# Patient Record
Sex: Female | Born: 1945
Health system: Southern US, Community
[De-identification: ages and names within clinical notes are randomized; demographics above are authoritative.]

## PROBLEM LIST (undated history)

## (undated) DIAGNOSIS — M1990 Unspecified osteoarthritis, unspecified site: Secondary | ICD-10-CM

## (undated) DIAGNOSIS — R112 Nausea with vomiting, unspecified: Secondary | ICD-10-CM

## (undated) DIAGNOSIS — C189 Malignant neoplasm of colon, unspecified: Secondary | ICD-10-CM

## (undated) DIAGNOSIS — Z9889 Other specified postprocedural states: Secondary | ICD-10-CM

## (undated) DIAGNOSIS — M5432 Sciatica, left side: Secondary | ICD-10-CM

## (undated) DIAGNOSIS — M5126 Other intervertebral disc displacement, lumbar region: Secondary | ICD-10-CM

## (undated) HISTORY — DX: Malignant neoplasm of colon, unspecified: C18.9

## (undated) HISTORY — PX: OTHER SURGICAL HISTORY: SHX169

## (undated) HISTORY — DX: Other intervertebral disc displacement, lumbar region: M51.26

## (undated) HISTORY — DX: Sciatica, left side: M54.32

---

## 1999-06-24 ENCOUNTER — Other Ambulatory Visit: Admission: RE | Admit: 1999-06-24 | Discharge: 1999-06-24 | Payer: Self-pay | Admitting: Family Medicine

## 1999-10-31 ENCOUNTER — Encounter: Payer: Self-pay | Admitting: Family Medicine

## 1999-10-31 ENCOUNTER — Encounter: Admission: RE | Admit: 1999-10-31 | Discharge: 1999-10-31 | Payer: Self-pay | Admitting: Family Medicine

## 2002-12-15 ENCOUNTER — Encounter: Payer: Self-pay | Admitting: Family Medicine

## 2002-12-15 ENCOUNTER — Ambulatory Visit (HOSPITAL_COMMUNITY): Admission: RE | Admit: 2002-12-15 | Discharge: 2002-12-15 | Payer: Self-pay | Admitting: Family Medicine

## 2004-01-04 ENCOUNTER — Ambulatory Visit (HOSPITAL_COMMUNITY): Admission: RE | Admit: 2004-01-04 | Discharge: 2004-01-04 | Payer: Self-pay | Admitting: Family Medicine

## 2007-02-13 ENCOUNTER — Ambulatory Visit: Payer: Self-pay | Admitting: Orthopedic Surgery

## 2007-02-19 ENCOUNTER — Encounter (HOSPITAL_COMMUNITY): Admission: RE | Admit: 2007-02-19 | Discharge: 2007-03-21 | Payer: Self-pay | Admitting: Orthopedic Surgery

## 2007-03-06 ENCOUNTER — Ambulatory Visit: Payer: Self-pay | Admitting: Orthopedic Surgery

## 2007-03-22 ENCOUNTER — Encounter (HOSPITAL_COMMUNITY): Admission: RE | Admit: 2007-03-22 | Discharge: 2007-04-09 | Payer: Self-pay | Admitting: Orthopedic Surgery

## 2011-12-21 DIAGNOSIS — H2510 Age-related nuclear cataract, unspecified eye: Secondary | ICD-10-CM | POA: Diagnosis not present

## 2012-08-27 DIAGNOSIS — R110 Nausea: Secondary | ICD-10-CM | POA: Diagnosis not present

## 2012-08-27 DIAGNOSIS — Z79899 Other long term (current) drug therapy: Secondary | ICD-10-CM | POA: Diagnosis not present

## 2012-08-27 DIAGNOSIS — S199XXA Unspecified injury of neck, initial encounter: Secondary | ICD-10-CM | POA: Diagnosis not present

## 2012-08-27 DIAGNOSIS — R51 Headache: Secondary | ICD-10-CM | POA: Diagnosis not present

## 2012-08-27 DIAGNOSIS — S0990XA Unspecified injury of head, initial encounter: Secondary | ICD-10-CM | POA: Diagnosis not present

## 2013-07-22 DIAGNOSIS — H10509 Unspecified blepharoconjunctivitis, unspecified eye: Secondary | ICD-10-CM | POA: Diagnosis not present

## 2013-07-22 DIAGNOSIS — H2510 Age-related nuclear cataract, unspecified eye: Secondary | ICD-10-CM | POA: Diagnosis not present

## 2013-11-11 ENCOUNTER — Other Ambulatory Visit (HOSPITAL_COMMUNITY): Payer: Self-pay | Admitting: Internal Medicine

## 2013-11-11 DIAGNOSIS — R109 Unspecified abdominal pain: Secondary | ICD-10-CM | POA: Diagnosis not present

## 2013-11-11 DIAGNOSIS — R1032 Left lower quadrant pain: Secondary | ICD-10-CM | POA: Diagnosis not present

## 2013-11-11 DIAGNOSIS — Z79899 Other long term (current) drug therapy: Secondary | ICD-10-CM | POA: Diagnosis not present

## 2013-11-11 DIAGNOSIS — R3129 Other microscopic hematuria: Secondary | ICD-10-CM | POA: Diagnosis not present

## 2013-11-11 DIAGNOSIS — Z0000 Encounter for general adult medical examination without abnormal findings: Secondary | ICD-10-CM

## 2013-11-11 DIAGNOSIS — Z6827 Body mass index (BMI) 27.0-27.9, adult: Secondary | ICD-10-CM | POA: Diagnosis not present

## 2013-11-13 ENCOUNTER — Other Ambulatory Visit (HOSPITAL_COMMUNITY): Payer: Self-pay | Admitting: Internal Medicine

## 2013-11-13 DIAGNOSIS — M810 Age-related osteoporosis without current pathological fracture: Secondary | ICD-10-CM

## 2013-11-18 ENCOUNTER — Ambulatory Visit (HOSPITAL_COMMUNITY)
Admission: RE | Admit: 2013-11-18 | Discharge: 2013-11-18 | Disposition: A | Discharge status: Home / Self Care | Payer: Medicare Other | Source: Ambulatory Visit | Attending: Internal Medicine | Admitting: Internal Medicine

## 2013-11-18 DIAGNOSIS — M810 Age-related osteoporosis without current pathological fracture: Secondary | ICD-10-CM

## 2013-11-18 DIAGNOSIS — Z780 Asymptomatic menopausal state: Secondary | ICD-10-CM | POA: Diagnosis not present

## 2013-11-18 DIAGNOSIS — Z13820 Encounter for screening for osteoporosis: Secondary | ICD-10-CM | POA: Diagnosis not present

## 2013-11-19 DIAGNOSIS — H6120 Impacted cerumen, unspecified ear: Secondary | ICD-10-CM | POA: Diagnosis not present

## 2013-11-26 ENCOUNTER — Other Ambulatory Visit (HOSPITAL_COMMUNITY): Payer: Self-pay | Admitting: Internal Medicine

## 2013-11-26 DIAGNOSIS — R1032 Left lower quadrant pain: Secondary | ICD-10-CM

## 2013-11-26 DIAGNOSIS — R109 Unspecified abdominal pain: Secondary | ICD-10-CM

## 2013-11-26 DIAGNOSIS — R194 Change in bowel habit: Secondary | ICD-10-CM

## 2013-11-28 ENCOUNTER — Encounter (INDEPENDENT_AMBULATORY_CARE_PROVIDER_SITE_OTHER): Payer: Self-pay | Admitting: *Deleted

## 2013-11-28 ENCOUNTER — Other Ambulatory Visit (HOSPITAL_COMMUNITY): Payer: Medicare Other

## 2013-12-02 ENCOUNTER — Ambulatory Visit (HOSPITAL_COMMUNITY)
Admission: RE | Admit: 2013-12-02 | Discharge: 2013-12-02 | Disposition: A | Discharge status: Home / Self Care | Payer: Medicare Other | Source: Ambulatory Visit | Attending: Internal Medicine | Admitting: Internal Medicine

## 2013-12-02 ENCOUNTER — Other Ambulatory Visit (HOSPITAL_COMMUNITY): Payer: Self-pay | Admitting: Internal Medicine

## 2013-12-02 ENCOUNTER — Encounter (HOSPITAL_COMMUNITY): Payer: Self-pay

## 2013-12-02 DIAGNOSIS — N289 Disorder of kidney and ureter, unspecified: Secondary | ICD-10-CM

## 2013-12-02 DIAGNOSIS — R1032 Left lower quadrant pain: Secondary | ICD-10-CM

## 2013-12-02 DIAGNOSIS — Q676 Pectus excavatum: Secondary | ICD-10-CM | POA: Diagnosis not present

## 2013-12-02 DIAGNOSIS — R194 Change in bowel habit: Secondary | ICD-10-CM

## 2013-12-02 DIAGNOSIS — K8689 Other specified diseases of pancreas: Secondary | ICD-10-CM

## 2013-12-02 DIAGNOSIS — R109 Unspecified abdominal pain: Secondary | ICD-10-CM

## 2013-12-02 DIAGNOSIS — R911 Solitary pulmonary nodule: Secondary | ICD-10-CM | POA: Insufficient documentation

## 2013-12-02 DIAGNOSIS — N2889 Other specified disorders of kidney and ureter: Secondary | ICD-10-CM | POA: Diagnosis not present

## 2013-12-02 MED ORDER — IOHEXOL 300 MG/ML  SOLN
100.0000 mL | Freq: Once | INTRAMUSCULAR | Status: AC | PRN
  Administered 2013-12-02: 100 mL via INTRAVENOUS

## 2013-12-05 ENCOUNTER — Encounter (HOSPITAL_COMMUNITY): Payer: Self-pay

## 2013-12-05 ENCOUNTER — Ambulatory Visit (HOSPITAL_COMMUNITY)
Admission: RE | Admit: 2013-12-05 | Discharge: 2013-12-05 | Disposition: A | Discharge status: Home / Self Care | Payer: Medicare Other | Source: Ambulatory Visit | Attending: Internal Medicine | Admitting: Internal Medicine

## 2013-12-05 DIAGNOSIS — K862 Cyst of pancreas: Secondary | ICD-10-CM | POA: Diagnosis not present

## 2013-12-05 DIAGNOSIS — N289 Disorder of kidney and ureter, unspecified: Secondary | ICD-10-CM | POA: Diagnosis not present

## 2013-12-05 DIAGNOSIS — K8689 Other specified diseases of pancreas: Secondary | ICD-10-CM | POA: Diagnosis not present

## 2013-12-05 DIAGNOSIS — K863 Pseudocyst of pancreas: Secondary | ICD-10-CM | POA: Diagnosis not present

## 2013-12-05 DIAGNOSIS — K869 Disease of pancreas, unspecified: Secondary | ICD-10-CM | POA: Diagnosis not present

## 2013-12-05 MED ORDER — GADOBENATE DIMEGLUMINE 529 MG/ML IV SOLN
15.0000 mL | Freq: Once | INTRAVENOUS | Status: AC | PRN
  Administered 2013-12-05: 15 mL via INTRAVENOUS

## 2013-12-08 ENCOUNTER — Encounter (HOSPITAL_COMMUNITY): Payer: Self-pay | Admitting: Pharmacy Technician

## 2013-12-08 ENCOUNTER — Ambulatory Visit (INDEPENDENT_AMBULATORY_CARE_PROVIDER_SITE_OTHER): Payer: Medicare Other | Admitting: Internal Medicine

## 2013-12-08 ENCOUNTER — Telehealth (INDEPENDENT_AMBULATORY_CARE_PROVIDER_SITE_OTHER): Payer: Self-pay | Admitting: *Deleted

## 2013-12-08 ENCOUNTER — Other Ambulatory Visit (INDEPENDENT_AMBULATORY_CARE_PROVIDER_SITE_OTHER): Payer: Self-pay | Admitting: *Deleted

## 2013-12-08 ENCOUNTER — Encounter (INDEPENDENT_AMBULATORY_CARE_PROVIDER_SITE_OTHER): Payer: Self-pay | Admitting: Internal Medicine

## 2013-12-08 VITALS — BP 136/62 | HR 72 | Temp 98.0°F | Ht 64.0 in | Wt 160.4 lb

## 2013-12-08 DIAGNOSIS — K869 Disease of pancreas, unspecified: Secondary | ICD-10-CM | POA: Diagnosis not present

## 2013-12-08 DIAGNOSIS — K8689 Other specified diseases of pancreas: Secondary | ICD-10-CM | POA: Insufficient documentation

## 2013-12-08 DIAGNOSIS — K6389 Other specified diseases of intestine: Secondary | ICD-10-CM | POA: Diagnosis not present

## 2013-12-08 MED ORDER — PEG 3350-KCL-NA BICARB-NACL 420 G PO SOLR: 4000.0000 mL | Freq: Once | ORAL | Status: DC

## 2013-12-08 NOTE — Patient Instructions (Signed)
Colonoscopy and EUS with FNA.    The risk of the colonoscopy were reviewed with patient and she verbalizes and understanding.

## 2013-12-08 NOTE — Progress Notes (Addendum)
Subjective:     Patient ID: Amanda Norris, female   DOB: 1945/11/10, 68 y.o.   MRN: [id number]  HPI Referred to our office by Dr. Gerarda Fraction [facility]) for change in stool and abnormal CT of the abdomen. She tells me she had a change in bowel habits. She has not been regular.  She sometimes had a hard time having a BM and sometimes she will have diarrhea. She thinks she has lost about 8 pounds over the past 6 weeks. She has not seen any rectal bleeding.  Appetite is okay. She says she doesn't eat as much as she use to because of the bloating. She has never undergone a colonoscopy. No family hx of colon cancer.  11/26/2013 CT abdomen/pelvis with CM: IMPRESSION:  Area concerning for neoplasm in the proximal transverse colon. In  this area, there is focal wall thickening with what appears to be  some mucosal disruption. This area measures 6.2 x 4.3 cm in length.  Direct visualization is strongly advised. Note that there is some  subtle mesenteric thickening in this area; spread of tumor to the  immediate adjacent mesenteric region is suspected.  Questionable mass versus inflammation in the head of the pancreas.  MR of the pancreas with and without intravenous contrast advised to  further evaluate this region.  Subcentimeter mass in mid right kidney. This area should also be  evaluated at the time of MR of the pancreas with without intravenous  contrast material administration.  Small nodular lesions in the lung bases. Followup of these nodular  opacity should be based on Fleischner Society guidelines. If the  patient is at high risk for bronchogenic carcinoma, follow-up chest  CT at 1 year is recommended. If the patient is at low risk, no  follow-up is needed. This recommendation follows the consensus  statement: Guidelines for Management of Small Pulmonary Nodules  Detected on CT Scans: A Statement from the [place] as  published in Radiology 2005; 202:542-706.    12/02/2013  MR abdomen w/wo CM: IMPRESSION:  18 x 9 mm cystic lesion in the posterior pancreatic head appears to  communicate with the main pancreatic duct. Side branch IPMN would be  a distinct consideration. Immediately anterior to this nonenhancing  cystic lesion is a 10 x 23 mm subtle area of altered signal and hypo  enhancement within the pancreatic head. . If the patient is to have  PET-CT for the colonic lesion, attention to this area on PET imaging  would be recommended. If the patient is not to have PET imaging, the  EUS could be considered. Alternatively, followup MRI in 3 months  could be used to assess stability.  11/11/2013 H and H 12.5 and 38.8, MCV 80.3, platelets 266. ALP 81, AST 20, ALT 12, Biulirubin ,total 0.4. Review of Systems History reviewed. No pertinent past medical history.  Past Surgical History  Procedure Laterality Date  . Herniated disc      1996    Allergies  Allergen Reactions  . Sulfa Antibiotics     No current outpatient prescriptions on file prior to visit.   No current facility-administered medications on file prior to visit.   Married, Three children inin good health.     Objective:   Physical Exam  Filed Vitals:   12/08/13 0922  BP: 136/62  Pulse: 72  Temp: 98 F (36.7 C)  Height: 5\' 4"  (1.626 m)  Weight: 160 lb 6.4 oz (72.757 kg)   Alert and oriented. Skin warm  and dry. Oral mucosa is moist.   . Sclera anicteric, conjunctivae is pink. Thyroid not enlarged. No cervical lymphadenopathy. Lungs clear. Heart regular rate and rhythm.  Abdomen is soft. Bowel sounds are positive. No hepatomegaly. No abdominal masses felt Slight  Tenderness mid-abdomen.  No edema to lower extremities.       Assessment:    Colonic mass. Colonic neoplasm needs to be ruled. Cystic lesion in pancreatic head. Pancreatic cancer needs to be ruled out. Dr. Laural Golden in with patient.     Plan:     Colonoscopy this week. EUS with FNA with Dr. Ardis Hughs.

## 2013-12-08 NOTE — Telephone Encounter (Signed)
Patient needs trilyte 

## 2013-12-09 ENCOUNTER — Telehealth: Payer: Self-pay

## 2013-12-09 DIAGNOSIS — R932 Abnormal findings on diagnostic imaging of liver and biliary tract: Secondary | ICD-10-CM

## 2013-12-09 NOTE — Telephone Encounter (Signed)
12/18/13 

## 2013-12-09 NOTE — Telephone Encounter (Signed)
Message copied by Barron Alvine on Tue Dec 09, 2013  3:03 PM ------      Message from: Milus Banister      Created: Tue Dec 09, 2013  2:48 PM      Regarding: RE: EUS w/ FNA       Monasia Lair,            She needs upper EUS, radial +/- linear, next Thursday ++MAC for abnormal pancreas.            Can you also have patient get labs (here or at [facility]); CEA and CA 19-9. This week.            Thanks                        ----- Message -----         From: Barron Alvine, CMA         Sent: 12/09/2013   8:23 AM           To: Milus Banister, MD      Subject: FW: EUS w/ FNA                                                       ----- Message -----         From: Larina Bras, CMA         Sent: 12/09/2013           To: Barron Alvine, CMA      Subject: FW: EUS w/ FNA                                                       ----- Message -----         From: Worthy Keeler         Sent: 12/08/2013   9:51 AM           To: Barron Alvine, CMA      Subject: EUS w/ FNA                                               Dr Laural Golden wants patient to have EUS w/ FNA next week, pancreatic mass. Her call# is best number to reach her at: [phone]            ThanksLelon Frohlich       ------

## 2013-12-10 ENCOUNTER — Other Ambulatory Visit: Payer: Self-pay

## 2013-12-10 ENCOUNTER — Encounter (HOSPITAL_COMMUNITY): Payer: Self-pay | Admitting: Pharmacy Technician

## 2013-12-10 ENCOUNTER — Encounter (HOSPITAL_COMMUNITY): Payer: Self-pay | Admitting: *Deleted

## 2013-12-10 DIAGNOSIS — R932 Abnormal findings on diagnostic imaging of liver and biliary tract: Secondary | ICD-10-CM

## 2013-12-10 NOTE — Telephone Encounter (Signed)
Spoke with pt and she is aware.

## 2013-12-10 NOTE — Telephone Encounter (Signed)
EUS for 12/18/13 [facility] 1215 pm , labs are in EPIC as well Left message on machine to call back

## 2013-12-11 ENCOUNTER — Other Ambulatory Visit: Payer: Medicare Other

## 2013-12-11 DIAGNOSIS — R932 Abnormal findings on diagnostic imaging of liver and biliary tract: Secondary | ICD-10-CM | POA: Diagnosis not present

## 2013-12-12 ENCOUNTER — Encounter (HOSPITAL_COMMUNITY): Payer: Self-pay

## 2013-12-12 ENCOUNTER — Ambulatory Visit (HOSPITAL_COMMUNITY)
Admission: RE | Admit: 2013-12-12 | Discharge: 2013-12-12 | Disposition: A | Discharge status: Home / Self Care | Payer: Medicare Other | Source: Ambulatory Visit | Attending: Internal Medicine | Admitting: Internal Medicine

## 2013-12-12 ENCOUNTER — Encounter (HOSPITAL_COMMUNITY)
Admission: RE | Disposition: A | Discharge status: Home / Self Care | Payer: Self-pay | Source: Ambulatory Visit | Attending: Internal Medicine

## 2013-12-12 DIAGNOSIS — D126 Benign neoplasm of colon, unspecified: Secondary | ICD-10-CM | POA: Insufficient documentation

## 2013-12-12 DIAGNOSIS — R933 Abnormal findings on diagnostic imaging of other parts of digestive tract: Secondary | ICD-10-CM | POA: Diagnosis not present

## 2013-12-12 DIAGNOSIS — Z87891 Personal history of nicotine dependence: Secondary | ICD-10-CM | POA: Insufficient documentation

## 2013-12-12 DIAGNOSIS — Z79899 Other long term (current) drug therapy: Secondary | ICD-10-CM | POA: Diagnosis not present

## 2013-12-12 DIAGNOSIS — Z882 Allergy status to sulfonamides status: Secondary | ICD-10-CM | POA: Insufficient documentation

## 2013-12-12 DIAGNOSIS — K5730 Diverticulosis of large intestine without perforation or abscess without bleeding: Secondary | ICD-10-CM | POA: Diagnosis not present

## 2013-12-12 DIAGNOSIS — M129 Arthropathy, unspecified: Secondary | ICD-10-CM | POA: Diagnosis not present

## 2013-12-12 DIAGNOSIS — C184 Malignant neoplasm of transverse colon: Secondary | ICD-10-CM | POA: Insufficient documentation

## 2013-12-12 DIAGNOSIS — K6389 Other specified diseases of intestine: Secondary | ICD-10-CM

## 2013-12-12 DIAGNOSIS — R198 Other specified symptoms and signs involving the digestive system and abdomen: Secondary | ICD-10-CM

## 2013-12-12 HISTORY — PX: COLONOSCOPY: SHX5424

## 2013-12-12 LAB — CEA: CEA: 3.8 ng/mL (ref 0.0–5.0)

## 2013-12-12 LAB — CANCER ANTIGEN 19-9: CA 19-9: 20.6 U/mL (ref <35.0)

## 2013-12-12 SURGERY — COLONOSCOPY
Anesthesia: Moderate Sedation

## 2013-12-12 MED ORDER — MIDAZOLAM HCL 5 MG/5ML IJ SOLN
INTRAMUSCULAR | Status: AC
  Filled 2013-12-12: qty 10

## 2013-12-12 MED ORDER — STERILE WATER FOR IRRIGATION IR SOLN
Status: DC | PRN
  Administered 2013-12-12: 08:00:00

## 2013-12-12 MED ORDER — MEPERIDINE HCL 50 MG/ML IJ SOLN
INTRAMUSCULAR | Status: DC | PRN
  Administered 2013-12-12 (×2): 25 mg via INTRAVENOUS

## 2013-12-12 MED ORDER — SODIUM CHLORIDE 0.9 % IV SOLN
INTRAVENOUS | Status: DC
  Administered 2013-12-12: 07:00:00 via INTRAVENOUS

## 2013-12-12 MED ORDER — MEPERIDINE HCL 50 MG/ML IJ SOLN
INTRAMUSCULAR | Status: AC
  Filled 2013-12-12: qty 1

## 2013-12-12 MED ORDER — MIDAZOLAM HCL 5 MG/5ML IJ SOLN
INTRAMUSCULAR | Status: DC | PRN
  Administered 2013-12-12 (×4): 2 mg via INTRAVENOUS

## 2013-12-12 NOTE — H&P (Signed)
Amanda Norris is an 68 y.o. female.   Chief Complaint: Amanda Norris is here for colonoscopy. HPI: Amanda Norris is 68 year old Caucasian female who was recently evaluated for change in her bowel habits as well as bloating with abdominal pelvic CT. She has soft tissue density in proximal transverse colon suspicious for neoplasm. Study also revealed two small cystic lesion pancreas. These lesions were also evaluated with MRCP and now she is scheduled to undergo EUS next week. Amanda Norris states her symptoms began about a month ago. Postprandially she will feels bloated. She also has either constipation and or diarrhea. No history of rectal bleeding exception noted blood tinged liquid when she took prep yesterday. She has lost about 6 pounds since her symptoms began. Her H&H and transaminases are normal. Family history is negative for CRC.  Past Medical History  Diagnosis Date  . Arthritis   . PONV (postoperative nausea and vomiting)     Past Surgical History  Procedure Laterality Date  . Herniated disc      1996    History reviewed. No pertinent family history. Social History:  reports that she quit smoking about 54 years ago. Her smoking use included Cigarettes. She has a 1.25 pack-year smoking history. She does not have any smokeless tobacco history on file. She reports that she does not drink alcohol or use illicit drugs.  Allergies:  Allergies  Allergen Reactions  . Sulfa Antibiotics Other (See Comments)    "Sores in my mouth"    Medications Prior to Admission  Medication Sig Dispense Refill  . BLACK COHOSH EXTRACT PO Take 2 tablets by mouth daily.       Marland Kitchen CRANBERRY PO Take 2 tablets by mouth daily.      . Garlic 3419 MG CAPS Take 1,000 mg by mouth daily.       Marland Kitchen ibuprofen (ADVIL,MOTRIN) 200 MG tablet Take 400 mg by mouth daily as needed for moderate pain.      . Lactobacillus (ACIDOPHILUS PO) Take 1 tablet by mouth daily.       Marland Kitchen loratadine (CLARITIN) 10 MG tablet Take 10 mg by mouth daily.       . Multiple Vitamin (MULTIVITAMIN) tablet Take 1 tablet by mouth daily.      . Omega-3 Fatty Acids (FISH OIL) 1200 MG CAPS Take 2,400 mg by mouth daily.       . polyethylene glycol-electrolytes (NULYTELY/GOLYTELY) 420 G solution Take 4,000 mLs by mouth once.  4000 mL  0    Results for orders placed in visit on 12/11/13 (from the past 48 hour(s))  CANCER ANTIGEN 19-9     Status: None   Collection Time    12/11/13  8:18 AM      Result Value Ref Range   CA 19-9 20.6  <35.0 U/mL  CEA     Status: None   Collection Time    12/11/13  8:18 AM      Result Value Ref Range   CEA 3.8  0.0 - 5.0 ng/mL   No results found.  ROS  Blood pressure 148/72, pulse 84, temperature 98.5 F (36.9 C), temperature source Oral, resp. rate 18, height 5\' 4"  (1.626 m), weight 160 lb (72.576 kg), SpO2 97.00%. Physical Exam  Constitutional: She appears well-developed and well-nourished.  HENT:  Mouth/Throat: Oropharynx is clear and moist.  Eyes: Conjunctivae are normal. No scleral icterus.  Neck: Normal range of motion. No thyromegaly present.  Cardiovascular: Normal rate, regular rhythm and normal heart sounds.   Respiratory: Effort  normal and breath sounds normal.  GI: Soft. She exhibits no distension and no mass. There is no tenderness.  Musculoskeletal: She exhibits no edema.  Lymphadenopathy:    She has no cervical adenopathy.  Neurological: She is alert.  Skin: Skin is warm and dry.     Assessment/Plan Change in bowel habit habits. Abnormal CT with soft tissue density involving proximal transverse colon  Billal Rollo U Opie Fanton 12/12/2013, 7:32 AM

## 2013-12-12 NOTE — Op Note (Signed)
COLONOSCOPY PROCEDURE REPORT  PATIENT:  Amanda Norris  MR#:  [id number] Birthdate:  [birth year], 68 y.o., female Endoscopist:  Dr. Rogene Houston, MD Referred By:  Dr. Sherrilee Gilles. Gerarda Fraction, MD  Procedure Date: 12/12/2013  Procedure:   Colonoscopy  Indications:  Patient is 68 year old Caucasian female who was noted change in her bowel habits and postprandial abdominal bloating. Abdominopelvic CT reveals mass in region of proximal transverse colon. CT also revealed two cystic lesion in the pancreas to be further evaluated with EUS next week.  Informed Consent:  The procedure and risks were reviewed with the patient and informed consent was obtained.  Medications:  Demerol 50 mg IV Versed 8 mg IV  Description of procedure:  After a digital rectal exam was performed, that colonoscope was advanced from the anus through the rectum and colon to the region of proximal transverse colon were circumferential mass was identified with luminal narrowing and scope could not be advanced further. As the scope was withdrawn mucosal surfaces were carefully surveyed utilizing scope tip to flexion to facilitate fold flattening as needed. The scope was pulled down into the rectum where a thorough exam including retroflexion was performed.  Findings:   Prep excellent. Large circumferential mass noted in proximal transverse colon with luminal compromise. Scope could not be advanced proximal to that. Multiple biopsies were taken using jumbo biopsy forceps. 10 mm polyp noted about 9-10 cm distal to this lesion. It was biopsied for histology. Few scattered diverticula noted at transverse, descending and sigmoid colon. Normal rectal mucosa and anal rectal junction.   Therapeutic/Diagnostic Maneuvers Performed:  See above  Complications:  None  Cecal Withdrawal Time: NA  Impression:  Large circumferential ulcerated mass at proximal transverse colon with luminal compromise not allowing passage of scope above it.  Endoscopic appearance consistent with adenocarcinoma. Multiple biopsies taken. 10 mm polyp distal to transverse colon. Biopsy taken. Few diverticula noted in the segments that were examined.  Recommendations:  Standard instructions given. I will contact patient with biopsy results and further recommendations.  Rogene Houston  12/12/2013 8:12 AM  CC: Dr. Glo Herring., MD & Dr. Rayne Du ref. provider found

## 2013-12-12 NOTE — Discharge Instructions (Signed)
Resume usual medications and diet. Colace 200 mg by mouth daily at bedtime. No driving for 24 hours. Physician will call with biopsy results.  Colonoscopy, Care After Refer to this sheet in the next few weeks. These instructions provide you with information on caring for yourself after your procedure. Your health care provider may also give you more specific instructions. Your treatment has been planned according to current medical practices, but problems sometimes occur. Call your health care provider if you have any problems or questions after your procedure. WHAT TO EXPECT AFTER THE PROCEDURE  After your procedure, it is typical to have the following:  A small amount of blood in your stool.  Moderate amounts of gas and mild abdominal cramping or bloating. HOME CARE INSTRUCTIONS  Do not drive, operate machinery, or sign important documents for 24 hours.  You may shower and resume your regular physical activities, but move at a slower pace for the first 24 hours.  Take frequent rest periods for the first 24 hours.  Walk around or put a warm pack on your abdomen to help reduce abdominal cramping and bloating.  Drink enough fluids to keep your urine clear or pale yellow.  You may resume your normal diet as instructed by your health care provider. Avoid heavy or fried foods that are hard to digest.  Avoid drinking alcohol for 24 hours or as instructed by your health care provider.  Only take over-the-counter or prescription medicines as directed by your health care provider.  If a tissue sample (biopsy) was taken during your procedure:  Do not take aspirin or blood thinners for 7 days, or as instructed by your health care provider.  Do not drink alcohol for 7 days, or as instructed by your health care provider.  Eat soft foods for the first 24 hours. SEEK MEDICAL CARE IF: You have persistent spotting of blood in your stool 2 3 days after the procedure. SEEK IMMEDIATE MEDICAL  CARE IF:  You have more than a small spotting of blood in your stool.  You pass large blood clots in your stool.  Your abdomen is swollen (distended).  You have nausea or vomiting.  You have a fever.  You have increasing abdominal pain that is not relieved with medicine.

## 2013-12-16 ENCOUNTER — Encounter (HOSPITAL_COMMUNITY): Payer: Self-pay | Admitting: Internal Medicine

## 2013-12-18 ENCOUNTER — Encounter (HOSPITAL_COMMUNITY): Payer: Self-pay | Admitting: *Deleted

## 2013-12-18 ENCOUNTER — Encounter (HOSPITAL_COMMUNITY)
Admission: RE | Disposition: A | Discharge status: Home / Self Care | Payer: Self-pay | Source: Ambulatory Visit | Attending: Gastroenterology

## 2013-12-18 ENCOUNTER — Encounter (HOSPITAL_COMMUNITY): Payer: Medicare Other | Admitting: Anesthesiology

## 2013-12-18 ENCOUNTER — Ambulatory Visit (HOSPITAL_COMMUNITY): Payer: Medicare Other | Admitting: Anesthesiology

## 2013-12-18 ENCOUNTER — Ambulatory Visit (HOSPITAL_COMMUNITY)
Admission: RE | Admit: 2013-12-18 | Discharge: 2013-12-18 | Disposition: A | Discharge status: Home / Self Care | Payer: Medicare Other | Source: Ambulatory Visit | Attending: Gastroenterology | Admitting: Gastroenterology

## 2013-12-18 DIAGNOSIS — M129 Arthropathy, unspecified: Secondary | ICD-10-CM | POA: Diagnosis not present

## 2013-12-18 DIAGNOSIS — K862 Cyst of pancreas: Secondary | ICD-10-CM | POA: Insufficient documentation

## 2013-12-18 DIAGNOSIS — Z79899 Other long term (current) drug therapy: Secondary | ICD-10-CM | POA: Diagnosis not present

## 2013-12-18 DIAGNOSIS — Z87891 Personal history of nicotine dependence: Secondary | ICD-10-CM | POA: Diagnosis not present

## 2013-12-18 DIAGNOSIS — K863 Pseudocyst of pancreas: Secondary | ICD-10-CM | POA: Diagnosis not present

## 2013-12-18 DIAGNOSIS — R932 Abnormal findings on diagnostic imaging of liver and biliary tract: Secondary | ICD-10-CM | POA: Diagnosis not present

## 2013-12-18 DIAGNOSIS — C184 Malignant neoplasm of transverse colon: Secondary | ICD-10-CM | POA: Diagnosis not present

## 2013-12-18 HISTORY — PX: EUS: SHX5427

## 2013-12-18 HISTORY — DX: Other specified postprocedural states: Z98.890

## 2013-12-18 HISTORY — DX: Nausea with vomiting, unspecified: R11.2

## 2013-12-18 HISTORY — DX: Unspecified osteoarthritis, unspecified site: M19.90

## 2013-12-18 SURGERY — UPPER ENDOSCOPIC ULTRASOUND (EUS) LINEAR
Anesthesia: Monitor Anesthesia Care

## 2013-12-18 MED ORDER — SODIUM CHLORIDE 0.9 % IV SOLN: INTRAVENOUS | Status: DC

## 2013-12-18 MED ORDER — PROPOFOL INFUSION 10 MG/ML OPTIME
INTRAVENOUS | Status: DC | PRN
  Administered 2013-12-18: 120 ug/kg/min via INTRAVENOUS

## 2013-12-18 MED ORDER — PROPOFOL 10 MG/ML IV BOLUS
INTRAVENOUS | Status: AC
  Filled 2013-12-18: qty 20

## 2013-12-18 MED ORDER — MIDAZOLAM HCL 2 MG/2ML IJ SOLN
INTRAMUSCULAR | Status: AC
  Filled 2013-12-18: qty 2

## 2013-12-18 MED ORDER — LIDOCAINE HCL 1 % IJ SOLN
INTRAMUSCULAR | Status: DC | PRN
  Administered 2013-12-18: 50 mg via INTRADERMAL

## 2013-12-18 MED ORDER — MIDAZOLAM HCL 5 MG/5ML IJ SOLN
INTRAMUSCULAR | Status: DC | PRN
  Administered 2013-12-18 (×2): 1 mg via INTRAVENOUS

## 2013-12-18 MED ORDER — BUTAMBEN-TETRACAINE-BENZOCAINE 2-2-14 % EX AERO
INHALATION_SPRAY | CUTANEOUS | Status: DC | PRN
  Administered 2013-12-18: 1 via TOPICAL

## 2013-12-18 MED ORDER — KETAMINE HCL 10 MG/ML IJ SOLN
INTRAMUSCULAR | Status: DC | PRN
  Administered 2013-12-18 (×2): 10 mg via INTRAVENOUS

## 2013-12-18 MED ORDER — LACTATED RINGERS IV SOLN
INTRAVENOUS | Status: DC
Start: 2013-12-18 — End: 2013-12-18
  Administered 2013-12-18: 1000 mL via INTRAVENOUS

## 2013-12-18 NOTE — Transfer of Care (Signed)
Immediate Anesthesia Transfer of Care Note  Patient: Amanda Norris  Procedure(s) Performed: Procedure(s): UPPER ENDOSCOPIC ULTRASOUND (EUS) LINEAR (N/A)  Patient Location: PACU and Endoscopy Unit  Anesthesia Type:MAC  Level of Consciousness: awake, alert , oriented and patient cooperative  Airway & Oxygen Therapy: Patient Spontanous Breathing and Patient connected to nasal cannula oxygen  Post-op Assessment: Report given to PACU RN, Post -op Vital signs reviewed and stable and Patient moving all extremities  Post vital signs: Reviewed and stable  Complications: No apparent anesthesia complications

## 2013-12-18 NOTE — Op Note (Signed)
[facility] [place], [address]   ENDOSCOPIC ULTRASOUND PROCEDURE REPORT  PATIENT: Amanda Norris, Amanda Norris  MR#: [id number] BIRTHDATE: Jun 29, 1946  GENDER: Female ENDOSCOPIST: Milus Banister, MD REFERRED BY:  Hildred Laser, M.D. PROCEDURE DATE:  12/18/2013 PROCEDURE:   Upper EUS ASA CLASS:      Class II INDICATIONS:   1.  recent CT scan showed large colon mass and also abnormal pancreas.  CEA and CA 19-9 were both normal.  Colonsocopy last week found transverse colon adenocarcinoma (Rehman). MEDICATIONS: MAC sedation, administered by CRNA  DESCRIPTION OF PROCEDURE:   After the risks benefits and alternatives of the procedure were  explained, informed consent was obtained. The patient was then placed in the left, lateral, decubitus postion and IV sedation was administered. Throughout the procedure, the patients blood pressure, pulse and oxygen saturations were monitored continuously.  Under direct visualization, the PENTAX EUS SCOPE  endoscope was introduced through the mouth  and advanced to the second portion of the duodenum .  Water was used as necessary to provide an acoustic interface.  Upon completion of the imaging, water was removed and the patient was sent to the recovery room in satisfactory condition.    Endoscopic findings: 1. Normal UGI tract  EUS findings: 1. Cystic lesion in head of pancreas, measuring 1.48cm maximally. There were thin internal septea but no associated solid masses or nodules.  The cyst did not clearly communicate with the main pancreatic duct. 2. The vague area in anterior pancreatic head described on recent CT was not visible on this exam; the pancreas, other than the cyst described above, was normal. 3. Main pancreatic duct was normal; non-dilated 4. No peripancreatic adenopathy 5. Limited views of liver, spleen, portal and splenic vessels were all normal.  Impression: 1.48cm cyst in pancreatic head without any  concerning morphologic features.  The pancreas was otherwise normal.  Per new AGA cyst surveillance guidelines, I recommend repeat imaging with MRI in 1 year.   _______________________________ eSigned:  Milus Banister, MD 12/18/2013 2:55 PM

## 2013-12-18 NOTE — Anesthesia Preprocedure Evaluation (Signed)
Anesthesia Evaluation  Patient identified by MRN, date of birth, ID band Patient awake    Reviewed: Allergy & Precautions, H&P , NPO status , Patient's Chart, lab work & pertinent test results  History of Anesthesia Complications (+) PONV  Airway Mallampati: II TM Distance: >3 FB Neck ROM: Full    Dental  (+) Teeth Intact, Dental Advisory Given   Pulmonary neg pulmonary ROS, former smoker,  breath sounds clear to auscultation  Pulmonary exam normal       Cardiovascular negative cardio ROS  Rhythm:Regular Rate:Normal     Neuro/Psych negative neurological ROS  negative psych ROS   GI/Hepatic Neg liver ROS, Colon mass   Endo/Other  negative endocrine ROS  Renal/GU negative Renal ROS  negative genitourinary   Musculoskeletal negative musculoskeletal ROS (+)   Abdominal   Peds  Hematology negative hematology ROS (+)   Anesthesia Other Findings   Reproductive/Obstetrics                           Anesthesia Physical Anesthesia Plan  ASA: II  Anesthesia Plan: MAC   Post-op Pain Management:    Induction: Intravenous  Airway Management Planned:   Additional Equipment:   Intra-op Plan:   Post-operative Plan:   Informed Consent: I have reviewed the patients History and Physical, chart, labs and discussed the procedure including the risks, benefits and alternatives for the proposed anesthesia with the patient or authorized representative who has indicated his/her understanding and acceptance.   Dental advisory given  Plan Discussed with: CRNA  Anesthesia Plan Comments:         Anesthesia Quick Evaluation

## 2013-12-18 NOTE — Interval H&P Note (Signed)
History and Physical Interval Note:  12/18/2013 1:44 PM  Amanda Norris  has presented today for surgery, with the diagnosis of Abnormal CT scan, pancreas or bile duct [793.3]  The various methods of treatment have been discussed with the patient and family. After consideration of risks, benefits and other options for treatment, the patient has consented to  Procedure(s): UPPER ENDOSCOPIC ULTRASOUND (EUS) LINEAR (N/A) as a surgical intervention .  The patient's history has been reviewed, patient examined, no change in status, stable for surgery.  I have reviewed the patient's chart and labs.  Questions were answered to the patient's satisfaction.     Milus Banister

## 2013-12-18 NOTE — H&P (View-Only) (Signed)
Amanda Norris is an 68 y.o. female.   Chief Complaint: Patient is here for colonoscopy. HPI: Patient is 68 year old Caucasian female who was recently evaluated for change in her bowel habits as well as bloating with abdominal pelvic CT. She has soft tissue density in proximal transverse colon suspicious for neoplasm. Study also revealed two small cystic lesion pancreas. These lesions were also evaluated with MRCP and now she is scheduled to undergo EUS next week. Patient states her symptoms began about a month ago. Postprandially she will feels bloated. She also has either constipation and or diarrhea. No history of rectal bleeding exception noted blood tinged liquid when she took prep yesterday. She has lost about 6 pounds since her symptoms began. Her H&H and transaminases are normal. Family history is negative for CRC.  Past Medical History  Diagnosis Date  . Arthritis   . PONV (postoperative nausea and vomiting)     Past Surgical History  Procedure Laterality Date  . Herniated disc      1996    History reviewed. No pertinent family history. Social History:  reports that she quit smoking about 54 years ago. Her smoking use included Cigarettes. She has a 1.25 pack-year smoking history. She does not have any smokeless tobacco history on file. She reports that she does not drink alcohol or use illicit drugs.  Allergies:  Allergies  Allergen Reactions  . Sulfa Antibiotics Other (See Comments)    "Sores in my mouth"    Medications Prior to Admission  Medication Sig Dispense Refill  . BLACK COHOSH EXTRACT PO Take 2 tablets by mouth daily.       Marland Kitchen CRANBERRY PO Take 2 tablets by mouth daily.      . Garlic 6712 MG CAPS Take 1,000 mg by mouth daily.       Marland Kitchen ibuprofen (ADVIL,MOTRIN) 200 MG tablet Take 400 mg by mouth daily as needed for moderate pain.      . Lactobacillus (ACIDOPHILUS PO) Take 1 tablet by mouth daily.       Marland Kitchen loratadine (CLARITIN) 10 MG tablet Take 10 mg by mouth daily.       . Multiple Vitamin (MULTIVITAMIN) tablet Take 1 tablet by mouth daily.      . Omega-3 Fatty Acids (FISH OIL) 1200 MG CAPS Take 2,400 mg by mouth daily.       . polyethylene glycol-electrolytes (NULYTELY/GOLYTELY) 420 G solution Take 4,000 mLs by mouth once.  4000 mL  0    Results for orders placed in visit on 12/11/13 (from the past 48 hour(s))  CANCER ANTIGEN 19-9     Status: None   Collection Time    12/11/13  8:18 AM      Result Value Ref Range   CA 19-9 20.6  <35.0 Norris/mL  CEA     Status: None   Collection Time    12/11/13  8:18 AM      Result Value Ref Range   CEA 3.8  0.0 - 5.0 ng/mL   No results found.  ROS  Blood pressure 148/72, pulse 84, temperature 98.5 F (36.9 C), temperature source Oral, resp. rate 18, height 5\' 4"  (1.626 m), weight 160 lb (72.576 kg), SpO2 97.00%. Physical Exam  Constitutional: She appears well-developed and well-nourished.  HENT:  Mouth/Throat: Oropharynx is clear and moist.  Eyes: Conjunctivae are normal. No scleral icterus.  Neck: Normal range of motion. No thyromegaly present.  Cardiovascular: Normal rate, regular rhythm and normal heart sounds.   Respiratory: Effort  normal and breath sounds normal.  GI: Soft. She exhibits no distension and no mass. There is no tenderness.  Musculoskeletal: She exhibits no edema.  Lymphadenopathy:    She has no cervical adenopathy.  Neurological: She is alert.  Skin: Skin is warm and dry.     Assessment/Plan Change in bowel habit habits. Abnormal CT with soft tissue density involving proximal transverse colon  Amanda Norris Amanda Norris 12/12/2013, 7:32 AM

## 2013-12-18 NOTE — Discharge Instructions (Signed)

## 2013-12-19 ENCOUNTER — Encounter (HOSPITAL_COMMUNITY): Payer: Self-pay | Admitting: Gastroenterology

## 2013-12-22 NOTE — Anesthesia Postprocedure Evaluation (Signed)
Anesthesia Post Note  Patient: Amanda Norris  Procedure(s) Performed: Procedure(s) (LRB): UPPER ENDOSCOPIC ULTRASOUND (EUS) LINEAR (N/A)  Anesthesia type: MAC  Patient location: PACU  Post pain: Pain level controlled  Post assessment: Post-op Vital signs reviewed  Last Vitals:  Filed Vitals:   12/18/13 1500  BP: 128/71  Pulse: 84  Temp:   Resp: 18    Post vital signs: Reviewed  Level of consciousness: sedated  Complications: No apparent anesthesia complications

## 2013-12-23 ENCOUNTER — Encounter (HOSPITAL_COMMUNITY): Payer: Self-pay | Admitting: Pharmacy Technician

## 2013-12-23 DIAGNOSIS — C189 Malignant neoplasm of colon, unspecified: Secondary | ICD-10-CM | POA: Diagnosis not present

## 2013-12-23 MED ORDER — PROMETHAZINE HCL 25 MG/ML IJ SOLN: 6.2500 mg | INTRAMUSCULAR | Status: DC | PRN

## 2013-12-23 MED ORDER — LACTATED RINGERS IV SOLN: INTRAVENOUS | Status: DC

## 2013-12-23 NOTE — Patient Instructions (Signed)
Amanda Norris  12/23/2013   Your procedure is scheduled on:  12/29/2013  Report to [facility] at  850  AM.  Call this number if you have problems the morning of surgery: [phone]   Remember:   Do not eat food or drink liquids after midnight.   Take these medicines the morning of surgery with A SIP OF WATER:  claritin   Do not wear jewelry, make-up or nail polish.  Do not wear lotions, powders, or perfumes.  Do not shave 48 hours prior to surgery. Men may shave face and neck.  Do not bring valuables to the hospital.  [facility] is not responsible  for any belongings or valuables.               Contacts, dentures or bridgework may not be worn into surgery.  Leave suitcase in the car. After surgery it may be brought to your room.  For patients admitted to the hospital, discharge time is determined by your treatment team.               Patients discharged the day of surgery will not be allowed to drive home.  Name and phone number of your driver: family  Special Instructions: Shower using CHG 2 nights before surgery and the night before surgery.  If you shower the day of surgery use CHG.  Use special wash - you have one bottle of CHG for all showers.  You should use approximately 1/3 of the bottle for each shower.   Please read over the following fact sheets that you were given: Pain Booklet, Coughing and Deep Breathing, Blood Transfusion Information, Surgical Site Infection Prevention, Anesthesia Post-op Instructions and Care and Recovery After Surgery Open Colectomy An open colectomy is surgery to take out part or all of the large intestine (colon). This procedure is used to treat several conditions, including:  Inflammation and infection of the colon (diverticulitis).  Tumors or masses in the colon.  Inflammatory bowel disease, such as Crohn disease or ulcerative colitis. Colectomy is an option when symptoms cannot be controlled with medicines.  Bleeding from the colon that  cannot be controlled by another method.  Blockage or obstruction of the colon. LET [name] Amanda Norris KNOW ABOUT:  Any allergies you have.  All medicines you are taking, including vitamins, herbs, eye drops, creams, and over-the-counter medicines.  Previous problems you or members of your family have had with the use of anesthetics.  Any blood disorders you have.  Previous surgeries you have had.  Medical conditions you have. RISKS AND COMPLICATIONS  Generally, this is a safe procedure. However, as with any procedure, complications can occur. Possible complications include:  An infection developing in the area where the surgery was done.  Problems with the incisions, including:  Bleeding from an incision.  The wound reopening.  Tissues from inside the abdomen bulging through the incision (hernia).  Bleeding inside the abdomen.  Reopening of the colon where it was stitched or stapled together. This is a serious complication. Another procedure may be needed to fix the problem.  Damage to other organs in the abdomen.  A blood clot forming in a vein and traveling to the lungs.  Future blockage of the small intestine from scar tissue. Another surgery may be needed to repair this. BEFORE THE PROCEDURE  Various tests may be done before the procedure. These may include:  Blood tests.  A test to check the heart's rhythm (electrocardiography).  A CT scan to get pictures of your abdomen.  Ask your health care [facility] about changing or stopping any regular medicines. You will need to stop taking aspirin and nonsteroidal anti-inflammatory drugs (NSAIDs) at least 5 days before the surgery. You will also need to stop taking any blood thinners and vitamin E.  You may be prescribed an oral bowel prep. This involves drinking a large amount of medicated liquid, starting the day before your surgery. The liquid will cause you to have multiple loose stools until your stool is  almost clear or light green. This cleans out your colon in preparation for the surgery.  Do not eat or drink anything else once you have started the bowel prep, unless your health care [facility] tells you it is safe to do so.  You may also be given antibiotic pills to clean out your colon of bacteria. Be sure to follow the directions carefully and take the medicine at the correct time.  Make plans to have someone drive you home after your hospital stay. Also arrange for someone to help you with activities during your recovery. PROCEDURE This surgery can take 2 4 hours.  Small monitors will be put on your body. They are used to check your heart, blood pressure, and oxygen level.  An IV access tube will be put into one of your veins. Medicine will be able to flow directly into your body through this IV tube.  You might be given a medicine to help you relax (sedative).  You will be given a medicine to make you sleep through the procedure (general anesthetic). A breathing tube may be placed into your lungs during the procedure.  A thin, flexible tube (catheter) will be placed into your bladder to collect urine.  A tube may be put in through your nose. It is called a nasogastric tube. It is used to remove stomach fluids after surgery until the intestines start working again.  Once you are asleep, the surgeon will make an incision in the abdomen.  Clamps or staples are put on both ends of the diseased part of the colon.  The part of the intestine between the clamps or staples is removed.  If possible, the ends of the healthy colon that remain will be stitched or stapled together to allow your body to expel waste (stool).  Sometimes, the remaining colon cannot be stitched back together. If this is the case, a colostomy is needed. For a colostomy:  An opening (stoma) to the outside of your body is made through the abdomen.  The end of the colon is brought to the opening. It is stitched to  the skin.  A bag is attached to the opening. Stool will drain into this bag. The bag is removable.  The colostomy can be temporary or permanent.  The incision from the colectomy will be closed with stitches or staples. AFTER THE PROCEDURE  You will stay in a recovery area until the anesthetic has worn off. Your blood pressure and pulse will be checked often. Then you will be taken to a hospital room.  You will continue to get fluids through the IV tube for a while. The IV tube will be taken out when the colon starts working again.  You will start on clear liquids and gradually go back to a normal diet.  You will be encouraged to cough and to take deep breaths to open your lungs and prevent pneumonia.  Some pain is normal after a colectomy. You  will be given pain medicine as needed.  You will be urged to get up and start walking within a day.  If you had a colostomy, your health care Meya Clutter will explain how it works and what you will need to do.  You will likely need to stay in the hospital for 3 7 days. Document Released: 04/23/2009 Document Revised: 04/16/2013 Document Reviewed: 02/05/2013 [facility] Patient Information 2014 [employer] Amanda. PATIENT INSTRUCTIONS POST-ANESTHESIA  IMMEDIATELY FOLLOWING SURGERY:  Do not drive or operate machinery for the first twenty four hours after surgery.  Do not make any important decisions for twenty four hours after surgery or while taking narcotic pain medications or sedatives.  If you develop intractable nausea and vomiting or a severe headache please notify your doctor immediately.  FOLLOW-UP:  Please make an appointment with your surgeon as instructed. You do not need to follow up with anesthesia unless specifically instructed to do so.  WOUND CARE INSTRUCTIONS (if applicable):  Keep a dry clean dressing on the anesthesia/puncture wound site if there is drainage.  Once the wound has quit draining you may leave it open to air.  Generally you  should leave the bandage intact for twenty four hours unless there is drainage.  If the epidural site drains for more than 36-48 hours please call the anesthesia department.  QUESTIONS?:  Please feel free to call your physician or the hospital operator if you have any questions, and they will be happy to assist you.

## 2013-12-24 NOTE — H&P (Signed)
  NTS SOAP Note  Vital Signs:  Vitals as of: 7/67/2094: Systolic 709: Diastolic 74: Heart Rate 74: Temp 96.3F: Height 23ft 4in: Weight 157Lbs 0 Ounces: BMI 26.95  BMI : 26.95 kg/m2  Subjective: This 24 Years 50 Months old Female presents for of a colon cancer.  Found on routine TCS.  Biopsy positive for colon cancer at proximal transverse colon.  No family h/o colon cancer.  Review of Symptoms:  Constitutional:unremarkable   Head:unremarkable    Eyes:unremarkable   Nose/Mouth/Throat:unremarkable Cardiovascular:  unremarkable   Respiratory:unremarkable   Gastrointestinal:  unremarkable   Genitourinary:unremarkable     Musculoskeletal:unremarkable   Skin:unremarkable Hematolgic/Lymphatic:unremarkable       hay fever   Past Medical History:    Reviewed  Past Medical History  Surgical History: neck surgery, hand surgery Medical Problems: none Allergies: sulfur Medications: none   Social History:Reviewed  Social History  Preferred Language: English Race:  White Ethnicity: Not Hispanic / Latino Age: 66 Years 7 Months Marital Status:  M   Smoking Status: Never smoker reviewed on 12/23/2013 Functional Status reviewed on 12/23/2013 ------------------------------------------------ Bathing: Normal Cooking: Normal Dressing: Normal Driving: Normal Eating: Normal Managing Meds: Normal Oral Care: Normal Shopping: Normal Toileting: Normal Transferring: Normal Walking: Normal Cognitive Status reviewed on 12/23/2013 ------------------------------------------------ Attention: Normal Decision Making: Normal Language: Normal Memory: Normal Motor: Normal Perception: Normal Problem Solving: Normal Visual and Spatial: Normal   Family History:  Reviewed  Family Health History Mother, Deceased; Diabetes mellitus, unspecified type;  Father, Deceased; Chronic obstructive lung disease (COPD);     Objective  Information: General:  Well appearing, well nourished in no distress. Heart:  RRR, no murmur or gallop.  Normal S1, S2.  No S3, S4.  Lungs:    CTA bilaterally, no wheezes, rhonchi, rales.  Breathing unlabored. Abdomen:Soft, NT/ND, normal bowel sounds, no HSM, no masses.  No peritoneal signs.  Assessment:Colon cancer  Diagnoses: 153.9 Malignant tumor of colon (Malignant neoplasm of colon, unspecified)  Procedures: 62836 - OFFICE OUTPATIENT NEW 30 MINUTES    Plan:  Scheduled for partial colectomy on 12/29/13.   Patient Education:Alternative treatments to surgery were discussed with patient (and family).  Risks and benefits  of procedure including bleeding, infection, leak, and possibility of a blood transfusion were fully explained to the patientwere fully explained to the patient (and family) who gave informed consent. Patient/family questions were addressed.  Follow-up:Pending Surgery

## 2013-12-25 ENCOUNTER — Encounter (HOSPITAL_COMMUNITY)
Admission: RE | Admit: 2013-12-25 | Discharge: 2013-12-25 | Disposition: A | Discharge status: Home / Self Care | Payer: Medicare Other | Source: Ambulatory Visit | Attending: General Surgery | Admitting: General Surgery

## 2013-12-25 ENCOUNTER — Encounter (HOSPITAL_COMMUNITY): Payer: Self-pay

## 2013-12-25 ENCOUNTER — Ambulatory Visit (HOSPITAL_COMMUNITY)
Admission: RE | Admit: 2013-12-25 | Discharge: 2013-12-25 | Disposition: A | Discharge status: Home / Self Care | Payer: Medicare Other | Source: Ambulatory Visit | Attending: General Surgery | Admitting: General Surgery

## 2013-12-25 ENCOUNTER — Other Ambulatory Visit: Payer: Self-pay

## 2013-12-25 ENCOUNTER — Encounter (INDEPENDENT_AMBULATORY_CARE_PROVIDER_SITE_OTHER): Payer: Self-pay | Admitting: *Deleted

## 2013-12-25 DIAGNOSIS — R0989 Other specified symptoms and signs involving the circulatory and respiratory systems: Secondary | ICD-10-CM | POA: Diagnosis not present

## 2013-12-25 DIAGNOSIS — R911 Solitary pulmonary nodule: Secondary | ICD-10-CM | POA: Diagnosis not present

## 2013-12-25 DIAGNOSIS — C189 Malignant neoplasm of colon, unspecified: Secondary | ICD-10-CM | POA: Diagnosis not present

## 2013-12-25 LAB — COMPREHENSIVE METABOLIC PANEL
ALT: 11 U/L (ref 0–35)
AST: 18 U/L (ref 0–37)
Albumin: 3.5 g/dL (ref 3.5–5.2)
Alkaline Phosphatase: 87 U/L (ref 39–117)
BUN: 12 mg/dL (ref 6–23)
CALCIUM: 9.4 mg/dL (ref 8.4–10.5)
CHLORIDE: 103 meq/L (ref 96–112)
CO2: 29 meq/L (ref 19–32)
CREATININE: 0.75 mg/dL (ref 0.50–1.10)
GFR calc non Af Amer: 86 mL/min — ABNORMAL LOW (ref >90)
GLUCOSE: 130 mg/dL — AB (ref 70–99)
Potassium: 4.6 mEq/L (ref 3.7–5.3)
Sodium: 143 mEq/L (ref 137–147)
Total Protein: 7.1 g/dL (ref 6.0–8.3)

## 2013-12-25 LAB — CBC WITH DIFFERENTIAL/PLATELET
Basophils Absolute: 0 10*3/uL (ref 0.0–0.1)
Basophils Relative: 0 % (ref 0–1)
EOS PCT: 2 % (ref 0–5)
Eosinophils Absolute: 0.2 10*3/uL (ref 0.0–0.7)
HEMATOCRIT: 37.6 % (ref 36.0–46.0)
Hemoglobin: 12 g/dL (ref 12.0–15.0)
LYMPHS ABS: 1.5 10*3/uL (ref 0.7–4.0)
LYMPHS PCT: 18 % (ref 12–46)
MCH: 26 pg (ref 26.0–34.0)
MCHC: 31.9 g/dL (ref 30.0–36.0)
MCV: 81.6 fL (ref 78.0–100.0)
MONO ABS: 1.2 10*3/uL — AB (ref 0.1–1.0)
Monocytes Relative: 15 % — ABNORMAL HIGH (ref 3–12)
NEUTROS ABS: 5.1 10*3/uL (ref 1.7–7.7)
Neutrophils Relative %: 65 % (ref 43–77)
Platelets: 286 10*3/uL (ref 150–400)
RBC: 4.61 MIL/uL (ref 3.87–5.11)
RDW: 13.8 % (ref 11.5–15.5)
WBC: 8 10*3/uL (ref 4.0–10.5)

## 2013-12-25 LAB — PREPARE RBC (CROSSMATCH)

## 2013-12-25 LAB — ABO/RH: ABO/RH(D): AB NEG

## 2013-12-25 NOTE — Pre-Procedure Instructions (Signed)
Patient given information to sign up for my chart at home. 

## 2013-12-29 ENCOUNTER — Encounter (HOSPITAL_COMMUNITY)
Admission: RE | Disposition: A | Discharge status: Home / Self Care | Payer: Self-pay | Source: Ambulatory Visit | Attending: General Surgery

## 2013-12-29 ENCOUNTER — Inpatient Hospital Stay (HOSPITAL_COMMUNITY): Payer: Medicare Other | Admitting: Anesthesiology

## 2013-12-29 ENCOUNTER — Encounter (HOSPITAL_COMMUNITY): Payer: Self-pay | Admitting: *Deleted

## 2013-12-29 ENCOUNTER — Encounter (HOSPITAL_COMMUNITY): Payer: Medicare Other | Admitting: Anesthesiology

## 2013-12-29 ENCOUNTER — Inpatient Hospital Stay (HOSPITAL_COMMUNITY)
Admission: RE | Admit: 2013-12-29 | Discharge: 2014-01-01 | DRG: 331 | Disposition: A | Discharge status: Home / Self Care | Payer: Medicare Other | Source: Ambulatory Visit | Attending: General Surgery | Admitting: General Surgery

## 2013-12-29 DIAGNOSIS — C19 Malignant neoplasm of rectosigmoid junction: Secondary | ICD-10-CM | POA: Diagnosis not present

## 2013-12-29 DIAGNOSIS — C184 Malignant neoplasm of transverse colon: Principal | ICD-10-CM | POA: Diagnosis present

## 2013-12-29 DIAGNOSIS — C260 Malignant neoplasm of intestinal tract, part unspecified: Secondary | ICD-10-CM | POA: Diagnosis not present

## 2013-12-29 DIAGNOSIS — Z833 Family history of diabetes mellitus: Secondary | ICD-10-CM | POA: Diagnosis not present

## 2013-12-29 DIAGNOSIS — C189 Malignant neoplasm of colon, unspecified: Secondary | ICD-10-CM | POA: Diagnosis not present

## 2013-12-29 HISTORY — PX: PARTIAL COLECTOMY: SHX5273

## 2013-12-29 SURGERY — COLECTOMY, PARTIAL
Anesthesia: General | Site: Abdomen

## 2013-12-29 MED ORDER — GLYCOPYRROLATE 0.2 MG/ML IJ SOLN
INTRAMUSCULAR | Status: AC
  Filled 2013-12-29: qty 1

## 2013-12-29 MED ORDER — NEOSTIGMINE METHYLSULFATE 10 MG/10ML IV SOLN
INTRAVENOUS | Status: AC
  Filled 2013-12-29: qty 1

## 2013-12-29 MED ORDER — SCOPOLAMINE 1 MG/3DAYS TD PT72
MEDICATED_PATCH | TRANSDERMAL | Status: AC
  Filled 2013-12-29: qty 1

## 2013-12-29 MED ORDER — FENTANYL CITRATE 0.05 MG/ML IJ SOLN
INTRAMUSCULAR | Status: AC
  Filled 2013-12-29: qty 2

## 2013-12-29 MED ORDER — ONDANSETRON HCL 4 MG/2ML IJ SOLN
4.0000 mg | Freq: Once | INTRAMUSCULAR | Status: AC | PRN
  Administered 2013-12-29: 4 mg via INTRAVENOUS

## 2013-12-29 MED ORDER — CEFOTETAN DISODIUM-DEXTROSE 2-2.08 GM-% IV SOLR
2.0000 g | Freq: Once | INTRAVENOUS | Status: AC
Start: 2013-12-29 — End: 2013-12-29
  Administered 2013-12-29: 2 g via INTRAVENOUS

## 2013-12-29 MED ORDER — ONDANSETRON HCL 4 MG PO TABS: 4.0000 mg | ORAL_TABLET | Freq: Four times a day (QID) | ORAL | Status: DC | PRN

## 2013-12-29 MED ORDER — ROCURONIUM BROMIDE 50 MG/5ML IV SOLN
INTRAVENOUS | Status: AC
  Filled 2013-12-29: qty 1

## 2013-12-29 MED ORDER — SCOPOLAMINE 1 MG/3DAYS TD PT72
1.0000 | MEDICATED_PATCH | Freq: Once | TRANSDERMAL | Status: DC
  Administered 2013-12-29: 1.5 mg via TRANSDERMAL

## 2013-12-29 MED ORDER — LIDOCAINE HCL 1 % IJ SOLN
INTRAMUSCULAR | Status: DC | PRN
  Administered 2013-12-29: 40 mg via INTRADERMAL

## 2013-12-29 MED ORDER — CHLORHEXIDINE GLUCONATE 4 % EX LIQD: 1.0000 "application " | Freq: Once | CUTANEOUS | Status: DC

## 2013-12-29 MED ORDER — HYDROCODONE-ACETAMINOPHEN 5-325 MG PO TABS
1.0000 | ORAL_TABLET | ORAL | Status: DC | PRN
  Administered 2013-12-30 – 2014-01-01 (×8): 1 via ORAL
  Filled 2013-12-29 (×8): qty 1

## 2013-12-29 MED ORDER — POVIDONE-IODINE 10 % EX OINT
TOPICAL_OINTMENT | CUTANEOUS | Status: AC
  Filled 2013-12-29: qty 1

## 2013-12-29 MED ORDER — GLYCOPYRROLATE 0.2 MG/ML IJ SOLN
INTRAMUSCULAR | Status: AC
  Filled 2013-12-29: qty 2

## 2013-12-29 MED ORDER — FENTANYL CITRATE 0.05 MG/ML IJ SOLN
INTRAMUSCULAR | Status: AC
  Filled 2013-12-29: qty 5

## 2013-12-29 MED ORDER — ONDANSETRON HCL 4 MG/2ML IJ SOLN
INTRAMUSCULAR | Status: AC
  Filled 2013-12-29: qty 2

## 2013-12-29 MED ORDER — ONDANSETRON HCL 4 MG/2ML IJ SOLN
4.0000 mg | Freq: Once | INTRAMUSCULAR | Status: AC
  Administered 2013-12-29: 4 mg via INTRAVENOUS

## 2013-12-29 MED ORDER — ENOXAPARIN SODIUM 40 MG/0.4ML ~~LOC~~ SOLN
40.0000 mg | SUBCUTANEOUS | Status: DC
  Administered 2013-12-30 – 2014-01-01 (×3): 40 mg via SUBCUTANEOUS
  Filled 2013-12-29 (×3): qty 0.4

## 2013-12-29 MED ORDER — LACTATED RINGERS IV SOLN
INTRAVENOUS | Status: DC
  Administered 2013-12-30 – 2013-12-31 (×4): via INTRAVENOUS

## 2013-12-29 MED ORDER — DEXAMETHASONE SODIUM PHOSPHATE 4 MG/ML IJ SOLN
4.0000 mg | Freq: Once | INTRAMUSCULAR | Status: AC
  Administered 2013-12-29: 4 mg via INTRAVENOUS

## 2013-12-29 MED ORDER — ENOXAPARIN SODIUM 40 MG/0.4ML ~~LOC~~ SOLN
SUBCUTANEOUS | Status: AC
  Filled 2013-12-29: qty 0.4

## 2013-12-29 MED ORDER — ALVIMOPAN 12 MG PO CAPS
12.0000 mg | ORAL_CAPSULE | Freq: Two times a day (BID) | ORAL | Status: DC
  Administered 2013-12-30 – 2013-12-31 (×4): 12 mg via ORAL
  Filled 2013-12-29 (×4): qty 1

## 2013-12-29 MED ORDER — DEXAMETHASONE SODIUM PHOSPHATE 4 MG/ML IJ SOLN
INTRAMUSCULAR | Status: AC
  Filled 2013-12-29: qty 1

## 2013-12-29 MED ORDER — MIDAZOLAM HCL 2 MG/2ML IJ SOLN
1.0000 mg | INTRAMUSCULAR | Status: DC | PRN
  Administered 2013-12-29: 2 mg via INTRAVENOUS

## 2013-12-29 MED ORDER — LORAZEPAM 2 MG/ML IJ SOLN: 1.0000 mg | INTRAMUSCULAR | Status: DC | PRN

## 2013-12-29 MED ORDER — PROPOFOL 10 MG/ML IV EMUL
INTRAVENOUS | Status: AC
  Filled 2013-12-29: qty 20

## 2013-12-29 MED ORDER — ETOMIDATE 2 MG/ML IV SOLN
INTRAVENOUS | Status: AC
  Filled 2013-12-29: qty 10

## 2013-12-29 MED ORDER — ENOXAPARIN SODIUM 40 MG/0.4ML ~~LOC~~ SOLN
40.0000 mg | Freq: Once | SUBCUTANEOUS | Status: AC
  Administered 2013-12-29: 40 mg via SUBCUTANEOUS

## 2013-12-29 MED ORDER — PROPOFOL 10 MG/ML IV BOLUS
INTRAVENOUS | Status: DC | PRN
  Administered 2013-12-29: 100 mg via INTRAVENOUS
  Administered 2013-12-29: 25 mg via INTRAVENOUS

## 2013-12-29 MED ORDER — KETOROLAC TROMETHAMINE 30 MG/ML IJ SOLN
30.0000 mg | Freq: Once | INTRAMUSCULAR | Status: AC
  Administered 2013-12-29: 30 mg via INTRAVENOUS
  Filled 2013-12-29: qty 1

## 2013-12-29 MED ORDER — NEOSTIGMINE METHYLSULFATE 10 MG/10ML IV SOLN
INTRAVENOUS | Status: DC | PRN
  Administered 2013-12-29: 3 mg via INTRAVENOUS

## 2013-12-29 MED ORDER — GLYCOPYRROLATE 0.2 MG/ML IJ SOLN
INTRAMUSCULAR | Status: DC | PRN
  Administered 2013-12-29: .5 mg via INTRAVENOUS

## 2013-12-29 MED ORDER — SODIUM CHLORIDE 0.9 % IJ SOLN
INTRAMUSCULAR | Status: AC
  Filled 2013-12-29: qty 10

## 2013-12-29 MED ORDER — ALVIMOPAN 12 MG PO CAPS
ORAL_CAPSULE | ORAL | Status: AC
  Filled 2013-12-29: qty 1

## 2013-12-29 MED ORDER — HYDROMORPHONE HCL PF 1 MG/ML IJ SOLN
1.0000 mg | INTRAMUSCULAR | Status: DC | PRN
  Administered 2013-12-29 – 2013-12-30 (×3): 1 mg via INTRAVENOUS
  Filled 2013-12-29 (×3): qty 1

## 2013-12-29 MED ORDER — LIDOCAINE HCL (PF) 1 % IJ SOLN
INTRAMUSCULAR | Status: AC
  Filled 2013-12-29: qty 5

## 2013-12-29 MED ORDER — ONDANSETRON HCL 4 MG/2ML IJ SOLN
4.0000 mg | Freq: Four times a day (QID) | INTRAMUSCULAR | Status: DC | PRN
  Administered 2013-12-31 (×2): 4 mg via INTRAVENOUS
  Filled 2013-12-29 (×2): qty 2

## 2013-12-29 MED ORDER — SODIUM CHLORIDE 0.9 % IV BOLUS (SEPSIS)
500.0000 mL | Freq: Once | INTRAVENOUS | Status: AC
  Administered 2013-12-30: 500 mL via INTRAVENOUS

## 2013-12-29 MED ORDER — ARTIFICIAL TEARS OP OINT
TOPICAL_OINTMENT | OPHTHALMIC | Status: AC
  Filled 2013-12-29: qty 3.5

## 2013-12-29 MED ORDER — FENTANYL CITRATE 0.05 MG/ML IJ SOLN
INTRAMUSCULAR | Status: DC | PRN
  Administered 2013-12-29: 100 ug via INTRAVENOUS
  Administered 2013-12-29 (×5): 50 ug via INTRAVENOUS

## 2013-12-29 MED ORDER — CEFOTETAN DISODIUM-DEXTROSE 2-2.08 GM-% IV SOLR
INTRAVENOUS | Status: AC
  Filled 2013-12-29: qty 50

## 2013-12-29 MED ORDER — ROCURONIUM BROMIDE 100 MG/10ML IV SOLN
INTRAVENOUS | Status: DC | PRN
  Administered 2013-12-29: 40 mg via INTRAVENOUS

## 2013-12-29 MED ORDER — DEXTROSE 5 % IV SOLN
1.0000 g | Freq: Once | INTRAVENOUS | Status: DC
  Filled 2013-12-29: qty 1

## 2013-12-29 MED ORDER — SODIUM CHLORIDE 0.9 % IR SOLN
Status: DC | PRN
  Administered 2013-12-29: 3000 mL

## 2013-12-29 MED ORDER — MIDAZOLAM HCL 2 MG/2ML IJ SOLN
INTRAMUSCULAR | Status: AC
  Filled 2013-12-29: qty 2

## 2013-12-29 MED ORDER — ACETAMINOPHEN 325 MG PO TABS: 650.0000 mg | ORAL_TABLET | Freq: Four times a day (QID) | ORAL | Status: DC | PRN

## 2013-12-29 MED ORDER — POVIDONE-IODINE 10 % EX OINT
TOPICAL_OINTMENT | CUTANEOUS | Status: DC | PRN
  Administered 2013-12-29: 1 via TOPICAL

## 2013-12-29 MED ORDER — LACTATED RINGERS IV SOLN
INTRAVENOUS | Status: DC
  Administered 2013-12-29: 11:00:00 via INTRAVENOUS
  Administered 2013-12-29: 1000 mL via INTRAVENOUS
  Administered 2013-12-29: 11:00:00 via INTRAVENOUS

## 2013-12-29 MED ORDER — FENTANYL CITRATE 0.05 MG/ML IJ SOLN
25.0000 ug | INTRAMUSCULAR | Status: DC | PRN
  Administered 2013-12-29 (×2): 50 ug via INTRAVENOUS

## 2013-12-29 MED ORDER — ALVIMOPAN 12 MG PO CAPS
12.0000 mg | ORAL_CAPSULE | Freq: Once | ORAL | Status: AC
  Administered 2013-12-29: 12 mg via ORAL

## 2013-12-29 MED ORDER — GLYCOPYRROLATE 0.2 MG/ML IJ SOLN
0.2000 mg | Freq: Once | INTRAMUSCULAR | Status: AC
  Administered 2013-12-29: 0.2 mg via INTRAVENOUS

## 2013-12-29 SURGICAL SUPPLY — 60 items
APPLIER CLIP 11 MED OPEN (CLIP)
APPLIER CLIP 13 LRG OPEN (CLIP)
BAG HAMPER (MISCELLANEOUS) ×3 IMPLANT
BARRIER SKIN 2 3/4 (OSTOMY) IMPLANT
BARRIER SKIN 2 3/4 INCH (OSTOMY)
CHLORAPREP W/TINT 26ML (MISCELLANEOUS) ×3 IMPLANT
CLAMP POUCH DRAINAGE QUIET (OSTOMY) IMPLANT
CLIP APPLIE 11 MED OPEN (CLIP) IMPLANT
CLIP APPLIE 13 LRG OPEN (CLIP) IMPLANT
CLOTH BEACON ORANGE TIMEOUT ST (SAFETY) ×3 IMPLANT
COVER LIGHT HANDLE STERIS (MISCELLANEOUS) ×6 IMPLANT
COVER MAYO STAND XLG (DRAPE) ×3 IMPLANT
DRAPE UTILITY W/TAPE 26X15 (DRAPES) ×6 IMPLANT
DRAPE WARM FLUID 44X44 (DRAPE) ×3 IMPLANT
DRSG OPSITE POSTOP 4X8 (GAUZE/BANDAGES/DRESSINGS) ×3 IMPLANT
ELECT BLADE 6 FLAT ULTRCLN (ELECTRODE) ×3 IMPLANT
ELECT REM PT RETURN 9FT ADLT (ELECTROSURGICAL) ×3
ELECTRODE REM PT RTRN 9FT ADLT (ELECTROSURGICAL) ×1 IMPLANT
FORMALIN 10 PREFIL 480ML (MISCELLANEOUS) IMPLANT
GAUZE SPONGE 4X4 12PLY STRL (GAUZE/BANDAGES/DRESSINGS) IMPLANT
GLOVE SURG SS PI 7.5 STRL IVOR (GLOVE) ×9 IMPLANT
GOWN STRL REUS W/TWL LRG LVL3 (GOWN DISPOSABLE) ×24 IMPLANT
INST SET MAJOR GENERAL (KITS) ×3 IMPLANT
KIT BLADEGUARD II DBL (SET/KITS/TRAYS/PACK) ×3 IMPLANT
KIT ROOM TURNOVER APOR (KITS) ×3 IMPLANT
LIGASURE IMPACT 36 18CM CVD LR (INSTRUMENTS) ×3 IMPLANT
MANIFOLD NEPTUNE II (INSTRUMENTS) ×3 IMPLANT
NS IRRIG 1000ML POUR BTL (IV SOLUTION) ×9 IMPLANT
PACK ABDOMINAL MAJOR (CUSTOM PROCEDURE TRAY) ×3 IMPLANT
PAD ARMBOARD 7.5X6 YLW CONV (MISCELLANEOUS) ×3 IMPLANT
PENCIL HANDSWITCHING (ELECTRODE) ×3 IMPLANT
POUCH OSTOMY 2 3/4  H 3804 (WOUND CARE)
POUCH OSTOMY 2 PC DRNBL 2.25 (WOUND CARE) IMPLANT
POUCH OSTOMY 2 PC DRNBL 2.75 (WOUND CARE) IMPLANT
POUCH OSTOMY DRNBL 2 1/4 (WOUND CARE)
RELOAD LINEAR CUT PROX 55 BLUE (ENDOMECHANICALS) IMPLANT
RELOAD PROXIMATE 75MM BLUE (ENDOMECHANICALS) ×6 IMPLANT
RETRACTOR WND ALEXIS 25 LRG (MISCELLANEOUS) ×1 IMPLANT
RTRCTR WOUND ALEXIS 25CM LRG (MISCELLANEOUS) ×3
SET BASIN LINEN APH (SET/KITS/TRAYS/PACK) ×3 IMPLANT
SPONGE LAP 18X18 X RAY DECT (DISPOSABLE) IMPLANT
STAPLER GUN LINEAR PROX 60 (STAPLE) ×3 IMPLANT
STAPLER PROXIMATE 55 BLUE (STAPLE) IMPLANT
STAPLER PROXIMATE 75MM BLUE (STAPLE) ×3 IMPLANT
STAPLER VISISTAT (STAPLE) ×3 IMPLANT
SUCTION POOLE TIP (SUCTIONS) ×6 IMPLANT
SUT CHROMIC 0 SH (SUTURE) ×3 IMPLANT
SUT CHROMIC 2 0 SH (SUTURE) ×3 IMPLANT
SUT CHROMIC 3 0 SH 27 (SUTURE) ×3 IMPLANT
SUT NOVA NAB GS-26 0 60 (SUTURE) IMPLANT
SUT PDS AB 0 CTX 60 (SUTURE) ×6 IMPLANT
SUT SILK 2 0 (SUTURE)
SUT SILK 2 0 REEL (SUTURE) IMPLANT
SUT SILK 2-0 18XBRD TIE 12 (SUTURE) IMPLANT
SUT SILK 3 0 SH CR/8 (SUTURE) ×6 IMPLANT
TOWEL BLUE STERILE X RAY DET (MISCELLANEOUS) ×3 IMPLANT
TOWEL OR 17X26 4PK STRL BLUE (TOWEL DISPOSABLE) ×3 IMPLANT
TRAY FOLEY CATH 16FR SILVER (SET/KITS/TRAYS/PACK) ×3 IMPLANT
YANKAUER SUCT BULB TIP 10FT TU (MISCELLANEOUS) ×3 IMPLANT
YANKAUER SUCT BULB TIP NO VENT (SUCTIONS) ×9 IMPLANT

## 2013-12-29 NOTE — Op Note (Signed)
Patient:  Amanda Norris  DOB:  May 24, 1946  MRN:  [id number]   Preop Diagnosis:  Colon carcinoma  Postop Diagnosis:  Same  Procedure:  Right hemicolectomy  Surgeon:  Aviva Signs, M.D.  Anes:  General endotracheal  Indications:  Patient is a 68 year old white female who was found on colonoscopy to have a proximal transverse colon neoplasm which was biopsied positive for adenocarcinoma. The patient now comes the operating room for a right hemicolectomy. The risks and benefits of the procedure including bleeding, infection, cardiopulmonary difficulties, and the possibility of a blood transfusion were fully explained to the patient, who gave informed consent.  Procedure note:  Patient is placed the supine position. After induction of general endotracheal anesthesia, the abdomen was prepped and draped using usual sterile technique with DuraPrep. Surgical site confirmation was performed.  A midline incision was made along the upper portion of the abdomen down to the umbilicus. The peritoneal cavity was entered into without difficulty. The liver was inspected and noted within normal limits. No mass lesions were noted. The small bowel is within normal limits. No abnormal lesions were noted. In the midportion of the transverse colon, the neoplastic process was found. It was not attached to any surrounding organs. The right colon was mobilized along its peritoneal reflection. A GIA 75 stapler was placed across the terminal ileum and fired. This was likewise done along the mid transverse colon, distal to the neoplastic process. Care was taken to avoid the middle colic artery. The descending colon and transverse colon mesentery was then divided using the LigaSure. The specimen was then removed from the operative field and sent to pathology further examination. A side to side ileal colic anastomosis was then performed using a GIA 75 stapler. The enterotomy was closed using a TA 60 stapler. The staple line was  oversewn using 3-0 silk sutures. Surrounding omentum was then placed over the anastomosis and secured in place using 3-0 silk sutures. The mesentery was closed using a 2-0 chromic gut running suture. The bowel was then returned into the abdominal cavity in an orderly fashion. All operating personnel changed the gown and gloves. A new setup was then used. The abdominal cavity was copiously irrigated with normal saline. The fluid was evacuated from the abdominal cavity prior to closure. The fascia was reapproximated using a looped 0 PDS running suture. Subcutaneous layer was irrigated normal saline and skin was closed using staples. Betadine ointment and dry sterile dressings were applied.  All tape and needle counts were correct at the end of the procedure. Patient was extubated in the operating room and transferred to PACU in stable condition.  Complications:  None  EBL:  75 cc  Specimen:  Right colon

## 2013-12-29 NOTE — Anesthesia Postprocedure Evaluation (Signed)
  Anesthesia Post-op Note  Patient: Amanda Norris  Procedure(s) Performed: Procedure(s): RIGHT HEMICOLECTOMY (N/A)  Patient Location: PACU  Anesthesia Type:General  Level of Consciousness: awake, alert  and oriented  Airway and Oxygen Therapy: Patient Spontanous Breathing and Patient connected to face mask oxygen  Post-op Pain: none  Post-op Assessment: Post-op Vital signs reviewed, Patient's Cardiovascular Status Stable, Respiratory Function Stable, Patent Airway and No signs of Nausea or vomiting  Post-op Vital Signs: Reviewed and stable  Last Vitals:  Filed Vitals:   12/29/13 1150  BP: 149/65  Pulse: 84  Temp: 37 C  Resp: 12    Complications: No apparent anesthesia complications

## 2013-12-29 NOTE — Progress Notes (Addendum)
Educated patient on CTDB, splinting, and early ambulation. Patient and spouse agreeable to begin ambulation tomorrow in hall. Accurately demonstrated breathing exercises. Dangled at bedside. Tolerating clear liquid diet.

## 2013-12-29 NOTE — Anesthesia Preprocedure Evaluation (Signed)
Anesthesia Evaluation  Patient identified by MRN, date of birth, ID band Patient awake    Reviewed: Allergy & Precautions, H&P , NPO status , Patient's Chart, lab work & pertinent test results  History of Anesthesia Complications (+) PONV and history of anesthetic complications  Airway Mallampati: I TM Distance: >3 FB Neck ROM: Full    Dental  (+) Teeth Intact   Pulmonary former smoker,  breath sounds clear to auscultation        Cardiovascular negative cardio ROS  Rhythm:Regular Rate:Normal     Neuro/Psych    GI/Hepatic negative GI ROS,   Endo/Other    Renal/GU      Musculoskeletal   Abdominal   Peds  Hematology   Anesthesia Other Findings   Reproductive/Obstetrics                           Anesthesia Physical Anesthesia Plan  ASA: II  Anesthesia Plan: General   Post-op Pain Management:    Induction: Intravenous  Airway Management Planned: Oral ETT  Additional Equipment:   Intra-op Plan:   Post-operative Plan: Extubation in OR  Informed Consent: I have reviewed the patients History and Physical, chart, labs and discussed the procedure including the risks, benefits and alternatives for the proposed anesthesia with the patient or authorized representative who has indicated his/her understanding and acceptance.     Plan Discussed with:   Anesthesia Plan Comments:         Anesthesia Quick Evaluation

## 2013-12-29 NOTE — Anesthesia Procedure Notes (Signed)
Procedure Name: Intubation Date/Time: 12/29/2013 10:04 AM Performed by: Tressie Stalker E Pre-anesthesia Checklist: Patient identified, Patient being monitored, Timeout performed, Emergency Drugs available and Suction available Patient Re-evaluated:Patient Re-evaluated prior to inductionOxygen Delivery Method: Circle System Utilized Preoxygenation: Pre-oxygenation with 100% oxygen Intubation Type: IV induction Ventilation: Mask ventilation without difficulty Laryngoscope Size: Mac and 3 Grade View: Grade I Tube type: Oral Tube size: 7.0 mm Number of attempts: 1 Airway Equipment and Method: stylet Placement Confirmation: ETT inserted through vocal cords under direct vision,  positive ETCO2 and breath sounds checked- equal and bilateral Secured at: 22 cm Tube secured with: Tape Dental Injury: Teeth and Oropharynx as per pre-operative assessment

## 2013-12-29 NOTE — Interval H&P Note (Signed)
History and Physical Interval Note:  12/29/2013 8:58 AM  Amanda Norris  has presented today for surgery, with the diagnosis of colon cancer  The various methods of treatment have been discussed with the patient and family. After consideration of risks, benefits and other options for treatment, the patient has consented to  Procedure(s): PARTIAL COLECTOMY (N/A) as a surgical intervention .  The patient's history has been reviewed, patient examined, no change in status, stable for surgery.  I have reviewed the patient's chart and labs.  Questions were answered to the patient's satisfaction.     Aviva Signs A

## 2013-12-29 NOTE — Transfer of Care (Signed)
Immediate Anesthesia Transfer of Care Note  Patient: Amanda Norris  Procedure(s) Performed: Procedure(s): RIGHT HEMICOLECTOMY (N/A)  Patient Location: PACU  Anesthesia Type:General  Level of Consciousness: awake, alert  and oriented  Airway & Oxygen Therapy: Patient Spontanous Breathing and Patient connected to nasal cannula oxygen  Post-op Assessment: Report given to PACU RN  Post vital signs: Reviewed and stable  Complications: No apparent anesthesia complications

## 2013-12-30 ENCOUNTER — Encounter (HOSPITAL_COMMUNITY): Payer: Self-pay | Admitting: General Surgery

## 2013-12-30 LAB — BASIC METABOLIC PANEL
BUN: 8 mg/dL (ref 6–23)
CHLORIDE: 105 meq/L (ref 96–112)
CO2: 27 mEq/L (ref 19–32)
Calcium: 8.7 mg/dL (ref 8.4–10.5)
Creatinine, Ser: 0.7 mg/dL (ref 0.50–1.10)
GFR calc non Af Amer: 88 mL/min — ABNORMAL LOW (ref >90)
Glucose, Bld: 92 mg/dL (ref 70–99)
POTASSIUM: 4.6 meq/L (ref 3.7–5.3)
Sodium: 142 mEq/L (ref 137–147)

## 2013-12-30 LAB — CBC
HCT: 32.4 % — ABNORMAL LOW (ref 36.0–46.0)
Hemoglobin: 10.3 g/dL — ABNORMAL LOW (ref 12.0–15.0)
MCH: 25.8 pg — ABNORMAL LOW (ref 26.0–34.0)
MCHC: 31.8 g/dL (ref 30.0–36.0)
MCV: 81.2 fL (ref 78.0–100.0)
Platelets: 259 10*3/uL (ref 150–400)
RBC: 3.99 MIL/uL (ref 3.87–5.11)
RDW: 13.7 % (ref 11.5–15.5)
WBC: 9 10*3/uL (ref 4.0–10.5)

## 2013-12-30 LAB — PHOSPHORUS: Phosphorus: 2.7 mg/dL (ref 2.3–4.6)

## 2013-12-30 LAB — MAGNESIUM: Magnesium: 2 mg/dL (ref 1.5–2.5)

## 2013-12-30 MED ORDER — PANTOPRAZOLE SODIUM 40 MG PO TBEC
40.0000 mg | DELAYED_RELEASE_TABLET | Freq: Every day | ORAL | Status: DC
  Administered 2013-12-30 – 2013-12-31 (×2): 40 mg via ORAL
  Filled 2013-12-30 (×2): qty 1

## 2013-12-30 MED ORDER — FENTANYL CITRATE 0.05 MG/ML IJ SOLN: 25.0000 ug | INTRAMUSCULAR | Status: DC | PRN

## 2013-12-30 NOTE — Anesthesia Postprocedure Evaluation (Signed)
  Anesthesia Post-op Note  Patient: Amanda Norris  Procedure(s) Performed: Procedure(s): RIGHT HEMICOLECTOMY (N/A)  Patient Location: Nursing Unit  Anesthesia Type:General  Level of Consciousness: awake and patient cooperative  Airway and Oxygen Therapy: Patient Spontanous Breathing  Post-op Pain: mild  Post-op Assessment: Post-op Vital signs reviewed, Patient's Cardiovascular Status Stable, Respiratory Function Stable, Adequate PO intake and Pain level controlled Reports nausea and vomtting x 2 yesterday. No problems today.  Post-op Vital Signs: Reviewed and stable   Complications: No apparent anesthesia complications

## 2013-12-30 NOTE — Progress Notes (Signed)
UR chart review completed.  

## 2013-12-30 NOTE — Care Management Note (Addendum)
    Page 1 of 1   01/01/2014     9:00:18 AM CARE MANAGEMENT NOTE 01/01/2014  Patient:  Amanda Norris, Amanda Norris   Account Number:  [id number]  Date Initiated:  12/30/2013  Documentation initiated by:  Theophilus Kinds  Subjective/Objective Assessment:   Pt admitted from home s/p hemicolectomy. Pt lives with her husband and will return home at discharge. Pt is independent with ADL's. Pts son will be staying with pt at discharge for a while to assist pt once husband returns to work.     Action/Plan:   No CM needs noted.   Anticipated DC Date:  01/02/2014   Anticipated DC Plan:  [place]  CM consult      Choice offered to / List presented to:             Status of service:  Completed, signed off Medicare Important Message given?  YES (If response is "NO", the following Medicare IM given date fields will be blank) Date Medicare IM given:  01/01/2014 Date Additional Medicare IM given:    Discharge Disposition:  HOME/SELF CARE  Per UR Regulation:    If discussed at Long Length of Stay Meetings, dates discussed:    Comments:  01/01/14 0900 Christinia Gully, RN BSN CM Pt discharged home today. No CM needs noted.  12/30/13 Harvey, RN BSN CM

## 2013-12-30 NOTE — Progress Notes (Signed)
Pt ambulated in hallway with standby assist from NT. Pt ambulated approximately 150 feet. Tolerated well. No complaints.

## 2013-12-30 NOTE — Progress Notes (Signed)
1 Day Post-Op  Subjective: Mild incisional pain. Tolerating clear liquid diet well.  Objective: Vital signs in last 24 hours: Temp:  [97.8 F (36.6 C)-98.7 F (37.1 C)] 98.7 F (37.1 C) (06/23 0531) Pulse Rate:  [60-93] 60 (06/23 0531) Resp:  [11-21] 18 (06/23 0531) BP: (80-177)/(43-87) 110/45 mmHg (06/23 0531) SpO2:  [93 %-100 %] 96 % (06/23 0531) Weight:  [76.658 kg (169 lb)] 76.658 kg (169 lb) (06/22 2021) Last BM Date: 12/29/13  Intake/Output from previous day: 06/22 0701 - 06/23 0700 In: 2480 [P.O.:180; I.V.:2300] Out: 1320 [Urine:1245; Blood:75] Intake/Output this shift:    General appearance: alert, cooperative and no distress Resp: clear to auscultation bilaterally Cardio: regular rate and rhythm, S1, S2 normal, no murmur, click, rub or gallop GI: Soft. Dressing dry and intact.  Lab Results:   Recent Labs  12/30/13 0533  WBC 9.0  HGB 10.3*  HCT 32.4*  PLT 259   BMET  Recent Labs  12/30/13 0533  NA 142  K 4.6  CL 105  CO2 27  GLUCOSE 92  BUN 8  CREATININE 0.70  CALCIUM 8.7   PT/INR No results found for this basename: LABPROT, INR,  in the last 72 hours  Studies/Results: No results found.  Anti-infectives: Anti-infectives   Start     Dose/Rate Route Frequency Ordered Stop   12/29/13 0930  cefoTEtan in Dextrose 5% (CEFOTAN) IVPB 2 g     2 g Intravenous  Once 12/29/13 0915 12/29/13 1003   12/29/13 0915  cefoTEtan (CEFOTAN) 1 g in dextrose 5 % 50 mL IVPB  Status:  Discontinued     1 g 100 mL/hr over 30 Minutes Intravenous  Once 12/29/13 0908 12/29/13 0915      Assessment/Plan: s/p Procedure(s): RIGHT HEMICOLECTOMY Impression: Stable on postoperative day one. Tolerating clear liquid diet well. Hemoglobin stable. We'll use fentanyl instead of dilaudid for pain. We'll get patient up in chair.  LOS: 1 day    JENKINS,MARK A 12/30/2013

## 2013-12-30 NOTE — Addendum Note (Signed)
Addendum created 12/30/13 1319 by Vista Deck, CRNA   Modules edited: Notes Section   Notes Section:  File: [id number]

## 2013-12-30 NOTE — Progress Notes (Signed)
Pt ambulated approximately 10 feet in room to chair with standby assistance. Pt tolerated well. No complaints of increased pain or shortness of breath.  Pt left in chair with call bell in reach and husband at bedside.

## 2013-12-31 ENCOUNTER — Inpatient Hospital Stay (HOSPITAL_COMMUNITY): Payer: Medicare Other

## 2013-12-31 ENCOUNTER — Encounter (HOSPITAL_COMMUNITY): Payer: Self-pay | Admitting: Radiology

## 2013-12-31 DIAGNOSIS — C260 Malignant neoplasm of intestinal tract, part unspecified: Secondary | ICD-10-CM | POA: Diagnosis not present

## 2013-12-31 LAB — BASIC METABOLIC PANEL
BUN: 5 mg/dL — AB (ref 6–23)
CALCIUM: 8.4 mg/dL (ref 8.4–10.5)
CHLORIDE: 106 meq/L (ref 96–112)
CO2: 28 meq/L (ref 19–32)
CREATININE: 0.67 mg/dL (ref 0.50–1.10)
GFR calc Af Amer: >90 mL/min (ref >90)
GFR calc non Af Amer: 89 mL/min — ABNORMAL LOW (ref >90)
Glucose, Bld: 108 mg/dL — ABNORMAL HIGH (ref 70–99)
Potassium: 4.4 mEq/L (ref 3.7–5.3)
Sodium: 143 mEq/L (ref 137–147)

## 2013-12-31 LAB — CBC
HEMATOCRIT: 33.8 % — AB (ref 36.0–46.0)
Hemoglobin: 10.8 g/dL — ABNORMAL LOW (ref 12.0–15.0)
MCH: 25.9 pg — AB (ref 26.0–34.0)
MCHC: 32 g/dL (ref 30.0–36.0)
MCV: 81.1 fL (ref 78.0–100.0)
Platelets: 255 10*3/uL (ref 150–400)
RBC: 4.17 MIL/uL (ref 3.87–5.11)
RDW: 13.9 % (ref 11.5–15.5)
WBC: 8.2 10*3/uL (ref 4.0–10.5)

## 2013-12-31 LAB — PHOSPHORUS: Phosphorus: 1.8 mg/dL — ABNORMAL LOW (ref 2.3–4.6)

## 2013-12-31 LAB — MAGNESIUM: Magnesium: 2 mg/dL (ref 1.5–2.5)

## 2013-12-31 MED ORDER — POTASSIUM PHOSPHATES 15 MMOLE/5ML IV SOLN
20.0000 mmol | Freq: Once | INTRAVENOUS | Status: AC
  Administered 2013-12-31: 20 mmol via INTRAVENOUS
  Filled 2013-12-31: qty 6.67

## 2013-12-31 NOTE — Progress Notes (Signed)
MEDICATION RELATED CONSULT NOTE - INITIAL   Pharmacy Consult for Phosphorus Replacement Indication: Hypophosphatemia  Allergies  Allergen Reactions  . Sulfa Antibiotics Other (See Comments)    "Sores in my mouth"    Patient Measurements: Height: 5\' 4"  (162.6 cm) Weight: 169 lb (76.658 kg) (blankets & pillows on bed, pt weighed herself at157lbs nude ) IBW/kg (Calculated) : 54.7  Vital Signs: Temp: 99.1 F (37.3 C) (06/24 0536) Temp src: Oral (06/24 0536) BP: 147/64 mmHg (06/24 0536) Pulse Rate: 65 (06/24 0536) Intake/Output from previous day: 06/23 0701 - 06/24 0700 In: 3163.3 [P.O.:840; I.V.:2323.3] Out: 3400 [Urine:3400] Intake/Output from this shift: Total I/O In: -  Out: 750 [Urine:750]  Labs:  Recent Labs  12/30/13 0533 12/31/13 0542  WBC 9.0 8.2  HGB 10.3* 10.8*  HCT 32.4* 33.8*  PLT 259 255  CREATININE 0.70 0.67  MG 2.0 2.0  PHOS 2.7 1.8*   Estimated Creatinine Clearance: 68.4 ml/min (by C-G formula based on Cr of 0.67).   Microbiology: No results found for this or any previous visit (from the past 720 hour(s)).  Medical History: Past Medical History  Diagnosis Date  . Arthritis   . PONV (postoperative nausea and vomiting)     Medications:  Scheduled:  . alvimopan  12 mg Oral BID  . enoxaparin (LOVENOX) injection  40 mg Subcutaneous Q24H  . pantoprazole  40 mg Oral Q1200  . potassium phosphate IVPB (mmol)  20 mmol Intravenous Once    Assessment: 68 yo F s/p right hemicolectomy on 6/22.   Phosphorus is low post-op.  Potassium & Sodium are within desired goal range today.   Goal of Therapy:  Phosphorus 2.3-4.6  Plan:  KPhos 20 mMol IV x1 today F/U Phos, Bmet in am  Biagio Borg 12/31/2013,12:58 PM

## 2013-12-31 NOTE — Progress Notes (Signed)
2 Days Post-Op  Subjective: Is passing gas. Tolerating full liquid diet well.  Objective: Vital signs in last 24 hours: Temp:  [98 F (36.7 C)-99.1 F (37.3 C)] 99.1 F (37.3 C) (06/24 0536) Pulse Rate:  [51-76] 65 (06/24 0536) Resp:  [16-20] 20 (06/24 0536) BP: (95-147)/(36-70) 147/64 mmHg (06/24 0536) SpO2:  [94 %-99 %] 94 % (06/24 0536) Last BM Date: 12/29/13  Intake/Output from previous day: 06/23 0701 - 06/24 0700 In: 3163.3 [P.O.:840; I.V.:2323.3] Out: 3400 [Urine:3400] Intake/Output this shift: Total I/O In: -  Out: 350 [Urine:350]  General appearance: alert, cooperative and no distress Resp: clear to auscultation bilaterally Cardio: regular rate and rhythm, S1, S2 normal, no murmur, click, rub or gallop GI: Soft. Bowel sounds appreciated. Incision healing well.  Lab Results:   Recent Labs  12/30/13 0533 12/31/13 0542  WBC 9.0 8.2  HGB 10.3* 10.8*  HCT 32.4* 33.8*  PLT 259 255   BMET  Recent Labs  12/30/13 0533 12/31/13 0542  NA 142 143  K 4.6 4.4  CL 105 106  CO2 27 28  GLUCOSE 92 108*  BUN 8 5*  CREATININE 0.70 0.67  CALCIUM 8.7 8.4   PT/INR No results found for this basename: LABPROT, INR,  in the last 72 hours  Studies/Results: No results found.  Anti-infectives: Anti-infectives   Start     Dose/Rate Route Frequency Ordered Stop   12/29/13 0930  cefoTEtan in Dextrose 5% (CEFOTAN) IVPB 2 g     2 g Intravenous  Once 12/29/13 0915 12/29/13 1003   12/29/13 0915  cefoTEtan (CEFOTAN) 1 g in dextrose 5 % 50 mL IVPB  Status:  Discontinued     1 g 100 mL/hr over 30 Minutes Intravenous  Once 12/29/13 0908 12/29/13 0915      Assessment/Plan: s/p Procedure(s): RIGHT HEMICOLECTOMY Impression: Postoperative day 2, status post right hemicolectomy. Hypophosphatemia noted and will be addressed. Patient is trying to ambulate. Will advance to soft diet. Awaiting final pathology. Preoperative chest x-ray revealed a small lung nodule. A CT scan of the  chest will be performed today.  LOS: 2 days    JENKINS,MARK A 12/31/2013

## 2014-01-01 ENCOUNTER — Ambulatory Visit (INDEPENDENT_AMBULATORY_CARE_PROVIDER_SITE_OTHER): Payer: Medicare Other | Admitting: Internal Medicine

## 2014-01-01 LAB — BASIC METABOLIC PANEL
BUN: 5 mg/dL — AB (ref 6–23)
CHLORIDE: 104 meq/L (ref 96–112)
CO2: 29 meq/L (ref 19–32)
Calcium: 8.5 mg/dL (ref 8.4–10.5)
Creatinine, Ser: 0.75 mg/dL (ref 0.50–1.10)
GFR calc Af Amer: >90 mL/min (ref >90)
GFR calc non Af Amer: 86 mL/min — ABNORMAL LOW (ref >90)
Glucose, Bld: 121 mg/dL — ABNORMAL HIGH (ref 70–99)
Potassium: 4.4 mEq/L (ref 3.7–5.3)
SODIUM: 142 meq/L (ref 137–147)

## 2014-01-01 LAB — PHOSPHORUS: Phosphorus: 3.3 mg/dL (ref 2.3–4.6)

## 2014-01-01 MED ORDER — ONDANSETRON HCL 4 MG PO TABS: 4.0000 mg | ORAL_TABLET | Freq: Three times a day (TID) | ORAL | Status: DC | PRN

## 2014-01-01 MED ORDER — HYDROCODONE-ACETAMINOPHEN 5-325 MG PO TABS
1.0000 | ORAL_TABLET | ORAL | Status: DC | PRN
Start: 2014-01-01 — End: 2014-02-04

## 2014-01-01 NOTE — Progress Notes (Signed)
Pt and her husband verbalize understanding of d/c instructions, medications and follow up appts. Prescriptions were given to patient at time of d/c. IV d/c without complications. Pt has no questions at this time. Pt belonging policy was reviewed with patient and encouraged her to check the room thoroughly for belongings prior to d/c. Pt was d/c via wheelchair accompanied by NT and her husband who will be driving her home. Amanda Norris

## 2014-01-01 NOTE — Progress Notes (Signed)
[facility]erve for Phosphorus Replacement Indication: Hypophosphatemia  Allergies  Allergen Reactions  . Sulfa Antibiotics Other (See Comments)    "Sores in my mouth"    Patient Measurements: Height: 5\' 4"  (162.6 cm) Weight: 169 lb (76.658 kg) (blankets & pillows on bed, pt weighed herself at157lbs nude ) IBW/kg (Calculated) : 54.7  Vital Signs: Temp: 97.8 F (36.6 C) (06/25 0627) Temp src: Oral (06/25 0627) BP: 129/84 mmHg (06/25 0627) Pulse Rate: 70 (06/25 0627) Intake/Output from previous day: 06/24 0701 - 06/25 0700 In: 2059.2 [P.O.:340; I.V.:1212.5; IV Piggyback:506.7] Out: 2150 [Urine:1800; Emesis/NG output:50; Stool:300] Intake/Output from this shift:    Labs:  Recent Labs  12/30/13 0533 12/31/13 0542 01/01/14 0529  WBC 9.0 8.2  --   HGB 10.3* 10.8*  --   HCT 32.4* 33.8*  --   PLT 259 255  --   CREATININE 0.70 0.67 0.75  MG 2.0 2.0  --   PHOS 2.7 1.8* 3.3   Estimated Creatinine Clearance: 68.4 ml/min (by C-G formula based on Cr of 0.75).   Microbiology: No results found for this or any previous visit (from the past 720 hour(s)).  Medical History: Past Medical History  Diagnosis Date  . Arthritis   . PONV (postoperative nausea and vomiting)     Medications:  Scheduled:  . enoxaparin (LOVENOX) injection  40 mg Subcutaneous Q24H  . pantoprazole  40 mg Oral Q1200    Assessment: 68 yo F s/p right hemicolectomy on 6/22.   Phosphorus has been repleted.  Bmet wnl.   Pt had BM 6/24. Noted plans for discharge today.   Goal of Therapy:  Phosphorus 2.3-4.6  Plan:  No further phos replacement needed at this time. D/C Ente[facility] per policy  Biagio Borg 01/01/2014,8:43 AM

## 2014-01-01 NOTE — Discharge Summary (Signed)
Physician Discharge Summary  Patient ID: Amanda Norris MRN: [id number] DOB/AGE: Dec 26, 1945 68 y.o.  Admit date: 12/29/2013 Discharge date: 01/01/2014  Admission Diagnoses: Colon carcinoma  Discharge Diagnoses: T3, N2, M0 adenocarcinoma of colon, hypophosphatemia (resolved),  Active Problems:   Colon cancer   Discharged Condition: good  Hospital Course: Patient is a 68 year old white female who was found by colonoscopy to have an adenocarcinoma the proximal transverse colon. She underwent a right hemicolectomy on 12/29/2013. She tolerated the procedure well. Her postoperative course has been unremarkable. A CT scan of the chest was performed into the small nodules seen on her chest x-ray. Multiple calcified nodules were seen on chest CT, but they were too small to really differentiate. A followup CT scan of chest has been recommended in 6 months. Final pathology revealed a T3, N2, M0 adenocarcinoma the transverse colon. The patient has been made aware of the results. She is tolerating regular diet well and has had a bowel movement. She is being discharged home in good improving condition.  Treatments: surgery: Right hemicolectomy on 12/29/2013  Discharge Exam: Blood pressure 129/84, pulse 70, temperature 97.8 F (36.6 C), temperature source Oral, resp. rate 20, height 5\' 4"  (1.626 m), weight 76.658 kg (169 lb), SpO2 99.00%. General appearance: alert, cooperative and no distress Resp: clear to auscultation bilaterally Cardio: regular rate and rhythm, S1, S2 normal, no murmur, click, rub or gallop GI: Soft, flat. Active bowel sounds appreciated. Incision healing well.  Disposition: 01-Home or Self Care     Medication List         ACIDOPHILUS PO  Take 1 tablet by mouth daily.     BLACK COHOSH EXTRACT PO  Take 2 tablets by mouth daily.     CRANBERRY PO  Take 2 tablets by mouth daily.     ESTER C PO  Take 1,000 mg by mouth daily.     Fish Oil 1200 MG Caps  Take 2,400 mg by  mouth daily.     Garlic 7824 MG Caps  Take 1,000 mg by mouth daily.     HYDROcodone-acetaminophen 5-325 MG per tablet  Commonly known as:  NORCO/VICODIN  Take 1 tablet by mouth every 4 (four) hours as needed for moderate pain.     ibuprofen 200 MG tablet  Commonly known as:  ADVIL,MOTRIN  Take 400 mg by mouth daily as needed for moderate pain.     loratadine 10 MG tablet  Commonly known as:  CLARITIN  Take 10 mg by mouth daily.     multivitamin tablet  Take 1 tablet by mouth daily.     ondansetron 4 MG tablet  Commonly known as:  ZOFRAN  Take 1 tablet (4 mg total) by mouth every 8 (eight) hours as needed for nausea or vomiting.           Follow-up Information   Follow up with Jamesetta So, MD. Schedule an appointment as soon as possible for a visit on 01/08/2014.   Specialty:  General Surgery   Contact information:   [address] [place] [address] [phone]       Signed: Aviva Signs A 01/01/2014, 8:37 AM

## 2014-01-01 NOTE — Discharge Instructions (Signed)
Open Colectomy, Care After °Refer to this sheet in the next few weeks. These instructions provide you with information on caring for yourself after your procedure. Your health care provider may also give you more specific instructions. Your treatment has been planned according to current medical practices, but problems sometimes occur. Call your health care provider if you have any problems or questions after your procedure. °WHAT TO EXPECT AFTER THE PROCEDURE °After your procedure, it is typical to have the following: °· Pain in your abdomen, especially along your incision. You will be given medicines to control the pain. °· Tiredness. This is a normal part of the recovery process. Your energy level will return to normal over the next several weeks. °· Constipation. You may be given a stool softener to prevent this. °HOME CARE INSTRUCTIONS °· Only take over-the-counter or prescription medicines as directed by your health care provider. °· Ask your health care provider whether you may take a shower when you go home. °· You may resume a normal diet and activities as directed. Eat plenty of fruits and vegetables to help prevent constipation. °· Drink enough fluids to keep your urine clear or pale yellow. This also helps prevent constipation. °· Take rest breaks during the day as needed. °· Avoid lifting anything heavier than 25 pounds (11.3 kg) or driving for 4 weeks or until your health care provider says it is okay. °· Follow up with your health care provider as directed. Ask your health care provider when you need to return to have your stitches or staples removed. °SEEK MEDICAL CARE IF: °· You have redness, swelling, or increasing pain in the incision area. °· You see pus coming from the incision area. °· You have a fever. °SEEK IMMEDIATE MEDICAL CARE IF:  °· You have chest pain or shortness of breath. °· You have pain or swelling in your legs. °· You have persistent nausea and vomiting. °· Your wound breaks open  after stitches or staples have been removed. °· You have increasing abdominal pain that is not relieved with medicine. °Document Released: 01/17/2011 Document Revised: 04/16/2013 Document Reviewed: 02/05/2013 °[employer]® Patient Information ©2015 [employer]. This information is not intended to replace advice given to you by your health care provider. Make sure you discuss any questions you have with your health care provider. ° °

## 2014-01-02 LAB — TYPE AND SCREEN
ABO/RH(D): AB NEG
Antibody Screen: NEGATIVE
UNIT DIVISION: 0
Unit division: 0

## 2014-01-27 ENCOUNTER — Encounter (HOSPITAL_COMMUNITY): Payer: Self-pay

## 2014-01-27 ENCOUNTER — Encounter (HOSPITAL_COMMUNITY): Payer: Medicare Other | Attending: Hematology

## 2014-01-27 VITALS — BP 119/70 | HR 66 | Temp 98.7°F | Resp 16 | Ht 63.25 in | Wt 153.2 lb

## 2014-01-27 DIAGNOSIS — D649 Anemia, unspecified: Secondary | ICD-10-CM | POA: Insufficient documentation

## 2014-01-27 DIAGNOSIS — Z9049 Acquired absence of other specified parts of digestive tract: Secondary | ICD-10-CM | POA: Insufficient documentation

## 2014-01-27 DIAGNOSIS — C189 Malignant neoplasm of colon, unspecified: Secondary | ICD-10-CM

## 2014-01-27 DIAGNOSIS — C184 Malignant neoplasm of transverse colon: Secondary | ICD-10-CM | POA: Insufficient documentation

## 2014-01-27 DIAGNOSIS — R918 Other nonspecific abnormal finding of lung field: Secondary | ICD-10-CM | POA: Insufficient documentation

## 2014-01-27 LAB — CBC WITH DIFFERENTIAL/PLATELET
Basophils Absolute: 0 10*3/uL (ref 0.0–0.1)
Basophils Relative: 0 % (ref 0–1)
EOS ABS: 0.1 10*3/uL (ref 0.0–0.7)
Eosinophils Relative: 2 % (ref 0–5)
HEMATOCRIT: 34.6 % — AB (ref 36.0–46.0)
HEMOGLOBIN: 11 g/dL — AB (ref 12.0–15.0)
Lymphocytes Relative: 22 % (ref 12–46)
Lymphs Abs: 1.4 10*3/uL (ref 0.7–4.0)
MCH: 25.7 pg — AB (ref 26.0–34.0)
MCHC: 31.8 g/dL (ref 30.0–36.0)
MCV: 80.8 fL (ref 78.0–100.0)
MONO ABS: 0.6 10*3/uL (ref 0.1–1.0)
MONOS PCT: 9 % (ref 3–12)
NEUTROS PCT: 67 % (ref 43–77)
Neutro Abs: 4.2 10*3/uL (ref 1.7–7.7)
Platelets: 219 10*3/uL (ref 150–400)
RBC: 4.28 MIL/uL (ref 3.87–5.11)
RDW: 15.4 % (ref 11.5–15.5)
WBC: 6.3 10*3/uL (ref 4.0–10.5)

## 2014-01-27 LAB — COMPREHENSIVE METABOLIC PANEL
ALT: 12 U/L (ref 0–35)
AST: 17 U/L (ref 0–37)
Albumin: 3.6 g/dL (ref 3.5–5.2)
Alkaline Phosphatase: 77 U/L (ref 39–117)
Anion gap: 13 (ref 5–15)
BUN: 12 mg/dL (ref 6–23)
CHLORIDE: 105 meq/L (ref 96–112)
CO2: 23 meq/L (ref 19–32)
CREATININE: 0.65 mg/dL (ref 0.50–1.10)
Calcium: 9.4 mg/dL (ref 8.4–10.5)
GFR calc Af Amer: >90 mL/min (ref >90)
GFR, EST NON AFRICAN AMERICAN: 90 mL/min — AB (ref >90)
GLUCOSE: 114 mg/dL — AB (ref 70–99)
Potassium: 4 mEq/L (ref 3.7–5.3)
Sodium: 141 mEq/L (ref 137–147)
Total Protein: 7 g/dL (ref 6.0–8.3)

## 2014-01-27 LAB — MAGNESIUM: Magnesium: 2.2 mg/dL (ref 1.5–2.5)

## 2014-01-27 NOTE — Patient Instructions (Addendum)
[place] Discharge Instructions  RECOMMENDATIONS MADE BY THE CONSULTANT AND ANY TEST RESULTS WILL BE SENT TO YOUR REFERRING PHYSICIAN.  EXAM FINDINGS BY THE PHYSICIAN TODAY AND SIGNS OR SYMPTOMS TO REPORT TO CLINIC OR PRIMARY PHYSICIAN: Exam and findings as discussed by Dr. Tera Mater.  Will check some labs today and will get you scheduled for bone scan, port a catheter placement, chemotherapy teaching with Lupita Raider our nurse navigator and will see you back once all are completed to get your treatment plan developed and you started on chemotherapy.  Plans are to treat you with Oxaliplatin, 5 fluorouracil and leucovorin.    INSTRUCTIONS/FOLLOW-UP: Follow-up to be arranged.  Thank you for choosing [facility] to provide your oncology and hematology care.  To afford each patient quality time with our providers, please arrive at least 15 minutes before your scheduled appointment time.  With your help, our goal is to use those 15 minutes to complete the necessary work-up to ensure our physicians have the information they need to help with your evaluation and healthcare recommendations.    Effective January 1st, 2014, we ask that you re-schedule your appointment with our physicians should you arrive 10 or more minutes late for your appointment.  We strive to give you quality time with our providers, and arriving late affects you and other patients whose appointments are after yours.    Again, thank you for choosing [facility].  Our hope is that these requests will decrease the amount of time that you wait before being seen by our physicians.       _____________________________________________________________  Should you have questions after your visit to [facility], please contact our office at [phone] between the hours of 8:30 a.m. and 4:30 p.m.  Voicemails left after 4:30 p.m. will not be returned until the following business day.   For prescription refill requests, have your pharmacy contact our office with your prescription refill request.    _______________________________________________________________  We hope that we have given you very good care.  You may receive a patient satisfaction survey in the mail, please complete it and return it as soon as possible.  We value your feedback!  _______________________________________________________________  Have you asked about our STAR program?  STAR stands for Survivorship Training and Rehabilitation, and this is a nationally recognized cancer care program that focuses on survivorship and rehabilitation.  Cancer and cancer treatments may cause problems, such as, pain, making you feel tired and keeping you from doing the things that you need or want to do. Cancer rehabilitation can help. Our goal is to reduce these troubling effects and help you have the best quality of life possible.  You may receive a survey from a nurse that asks questions about your current state of health.  Based on the survey results, all eligible patients will be referred to the [facility] program for an evaluation so we can better serve you!  A frequently asked questions sheet is available upon request. Fluorouracil, 5-FU injection What is this medicine? FLUOROURACIL, 5-FU (flure oh YOOR a sil) is a chemotherapy drug. It slows the growth of cancer cells. This medicine is used to treat many types of cancer like breast cancer, colon or rectal cancer, pancreatic cancer, and stomach cancer. This medicine may be used for other purposes; ask your health care provider or pharmacist if you have questions. COMMON BRAND NAME(S): Adrucil What should I tell my health care provider before I take  this medicine? They need to know if you have any of these conditions: -blood disorders -dihydropyrimidine dehydrogenase (DPD) deficiency -infection (especially a virus infection such as chickenpox, cold sores, or  herpes) -kidney disease -liver disease -malnourished, poor nutrition -recent or ongoing radiation therapy -an unusual or allergic reaction to fluorouracil, other chemotherapy, other medicines, foods, dyes, or preservatives -pregnant or trying to get pregnant -breast-feeding How should I use this medicine? This drug is given as an infusion or injection into a vein. It is administered in a hospital or clinic by a specially trained health care professional. Talk to your pediatrician regarding the use of this medicine in children. Special care may be needed. Overdosage: If you think you have taken too much of this medicine contact a poison control center or emergency room at once. NOTE: This medicine is only for you. Do not share this medicine with others. What if I miss a dose? It is important not to miss your dose. Call your doctor or health care professional if you are unable to keep an appointment. What may interact with this medicine? -allopurinol -cimetidine -dapsone -digoxin -hydroxyurea -leucovorin -levamisole -medicines for seizures like ethotoin, fosphenytoin, phenytoin -medicines to increase blood counts like filgrastim, pegfilgrastim, sargramostim -medicines that treat or prevent blood clots like warfarin, enoxaparin, and dalteparin -methotrexate -metronidazole -pyrimethamine -some other chemotherapy drugs like busulfan, cisplatin, estramustine, vinblastine -trimethoprim -trimetrexate -vaccines Talk to your doctor or health care professional before taking any of these medicines: -acetaminophen -aspirin -ibuprofen -ketoprofen -naproxen This list may not describe all possible interactions. Give your health care provider a list of all the medicines, herbs, non-prescription drugs, or dietary supplements you use. Also tell them if you smoke, drink alcohol, or use illegal drugs. Some items may interact with your medicine. What should I watch for while using this  medicine? Visit your doctor for checks on your progress. This drug may make you feel generally unwell. This is not uncommon, as chemotherapy can affect healthy cells as well as cancer cells. Report any side effects. Continue your course of treatment even though you feel ill unless your doctor tells you to stop. In some cases, you may be given additional medicines to help with side effects. Follow all directions for their use. Call your doctor or health care professional for advice if you get a fever, chills or sore throat, or other symptoms of a cold or flu. Do not treat yourself. This drug decreases your body's ability to fight infections. Try to avoid being around people who are sick. This medicine may increase your risk to bruise or bleed. Call your doctor or health care professional if you notice any unusual bleeding. Be careful brushing and flossing your teeth or using a toothpick because you may get an infection or bleed more easily. If you have any dental work done, tell your dentist you are receiving this medicine. Avoid taking products that contain aspirin, acetaminophen, ibuprofen, naproxen, or ketoprofen unless instructed by your doctor. These medicines may hide a fever. Do not become pregnant while taking this medicine. Women should inform their doctor if they wish to become pregnant or think they might be pregnant. There is a potential for serious side effects to an unborn child. Talk to your health care professional or pharmacist for more information. Do not breast-feed an infant while taking this medicine. Men should inform their doctor if they wish to father a child. This medicine may lower sperm counts. Do not treat diarrhea with over the counter products. Contact your doctor  if you have diarrhea that lasts more than 2 days or if it is severe and watery. This medicine can make you more sensitive to the sun. Keep out of the sun. If you cannot avoid being in the sun, wear protective clothing  and use sunscreen. Do not use sun lamps or tanning beds/booths. What side effects may I notice from receiving this medicine? Side effects that you should report to your doctor or health care professional as soon as possible: -allergic reactions like skin rash, itching or hives, swelling of the face, lips, or tongue -low blood counts - this medicine may decrease the number of white blood cells, red blood cells and platelets. You may be at increased risk for infections and bleeding. -signs of infection - fever or chills, cough, sore throat, pain or difficulty passing urine -signs of decreased platelets or bleeding - bruising, pinpoint red spots on the skin, black, tarry stools, blood in the urine -signs of decreased red blood cells - unusually weak or tired, fainting spells, lightheadedness -breathing problems -changes in vision -chest pain -mouth sores -nausea and vomiting -pain, swelling, redness at site where injected -pain, tingling, numbness in the hands or feet -redness, swelling, or sores on hands or feet -stomach pain -unusual bleeding Side effects that usually do not require medical attention (report to your doctor or health care professional if they continue or are bothersome): -changes in finger or toe nails -diarrhea -dry or itchy skin -hair loss -headache -loss of appetite -sensitivity of eyes to the light -stomach upset -unusually teary eyes This list may not describe all possible side effects. Call your doctor for medical advice about side effects. You may report side effects to FDA at 1-800-FDA-1088. Where should I keep my medicine? This drug is given in a hospital or clinic and will not be stored at home. NOTE: This sheet is a summary. It may not cover all possible information. If you have questions about this medicine, talk to your doctor, pharmacist, or health care provider.  2015, Elsevier/[employer] Standard. (2007-10-30 13:53:16) Fluorouracil, 5-FU injection What is  this medicine? FLUOROURACIL, 5-FU (flure oh YOOR a sil) is a chemotherapy drug. It slows the growth of cancer cells. This medicine is used to treat many types of cancer like breast cancer, colon or rectal cancer, pancreatic cancer, and stomach cancer. This medicine may be used for other purposes; ask your health care provider or pharmacist if you have questions. COMMON BRAND NAME(S): Adrucil What should I tell my health care provider before I take this medicine? They need to know if you have any of these conditions: -blood disorders -dihydropyrimidine dehydrogenase (DPD) deficiency -infection (especially a virus infection such as chickenpox, cold sores, or herpes) -kidney disease -liver disease -malnourished, poor nutrition -recent or ongoing radiation therapy -an unusual or allergic reaction to fluorouracil, other chemotherapy, other medicines, foods, dyes, or preservatives -pregnant or trying to get pregnant -breast-feeding How should I use this medicine? This drug is given as an infusion or injection into a vein. It is administered in a hospital or clinic by a specially trained health care professional. Talk to your pediatrician regarding the use of this medicine in children. Special care may be needed. Overdosage: If you think you have taken too much of this medicine contact a poison control center or emergency room at once. NOTE: This medicine is only for you. Do not share this medicine with others. What if I miss a dose? It is important not to miss your dose. Call your doctor  or health care professional if you are unable to keep an appointment. What may interact with this medicine? -allopurinol -cimetidine -dapsone -digoxin -hydroxyurea -leucovorin -levamisole -medicines for seizures like ethotoin, fosphenytoin, phenytoin -medicines to increase blood counts like filgrastim, pegfilgrastim, sargramostim -medicines that treat or prevent blood clots like warfarin, enoxaparin, and  dalteparin -methotrexate -metronidazole -pyrimethamine -some other chemotherapy drugs like busulfan, cisplatin, estramustine, vinblastine -trimethoprim -trimetrexate -vaccines Talk to your doctor or health care professional before taking any of these medicines: -acetaminophen -aspirin -ibuprofen -ketoprofen -naproxen This list may not describe all possible interactions. Give your health care provider a list of all the medicines, herbs, non-prescription drugs, or dietary supplements you use. Also tell them if you smoke, drink alcohol, or use illegal drugs. Some items may interact with your medicine. What should I watch for while using this medicine? Visit your doctor for checks on your progress. This drug may make you feel generally unwell. This is not uncommon, as chemotherapy can affect healthy cells as well as cancer cells. Report any side effects. Continue your course of treatment even though you feel ill unless your doctor tells you to stop. In some cases, you may be given additional medicines to help with side effects. Follow all directions for their use. Call your doctor or health care professional for advice if you get a fever, chills or sore throat, or other symptoms of a cold or flu. Do not treat yourself. This drug decreases your body's ability to fight infections. Try to avoid being around people who are sick. This medicine may increase your risk to bruise or bleed. Call your doctor or health care professional if you notice any unusual bleeding. Be careful brushing and flossing your teeth or using a toothpick because you may get an infection or bleed more easily. If you have any dental work done, tell your dentist you are receiving this medicine. Avoid taking products that contain aspirin, acetaminophen, ibuprofen, naproxen, or ketoprofen unless instructed by your doctor. These medicines may hide a fever. Do not become pregnant while taking this medicine. Women should inform their  doctor if they wish to become pregnant or think they might be pregnant. There is a potential for serious side effects to an unborn child. Talk to your health care professional or pharmacist for more information. Do not breast-feed an infant while taking this medicine. Men should inform their doctor if they wish to father a child. This medicine may lower sperm counts. Do not treat diarrhea with over the counter products. Contact your doctor if you have diarrhea that lasts more than 2 days or if it is severe and watery. This medicine can make you more sensitive to the sun. Keep out of the sun. If you cannot avoid being in the sun, wear protective clothing and use sunscreen. Do not use sun lamps or tanning beds/booths. What side effects may I notice from receiving this medicine? Side effects that you should report to your doctor or health care professional as soon as possible: -allergic reactions like skin rash, itching or hives, swelling of the face, lips, or tongue -low blood counts - this medicine may decrease the number of white blood cells, red blood cells and platelets. You may be at increased risk for infections and bleeding. -signs of infection - fever or chills, cough, sore throat, pain or difficulty passing urine -signs of decreased platelets or bleeding - bruising, pinpoint red spots on the skin, black, tarry stools, blood in the urine -signs of decreased red blood cells -  unusually weak or tired, fainting spells, lightheadedness -breathing problems -changes in vision -chest pain -mouth sores -nausea and vomiting -pain, swelling, redness at site where injected -pain, tingling, numbness in the hands or feet -redness, swelling, or sores on hands or feet -stomach pain -unusual bleeding Side effects that usually do not require medical attention (report to your doctor or health care professional if they continue or are bothersome): -changes in finger or toe nails -diarrhea -dry or itchy  skin -hair loss -headache -loss of appetite -sensitivity of eyes to the light -stomach upset -unusually teary eyes This list may not describe all possible side effects. Call your doctor for medical advice about side effects. You may report side effects to FDA at 1-800-FDA-1088. Where should I keep my medicine? This drug is given in a hospital or clinic and will not be stored at home. NOTE: This sheet is a summary. It may not cover all possible information. If you have questions about this medicine, talk to your doctor, pharmacist, or health care provider.  2015, Elsevier/Gold Standard. (2007-10-30 13:53:16) Oxaliplatin Injection What is this medicine? OXALIPLATIN (ox AL i PLA tin) is a chemotherapy drug. It targets fast dividing cells, like cancer cells, and causes these cells to die. This medicine is used to treat cancers of the colon and rectum, and many other cancers. This medicine may be used for other purposes; ask your health care provider or pharmacist if you have questions. COMMON BRAND NAME(S): Eloxatin What should I tell my health care provider before I take this medicine? They need to know if you have any of these conditions: -kidney disease -an unusual or allergic reaction to oxaliplatin, other chemotherapy, other medicines, foods, dyes, or preservatives -pregnant or trying to get pregnant -breast-feeding How should I use this medicine? This drug is given as an infusion into a vein. It is administered in a hospital or clinic by a specially trained health care professional. Talk to your pediatrician regarding the use of this medicine in children. Special care may be needed. Overdosage: If you think you have taken too much of this medicine contact a poison control center or emergency room at once. NOTE: This medicine is only for you. Do not share this medicine with others. What if I miss a dose? It is important not to miss a dose. Call your doctor or health care professional  if you are unable to keep an appointment. What may interact with this medicine? -medicines to increase blood counts like filgrastim, pegfilgrastim, sargramostim -probenecid -some antibiotics like amikacin, gentamicin, neomycin, polymyxin B, streptomycin, tobramycin -zalcitabine Talk to your doctor or health care professional before taking any of these medicines: -acetaminophen -aspirin -ibuprofen -ketoprofen -naproxen This list may not describe all possible interactions. Give your health care provider a list of all the medicines, herbs, non-prescription drugs, or dietary supplements you use. Also tell them if you smoke, drink alcohol, or use illegal drugs. Some items may interact with your medicine. What should I watch for while using this medicine? Your condition will be monitored carefully while you are receiving this medicine. You will need important blood work done while you are taking this medicine. This medicine can make you more sensitive to cold. Do not drink cold drinks or use ice. Cover exposed skin before coming in contact with cold temperatures or cold objects. When out in cold weather wear warm clothing and cover your mouth and nose to warm the air that goes into your lungs. Tell your doctor if you get sensitive to  the cold. This drug may make you feel generally unwell. This is not uncommon, as chemotherapy can affect healthy cells as well as cancer cells. Report any side effects. Continue your course of treatment even though you feel ill unless your doctor tells you to stop. In some cases, you may be given additional medicines to help with side effects. Follow all directions for their use. Call your doctor or health care professional for advice if you get a fever, chills or sore throat, or other symptoms of a cold or flu. Do not treat yourself. This drug decreases your body's ability to fight infections. Try to avoid being around people who are sick. This medicine may increase your  risk to bruise or bleed. Call your doctor or health care professional if you notice any unusual bleeding. Be careful brushing and flossing your teeth or using a toothpick because you may get an infection or bleed more easily. If you have any dental work done, tell your dentist you are receiving this medicine. Avoid taking products that contain aspirin, acetaminophen, ibuprofen, naproxen, or ketoprofen unless instructed by your doctor. These medicines may hide a fever. Do not become pregnant while taking this medicine. Women should inform their doctor if they wish to become pregnant or think they might be pregnant. There is a potential for serious side effects to an unborn child. Talk to your health care professional or pharmacist for more information. Do not breast-feed an infant while taking this medicine. Call your doctor or health care professional if you get diarrhea. Do not treat yourself. What side effects may I notice from receiving this medicine? Side effects that you should report to your doctor or health care professional as soon as possible: -allergic reactions like skin rash, itching or hives, swelling of the face, lips, or tongue -low blood counts - This drug may decrease the number of white blood cells, red blood cells and platelets. You may be at increased risk for infections and bleeding. -signs of infection - fever or chills, cough, sore throat, pain or difficulty passing urine -signs of decreased platelets or bleeding - bruising, pinpoint red spots on the skin, black, tarry stools, nosebleeds -signs of decreased red blood cells - unusually weak or tired, fainting spells, lightheadedness -breathing problems -chest pain, pressure -cough -diarrhea -jaw tightness -mouth sores -nausea and vomiting -pain, swelling, redness or irritation at the injection site -pain, tingling, numbness in the hands or feet -problems with balance, talking, walking -redness, blistering, peeling or  loosening of the skin, including inside the mouth -trouble passing urine or change in the amount of urine Side effects that usually do not require medical attention (report to your doctor or health care professional if they continue or are bothersome): -changes in vision -constipation -hair loss -loss of appetite -metallic taste in the mouth or changes in taste -stomach pain This list may not describe all possible side effects. Call your doctor for medical advice about side effects. You may report side effects to FDA at 1-800-FDA-1088. Where should I keep my medicine? This drug is given in a hospital or clinic and will not be stored at home. NOTE: This sheet is a summary. It may not cover all possible information. If you have questions about this medicine, talk to your doctor, pharmacist, or health care provider.  2015, Elsevier/Gold Standard. (2008-01-21 17:22:47) Leucovorin injection What is this medicine? LEUCOVORIN (loo koe VOR in) is used to prevent or treat the harmful effects of some medicines. This medicine is used to  treat anemia caused by a low amount of folic acid in the body. It is also used with 5-fluorouracil (5-FU) to treat colon cancer. This medicine may be used for other purposes; ask your health care provider or pharmacist if you have questions. What should I tell my health care provider before I take this medicine? They need to know if you have any of these conditions: -anemia from low levels of vitamin B-12 in the blood -an unusual or allergic reaction to leucovorin, folic acid, other medicines, foods, dyes, or preservatives -pregnant or trying to get pregnant -breast-feeding How should I use this medicine? This medicine is for injection into a muscle or into a vein. It is given by a health care professional in a hospital or clinic setting. Talk to your pediatrician regarding the use of this medicine in children. Special care may be needed. Overdosage: If you think you  have taken too much of this medicine contact a poison control center or emergency room at once. NOTE: This medicine is only for you. Do not share this medicine with others. What if I miss a dose? This does not apply. What may interact with this medicine? -capecitabine -fluorouracil -phenobarbital -phenytoin -primidone -trimethoprim-sulfamethoxazole This list may not describe all possible interactions. Give your health care provider a list of all the medicines, herbs, non-prescription drugs, or dietary supplements you use. Also tell them if you smoke, drink alcohol, or use illegal drugs. Some items may interact with your medicine. What should I watch for while using this medicine? Your condition will be monitored carefully while you are receiving this medicine. This medicine may increase the side effects of 5-fluorouracil, 5-FU. Tell your doctor or health care professional if you have diarrhea or mouth sores that do not get better or that get worse. What side effects may I notice from receiving this medicine? Side effects that you should report to your doctor or health care professional as soon as possible: -allergic reactions like skin rash, itching or hives, swelling of the face, lips, or tongue -breathing problems -fever, infection -mouth sores -unusual bleeding or bruising -unusually weak or tired Side effects that usually do not require medical attention (report to your doctor or health care professional if they continue or are bothersome): -constipation or diarrhea -loss of appetite -nausea, vomiting This list may not describe all possible side effects. Call your doctor for medical advice about side effects. You may report side effects to FDA at 1-800-FDA-1088. Where should I keep my medicine? This drug is given in a hospital or clinic and will not be stored at home. NOTE: This sheet is a summary. It may not cover all possible information. If you have questions about this medicine,  talk to your doctor, pharmacist, or health care provider.  2015, [employer]/Gold Standard. (2007-12-31 16:50:29) Leucovorin injection What is this medicine? LEUCOVORIN (loo koe VOR in) is used to prevent or treat the harmful effects of some medicines. This medicine is used to treat anemia caused by a low amount of folic acid in the body. It is also used with 5-fluorouracil (5-FU) to treat colon cancer. This medicine may be used for other purposes; ask your health care provider or pharmacist if you have questions. What should I tell my health care provider before I take this medicine? They need to know if you have any of these conditions: -anemia from low levels of vitamin B-12 in the blood -an unusual or allergic reaction to leucovorin, folic acid, other medicines, foods, dyes, or preservatives -pregnant  or trying to get pregnant -breast-feeding How should I use this medicine? This medicine is for injection into a muscle or into a vein. It is given by a health care professional in a hospital or clinic setting. Talk to your pediatrician regarding the use of this medicine in children. Special care may be needed. Overdosage: If you think you have taken too much of this medicine contact a poison control center or emergency room at once. NOTE: This medicine is only for you. Do not share this medicine with others. What if I miss a dose? This does not apply. What may interact with this medicine? -capecitabine -fluorouracil -phenobarbital -phenytoin -primidone -trimethoprim-sulfamethoxazole This list may not describe all possible interactions. Give your health care provider a list of all the medicines, herbs, non-prescription drugs, or dietary supplements you use. Also tell them if you smoke, drink alcohol, or use illegal drugs. Some items may interact with your medicine. What should I watch for while using this medicine? Your condition will be monitored carefully while you are receiving this  medicine. This medicine may increase the side effects of 5-fluorouracil, 5-FU. Tell your doctor or health care professional if you have diarrhea or mouth sores that do not get better or that get worse. What side effects may I notice from receiving this medicine? Side effects that you should report to your doctor or health care professional as soon as possible: -allergic reactions like skin rash, itching or hives, swelling of the face, lips, or tongue -breathing problems -fever, infection -mouth sores -unusual bleeding or bruising -unusually weak or tired Side effects that usually do not require medical attention (report to your doctor or health care professional if they continue or are bothersome): -constipation or diarrhea -loss of appetite -nausea, vomiting This list may not describe all possible side effects. Call your doctor for medical advice about side effects. You may report side effects to FDA at 1-800-FDA-1088. Where should I keep my medicine? This drug is given in a hospital or clinic and will not be stored at home. NOTE: This sheet is a summary. It may not cover all possible information. If you have questions about this medicine, talk to your doctor, pharmacist, or health care provider.  2015, Elsevier/Gold Standard. (2007-12-31 16:50:29)

## 2014-01-27 NOTE — Progress Notes (Signed)
[facility] Physical Impairment and Functional Assessment Screening Tool  1. Are you having any pain, including headaches, joint pain, or muscle pain (upper body = OT; lower body = PT)?   Amanda Norris  ]  No   [    ]  Yes, but I hand this before my cancer diagnosis.   [    ]  Yes, this started after my diagnosis and is still a problem.   [    ]  Yes, this is new since my last visit. 2. Do your hands and/or feet feel numb or tingle (PT)?   Amanda Norris  ]  No   [    ]  Yes, but I hand this before my cancer diagnosis.   [    ]  Yes, this started after my diagnosis and is still a problem.   [    ]  Yes, this is new since my last visit. 3. Does any part of your body feel swollen or larger than usual (upper body = OT; lower body = PT)?   Amanda Norris  ]  No   [    ]  Yes, but I hand this before my cancer diagnosis.   [    ]  Yes, this started after my diagnosis and is still a problem.   [    ]  Yes, this is new since my last visit. 4. Are you so tired that you cannot do the things you want or need to do (PT or OT)?   [    ]  No   [    ]  Yes, but I hand this before my cancer diagnosis.   Amanda Norris    ]  Yes, this started after my diagnosis and is still a problem.   [    ]  Yes, this is new since my last visit. 5. Are you feeling weak or are you having trouble moving any part of your body (PT/OT)?   Amanda Norris    ]  No   [    ]  Yes, but I hand this before my cancer diagnosis.   [    ]  Yes, this started after my diagnosis and is still a problem.   [    ]  Yes, this is new since my last visit. 6. Are you having trouble concentrating, thinking, or remembering things (OT/ST)?   Amanda Norris  ]  No   [    ]  Yes, but I hand this before my cancer diagnosis.   [    ]  Yes, this started after my diagnosis and is still a problem.   [    ]  Yes, this is new since my last visit. 7. Are you having trouble moving around or feel like you might trip or fall (PT)?   Amanda Norris  ]  No   [    ]  Yes, but I hand this before my cancer diagnosis.   [    ]  Yes, this  started after my diagnosis and is still a problem.   [    ]  Yes, this is new since my last visit. 8. Are you having trouble swallowing (ST)?   Amanda Norris    ]  No   [    ]  Yes, but I hand this before my cancer diagnosis.   [    ]  Yes, this started after my diagnosis and is still a problem.   [    ]  Yes, this is new since my last visit. 9. Are you having trouble speaking (ST)?   Amanda Norris  ]  No   [    ]  Yes, but I hand this before my cancer diagnosis.   [    ]  Yes, this started after my diagnosis and is still a problem.   [    ]  Yes, this is new since my last visit. 10. Are you having trouble with going or getting to the bathroom (OT)?   Amanda Norris  ]  No   [    ]  Yes, but I hand this before my cancer diagnosis.   [    ]  Yes, this started after my diagnosis and is still a problem.   [    ]  Yes, this is new since my last visit. 11. Are you having trouble with your sexual function (OT)?   Amanda Norris  ]  No   [    ]  Yes, but I hand this before my cancer diagnosis.   [    ]  Yes, this started after my diagnosis and is still a problem.   [    ]  Yes, this is new since my last visit. 12. Are you having trouble lifting things, even just your arms (OT/PT)?   Amanda Norris  ]  No   [    ]  Yes, but I hand this before my cancer diagnosis.   [    ]  Yes, this started after my diagnosis and is still a problem.   [    ]  Yes, this is new since my last visit. 74. Are you having trouble taking care of yourself as in dressing or bathing (OT)?   Amanda Norris  ]  No   [    ]  Yes, but I hand this before my cancer diagnosis.   [    ]  Yes, this started after my diagnosis and is still a problem.   [    ]  Yes, this is new since my last visit. 14. Are you having trouble with daily tasks like chores or shopping (OT)?   Amanda Norris  ]  No   [    ]  Yes, but I hand this before my cancer diagnosis.   [    ]  Yes, this started after my diagnosis and is still a problem.   [    ]  Yes, this is new since my last visit. 15. Are you having trouble driving  (OT)?   Amanda Norris  ]  No   [    ]  Yes, but I hand this before my cancer diagnosis.   [    ]  Yes, this started after my diagnosis and is still a problem.   [    ]  Yes, this is new since my last visit. 57. Are you having trouble returning to work or completing your tasks at work (OT)?   Amanda Norris  ]  No   [    ]  Yes, but I hand this before my cancer diagnosis.   [    ]  Yes, this started after my diagnosis and is still a problem.   [    ]  Yes, this is new since my last visit.  Other concerns:  Dx. With adenocarcinoma of colon on 12/12/13 with colonoscopy.  On 12/29/13 had right hemicolectomy and continues to have increased fatigue and decreased  stamina.  Will be starting chemotherapy in the next couple of weeks.  Legend: OT = Occupational Therapy PT = Physical Therapy ST = Speech Therapy

## 2014-01-27 NOTE — Progress Notes (Signed)
Amanda Norris's reason for visit today is for labs as scheduled per MD orders.  Venipuncture performed with a 23 gauge butterfly needle to left hand.  Amanda Norris tolerated procedure well and without incident; questions were answered and patient was discharged.

## 2014-01-27 NOTE — Progress Notes (Signed)
[facility]   NEW PATIENT EVALUATION    Name: Amanda Norris Date: 01/27/2014 MRN: [id number] DOB: 26-Jan-1946  PCP: Glo Herring., MD   REFERRING PHYSICIANS: Redmond School, MD, Aviva Signs, MD  REASON FOR REFERRAL: Adenocarcinoma of the proximal transverse colon, Stage IIIB     HEMATOLOGY/ONCOLOGY HX: Amanda Norris is a pleasant and otherwise healthy 68 yo female who was found by colonoscopy to have a nonobstructing mass in the transverse colon. A right hemicolectomy was performed by Dr. Arnoldo Morale on 12/29/13. She had been symptomatic with bloating and change in bowel habits. Preoperatively had a CT scan with abnormal changes in the proximal transverse colon. Final pathology revealed a T3, N2, M0 moderately differentiated adenocarcinoma. She denied bleeding and was not anemic on admission. Amanda Norris' ost operative course has been uneventful. There were no findings of intraabdominal metastasis.   Preoperatively, she was also evaluated for an abnormality in the head of the pancreas including an MRI scan and endoscopic ultrasound by Dr. Ardis Hughs.The lesion was cystic and morphologically felt to be benign. Tumor markers obtained showed a CEA of 3.8 and CA 19-9 of 20. There is no family history of colon cancer. A maternal aunt was diagnosed with pancreatic cancer.  PAST MEDICAL HISTORY:  has a past medical history of Arthritis; PONV (postoperative nausea and vomiting); and Colon cancer.     PAST SURGICAL HISTORY: Past Surgical History  Procedure Laterality Date  . Herniated disc      [id number]  . Colonoscopy N/A 12/12/2013    Procedure: COLONOSCOPY;  Surgeon: Rogene Houston, MD;  Location: [facility];  Service: Endoscopy;  Laterality: N/A;  730  . Eus N/A 12/18/2013    Procedure: UPPER ENDOSCOPIC ULTRASOUND (EUS) LINEAR;  Surgeon: Milus Banister, MD;  Location: [facility];  Service: Endoscopy;  Laterality: N/A;  . Partial colectomy N/A 12/29/2013   Procedure: RIGHT HEMICOLECTOMY;  Surgeon: Jamesetta So, MD;  Location: [facility];  Service: General;  Laterality: N/A;     CURRENT MEDICATIONS: has a current medication list which includes the following prescription(s): bioflavonoid products, black cohosh, cranberry, garlic, ibuprofen, lactobacillus, multivitamin, fish oil, hydrocodone-acetaminophen, loratadine, and ondansetron.   ALLERGIES: Sulfa antibiotics   SOCIAL HISTORY:  reports that she quit smoking about 54 years ago. Her smoking use included Cigarettes. She has a 1.25 pack-year smoking history. She has never used smokeless tobacco. She reports that she does not drink alcohol or use illicit drugs.   FAMILY HISTORY: Noted above.   REVIEW OF SYSTEMS:   SINCE YOUR LAST VISIT Been diagnosed or treated for a new medical /surgical  problem or condition: Yes Any Recent Xrays or studies performed: Yes Any new prescription or OTC medications: No ECOG Perf Status: Restricted in physically strenuous activity but ambulatory and able to carry out work of a light or sedentary nature, e.g., light house work, office work Problems sleeping: Yes Medications taken to help sleep: No How is your appetie: 100% normal Any Supplements: No Any trouble chewing or swallowing: No Any Nausea or Vomiting: No Any Bowel problems: Yes (1st bm normal then gets looser as day progresses) # Bowel Movements per week: 10+ Any Urinary Issues: No Any Cardiac Problems: No Any Respiratory Issues: No Any Neurological Issues: No Do you live alone: No Feelings hopelessness: Yes (due to recent diagnosis) You or your family have any concerns or Health changes: Yes (What needs to be done now?)  Has not had a mammogram since 2004.  Other than that discussed above is noncontributory.    PHYSICAL EXAM:  height is 5' 3.25" (1.607 m) and weight is 153 lb 3.2 oz (69.491 kg). Her oral temperature is 98.7 F (37.1 C). Her blood pressure is 119/70 and her pulse is 66. Her  respiration is 16.    GENERAL:alert, no distress and comfortable SKIN: skin color, texture, turgor are normal, no rashes or significant lesions EYES: normal, Conjunctiva are pink and non-injected, sclera clear OROPHARYNX:no exudate, no erythema and lips, buccal mucosa, and tongue normal  NECK: supple, thyroid normal size, non-tender, without nodularity CHEST: No breast masses or skin changes  LYMPH:  no palpable lymphadenopathy in the cervical, axillary or inguinal LUNGS: clear to auscultation and percussion with normal breathing effort HEART: regular rate & rhythm and no murmurs ABDOMEN:abdomen soft, non-tender and normal bowel sounds, liver:and spleen not palpable. Midline incision is healed. MUSCULOSKELETAL: no cyanosis of digits, no clubbing or edema  NEURO: alert & oriented x 3 with fluent speech, no focal motor/sensory deficits,  SPINE: No localized tenderness  RIB CAGE: No localized tenderness except for:    LABORATORY DATA:  Office Visit on 01/27/2014  Component Date Value Ref Range Status  . WBC 01/27/2014 6.3  4.0 - 10.5 K/uL Final  . RBC 01/27/2014 4.28  3.87 - 5.11 MIL/uL Final  . Hemoglobin 01/27/2014 11.0* 12.0 - 15.0 g/dL Final  . HCT 01/27/2014 34.6* 36.0 - 46.0 % Final  . MCV 01/27/2014 80.8  78.0 - 100.0 fL Final  . MCH 01/27/2014 25.7* 26.0 - 34.0 pg Final  . MCHC 01/27/2014 31.8  30.0 - 36.0 g/dL Final  . RDW 01/27/2014 15.4  11.5 - 15.5 % Final  . Platelets 01/27/2014 219  150 - 400 K/uL Final  . Neutrophils Relative % 01/27/2014 67  43 - 77 % Final  . Neutro Abs 01/27/2014 4.2  1.7 - 7.7 K/uL Final  . Lymphocytes Relative 01/27/2014 22  12 - 46 % Final  . Lymphs Abs 01/27/2014 1.4  0.7 - 4.0 K/uL Final  . Monocytes Relative 01/27/2014 9  3 - 12 % Final  . Monocytes Absolute 01/27/2014 0.6  0.1 - 1.0 K/uL Final  . Eosinophils Relative 01/27/2014 2  0 - 5 % Final  . Eosinophils Absolute 01/27/2014 0.1  0.0 - 0.7 K/uL Final  . Basophils Relative 01/27/2014 0   0 - 1 % Final  . Basophils Absolute 01/27/2014 0.0  0.0 - 0.1 K/uL Final  Admission on 12/29/2013, Discharged on 01/01/2014  Component Date Value Ref Range Status  . Sodium 12/30/2013 142  137 - 147 mEq/L Final  . Potassium 12/30/2013 4.6  3.7 - 5.3 mEq/L Final  . Chloride 12/30/2013 105  96 - 112 mEq/L Final  . CO2 12/30/2013 27  19 - 32 mEq/L Final  . Glucose, Bld 12/30/2013 92  70 - 99 mg/dL Final  . BUN 12/30/2013 8  6 - 23 mg/dL Final  . Creatinine, Ser 12/30/2013 0.70  0.50 - 1.10 mg/dL Final  . Calcium 12/30/2013 8.7  8.4 - 10.5 mg/dL Final  . GFR calc non Af Amer 12/30/2013 88* >90 mL/min Final  . GFR calc Af Amer 12/30/2013 >90  >90 mL/min Final   Comment: (NOTE)                          The eGFR has been calculated using the CKD EPI equation.  This calculation has not been validated in all clinical situations.                          eGFR's persistently <90 mL/min signify possible Chronic Kidney                          Disease.  . Magnesium 12/30/2013 2.0  1.5 - 2.5 mg/dL Final  . Phosphorus 12/30/2013 2.7  2.3 - 4.6 mg/dL Final  . WBC 12/30/2013 9.0  4.0 - 10.5 K/uL Final  . RBC 12/30/2013 3.99  3.87 - 5.11 MIL/uL Final  . Hemoglobin 12/30/2013 10.3* 12.0 - 15.0 g/dL Final  . HCT 12/30/2013 32.4* 36.0 - 46.0 % Final  . MCV 12/30/2013 81.2  78.0 - 100.0 fL Final  . MCH 12/30/2013 25.8* 26.0 - 34.0 pg Final  . MCHC 12/30/2013 31.8  30.0 - 36.0 g/dL Final  . RDW 12/30/2013 13.7  11.5 - 15.5 % Final  . Platelets 12/30/2013 259  150 - 400 K/uL Final  . WBC 12/31/2013 8.2  4.0 - 10.5 K/uL Final  . RBC 12/31/2013 4.17  3.87 - 5.11 MIL/uL Final  . Hemoglobin 12/31/2013 10.8* 12.0 - 15.0 g/dL Final  . HCT 12/31/2013 33.8* 36.0 - 46.0 % Final  . MCV 12/31/2013 81.1  78.0 - 100.0 fL Final  . MCH 12/31/2013 25.9* 26.0 - 34.0 pg Final  . MCHC 12/31/2013 32.0  30.0 - 36.0 g/dL Final  . RDW 12/31/2013 13.9  11.5 - 15.5 % Final  . Platelets 12/31/2013 255   150 - 400 K/uL Final  . Sodium 12/31/2013 143  137 - 147 mEq/L Final  . Potassium 12/31/2013 4.4  3.7 - 5.3 mEq/L Final  . Chloride 12/31/2013 106  96 - 112 mEq/L Final  . CO2 12/31/2013 28  19 - 32 mEq/L Final  . Glucose, Bld 12/31/2013 108* 70 - 99 mg/dL Final  . BUN 12/31/2013 5* 6 - 23 mg/dL Final  . Creatinine, Ser 12/31/2013 0.67  0.50 - 1.10 mg/dL Final  . Calcium 12/31/2013 8.4  8.4 - 10.5 mg/dL Final  . GFR calc non Af Amer 12/31/2013 89* >90 mL/min Final  . GFR calc Af Amer 12/31/2013 >90  >90 mL/min Final   Comment: (NOTE)                          The eGFR has been calculated using the CKD EPI equation.                          This calculation has not been validated in all clinical situations.                          eGFR's persistently <90 mL/min signify possible Chronic Kidney                          Disease.  . Phosphorus 12/31/2013 1.8* 2.3 - 4.6 mg/dL Final  . Magnesium 12/31/2013 2.0  1.5 - 2.5 mg/dL Final  . Phosphorus 01/01/2014 3.3  2.3 - 4.6 mg/dL Final  . Sodium 01/01/2014 142  137 - 147 mEq/L Final  . Potassium 01/01/2014 4.4  3.7 - 5.3 mEq/L Final  . Chloride 01/01/2014 104  96 - 112 mEq/L Final  . CO2 01/01/2014 29  19 - 32 mEq/L Final  . Glucose, Bld 01/01/2014 121* 70 - 99 mg/dL Final  . BUN 01/01/2014 5* 6 - 23 mg/dL Final  . Creatinine, Ser 01/01/2014 0.75  0.50 - 1.10 mg/dL Final  . Calcium 01/01/2014 8.5  8.4 - 10.5 mg/dL Final  . GFR calc non Af Amer 01/01/2014 86* >90 mL/min Final  . GFR calc Af Amer 01/01/2014 >90  >90 mL/min Final   Comment: (NOTE)                          The eGFR has been calculated using the CKD EPI equation.                          This calculation has not been validated in all clinical situations.                          eGFR's persistently <90 mL/min signify possible Chronic Kidney                          Disease.   FINAL DIAGNOSIS PATHOLOGY:  FINAL DIAGNOSIS Diagnosis Colon, segmental resection for tumor,  right - INVASIVE COLORECTAL ADENOCARCINOMA, 7 CM, EXTENDING INTO PERICOLONIC CONNECTIVE TISSUE. - METASTATIC CARCINOMA IN 6 OF 33 LYMPH NODES (6/33). - MARGINS NOT INVOLVED. Microscopic Comment COLON AND RECTUM (INCLUDING TRANS-ANAL RESECTION): Specimen: Right colon. Procedure: Right colectomy. Tumor site: Transverse. Specimen integrity: Intact. Macroscopic intactness of mesorectum: Not applicable. Macroscopic tumor perforation: No Invasive tumor: Maximum size: 7 cm Histologic type(s): Colorectal adenocarcinoma. Histologic grade and differentiation: G2: moderately differentiated/low grade Type of polyp in which invasive carcinoma arose: No residual polyp. Microscopic extension of invasive tumor: Into pericolonic connective tissue. Lymph-Vascular invasion: Present. Peri-neural invasion: Present. Tumor deposit(s) (discontinuous extramural extension): No. Resection margins: Proximal margin: Free of tumor. Distal margin: Free of tumor. Circumferential (radial) (posterior ascending, posterior descending; lateral and posterior mid-rectum; and entire lower 1/3 rectum): N/A Mesenteric margin (sigmoid and transverse): Free of tumor. Distance closest margin (if all above margins negative): 6.5 cm from mesenteric margin. Treatment effect (neo-adjuvant therapy): No Additional polyp(s): No Non-neoplastic findings: Submucosal lipoma and benign appendix with fibrous obliteration of the lumen. 1 of 3 FINAL for Wenger, GIULLIANA MCROBERTS ([id number]) Microscopic Comment(continued) Lymph nodes: number examined 33; number positive: 6 Pathologic Staging: pT3, pN2a, MX Ancillary studies: Can be performed if requested. (JDP:ecj 12/31/2013) Claudette Laws MD Pathologist, Electronic Signature (Case signed 12/31/2013)     @[email or web address]  CLINICAL DATA: Abdominal pain with intermittent constipation and  diarrhea  EXAM:  CT ABDOMEN AND PELVIS WITH CONTRAST  TECHNIQUE:  Multidetector CT imaging of the abdomen  and pelvis was performed  using the standard protocol following bolus administration of  intravenous contrast. Oral contrast was also administered.  CONTRAST: 174m OMNIPAQUE IOHEXOL 300 MG/ML SOLN  COMPARISON: None.  FINDINGS:  There is pectus excavatum. There is scarring in the anterior left  base. On axial slice 4, series 3, there is a 4 mm nodular opacity in  the anterior segment right lower lobe. On axial slice 7 series 3,  there is a 3 mm nodular opacity in the posterior segment of the left  lower lobe. The lung bases are otherwise clear.  No focal liver lesions are identified. There is no biliary duct  dilatation. Gallbladder wall is not appreciably thickened. Spleen  appears normal.  There is decreased  attenuation in the head of the pancreas without  pancreatic duct dilatation or alteration in the contour that of the  pancreas. This area of decreased attenuation measures 2.7 x 2.6 cm.  Adrenals appear normal.  There is a 7 x 8 mm mass in the mid right kidney which has  attenuation values higher than expected with simple cyst. No other  renal mass is appreciable. There are extrarenal pelves bilaterally.  There is no calculus or hydronephrosis in either kidney. There is no  ureteral calculus or ureterectasis on either side.  In the pelvis, the urinary bladder is midline in location with  normal wall thickness. There is no pelvic mass or fluid collection.  The wall of the sigmoid colon is borderline thickened, plate due to  muscular hypertrophy from diverticulosis.  Appendix appears normal.  There is mucosal thickening in the proximal transverse colon with an  area that is concerning for potential neoplasm measuring 6.2 x 4.3  cm in length.  There is no bowel obstruction. No free air or portal venous air.  There is no ascites, adenopathy, or abscess in the abdomen or  pelvis. There is no abdominal aortic aneurysm. There are no blastic  or lytic bone lesions. There is moderate  disc protrusion and L4-5  diffusely.  IMPRESSION:  Area concerning for neoplasm in the proximal transverse colon. In  this area, there is focal wall thickening with what appears to be  some mucosal disruption. This area measures 6.2 x 4.3 cm in length.  Direct visualization is strongly advised. Note that there is some  subtle mesenteric thickening in this area; spread of tumor to the  immediate adjacent mesenteric region is suspected.  Questionable mass versus inflammation in the head of the pancreas.  MR of the pancreas with and without intravenous contrast advised to  further evaluate this region.  Subcentimeter mass in mid right kidney. This area should also be  evaluated at the time of MR of the pancreas with without intravenous  contrast material administration.  Small nodular lesions in the lung bases. Followup of these nodular  opacity should be based on Fleischner Society guidelines. If the  patient is at high risk for bronchogenic carcinoma, follow-up chest  CT at 1 year is recommended. If the patient is at low risk, no  follow-up is needed. This recommendation follows the consensus  statement: Guidelines for Management of Small Pulmonary Nodules  Detected on CT Scans: A Statement from the [place] as  published in Radiology 2005; 237:395-400.  Electronically Signed  By: Lowella Grip M.D.  On: 12/02/2013 09:16  EXAM:  MRI ABDOMEN WITHOUT AND WITH CONTRAST  TECHNIQUE:  Multiplanar multisequence MR imaging of the abdomen was performed  both before and after the administration of intravenous contrast.  CONTRAST: 47m MULTIHANCE GADOBENATE DIMEGLUMINE 529 MG/ML IV SOLN  COMPARISON: CT from 12/02/2013  FINDINGS:  Lung Bases: The unremarkable.  Liver: No evidence for an enhancing lesion within the liver.  Spleen: Normal  Stomach: Normal.  Pancreas: 1.8 x 0.9 cm cystic lesion is identified in the posterior  pancreatic head (see image 32 series 23). Appears to  communicate  with the pancreatic duct. Immediately anterior to this is a 1.0 x  2.3 cm focus of T1 hypointensity on pre contrast imaging and is  hypovascular relative to the pancreatic parenchyma on postcontrast  imaging (see 56 of series 21). There is no dilatation of the main  pancreatic duct.  Gallbladder/Biliary Tree: Layering sludge is seen in the lumen  of  the gallbladder without evidence for gallstones. No intra or  extrahepatic biliary duct dilatation.  Kidneys/Adrenals: No adrenal nodule or mass. No evidence for an  enhancing mass in either kidney. The tiny lesions seen in the  interpolar right kidney on the recent CT scan measure 7 mm on the  MRI. This shows no enhancement after contrast administration and has  imaging characteristics compatible with a renal cyst.  Bowel Loops: Duodenum is normal. The abnormal segment of proximal  transverse colon seen on the CT scan has not been included on this  study.  Nodes: No lymphadenopathy evident on this study. Please see recent  CT report for description of the omental nodularity.  Vasculature: No abdominal aortic aneurysm.  Bones/Musculoskeletal: No abnormal enhancement within the visualized  bony structures.  Body Wall: Unremarkable  Other: No evidence for intraperitoneal free fluid.  IMPRESSION:  18 x 9 mm cystic lesion in the posterior pancreatic head appears to  communicate with the main pancreatic duct. Side branch IPMN would be  a distinct consideration. Immediately anterior to this nonenhancing  cystic lesion is a 10 x 23 mm subtle area of altered signal and hypo  enhancement within the pancreatic head. . If the patient is to have  PET-CT for the colonic lesion, attention to this area on PET imaging  would be recommended. If the patient is not to have PET imaging, the  EUS could be considered. Alternatively, followup MRI in 3 months  could be used to assess stability.  Electronically Signed  By: Misty Stanley M.D.  On:  12/05/2013 15:19     Ct Chest Wo Contrast  12/31/2013   CLINICAL DATA:  lung nodule on xray, colon cancer, right hemicolectomy 12/29/2013  EXAM: CT CHEST WITHOUT CONTRAST  TECHNIQUE: Multidetector CT imaging of the chest was performed following the standard protocol without IV contrast.  COMPARISON:  Radiographs 12/25/2013 and earlier studies  FINDINGS: 4 mm benign calcified subpleural granuloma, lateral left upper lobe image 40/4. Posterior pleural-based scarring at the right lung apex stable since 08/27/2012.  Multiple small pulmonary nodules:  6 mm superior segment right lower lobe  image 29/3.  5 mm sub solid nodule, anterior left upper lobe image 28/4.  4 mm subpleural nodule, lateral right upper lobe image 23/3.  3 mm nodule, anterior right upper lobe image 16/3.  3 mm sub solid, right apex image 45/5  3 mm pleural based nodule, lateral basal segment right lower lobe image 44/3  No focal airspace consolidation. Trace bilateral pleural effusions. Calcified subcarinal lymph nodes. No definite hilar or mediastinal adenopathy, sensitivity decreased without IV contrast.  There a small amount of perihepatic ascites. There is a small amount of free intraperitoneal gas consistent with recent surgical history. Degenerative changes visualized lower cervical spine, and mild spondylitic changes in the mid thoracic spine.  IMPRESSION: 1. Multiple small bilateral pulmonary nodules as above, may represent benign granulomatous disease (suggested by the calcified left upper lobe nodule and calcified sub-carinal lymph nodes) versus metastatic disease given history of primary carcinoma. These likely are too small for biopsy or accurate PET-CT assessment. Consider six-month follow-up CT to assess for stability. 2. Small bilateral pleural effusions. 3. Perihepatic ascites and some free intraperitoneal gas, consistent with recent surgical history.   Electronically Signed   By: Arne Cleveland M.D.   On: 12/31/2013 11:31       IMPRESSION:   1. Stage IIIB Moderately differentiated adenocarcinoma of the colon with locally advanced disease and clear margins.  2. Right Hemicolectomy 94/39/12 uncomplicated. 3. Post operative mild anemia 4. Multiple small lung nodules unclear etiology. Patient is a nonsmoker 5. History of cervical disc disease       RECOMMENDATIONS:  1. I discussed the diagnosis and prognosis of Amanda Norris's colon cancer. Applying the [facility] colorectal cancer nomogram, Amanda Norris's 5 year probability of being disease free is 27%. I discussed the use of 6 cycles of outpatient adjuvant chemotherapy with FOLFOX. The potential benefit is approximately a 50% reduction in her risk for a recurrence. Amanda Norris is agreeable to meeting our nurse navigator to review the details of treatment and drug side effects which I did discuss in some detail. Amanda Norris will be referred to Dr. Arnoldo Morale for a Port-A-Cath. She will have blood tests today and a bone scan is to be scheduled for completeness. As noted above, a repeat Chest CT scan is recommended for future followup of the small lung nodules. She is advised to take oral iron.  Thank you for referring this nice lady to the cancer center for further care.  Darrall Dears, MD 01/27/2014 5:35 PM

## 2014-01-28 ENCOUNTER — Telehealth (HOSPITAL_COMMUNITY): Payer: Self-pay

## 2014-01-28 ENCOUNTER — Other Ambulatory Visit (HOSPITAL_COMMUNITY): Payer: Self-pay

## 2014-01-28 LAB — CEA: CEA: 2.3 ng/mL (ref 0.0–5.0)

## 2014-01-28 MED ORDER — LIDOCAINE-PRILOCAINE 2.5-2.5 % EX CREA: TOPICAL_CREAM | CUTANEOUS | Status: DC

## 2014-01-28 MED ORDER — PROCHLORPERAZINE MALEATE 10 MG PO TABS: 10.0000 mg | ORAL_TABLET | Freq: Four times a day (QID) | ORAL | Status: DC | PRN

## 2014-01-28 NOTE — Telephone Encounter (Signed)
Patient notified regarding appointment with Dr. Arnoldo Morale tomorrow, bone scan on Monday and to come for chemotherapy teaching with Lupita Raider on Monday after injection for scan.  To start taking Slow Iron 1 tablet daily as recommended by Dr. Bubba Hales.  Verbalized understanding of instructions.

## 2014-01-28 NOTE — Patient Instructions (Addendum)
Amanda Norris   CHEMOTHERAPY INSTRUCTIONS  Oxaliplatin - anaphylactic reaction, neurotoxicity (i.e., headache, fatigue, difficulty sleeping, pain). Peripheral neuropathy (numbness/tingling/burning in hands/fingers/feet/toes) - will be aggravated by cold/cool temperatures. We need to know when you develop peripheral neuropathy so that we can monitor it and treat if necessary. Nausea/vomiting, diarrhea, bone marrow suppression (lowers white blood cells (fight infection), lowers red blood cells (make up your blood), lowers platelets (help blood to clot). Pulmonary fibrosis. Once you have received Oxaliplatin do NOT eat or drinking anything cold/cool for 5-10 days! Do NOT breathe in cold/cool air and do NOT touch anything cold for 5-10 days. The time frame varies from patient to patient on the length of time you must abstain from the above mentioned. Best advice is to wait at least 5 days before attempting to reintroduce cold/cool back into life. Slowly reintroduce cool/cold things! Wear gloves when getting items out of the refrigerator (of course, these would be things you are going to heat to eat)! (This medication takes 2 hours to infuse)  Leucovorin - this is a medication that is not chemo but given with chemo. This med "rescues" the healthy cells before we administer the drug 5FU. This makes the 5FU work better.   5FU: bone marrow suppression (low white blood cells - wbcs fight infection, low red blood cells - rbcs make up your blood, low platelets - this is what makes your blood clot, nausea/vomiting, diarrhea, mouth sores, hair loss, dry skin, ocular toxicities (increased tear production, sensitivity to light). You must wear sunscreen/sunglasses. Cover your skin when out in sunlight. You will get burned very easily.  (This medication will be on a pump and infuse over 46 hours)  POTENTIAL SIDE EFFECTS OF TREATMENT: Increased Susceptibility to Infection, Vomiting,  Constipation, Hair Thinning, Changes in Character of Skin and Nails (brittleness, dryness,etc.), Bone Marrow Suppression, Blood in Urine, Complete Hair Loss, Nausea, Diarrhea, Sun Sensitivity and Mouth Sores   EDUCATIONAL MATERIALS GIVEN AND REVIEWED: Chemotherapy and You booklet Specific Instructions Sheets: Oxaliplatin, Leucovorin, 5FU, Dexamethasone, Zofran, Compazine, EMLA cream   SELF CARE ACTIVITIES WHILE ON CHEMOTHERAPY: Increase your fluid intake 48 hours prior to treatment and drink at least 2 quarts per day after treatment., No alcohol intake., No aspirin or other medications unless approved by your oncologist., Eat foods that are light and easy to digest., Eat foods at cold or room temperature., No fried, fatty, or spicy foods immediately before or after treatment., Have teeth cleaned professionally before starting treatment. Keep dentures and partial plates clean., Use soft toothbrush and do not use mouthwashes that contain alcohol. Biotene is a good mouthwash that is available at most pharmacies or may be ordered by calling [phone]., Use warm salt water gargles (1 teaspoon salt per 1 quart warm water) before and after meals and at bedtime. Or you may rinse with 2 tablespoons of three -percent hydrogen peroxide mixed in eight ounces of water., Always use sunscreen with SPF (Sun Protection Factor) of 30 or higher., Use your nausea medication as directed to prevent nausea., Use your stool softener or laxative as directed to prevent constipation. and Use your anti-diarrheal medication as directed to stop diarrhea.  Please wash your hands for at least 30 seconds using warm soapy water. Handwashing is the #1 way to prevent the spread of germs. Stay away from sick people or people who are getting over a cold. If you develop respiratory systems such as green/yellow mucus production or productive cough or persistent cough  let us know and we will see if you need an antibiotic. It is a good idea  to keep a pair of gloves on when going into grocery stores/[employer] to decrease your risk of coming into contact with germs on the carts, etc. Carry alcohol hand gel with you at all times and use it frequently if out in public. All foods need to be cooked thoroughly. No raw foods. No medium or undercooked meats, eggs. If your food is cooked medium well, it does not need to be hot pink or saturated with bloody liquid at all. Vegetables and fruits need to be washed/rinsed under the faucet with a dish detergent before being consumed. You can eat raw fruits and vegetables unless we tell you otherwise but it would be best if you cooked them or bought frozen. Do not eat off of salad bars or hot bars unless you really trust the cleanliness of the restaurant. If you need dental work, please let us know before you go for your appointment so that we can coordinate the best possible time for you in regards to your chemo regimen. You need to also let your dentist know that you are actively taking chemo. We may need to do labs prior to your dental appointment. We also want your bowels moving at least every other day. If this is not happening, we need to know so that we can get you on a bowel regimen to help you go.    MEDICATIONS: You have been given prescriptions for the following medications:  EMLA cream. Apply a quarter sized amount to port site 1 hour prior to chemo. Do not rub in. Cover with plastic wrap.   Compazine (prochlorperazine) 10mg  tablet. May take 1 tablet 4 times a day if needed for nausea/vomiting.   You may continue to take your Zofran for nausea also.   Over-the-Counter Meds:  Colace - this is a stool softener. Take 100mg  capsule 2-6 times a day as needed. If you have to take more than 6 capsules of Colace a day call the [facility].  Senna - this is a mild laxative used to treat mild constipation. May take 2 tabs by mouth daily or up to twice a day as needed for mild constipation.  Milk of  Magnesia - this is a laxative used to treat moderate to severe constipation. May take 2-4 tablespoons every 8 hours as needed. May increase to 8 tablespoons x 1 dose and if no bowel movement call the [facility].  Imodium - this is for diarrhea. Take 2 tabs after 1st loose stool and then 1 tab after each loose stool until you go a total of 12 hours without a loose stool. Call [facility] if loose stools continue.   SYMPTOMS TO REPORT AS SOON AS POSSIBLE AFTER TREATMENT:  FEVER GREATER THAN 100.5 F  CHILLS WITH OR WITHOUT FEVER  NAUSEA AND VOMITING THAT IS NOT CONTROLLED WITH YOUR NAUSEA MEDICATION  UNUSUAL SHORTNESS OF BREATH  UNUSUAL BRUISING OR BLEEDING  TENDERNESS IN MOUTH AND THROAT WITH OR WITHOUT PRESENCE OF ULCERS  URINARY PROBLEMS  BOWEL PROBLEMS  UNUSUAL RASH    Wear comfortable clothing and clothing appropriate for easy access to any Portacath or PICC line. Let us know if there is anything that we can do to make your therapy better!      I have been informed and understand all of the instructions given to me and have received a copy. I have been instructed to call the  clinic [phone] or my family physician as soon as possible for continued medical care, if indicated. I do not have any more questions at this time but understand that I may call the [facility] or the Patient Navigator at [phone] during office hours should I have questions or need assistance in obtaining follow-up care.       Oxaliplatin Injection What is this medicine? OXALIPLATIN (ox AL i PLA tin) is a chemotherapy drug. It targets fast dividing cells, like cancer cells, and causes these cells to die. This medicine is used to treat cancers of the colon and rectum, and many other cancers. This medicine may be used for other purposes; ask your health care provider or pharmacist if you have questions. COMMON BRAND NAME(S): Eloxatin What should I tell my health care provider before  I take this medicine? They need to know if you have any of these conditions: -kidney disease -an unusual or allergic reaction to oxaliplatin, other chemotherapy, other medicines, foods, dyes, or preservatives -pregnant or trying to get pregnant -breast-feeding How should I use this medicine? This drug is given as an infusion into a vein. It is administered in a hospital or clinic by a specially trained health care professional. Talk to your pediatrician regarding the use of this medicine in children. Special care may be needed. Overdosage: If you think you have taken too much of this medicine contact a poison control center or emergency room at once. NOTE: This medicine is only for you. Do not share this medicine with others. What if I miss a dose? It is important not to miss a dose. Call your doctor or health care professional if you are unable to keep an appointment. What may interact with this medicine? -medicines to increase blood counts like filgrastim, pegfilgrastim, sargramostim -probenecid -some antibiotics like amikacin, gentamicin, neomycin, polymyxin B, streptomycin, tobramycin -zalcitabine Talk to your doctor or health care professional before taking any of these medicines: -acetaminophen -aspirin -ibuprofen -ketoprofen -naproxen This list may not describe all possible interactions. Give your health care provider a list of all the medicines, herbs, non-prescription drugs, or dietary supplements you use. Also tell them if you smoke, drink alcohol, or use illegal drugs. Some items may interact with your medicine. What should I watch for while using this medicine? Your condition will be monitored carefully while you are receiving this medicine. You will need important blood work done while you are taking this medicine. This medicine can make you more sensitive to cold. Do not drink cold drinks or use ice. Cover exposed skin before coming in contact with cold temperatures or cold  objects. When out in cold weather wear warm clothing and cover your mouth and nose to warm the air that goes into your lungs. Tell your doctor if you get sensitive to the cold. This drug may make you feel generally unwell. This is not uncommon, as chemotherapy can affect healthy cells as well as cancer cells. Report any side effects. Continue your course of treatment even though you feel ill unless your doctor tells you to stop. In some cases, you may be given additional medicines to help with side effects. Follow all directions for their use. Call your doctor or health care professional for advice if you get a fever, chills or sore throat, or other symptoms of a cold or flu. Do not treat yourself. This drug decreases your body's ability to fight infections. Try to avoid being around people who are sick. This medicine may increase  your risk to bruise or bleed. Call your doctor or health care professional if you notice any unusual bleeding. Be careful brushing and flossing your teeth or using a toothpick because you may get an infection or bleed more easily. If you have any dental work done, tell your dentist you are receiving this medicine. Avoid taking products that contain aspirin, acetaminophen, ibuprofen, naproxen, or ketoprofen unless instructed by your doctor. These medicines may hide a fever. Do not become pregnant while taking this medicine. Women should inform their doctor if they wish to become pregnant or think they might be pregnant. There is a potential for serious side effects to an unborn child. Talk to your health care professional or pharmacist for more information. Do not breast-feed an infant while taking this medicine. Call your doctor or health care professional if you get diarrhea. Do not treat yourself. What side effects may I notice from receiving this medicine? Side effects that you should report to your doctor or health care professional as soon as possible: -allergic reactions  like skin rash, itching or hives, swelling of the face, lips, or tongue -low blood counts - This drug may decrease the number of white blood cells, red blood cells and platelets. You may be at increased risk for infections and bleeding. -signs of infection - fever or chills, cough, sore throat, pain or difficulty passing urine -signs of decreased platelets or bleeding - bruising, pinpoint red spots on the skin, black, tarry stools, nosebleeds -signs of decreased red blood cells - unusually weak or tired, fainting spells, lightheadedness -breathing problems -chest pain, pressure -cough -diarrhea -jaw tightness -mouth sores -nausea and vomiting -pain, swelling, redness or irritation at the injection site -pain, tingling, numbness in the hands or feet -problems with balance, talking, walking -redness, blistering, peeling or loosening of the skin, including inside the mouth -trouble passing urine or change in the amount of urine Side effects that usually do not require medical attention (report to your doctor or health care professional if they continue or are bothersome): -changes in vision -constipation -hair loss -loss of appetite -metallic taste in the mouth or changes in taste -stomach pain This list may not describe all possible side effects. Call your doctor for medical advice about side effects. You may report side effects to FDA at 1-800-FDA-1088. Where should I keep my medicine? This drug is given in a hospital or clinic and will not be stored at home. NOTE: This sheet is a summary. It may not cover all possible information. If you have questions about this medicine, talk to your doctor, pharmacist, or health care provider.  2015, Elsevier/[employer] Standard. (2008-01-21 17:22:47) Leucovorin injection What is this medicine? LEUCOVORIN (loo koe VOR in) is used to prevent or treat the harmful effects of some medicines. This medicine is used to treat anemia caused by a low amount of  folic acid in the body. It is also used with 5-fluorouracil (5-FU) to treat colon cancer. This medicine may be used for other purposes; ask your health care provider or pharmacist if you have questions. What should I tell my health care provider before I take this medicine? They need to know if you have any of these conditions: -anemia from low levels of vitamin B-12 in the blood -an unusual or allergic reaction to leucovorin, folic acid, other medicines, foods, dyes, or preservatives -pregnant or trying to get pregnant -breast-feeding How should I use this medicine? This medicine is for injection into a muscle or into a vein. It  is given by a health care professional in a hospital or clinic setting. Talk to your pediatrician regarding the use of this medicine in children. Special care may be needed. Overdosage: If you think you have taken too much of this medicine contact a poison control center or emergency room at once. NOTE: This medicine is only for you. Do not share this medicine with others. What if I miss a dose? This does not apply. What may interact with this medicine? -capecitabine -fluorouracil -phenobarbital -phenytoin -primidone -trimethoprim-sulfamethoxazole This list may not describe all possible interactions. Give your health care provider a list of all the medicines, herbs, non-prescription drugs, or dietary supplements you use. Also tell them if you smoke, drink alcohol, or use illegal drugs. Some items may interact with your medicine. What should I watch for while using this medicine? Your condition will be monitored carefully while you are receiving this medicine. This medicine may increase the side effects of 5-fluorouracil, 5-FU. Tell your doctor or health care professional if you have diarrhea or mouth sores that do not get better or that get worse. What side effects may I notice from receiving this medicine? Side effects that you should report to your doctor or  health care professional as soon as possible: -allergic reactions like skin rash, itching or hives, swelling of the face, lips, or tongue -breathing problems -fever, infection -mouth sores -unusual bleeding or bruising -unusually weak or tired Side effects that usually do not require medical attention (report to your doctor or health care professional if they continue or are bothersome): -constipation or diarrhea -loss of appetite -nausea, vomiting This list may not describe all possible side effects. Call your doctor for medical advice about side effects. You may report side effects to FDA at 1-800-FDA-1088. Where should I keep my medicine? This drug is given in a hospital or clinic and will not be stored at home. NOTE: This sheet is a summary. It may not cover all possible information. If you have questions about this medicine, talk to your doctor, pharmacist, or health care provider.  2015, Elsevier/Gold Standard. (2007-12-31 16:50:29) Leucovorin injection What is this medicine? LEUCOVORIN (loo koe VOR in) is used to prevent or treat the harmful effects of some medicines. This medicine is used to treat anemia caused by a low amount of folic acid in the body. It is also used with 5-fluorouracil (5-FU) to treat colon cancer. This medicine may be used for other purposes; ask your health care provider or pharmacist if you have questions. What should I tell my health care provider before I take this medicine? They need to know if you have any of these conditions: -anemia from low levels of vitamin B-12 in the blood -an unusual or allergic reaction to leucovorin, folic acid, other medicines, foods, dyes, or preservatives -pregnant or trying to get pregnant -breast-feeding How should I use this medicine? This medicine is for injection into a muscle or into a vein. It is given by a health care professional in a hospital or clinic setting. Talk to your pediatrician regarding the use of this  medicine in children. Special care may be needed. Overdosage: If you think you have taken too much of this medicine contact a poison control center or emergency room at once. NOTE: This medicine is only for you. Do not share this medicine with others. What if I miss a dose? This does not apply. What may interact with this medicine? -capecitabine -fluorouracil -phenobarbital -phenytoin -primidone -trimethoprim-sulfamethoxazole This list may not describe  all possible interactions. Give your health care provider a list of all the medicines, herbs, non-prescription drugs, or dietary supplements you use. Also tell them if you smoke, drink alcohol, or use illegal drugs. Some items may interact with your medicine. What should I watch for while using this medicine? Your condition will be monitored carefully while you are receiving this medicine. This medicine may increase the side effects of 5-fluorouracil, 5-FU. Tell your doctor or health care professional if you have diarrhea or mouth sores that do not get better or that get worse. What side effects may I notice from receiving this medicine? Side effects that you should report to your doctor or health care professional as soon as possible: -allergic reactions like skin rash, itching or hives, swelling of the face, lips, or tongue -breathing problems -fever, infection -mouth sores -unusual bleeding or bruising -unusually weak or tired Side effects that usually do not require medical attention (report to your doctor or health care professional if they continue or are bothersome): -constipation or diarrhea -loss of appetite -nausea, vomiting This list may not describe all possible side effects. Call your doctor for medical advice about side effects. You may report side effects to FDA at 1-800-FDA-1088. Where should I keep my medicine? This drug is given in a hospital or clinic and will not be stored at home. NOTE: This sheet is a summary. It  may not cover all possible information. If you have questions about this medicine, talk to your doctor, pharmacist, or health care provider.  2015, Elsevier/Gold Standard. (2007-12-31 16:50:29) Leucovorin injection What is this medicine? LEUCOVORIN (loo koe VOR in) is used to prevent or treat the harmful effects of some medicines. This medicine is used to treat anemia caused by a low amount of folic acid in the body. It is also used with 5-fluorouracil (5-FU) to treat colon cancer. This medicine may be used for other purposes; ask your health care provider or pharmacist if you have questions. What should I tell my health care provider before I take this medicine? They need to know if you have any of these conditions: -anemia from low levels of vitamin B-12 in the blood -an unusual or allergic reaction to leucovorin, folic acid, other medicines, foods, dyes, or preservatives -pregnant or trying to get pregnant -breast-feeding How should I use this medicine? This medicine is for injection into a muscle or into a vein. It is given by a health care professional in a hospital or clinic setting. Talk to your pediatrician regarding the use of this medicine in children. Special care may be needed. Overdosage: If you think you have taken too much of this medicine contact a poison control center or emergency room at once. NOTE: This medicine is only for you. Do not share this medicine with others. What if I miss a dose? This does not apply. What may interact with this medicine? -capecitabine -fluorouracil -phenobarbital -phenytoin -primidone -trimethoprim-sulfamethoxazole This list may not describe all possible interactions. Give your health care provider a list of all the medicines, herbs, non-prescription drugs, or dietary supplements you use. Also tell them if you smoke, drink alcohol, or use illegal drugs. Some items may interact with your medicine. What should I watch for while using this  medicine? Your condition will be monitored carefully while you are receiving this medicine. This medicine may increase the side effects of 5-fluorouracil, 5-FU. Tell your doctor or health care professional if you have diarrhea or mouth sores that do not get better or that  get worse. What side effects may I notice from receiving this medicine? Side effects that you should report to your doctor or health care professional as soon as possible: -allergic reactions like skin rash, itching or hives, swelling of the face, lips, or tongue -breathing problems -fever, infection -mouth sores -unusual bleeding or bruising -unusually weak or tired Side effects that usually do not require medical attention (report to your doctor or health care professional if they continue or are bothersome): -constipation or diarrhea -loss of appetite -nausea, vomiting This list may not describe all possible side effects. Call your doctor for medical advice about side effects. You may report side effects to FDA at 1-800-FDA-1088. Where should I keep my medicine? This drug is given in a hospital or clinic and will not be stored at home. NOTE: This sheet is a summary. It may not cover all possible information. If you have questions about this medicine, talk to your doctor, pharmacist, or health care provider.  2015, Elsevier/Gold Standard. (2007-12-31 16:50:29) Fluorouracil, 5-FU injection What is this medicine? FLUOROURACIL, 5-FU (flure oh YOOR a sil) is a chemotherapy drug. It slows the growth of cancer cells. This medicine is used to treat many types of cancer like breast cancer, colon or rectal cancer, pancreatic cancer, and stomach cancer. This medicine may be used for other purposes; ask your health care provider or pharmacist if you have questions. COMMON BRAND NAME(S): Adrucil What should I tell my health care provider before I take this medicine? They need to know if you have any of these conditions: -blood  disorders -dihydropyrimidine dehydrogenase (DPD) deficiency -infection (especially a virus infection such as chickenpox, cold sores, or herpes) -kidney disease -liver disease -malnourished, poor nutrition -recent or ongoing radiation therapy -an unusual or allergic reaction to fluorouracil, other chemotherapy, other medicines, foods, dyes, or preservatives -pregnant or trying to get pregnant -breast-feeding How should I use this medicine? This drug is given as an infusion or injection into a vein. It is administered in a hospital or clinic by a specially trained health care professional. Talk to your pediatrician regarding the use of this medicine in children. Special care may be needed. Overdosage: If you think you have taken too much of this medicine contact a poison control center or emergency room at once. NOTE: This medicine is only for you. Do not share this medicine with others. What if I miss a dose? It is important not to miss your dose. Call your doctor or health care professional if you are unable to keep an appointment. What may interact with this medicine? -allopurinol -cimetidine -dapsone -digoxin -hydroxyurea -leucovorin -levamisole -medicines for seizures like ethotoin, fosphenytoin, phenytoin -medicines to increase blood counts like filgrastim, pegfilgrastim, sargramostim -medicines that treat or prevent blood clots like warfarin, enoxaparin, and dalteparin -methotrexate -metronidazole -pyrimethamine -some other chemotherapy drugs like busulfan, cisplatin, estramustine, vinblastine -trimethoprim -trimetrexate -vaccines Talk to your doctor or health care professional before taking any of these medicines: -acetaminophen -aspirin -ibuprofen -ketoprofen -naproxen This list may not describe all possible interactions. Give your health care provider a list of all the medicines, herbs, non-prescription drugs, or dietary supplements you use. Also tell them if you  smoke, drink alcohol, or use illegal drugs. Some items may interact with your medicine. What should I watch for while using this medicine? Visit your doctor for checks on your progress. This drug may make you feel generally unwell. This is not uncommon, as chemotherapy can affect healthy cells as well as cancer cells. Report any side  effects. Continue your course of treatment even though you feel ill unless your doctor tells you to stop. In some cases, you may be given additional medicines to help with side effects. Follow all directions for their use. Call your doctor or health care professional for advice if you get a fever, chills or sore throat, or other symptoms of a cold or flu. Do not treat yourself. This drug decreases your body's ability to fight infections. Try to avoid being around people who are sick. This medicine may increase your risk to bruise or bleed. Call your doctor or health care professional if you notice any unusual bleeding. Be careful brushing and flossing your teeth or using a toothpick because you may get an infection or bleed more easily. If you have any dental work done, tell your dentist you are receiving this medicine. Avoid taking products that contain aspirin, acetaminophen, ibuprofen, naproxen, or ketoprofen unless instructed by your doctor. These medicines may hide a fever. Do not become pregnant while taking this medicine. Women should inform their doctor if they wish to become pregnant or think they might be pregnant. There is a potential for serious side effects to an unborn child. Talk to your health care professional or pharmacist for more information. Do not breast-feed an infant while taking this medicine. Men should inform their doctor if they wish to father a child. This medicine may lower sperm counts. Do not treat diarrhea with over the counter products. Contact your doctor if you have diarrhea that lasts more than 2 days or if it is severe and watery. This  medicine can make you more sensitive to the sun. Keep out of the sun. If you cannot avoid being in the sun, wear protective clothing and use sunscreen. Do not use sun lamps or tanning beds/booths. What side effects may I notice from receiving this medicine? Side effects that you should report to your doctor or health care professional as soon as possible: -allergic reactions like skin rash, itching or hives, swelling of the face, lips, or tongue -low blood counts - this medicine may decrease the number of white blood cells, red blood cells and platelets. You may be at increased risk for infections and bleeding. -signs of infection - fever or chills, cough, sore throat, pain or difficulty passing urine -signs of decreased platelets or bleeding - bruising, pinpoint red spots on the skin, black, tarry stools, blood in the urine -signs of decreased red blood cells - unusually weak or tired, fainting spells, lightheadedness -breathing problems -changes in vision -chest pain -mouth sores -nausea and vomiting -pain, swelling, redness at site where injected -pain, tingling, numbness in the hands or feet -redness, swelling, or sores on hands or feet -stomach pain -unusual bleeding Side effects that usually do not require medical attention (report to your doctor or health care professional if they continue or are bothersome): -changes in finger or toe nails -diarrhea -dry or itchy skin -hair loss -headache -loss of appetite -sensitivity of eyes to the light -stomach upset -unusually teary eyes This list may not describe all possible side effects. Call your doctor for medical advice about side effects. You may report side effects to FDA at 1-800-FDA-1088. Where should I keep my medicine? This drug is given in a hospital or clinic and will not be stored at home. NOTE: This sheet is a summary. It may not cover all possible information. If you have questions about this medicine, talk to your doctor,  pharmacist, or health care provider.  2015, [employer]. (2007-10-30 13:53:16) Prochlorperazine tablets What is this medicine? PROCHLORPERAZINE (proe klor PER a zeen) helps to control severe nausea and vomiting. This medicine is also used to treat schizophrenia. It can also help patients who experience anxiety that is not due to psychological illness. This medicine may be used for other purposes; ask your health care provider or pharmacist if you have questions. COMMON BRAND NAME(S): Compazine What should I tell my health care provider before I take this medicine? They need to know if you have any of these conditions: -blood disorders or disease -dementia -liver disease or jaundice -Parkinson's disease -uncontrollable movement disorder -an unusual or allergic reaction to prochlorperazine, other medicines, foods, dyes, or preservatives -pregnant or trying to get pregnant -breast-feeding How should I use this medicine? Take this medicine by mouth with a glass of water. Follow the directions on the prescription label. Take your doses at regular intervals. Do not take your medicine more often than directed. Do not stop taking this medicine suddenly. This can cause nausea, vomiting, and dizziness. Ask your doctor or health care professional for advice. Talk to your pediatrician regarding the use of this medicine in children. Special care may be needed. While this drug may be prescribed for children as young as 2 years for selected conditions, precautions do apply. Overdosage: If you think you have taken too much of this medicine contact a poison control center or emergency room at once. NOTE: This medicine is only for you. Do not share this medicine with others. What if I miss a dose? If you miss a dose, take it as soon as you can. If it is almost time for your next dose, take only that dose. Do not take double or extra doses. What may interact with this medicine? Do not take this  medicine with any of the following medications: -amoxapine -antidepressants like citalopram, escitalopram, fluoxetine, paroxetine, and sertraline -deferoxamine -dofetilide -maprotiline -tricyclic antidepressants like amitriptyline, clomipramine, imipramine, nortiptyline and others This medicine may also interact with the following medications: -lithium -medicines for pain -phenytoin -propranolol -warfarin This list may not describe all possible interactions. Give your health care provider a list of all the medicines, herbs, non-prescription drugs, or dietary supplements you use. Also tell them if you smoke, drink alcohol, or use illegal drugs. Some items may interact with your medicine. What should I watch for while using this medicine? Visit your doctor or health care professional for regular checks on your progress. You may get drowsy or dizzy. Do not drive, use machinery, or do anything that needs mental alertness until you know how this medicine affects you. Do not stand or sit up quickly, especially if you are an older patient. This reduces the risk of dizzy or fainting spells. Alcohol may interfere with the effect of this medicine. Avoid alcoholic drinks. This medicine can reduce the response of your body to heat or cold. Dress warm in cold weather and stay hydrated in hot weather. If possible, avoid extreme temperatures like saunas, hot tubs, very hot or cold showers, or activities that can cause dehydration such as vigorous exercise. This medicine can make you more sensitive to the sun. Keep out of the sun. If you cannot avoid being in the sun, wear protective clothing and use sunscreen. Do not use sun lamps or tanning beds/booths. Your mouth may get dry. Chewing sugarless gum or sucking hard candy, and drinking plenty of water may help. Contact your doctor if the problem does not go away or is severe.  What side effects may I notice from receiving this medicine? Side effects that you  should report to your doctor or health care professional as soon as possible: -blurred vision -breast enlargement in men or women -breast milk in women who are not breast-feeding -chest pain, fast or irregular heartbeat -confusion, restlessness -dark yellow or brown urine -difficulty breathing or swallowing -dizziness or fainting spells -drooling, shaking, movement difficulty (shuffling walk) or rigidity -fever, chills, sore throat -involuntary or uncontrollable movements of the eyes, mouth, head, arms, and legs -seizures -stomach area pain -unusually weak or tired -unusual bleeding or bruising -yellowing of skin or eyes Side effects that usually do not require medical attention (report to your doctor or health care professional if they continue or are bothersome): -difficulty passing urine -difficulty sleeping -headache -sexual dysfunction -skin rash, or itching This list may not describe all possible side effects. Call your doctor for medical advice about side effects. You may report side effects to FDA at 1-800-FDA-1088. Where should I keep my medicine? Keep out of the reach of children. Store at room temperature between 15 and 30 degrees C (59 and 86 degrees F). Protect from light. Throw away any unused medicine after the expiration date. NOTE: This sheet is a summary. It may not cover all possible information. If you have questions about this medicine, talk to your doctor, pharmacist, or health care provider.  2015, [employer]. (2011-11-14 16:59:39) Lidocaine; Prilocaine cream What is this medicine? LIDOCAINE; PRILOCAINE (LYE doe kane; PRIL oh kane) is a topical anesthetic that causes loss of feeling in the skin and surrounding tissues. It is used to numb the skin before procedures or injections. This medicine may be used for other purposes; ask your health care provider or pharmacist if you have questions. COMMON BRAND NAME(S): EMLA What should I tell my health  care provider before I take this medicine? They need to know if you have any of these conditions: -glucose-6-phosphate deficiencies -heart disease -kidney or liver disease -methemoglobinemia -an unusual or allergic reaction to lidocaine, prilocaine, other medicines, foods, dyes, or preservatives -pregnant or trying to get pregnant -breast-feeding How should I use this medicine? This medicine is for external use only on the skin. Do not take by mouth. Follow the directions on the prescription label. Wash hands before and after use. Do not use more or leave in contact with the skin longer than directed. Do not apply to eyes or open wounds. It can cause irritation and blurred or temporary loss of vision. If this medicine comes in contact with your eyes, immediately rinse the eye with water. Do not touch or rub the eye. Contact your health care provider right away. Talk to your pediatrician regarding the use of this medicine in children. While this medicine may be prescribed for children for selected conditions, precautions do apply. Overdosage: If you think you have taken too much of this medicine contact a poison control center or emergency room at once. NOTE: This medicine is only for you. Do not share this medicine with others. What if I miss a dose? This medicine is usually only applied once prior to each procedure. It must be in contact with the skin for a period of time for it to work. If you applied this medicine later than directed, tell your health care professional before starting the procedure. What may interact with this medicine? -acetaminophen -chloroquine -dapsone -medicines to control heart rhythm -nitrates like nitroglycerin and nitroprusside -other ointments, creams, or sprays that may contain anesthetic medicine -  phenobarbital -phenytoin -quinine -sulfonamides like sulfacetamide, sulfamethoxazole, sulfasalazine and others This list may not describe all possible  interactions. Give your health care provider a list of all the medicines, herbs, non-prescription drugs, or dietary supplements you use. Also tell them if you smoke, drink alcohol, or use illegal drugs. Some items may interact with your medicine. What should I watch for while using this medicine? Be careful to avoid injury to the treated area while it is numb and you are not aware of pain. Avoid scratching, rubbing, or exposing the treated area to hot or cold temperatures until complete sensation has returned. The numb feeling will wear off a few hours after applying the cream. What side effects may I notice from receiving this medicine? Side effects that you should report to your doctor or health care professional as soon as possible: -blurred vision -chest pain -difficulty breathing -dizziness -drowsiness -fast or irregular heartbeat -skin rash or itching -swelling of your throat, lips, or face -trembling Side effects that usually do not require medical attention (report to your doctor or health care professional if they continue or are bothersome): -changes in ability to feel hot or cold -redness and swelling at the application site This list may not describe all possible side effects. Call your doctor for medical advice about side effects. You may report side effects to FDA at 1-800-FDA-1088. Where should I keep my medicine? Keep out of reach of children. Store at room temperature between 15 and 30 degrees C (59 and 86 degrees F). Keep container tightly closed. Throw away any unused medicine after the expiration date. NOTE: This sheet is a summary. It may not cover all possible information. If you have questions about this medicine, talk to your doctor, pharmacist, or health care provider.  2015, [employer]. (2007-12-30 17:14:35) Dexamethasone injection What is this medicine? DEXAMETHASONE (dex a METH a sone) is a corticosteroid. It is used to treat inflammation of the skin,  joints, lungs, and other organs. Common conditions treated include asthma, allergies, and arthritis. It is also used for other conditions, like blood disorders and diseases of the adrenal glands. This medicine may be used for other purposes; ask your health care provider or pharmacist if you have questions. COMMON BRAND NAME(S): Decadron, Solurex What should I tell my health care provider before I take this medicine? They need to know if you have any of these conditions: -blood clotting problems -Cushing's syndrome -diabetes -glaucoma -heart problems or disease -high blood pressure -infection like herpes, measles, tuberculosis, or chickenpox -kidney disease -liver disease -mental problems -myasthenia gravis -osteoporosis -previous heart attack -seizures -stomach, ulcer or intestine disease including colitis and diverticulitis -thyroid problem -an unusual or allergic reaction to dexamethasone, corticosteroids, other medicines, lactose, foods, dyes, or preservatives -pregnant or trying to get pregnant -breast-feeding How should I use this medicine? This medicine is for injection into a muscle, joint, lesion, soft tissue, or vein. It is given by a health care professional in a hospital or clinic setting. Talk to your pediatrician regarding the use of this medicine in children. Special care may be needed. Overdosage: If you think you have taken too much of this medicine contact a poison control center or emergency room at once. NOTE: This medicine is only for you. Do not share this medicine with others. What if I miss a dose? This may not apply. If you are having a series of injections over a prolonged period, try not to miss an appointment. Call your doctor or health care professional to  reschedule if you are unable to keep an appointment. What may interact with this medicine? Do not take this medicine with any of the following medications: -mifepristone, RU-486 -vaccines This  medicine may also interact with the following medications: -amphotericin B -antibiotics like clarithromycin, erythromycin, and troleandomycin -aspirin and aspirin-like drugs -barbiturates like phenobarbital -carbamazepine -cholestyramine -cholinesterase inhibitors like donepezil, galantamine, rivastigmine, and tacrine -cyclosporine -digoxin -diuretics -ephedrine -female hormones, like estrogens or progestins and birth control pills -indinavir -isoniazid -ketoconazole -medicines for diabetes -medicines that improve muscle tone or strength for conditions like myasthenia gravis -NSAIDs, medicines for pain and inflammation, like ibuprofen or naproxen -phenytoin -rifampin -thalidomide -warfarin This list may not describe all possible interactions. Give your health care provider a list of all the medicines, herbs, non-prescription drugs, or dietary supplements you use. Also tell them if you smoke, drink alcohol, or use illegal drugs. Some items may interact with your medicine. What should I watch for while using this medicine? Your condition will be monitored carefully while you are receiving this medicine. If you are taking this medicine for a long time, carry an identification card with your name and address, the type and dose of your medicine, and your doctor's name and address. This medicine may increase your risk of getting an infection. Stay away from people who are sick. Tell your doctor or health care professional if you are around anyone with measles or chickenpox. Talk to your health care provider before you get any vaccines that you take this medicine. If you are going to have surgery, tell your doctor or health care professional that you have taken this medicine within the last twelve months. Ask your doctor or health care professional about your diet. You may need to lower the amount of salt you eat. The medicine can increase your blood sugar. If you are a diabetic check with  your doctor if you need help adjusting the dose of your diabetic medicine. What side effects may I notice from receiving this medicine? Side effects that you should report to your doctor or health care professional as soon as possible: -allergic reactions like skin rash, itching or hives, swelling of the face, lips, or tongue -black or tarry stools -change in the amount of urine -changes in vision -confusion, excitement, restlessness, a false sense of well-being -fever, sore throat, sneezing, cough, or other signs of infection, wounds that will not heal -hallucinations -increased thirst -mental depression, mood swings, mistaken feelings of self importance or of being mistreated -pain in hips, back, ribs, arms, shoulders, or legs -pain, redness, or irritation at the injection site -redness, blistering, peeling or loosening of the skin, including inside the mouth -rounding out of face -swelling of feet or lower legs -unusual bleeding or bruising -unusual tired or weak -wounds that do not heal Side effects that usually do not require medical attention (report to your doctor or health care professional if they continue or are bothersome): -diarrhea or constipation -change in taste -headache -nausea, vomiting -skin problems, acne, thin and shiny skin -touble sleeping -unusual growth of hair on the face or body -weight gain This list may not describe all possible side effects. Call your doctor for medical advice about side effects. You may report side effects to FDA at 1-800-FDA-1088. Where should I keep my medicine? This drug is given in a hospital or clinic and will not be stored at home. NOTE: This sheet is a summary. It may not cover all possible information. If you have questions about this medicine, talk  to your doctor, pharmacist, or health care provider.  2015, [employer]. (2007-10-17 14:04:12) Ondansetron injection What is this medicine? ONDANSETRON (on DAN se  tron) is used to treat nausea and vomiting caused by chemotherapy. It is also used to prevent or treat nausea and vomiting after surgery. This medicine may be used for other purposes; ask your health care provider or pharmacist if you have questions. COMMON BRAND NAME(S): Zofran What should I tell my health care provider before I take this medicine? They need to know if you have any of these conditions: -heart disease -history of irregular heartbeat -liver disease -low levels of magnesium or potassium in the blood -an unusual or allergic reaction to ondansetron, granisetron, other medicines, foods, dyes, or preservatives -pregnant or trying to get pregnant -breast-feeding How should I use this medicine? This medicine is for infusion into a vein. It is given by a health care professional in a hospital or clinic setting. Talk to your pediatrician regarding the use of this medicine in children. Special care may be needed. Overdosage: If you think you have taken too much of this medicine contact a poison control center or emergency room at once. NOTE: This medicine is only for you. Do not share this medicine with others. What if I miss a dose? This does not apply. What may interact with this medicine? Do not take this medicine with any of the following medications: -apomorphine -certain medicines for fungal infections like fluconazole, itraconazole, ketoconazole, posaconazole, voriconazole -cisapride -dofetilide -dronedarone -pimozide -thioridazine -ziprasidone This medicine may also interact with the following medications: -carbamazepine -certain medicines for depression, anxiety, or psychotic disturbances -fentanyl -linezolid -MAOIs like Carbex, Eldepryl, Marplan, Nardil, and Parnate -methylene blue (injected into a vein) -other medicines that prolong the QT interval (cause an abnormal heart rhythm) -phenytoin -rifampicin -tramadol This list may not describe all possible  interactions. Give your health care provider a list of all the medicines, herbs, non-prescription drugs, or dietary supplements you use. Also tell them if you smoke, drink alcohol, or use illegal drugs. Some items may interact with your medicine. What should I watch for while using this medicine? Your condition will be monitored carefully while you are receiving this medicine. What side effects may I notice from receiving this medicine? Side effects that you should report to your doctor or health care professional as soon as possible: -allergic reactions like skin rash, itching or hives, swelling of the face, lips, or tongue -breathing problems -confusion -dizziness -fast or irregular heartbeat -feeling faint or lightheaded, falls -fever and chills -loss of balance or coordination -seizures -sweating -swelling of the hands and feet -tightness in the chest -tremors -unusually weak or tired Side effects that usually do not require medical attention (report to your doctor or health care professional if they continue or are bothersome): -constipation or diarrhea -headache This list may not describe all possible side effects. Call your doctor for medical advice about side effects. You may report side effects to FDA at 1-800-FDA-1088. Where should I keep my medicine? This drug is given in a hospital or clinic and will not be stored at home. NOTE: This sheet is a summary. It may not cover all possible information. If you have questions about this medicine, talk to your doctor, pharmacist, or health care provider.  2015, [employer]. (2013-04-02 16:18:28)

## 2014-01-29 ENCOUNTER — Other Ambulatory Visit (HOSPITAL_COMMUNITY): Payer: Self-pay | Admitting: Oncology

## 2014-01-29 ENCOUNTER — Encounter (HOSPITAL_COMMUNITY): Payer: Self-pay | Admitting: Pharmacy Technician

## 2014-01-30 ENCOUNTER — Encounter (HOSPITAL_COMMUNITY): Payer: Medicare Other

## 2014-01-30 ENCOUNTER — Encounter (HOSPITAL_COMMUNITY)
Admission: RE | Admit: 2014-01-30 | Discharge: 2014-01-30 | Disposition: A | Discharge status: Home / Self Care | Payer: Medicare Other | Source: Ambulatory Visit | Attending: General Surgery | Admitting: General Surgery

## 2014-01-30 ENCOUNTER — Encounter (HOSPITAL_COMMUNITY): Payer: Self-pay

## 2014-01-30 DIAGNOSIS — Z01818 Encounter for other preprocedural examination: Secondary | ICD-10-CM | POA: Diagnosis not present

## 2014-01-30 NOTE — H&P (Signed)
  NTS SOAP Note  Vital Signs:  Vitals as of: 1/74/9449: Systolic 675: Diastolic 74: Heart Rate 74: Temp 96.29F: Height 73ft 4in: Weight 157Lbs 0 Ounces: BMI 26.95  BMI : 26.95 kg/m2  Subjective: This 68 Years old Female presents for of a colon cancer.  Found on routine TCS.  Biopsy positive for colon cancer at proximal transverse colon.  No family h/o colon cancer.  Review of Symptoms:  Constitutional:unremarkable   Head:unremarkable    Eyes:unremarkable   Nose/Mouth/Throat:unremarkable Cardiovascular:  unremarkable   Respiratory:unremarkable   Gastrointestinal:  unremarkable   Genitourinary:unremarkable     Musculoskeletal:unremarkable   Skin:unremarkable Hematolgic/Lymphatic:unremarkable       hay fever   Past Medical History:    Reviewed  Past Medical History  Surgical History: neck surgery, hand surgery Medical Problems: none Allergies: sulfur Medications: none   Social History:Reviewed  Social History  Preferred Language: English Race:  White Ethnicity: Not Hispanic / Latino Age: 82 Years 7 Months Marital Status:  M   Smoking Status: Never smoker reviewed on 12/23/2013 Functional Status reviewed on 12/23/2013 ------------------------------------------------ Bathing: Normal Cooking: Normal Dressing: Normal Driving: Normal Eating: Normal Managing Meds: Normal Oral Care: Normal Shopping: Normal Toileting: Normal Transferring: Normal Walking: Normal Cognitive Status reviewed on 12/23/2013 ------------------------------------------------ Attention: Normal Decision Making: Normal Language: Normal Memory: Normal Motor: Normal Perception: Normal Problem Solving: Normal Visual and Spatial: Normal   Family History:  Reviewed  Family Health History Mother, Deceased; Diabetes mellitus, unspecified type;  Father, Deceased; Chronic obstructive lung disease (COPD);     Objective Information: General:   Well appearing, well nourished in no distress. Heart:  RRR, no murmur or gallop.  Normal S1, S2.  No S3, S4.  Lungs:    CTA bilaterally, no wheezes, rhonchi, rales.  Breathing unlabored. Abdomen:Soft, NT/ND, normal bowel sounds, no HSM, no masses.  No peritoneal signs.  Assessment:Colon cancer  Diagnoses: 153.9 Malignant tumor of colon (Malignant neoplasm of colon, unspecified    Plan:  Scheduled for a Port-A-Cath insertion on 02/04/2014   Patient Education:Alternative treatments to surgery were discussed with patient (and family).  Risks and benefits  of procedure including bleeding, infection, and pneumothorax were fully explained to the patientwere fully explained to the patient (and family) who gave informed consent. Patient/family questions were addressed.  Follow-up:Pending Surgery

## 2014-02-02 ENCOUNTER — Encounter (HOSPITAL_COMMUNITY)
Admission: RE | Admit: 2014-02-02 | Discharge: 2014-02-02 | Disposition: A | Discharge status: Home / Self Care | Payer: Medicare Other | Source: Ambulatory Visit | Attending: Oncology | Admitting: Oncology

## 2014-02-02 ENCOUNTER — Encounter (HOSPITAL_BASED_OUTPATIENT_CLINIC_OR_DEPARTMENT_OTHER): Payer: Medicare Other

## 2014-02-02 ENCOUNTER — Encounter (HOSPITAL_COMMUNITY): Payer: Self-pay

## 2014-02-02 DIAGNOSIS — C189 Malignant neoplasm of colon, unspecified: Secondary | ICD-10-CM

## 2014-02-02 DIAGNOSIS — M76899 Other specified enthesopathies of unspecified lower limb, excluding foot: Secondary | ICD-10-CM | POA: Diagnosis not present

## 2014-02-02 DIAGNOSIS — M549 Dorsalgia, unspecified: Secondary | ICD-10-CM | POA: Insufficient documentation

## 2014-02-02 DIAGNOSIS — M25539 Pain in unspecified wrist: Secondary | ICD-10-CM | POA: Insufficient documentation

## 2014-02-02 MED ORDER — TECHNETIUM TC 99M MEDRONATE IV KIT
25.0000 | PACK | Freq: Once | INTRAVENOUS | Status: AC | PRN
  Administered 2014-02-02: 25 via INTRAVENOUS

## 2014-02-02 NOTE — Progress Notes (Signed)
Chemo teaching done for Oxaliplatin, 5FU, Leucovorin. Med calendar given. Distress screening done.

## 2014-02-04 ENCOUNTER — Encounter (HOSPITAL_COMMUNITY): Payer: Self-pay | Admitting: *Deleted

## 2014-02-04 ENCOUNTER — Ambulatory Visit (HOSPITAL_COMMUNITY): Payer: Medicare Other | Admitting: Anesthesiology

## 2014-02-04 ENCOUNTER — Encounter (HOSPITAL_COMMUNITY): Payer: Medicare Other | Admitting: Anesthesiology

## 2014-02-04 ENCOUNTER — Ambulatory Visit (HOSPITAL_COMMUNITY)
Admission: RE | Admit: 2014-02-04 | Discharge: 2014-02-04 | Disposition: A | Discharge status: Home / Self Care | Payer: Medicare Other | Source: Ambulatory Visit | Attending: General Surgery | Admitting: General Surgery

## 2014-02-04 ENCOUNTER — Ambulatory Visit (HOSPITAL_COMMUNITY): Payer: Medicare Other

## 2014-02-04 ENCOUNTER — Encounter (HOSPITAL_COMMUNITY)
Admission: RE | Disposition: A | Discharge status: Home / Self Care | Payer: Self-pay | Source: Ambulatory Visit | Attending: General Surgery

## 2014-02-04 DIAGNOSIS — Z87891 Personal history of nicotine dependence: Secondary | ICD-10-CM | POA: Insufficient documentation

## 2014-02-04 DIAGNOSIS — R918 Other nonspecific abnormal finding of lung field: Secondary | ICD-10-CM | POA: Diagnosis not present

## 2014-02-04 DIAGNOSIS — C189 Malignant neoplasm of colon, unspecified: Secondary | ICD-10-CM | POA: Insufficient documentation

## 2014-02-04 DIAGNOSIS — Z882 Allergy status to sulfonamides status: Secondary | ICD-10-CM | POA: Diagnosis not present

## 2014-02-04 DIAGNOSIS — Z452 Encounter for adjustment and management of vascular access device: Secondary | ICD-10-CM | POA: Diagnosis not present

## 2014-02-04 HISTORY — PX: PORTACATH PLACEMENT: SHX2246

## 2014-02-04 SURGERY — INSERTION, TUNNELED CENTRAL VENOUS DEVICE, WITH PORT
Anesthesia: Monitor Anesthesia Care | Site: Chest | Laterality: Right

## 2014-02-04 MED ORDER — ONDANSETRON HCL 4 MG/2ML IJ SOLN
INTRAMUSCULAR | Status: AC
  Filled 2014-02-04: qty 2

## 2014-02-04 MED ORDER — FENTANYL CITRATE 0.05 MG/ML IJ SOLN
25.0000 ug | INTRAMUSCULAR | Status: AC
  Administered 2014-02-04 (×2): 25 ug via INTRAVENOUS

## 2014-02-04 MED ORDER — PROPOFOL 10 MG/ML IV BOLUS
INTRAVENOUS | Status: DC | PRN
  Administered 2014-02-04: 6.9 mg via INTRAVENOUS

## 2014-02-04 MED ORDER — LIDOCAINE HCL (CARDIAC) 10 MG/ML IV SOLN
INTRAVENOUS | Status: DC | PRN
  Administered 2014-02-04: 25 mg via INTRAVENOUS

## 2014-02-04 MED ORDER — MIDAZOLAM HCL 2 MG/2ML IJ SOLN
1.0000 mg | INTRAMUSCULAR | Status: DC | PRN
  Administered 2014-02-04: 2 mg via INTRAVENOUS

## 2014-02-04 MED ORDER — PROPOFOL 10 MG/ML IV EMUL
INTRAVENOUS | Status: AC
  Filled 2014-02-04: qty 20

## 2014-02-04 MED ORDER — KETOROLAC TROMETHAMINE 30 MG/ML IJ SOLN
30.0000 mg | Freq: Once | INTRAMUSCULAR | Status: AC
Start: 2014-02-04 — End: 2014-02-04
  Administered 2014-02-04: 30 mg via INTRAVENOUS

## 2014-02-04 MED ORDER — LIDOCAINE HCL (PF) 1 % IJ SOLN
INTRAMUSCULAR | Status: DC | PRN
  Administered 2014-02-04: 9 mL

## 2014-02-04 MED ORDER — ONDANSETRON HCL 4 MG/2ML IJ SOLN
4.0000 mg | Freq: Once | INTRAMUSCULAR | Status: AC
  Administered 2014-02-04: 4 mg via INTRAVENOUS

## 2014-02-04 MED ORDER — PROMETHAZINE HCL 25 MG/ML IJ SOLN
INTRAMUSCULAR | Status: AC
  Filled 2014-02-04: qty 1

## 2014-02-04 MED ORDER — FENTANYL CITRATE 0.05 MG/ML IJ SOLN
INTRAMUSCULAR | Status: AC
  Filled 2014-02-04: qty 2

## 2014-02-04 MED ORDER — FENTANYL CITRATE 0.05 MG/ML IJ SOLN
25.0000 ug | INTRAMUSCULAR | Status: DC | PRN
  Administered 2014-02-04: 50 ug via INTRAVENOUS
  Filled 2014-02-04: qty 2

## 2014-02-04 MED ORDER — CHLORHEXIDINE GLUCONATE 4 % EX LIQD: 1.0000 "application " | Freq: Once | CUTANEOUS | Status: DC

## 2014-02-04 MED ORDER — HEPARIN SOD (PORK) LOCK FLUSH 100 UNIT/ML IV SOLN
INTRAVENOUS | Status: AC
  Filled 2014-02-04: qty 5

## 2014-02-04 MED ORDER — LACTATED RINGERS IV SOLN
INTRAVENOUS | Status: DC
  Administered 2014-02-04: 11:00:00 via INTRAVENOUS

## 2014-02-04 MED ORDER — CEFAZOLIN SODIUM-DEXTROSE 2-3 GM-% IV SOLR
2.0000 g | INTRAVENOUS | Status: AC
  Administered 2014-02-04: 2 g via INTRAVENOUS

## 2014-02-04 MED ORDER — PROPOFOL INFUSION 10 MG/ML OPTIME
INTRAVENOUS | Status: DC | PRN
  Administered 2014-02-04: 125 ug/kg/min via INTRAVENOUS

## 2014-02-04 MED ORDER — DEXAMETHASONE SODIUM PHOSPHATE 4 MG/ML IJ SOLN
4.0000 mg | Freq: Once | INTRAMUSCULAR | Status: AC
  Administered 2014-02-04: 4 mg via INTRAVENOUS

## 2014-02-04 MED ORDER — PROMETHAZINE HCL 25 MG/ML IJ SOLN
6.2500 mg | INTRAMUSCULAR | Status: DC | PRN
  Administered 2014-02-04: 6.25 mg via INTRAVENOUS

## 2014-02-04 MED ORDER — HYDROCODONE-ACETAMINOPHEN 5-325 MG PO TABS: 1.0000 | ORAL_TABLET | ORAL | Status: AC | PRN

## 2014-02-04 MED ORDER — FENTANYL CITRATE 0.05 MG/ML IJ SOLN
INTRAMUSCULAR | Status: DC | PRN
  Administered 2014-02-04 (×4): 25 ug via INTRAVENOUS

## 2014-02-04 MED ORDER — ONDANSETRON HCL 4 MG/2ML IJ SOLN
4.0000 mg | Freq: Once | INTRAMUSCULAR | Status: AC | PRN
  Administered 2014-02-04: 4 mg via INTRAVENOUS

## 2014-02-04 MED ORDER — LIDOCAINE HCL (PF) 1 % IJ SOLN
INTRAMUSCULAR | Status: AC
  Filled 2014-02-04: qty 30

## 2014-02-04 MED ORDER — DEXAMETHASONE SODIUM PHOSPHATE 4 MG/ML IJ SOLN
INTRAMUSCULAR | Status: AC
  Filled 2014-02-04: qty 1

## 2014-02-04 MED ORDER — MIDAZOLAM HCL 2 MG/2ML IJ SOLN
INTRAMUSCULAR | Status: AC
  Filled 2014-02-04: qty 2

## 2014-02-04 MED ORDER — SODIUM CHLORIDE 0.9 % IJ SOLN
INTRAMUSCULAR | Status: AC
  Filled 2014-02-04: qty 10

## 2014-02-04 MED ORDER — CEFAZOLIN SODIUM-DEXTROSE 2-3 GM-% IV SOLR
INTRAVENOUS | Status: AC
  Filled 2014-02-04: qty 50

## 2014-02-04 MED ORDER — LIDOCAINE HCL (PF) 1 % IJ SOLN
INTRAMUSCULAR | Status: AC
  Filled 2014-02-04: qty 5

## 2014-02-04 MED ORDER — SODIUM CHLORIDE 0.9 % IV SOLN
INTRAVENOUS | Status: DC | PRN
  Administered 2014-02-04: 500 mL

## 2014-02-04 MED ORDER — PROMETHAZINE HCL 25 MG/ML IJ SOLN: 12.5000 mg | Freq: Once | INTRAMUSCULAR | Status: DC | PRN

## 2014-02-04 MED ORDER — HEPARIN SOD (PORK) LOCK FLUSH 100 UNIT/ML IV SOLN
INTRAVENOUS | Status: DC | PRN
  Administered 2014-02-04: 500 [IU] via INTRAVENOUS

## 2014-02-04 MED ORDER — KETOROLAC TROMETHAMINE 30 MG/ML IJ SOLN
INTRAMUSCULAR | Status: AC
  Filled 2014-02-04: qty 1

## 2014-02-04 SURGICAL SUPPLY — 30 items
BAG DECANTER FOR FLEXI CONT (MISCELLANEOUS) ×3 IMPLANT
BAG HAMPER (MISCELLANEOUS) ×3 IMPLANT
CLOTH BEACON ORANGE TIMEOUT ST (SAFETY) ×3 IMPLANT
COVER LIGHT HANDLE STERIS (MISCELLANEOUS) ×6 IMPLANT
DECANTER SPIKE VIAL GLASS SM (MISCELLANEOUS) ×3 IMPLANT
DRAPE C-ARM FOLDED MOBILE STRL (DRAPES) ×3 IMPLANT
DURAPREP 6ML APPLICATOR 50/CS (WOUND CARE) ×3 IMPLANT
ELECT REM PT RETURN 9FT ADLT (ELECTROSURGICAL) ×3
ELECTRODE REM PT RTRN 9FT ADLT (ELECTROSURGICAL) ×1 IMPLANT
GLOVE BIOGEL PI IND STRL 7.0 (GLOVE) ×1 IMPLANT
GLOVE BIOGEL PI INDICATOR 7.0 (GLOVE) ×2
GLOVE SS BIOGEL STRL SZ 6.5 (GLOVE) ×1 IMPLANT
GLOVE SUPERSENSE BIOGEL SZ 6.5 (GLOVE) ×2
GLOVE SURG SS PI 7.5 STRL IVOR (GLOVE) ×6 IMPLANT
GOWN STRL REUS W/TWL LRG LVL3 (GOWN DISPOSABLE) ×6 IMPLANT
IV NS 500ML (IV SOLUTION) ×2
IV NS 500ML BAXH (IV SOLUTION) ×1 IMPLANT
KIT PORT POWER 8FR ISP MRI (CATHETERS) ×3 IMPLANT
KIT ROOM TURNOVER APOR (KITS) ×3 IMPLANT
MANIFOLD NEPTUNE II (INSTRUMENTS) ×3 IMPLANT
NEEDLE HYPO 25X1 1.5 SAFETY (NEEDLE) ×3 IMPLANT
PACK MINOR (CUSTOM PROCEDURE TRAY) ×3 IMPLANT
PAD ARMBOARD 7.5X6 YLW CONV (MISCELLANEOUS) ×3 IMPLANT
SET BASIN LINEN APH (SET/KITS/TRAYS/PACK) ×3 IMPLANT
SUT VIC AB 3-0 SH 27 (SUTURE) ×2
SUT VIC AB 3-0 SH 27X BRD (SUTURE) ×1 IMPLANT
SUT VIC AB 4-0 PS2 27 (SUTURE) ×3 IMPLANT
SYR 20CC LL (SYRINGE) ×3 IMPLANT
SYRINGE 12CC LL (MISCELLANEOUS) ×3 IMPLANT
SYRINGE CONTROL L 12CC (SYRINGE) ×3 IMPLANT

## 2014-02-04 NOTE — Progress Notes (Signed)
CXR done

## 2014-02-04 NOTE — Op Note (Signed)
Patient:  Amanda Norris  DOB:  1945/08/18  MRN:  [id number]   Preop Diagnosis:  Colon carcinoma, need for central venous access  Postop Diagnosis:  Same  Procedure:  Port-A-Cath insertion  Surgeon:  Aviva Signs, M.D.  Anes:  MAC  Indications:  Patient is a 68 year old white female recently diagnosed with colon carcinoma who is about to undergo chemotherapy. The risks and benefits of the procedure including bleeding, infection, and pneumothorax were fully explained to the patient, who gave informed consent.  Procedure note:  The patient was placed in Trendelenburg position after the right upper chest was prepped and draped using usual sterile technique with DuraPrep. Surgical site confirmation was performed.  1% Xylocaine was used for local anesthesia. An incision was made below the right clavicle. The subcutaneous pocket was then formed. A needle is advanced into the right subclavian vein without difficulty. A guidewire was then advanced into the right atrium under digital fluoroscopy. An introducer and peel-away sheath were placed over the guidewire. The catheter was then inserted through the peel-away sheath the peel-away sheath was removed. The catheter was then attached to the port and the port placed in subcutaneous pocket. A power port was inserted. Good backflow was noted in the port. The port was flushed with heparin flush. The subcutaneous layer was reapproximated using 3-0 Vicryl interrupted suture. The skin was closed using a 4 Vicryl subcuticular suture. Liquid band was then applied.  All tape and needle counts were correct the end of the procedure. Patient was transferred to PACU in stable condition. A chest x-ray will be performed at that time.  Complications:  None  EBL:  Minimal  Specimen:  None

## 2014-02-04 NOTE — Anesthesia Preprocedure Evaluation (Signed)
Anesthesia Evaluation  Patient identified by MRN, date of birth, ID band Patient awake    Reviewed: Allergy & Precautions, H&P , NPO status , Patient's Chart, lab work & pertinent test results  History of Anesthesia Complications (+) PONV and history of anesthetic complications  Airway Mallampati: I TM Distance: >3 FB Neck ROM: Full    Dental  (+) Teeth Intact   Pulmonary former smoker,  breath sounds clear to auscultation        Cardiovascular negative cardio ROS  Rhythm:Regular Rate:Normal     Neuro/Psych    GI/Hepatic negative GI ROS,   Endo/Other    Renal/GU      Musculoskeletal Chronic LBP    Abdominal   Peds  Hematology   Anesthesia Other Findings   Reproductive/Obstetrics                           Anesthesia Physical Anesthesia Plan  ASA: II  Anesthesia Plan: MAC   Post-op Pain Management:    Induction: Intravenous  Airway Management Planned: Simple Face Mask  Additional Equipment:   Intra-op Plan:   Post-operative Plan:   Informed Consent: I have reviewed the patients History and Physical, chart, labs and discussed the procedure including the risks, benefits and alternatives for the proposed anesthesia with the patient or authorized representative who has indicated his/her understanding and acceptance.     Plan Discussed with:   Anesthesia Plan Comments:         Anesthesia Quick Evaluation

## 2014-02-04 NOTE — Interval H&P Note (Signed)
History and Physical Interval Note:  02/04/2014 10:55 AM  Amanda Norris  has presented today for surgery, with the diagnosis of colon cancer  The various methods of treatment have been discussed with the patient and family. After consideration of risks, benefits and other options for treatment, the patient has consented to  Procedure(s): INSERTION PORT-A-CATH (N/A) as a surgical intervention .  The patient's history has been reviewed, patient examined, no change in status, stable for surgery.  I have reviewed the patient's chart and labs.  Questions were answered to the patient's satisfaction.     Aviva Signs A

## 2014-02-04 NOTE — Transfer of Care (Signed)
Immediate Anesthesia Transfer of Care Note  Patient: Amanda Norris  Procedure(s) Performed: Procedure(s) (LRB): INSERTION PORT-A-CATH (Right)  Patient Location: PACU  Anesthesia Type: MAC  Level of Consciousness: awake  Airway & Oxygen Therapy: Patient Spontanous Breathing.   Post-op Assessment: Report given to PACU RN, Post -op Vital signs reviewed and stable and Patient moving all extremities  Post vital signs: Reviewed and stable  Complications: No apparent anesthesia complications

## 2014-02-04 NOTE — Anesthesia Postprocedure Evaluation (Signed)
  Anesthesia Post-op Note  Patient: Amanda Norris  Procedure(s) Performed: Procedure(s): INSERTION PORT-A-CATH (Right)  Patient Location: PACU  Anesthesia Type:MAC  Level of Consciousness: awake  Airway and Oxygen Therapy: Patient Spontanous Breathing  Post-op Pain: none  Post-op Assessment: Post-op Vital signs reviewed, Patient's Cardiovascular Status Stable, Respiratory Function Stable and No signs of Nausea or vomiting  Post-op Vital Signs: Reviewed and stable  Last Vitals:  Filed Vitals:   02/04/14 1148  BP: 130/68  Pulse: 94  Temp: 36.8 C  Resp: 17    Complications: No apparent anesthesia complications

## 2014-02-04 NOTE — Anesthesia Procedure Notes (Addendum)
Procedure Name: MAC Date/Time: 02/04/2014 11:17 AM Performed by: Vista Deck Pre-anesthesia Checklist: Patient identified, Emergency Drugs available, Suction available, Timeout performed and Patient being monitored Patient Re-evaluated:Patient Re-evaluated prior to inductionOxygen Delivery Method: Non-rebreather mask   Procedure Name: MAC Date/Time: 02/04/2014 11:05 AM Performed by: Vista Deck Pre-anesthesia Checklist: Patient identified, Emergency Drugs available, Suction available, Timeout performed and Patient being monitored Patient Re-evaluated:Patient Re-evaluated prior to inductionOxygen Delivery Method: Nasal Cannula

## 2014-02-04 NOTE — Discharge Instructions (Signed)
Implanted Port Insertion, Care After Refer to this sheet in the next few weeks. These instructions provide you with information on caring for yourself after your procedure. Your health care provider may also give you more specific instructions. Your treatment has been planned according to current medical practices, but problems sometimes occur. Call your health care provider if you have any problems or questions after your procedure. WHAT TO EXPECT AFTER THE PROCEDURE After your procedure, it is typical to have the following:   Discomfort at the port insertion site. Ice packs to the area will help.  Bruising on the skin over the port. This will subside in 3-4 days. HOME CARE INSTRUCTIONS  After your port is placed, you will get a manufacturer's information card. The card has information about your port. Keep this card with you at all times.   Know what kind of port you have. There are many types of ports available.   Wear a medical alert bracelet in case of an emergency. This can help alert health care workers that you have a port.   The port can stay in for as long as your health care provider believes it is necessary.   A home health care nurse may give medicines and take care of the port.   You or a family member can get special training and directions for giving medicine and taking care of the port at home.  SEEK MEDICAL CARE IF:   Your port does not flush or you are unable to get a blood return.   You have a fever or chills. SEEK IMMEDIATE MEDICAL CARE IF:  You have new fluid or pus coming from your incision.   You notice a bad smell coming from your incision site.   You have swelling, pain, or more redness at the incision or port site.   You have chest pain or shortness of breath. Document Released: 04/16/2013 Document Revised: 07/01/2013 Document Reviewed: 04/16/2013 ExitCare Patient Information 2015 [employer]. This information is not intended to replace  advice given to you by your health care provider. Make sure you discuss any questions you have with your health care provider.  

## 2014-02-06 ENCOUNTER — Encounter (HOSPITAL_COMMUNITY): Payer: Self-pay | Admitting: General Surgery

## 2014-02-08 NOTE — Progress Notes (Signed)
Amanda Norris., MD [place] [address]  Adenocarcinoma of colon - Plan: CBC with Differential, Comprehensive metabolic panel, CEA  CURRENT THERAPY: To start adjuvant FOLFOX chemotherapy  INTERVAL HISTORY: Amanda Norris 68 y.o. 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 Colon, biopsy, proximal transverse mass INVASIVE ADENOCARCINOMA.   12/29/2013 Definitive Surgery Colon, segmental resection for tumor, right - INVASIVE COLORECTAL ADENOCARCINOMA, 7 CM, EXTENDING INTO PERICOLONIC CONNECTIVE TISSUE. - METASTATIC CARCINOMA IN 6 OF 33 LYMPH NODES (6/33).    Adjuvant Chemotherapy FOLFOX x 6 cycles   I personally reviewed and went over laboratory results with the patient.  The results are noted within this dictation.  I reviewed the side effects of FOLFOX chemotherapy including cold intolerance and peripheral neuropathy.  The role of therapy is to reduce the risk of recurrence of disease.  We minimally discussed surveillance following 12 cycles of FOLFOX chemotherapy per NCCN guidelines.   All questions were answered.  She reports that Dr. Bubba Hales told her that he ausculted an extra heart beat.  I listened for a long time and did not hear anything abnormal on her cardiac exam.  I did not hear splitting of S2 even.  Oncologically, she denies any complaints and ROS questioning is negative.    Past Medical History  Diagnosis Date  . PONV (postoperative nausea and vomiting)   . Colon cancer   . Arthritis     wrist and thumbs    has Mass of pancreas and Adenocarcinoma of colon on her problem list.     is allergic to sulfa antibiotics.  [name] does not currently have medications on file.  Past Surgical History  Procedure Laterality Date  . Herniated disc      1996-c5-c7  . Colonoscopy N/A 12/12/2013   Procedure: COLONOSCOPY;  Surgeon: Rogene Houston, MD;  Location: [facility];  Service: Endoscopy;  Laterality: N/A;  730  . Eus N/A 12/18/2013    Procedure: UPPER ENDOSCOPIC ULTRASOUND (EUS) LINEAR;  Surgeon: Milus Banister, MD;  Location: [facility];  Service: Endoscopy;  Laterality: N/A;  . Partial colectomy N/A 12/29/2013    Procedure: RIGHT HEMICOLECTOMY;  Surgeon: Jamesetta So, MD;  Location: [facility] ORS;  Service: General;  Laterality: N/A;  . Portacath placement Right 02/04/2014    Procedure: INSERTION PORT-A-CATH;  Surgeon: Jamesetta So, MD;  Location: [facility];  Service: General;  Laterality: Right;    Denies any headaches, dizziness, double vision, fevers, chills, night sweats, nausea, vomiting, diarrhea, constipation, chest pain, heart palpitations, shortness of breath, blood in stool, black tarry stool, urinary pain, urinary burning, urinary frequency, hematuria.   PHYSICAL EXAMINATION  ECOG PERFORMANCE STATUS: 0 - Asymptomatic  There were no vitals filed for this visit.  GENERAL:alert, no distress, well nourished, well developed, comfortable, cooperative and smiling SKIN: skin color, texture, turgor are normal, no rashes or significant lesions HEAD: Normocephalic, No masses, lesions, tenderness or abnormalities EYES: normal, PERRLA, EOMI, Conjunctiva are pink and non-injected EARS: External ears normal OROPHARYNX:mucous membranes are moist  NECK: supple, trachea midline LYMPH:  not examined BREAST:not examined LUNGS: not examined HEART: regular rate & rhythm, no murmurs, no gallops, S1 normal and S2 normal ABDOMEN:not examined BACK: Back symmetric, no curvature. EXTREMITIES:less then 2 second capillary refill, no joint deformities, effusion, or inflammation, no skin discoloration, no cyanosis  NEURO:  alert & oriented x 3 with fluent speech, no focal motor/sensory deficits, gait normal   LABORATORY DATA: CBC    Component Value Date/Time   WBC 7.1 02/10/2014 1018    RBC 4.43 02/10/2014 1018   HGB 11.8* 02/10/2014 1018   HCT 36.7 02/10/2014 1018   PLT 217 02/10/2014 1018   MCV 82.8 02/10/2014 1018   MCH 26.6 02/10/2014 1018   MCHC 32.2 02/10/2014 1018   RDW 16.7* 02/10/2014 1018   LYMPHSABS 1.5 02/10/2014 1018   MONOABS 0.7 02/10/2014 1018   EOSABS 0.1 02/10/2014 1018   BASOSABS 0.0 02/10/2014 1018      Chemistry      Component Value Date/Time   NA 138 02/10/2014 1018   K 3.9 02/10/2014 1018   CL 103 02/10/2014 1018   CO2 23 02/10/2014 1018   BUN 12 02/10/2014 1018   CREATININE 0.66 02/10/2014 1018      Component Value Date/Time   CALCIUM 9.1 02/10/2014 1018   ALKPHOS 70 02/10/2014 1018   AST 18 02/10/2014 1018   ALT 11 02/10/2014 1018   BILITOT 0.2* 02/10/2014 1018     Lab Results  Component Value Date   CEA 2.3 01/27/2014      ASSESSMENT:  1. Stage IIIB adenocarcinoma of proximal transverse colon, S/P definitive surgery on 12/29/2013 by Dr. Arnoldo Morale.  Now undergoing adjuvant chemotherapy with FOLFOX  Patient Active Problem List   Diagnosis Date Noted  . Adenocarcinoma of colon 12/29/2013  . Mass of pancreas 12/08/2013    PLAN:  1. I personally reviewed and went over laboratory results with the patient.  The results are noted within this dictation. 2. Pre-chemo labs: CBC diff, CMET 3. Labs every 4 weeks: CEA 4. Return in 2 weeks for follow-up   THERAPY PLAN:  She will undergo adjuvant FOLFOX chemotherapy to decrease her risk of recurrence.  We will manage side effects as they arise.  All questions were answered. The patient knows to call the clinic with any problems, questions or concerns. We can certainly see the patient much sooner if necessary.  Patient and plan discussed with Dr. Farrel Gobble and he is in agreement with the aforementioned.   Amanda Norris 02/10/2014

## 2014-02-09 ENCOUNTER — Ambulatory Visit (HOSPITAL_COMMUNITY): Payer: Medicare Other

## 2014-02-10 ENCOUNTER — Encounter (HOSPITAL_COMMUNITY): Payer: Medicare Other | Attending: Oncology

## 2014-02-10 ENCOUNTER — Encounter (HOSPITAL_COMMUNITY): Payer: Self-pay | Admitting: Oncology

## 2014-02-10 ENCOUNTER — Encounter (HOSPITAL_BASED_OUTPATIENT_CLINIC_OR_DEPARTMENT_OTHER): Payer: Medicare Other | Admitting: Oncology

## 2014-02-10 VITALS — BP 140/65 | HR 88 | Temp 98.4°F | Resp 18 | Wt 156.8 lb

## 2014-02-10 DIAGNOSIS — C184 Malignant neoplasm of transverse colon: Secondary | ICD-10-CM | POA: Diagnosis not present

## 2014-02-10 DIAGNOSIS — Z5111 Encounter for antineoplastic chemotherapy: Secondary | ICD-10-CM | POA: Diagnosis not present

## 2014-02-10 DIAGNOSIS — D649 Anemia, unspecified: Secondary | ICD-10-CM | POA: Insufficient documentation

## 2014-02-10 DIAGNOSIS — K869 Disease of pancreas, unspecified: Secondary | ICD-10-CM | POA: Diagnosis not present

## 2014-02-10 DIAGNOSIS — Z9049 Acquired absence of other specified parts of digestive tract: Secondary | ICD-10-CM | POA: Insufficient documentation

## 2014-02-10 DIAGNOSIS — R918 Other nonspecific abnormal finding of lung field: Secondary | ICD-10-CM | POA: Insufficient documentation

## 2014-02-10 DIAGNOSIS — C189 Malignant neoplasm of colon, unspecified: Secondary | ICD-10-CM

## 2014-02-10 LAB — COMPREHENSIVE METABOLIC PANEL
ALT: 11 U/L (ref 0–35)
ANION GAP: 12 (ref 5–15)
AST: 18 U/L (ref 0–37)
Albumin: 3.7 g/dL (ref 3.5–5.2)
Alkaline Phosphatase: 70 U/L (ref 39–117)
BILIRUBIN TOTAL: 0.2 mg/dL — AB (ref 0.3–1.2)
BUN: 12 mg/dL (ref 6–23)
CHLORIDE: 103 meq/L (ref 96–112)
CO2: 23 meq/L (ref 19–32)
CREATININE: 0.66 mg/dL (ref 0.50–1.10)
Calcium: 9.1 mg/dL (ref 8.4–10.5)
GFR calc Af Amer: >90 mL/min (ref >90)
GFR, EST NON AFRICAN AMERICAN: 89 mL/min — AB (ref >90)
Glucose, Bld: 121 mg/dL — ABNORMAL HIGH (ref 70–99)
Potassium: 3.9 mEq/L (ref 3.7–5.3)
Sodium: 138 mEq/L (ref 137–147)
Total Protein: 7.2 g/dL (ref 6.0–8.3)

## 2014-02-10 LAB — CBC WITH DIFFERENTIAL/PLATELET
Basophils Absolute: 0 10*3/uL (ref 0.0–0.1)
Basophils Relative: 1 % (ref 0–1)
Eosinophils Absolute: 0.1 10*3/uL (ref 0.0–0.7)
Eosinophils Relative: 2 % (ref 0–5)
HEMATOCRIT: 36.7 % (ref 36.0–46.0)
HEMOGLOBIN: 11.8 g/dL — AB (ref 12.0–15.0)
LYMPHS PCT: 21 % (ref 12–46)
Lymphs Abs: 1.5 10*3/uL (ref 0.7–4.0)
MCH: 26.6 pg (ref 26.0–34.0)
MCHC: 32.2 g/dL (ref 30.0–36.0)
MCV: 82.8 fL (ref 78.0–100.0)
MONO ABS: 0.7 10*3/uL (ref 0.1–1.0)
MONOS PCT: 10 % (ref 3–12)
Neutro Abs: 4.8 10*3/uL (ref 1.7–7.7)
Neutrophils Relative %: 66 % (ref 43–77)
Platelets: 217 10*3/uL (ref 150–400)
RBC: 4.43 MIL/uL (ref 3.87–5.11)
RDW: 16.7 % — ABNORMAL HIGH (ref 11.5–15.5)
WBC: 7.1 10*3/uL (ref 4.0–10.5)

## 2014-02-10 LAB — CEA: CEA: 1.9 ng/mL (ref 0.0–5.0)

## 2014-02-10 MED ORDER — HEPARIN SOD (PORK) LOCK FLUSH 100 UNIT/ML IV SOLN
500.0000 [IU] | Freq: Once | INTRAVENOUS | Status: DC | PRN
  Filled 2014-02-10: qty 5

## 2014-02-10 MED ORDER — SODIUM CHLORIDE 0.9 % IV SOLN
Freq: Once | INTRAVENOUS | Status: AC
  Administered 2014-02-10: 8 mg via INTRAVENOUS
  Filled 2014-02-10: qty 4

## 2014-02-10 MED ORDER — DEXTROSE 5 % IV SOLN
Freq: Once | INTRAVENOUS | Status: AC
  Administered 2014-02-10: 10:00:00 via INTRAVENOUS

## 2014-02-10 MED ORDER — FLUOROURACIL CHEMO INJECTION 5 GM/100ML
2400.0000 mg/m2 | INTRAVENOUS | Status: DC
  Administered 2014-02-10: 4200 mg via INTRAVENOUS
  Filled 2014-02-10: qty 84

## 2014-02-10 MED ORDER — DEXAMETHASONE SODIUM PHOSPHATE 10 MG/ML IJ SOLN: 10.0000 mg | Freq: Once | INTRAMUSCULAR | Status: DC

## 2014-02-10 MED ORDER — FLUOROURACIL CHEMO INJECTION 2.5 GM/50ML
400.0000 mg/m2 | Freq: Once | INTRAVENOUS | Status: AC
  Administered 2014-02-10: 700 mg via INTRAVENOUS
  Filled 2014-02-10: qty 14

## 2014-02-10 MED ORDER — SODIUM CHLORIDE 0.9 % IV SOLN: 8.0000 mg | Freq: Once | INTRAVENOUS | Status: DC

## 2014-02-10 MED ORDER — SODIUM CHLORIDE 0.9 % IJ SOLN: 10.0000 mL | INTRAMUSCULAR | Status: DC | PRN

## 2014-02-10 MED ORDER — OXALIPLATIN CHEMO INJECTION 100 MG/20ML
85.0000 mg/m2 | Freq: Once | INTRAVENOUS | Status: AC
  Administered 2014-02-10: 150 mg via INTRAVENOUS
  Filled 2014-02-10: qty 30

## 2014-02-10 MED ORDER — LEUCOVORIN CALCIUM INJECTION 350 MG
400.0000 mg/m2 | Freq: Once | INTRAVENOUS | Status: AC
  Administered 2014-02-10: 704 mg via INTRAVENOUS
  Filled 2014-02-10: qty 35.2

## 2014-02-10 NOTE — Progress Notes (Signed)
[name] tolerated infusions well and without incident; verbalizes understanding for follow-up. Adriamycin pushed per protocol.   Home infusion pump teaching completed, information sheet given for home use.  No distress noted at time of discharge and patient was discharged home with husband.

## 2014-02-10 NOTE — Patient Instructions (Signed)
[facility] Discharge Instructions  RECOMMENDATIONS MADE BY THE CONSULTANT AND ANY TEST RESULTS WILL BE SENT TO YOUR REFERRING PHYSICIAN.  EXAM FINDINGS BY THE PHYSICIAN TODAY AND SIGNS OR SYMPTOMS TO REPORT TO CLINIC OR PRIMARY PHYSICIAN: Exam and findings as discussed by Meriel Flavors.  MEDICATIONS PRESCRIBED:  Continue as prescribed  INSTRUCTIONS/FOLLOW-UP: Chemotherapy Cycle 1 as scheduled today. Continue with all appointments as scheduled. Report any issues/concerns to clinic as needed.  Thank you for choosing [facility] to provide your oncology and hematology care.  To afford each patient quality time with our providers, please arrive at least 15 minutes before your scheduled appointment time.  With your help, our goal is to use those 15 minutes to complete the necessary work-up to ensure our physicians have the information they need to help with your evaluation and healthcare recommendations.    Effective January 1st, 2014, we ask that you re-schedule your appointment with our physicians should you arrive 10 or more minutes late for your appointment.  We strive to give you quality time with our providers, and arriving late affects you and other patients whose appointments are after yours.    Again, thank you for choosing [facility].  Our hope is that these requests will decrease the amount of time that you wait before being seen by our physicians.       _____________________________________________________________  Should you have questions after your visit to [facility], please contact our office at [phone] between the hours of 8:30 a.m. and 4:30 p.m.  Voicemails left after 4:30 p.m. will not be returned until the following business day.  For prescription refill requests, have your pharmacy contact our office with your prescription refill request.     _______________________________________________________________  We hope that we have given you very good care.  You may receive a patient satisfaction survey in the mail, please complete it and return it as soon as possible.  We value your feedback!  _______________________________________________________________  Have you asked about our STAR program?  STAR stands for Survivorship Training and Rehabilitation, and this is a nationally recognized cancer care program that focuses on survivorship and rehabilitation.  Cancer and cancer treatments may cause problems, such as, pain, making you feel tired and keeping you from doing the things that you need or want to do. Cancer rehabilitation can help. Our goal is to reduce these troubling effects and help you have the best quality of life possible.  You may receive a survey from a nurse that asks questions about your current state of health.  Based on the survey results, all eligible patients will be referred to the [facility] program for an evaluation so we can better serve you!  A frequently asked questions sheet is available upon request.

## 2014-02-11 ENCOUNTER — Telehealth (HOSPITAL_COMMUNITY): Payer: Self-pay

## 2014-02-11 NOTE — Telephone Encounter (Signed)
Rn spoke to the patient for 24 hour follow. Stated she was felling well and pump infusing without difficulty. Advised to call the clinic for any further questions

## 2014-02-12 ENCOUNTER — Encounter (HOSPITAL_BASED_OUTPATIENT_CLINIC_OR_DEPARTMENT_OTHER): Payer: Medicare Other

## 2014-02-12 VITALS — BP 144/68 | HR 61 | Temp 98.9°F | Resp 20

## 2014-02-12 DIAGNOSIS — C189 Malignant neoplasm of colon, unspecified: Secondary | ICD-10-CM | POA: Diagnosis not present

## 2014-02-12 MED ORDER — SODIUM CHLORIDE 0.9 % IJ SOLN
10.0000 mL | Freq: Once | INTRAMUSCULAR | Status: AC
  Administered 2014-02-12: 10 mL via INTRAVENOUS

## 2014-02-12 MED ORDER — HEPARIN SOD (PORK) LOCK FLUSH 100 UNIT/ML IV SOLN
500.0000 [IU] | Freq: Once | INTRAVENOUS | Status: AC
  Administered 2014-02-12: 500 [IU] via INTRAVENOUS

## 2014-02-12 MED ORDER — HEPARIN SOD (PORK) LOCK FLUSH 100 UNIT/ML IV SOLN
INTRAVENOUS | Status: AC
  Filled 2014-02-12: qty 5

## 2014-02-12 NOTE — Progress Notes (Signed)
Tolerated chemo well. Did complain of a shooting intermittent pain on rt side of head. She stated that it has started since chemo began. I talked to Dr. Barnet Glasgow and he said that this was likely coming from Oxaliplatin. I asked patient to call me tomorrow to let me know how the pain in her head was. She said ok.

## 2014-02-13 ENCOUNTER — Other Ambulatory Visit (HOSPITAL_COMMUNITY): Payer: Self-pay | Admitting: *Deleted

## 2014-02-13 ENCOUNTER — Telehealth (HOSPITAL_COMMUNITY): Payer: Self-pay | Admitting: *Deleted

## 2014-02-13 DIAGNOSIS — K8689 Other specified diseases of pancreas: Secondary | ICD-10-CM

## 2014-02-13 DIAGNOSIS — C189 Malignant neoplasm of colon, unspecified: Secondary | ICD-10-CM

## 2014-02-13 MED ORDER — ONDANSETRON HCL 4 MG PO TABS: 4.0000 mg | ORAL_TABLET | Freq: Three times a day (TID) | ORAL | Status: DC | PRN

## 2014-02-13 NOTE — Telephone Encounter (Signed)
Patient is doing ok today but does feel a little tired. No headaches today. Patient said she had some nausea & vomiting after 76fu infusion pump was removed yesterday. No nausea/vomiting today. I will refill her Zofran 4mg  per Gershon Mussel.

## 2014-02-17 ENCOUNTER — Encounter (HOSPITAL_COMMUNITY): Payer: Self-pay

## 2014-02-17 ENCOUNTER — Encounter (HOSPITAL_BASED_OUTPATIENT_CLINIC_OR_DEPARTMENT_OTHER): Payer: Medicare Other

## 2014-02-17 VITALS — BP 144/64 | HR 89 | Temp 98.2°F | Resp 20 | Wt 156.0 lb

## 2014-02-17 DIAGNOSIS — D500 Iron deficiency anemia secondary to blood loss (chronic): Secondary | ICD-10-CM

## 2014-02-17 DIAGNOSIS — K869 Disease of pancreas, unspecified: Secondary | ICD-10-CM | POA: Diagnosis not present

## 2014-02-17 DIAGNOSIS — R300 Dysuria: Secondary | ICD-10-CM

## 2014-02-17 DIAGNOSIS — K8689 Other specified diseases of pancreas: Secondary | ICD-10-CM

## 2014-02-17 DIAGNOSIS — C189 Malignant neoplasm of colon, unspecified: Secondary | ICD-10-CM | POA: Diagnosis not present

## 2014-02-17 DIAGNOSIS — C184 Malignant neoplasm of transverse colon: Secondary | ICD-10-CM | POA: Diagnosis not present

## 2014-02-17 LAB — CBC WITH DIFFERENTIAL/PLATELET
BASOS ABS: 0 10*3/uL (ref 0.0–0.1)
Basophils Relative: 0 % (ref 0–1)
Eosinophils Absolute: 0.2 10*3/uL (ref 0.0–0.7)
Eosinophils Relative: 3 % (ref 0–5)
HEMATOCRIT: 39.2 % (ref 36.0–46.0)
Hemoglobin: 12.7 g/dL (ref 12.0–15.0)
Lymphocytes Relative: 23 % (ref 12–46)
Lymphs Abs: 1.9 10*3/uL (ref 0.7–4.0)
MCH: 26.5 pg (ref 26.0–34.0)
MCHC: 32.4 g/dL (ref 30.0–36.0)
MCV: 81.7 fL (ref 78.0–100.0)
MONO ABS: 0.5 10*3/uL (ref 0.1–1.0)
Monocytes Relative: 6 % (ref 3–12)
NEUTROS ABS: 5.5 10*3/uL (ref 1.7–7.7)
NEUTROS PCT: 68 % (ref 43–77)
Platelets: 236 10*3/uL (ref 150–400)
RBC: 4.8 MIL/uL (ref 3.87–5.11)
RDW: 15.9 % — AB (ref 11.5–15.5)
WBC: 8.1 10*3/uL (ref 4.0–10.5)

## 2014-02-17 LAB — URINALYSIS, ROUTINE W REFLEX MICROSCOPIC
BILIRUBIN URINE: NEGATIVE
Glucose, UA: NEGATIVE mg/dL
KETONES UR: NEGATIVE mg/dL
NITRITE: NEGATIVE
PROTEIN: NEGATIVE mg/dL
SPECIFIC GRAVITY, URINE: 1.025 (ref 1.005–1.030)
Urobilinogen, UA: 0.2 mg/dL (ref 0.0–1.0)
pH: 6 (ref 5.0–8.0)

## 2014-02-17 LAB — URINE MICROSCOPIC-ADD ON

## 2014-02-17 MED ORDER — CIPROFLOXACIN HCL 250 MG PO TABS: 250.0000 mg | ORAL_TABLET | Freq: Two times a day (BID) | ORAL | Status: DC

## 2014-02-17 NOTE — Progress Notes (Signed)
[name] presented for labwork. Labs per MD order drawn via Peripheral Line 23 gauge needle inserted in rt ac  Good blood return present. Procedure without incident.  Needle removed intact. Patient tolerated procedure well.

## 2014-02-17 NOTE — Progress Notes (Signed)
[facility]  OFFICE PROGRESS NOTE  Glo Herring., MD [place] [address]  DIAGNOSIS: Dysuria - Plan: Urinalysis, Routine w reflex microscopic, Urine culture, Urine culture, Urinalysis, Routine w reflex microscopic, Ferritin, Ferritin  Adenocarcinoma of colon - Plan: CBC with Differential, Ferritin, Ferritin, Ferritin, CANCELED: Comprehensive metabolic panel  Mass of pancreas - Plan: Ferritin, Ferritin  Chronic blood loss anemia  Chief Complaint  Patient presents with  . Stage III-B colon cancer  . Dysuria  . Constipation    CURRENT THERAPY: FOLFOX cycle 1 one week ago.  INTERVAL HISTORY: Amanda Norris 68 y.o. female returns for followup after initiation of FOLFOX adjuvant chemotherapy for Stage III-be moderately differentiated adenocarcinoma of the proximal transverse colon, status post resection on 12/29/2013.  She experienced 1 episode of vomiting when the remainder of the drug in her 48 hour infusion bag was given as a bolus when she appeared to have IV access discontinued. This morning she awoke with urinary urgency and dysuria and took Cystex with relief. She denies any peripheral paresthesias and was very careful not to induce cold-induced neuropathy or dyspnea. Appetite has been good with no nausea, vomiting, lower extremity swelling or redness but with constipation alternating with diarrhea. since starting on iron supplements. External hemorrhoids have flared.   MEDICAL HISTORY: Past Medical History  Diagnosis Date  . PONV (postoperative nausea and vomiting)   . Colon cancer   . Arthritis     wrist and thumbs    INTERIM HISTORY: has Mass of pancreas and Adenocarcinoma of colon on her problem list.     Adenocarcinoma of colon    12/09/2013  Initial Diagnosis  1. Colon, biopsy, proximal transverse mass INVASIVE ADENOCARCINOMA.    12/29/2013  Definitive Surgery  Colon, segmental resection for tumor, right  - INVASIVE COLORECTAL ADENOCARCINOMA, 7 CM, EXTENDING INTO PERICOLONIC CONNECTIVE TISSUE. - METASTATIC CARCINOMA IN 6 OF 33 LYMPH NODES (6/33).     Adjuvant Chemotherapy  FOLFOX x 6 cycles      ALLERGIES:  is allergic to sulfa antibiotics.  MEDICATIONS: has a current medication list which includes the following prescription(s): bioflavonoid products, black cohosh, cranberry, dextrose 5 % SOLN 1,000 mL with fluorouracil 5 GM/100ML SOLN, ferrous sulfate, garlic, hydrocodone-acetaminophen, ibuprofen, lactobacillus, leucovorin calcium, lidocaine-prilocaine, multivitamin, fish oil, ondansetron, oxaliplatin, and prochlorperazine.  SURGICAL HISTORY:  Past Surgical History  Procedure Laterality Date  . Herniated disc      [id number]  . Colonoscopy N/A 12/12/2013    Procedure: COLONOSCOPY;  Surgeon: Rogene Houston, MD;  Location: [facility];  Service: Endoscopy;  Laterality: N/A;  730  . Eus N/A 12/18/2013    Procedure: UPPER ENDOSCOPIC ULTRASOUND (EUS) LINEAR;  Surgeon: Milus Banister, MD;  Location: [facility];  Service: Endoscopy;  Laterality: N/A;  . Partial colectomy N/A 12/29/2013    Procedure: RIGHT HEMICOLECTOMY;  Surgeon: Jamesetta So, MD;  Location: [facility] ORS;  Service: General;  Laterality: N/A;  . Portacath placement Right 02/04/2014    Procedure: INSERTION PORT-A-CATH;  Surgeon: Jamesetta So, MD;  Location: [facility]S;  Service: General;  Laterality: Right;    FAMILY HISTORY: family history is not on file.  SOCIAL HISTORY:  reports that she quit smoking about 54 years ago. Her smoking use included Cigarettes. She has a 1.25 pack-year smoking history. She has never used smokeless tobacco. She reports that she does not drink alcohol or use illicit drugs.  REVIEW OF SYSTEMS:  Other than that discussed above is noncontributory.  PHYSICAL EXAMINATION: ECOG PERFORMANCE STATUS: 1 - Symptomatic but completely ambulatory  Blood pressure 144/64, pulse 89, temperature 98.2 F (36.8 C),  temperature source Oral, resp. rate 20, weight 156 lb (70.761 kg).  GENERAL:alert, no distress and comfortable SKIN: skin color, texture, turgor are normal, no rashes or significant lesions EYES: PERLA; Conjunctiva are pink and non-injected, sclera clear SINUSES: No redness or tenderness over maxillary or ethmoid sinuses OROPHARYNX:no exudate, no erythema on lips, buccal mucosa, or tongue. NECK: supple, thyroid normal size, non-tender, without nodularity. No masses CHEST: Normal AP diameter with light port in place.  LYMPH:  no palpable lymphadenopathy in the cervical, axillary or inguinal LUNGS: clear to auscultation and percussion with normal breathing effort HEART: regular rate & rhythm and no murmurs. ABDOMEN:abdomen soft, non-tender and normal bowel sounds. Surgical well-healed with no hepatomegaly, ascites, or CVA tenderness. No suprapubic tenderness.  MUSCULOSKELETAL:no cyanosis of digits and no clubbing. Range of motion normal.  NEURO: alert & oriented x 3 with fluent speech, no focal motor/sensory deficits   LABORATORY DATA: Office Visit on 02/17/2014  Component Date Value Ref Range Status  . Color, Urine 02/17/2014 YELLOW  YELLOW Final  . APPearance 02/17/2014 CLEAR  CLEAR Final  . Specific Gravity, Urine 02/17/2014 1.025  1.005 - 1.030 Final  . pH 02/17/2014 6.0  5.0 - 8.0 Final  . Glucose, UA 02/17/2014 NEGATIVE  NEGATIVE mg/dL Final  . Hgb urine dipstick 02/17/2014 TRACE* NEGATIVE Final  . Bilirubin Urine 02/17/2014 NEGATIVE  NEGATIVE Final  . Ketones, ur 02/17/2014 NEGATIVE  NEGATIVE mg/dL Final  . Protein, ur 02/17/2014 NEGATIVE  NEGATIVE mg/dL Final  . Urobilinogen, UA 02/17/2014 0.2  0.0 - 1.0 mg/dL Final  . Nitrite 02/17/2014 NEGATIVE  NEGATIVE Final  . Leukocytes, UA 02/17/2014 SMALL* NEGATIVE Final  . Squamous Epithelial / LPF 02/17/2014 FEW* RARE Final  . WBC, UA 02/17/2014 11-20  <3 WBC/hpf Final  . RBC / HPF 02/17/2014 0-2  <3 RBC/hpf Final  . Bacteria, UA  02/17/2014 FEW* RARE Final  . Crystals 02/17/2014 CA OXALATE CRYSTALS* NEGATIVE Final  Infusion on 02/10/2014  Component Date Value Ref Range Status  . WBC 02/10/2014 7.1  4.0 - 10.5 K/uL Final  . RBC 02/10/2014 4.43  3.87 - 5.11 MIL/uL Final  . Hemoglobin 02/10/2014 11.8* 12.0 - 15.0 g/dL Final  . HCT 02/10/2014 36.7  36.0 - 46.0 % Final  . MCV 02/10/2014 82.8  78.0 - 100.0 fL Final  . MCH 02/10/2014 26.6  26.0 - 34.0 pg Final  . MCHC 02/10/2014 32.2  30.0 - 36.0 g/dL Final  . RDW 02/10/2014 16.7* 11.5 - 15.5 % Final  . Platelets 02/10/2014 217  150 - 400 K/uL Final  . Neutrophils Relative % 02/10/2014 66  43 - 77 % Final  . Neutro Abs 02/10/2014 4.8  1.7 - 7.7 K/uL Final  . Lymphocytes Relative 02/10/2014 21  12 - 46 % Final  . Lymphs Abs 02/10/2014 1.5  0.7 - 4.0 K/uL Final  . Monocytes Relative 02/10/2014 10  3 - 12 % Final  . Monocytes Absolute 02/10/2014 0.7  0.1 - 1.0 K/uL Final  . Eosinophils Relative 02/10/2014 2  0 - 5 % Final  . Eosinophils Absolute 02/10/2014 0.1  0.0 - 0.7 K/uL Final  . Basophils Relative 02/10/2014 1  0 - 1 % Final  . Basophils Absolute 02/10/2014 0.0  0.0 - 0.1 K/uL Final  . Sodium 02/10/2014 138  137 - 147  mEq/L Final  . Potassium 02/10/2014 3.9  3.7 - 5.3 mEq/L Final  . Chloride 02/10/2014 103  96 - 112 mEq/L Final  . CO2 02/10/2014 23  19 - 32 mEq/L Final  . Glucose, Bld 02/10/2014 121* 70 - 99 mg/dL Final  . BUN 02/10/2014 12  6 - 23 mg/dL Final  . Creatinine, Ser 02/10/2014 0.66  0.50 - 1.10 mg/dL Final  . Calcium 02/10/2014 9.1  8.4 - 10.5 mg/dL Final  . Total Protein 02/10/2014 7.2  6.0 - 8.3 g/dL Final  . Albumin 02/10/2014 3.7  3.5 - 5.2 g/dL Final  . AST 02/10/2014 18  0 - 37 U/L Final  . ALT 02/10/2014 11  0 - 35 U/L Final  . Alkaline Phosphatase 02/10/2014 70  39 - 117 U/L Final  . Total Bilirubin 02/10/2014 0.2* 0.3 - 1.2 mg/dL Final  . GFR calc non Af Amer 02/10/2014 89* >90 mL/min Final  . GFR calc Af Amer 02/10/2014 >90  >90 mL/min  Final   Comment: (NOTE)                          The eGFR has been calculated using the CKD EPI equation.                          This calculation has not been validated in all clinical situations.                          eGFR's persistently <90 mL/min signify possible Chronic Kidney                          Disease.  . Anion gap 02/10/2014 12  5 - 15 Final  . CEA 02/10/2014 1.9  0.0 - 5.0 ng/mL Final   Performed at [facility] Visit on 01/27/2014  Component Date Value Ref Range Status  . WBC 01/27/2014 6.3  4.0 - 10.5 K/uL Final  . RBC 01/27/2014 4.28  3.87 - 5.11 MIL/uL Final  . Hemoglobin 01/27/2014 11.0* 12.0 - 15.0 g/dL Final  . HCT 01/27/2014 34.6* 36.0 - 46.0 % Final  . MCV 01/27/2014 80.8  78.0 - 100.0 fL Final  . MCH 01/27/2014 25.7* 26.0 - 34.0 pg Final  . MCHC 01/27/2014 31.8  30.0 - 36.0 g/dL Final  . RDW 01/27/2014 15.4  11.5 - 15.5 % Final  . Platelets 01/27/2014 219  150 - 400 K/uL Final  . Neutrophils Relative % 01/27/2014 67  43 - 77 % Final  . Neutro Abs 01/27/2014 4.2  1.7 - 7.7 K/uL Final  . Lymphocytes Relative 01/27/2014 22  12 - 46 % Final  . Lymphs Abs 01/27/2014 1.4  0.7 - 4.0 K/uL Final  . Monocytes Relative 01/27/2014 9  3 - 12 % Final  . Monocytes Absolute 01/27/2014 0.6  0.1 - 1.0 K/uL Final  . Eosinophils Relative 01/27/2014 2  0 - 5 % Final  . Eosinophils Absolute 01/27/2014 0.1  0.0 - 0.7 K/uL Final  . Basophils Relative 01/27/2014 0  0 - 1 % Final  . Basophils Absolute 01/27/2014 0.0  0.0 - 0.1 K/uL Final  . Magnesium 01/27/2014 2.2  1.5 - 2.5 mg/dL Final  . CEA 01/27/2014 2.3  0.0 - 5.0 ng/mL Final   Performed at [facility]  . Sodium 01/27/2014 141  137 -  147 mEq/L Final  . Potassium 01/27/2014 4.0  3.7 - 5.3 mEq/L Final  . Chloride 01/27/2014 105  96 - 112 mEq/L Final  . CO2 01/27/2014 23  19 - 32 mEq/L Final  . Glucose, Bld 01/27/2014 114* 70 - 99 mg/dL Final  . BUN 01/27/2014 12  6 - 23 mg/dL Final  . Creatinine,  Ser 01/27/2014 0.65  0.50 - 1.10 mg/dL Final  . Calcium 01/27/2014 9.4  8.4 - 10.5 mg/dL Final  . Total Protein 01/27/2014 7.0  6.0 - 8.3 g/dL Final  . Albumin 01/27/2014 3.6  3.5 - 5.2 g/dL Final  . AST 01/27/2014 17  0 - 37 U/L Final  . ALT 01/27/2014 12  0 - 35 U/L Final  . Alkaline Phosphatase 01/27/2014 77  39 - 117 U/L Final  . Total Bilirubin 01/27/2014 <0.2* 0.3 - 1.2 mg/dL Final  . GFR calc non Af Amer 01/27/2014 90* >90 mL/min Final  . GFR calc Af Amer 01/27/2014 >90  >90 mL/min Final   Comment: (NOTE)                          The eGFR has been calculated using the CKD EPI equation.                          This calculation has not been validated in all clinical situations.                          eGFR's persistently <90 mL/min signify possible Chronic Kidney                          Disease.  Georgiann Hahn gap 01/27/2014 13  5 - 15 Final    PATHOLOGY:  for MAKAYLI, BRACKEN ([id number]) Patient: LADONNE, SHARPLES Collected: 12/29/2013 Client: [facility] Accession: [id number] Received: 12/29/2013 Aviva Signs DOB: 1945-10-10 Age: 3 Gender: F Reported: 12/31/2013 [address] Patient Ph: [phone] MRN #: [id number] Linna Hoff [place] [id number] Visit #: [id number] Chart #: Phone: [phone] Fax: CC: REPORT OF SURGICAL PATHOLOGY FINAL DIAGNOSIS Diagnosis Colon, segmental resection for tumor, right - INVASIVE COLORECTAL ADENOCARCINOMA, 7 CM, EXTENDING INTO PERICOLONIC CONNECTIVE TISSUE. - METASTATIC CARCINOMA IN 6 OF 33 LYMPH NODES (6/33). - MARGINS NOT INVOLVED. Microscopic Comment COLON AND RECTUM (INCLUDING TRANS-ANAL RESECTION): Specimen: Right colon. Procedure: Right colectomy. Tumor site: Transverse. Specimen integrity: Intact. Macroscopic intactness of mesorectum: Not applicable. Macroscopic tumor perforation: No Invasive tumor: Maximum size: 7 cm Histologic type(s): Colorectal adenocarcinoma. Histologic grade and differentiation: G2: moderately differentiated/low  grade Type of polyp in which invasive carcinoma arose: No residual polyp. Microscopic extension of invasive tumor: Into pericolonic connective tissue. Lymph-Vascular invasion: Present. Peri-neural invasion: Present. Tumor deposit(s) (discontinuous extramural extension): No. Resection margins: Proximal margin: Free of tumor. Distal margin: Free of tumor. Circumferential (radial) (posterior ascending, posterior descending; lateral and posterior mid-rectum; and entire lower 1/3 rectum): N/A Mesenteric margin (sigmoid and transverse): Free of tumor. Distance closest margin (if all above margins negative): 6.5 cm from mesenteric margin. Treatment effect (neo-adjuvant therapy): No Additional polyp(s): No Non-neoplastic findings: Submucosal lipoma and benign appendix with fibrous obliteration of the lumen. 1 of 3 FINAL for [place], PATRCIA SCHNEPP ([id number]) Microscopic Comment(continued) Lymph nodes: number examined 33; number positive: 6 Pathologic Staging: pT3, pN2a, MX Ancillary studies: Can be performed if requested. (JDP:ecj 12/31/2013) Claudette Laws MD Pathologist,  Electronic Signature (Case signed 12/31/2013) Specimen Gross and Clinical Information Specimen(s) Obtained: Colon, segmental resection for tumor, right Specimen Clinical Information colon cancer (kp) Gross Specimen: Right colon, received in formalin. Specimen integrity: Intact. Specimen length: The specimen consists of 7 cm of terminal ileum and 44 cm of colon. There is an additional separate 3.5 x 1.5 x 1 cm portion of mucosa and intestinal wall with staple lines present. Tumor location: The tumor is located in the distal portion of the colon and is circumferential. Tumor size: The tumor consists of a circumferential, ulcerated, tan-red sessile mass measuring 6.5 cm in length x 7 cm in circumference. The tumor measures up to 2 cm in thickness and the cut surface is slightly mucinous. Percent of bowel circumference involved:  100% Tumor distance to margins: Proximal: 33 cm Distal: 7 cm Mesenteric (sigmoid and transverse): 6.5 cm Macroscopic extent of tumor invasion: The tumor involves the full thickness of the wall and extends into the surrounding fat. There is omentum adherent along the serosal surface at the tumor and the tumor extends into the adherent omentum. Total presumed lymph nodes: There are 33 rubbery ovoid nodules tentatively identified as lymph nodes varying in size from 0.3 to 1.4 cm in greatest dimension. Extramural satellite tumor nodules: Not grossly identified. Mucosal polyp(s): There is a 1.2 cm submucosal lipoma in the mid portion of the colon. Additional findings: The uninvolved mucosa is glistening and tan. The appendix is present and measures 4.5 cm in length x 0.6 cm in diameter. Block summary: Seventeen blocks submitted: A = proximal margin. B = distal margin. C = tumor to include adherent omentum. D = proximal tumor to uninvolved mucosa transition. E = distal tumor to uninvolved mucosa transition. F, G = deep tumor extension. H = tissue for molecular testing. I = submucosal lipoma. J = appendix. K - P = lymph nodes. Q = sections from separate portion of mucosa. (GRP:ecj 12/30/2013) 2 of 3 FINAL for [place], DELANEE XIN ([id number]) Report signed out from the following location(s) Technical Component and Interpretation performed at [facility], [place], [place] [address]. CLIA #: [id number],    Urinalysis    Component Value Date/Time   COLORURINE YELLOW 02/17/2014 1423    RADIOGRAPHIC STUDIES: Nm Bone Scan Whole Body  02/02/2014   CLINICAL DATA:  Back pain, right wrist pain, left wrist pain  EXAM: NUCLEAR MEDICINE WHOLE BODY BONE SCAN  TECHNIQUE: Whole body anterior and posterior images were obtained approximately 3 hours after intravenous injection of radiopharmaceutical.  RADIOPHARMACEUTICALS:  Twenty-five mCi Technetium-99 MDP  COMPARISON:   12/31/2013 and 12/25/2013  FINDINGS: There is mild uptake in both wrists right greater tube on lead. There is no abnormal uptake in the spine except for a very subtle focus over T5 posteriorly, likely degenerative.  There is a small focus of mild uptake over the soft tissues of the left axilla. Mild uptake is seen over the thyroid.  There are bilateral flank kidneys.  There is mild uptake involving both anterior iliac crests, possibly representing enthesopathy.  IMPRESSION: 1. Very mild left wrist and mild to moderate right wrist uptake, nonspecific and likely degenerative. 2. Mild uptake over the thyroid, a nonspecific finding. 3. Small focus of left axillary soft tissue uptake. Significance is uncertain and this is a nonspecific finding. Correlation with recent CT scan does not reveal significant adenopathy or other lesion this area. 4. Mild uptake over the anterior iliac crests bilaterally, a nonspecific finding. No bony lesions seen  in these areas on 12/02/2013 CT scan other than mild enthesopathy, which may explain the findings. There is a 4 mm sclerotic lesion left superior iliac crest which does correspond to an area of tracer uptake, but nonetheless likely represents a small bone island.   Electronically Signed   By: Skipper Cliche M.D.   On: 02/02/2014 13:41   Dg Chest Port 1 View  02/04/2014   CLINICAL DATA:  Port-A-Cath insertion  EXAM: PORTABLE CHEST - 1 VIEW  COMPARISON:  CT chest 12/31/2013  FINDINGS: Right-sided Port-A-Cath with the tip projecting over the SVC. There is no focal parenchymal opacity, pleural effusion, or pneumothorax. There is stable cardiomegaly.  The osseous structures are unremarkable.  IMPRESSION: Right-sided Port-A-Cath with the tip projecting over the SVC. No pneumothorax.   Electronically Signed   By: Kathreen Devoid   On: 02/04/2014 13:59   Dg C-arm 1-60 Min-no Report  02/09/2014   : Fluoroscopy was utilized by the requesting physician. No radiographic interpretation.    Electronically Signed   By: Porfirio Mylar   On: 02/09/2014 14:55    ASSESSMENT:  #1. Stage III-be adenocarcinoma the proximal transverse colon, status post colectomy on 12/29/2013, good tolerance of initial adjuvant FOLFOX. #2. Blood loss anemia, rule out iron deficiency. #3. Dysuria and urgency, possible urinary tract infection. #4. Mass in the pancreas, asymptomatic.   PLAN:  #1. Urinalysis with urine culture, clean catch. Infection is found, antibiotics will be started. #2. Continue Cystex injury copious amounts of fluids. Empirical Cipro 250 mg daily for 5 days. #3. Serum ferritin today. If low, IV iron will be given. If normal, iron will be discontinued and the patient will be allowed to normalize her hemoglobin without iron supplements. #4. Followup on 02/24/2014 for cycle 2 of a planned 12.   All questions were answered. The patient knows to call the clinic with any problems, questions or concerns. We can certainly see the patient much sooner if necessary.   I spent 30 minutes counseling the patient face to face. The total time spent in the appointment was 40 minutes.    Doroteo Bradford, MD 02/17/2014 3:04 PM  DISCLAIMER:  This note was dictated with voice recognition software.  Similar sounding words can inadvertently be transcribed inaccurately and may not be corrected upon review.

## 2014-02-17 NOTE — Patient Instructions (Addendum)
  [facility] Discharge Instructions  RECOMMENDATIONS MADE BY THE CONSULTANT AND ANY TEST RESULTS WILL BE SENT TO YOUR REFERRING PHYSICIAN.  EXAM FINDINGS BY THE PHYSICIAN TODAY AND SIGNS OR SYMPTOMS TO REPORT TO CLINIC OR PRIMARY PHYSICIAN: Exam and findings as discussed by Dr. Barnet Glasgow.  Will check some labs and urinalysis today.  If there are any concerns we will contact you.  If your ferritin is normal we will stop the iron, if it is low we will get you scheduled for an iron infusion. Report fevers, chills, uncontrolled nausea, vomiting or other issues.  MEDICATIONS PRESCRIBED:  none  INSTRUCTIONS/FOLLOW-UP: Follow-up in 1 week.  Thank you for choosing [facility] to provide your oncology and hematology care.  To afford each patient quality time with our providers, please arrive at least 15 minutes before your scheduled appointment time.  With your help, our goal is to use those 15 minutes to complete the necessary work-up to ensure our physicians have the information they need to help with your evaluation and healthcare recommendations.    Effective January 1st, 2014, we ask that you re-schedule your appointment with our physicians should you arrive 10 or more minutes late for your appointment.  We strive to give you quality time with our providers, and arriving late affects you and other patients whose appointments are after yours.    Again, thank you for choosing [facility].  Our hope is that these requests will decrease the amount of time that you wait before being seen by our physicians.       _____________________________________________________________  Should you have questions after your visit to [facility], please contact our office at [phone] between the hours of 8:30 a.m. and 4:30 p.m.  Voicemails left after 4:30 p.m. will not be returned until the following business day.  For prescription refill requests, have  your pharmacy contact our office with your prescription refill request.    _______________________________________________________________  We hope that we have given you very good care.  You may receive a patient satisfaction survey in the mail, please complete it and return it as soon as possible.  We value your feedback!  _______________________________________________________________  Have you asked about our STAR program?  STAR stands for Survivorship Training and Rehabilitation, and this is a nationally recognized cancer care program that focuses on survivorship and rehabilitation.  Cancer and cancer treatments may cause problems, such as, pain, making you feel tired and keeping you from doing the things that you need or want to do. Cancer rehabilitation can help. Our goal is to reduce these troubling effects and help you have the best quality of life possible.  You may receive a survey from a nurse that asks questions about your current state of health.  Based on the survey results, all eligible patients will be referred to the [facility] program for an evaluation so we can better serve you!  A frequently asked questions sheet is available upon request.

## 2014-02-18 ENCOUNTER — Other Ambulatory Visit (HOSPITAL_COMMUNITY): Payer: Self-pay | Admitting: Hematology and Oncology

## 2014-02-18 DIAGNOSIS — E611 Iron deficiency: Secondary | ICD-10-CM

## 2014-02-18 LAB — URINE CULTURE: Colony Count: 40000

## 2014-02-18 LAB — FERRITIN: Ferritin: 63 ng/mL (ref 10–291)

## 2014-02-22 NOTE — Progress Notes (Addendum)
Glo Herring., MD [place] [address]  Adenocarcinoma of colon - Plan: Iron and TIBC, Ferritin, Soluble transferrin receptor  Urinary tract infection without hematuria, site unspecified - Plan: Urinalysis, Routine w reflex microscopic  Chemotherapy-induced diarrhea  Iron deficiency anemia secondary to blood loss (chronic) - Plan: ferumoxytol (FERAHEME) 510 mg in sodium chloride 0.9 % 100 mL IVPB, ferumoxytol (FERAHEME) 510 mg in sodium chloride 0.9 % 100 mL IVPB  CURRENT THERAPY: S/P cycle 1 of FOLFOX in the adjuvant setting.  INTERVAL HISTORY: Amanda Norris 68 y.o. 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     Adenocarcinoma of colon   12/09/2013 Initial Diagnosis 1. Colon, biopsy, proximal transverse mass INVASIVE ADENOCARCINOMA.   12/29/2013 Definitive Surgery Colon, segmental resection for tumor, right - INVASIVE COLORECTAL ADENOCARCINOMA, 7 CM, EXTENDING INTO PERICOLONIC CONNECTIVE TISSUE. - METASTATIC CARCINOMA IN 6 OF 33 LYMPH NODES (6/33).   02/10/2014 -  Adjuvant Chemotherapy FOLFOX x 12 cycles   I personally reviewed and went over laboratory results with the patient.  The results are noted within this dictation.  She reports that she recently completed Cipro for a UTI.  We will check a UA today to verify resolution of the UTI.  She notes diarrhea 8-10 days following chemotherapy completion that lasted 2 days.  She notes at least 6 loose stools per day during that time.    Oncologically, she otherwise denies any complaints and ROS questioning is negative.  Past Medical History  Diagnosis Date  . PONV (postoperative nausea and vomiting)   . Colon cancer   . Arthritis     wrist and thumbs    has Mass of pancreas; Adenocarcinoma of colon; and Iron deficiency on her problem list.     is allergic to sulfa  antibiotics.  Ms. Star does not currently have medications on file.  Past Surgical History  Procedure Laterality Date  . Herniated disc      [id number]  . Colonoscopy N/A 12/12/2013    Procedure: COLONOSCOPY;  Surgeon: Rogene Houston, MD;  Location: [facility];  Service: Endoscopy;  Laterality: N/A;  730  . Eus N/A 12/18/2013    Procedure: UPPER ENDOSCOPIC ULTRASOUND (EUS) LINEAR;  Surgeon: Milus Banister, MD;  Location: [facility];  Service: Endoscopy;  Laterality: N/A;  . Partial colectomy N/A 12/29/2013    Procedure: RIGHT HEMICOLECTOMY;  Surgeon: Jamesetta So, MD;  Location: [facility];  Service: General;  Laterality: N/A;  . Portacath placement Right 02/04/2014    Procedure: INSERTION PORT-A-CATH;  Surgeon: Jamesetta So, MD;  Location: [facility];  Service: General;  Laterality: Right;    Denies any headaches, dizziness, double vision, fevers, chills, night sweats, nausea, vomiting, diarrhea, constipation, chest pain, heart palpitations, shortness of breath, blood in stool, black tarry stool, urinary pain, urinary burning, urinary frequency, hematuria.   PHYSICAL EXAMINATION  ECOG PERFORMANCE STATUS: 0 - Asymptomatic  Filed Vitals:   02/24/14 0855  BP: 155/59  Pulse: 63  Temp: 98.2 F (36.8 C)  Resp: 18    GENERAL:alert, no distress, well nourished, well developed, comfortable, cooperative and smiling SKIN: skin color, texture, turgor are normal, no rashes or significant lesions HEAD: Normocephalic, No masses, lesions, tenderness or abnormalities EYES: normal, PERRLA, EOMI, Conjunctiva are pink and non-injected EARS: External ears normal OROPHARYNX:lips, buccal mucosa, and tongue normal and mucous membranes are moist  NECK:  supple, no adenopathy, thyroid normal size, non-tender, without nodularity, no stridor, non-tender, trachea midline LYMPH:  no palpable lymphadenopathy, no hepatosplenomegaly BREAST:not examined LUNGS: clear to auscultation  HEART: regular rate &  rhythm, no murmurs, no gallops, S1 normal and S2 normal ABDOMEN:abdomen soft, non-tender, obese, normal bowel sounds, no masses or organomegaly and no hepatosplenomegaly BACK: Back symmetric, no curvature. EXTREMITIES:less then 2 second capillary refill, no joint deformities, effusion, or inflammation, no edema, no skin discoloration, no clubbing, no cyanosis  NEURO: alert & oriented x 3 with fluent speech, no focal motor/sensory deficits, gait normal   LABORATORY DATA: CBC    Component Value Date/Time   WBC 4.8 02/24/2014 0859   RBC 4.43 02/24/2014 0859   HGB 11.9* 02/24/2014 0859   HCT 36.3 02/24/2014 0859   PLT 169 02/24/2014 0859   MCV 81.9 02/24/2014 0859   MCH 26.9 02/24/2014 0859   MCHC 32.8 02/24/2014 0859   RDW 16.8* 02/24/2014 0859   LYMPHSABS 1.1 02/24/2014 0859   MONOABS 0.6 02/24/2014 0859   EOSABS 0.1 02/24/2014 0859   BASOSABS 0.0 02/24/2014 0859      Chemistry      Component Value Date/Time   NA 142 02/24/2014 0859   K 3.8 02/24/2014 0859   CL 106 02/24/2014 0859   CO2 25 02/24/2014 0859   BUN 9 02/24/2014 0859   CREATININE 0.62 02/24/2014 0859      Component Value Date/Time   CALCIUM 8.9 02/24/2014 0859   ALKPHOS 66 02/24/2014 0859   AST 21 02/24/2014 0859   ALT 12 02/24/2014 0859   BILITOT 0.2* 02/24/2014 0859     Lab Results  Component Value Date   FERRITIN 63 02/17/2014      ASSESSMENT:  1. Stage III-be adenocarcinoma the proximal transverse colon, status post colectomy on 12/29/2013, good tolerance of initial adjuvant FOLFOX.  2. Iron deficiency anemia, secondary to blood loss.  3. Mass in the pancreas, asymptomatic. 4. 5FU-induced diarrhea  Patient Active Problem List   Diagnosis Date Noted  . Iron deficiency 02/18/2014  . Adenocarcinoma of colon 12/29/2013  . Mass of pancreas 12/08/2013     PLAN:  1. I personally reviewed and went over laboratory results with the patient.  The results are noted within this dictation. 2. Pre-chemo labs as planned. 3.  Iron studies on 9/15: iron/TIBC, ferritin, soluble transferrin. 4. UA today. 5. Reduce dose of 5FU CI by 20% 6. IV Feraheme 510 mg on 8/20 and 8/27 7. Return in 2 weeks for follow-up   THERAPY PLAN:  We will continue with adjuvant FOLFOX chemotherapy to reduce the patient's risk of recurrence/relapse.    All questions were answered. The patient knows to call the clinic with any problems, questions or concerns. We can certainly see the patient much sooner if necessary.  Patient and plan discussed with Dr. Farrel Gobble and he is in agreement with the aforementioned.   Frutoso Dimare 02/24/2014    ADDENDUM:  Will test for DPD deficiency with her next chemotherapy day.  Phyllis Whitefield 02/25/2014

## 2014-02-24 ENCOUNTER — Encounter (HOSPITAL_BASED_OUTPATIENT_CLINIC_OR_DEPARTMENT_OTHER): Payer: Medicare Other | Admitting: Oncology

## 2014-02-24 ENCOUNTER — Telehealth (HOSPITAL_COMMUNITY): Payer: Self-pay | Admitting: Emergency Medicine

## 2014-02-24 ENCOUNTER — Encounter (HOSPITAL_BASED_OUTPATIENT_CLINIC_OR_DEPARTMENT_OTHER): Payer: Medicare Other

## 2014-02-24 ENCOUNTER — Encounter (HOSPITAL_COMMUNITY): Payer: Self-pay | Admitting: Oncology

## 2014-02-24 ENCOUNTER — Encounter (HOSPITAL_COMMUNITY): Payer: Self-pay | Admitting: Hematology and Oncology

## 2014-02-24 VITALS — BP 155/59 | HR 63 | Temp 98.2°F | Resp 18 | Wt 158.0 lb

## 2014-02-24 DIAGNOSIS — K521 Toxic gastroenteritis and colitis: Secondary | ICD-10-CM

## 2014-02-24 DIAGNOSIS — Z5111 Encounter for antineoplastic chemotherapy: Secondary | ICD-10-CM | POA: Diagnosis not present

## 2014-02-24 DIAGNOSIS — C189 Malignant neoplasm of colon, unspecified: Secondary | ICD-10-CM | POA: Diagnosis not present

## 2014-02-24 DIAGNOSIS — N390 Urinary tract infection, site not specified: Secondary | ICD-10-CM | POA: Diagnosis not present

## 2014-02-24 DIAGNOSIS — R197 Diarrhea, unspecified: Secondary | ICD-10-CM | POA: Diagnosis not present

## 2014-02-24 DIAGNOSIS — D500 Iron deficiency anemia secondary to blood loss (chronic): Secondary | ICD-10-CM | POA: Diagnosis not present

## 2014-02-24 DIAGNOSIS — C184 Malignant neoplasm of transverse colon: Secondary | ICD-10-CM

## 2014-02-24 DIAGNOSIS — T451X5A Adverse effect of antineoplastic and immunosuppressive drugs, initial encounter: Secondary | ICD-10-CM

## 2014-02-24 LAB — CBC WITH DIFFERENTIAL/PLATELET
BASOS PCT: 0 % (ref 0–1)
Basophils Absolute: 0 10*3/uL (ref 0.0–0.1)
Eosinophils Absolute: 0.1 10*3/uL (ref 0.0–0.7)
Eosinophils Relative: 2 % (ref 0–5)
HEMATOCRIT: 36.3 % (ref 36.0–46.0)
HEMOGLOBIN: 11.9 g/dL — AB (ref 12.0–15.0)
LYMPHS ABS: 1.1 10*3/uL (ref 0.7–4.0)
LYMPHS PCT: 23 % (ref 12–46)
MCH: 26.9 pg (ref 26.0–34.0)
MCHC: 32.8 g/dL (ref 30.0–36.0)
MCV: 81.9 fL (ref 78.0–100.0)
MONO ABS: 0.6 10*3/uL (ref 0.1–1.0)
MONOS PCT: 13 % — AB (ref 3–12)
Neutro Abs: 2.9 10*3/uL (ref 1.7–7.7)
Neutrophils Relative %: 62 % (ref 43–77)
Platelets: 169 10*3/uL (ref 150–400)
RBC: 4.43 MIL/uL (ref 3.87–5.11)
RDW: 16.8 % — ABNORMAL HIGH (ref 11.5–15.5)
WBC: 4.8 10*3/uL (ref 4.0–10.5)

## 2014-02-24 LAB — URINALYSIS, ROUTINE W REFLEX MICROSCOPIC
BILIRUBIN URINE: NEGATIVE
GLUCOSE, UA: NEGATIVE mg/dL
Hgb urine dipstick: NEGATIVE
Ketones, ur: NEGATIVE mg/dL
Leukocytes, UA: NEGATIVE
Nitrite: NEGATIVE
PH: 7 (ref 5.0–8.0)
Protein, ur: NEGATIVE mg/dL
Specific Gravity, Urine: <1.005 — ABNORMAL LOW (ref 1.005–1.030)
Urobilinogen, UA: 0.2 mg/dL (ref 0.0–1.0)

## 2014-02-24 LAB — COMPREHENSIVE METABOLIC PANEL
ALK PHOS: 66 U/L (ref 39–117)
ALT: 12 U/L (ref 0–35)
ANION GAP: 11 (ref 5–15)
AST: 21 U/L (ref 0–37)
Albumin: 3.6 g/dL (ref 3.5–5.2)
BILIRUBIN TOTAL: 0.2 mg/dL — AB (ref 0.3–1.2)
BUN: 9 mg/dL (ref 6–23)
CHLORIDE: 106 meq/L (ref 96–112)
CO2: 25 meq/L (ref 19–32)
CREATININE: 0.62 mg/dL (ref 0.50–1.10)
Calcium: 8.9 mg/dL (ref 8.4–10.5)
GFR calc Af Amer: >90 mL/min (ref >90)
GFR calc non Af Amer: >90 mL/min (ref >90)
Glucose, Bld: 112 mg/dL — ABNORMAL HIGH (ref 70–99)
POTASSIUM: 3.8 meq/L (ref 3.7–5.3)
Sodium: 142 mEq/L (ref 137–147)
Total Protein: 7.1 g/dL (ref 6.0–8.3)

## 2014-02-24 MED ORDER — DEXAMETHASONE SODIUM PHOSPHATE 10 MG/ML IJ SOLN: 10.0000 mg | Freq: Once | INTRAMUSCULAR | Status: DC

## 2014-02-24 MED ORDER — DEXTROSE 5 % IV SOLN
Freq: Once | INTRAVENOUS | Status: AC
  Administered 2014-02-24: 09:00:00 via INTRAVENOUS

## 2014-02-24 MED ORDER — SODIUM CHLORIDE 0.9 % IV SOLN
1920.0000 mg/m2 | INTRAVENOUS | Status: DC
  Administered 2014-02-24: 3400 mg via INTRAVENOUS
  Filled 2014-02-24: qty 68

## 2014-02-24 MED ORDER — FLUOROURACIL CHEMO INJECTION 2.5 GM/50ML
400.0000 mg/m2 | Freq: Once | INTRAVENOUS | Status: AC
  Administered 2014-02-24: 700 mg via INTRAVENOUS
  Filled 2014-02-24: qty 14

## 2014-02-24 MED ORDER — SODIUM CHLORIDE 0.9 % IV SOLN: 8.0000 mg | Freq: Once | INTRAVENOUS | Status: DC

## 2014-02-24 MED ORDER — HEPARIN SOD (PORK) LOCK FLUSH 100 UNIT/ML IV SOLN: 500.0000 [IU] | Freq: Once | INTRAVENOUS | Status: DC | PRN

## 2014-02-24 MED ORDER — OXALIPLATIN CHEMO INJECTION 100 MG/20ML
85.0000 mg/m2 | Freq: Once | INTRAVENOUS | Status: AC
  Administered 2014-02-24: 150 mg via INTRAVENOUS
  Filled 2014-02-24: qty 30

## 2014-02-24 MED ORDER — DEXTROSE 5 % IV SOLN
400.0000 mg/m2 | Freq: Once | INTRAVENOUS | Status: AC
  Administered 2014-02-24: 704 mg via INTRAVENOUS
  Filled 2014-02-24: qty 35.2

## 2014-02-24 MED ORDER — SODIUM CHLORIDE 0.9 % IJ SOLN: 10.0000 mL | INTRAMUSCULAR | Status: DC | PRN

## 2014-02-24 MED ORDER — SODIUM CHLORIDE 0.9 % IV SOLN
Freq: Once | INTRAVENOUS | Status: AC
  Administered 2014-02-24: 8 mg via INTRAVENOUS
  Filled 2014-02-24: qty 4

## 2014-02-24 NOTE — Patient Instructions (Signed)
[facility] Discharge Instructions  RECOMMENDATIONS MADE BY THE CONSULTANT AND ANY TEST RESULTS WILL BE SENT TO YOUR REFERRING PHYSICIAN.  EXAM FINDINGS BY THE PHYSICIAN TODAY AND SIGNS OR SYMPTOMS TO REPORT TO CLINIC OR PRIMARY PHYSICIAN: You saw Amanda Norris today  Follow up in 2 weeks to see doctor as scheduled.  We will re-check your urine today   Thank you for choosing [facility] to provide your oncology and hematology care.  To afford each patient quality time with our providers, please arrive at least 15 minutes before your scheduled appointment time.  With your help, our goal is to use those 15 minutes to complete the necessary work-up to ensure our physicians have the information they need to help with your evaluation and healthcare recommendations.    Effective January 1st, 2014, we ask that you re-schedule your appointment with our physicians should you arrive 10 or more minutes late for your appointment.  We strive to give you quality time with our providers, and arriving late affects you and other patients whose appointments are after yours.    Again, thank you for choosing [facility].  Our hope is that these requests will decrease the amount of time that you wait before being seen by our physicians.       _____________________________________________________________  Should you have questions after your visit to [facility], please contact our office at [phone] between the hours of 8:30 a.m. and 5:00 p.m.  Voicemails left after 4:30 p.m. will not be returned until the following business day.  For prescription refill requests, have your pharmacy contact our office with your prescription refill request.

## 2014-02-24 NOTE — Telephone Encounter (Signed)
Telephone call  

## 2014-02-25 NOTE — Addendum Note (Signed)
Addended by: Baird Cancer on: 02/25/2014 12:01 PM   Modules accepted: Orders

## 2014-02-26 ENCOUNTER — Encounter (HOSPITAL_BASED_OUTPATIENT_CLINIC_OR_DEPARTMENT_OTHER): Payer: Medicare Other

## 2014-02-26 ENCOUNTER — Encounter (HOSPITAL_COMMUNITY): Payer: Medicare Other

## 2014-02-26 VITALS — BP 111/83 | HR 60 | Temp 97.9°F | Resp 16

## 2014-02-26 DIAGNOSIS — D500 Iron deficiency anemia secondary to blood loss (chronic): Secondary | ICD-10-CM | POA: Diagnosis not present

## 2014-02-26 DIAGNOSIS — C189 Malignant neoplasm of colon, unspecified: Secondary | ICD-10-CM

## 2014-02-26 DIAGNOSIS — E611 Iron deficiency: Secondary | ICD-10-CM

## 2014-02-26 MED ORDER — SODIUM CHLORIDE 0.9 % IJ SOLN: 10.0000 mL | INTRAMUSCULAR | Status: DC | PRN

## 2014-02-26 MED ORDER — SODIUM CHLORIDE 0.9 % IJ SOLN
10.0000 mL | INTRAMUSCULAR | Status: DC | PRN
  Administered 2014-02-26: 10 mL

## 2014-02-26 MED ORDER — SODIUM CHLORIDE 0.9 % IV SOLN
Freq: Once | INTRAVENOUS | Status: AC
  Administered 2014-02-26: 13:00:00 via INTRAVENOUS

## 2014-02-26 MED ORDER — HEPARIN SOD (PORK) LOCK FLUSH 100 UNIT/ML IV SOLN: 250.0000 [IU] | Freq: Once | INTRAVENOUS | Status: DC | PRN

## 2014-02-26 MED ORDER — ALTEPLASE 2 MG IJ SOLR: 2.0000 mg | Freq: Once | INTRAMUSCULAR | Status: DC | PRN

## 2014-02-26 MED ORDER — SODIUM CHLORIDE 0.9 % IV SOLN
510.0000 mg | Freq: Once | INTRAVENOUS | Status: AC
  Administered 2014-02-26: 510 mg via INTRAVENOUS
  Filled 2014-02-26: qty 17

## 2014-02-26 MED ORDER — HEPARIN SOD (PORK) LOCK FLUSH 100 UNIT/ML IV SOLN
500.0000 [IU] | Freq: Once | INTRAVENOUS | Status: AC | PRN
  Administered 2014-02-26: 500 [IU]

## 2014-02-26 MED ORDER — HEPARIN SOD (PORK) LOCK FLUSH 100 UNIT/ML IV SOLN
INTRAVENOUS | Status: AC
  Filled 2014-02-26: qty 5

## 2014-02-26 MED ORDER — SODIUM CHLORIDE 0.9 % IJ SOLN: 3.0000 mL | INTRAMUSCULAR | Status: DC | PRN

## 2014-02-26 MED ORDER — HEPARIN SOD (PORK) LOCK FLUSH 100 UNIT/ML IV SOLN: 500.0000 [IU] | Freq: Once | INTRAVENOUS | Status: DC | PRN

## 2014-02-26 MED ORDER — COLD PACK MISC ONCOLOGY: 1.0000 | Freq: Once | Status: DC | PRN

## 2014-02-26 NOTE — Progress Notes (Signed)
D/C continuous infusion pump. Patient denies complaints with chemotherapy. She reports she did have some tingling in her left arm and some mild cold sensitivity with fluids. Tolerated iron infusion well.

## 2014-03-05 ENCOUNTER — Encounter (HOSPITAL_BASED_OUTPATIENT_CLINIC_OR_DEPARTMENT_OTHER): Payer: Medicare Other

## 2014-03-05 VITALS — BP 123/59 | HR 57 | Temp 97.5°F | Resp 18

## 2014-03-05 DIAGNOSIS — E611 Iron deficiency: Secondary | ICD-10-CM

## 2014-03-05 DIAGNOSIS — D500 Iron deficiency anemia secondary to blood loss (chronic): Secondary | ICD-10-CM | POA: Diagnosis not present

## 2014-03-05 DIAGNOSIS — Z95828 Presence of other vascular implants and grafts: Secondary | ICD-10-CM

## 2014-03-05 MED ORDER — SODIUM CHLORIDE 0.9 % IV SOLN
510.0000 mg | Freq: Once | INTRAVENOUS | Status: AC
  Administered 2014-03-05: 510 mg via INTRAVENOUS
  Filled 2014-03-05: qty 17

## 2014-03-05 MED ORDER — SODIUM CHLORIDE 0.9 % IJ SOLN
10.0000 mL | INTRAMUSCULAR | Status: DC | PRN
  Administered 2014-03-05: 10 mL via INTRAVENOUS

## 2014-03-05 MED ORDER — SODIUM CHLORIDE 0.9 % IV SOLN
INTRAVENOUS | Status: DC
  Administered 2014-03-05: 12:00:00 via INTRAVENOUS

## 2014-03-05 MED ORDER — HEPARIN SOD (PORK) LOCK FLUSH 100 UNIT/ML IV SOLN
500.0000 [IU] | Freq: Once | INTRAVENOUS | Status: AC
  Administered 2014-03-05: 500 [IU] via INTRAVENOUS

## 2014-03-05 MED ORDER — HEPARIN SOD (PORK) LOCK FLUSH 100 UNIT/ML IV SOLN
INTRAVENOUS | Status: AC
  Filled 2014-03-05: qty 5

## 2014-03-10 ENCOUNTER — Encounter (HOSPITAL_COMMUNITY): Payer: Self-pay | Admitting: *Deleted

## 2014-03-10 ENCOUNTER — Encounter (HOSPITAL_COMMUNITY): Payer: Medicare Other | Attending: Hematology and Oncology

## 2014-03-10 ENCOUNTER — Encounter (HOSPITAL_BASED_OUTPATIENT_CLINIC_OR_DEPARTMENT_OTHER): Payer: Medicare Other

## 2014-03-10 ENCOUNTER — Encounter (HOSPITAL_COMMUNITY): Payer: Self-pay

## 2014-03-10 VITALS — BP 143/75 | HR 88 | Temp 98.3°F | Resp 16 | Wt 154.1 lb

## 2014-03-10 DIAGNOSIS — R918 Other nonspecific abnormal finding of lung field: Secondary | ICD-10-CM | POA: Insufficient documentation

## 2014-03-10 DIAGNOSIS — C189 Malignant neoplasm of colon, unspecified: Secondary | ICD-10-CM

## 2014-03-10 DIAGNOSIS — Z9049 Acquired absence of other specified parts of digestive tract: Secondary | ICD-10-CM | POA: Diagnosis not present

## 2014-03-10 DIAGNOSIS — D500 Iron deficiency anemia secondary to blood loss (chronic): Secondary | ICD-10-CM | POA: Diagnosis not present

## 2014-03-10 DIAGNOSIS — C184 Malignant neoplasm of transverse colon: Secondary | ICD-10-CM

## 2014-03-10 DIAGNOSIS — K869 Disease of pancreas, unspecified: Secondary | ICD-10-CM | POA: Diagnosis not present

## 2014-03-10 DIAGNOSIS — Z5111 Encounter for antineoplastic chemotherapy: Secondary | ICD-10-CM | POA: Diagnosis not present

## 2014-03-10 DIAGNOSIS — D649 Anemia, unspecified: Secondary | ICD-10-CM | POA: Diagnosis not present

## 2014-03-10 DIAGNOSIS — K521 Toxic gastroenteritis and colitis: Secondary | ICD-10-CM

## 2014-03-10 DIAGNOSIS — T451X5A Adverse effect of antineoplastic and immunosuppressive drugs, initial encounter: Secondary | ICD-10-CM

## 2014-03-10 LAB — CBC WITH DIFFERENTIAL/PLATELET
Basophils Absolute: 0 10*3/uL (ref 0.0–0.1)
Basophils Relative: 0 % (ref 0–1)
Eosinophils Absolute: 0.1 10*3/uL (ref 0.0–0.7)
Eosinophils Relative: 1 % (ref 0–5)
HCT: 37.4 % (ref 36.0–46.0)
Hemoglobin: 12.4 g/dL (ref 12.0–15.0)
Lymphocytes Relative: 21 % (ref 12–46)
Lymphs Abs: 1.1 10*3/uL (ref 0.7–4.0)
MCH: 27.6 pg (ref 26.0–34.0)
MCHC: 33.2 g/dL (ref 30.0–36.0)
MCV: 83.3 fL (ref 78.0–100.0)
Monocytes Absolute: 0.9 10*3/uL (ref 0.1–1.0)
Monocytes Relative: 16 % — ABNORMAL HIGH (ref 3–12)
Neutro Abs: 3.4 10*3/uL (ref 1.7–7.7)
Neutrophils Relative %: 62 % (ref 43–77)
Platelets: 114 10*3/uL — ABNORMAL LOW (ref 150–400)
RBC: 4.49 MIL/uL (ref 3.87–5.11)
RDW: 18.2 % — ABNORMAL HIGH (ref 11.5–15.5)
WBC: 5.5 10*3/uL (ref 4.0–10.5)

## 2014-03-10 LAB — COMPREHENSIVE METABOLIC PANEL
ALT: 36 U/L — ABNORMAL HIGH (ref 0–35)
AST: 37 U/L (ref 0–37)
Albumin: 3.7 g/dL (ref 3.5–5.2)
Alkaline Phosphatase: 76 U/L (ref 39–117)
Anion gap: 8 (ref 5–15)
BUN: 13 mg/dL (ref 6–23)
CO2: 27 mEq/L (ref 19–32)
Calcium: 9.1 mg/dL (ref 8.4–10.5)
Chloride: 105 mEq/L (ref 96–112)
Creatinine, Ser: 0.67 mg/dL (ref 0.50–1.10)
GFR calc Af Amer: >90 mL/min (ref >90)
GFR calc non Af Amer: 89 mL/min — ABNORMAL LOW (ref >90)
Glucose, Bld: 108 mg/dL — ABNORMAL HIGH (ref 70–99)
Potassium: 3.9 mEq/L (ref 3.7–5.3)
Sodium: 140 mEq/L (ref 137–147)
Total Bilirubin: 0.2 mg/dL — ABNORMAL LOW (ref 0.3–1.2)
Total Protein: 6.7 g/dL (ref 6.0–8.3)

## 2014-03-10 LAB — CEA: CEA: 4.4 ng/mL (ref 0.0–5.0)

## 2014-03-10 MED ORDER — SODIUM CHLORIDE 0.9 % IV SOLN: 8.0000 mg | Freq: Once | INTRAVENOUS | Status: DC

## 2014-03-10 MED ORDER — SODIUM CHLORIDE 0.9 % IJ SOLN
10.0000 mL | INTRAMUSCULAR | Status: DC | PRN
  Administered 2014-03-10: 10 mL

## 2014-03-10 MED ORDER — DEXAMETHASONE SODIUM PHOSPHATE 10 MG/ML IJ SOLN: 10.0000 mg | Freq: Once | INTRAMUSCULAR | Status: DC

## 2014-03-10 MED ORDER — SODIUM CHLORIDE 0.9 % IV SOLN
1920.0000 mg/m2 | INTRAVENOUS | Status: DC
  Administered 2014-03-10: 3400 mg via INTRAVENOUS
  Filled 2014-03-10: qty 68

## 2014-03-10 MED ORDER — DEXTROSE 5 % IV SOLN
Freq: Once | INTRAVENOUS | Status: AC
  Administered 2014-03-10: 10:00:00 via INTRAVENOUS

## 2014-03-10 MED ORDER — LEUCOVORIN CALCIUM INJECTION 350 MG
400.0000 mg/m2 | Freq: Once | INTRAVENOUS | Status: AC
  Administered 2014-03-10: 704 mg via INTRAVENOUS
  Filled 2014-03-10: qty 35.2

## 2014-03-10 MED ORDER — ONDANSETRON HCL 40 MG/20ML IJ SOLN
Freq: Once | INTRAMUSCULAR | Status: AC
  Administered 2014-03-10: 8 mg via INTRAVENOUS
  Filled 2014-03-10: qty 4

## 2014-03-10 MED ORDER — FLUOROURACIL CHEMO INJECTION 2.5 GM/50ML
400.0000 mg/m2 | Freq: Once | INTRAVENOUS | Status: AC
  Administered 2014-03-10: 700 mg via INTRAVENOUS
  Filled 2014-03-10: qty 14

## 2014-03-10 MED ORDER — OXALIPLATIN CHEMO INJECTION 100 MG/20ML
85.0000 mg/m2 | Freq: Once | INTRAVENOUS | Status: AC
  Administered 2014-03-10: 150 mg via INTRAVENOUS
  Filled 2014-03-10: qty 30

## 2014-03-10 NOTE — Patient Instructions (Signed)
[facility] Discharge Instructions for Patients Receiving Chemotherapy  Today you received the following chemotherapy agents Oxaliplatin, Leucovorin, 5FU. Return to clinic as scheduled. Lab work in 1 week. To help prevent nausea and vomiting after your treatment, we encourage you to take your nausea medication as instructed. If you develop nausea and vomiting that is not controlled by your nausea medication, call the clinic. If it is after clinic hours your family physician or the after hours number for the clinic or go to the Emergency Department.   BELOW ARE SYMPTOMS THAT SHOULD BE REPORTED IMMEDIATELY:  *FEVER GREATER THAN 101.0 F  *CHILLS WITH OR WITHOUT FEVER  NAUSEA AND VOMITING THAT IS NOT CONTROLLED WITH YOUR NAUSEA MEDICATION  *UNUSUAL SHORTNESS OF BREATH  *UNUSUAL BRUISING OR BLEEDING  TENDERNESS IN MOUTH AND THROAT WITH OR WITHOUT PRESENCE OF ULCERS  *URINARY PROBLEMS  *BOWEL PROBLEMS  UNUSUAL RASH Items with * indicate a potential emergency and should be followed up as soon as possible.  I have been informed and understand all the instructions given to me. I know to contact the clinic, my physician, or go to the Emergency Department if any problems should occur. I do not have any questions at this time, but understand that I may call the clinic during office hours or the Patient Navigator at [phone] should I have any questions or need assistance in obtaining follow up care.    __________________________________________  _____________  __________ Signature of Patient or Authorized Representative            Date                   Time    __________________________________________ Nurse's Signature

## 2014-03-10 NOTE — Progress Notes (Signed)
[facility]  OFFICE PROGRESS NOTE  Glo Herring., MD [place] [address]  DIAGNOSIS: Adenocarcinoma of colon  Iron deficiency anemia secondary to blood loss (chronic)  Chief Complaint  Patient presents with  . Transverse colon stage III colon cancer    CURRENT THERAPY: FOLFOX adjuvant chemotherapy, cycle 2 due today with 20% decrease in 5-fluorouracil infusion dose due to diarrhea.  IV Feraheme on 02/26/2014 and 03/05/2014.  INTERVAL HISTORY: Amanda Norris 68 y.o. 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  She feels after receiving intravenous iron. Appetite is good with minimal loose stools. She denies any peripheral paresthesias, sore throat, melena, hematochezia, hematuria, epistaxis, or mop this is. She denies any cough, wheezing, PND, orthopnea, palpitations, headache, or seizures.   MEDICAL HISTORY: Past Medical History  Diagnosis Date  . PONV (postoperative nausea and vomiting)   . Colon cancer   . Arthritis     wrist and thumbs    INTERIM HISTORY: has Mass of pancreas; Adenocarcinoma of colon; and Iron deficiency on her problem list.   Adenocarcinoma of colon  12/09/2013  Initial Diagnosis  1. Colon, biopsy, proximal transverse mass INVASIVE ADENOCARCINOMA.  12/29/2013  Definitive Surgery  Colon, segmental resection for tumor, right - INVASIVE COLORECTAL ADENOCARCINOMA, 7 CM, EXTENDING INTO PERICOLONIC CONNECTIVE TISSUE. - METASTATIC CARCINOMA IN 6 OF 33 LYMPH NODES (6/33).  02/10/2014 -  Adjuvant Chemotherapy  FOLFOX x 12 cycles   ALLERGIES:  is allergic to sulfa antibiotics.  MEDICATIONS: has a current medication list which includes the following prescription(s): bioflavonoid products, black cohosh, ciprofloxacin, cranberry, dextrose 5 % SOLN 1,000 mL with  fluorouracil 5 GM/100ML SOLN, ferrous sulfate, garlic, hydrocodone-acetaminophen, ibuprofen, lactobacillus, leucovorin calcium, lidocaine-prilocaine, multivitamin, fish oil, ondansetron, oxaliplatin, and prochlorperazine, and the following Facility-Administered Medications: fluorouracil (ADRUCIL) 3,400 mg in sodium chloride 0.9 % 150 mL chemo infusion, fluorouracil, leucovorin 704 mg in dextrose 5 % 250 mL infusion, oxaliplatin (ELOXATIN) 150 mg in dextrose 5 % 500 mL chemo infusion, and sodium chloride.  SURGICAL HISTORY:  Past Surgical History  Procedure Laterality Date  . Herniated disc      [id number]  . Colonoscopy N/A 12/12/2013    Procedure: COLONOSCOPY;  Surgeon: Rogene Houston, MD;  Location: [facility];  Service: Endoscopy;  Laterality: N/A;  730  . Eus N/A 12/18/2013    Procedure: UPPER ENDOSCOPIC ULTRASOUND (EUS) LINEAR;  Surgeon: Milus Banister, MD;  Location: [facility];  Service: Endoscopy;  Laterality: N/A;  . Partial colectomy N/A 12/29/2013    Procedure: RIGHT HEMICOLECTOMY;  Surgeon: Jamesetta So, MD;  Location: [facility];  Service: General;  Laterality: N/A;  . Portacath placement Right 02/04/2014    Procedure: INSERTION PORT-A-CATH;  Surgeon: Jamesetta So, MD;  Location: [facility];  Service: General;  Laterality: Right;    FAMILY HISTORY: family history is not on file.  SOCIAL HISTORY:  reports that she quit smoking about 54 years ago. Her smoking use included Cigarettes. She has a 1.25 pack-year smoking history. She has never used smokeless tobacco. She reports that she does not drink alcohol or use illicit drugs.  REVIEW OF SYSTEMS:  Other than that discussed above is noncontributory.  PHYSICAL EXAMINATION: ECOG PERFORMANCE STATUS: 1 - Symptomatic but completely ambulatory  There were no vitals taken for this visit.  GENERAL:alert, no distress and comfortable SKIN: skin  color, texture, turgor are normal, no rashes or significant lesions EYES: PERLA; Conjunctiva  are pink and non-injected, sclera clear SINUSES: No redness or tenderness over maxillary or ethmoid sinuses OROPHARYNX:no exudate, no erythema on lips, buccal mucosa, or tongue. NECK: supple, thyroid normal size, non-tender, without nodularity. No masses CHEST: LifePort in place with no breast masses. LYMPH:  no palpable lymphadenopathy in the cervical, axillary or inguinal LUNGS: clear to auscultation and percussion with normal breathing effort HEART: regular rate & rhythm and no murmurs. ABDOMEN:abdomen soft, non-tender and normal bowel sounds. No organomegaly, ascites, or CVA tenderness. MUSCULOSKELETAL:no cyanosis of digits and no clubbing. Range of motion normal.  NEURO: alert & oriented x 3 with fluent speech, no focal motor/sensory deficits   LABORATORY DATA: Infusion on 03/10/2014  Component Date Value Ref Range Status  . WBC 03/10/2014 5.5  4.0 - 10.5 K/uL Final  . RBC 03/10/2014 4.49  3.87 - 5.11 MIL/uL Final  . Hemoglobin 03/10/2014 12.4  12.0 - 15.0 g/dL Final  . HCT 03/10/2014 37.4  36.0 - 46.0 % Final  . MCV 03/10/2014 83.3  78.0 - 100.0 fL Final  . MCH 03/10/2014 27.6  26.0 - 34.0 pg Final  . MCHC 03/10/2014 33.2  30.0 - 36.0 g/dL Final  . RDW 03/10/2014 18.2* 11.5 - 15.5 % Final  . Platelets 03/10/2014 114* 150 - 400 K/uL Final   Comment: SPECIMEN CHECKED FOR CLOTS                          PLATELET COUNT CONFIRMED BY SMEAR  . Neutrophils Relative % 03/10/2014 62  43 - 77 % Final  . Neutro Abs 03/10/2014 3.4  1.7 - 7.7 K/uL Final  . Lymphocytes Relative 03/10/2014 21  12 - 46 % Final  . Lymphs Abs 03/10/2014 1.1  0.7 - 4.0 K/uL Final  . Monocytes Relative 03/10/2014 16* 3 - 12 % Final  . Monocytes Absolute 03/10/2014 0.9  0.1 - 1.0 K/uL Final  . Eosinophils Relative 03/10/2014 1  0 - 5 % Final  . Eosinophils Absolute 03/10/2014 0.1  0.0 - 0.7 K/uL Final  . Basophils Relative 03/10/2014 0  0 - 1 % Final  . Basophils Absolute 03/10/2014 0.0  0.0 - 0.1 K/uL Final  .  Sodium 03/10/2014 140  137 - 147 mEq/L Final  . Potassium 03/10/2014 3.9  3.7 - 5.3 mEq/L Final  . Chloride 03/10/2014 105  96 - 112 mEq/L Final  . CO2 03/10/2014 27  19 - 32 mEq/L Final  . Glucose, Bld 03/10/2014 108* 70 - 99 mg/dL Final  . BUN 03/10/2014 13  6 - 23 mg/dL Final  . Creatinine, Ser 03/10/2014 0.67  0.50 - 1.10 mg/dL Final  . Calcium 03/10/2014 9.1  8.4 - 10.5 mg/dL Final  . Total Protein 03/10/2014 6.7  6.0 - 8.3 g/dL Final  . Albumin 03/10/2014 3.7  3.5 - 5.2 g/dL Final  . AST 03/10/2014 37  0 - 37 U/L Final  . ALT 03/10/2014 36* 0 - 35 U/L Final  . Alkaline Phosphatase 03/10/2014 76  39 - 117 U/L Final  . Total Bilirubin 03/10/2014 0.2* 0.3 - 1.2 mg/dL Final  . GFR calc non Af Amer 03/10/2014 89* >90 mL/min Final  . GFR calc Af Amer 03/10/2014 >90  >90 mL/min Final   Comment: (NOTE)  The eGFR has been calculated using the CKD EPI equation.                          This calculation has not been validated in all clinical situations.                          eGFR's persistently <90 mL/min signify possible Chronic Kidney                          Disease.  . Anion gap 03/10/2014 8  5 - 15 Final  Infusion on 02/24/2014  Component Date Value Ref Range Status  . WBC 02/24/2014 4.8  4.0 - 10.5 K/uL Final  . RBC 02/24/2014 4.43  3.87 - 5.11 MIL/uL Final  . Hemoglobin 02/24/2014 11.9* 12.0 - 15.0 g/dL Final  . HCT 02/24/2014 36.3  36.0 - 46.0 % Final  . MCV 02/24/2014 81.9  78.0 - 100.0 fL Final  . MCH 02/24/2014 26.9  26.0 - 34.0 pg Final  . MCHC 02/24/2014 32.8  30.0 - 36.0 g/dL Final  . RDW 02/24/2014 16.8* 11.5 - 15.5 % Final  . Platelets 02/24/2014 169  150 - 400 K/uL Final  . Neutrophils Relative % 02/24/2014 62  43 - 77 % Final  . Neutro Abs 02/24/2014 2.9  1.7 - 7.7 K/uL Final  . Lymphocytes Relative 02/24/2014 23  12 - 46 % Final  . Lymphs Abs 02/24/2014 1.1  0.7 - 4.0 K/uL Final  . Monocytes Relative 02/24/2014 13* 3 - 12 % Final  .  Monocytes Absolute 02/24/2014 0.6  0.1 - 1.0 K/uL Final  . Eosinophils Relative 02/24/2014 2  0 - 5 % Final  . Eosinophils Absolute 02/24/2014 0.1  0.0 - 0.7 K/uL Final  . Basophils Relative 02/24/2014 0  0 - 1 % Final  . Basophils Absolute 02/24/2014 0.0  0.0 - 0.1 K/uL Final  . Sodium 02/24/2014 142  137 - 147 mEq/L Final  . Potassium 02/24/2014 3.8  3.7 - 5.3 mEq/L Final  . Chloride 02/24/2014 106  96 - 112 mEq/L Final  . CO2 02/24/2014 25  19 - 32 mEq/L Final  . Glucose, Bld 02/24/2014 112* 70 - 99 mg/dL Final  . BUN 02/24/2014 9  6 - 23 mg/dL Final  . Creatinine, Ser 02/24/2014 0.62  0.50 - 1.10 mg/dL Final  . Calcium 02/24/2014 8.9  8.4 - 10.5 mg/dL Final  . Total Protein 02/24/2014 7.1  6.0 - 8.3 g/dL Final  . Albumin 02/24/2014 3.6  3.5 - 5.2 g/dL Final  . AST 02/24/2014 21  0 - 37 U/L Final  . ALT 02/24/2014 12  0 - 35 U/L Final  . Alkaline Phosphatase 02/24/2014 66  39 - 117 U/L Final  . Total Bilirubin 02/24/2014 0.2* 0.3 - 1.2 mg/dL Final  . GFR calc non Af Amer 02/24/2014 >90  >90 mL/min Final  . GFR calc Af Amer 02/24/2014 >90  >90 mL/min Final   Comment: (NOTE)                          The eGFR has been calculated using the CKD EPI equation.                          This calculation has not been validated in all clinical situations.  eGFR's persistently <90 mL/min signify possible Chronic Kidney                          Disease.  . Anion gap 02/24/2014 11  5 - 15 Final  . Color, Urine 02/24/2014 YELLOW  YELLOW Final  . APPearance 02/24/2014 CLEAR  CLEAR Final  . Specific Gravity, Urine 02/24/2014 <1.005* 1.005 - 1.030 Final  . pH 02/24/2014 7.0  5.0 - 8.0 Final  . Glucose, UA 02/24/2014 NEGATIVE  NEGATIVE mg/dL Final  . Hgb urine dipstick 02/24/2014 NEGATIVE  NEGATIVE Final  . Bilirubin Urine 02/24/2014 NEGATIVE  NEGATIVE Final  . Ketones, ur 02/24/2014 NEGATIVE  NEGATIVE mg/dL Final  . Protein, ur 02/24/2014 NEGATIVE  NEGATIVE mg/dL Final  .  Urobilinogen, UA 02/24/2014 0.2  0.0 - 1.0 mg/dL Final  . Nitrite 02/24/2014 NEGATIVE  NEGATIVE Final  . Leukocytes, UA 02/24/2014 NEGATIVE  NEGATIVE Final   MICROSCOPIC NOT DONE ON URINES WITH NEGATIVE PROTEIN, BLOOD, LEUKOCYTES, NITRITE, OR GLUCOSE <1000 mg/dL.  Office Visit on 02/17/2014  Component Date Value Ref Range Status  . WBC 02/17/2014 8.1  4.0 - 10.5 K/uL Final  . RBC 02/17/2014 4.80  3.87 - 5.11 MIL/uL Final  . Hemoglobin 02/17/2014 12.7  12.0 - 15.0 g/dL Final  . HCT 02/17/2014 39.2  36.0 - 46.0 % Final  . MCV 02/17/2014 81.7  78.0 - 100.0 fL Final  . MCH 02/17/2014 26.5  26.0 - 34.0 pg Final  . MCHC 02/17/2014 32.4  30.0 - 36.0 g/dL Final  . RDW 02/17/2014 15.9* 11.5 - 15.5 % Final  . Platelets 02/17/2014 236  150 - 400 K/uL Final  . Neutrophils Relative % 02/17/2014 68  43 - 77 % Final  . Neutro Abs 02/17/2014 5.5  1.7 - 7.7 K/uL Final  . Lymphocytes Relative 02/17/2014 23  12 - 46 % Final  . Lymphs Abs 02/17/2014 1.9  0.7 - 4.0 K/uL Final  . Monocytes Relative 02/17/2014 6  3 - 12 % Final  . Monocytes Absolute 02/17/2014 0.5  0.1 - 1.0 K/uL Final  . Eosinophils Relative 02/17/2014 3  0 - 5 % Final  . Eosinophils Absolute 02/17/2014 0.2  0.0 - 0.7 K/uL Final  . Basophils Relative 02/17/2014 0  0 - 1 % Final  . Basophils Absolute 02/17/2014 0.0  0.0 - 0.1 K/uL Final  . Specimen Description 02/17/2014 URINE, CLEAN CATCH   Final  . Special Requests 02/17/2014 NONE   Final  . Culture  Setup Time 02/17/2014    Final                   Value:02/17/2014 23:16                         Performed at [facility]  . Colony Count 02/17/2014    Final                   Value:40,000 COLONIES/ML                         Performed at [facility]  . Culture 02/17/2014    Final                   Value:Multiple bacterial morphotypes present, none predominant. Suggest appropriate recollection if clinically indicated.  Performed at [facility]  . Report Status 02/17/2014 02/18/2014 FINAL   Final  . Color, Urine 02/17/2014 YELLOW  YELLOW Final  . APPearance 02/17/2014 CLEAR  CLEAR Final  . Specific Gravity, Urine 02/17/2014 1.025  1.005 - 1.030 Final  . pH 02/17/2014 6.0  5.0 - 8.0 Final  . Glucose, UA 02/17/2014 NEGATIVE  NEGATIVE mg/dL Final  . Hgb urine dipstick 02/17/2014 TRACE* NEGATIVE Final  . Bilirubin Urine 02/17/2014 NEGATIVE  NEGATIVE Final  . Ketones, ur 02/17/2014 NEGATIVE  NEGATIVE mg/dL Final  . Protein, ur 02/17/2014 NEGATIVE  NEGATIVE mg/dL Final  . Urobilinogen, UA 02/17/2014 0.2  0.0 - 1.0 mg/dL Final  . Nitrite 02/17/2014 NEGATIVE  NEGATIVE Final  . Leukocytes, UA 02/17/2014 SMALL* NEGATIVE Final  . Ferritin 02/17/2014 63  10 - 291 ng/mL Corrected   Performed at [facility]  . Squamous Epithelial / LPF 02/17/2014 FEW* RARE Final  . WBC, UA 02/17/2014 11-20  <3 WBC/hpf Final  . RBC / HPF 02/17/2014 0-2  <3 RBC/hpf Final  . Bacteria, UA 02/17/2014 FEW* RARE Final  . Crystals 02/17/2014 CA OXALATE CRYSTALS* NEGATIVE Final  Infusion on 02/10/2014  Component Date Value Ref Range Status  . WBC 02/10/2014 7.1  4.0 - 10.5 K/uL Final  . RBC 02/10/2014 4.43  3.87 - 5.11 MIL/uL Final  . Hemoglobin 02/10/2014 11.8* 12.0 - 15.0 g/dL Final  . HCT 02/10/2014 36.7  36.0 - 46.0 % Final  . MCV 02/10/2014 82.8  78.0 - 100.0 fL Final  . MCH 02/10/2014 26.6  26.0 - 34.0 pg Final  . MCHC 02/10/2014 32.2  30.0 - 36.0 g/dL Final  . RDW 02/10/2014 16.7* 11.5 - 15.5 % Final  . Platelets 02/10/2014 217  150 - 400 K/uL Final  . Neutrophils Relative % 02/10/2014 66  43 - 77 % Final  . Neutro Abs 02/10/2014 4.8  1.7 - 7.7 K/uL Final  . Lymphocytes Relative 02/10/2014 21  12 - 46 % Final  . Lymphs Abs 02/10/2014 1.5  0.7 - 4.0 K/uL Final  . Monocytes Relative 02/10/2014 10  3 - 12 % Final  . Monocytes Absolute 02/10/2014 0.7  0.1 - 1.0 K/uL Final  . Eosinophils Relative 02/10/2014 2  0 - 5 % Final  .  Eosinophils Absolute 02/10/2014 0.1  0.0 - 0.7 K/uL Final  . Basophils Relative 02/10/2014 1  0 - 1 % Final  . Basophils Absolute 02/10/2014 0.0  0.0 - 0.1 K/uL Final  . Sodium 02/10/2014 138  137 - 147 mEq/L Final  . Potassium 02/10/2014 3.9  3.7 - 5.3 mEq/L Final  . Chloride 02/10/2014 103  96 - 112 mEq/L Final  . CO2 02/10/2014 23  19 - 32 mEq/L Final  . Glucose, Bld 02/10/2014 121* 70 - 99 mg/dL Final  . BUN 02/10/2014 12  6 - 23 mg/dL Final  . Creatinine, Ser 02/10/2014 0.66  0.50 - 1.10 mg/dL Final  . Calcium 02/10/2014 9.1  8.4 - 10.5 mg/dL Final  . Total Protein 02/10/2014 7.2  6.0 - 8.3 g/dL Final  . Albumin 02/10/2014 3.7  3.5 - 5.2 g/dL Final  . AST 02/10/2014 18  0 - 37 U/L Final  . ALT 02/10/2014 11  0 - 35 U/L Final  . Alkaline Phosphatase 02/10/2014 70  39 - 117 U/L Final  . Total Bilirubin 02/10/2014 0.2* 0.3 - 1.2 mg/dL Final  . GFR calc non Af Amer 02/10/2014 89* >90 mL/min Final  . GFR calc  Af Amer 02/10/2014 >90  >90 mL/min Final   Comment: (NOTE)                          The eGFR has been calculated using the CKD EPI equation.                          This calculation has not been validated in all clinical situations.                          eGFR's persistently <90 mL/min signify possible Chronic Kidney                          Disease.  . Anion gap 02/10/2014 12  5 - 15 Final  . CEA 02/10/2014 1.9  0.0 - 5.0 ng/mL Final   Performed at [facility]: No new pathology.  Urinalysis    Component Value Date/Time   COLORURINE YELLOW 02/24/2014 1011   APPEARANCEUR CLEAR 02/24/2014 1011   LABSPEC <1.005* 02/24/2014 1011   PHURINE 7.0 02/24/2014 1011   GLUCOSEU NEGATIVE 02/24/2014 1011   HGBUR NEGATIVE 02/24/2014 [place] 02/24/2014 [place] 02/24/2014 1011   PROTEINUR NEGATIVE 02/24/2014 1011   UROBILINOGEN 0.2 02/24/2014 1011   NITRITE NEGATIVE 02/24/2014 [place] 02/24/2014 1011     RADIOGRAPHIC STUDIES: No results found.  ASSESSMENT:  #1. Stage III-B adenocarcinoma the proximal transverse colon, status post colectomy on 12/29/2013, fair tolerance of initial adjuvant FOLFOX with diarrhea, rule out partial DPD deficiency #2. Blood loss anemia with iron deficiency, status post Feraheme infusion on 02/26/2014 and 03/05/2014..  #3. Mass in the pancreas, asymptomatic    PLAN:  #1. FOLFOX cycle 2 today with 20% decrease in 5-FU infusion dose, same dose of oxaliplatin and 5-FU bolus. #2. Followup in one week with CBC.    All questions were answered. The patient knows to call the clinic with any problems, questions or concerns. We can certainly see the patient much sooner if necessary.   I spent 25 minutes counseling the patient face to face. The total time spent in the appointment was 30 minutes.    Doroteo Bradford, MD 03/10/2014 10:42 AM  DISCLAIMER:  This note was dictated with voice recognition software.  Similar sounding words can inadvertently be transcribed inaccurately and may not be corrected upon review.

## 2014-03-10 NOTE — Progress Notes (Signed)
Tolerated well

## 2014-03-10 NOTE — Progress Notes (Signed)
Chart review.

## 2014-03-12 ENCOUNTER — Telehealth (HOSPITAL_COMMUNITY): Payer: Self-pay

## 2014-03-12 ENCOUNTER — Encounter (HOSPITAL_BASED_OUTPATIENT_CLINIC_OR_DEPARTMENT_OTHER): Payer: Medicare Other

## 2014-03-12 ENCOUNTER — Encounter (HOSPITAL_COMMUNITY): Payer: Self-pay

## 2014-03-12 VITALS — BP 134/63 | HR 58 | Temp 98.1°F | Resp 16

## 2014-03-12 DIAGNOSIS — Z452 Encounter for adjustment and management of vascular access device: Secondary | ICD-10-CM | POA: Diagnosis not present

## 2014-03-12 DIAGNOSIS — C189 Malignant neoplasm of colon, unspecified: Secondary | ICD-10-CM

## 2014-03-12 DIAGNOSIS — C184 Malignant neoplasm of transverse colon: Secondary | ICD-10-CM

## 2014-03-12 MED ORDER — HEPARIN SOD (PORK) LOCK FLUSH 100 UNIT/ML IV SOLN
500.0000 [IU] | Freq: Once | INTRAVENOUS | Status: AC | PRN
  Administered 2014-03-12: 500 [IU]
  Filled 2014-03-12: qty 5

## 2014-03-12 MED ORDER — SODIUM CHLORIDE 0.9 % IJ SOLN
10.0000 mL | INTRAMUSCULAR | Status: DC | PRN
  Administered 2014-03-12: 10 mL

## 2014-03-12 NOTE — Progress Notes (Signed)
..  Amanda Norris returns today for port de access and flush after 46 hr continous infusion of 36fu.  Portacath located rt chest wall deaccessed of H 20 needle. Good blood return present. Portacath flushed with 54ml NS and 500U/72ml Heparin and needle removed intact. Procedure without incident. Patient tolerated procedure well. C/o tingling and pain in both hands. Encouraged to apply warmth to hands and take hydrocodone as needed for pain. Patient aware that the pain and neuropathy are related to the chemo drug oxaliplatin and that we will re-evaluate prior to next dose.

## 2014-03-17 ENCOUNTER — Telehealth (HOSPITAL_COMMUNITY): Payer: Self-pay | Admitting: Oncology

## 2014-03-17 ENCOUNTER — Encounter (HOSPITAL_BASED_OUTPATIENT_CLINIC_OR_DEPARTMENT_OTHER): Payer: Medicare Other

## 2014-03-17 DIAGNOSIS — R918 Other nonspecific abnormal finding of lung field: Secondary | ICD-10-CM | POA: Diagnosis not present

## 2014-03-17 DIAGNOSIS — C184 Malignant neoplasm of transverse colon: Secondary | ICD-10-CM | POA: Diagnosis not present

## 2014-03-17 DIAGNOSIS — Z9049 Acquired absence of other specified parts of digestive tract: Secondary | ICD-10-CM | POA: Diagnosis not present

## 2014-03-17 DIAGNOSIS — D649 Anemia, unspecified: Secondary | ICD-10-CM | POA: Diagnosis not present

## 2014-03-17 DIAGNOSIS — C189 Malignant neoplasm of colon, unspecified: Secondary | ICD-10-CM | POA: Diagnosis not present

## 2014-03-17 LAB — CBC WITH DIFFERENTIAL/PLATELET
BASOS ABS: 0 10*3/uL (ref 0.0–0.1)
BASOS PCT: 0 % (ref 0–1)
Eosinophils Absolute: 0.1 10*3/uL (ref 0.0–0.7)
Eosinophils Relative: 2 % (ref 0–5)
HCT: 37.8 % (ref 36.0–46.0)
Hemoglobin: 12.8 g/dL (ref 12.0–15.0)
LYMPHS ABS: 1.6 10*3/uL (ref 0.7–4.0)
Lymphocytes Relative: 33 % (ref 12–46)
MCH: 28.1 pg (ref 26.0–34.0)
MCHC: 33.9 g/dL (ref 30.0–36.0)
MCV: 83.1 fL (ref 78.0–100.0)
Monocytes Absolute: 0.6 10*3/uL (ref 0.1–1.0)
Monocytes Relative: 12 % (ref 3–12)
Neutro Abs: 2.5 10*3/uL (ref 1.7–7.7)
Neutrophils Relative %: 53 % (ref 43–77)
PLATELETS: 130 10*3/uL — AB (ref 150–400)
RBC: 4.55 MIL/uL (ref 3.87–5.11)
RDW: 18 % — ABNORMAL HIGH (ref 11.5–15.5)
WBC: 4.7 10*3/uL (ref 4.0–10.5)

## 2014-03-17 NOTE — Progress Notes (Signed)
LABS DRAWN FOR CBCD. 

## 2014-03-17 NOTE — Telephone Encounter (Signed)
Patient called reporting that "I want to stop chemotherapy and cancel my appointment."  I spent time talking about this with the patient.  She reports that since her surgery, she is having diarrhea.  This may be from her surgery, and worsened by chemotherapy possibly. I do not remember her complaining of this before.  She also notes that her primary care provider's office called her reporting that she needs to be seen by her PCP immediately due to her "liver tests."  She denies having lab work performed at [facility].  She only had lab work by [place] today.  She notes that [facility] called her reporting liver test changes on lab performed this AM.  We did not perform liver function test today.  Her last liver tests were WNL and well within the 3x ULN on ALT and AST.  Alk Phos is WNL.  I suspect they have the wrong patient.  CBC    Component Value Date/Time   WBC 4.7 03/17/2014 1105   RBC 4.55 03/17/2014 1105   HGB 12.8 03/17/2014 1105   HCT 37.8 03/17/2014 1105   PLT 130* 03/17/2014 1105   MCV 83.1 03/17/2014 1105   MCH 28.1 03/17/2014 1105   MCHC 33.9 03/17/2014 1105   RDW 18.0* 03/17/2014 1105   LYMPHSABS 1.6 03/17/2014 1105   MONOABS 0.6 03/17/2014 1105   EOSABS 0.1 03/17/2014 1105   BASOSABS 0.0 03/17/2014 1105   Results for Amanda, Norris (MRN [id number]) as of 03/17/2014 15:23  Ref. Range 02/24/2014 08:59 02/24/2014 10:11 03/10/2014 09:21  Alkaline Phosphatase Latest Range: 39-117 U/L 66  76  Albumin Latest Range: 3.5-5.2 g/dL 3.6  3.7  AST Latest Range: 0-37 U/L 21  37  ALT Latest Range: 0-35 U/L 12  36 (H)     With this being said, she reports, "I have been praying over this for sometime and I have decided to stop chemotherapy."  She is agreeable to see me next week to go over this decision.  I will defer chemotherapy until she is seen to make sure this is the decision she wishes.  KEFALAS,THOMAS 03/17/2014

## 2014-03-23 NOTE — Progress Notes (Addendum)
Glo Herring., MD [place] [id number]  Adenocarcinoma of colon - Plan: CBC with Differential, Comprehensive metabolic panel, CEA, CT Abdomen Pelvis W Contrast  CURRENT THERAPY: S/P 3 cycles of FOLFOX in the adjuvant setting  INTERVAL HISTORY: Amanda Norris 68 y.o. 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 Now of adjuvant FOLFOX chemotherapy to reduce the risk of recurrence    Adenocarcinoma of colon   12/09/2013 Initial Diagnosis 1. Colon, biopsy, proximal transverse mass INVASIVE ADENOCARCINOMA.   12/29/2013 Definitive Surgery Colon, segmental resection for tumor, right - INVASIVE COLORECTAL ADENOCARCINOMA, 7 CM, EXTENDING INTO PERICOLONIC CONNECTIVE TISSUE. - METASTATIC CARCINOMA IN 6 OF 33 LYMPH NODES (6/33).   02/10/2014 - 03/10/2014 Adjuvant Chemotherapy FOLFOX x 3 cycles   03/11/2014 Adverse Reaction Patient called reporting diarrhea despite dose reduction and she wants to stop therapy.  Patient seen on 03/24/14 to discuss other treatment options and she stands by her decision to stop all therapy.   I personally reviewed and went over laboratory results with the patient.  The results are noted within this dictation.  She reports that she "felt terrible on therapy."  She experienced significant cold intolerance in addition to diarrhea (despite a dose reduction in 5-FU). She called our clinic the other day, see telephone encounter, reporting that she wants to stop all therapy. She was agreeable to come see me today to discuss her options and educate her regarding the role of chemotherapy.  Patient educated regarding the role of adjuvant systemic chemotherapy in Stage III colon cancer patients.  The role is to reduce the risk of recurrence. The reduction in the risk of recurrence is approximately 20% in those who complete adjuvant chemotherapy.     We discussed other options with regards for treatment. These include the following: 1. 5-FU, leucovorin 2. Xeloda 1000 mg twice a day for 14 days on and 7 days off 3. Hold all adjuvant chemotherapy.  Despite our discussion, Amanda Norris wishes to continue with her initial response which is #3 (hold all adjuvant chemotherapy). She reports that she felt horrible and chemotherapy and does not want to do any further therapy. She is educated the 5-FU/leucovorin and Xeloda single agent is better tolerated than FOLFOX chemotherapy. Despite this, she holds faxed to her decision which we will absolutely respect.  Is reasonable this time to switch gears and move on to a surveillance regimen.  This will be outlined below.  She wishes to have her Port-A-Cath removed and Dr. Arnoldo Morale is a surgeon who placed the device. Therefore, we will get her an appointment to see Dr. Arnoldo Morale for Port-A-Cath removal at the patient's request.  She reports that she feels much better now she's 2 weeks off of therapy.  Oncologically, the patient denies any complaints and ROS questioning is negative.  Past Medical History  Diagnosis Date  . PONV (postoperative nausea and vomiting)   . Colon cancer   . Arthritis     wrist and thumbs    has Mass of pancreas; Adenocarcinoma of colon; and Iron deficiency on her problem list.     is allergic to sulfa antibiotics.  Amanda Norris had no medications administered during this visit.  Past Surgical History  Procedure Laterality Date  . Herniated disc      1996-c5-c7  . Colonoscopy N/A 12/12/2013    Procedure: COLONOSCOPY;  Surgeon: Rogene Houston, MD;  [facility];  Service: Endoscopy;  Laterality: N/A;  730  . Eus N/A 12/18/2013    Procedure: UPPER ENDOSCOPIC ULTRASOUND (EUS) LINEAR;  Surgeon: Milus Banister, MD;  Location: [facility];  Service: Endoscopy;  Laterality: N/A;  . Partial colectomy N/A 12/29/2013    Procedure: RIGHT HEMICOLECTOMY;  Surgeon: Jamesetta So, MD;  Location: [facility];  Service: General;  Laterality: N/A;  . Portacath placement Right 02/04/2014    Procedure: INSERTION PORT-A-CATH;  Surgeon: Jamesetta So, MD;  Location: [facility];  Service: General;  Laterality: Right;    Denies any headaches, dizziness, double vision, fevers, chills, night sweats, nausea, vomiting, diarrhea, constipation, chest pain, heart palpitations, shortness of breath, blood in stool, black tarry stool, urinary pain, urinary burning, urinary frequency, hematuria.   PHYSICAL EXAMINATION  ECOG PERFORMANCE STATUS: 0 - Asymptomatic  Filed Vitals:   03/24/14 1200  BP: 150/98  Pulse: 74  Temp: 99 F (37.2 C)  Resp: 16    GENERAL:alert, no distress, well nourished, well developed, comfortable, cooperative, obese and smiling SKIN: skin color, texture, turgor are normal, no rashes or significant lesions HEAD: Normocephalic, No masses, lesions, tenderness or abnormalities EYES: normal, PERRLA, EOMI, Conjunctiva are pink and non-injected EARS: External ears normal OROPHARYNX:mucous membranes are moist  NECK: supple, trachea midline LYMPH:  not examined BREAST:not examined LUNGS: not examined HEART: not examined ABDOMEN:not examined BACK: Back symmetric, no curvature. EXTREMITIES:less then 2 second capillary refill, no skin discoloration, no clubbing, no cyanosis  NEURO: alert & oriented x 3 with fluent speech, no focal motor/sensory deficits, gait normal   LABORATORY DATA: CBC    Component Value Date/Time   WBC 4.7 03/17/2014 1105   RBC 4.55 03/17/2014 1105   HGB 12.8 03/17/2014 1105   HCT 37.8 03/17/2014 1105   PLT 130* 03/17/2014 1105   MCV 83.1 03/17/2014 1105   MCH 28.1 03/17/2014 1105   MCHC 33.9 03/17/2014 1105   RDW 18.0* 03/17/2014 1105   LYMPHSABS 1.6 03/17/2014 1105   MONOABS 0.6 03/17/2014 1105   EOSABS 0.1 03/17/2014 1105   BASOSABS 0.0 03/17/2014 1105      Chemistry      Component Value Date/Time   NA 140 03/10/2014 0921   K 3.9 03/10/2014 0921    CL 105 03/10/2014 0921   CO2 27 03/10/2014 0921   BUN 13 03/10/2014 0921   CREATININE 0.67 03/10/2014 0921      Component Value Date/Time   CALCIUM 9.1 03/10/2014 0921   ALKPHOS 76 03/10/2014 0921   AST 37 03/10/2014 0921   ALT 36* 03/10/2014 0921   BILITOT 0.2* 03/10/2014 0921     Lab Results  Component Value Date   CEA 4.4 03/10/2014      ASSESSMENT:  1. Stage III-B adenocarcinoma the proximal transverse colon, status post colectomy on 12/29/2013, fair tolerance of adjuvant FOLFOX with diarrhea requiring a 20% dose reduction in 5FU.  Despite dose-reduction, patient has decided to forego chemotherapy and she has stopped after 3 cycles of FOLFOX 2. DPD Gene mutation negative. 3. Blood loss anemia with iron deficiency, status post Feraheme infusion on 02/26/2014 and 03/05/2014. 4. Mass in the pancreas, asymptomatic  Patient Active Problem List   Diagnosis Date Noted  . Iron deficiency 02/18/2014  . Adenocarcinoma of colon 12/29/2013  . Mass of pancreas 12/08/2013     PLAN:  1. I personally reviewed and went over laboratory results with the patient.  The results are noted within this dictation. 2. Patient education  regarding role of adjuvant chemotherapy 3. Patient education provided 4. Discussed other treatment options, she has declined. 5. Referral to Dr. Arnoldo Morale for port removal 6. Labs in 3 months: CBC diff, CMET, CEA 7. Colonoscopy within the next year 8. CT surveillance tests in June 2016. 9. Deletion of treatment plan 10. Deletion of pre-chemo labs 11. Return in 3 months for follow-up   THERAPY PLAN:  NCCN guidelines for Stage III Colon Cancer are as follows:  1. H+P every 3-6 months for 2 years and then every 6 months for a total of 5 years.  2. CEA every 3-6 months for 2 years and then every 6 months for a total of 5 years  3. Colonoscopy at 1 year and then as indicated (except if no preoperative colonoscopy due to obstructing lesion, colonoscopy in 3-6 months)   A. If advanced  adenoma, then repeat in 1 year   B. If no advanced adenoma, repeat in 3 years, then every 5 years.   4. CT CAP annually for up to 5 years for patients at high risk for recurrence.  5. PET scan is not routinely recommended.    All questions were answered. The patient knows to call the clinic with any problems, questions or concerns. We can certainly see the patient much sooner if necessary.  Patient and plan discussed with Dr. Farrel Gobble and he is in agreement with the aforementioned.   Amanda Norris 03/24/2014

## 2014-03-24 ENCOUNTER — Encounter (HOSPITAL_BASED_OUTPATIENT_CLINIC_OR_DEPARTMENT_OTHER): Payer: Medicare Other | Admitting: Oncology

## 2014-03-24 ENCOUNTER — Encounter (HOSPITAL_COMMUNITY): Payer: Self-pay | Admitting: Oncology

## 2014-03-24 ENCOUNTER — Encounter (HOSPITAL_COMMUNITY): Payer: Self-pay | Admitting: Lab

## 2014-03-24 ENCOUNTER — Inpatient Hospital Stay (HOSPITAL_COMMUNITY): Payer: Medicare Other

## 2014-03-24 VITALS — BP 150/98 | HR 74 | Temp 99.0°F | Resp 16 | Wt 156.5 lb

## 2014-03-24 DIAGNOSIS — C189 Malignant neoplasm of colon, unspecified: Secondary | ICD-10-CM | POA: Diagnosis not present

## 2014-03-24 NOTE — Patient Instructions (Signed)
[facility] Discharge Instructions  RECOMMENDATIONS MADE BY THE CONSULTANT AND ANY TEST RESULTS WILL BE SENT TO YOUR REFERRING PHYSICIAN.  EXAM FINDINGS BY THE PHYSICIAN TODAY AND SIGNS OR SYMPTOMS TO REPORT TO CLINIC OR PRIMARY PHYSICIAN: Exam and findings as discussed by Robynn Pane, PA-C.  We will honor your wishes regarding stopping chemotherapy.  We will proceed to surveillance only by checking your labs (CBC/diff, CMET, CEA) every 3 months for 2 years.  Report changes in bowel habits, blood in your stool or other concerns.    INSTRUCTIONS/FOLLOW-UP: Labs every 3 months and office visit in 3 months.  Thank you for choosing [facility] to provide your oncology and hematology care.  To afford each patient quality time with our providers, please arrive at least 15 minutes before your scheduled appointment time.  With your help, our goal is to use those 15 minutes to complete the necessary work-up to ensure our physicians have the information they need to help with your evaluation and healthcare recommendations.    Effective January 1st, 2014, we ask that you re-schedule your appointment with our physicians should you arrive 10 or more minutes late for your appointment.  We strive to give you quality time with our providers, and arriving late affects you and other patients whose appointments are after yours.    Again, thank you for choosing [facility].  Our hope is that these requests will decrease the amount of time that you wait before being seen by our physicians.       _____________________________________________________________  Should you have questions after your visit to [facility], please contact our office at [phone] between the hours of 8:30 a.m. and 4:30 p.m.  Voicemails left after 4:30 p.m. will not be returned until the following business day.  For prescription refill requests, have your pharmacy contact our  office with your prescription refill request.    _______________________________________________________________  We hope that we have given you very good care.  You may receive a patient satisfaction survey in the mail, please complete it and return it as soon as possible.  We value your feedback!  _______________________________________________________________  Have you asked about our STAR program?  STAR stands for Survivorship Training and Rehabilitation, and this is a nationally recognized cancer care program that focuses on survivorship and rehabilitation.  Cancer and cancer treatments may cause problems, such as, pain, making you feel tired and keeping you from doing the things that you need or want to do. Cancer rehabilitation can help. Our goal is to reduce these troubling effects and help you have the best quality of life possible.  You may receive a survey from a nurse that asks questions about your current state of health.  Based on the survey results, all eligible patients will be referred to the [facility] program for an evaluation so we can better serve you!  A frequently asked questions sheet is available upon request.

## 2014-03-24 NOTE — Progress Notes (Signed)
Referral made to Dr Arnoldo Morale for port removal.  Appointment is Sept 23 at 9.  Patient is aware of appointment by [place] on 9/15.

## 2014-03-25 NOTE — H&P (Signed)
Amanda Norris is an 68 y.o. female.   Chief Complaint: Colon carcinoma, desired to have Port-A-Cath removed HPI: Patient is a 68 year old female who was recent diagnosed with colon carcinoma. She started out with chemotherapy, but has since not tolerated and has stopped chemotherapy treatment. She would like port removed.  Past Medical History  Diagnosis Date  . PONV (postoperative nausea and vomiting)   . Colon cancer   . Arthritis     wrist and thumbs    Past Surgical History  Procedure Laterality Date  . Herniated disc      1996-c5-c7  . Colonoscopy N/A 12/12/2013    Procedure: COLONOSCOPY;  Surgeon: Rogene Houston, MD;  [facility];  Service: Endoscopy;  Laterality: N/A;  730  . Eus N/A 12/18/2013    Procedure: UPPER ENDOSCOPIC ULTRASOUND (EUS) LINEAR;  Surgeon: Milus Banister, MD;  Location: [facility];  Service: Endoscopy;  Laterality: N/A;  . Partial colectomy N/A 12/29/2013    Procedure: RIGHT HEMICOLECTOMY;  Surgeon: Jamesetta So, MD;  Location: [facility];  Service: General;  Laterality: N/A;  . Portacath placement Right 02/04/2014    Procedure: INSERTION PORT-A-CATH;  Surgeon: Jamesetta So, MD;  Location: [facility];  Service: General;  Laterality: Right;    No family history on file. Social History:  reports that she quit smoking about 54 years ago. Her smoking use included Cigarettes. She has a 1.25 pack-year smoking history. She has never used smokeless tobacco. She reports that she does not drink alcohol or use illicit drugs.  Allergies:  Allergies  Allergen Reactions  . Sulfa Antibiotics Other (See Comments)    "Sores in my mouth"    No prescriptions prior to admission    No results found for this or any previous visit (from the past 48 hour(s)). No results found.  Review of Systems  Constitutional: Negative.   Cardiovascular: Negative.   Gastrointestinal: Negative.   Skin: Negative.     There were no vitals taken for this visit. Physical Exam   Constitutional: She is oriented to person, place, and time. She appears well-developed and well-nourished.  HENT:  Head: Normocephalic and atraumatic.  Neck: Normal range of motion.  Cardiovascular: Normal rate, regular rhythm and normal heart sounds.   Respiratory: Effort normal and breath sounds normal.  GI: Soft. Bowel sounds are normal.  Neurological: She is alert and oriented to person, place, and time.  Skin: Skin is warm and dry.     Assessment/Plan Impression: Colon carcinoma, cessation of chemotherapy Plan: We'll remove Port-A-Cath and minor procedure room. Risks and benefits of the procedure were fully explained to the patient, who gave informed consent.  Makana Rostad A 03/25/2014, 10:11 AM

## 2014-03-26 ENCOUNTER — Encounter (HOSPITAL_COMMUNITY): Payer: Medicare Other

## 2014-03-31 ENCOUNTER — Encounter (HOSPITAL_COMMUNITY): Payer: Self-pay | Admitting: Pharmacy Technician

## 2014-04-01 ENCOUNTER — Encounter (HOSPITAL_COMMUNITY): Payer: Self-pay | Admitting: *Deleted

## 2014-04-01 ENCOUNTER — Ambulatory Visit (HOSPITAL_COMMUNITY)
Admission: RE | Admit: 2014-04-01 | Discharge: 2014-04-01 | Disposition: A | Discharge status: Home / Self Care | Payer: Medicare Other | Source: Ambulatory Visit | Attending: General Surgery | Admitting: General Surgery

## 2014-04-01 ENCOUNTER — Encounter (HOSPITAL_COMMUNITY)
Admission: RE | Disposition: A | Discharge status: Home / Self Care | Payer: Self-pay | Source: Ambulatory Visit | Attending: General Surgery

## 2014-04-01 DIAGNOSIS — Z9221 Personal history of antineoplastic chemotherapy: Secondary | ICD-10-CM | POA: Insufficient documentation

## 2014-04-01 DIAGNOSIS — C189 Malignant neoplasm of colon, unspecified: Secondary | ICD-10-CM | POA: Diagnosis not present

## 2014-04-01 DIAGNOSIS — Z452 Encounter for adjustment and management of vascular access device: Secondary | ICD-10-CM | POA: Insufficient documentation

## 2014-04-01 DIAGNOSIS — Z9049 Acquired absence of other specified parts of digestive tract: Secondary | ICD-10-CM | POA: Diagnosis not present

## 2014-04-01 DIAGNOSIS — Z87891 Personal history of nicotine dependence: Secondary | ICD-10-CM | POA: Insufficient documentation

## 2014-04-01 HISTORY — PX: PORT-A-CATH REMOVAL: SHX5289

## 2014-04-01 SURGERY — MINOR REMOVAL PORT-A-CATH
Anesthesia: LOCAL | Laterality: Right

## 2014-04-01 MED ORDER — LIDOCAINE HCL (PF) 1 % IJ SOLN
INTRAMUSCULAR | Status: AC
  Filled 2014-04-01: qty 5

## 2014-04-01 MED ORDER — LIDOCAINE HCL 1 % IJ SOLN
INTRAMUSCULAR | Status: DC | PRN
  Administered 2014-04-01: 2 mg
  Administered 2014-04-01: 4 mg

## 2014-04-01 NOTE — Discharge Instructions (Signed)

## 2014-04-01 NOTE — Interval H&P Note (Signed)
History and Physical Interval Note:  04/01/2014 9:14 AM  Amanda Norris  has presented today for surgery, with the diagnosis of colon cancer  The various methods of treatment have been discussed with the patient and family. After consideration of risks, benefits and other options for treatment, the patient has consented to  Procedure(s): MINOR REMOVAL PORT-A-CATH (Right) as a surgical intervention .  The patient's history has been reviewed, patient examined, no change in status, stable for surgery.  I have reviewed the patient's chart and labs.  Questions were answered to the patient's satisfaction.     Aviva Signs A

## 2014-04-01 NOTE — Op Note (Signed)
Patient:  Amanda Norris  DOB:  [birth year]  MRN:  [id number]   Preop Diagnosis:  Colon carcinoma, finished with chemotherapy  Postop Diagnosis:  Same  Procedure:  Port-A-Cath removal  Surgeon:  Aviva Signs, M.D.  Anes:  Local  Indications:  Patient is a 68 year old white female who underwent chemotherapy for colon carcinoma. She is now completed chemotherapy in once to have the Port-A-Cath removed. The risks and benefits of the procedure were fully explained to the patient, who gave informed consent.  Procedure note:  Patient is placed the supine position. The right upper chest was prepped and draped using usual sterile technique with DuraPrep. Surgical site confirmation was performed. 1% Xylocaine was used for local anesthesia.  An incision was made to the previous surgical incision site. This was taken down to the port. The port was removed in total without difficulty. It was disposed of. The subcutaneous layer was reapproximated using 3-0 Vicryl interrupted suture. The skin was closed using a 4-0 Vicryl subcuticular suture. Dermabond was then applied.  All tape and needle counts were correct at the end of the procedure. Patient was discharged in stable condition.  Complications:  None  EBL:  Minimal  Specimen:  None

## 2014-04-03 ENCOUNTER — Encounter (HOSPITAL_COMMUNITY): Payer: Self-pay | Admitting: General Surgery

## 2014-04-06 ENCOUNTER — Ambulatory Visit (HOSPITAL_COMMUNITY): Payer: Medicare Other

## 2014-04-06 ENCOUNTER — Inpatient Hospital Stay (HOSPITAL_COMMUNITY): Payer: Medicare Other

## 2014-04-07 ENCOUNTER — Inpatient Hospital Stay (HOSPITAL_COMMUNITY): Payer: Medicare Other

## 2014-04-08 ENCOUNTER — Encounter (HOSPITAL_COMMUNITY): Payer: Medicare Other

## 2014-04-09 LAB — MISCELLANEOUS TEST

## 2014-04-29 ENCOUNTER — Encounter: Payer: Self-pay | Admitting: Oncology

## 2014-06-21 NOTE — Progress Notes (Signed)
Amanda Herring., MD [place] [address]  Adenocarcinoma of colon  Iron deficiency  CURRENT THERAPY: Surveillance per NCCN guidelines  INTERVAL HISTORY: Amanda Norris 68 y.o. female returns for  regular  visit for followup of  Stage III moderately differentiated adenocarcinoma of the proximal transverse colon, status post resection on 12/29/2013. Patient received 3/12 cycles of adjuvant FOLFOX chemotherapy to reduce the risk of recurrence, but she then refused further therapy due to patient reported intolerance.      Adenocarcinoma of colon   12/09/2013 Initial Diagnosis 1. Colon, biopsy, proximal transverse mass INVASIVE ADENOCARCINOMA.   12/29/2013 Definitive Surgery Colon, segmental resection for tumor, right - INVASIVE COLORECTAL ADENOCARCINOMA, 7 CM, EXTENDING INTO PERICOLONIC CONNECTIVE TISSUE. - METASTATIC CARCINOMA IN 6 OF 33 LYMPH NODES (6/33).   02/10/2014 - 03/10/2014 Adjuvant Chemotherapy FOLFOX x 3 cycles   03/11/2014 Adverse Reaction Patient called reporting diarrhea despite dose reduction and she wants to stop therapy.  Patient seen on 03/24/14 to discuss other treatment options and she stands by her decision to stop all therapy.   04/01/2014 Surgery Port-a-cath removal by Dr. Arnoldo Morale.   I personally reviewed and went over laboratory results with the patient.  The results are noted within this dictation.  Chart reviewed.  She is S/P port removal by Dr. Arnoldo Morale on 04/01/2014.  We again discussed other treatment options.  These include the following: 1. 5-FU, leucovorin 2. Xeloda 1000 mg twice a day for 14 days on and 7 days off 3. Hold all adjuvant chemotherapy.  She continues to refuse further therapy.  Doing very well and asks a few appropriate questions including "which I be looking out for." I given her a list of signs and symptoms to be on the lookout for including unintentional weight loss, change in bowel habits, pencil thin stools, blood in stool,  black tarry stool, change in caliber of stool, change in frequency of stool, severe fatigue, nausea and vomiting lasting greater than 72 hours, etc. She is also educated on the active surveillance we'll be performing per NCCN guidelines.  This will include a colonoscopy within the first 12 months of completing chemotherapy, CT of abdomen and pelvis every 12 months, and CEAs every 3 months for the first 2 years. In her discharge instructions, given her a copy of the [facility] and guidelines pertaining to surveillance for her cancer. She will have this to go home with.  Per guidelines, we will get her set up to see Dr. Laural Golden in the future for coordination of colonoscopy. Additionally, we will set up a CT of abdomen and pelvis in June 2016 for surveillance. She will continue with CEAs every 3 months and office visits every 3 months. She is educated that her greatest risk of recurrence is within the first 2 years. As a result, we will perform close surveillance tests and a follow-up appointment.  Oncologically, the patient denies any complaints and ROS questioning is negative.  Past Medical History  Diagnosis Date  . PONV (postoperative nausea and vomiting)   . Colon cancer   . Arthritis     wrist and thumbs    has Mass of pancreas; Adenocarcinoma of colon; and Iron deficiency on her problem list.     is allergic to sulfa antibiotics.  Amanda Norris does not currently have medications on file.  Past Surgical History  Procedure Laterality Date  . Herniated disc      [id number]  . Colonoscopy N/A 12/12/2013    Procedure: COLONOSCOPY;  Surgeon: Rogene Houston, MD;  [facility];  Service: Endoscopy;  Laterality: N/A;  730  . Eus N/A 12/18/2013    Procedure: UPPER ENDOSCOPIC ULTRASOUND (EUS) LINEAR;  Surgeon: Milus Banister, MD;  Location: [facility];  Service: Endoscopy;  Laterality: N/A;  . Partial colectomy N/A 12/29/2013    Procedure: RIGHT HEMICOLECTOMY;  Surgeon: Jamesetta So, MD;   Location: [facility];  Service: General;  Laterality: N/A;  . Portacath placement Right 02/04/2014    Procedure: INSERTION PORT-A-CATH;  Surgeon: Jamesetta So, MD;  Location: [facility];  Service: General;  Laterality: Right;  . Port-a-cath removal Right 04/01/2014    Procedure: MINOR REMOVAL PORT-A-CATH;  Surgeon: Jamesetta So, MD;  Location: [facility];  Service: General;  Laterality: Right;    Denies any headaches, dizziness, double vision, fevers, chills, night sweats, nausea, vomiting, diarrhea, constipation, chest pain, heart palpitations, shortness of breath, blood in stool, black tarry stool, urinary pain, urinary burning, urinary frequency, hematuria.   PHYSICAL EXAMINATION  ECOG PERFORMANCE STATUS: 0 - Asymptomatic  Filed Vitals:   06/23/14 1000  BP: 117/78  Pulse: 68  Temp: 99 F (37.2 C)  Resp: 18    GENERAL:alert, no distress, well nourished, well developed, comfortable, cooperative and smiling SKIN: skin color, texture, turgor are normal, no rashes or significant lesions HEAD: Normocephalic, No masses, lesions, tenderness or abnormalities EYES: normal, PERRLA, EOMI, Conjunctiva are pink and non-injected EARS: External ears normal OROPHARYNX:no exudate, no erythema, lips, buccal mucosa, and tongue normal, dentition normal and mucous membranes are moist  NECK: supple, no adenopathy, thyroid normal size, non-tender, without nodularity, no stridor, non-tender, trachea midline LYMPH:  no palpable lymphadenopathy, no hepatosplenomegaly BREAST:not examined LUNGS: clear to auscultation and percussion HEART: regular rate & rhythm, no murmurs, no gallops, S1 normal and S2 normal ABDOMEN:abdomen soft, non-tender, obese, normal bowel sounds, no masses or organomegaly and no hepatosplenomegaly BACK: Back symmetric, no curvature., No CVA tenderness EXTREMITIES:less then 2 second capillary refill, no joint deformities, effusion, or inflammation, no edema, no skin discoloration, no clubbing,  no cyanosis  NEURO: alert & oriented x 3 with fluent speech, no focal motor/sensory deficits, gait normal    LABORATORY DATA: CBC    Component Value Date/Time   WBC 7.1 06/23/2014 1057   RBC 4.33 06/23/2014 1057   HGB 13.3 06/23/2014 1057   HCT 40.0 06/23/2014 1057   PLT 205 06/23/2014 1057   MCV 92.4 06/23/2014 1057   MCH 30.7 06/23/2014 1057   MCHC 33.3 06/23/2014 1057   RDW 12.9 06/23/2014 1057   LYMPHSABS 1.9 06/23/2014 1057   MONOABS 0.6 06/23/2014 1057   EOSABS 0.1 06/23/2014 1057   BASOSABS 0.0 06/23/2014 1057      Chemistry      Component Value Date/Time   NA 140 03/10/2014 0921   K 3.9 03/10/2014 0921   CL 105 03/10/2014 0921   CO2 27 03/10/2014 0921   BUN 13 03/10/2014 0921   CREATININE 0.67 03/10/2014 0921      Component Value Date/Time   CALCIUM 9.1 03/10/2014 0921   ALKPHOS 76 03/10/2014 0921   AST 37 03/10/2014 0921   ALT 36* 03/10/2014 0921   BILITOT 0.2* 03/10/2014 0921     Lab Results  Component Value Date   CEA 4.4 03/10/2014     ASSESSMENT:  1. Stage III-B adenocarcinoma the proximal transverse colon, status post colectomy on 12/29/2013, fair tolerance of adjuvant FOLFOX with diarrhea requiring a 20% dose reduction in 5FU. Despite  dose-reduction, patient has decided to forego chemotherapy and she has stopped after 3 cycles of FOLFOX 2. DPD Gene mutation negative. 3. Blood loss anemia with iron deficiency, status post Feraheme infusion on 02/26/2014 and 03/05/2014. 4. Mass in the pancreas, asymptomatic.  Will need further surveillance.    Patient Active Problem List   Diagnosis Date Noted  . Iron deficiency 02/18/2014  . Adenocarcinoma of colon 12/29/2013  . Mass of pancreas 12/08/2013     PLAN:  1. I personally reviewed and went over laboratory results with the patient.  The results are noted within this dictation. 2. Labs today: CBC diff, CMET, CEA 3. Labs in 3 months: CBC diff, CMET, CEA 4. Colonoscopy within the next 9 months.   Referral to Dr. Laural Golden (GI) 5. CT CAP with contrast in 6 months (June 2016).   6. Return in 3 months for follow-up   THERAPY PLAN:  NCCN guidelines for surveillance for T3/T4, N0-2, M0 colorectal cancer are:   A. H+P every 3-6 months x 2 years and then every 6 months for a total of 5 years   B. CEA every 3 months x 2 years and then every 6 months for a total of 5 years   C. CT abd/pelvis annually for up to 5 years   D. Colonoscopy in 1 year except if no preoperative colonoscopy due to obstructing lesion, colonoscopy in 3-6 months.    1. If advanced adenoma, repeat in 1 year   2. If no advanced adenoma, repeat in 3 years, then every 5 years  E. PET scan not routinely recommended.   All questions were answered. The patient knows to call the clinic with any problems, questions or concerns. We can certainly see the patient much sooner if necessary.  Patient and plan discussed with Dr. Farrel Gobble and he is in agreement with the aforementioned.   Amanda Norris 06/23/2014

## 2014-06-23 ENCOUNTER — Encounter (HOSPITAL_COMMUNITY): Payer: Self-pay | Admitting: Oncology

## 2014-06-23 ENCOUNTER — Encounter (HOSPITAL_COMMUNITY): Payer: Medicare Other | Attending: Oncology | Admitting: Oncology

## 2014-06-23 ENCOUNTER — Telehealth (HOSPITAL_COMMUNITY): Payer: Self-pay | Admitting: Emergency Medicine

## 2014-06-23 ENCOUNTER — Encounter (HOSPITAL_BASED_OUTPATIENT_CLINIC_OR_DEPARTMENT_OTHER): Payer: Medicare Other

## 2014-06-23 VITALS — BP 117/78 | HR 68 | Temp 99.0°F | Resp 18 | Wt 162.8 lb

## 2014-06-23 DIAGNOSIS — C189 Malignant neoplasm of colon, unspecified: Secondary | ICD-10-CM | POA: Insufficient documentation

## 2014-06-23 DIAGNOSIS — C184 Malignant neoplasm of transverse colon: Secondary | ICD-10-CM

## 2014-06-23 DIAGNOSIS — Z9221 Personal history of antineoplastic chemotherapy: Secondary | ICD-10-CM | POA: Insufficient documentation

## 2014-06-23 DIAGNOSIS — K868 Other specified diseases of pancreas: Secondary | ICD-10-CM | POA: Insufficient documentation

## 2014-06-23 DIAGNOSIS — Z9049 Acquired absence of other specified parts of digestive tract: Secondary | ICD-10-CM | POA: Diagnosis not present

## 2014-06-23 DIAGNOSIS — E611 Iron deficiency: Secondary | ICD-10-CM

## 2014-06-23 DIAGNOSIS — D500 Iron deficiency anemia secondary to blood loss (chronic): Secondary | ICD-10-CM | POA: Diagnosis not present

## 2014-06-23 LAB — CBC WITH DIFFERENTIAL/PLATELET
Basophils Absolute: 0 10*3/uL (ref 0.0–0.1)
Basophils Relative: 0 % (ref 0–1)
Eosinophils Absolute: 0.1 10*3/uL (ref 0.0–0.7)
Eosinophils Relative: 2 % (ref 0–5)
HCT: 40 % (ref 36.0–46.0)
HEMOGLOBIN: 13.3 g/dL (ref 12.0–15.0)
LYMPHS ABS: 1.9 10*3/uL (ref 0.7–4.0)
LYMPHS PCT: 26 % (ref 12–46)
MCH: 30.7 pg (ref 26.0–34.0)
MCHC: 33.3 g/dL (ref 30.0–36.0)
MCV: 92.4 fL (ref 78.0–100.0)
MONOS PCT: 9 % (ref 3–12)
Monocytes Absolute: 0.6 10*3/uL (ref 0.1–1.0)
NEUTROS ABS: 4.4 10*3/uL (ref 1.7–7.7)
NEUTROS PCT: 63 % (ref 43–77)
Platelets: 205 10*3/uL (ref 150–400)
RBC: 4.33 MIL/uL (ref 3.87–5.11)
RDW: 12.9 % (ref 11.5–15.5)
WBC: 7.1 10*3/uL (ref 4.0–10.5)

## 2014-06-23 LAB — COMPREHENSIVE METABOLIC PANEL
ALBUMIN: 3.9 g/dL (ref 3.5–5.2)
ALK PHOS: 76 U/L (ref 39–117)
ALT: 14 U/L (ref 0–35)
ANION GAP: 12 (ref 5–15)
AST: 19 U/L (ref 0–37)
BILIRUBIN TOTAL: 0.4 mg/dL (ref 0.3–1.2)
BUN: 14 mg/dL (ref 6–23)
CHLORIDE: 104 meq/L (ref 96–112)
CO2: 25 meq/L (ref 19–32)
Calcium: 9.6 mg/dL (ref 8.4–10.5)
Creatinine, Ser: 0.75 mg/dL (ref 0.50–1.10)
GFR calc Af Amer: >90 mL/min (ref >90)
GFR calc non Af Amer: 85 mL/min — ABNORMAL LOW (ref >90)
Glucose, Bld: 104 mg/dL — ABNORMAL HIGH (ref 70–99)
POTASSIUM: 4.4 meq/L (ref 3.7–5.3)
Sodium: 141 mEq/L (ref 137–147)
Total Protein: 7.4 g/dL (ref 6.0–8.3)

## 2014-06-23 NOTE — Progress Notes (Signed)
Labs for cea,cbcd,cmp 

## 2014-06-23 NOTE — Patient Instructions (Signed)
[facility] Discharge Instructions  RECOMMENDATIONS MADE BY THE CONSULTANT AND ANY TEST RESULTS WILL BE SENT TO YOUR REFERRING PHYSICIAN.  Labs in 3 months. Return in 3 months for follow-up Labs from today are pending and we will call you with those results.  We will refer you to Dr. Laural Golden for consideration of future colonoscopy CT abd/pelvis in June 2016.   NCCN guidelines for surveillance for T3/T4, N0-2, M0 colorectal cancer are:   A. Office visit every 3-6 months x 2 years and then every 6 months for a total of 5 years   B. CEA every 3 months x 2 years and then every 6 months for a total of 5 years   C. CT abd/pelvis annually for up to 5 years   D. Colonoscopy in 1 year except if no preoperative colonoscopy due to obstructing lesion, colonoscopy in 3-6 months.    1. If advanced adenoma, repeat in 1 year   2. If no advanced adenoma, repeat in 3 years, then every 5 years  E. PET scan not routinely recommended.   Thank you for choosing [facility] to provide your oncology and hematology care.  To afford each patient quality time with our providers, please arrive at least 15 minutes before your scheduled appointment time.  With your help, our goal is to use those 15 minutes to complete the necessary work-up to ensure our physicians have the information they need to help with your evaluation and healthcare recommendations.    Effective January 1st, 2014, we ask that you re-schedule your appointment with our physicians should you arrive 10 or more minutes late for your appointment.  We strive to give you quality time with our providers, and arriving late affects you and other patients whose appointments are after yours.    Again, thank you for choosing [facility].  Our hope is that these requests will decrease the amount of time that you wait before being seen by our physicians.        _____________________________________________________________  Should you have questions after your visit to [facility], please contact our office at [phone] between the hours of 8:30 a.m. and 5:00 p.m.  Voicemails left after 4:30 p.m. will not be returned until the following business day.  For prescription refill requests, have your pharmacy contact our office with your prescription refill request.

## 2014-06-23 NOTE — Telephone Encounter (Signed)
Notified pt that CBC and CMP were normal.

## 2014-06-23 NOTE — Telephone Encounter (Signed)
-----   Message from [facility], PA-C sent at 06/23/2014  1:54 PM EST ----- Good. CEA is pending.

## 2014-06-24 ENCOUNTER — Telehealth (INDEPENDENT_AMBULATORY_CARE_PROVIDER_SITE_OTHER): Payer: Self-pay | Admitting: *Deleted

## 2014-06-24 LAB — CEA: CEA: 2.7 ng/mL (ref 0.0–5.0)

## 2014-06-24 NOTE — Telephone Encounter (Signed)
Patient had TCS by you in 12/2013 which showed colon ca, we have rec'd a referral from [facility] requesting a TCS, when is patient due -- thanks

## 2014-06-25 ENCOUNTER — Encounter (INDEPENDENT_AMBULATORY_CARE_PROVIDER_SITE_OTHER): Payer: Self-pay

## 2014-06-27 NOTE — Telephone Encounter (Signed)
Records reviewed. Patient called. She can wait until June 2016 before next colonoscopy. The right side of the colon was not examined because of obstructing mass but it was taken out at the time of surgery.

## 2014-06-29 NOTE — Telephone Encounter (Signed)
TCS note for June 2016

## 2014-09-22 ENCOUNTER — Encounter (HOSPITAL_COMMUNITY): Payer: Medicare Other | Attending: Oncology | Admitting: Oncology

## 2014-09-22 ENCOUNTER — Encounter (HOSPITAL_BASED_OUTPATIENT_CLINIC_OR_DEPARTMENT_OTHER): Payer: Medicare Other

## 2014-09-22 ENCOUNTER — Encounter (HOSPITAL_COMMUNITY): Payer: Self-pay | Admitting: Oncology

## 2014-09-22 VITALS — BP 125/65 | HR 72 | Temp 98.6°F | Resp 18 | Wt 169.0 lb

## 2014-09-22 DIAGNOSIS — Z9049 Acquired absence of other specified parts of digestive tract: Secondary | ICD-10-CM | POA: Diagnosis not present

## 2014-09-22 DIAGNOSIS — D500 Iron deficiency anemia secondary to blood loss (chronic): Secondary | ICD-10-CM | POA: Diagnosis not present

## 2014-09-22 DIAGNOSIS — C184 Malignant neoplasm of transverse colon: Secondary | ICD-10-CM

## 2014-09-22 DIAGNOSIS — C189 Malignant neoplasm of colon, unspecified: Secondary | ICD-10-CM

## 2014-09-22 DIAGNOSIS — Z9221 Personal history of antineoplastic chemotherapy: Secondary | ICD-10-CM | POA: Insufficient documentation

## 2014-09-22 DIAGNOSIS — K8689 Other specified diseases of pancreas: Secondary | ICD-10-CM

## 2014-09-22 DIAGNOSIS — K868 Other specified diseases of pancreas: Secondary | ICD-10-CM | POA: Insufficient documentation

## 2014-09-22 LAB — CBC WITH DIFFERENTIAL/PLATELET
BASOS ABS: 0 10*3/uL (ref 0.0–0.1)
Basophils Relative: 1 % (ref 0–1)
EOS PCT: 2 % (ref 0–5)
Eosinophils Absolute: 0.1 10*3/uL (ref 0.0–0.7)
HCT: 41.4 % (ref 36.0–46.0)
Hemoglobin: 14.1 g/dL (ref 12.0–15.0)
LYMPHS PCT: 28 % (ref 12–46)
Lymphs Abs: 2 10*3/uL (ref 0.7–4.0)
MCH: 31.2 pg (ref 26.0–34.0)
MCHC: 34.1 g/dL (ref 30.0–36.0)
MCV: 91.6 fL (ref 78.0–100.0)
Monocytes Absolute: 0.6 10*3/uL (ref 0.1–1.0)
Monocytes Relative: 9 % (ref 3–12)
NEUTROS ABS: 4.5 10*3/uL (ref 1.7–7.7)
Neutrophils Relative %: 60 % (ref 43–77)
PLATELETS: 210 10*3/uL (ref 150–400)
RBC: 4.52 MIL/uL (ref 3.87–5.11)
RDW: 12.6 % (ref 11.5–15.5)
WBC: 7.3 10*3/uL (ref 4.0–10.5)

## 2014-09-22 LAB — COMPREHENSIVE METABOLIC PANEL
ALK PHOS: 65 U/L (ref 39–117)
ALT: 16 U/L (ref 0–35)
ANION GAP: 8 (ref 5–15)
AST: 22 U/L (ref 0–37)
Albumin: 4 g/dL (ref 3.5–5.2)
BILIRUBIN TOTAL: 0.2 mg/dL — AB (ref 0.3–1.2)
BUN: 16 mg/dL (ref 6–23)
CALCIUM: 9.5 mg/dL (ref 8.4–10.5)
CHLORIDE: 103 mmol/L (ref 96–112)
CO2: 29 mmol/L (ref 19–32)
CREATININE: 0.79 mg/dL (ref 0.50–1.10)
GFR calc non Af Amer: 84 mL/min — ABNORMAL LOW (ref >90)
GLUCOSE: 105 mg/dL — AB (ref 70–99)
Potassium: 4.3 mmol/L (ref 3.5–5.1)
Sodium: 140 mmol/L (ref 135–145)
Total Protein: 7.4 g/dL (ref 6.0–8.3)

## 2014-09-22 NOTE — Progress Notes (Signed)
LABS DRAWN

## 2014-09-22 NOTE — Patient Instructions (Addendum)
[place] at [facility] Discharge Instructions  RECOMMENDATIONS MADE BY THE CONSULTANT AND ANY TEST RESULTS WILL BE SENT TO YOUR REFERRING PHYSICIAN.  Return in 3 months for lab work and office visit with Dr. Whitney Muse. CT scan as scheduled in June.  Thank you for choosing [place] at [facility] to provide your oncology and hematology care.  To afford each patient quality time with our provider, please arrive at least 15 minutes before your scheduled appointment time.    You need to re-schedule your appointment should you arrive 10 or more minutes late.  We strive to give you quality time with our providers, and arriving late affects you and other patients whose appointments are after yours.  Also, if you no show three or more times for appointments you may be dismissed from the clinic at the providers discretion.     Again, thank you for choosing [facility].  Our hope is that these requests will decrease the amount of time that you wait before being seen by our physicians.       _____________________________________________________________  Should you have questions after your visit to [facility], please contact our office at [phone] between the hours of 8:30 a.m. and 4:30 p.m.  Voicemails left after 4:30 p.m. will not be returned until the following business day.  For prescription refill requests, have your pharmacy contact our office.

## 2014-09-22 NOTE — Progress Notes (Signed)
Amanda Norris., MD [place] [address]  Adenocarcinoma of colon  Mass of pancreas  CURRENT THERAPY: Surveillance per NCCN guidelines after completing only 3 cycles of FOLFOX chemotherapy due to intolerance.   INTERVAL HISTORY: Amanda Norris 69 y.o. 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 Patient received 3/12 cycles of adjuvant FOLFOX chemotherapy to reduce the risk of recurrence, but she then refused further therapy due to patient reported intolerance.    Adenocarcinoma of colon   12/09/2013 Initial Diagnosis 1. Colon, biopsy, proximal transverse mass INVASIVE ADENOCARCINOMA.   12/29/2013 Definitive Surgery Colon, segmental resection for tumor, right - INVASIVE COLORECTAL ADENOCARCINOMA, 7 CM, EXTENDING INTO PERICOLONIC CONNECTIVE TISSUE. - METASTATIC CARCINOMA IN 6 OF 33 LYMPH NODES (6/33).   02/10/2014 - 03/10/2014 Adjuvant Chemotherapy FOLFOX x 3 cycles   03/11/2014 Adverse Reaction Patient called reporting diarrhea despite dose reduction and she wants to stop therapy.  Patient seen on 03/24/14 to discuss other treatment options and she stands by her decision to stop all therapy.   04/01/2014 Surgery Port-a-cath removal by Dr. Arnoldo Norris.    I personally reviewed and went over laboratory results with the patient.  The results are noted within this dictation.  We again discussed other treatment options. These include the following: 1. 5-FU, leucovorin 2. Xeloda 1000 mg twice a day for 14 days on and 7 days off 3. Hold all adjuvant chemotherapy.  She continues to decline any further intervention.  I reviewed her future surveillance per NCCN guidelines. She is due for repeat CT imaging in June 2016 with labs.  She will also be due for repeat colonoscopy.  She notes, "can you believe that Dr. Laural Norris called me on a Sunday to let me know he will repeat  a colonoscopy after this upcoming CT scan?"      Oncologically, she denies any complaints and ROS questioning is negative.     Past Medical History  Diagnosis Date  . PONV (postoperative nausea and vomiting)   . Colon cancer   . Arthritis     wrist and thumbs    has Mass of pancreas; Adenocarcinoma of colon; and Iron deficiency on her problem list.     is allergic to sulfa antibiotics.  Amanda Norris had no medications administered during this visit.  Past Surgical History  Procedure Laterality Date  . Herniated disc      19 [id number]  . Colonoscopy N/A 12/12/2013    Procedure: COLONOSCOPY;  Surgeon: Rogene Houston, MD;  [facility];  Service: Endoscopy;  Laterality: N/A;  730  . Eus N/A 12/18/2013    Procedure: UPPER ENDOSCOPIC ULTRASOUND (EUS) LINEAR;  Surgeon: Milus Banister, MD;  [facility];  Service: Endoscopy;  Laterality: N/A;  . Partial colectomy N/A 12/29/2013    Procedure: RIGHT HEMICOLECTOMY;  Surgeon: Jamesetta So, MD;  Location: [facility];  Service: General;  Laterality: N/A;  . Portacath placement Right 02/04/2014    Procedure: INSERTION PORT-A-CATH;  Surgeon: Jamesetta So, MD;  Location: [facility];  Service: General;  Laterality: Right;  . Port-a-cath removal Right 04/01/2014    Procedure: MINOR REMOVAL PORT-A-CATH;  Surgeon: Jamesetta So, MD;  Location: [facility];  Service: General;  Laterality: Right;    Denies any headaches, dizziness, double vision, fevers, chills, night sweats, nausea, vomiting, diarrhea, constipation, chest pain, heart palpitations, shortness of breath, blood in stool,  black tarry stool, urinary pain, urinary burning, urinary frequency, hematuria.   PHYSICAL EXAMINATION  ECOG PERFORMANCE STATUS: 0 - Asymptomatic  Filed Vitals:   09/22/14 1023  BP: 125/65  Pulse: 72  Temp: 98.6 F (37 C)  Resp: 18    GENERAL:alert, no distress, well nourished, well developed, comfortable, cooperative and smiling, accompanied by her  husband SKIN: skin color, texture, turgor are normal, no rashes or significant lesions HEAD: Normocephalic, No masses, lesions, tenderness or abnormalities EYES: normal, PERRLA, EOMI, Conjunctiva are pink and non-injected EARS: External ears normal OROPHARYNX:lips, buccal mucosa, and tongue normal and mucous membranes are moist  NECK: supple, no adenopathy, thyroid normal size, non-tender, without nodularity, no stridor, non-tender, trachea midline LYMPH:  no palpable lymphadenopathy BREAST:not examined LUNGS: clear to auscultation and percussion HEART: regular rate & rhythm, no murmurs, no gallops, S1 normal and S2 normal ABDOMEN:abdomen soft, non-tender, normal bowel sounds, no masses or organomegaly and no hepatosplenomegaly BACK: Back symmetric, no curvature., No CVA tenderness EXTREMITIES:less then 2 second capillary refill, no joint deformities, effusion, or inflammation, no edema, no skin discoloration, no clubbing, no cyanosis  NEURO: alert & oriented x 3 with fluent speech, no focal motor/sensory deficits, gait normal   LABORATORY DATA: CBC    Component Value Date/Time   WBC 7.3 09/22/2014 1018   RBC 4.52 09/22/2014 1018   HGB 14.1 09/22/2014 1018   HCT 41.4 09/22/2014 1018   PLT 210 09/22/2014 1018   MCV 91.6 09/22/2014 1018   MCH 31.2 09/22/2014 1018   MCHC 34.1 09/22/2014 1018   RDW 12.6 09/22/2014 1018   LYMPHSABS 2.0 09/22/2014 1018   MONOABS 0.6 09/22/2014 1018   EOSABS 0.1 09/22/2014 1018   BASOSABS 0.0 09/22/2014 1018      Chemistry      Component Value Date/Time   NA 141 06/23/2014 1057   K 4.4 06/23/2014 1057   CL 104 06/23/2014 1057   CO2 25 06/23/2014 1057   BUN 14 06/23/2014 1057   CREATININE 0.75 06/23/2014 1057      Component Value Date/Time   CALCIUM 9.6 06/23/2014 1057   ALKPHOS 76 06/23/2014 1057   AST 19 06/23/2014 1057   ALT 14 06/23/2014 1057   BILITOT 0.4 06/23/2014 1057     Lab Results  Component Value Date   CEA 2.7 06/23/2014       ASSESSMENT AND PLAN:  Adenocarcinoma of colon Stage III-B adenocarcinoma the proximal transverse colon, status post colectomy on 12/29/2013, fair tolerance of adjuvant FOLFOX with diarrhea requiring a 20% dose reduction in 5FU. Despite dose-reduction, patient has decided to forego chemotherapy and she has stopped after 3 cycles of FOLFOX.  DPD Gene mutation was checked and was negative.  The patient was again presented further treatment options which she declined.  CT abd/pelvis scheduled for June 2016.  Continue with labs every three months.  Labs today: CBC diff, CMET, CEA.  She will be due for an upcoming colonoscopy following her June CT imaging and Dr. Laural Norris is already prepared for this procedure as he has contacted her personally recently.  Return in 3 months for follow-up after CT scan to review results.    Mass of pancreas CT scan of abd/pelvis in June 2016.   THERAPY PLAN:  NCCN guidelines for surveillance for T3/T4, N0-2, M0 colorectal cancer are:   A. H+P every 3-6 months x 2 years and then every 6 months for a total of 5 years   B. CEA every 3 months x 2 years  and then every 6 months for a total of 5 years   C. CT abd/pelvis annually for up to 5 years   D. Colonoscopy in 1 year except if no preoperative colonoscopy due to obstructing lesion, colonoscopy in 3-6 months.    1. If advanced adenoma, repeat in 1 year   2. If no advanced adenoma, repeat in 3 years, then every 5 years  E. PET scan not routinely recommended.   All questions were answered. The patient knows to call the clinic with any problems, questions or concerns. We can certainly see the patient much sooner if necessary.  Patient and plan discussed with Dr. Ancil Linsey and she is in agreement with the aforementioned.   This note is electronically signed by: Robynn Pane 09/22/2014 10:39 AM

## 2014-09-22 NOTE — Assessment & Plan Note (Signed)
CT scan of abd/pelvis in June 2016.

## 2014-09-22 NOTE — Assessment & Plan Note (Addendum)
Stage III-B adenocarcinoma the proximal transverse colon, status post colectomy on 12/29/2013, fair tolerance of adjuvant FOLFOX with diarrhea requiring a 20% dose reduction in 5FU. Despite dose-reduction, patient has decided to forego chemotherapy and she has stopped after 3 cycles of FOLFOX.  DPD Gene mutation was checked and was negative.  The patient was again presented further treatment options which she declined.  CT abd/pelvis scheduled for June 2016.  Continue with labs every three months.  Labs today: CBC diff, CMET, CEA.  She will be due for an upcoming colonoscopy following her June CT imaging and Dr. Laural Golden is already prepared for this procedure as he has contacted her personally recently.  Return in 3 months for follow-up after CT scan to review results.

## 2014-09-23 LAB — CEA: CEA: 3.8 ng/mL (ref 0.0–4.7)

## 2014-11-12 DIAGNOSIS — Z0001 Encounter for general adult medical examination with abnormal findings: Secondary | ICD-10-CM | POA: Diagnosis not present

## 2014-11-12 DIAGNOSIS — Z6829 Body mass index (BMI) 29.0-29.9, adult: Secondary | ICD-10-CM | POA: Diagnosis not present

## 2014-11-12 DIAGNOSIS — M5430 Sciatica, unspecified side: Secondary | ICD-10-CM | POA: Diagnosis not present

## 2014-11-12 DIAGNOSIS — E049 Nontoxic goiter, unspecified: Secondary | ICD-10-CM | POA: Diagnosis not present

## 2014-11-12 DIAGNOSIS — E663 Overweight: Secondary | ICD-10-CM | POA: Diagnosis not present

## 2014-11-23 ENCOUNTER — Encounter (INDEPENDENT_AMBULATORY_CARE_PROVIDER_SITE_OTHER): Payer: Self-pay | Admitting: *Deleted

## 2014-12-03 ENCOUNTER — Other Ambulatory Visit (INDEPENDENT_AMBULATORY_CARE_PROVIDER_SITE_OTHER): Payer: Self-pay | Admitting: *Deleted

## 2014-12-03 DIAGNOSIS — Z85038 Personal history of other malignant neoplasm of large intestine: Secondary | ICD-10-CM

## 2014-12-10 ENCOUNTER — Ambulatory Visit (HOSPITAL_COMMUNITY)
Admission: RE | Admit: 2014-12-10 | Discharge: 2014-12-10 | Disposition: A | Discharge status: Home / Self Care | Payer: Medicare Other | Source: Ambulatory Visit | Attending: Oncology | Admitting: Oncology

## 2014-12-10 ENCOUNTER — Telehealth (INDEPENDENT_AMBULATORY_CARE_PROVIDER_SITE_OTHER): Payer: Self-pay | Admitting: *Deleted

## 2014-12-10 DIAGNOSIS — C189 Malignant neoplasm of colon, unspecified: Secondary | ICD-10-CM | POA: Insufficient documentation

## 2014-12-10 DIAGNOSIS — Z9221 Personal history of antineoplastic chemotherapy: Secondary | ICD-10-CM | POA: Diagnosis not present

## 2014-12-10 DIAGNOSIS — Z9889 Other specified postprocedural states: Secondary | ICD-10-CM | POA: Insufficient documentation

## 2014-12-10 DIAGNOSIS — Z1211 Encounter for screening for malignant neoplasm of colon: Secondary | ICD-10-CM

## 2014-12-10 DIAGNOSIS — Z85038 Personal history of other malignant neoplasm of large intestine: Secondary | ICD-10-CM | POA: Diagnosis not present

## 2014-12-10 DIAGNOSIS — K5730 Diverticulosis of large intestine without perforation or abscess without bleeding: Secondary | ICD-10-CM | POA: Diagnosis not present

## 2014-12-10 LAB — POCT I-STAT CREATININE: CREATININE: 0.8 mg/dL (ref 0.44–1.00)

## 2014-12-10 MED ORDER — IOHEXOL 300 MG/ML  SOLN
100.0000 mL | Freq: Once | INTRAMUSCULAR | Status: AC | PRN
  Administered 2014-12-10: 100 mL via INTRAVENOUS

## 2014-12-10 NOTE — Telephone Encounter (Signed)
Patient needs trilyte 

## 2014-12-11 MED ORDER — PEG 3350-KCL-NA BICARB-NACL 420 G PO SOLR: 4000.0000 mL | Freq: Once | ORAL | Status: DC

## 2014-12-24 ENCOUNTER — Telehealth (HOSPITAL_COMMUNITY): Payer: Self-pay

## 2014-12-24 ENCOUNTER — Encounter (HOSPITAL_COMMUNITY): Payer: Self-pay | Admitting: Hematology & Oncology

## 2014-12-24 ENCOUNTER — Encounter (HOSPITAL_BASED_OUTPATIENT_CLINIC_OR_DEPARTMENT_OTHER): Payer: Medicare Other

## 2014-12-24 ENCOUNTER — Encounter (HOSPITAL_COMMUNITY): Payer: Medicare Other | Attending: Hematology & Oncology | Admitting: Hematology & Oncology

## 2014-12-24 VITALS — BP 133/71 | HR 58 | Temp 97.9°F | Resp 20 | Wt 169.9 lb

## 2014-12-24 DIAGNOSIS — C779 Secondary and unspecified malignant neoplasm of lymph node, unspecified: Secondary | ICD-10-CM | POA: Diagnosis not present

## 2014-12-24 DIAGNOSIS — C184 Malignant neoplasm of transverse colon: Secondary | ICD-10-CM | POA: Diagnosis not present

## 2014-12-24 DIAGNOSIS — C189 Malignant neoplasm of colon, unspecified: Secondary | ICD-10-CM | POA: Insufficient documentation

## 2014-12-24 LAB — CBC WITH DIFFERENTIAL/PLATELET
Basophils Absolute: 0 10*3/uL (ref 0.0–0.1)
Basophils Relative: 1 % (ref 0–1)
EOS ABS: 0.3 10*3/uL (ref 0.0–0.7)
EOS PCT: 4 % (ref 0–5)
HCT: 42.3 % (ref 36.0–46.0)
Hemoglobin: 14.1 g/dL (ref 12.0–15.0)
Lymphocytes Relative: 27 % (ref 12–46)
Lymphs Abs: 2.1 10*3/uL (ref 0.7–4.0)
MCH: 30.6 pg (ref 26.0–34.0)
MCHC: 33.3 g/dL (ref 30.0–36.0)
MCV: 91.8 fL (ref 78.0–100.0)
MONO ABS: 0.7 10*3/uL (ref 0.1–1.0)
Monocytes Relative: 9 % (ref 3–12)
Neutro Abs: 4.4 10*3/uL (ref 1.7–7.7)
Neutrophils Relative %: 59 % (ref 43–77)
Platelets: 203 10*3/uL (ref 150–400)
RBC: 4.61 MIL/uL (ref 3.87–5.11)
RDW: 12.7 % (ref 11.5–15.5)
WBC: 7.5 10*3/uL (ref 4.0–10.5)

## 2014-12-24 LAB — COMPREHENSIVE METABOLIC PANEL
ALBUMIN: 4.2 g/dL (ref 3.5–5.0)
ALK PHOS: 62 U/L (ref 38–126)
ALT: 14 U/L (ref 14–54)
AST: 21 U/L (ref 15–41)
Anion gap: 8 (ref 5–15)
BUN: 14 mg/dL (ref 6–20)
CO2: 29 mmol/L (ref 22–32)
Calcium: 9.7 mg/dL (ref 8.9–10.3)
Chloride: 102 mmol/L (ref 101–111)
Creatinine, Ser: 0.71 mg/dL (ref 0.44–1.00)
GFR calc non Af Amer: >60 mL/min (ref >60)
Glucose, Bld: 100 mg/dL — ABNORMAL HIGH (ref 65–99)
Potassium: 4.4 mmol/L (ref 3.5–5.1)
Sodium: 139 mmol/L (ref 135–145)
TOTAL PROTEIN: 7.7 g/dL (ref 6.5–8.1)
Total Bilirubin: 0.5 mg/dL (ref 0.3–1.2)

## 2014-12-24 NOTE — Telephone Encounter (Signed)
Patient is willing to have PET scan done.  Would like for it to be scheduled on any Tuesday but 01/05/15.

## 2014-12-24 NOTE — Patient Instructions (Signed)
[place] at [facility] Discharge Instructions  RECOMMENDATIONS MADE BY THE CONSULTANT AND ANY TEST RESULTS WILL BE SENT TO YOUR REFERRING PHYSICIAN.  Exam and discussion by Dr. Whitney Muse. Call and let us know of your decision.  Mickie Kay, RN  [phone])  Follow-up in 3 months with labs and office visit.  Thank you for choosing [place] at [facility] to provide your oncology and hematology care.  To afford each patient quality time with our provider, please arrive at least 15 minutes before your scheduled appointment time.    You need to re-schedule your appointment should you arrive 10 or more minutes late.  We strive to give you quality time with our providers, and arriving late affects you and other patients whose appointments are after yours.  Also, if you no show three or more times for appointments you may be dismissed from the clinic at the providers discretion.     Again, thank you for choosing [facility].  Our hope is that these requests will decrease the amount of time that you wait before being seen by our physicians.       _____________________________________________________________  Should you have questions after your visit to [facility], please contact our office at [phone] between the hours of 8:30 a.m. and 4:30 p.m.  Voicemails left after 4:30 p.m. will not be returned until the following business day.  For prescription refill requests, have your pharmacy contact our office.

## 2014-12-24 NOTE — Progress Notes (Signed)
Glo Herring., MD [place] [address]  Stage III CRC, Resection on 12/29/2013 Adjuvant FOLFOX  X 3 cycles only  CURRENT THERAPY: Surveillance per NCCN guidelines after completing only 3 cycles of FOLFOX chemotherapy due to intolerance.  INTERVAL HISTORY: Amanda Norris 69 y.o. 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 Patient received 3/12 cycles of adjuvant FOLFOX chemotherapy to reduce the risk of recurrence, but she then refused further therapy due to patient reported intolerance.  She is present today with her husband and says that she is feeling great. Her hobbies include reading and volunteering. She is active. She denies abdominal pain or recent change in bowel habits.  She has a Colonoscopy scheduled 01/20/15.  She has not had a screening mammogram in more than a decade. She states they are too uncomfortable to get. She is here today to review recent CT imaging.     Adenocarcinoma of colon   12/09/2013 Initial Diagnosis 1. Colon, biopsy, proximal transverse mass INVASIVE ADENOCARCINOMA.   12/29/2013 Definitive Surgery Colon, segmental resection for tumor, right - INVASIVE COLORECTAL ADENOCARCINOMA, 7 CM, EXTENDING INTO PERICOLONIC CONNECTIVE TISSUE. - METASTATIC CARCINOMA IN 6 OF 33 LYMPH NODES (6/33).   02/10/2014 - 03/10/2014 Adjuvant Chemotherapy FOLFOX x 3 cycles   03/11/2014 Adverse Reaction Patient called reporting diarrhea despite dose reduction and she wants to stop therapy.  Patient seen on 03/24/14 to discuss other treatment options and she stands by her decision to stop all therapy.   04/01/2014 Surgery Port-a-cath removal by Dr. Arnoldo Morale.      Past Medical History  Diagnosis Date  . PONV (postoperative nausea and vomiting)   . Colon cancer   . Arthritis     wrist and thumbs    has Mass of pancreas; Adenocarcinoma of colon; and  Iron deficiency on her problem list.     is allergic to sulfa antibiotics.  Ms. Behl does not currently have medications on file.  Past Surgical History  Procedure Laterality Date  . Herniated disc      1996-[id number]  . Colonoscopy N/A 12/12/2013    Procedure: COLONOSCOPY;  Surgeon: Rogene Houston, MD;  [facility];  Service: Endoscopy;  Laterality: N/A;  730  . Eus N/A 12/18/2013    Procedure: UPPER ENDOSCOPIC ULTRASOUND (EUS) LINEAR;  Surgeon: Milus Banister, MD;  Location: [facility];  Service: Endoscopy;  Laterality: N/A;  . Partial colectomy N/A 12/29/2013    Procedure: RIGHT HEMICOLECTOMY;  Surgeon: Jamesetta So, MD;  Location: [facility];  Service: General;  Laterality: N/A;  . Portacath placement Right 02/04/2014    Procedure: INSERTION PORT-A-CATH;  Surgeon: Jamesetta So, MD;  Location: [facility];  Service: General;  Laterality: Right;  . Port-a-cath removal Right 04/01/2014    Procedure: MINOR REMOVAL PORT-A-CATH;  Surgeon: Jamesetta So, MD;  Location: [facility];  Service: General;  Laterality: Right;    Denies any headaches, dizziness, double vision, fevers, chills, night sweats, nausea, vomiting, diarrhea, constipation, chest pain, heart palpitations, shortness of breath, blood in stool, black tarry stool, urinary pain, urinary burning, urinary frequency, hematuria. 14 point review of systems was performed and is negative except as detailed under history of present illness and above    PHYSICAL EXAMINATION  ECOG PERFORMANCE STATUS: 0 - Asymptomatic  Filed Vitals:   12/24/14 1100  BP: 133/71  Pulse: 58  Temp: 97.9 F (  36.6 C)  Resp: 20    GENERAL:alert, no distress, well nourished, well developed, comfortable, cooperative and smiling, accompanied by her husband SKIN: skin color, texture, turgor are normal, no rashes or significant lesions HEAD: Normocephalic, No masses, lesions, tenderness or abnormalities EYES: normal, PERRLA, EOMI, Conjunctiva are pink and  non-injected EARS: External ears normal OROPHARYNX:lips, buccal mucosa, and tongue normal and mucous membranes are moist  NECK: supple, no adenopathy, thyroid normal size, non-tender, without nodularity, no stridor, non-tender, trachea midline LYMPH:  no palpable lymphadenopathy BREAST:not examined LUNGS: clear to auscultation and percussion HEART: regular rate & rhythm, no murmurs, no gallops, S1 normal and S2 normal ABDOMEN:abdomen soft, non-tender, normal bowel sounds, no masses or organomegaly and no hepatosplenomegaly BACK: Back symmetric, no curvature., No CVA tenderness EXTREMITIES:less then 2 second capillary refill, no joint deformities, effusion, or inflammation, no edema, no skin discoloration, no clubbing, no cyanosis  NEURO: alert & oriented x 3 with fluent speech, no focal motor/sensory deficits, gait normal   LABORATORY DATA: CBC    Component Value Date/Time   WBC 7.5 12/24/2014 1107   RBC 4.61 12/24/2014 1107   HGB 14.1 12/24/2014 1107   HCT 42.3 12/24/2014 1107   PLT 203 12/24/2014 1107   MCV 91.8 12/24/2014 1107   MCH 30.6 12/24/2014 1107   MCHC 33.3 12/24/2014 1107   RDW 12.7 12/24/2014 1107   LYMPHSABS 2.1 12/24/2014 1107   MONOABS 0.7 12/24/2014 1107   EOSABS 0.3 12/24/2014 1107   BASOSABS 0.0 12/24/2014 1107      Chemistry      Component Value Date/Time   NA 139 12/24/2014 1107   K 4.4 12/24/2014 1107   CL 102 12/24/2014 1107   CO2 29 12/24/2014 1107   BUN 14 12/24/2014 1107   CREATININE 0.71 12/24/2014 1107      Component Value Date/Time   CALCIUM 9.7 12/24/2014 1107   ALKPHOS 62 12/24/2014 1107   AST 21 12/24/2014 1107   ALT 14 12/24/2014 1107   BILITOT 0.5 12/24/2014 1107     Lab Results  Component Value Date   CEA 3.8 09/22/2014   RADIOLOGY:  CLINICAL DATA: Restaging colon cancer. Initial diagnosis May 2015. Patient is status post surgical resection and chemotherapy.  EXAM: CT ABDOMEN AND PELVIS WITH  CONTRAST  TECHNIQUE: Multidetector CT imaging of the abdomen and pelvis was performed using the standard protocol following bolus administration of intravenous contrast.  CONTRAST: 123mL OMNIPAQUE IOHEXOL 300 MG/ML SOLN  COMPARISON: 12/02/2013  FINDINGS: Lower chest: No acute pulmonary findings. No pleural effusion. Stable small basilar pulmonary nodules. The heart is normal in size. No pericardial effusion. Moderate pectus deformity. The distal esophagus is grossly normal.  Hepatobiliary: No focal hepatic lesions to suggest metastatic disease. No biliary dilatation. The gallbladder is normal. No common bile duct dilatation.  Pancreas: No mass lesion, inflammatory changes or ductal dilatation. The small cystic lesions seen on the MRI are not well demonstrated on the CT scan. Stable area of low attenuation in the pancreatic head  Spleen: Normal size. No focal lesions.  Adrenals/Urinary Tract: Normal.  Stomach/Bowel: The stomach, duodenum, small bowel and colon are unremarkable. No inflammatory changes, mass lesions or obstructive findings. Stable scattered sigmoid diverticulosis. Surgical changes from a right colectomy are noted. There are 2 adjacent low-attenuation soft tissue nodules in the central small bowel mesenteric a on image number 37. They measure total of 29 mm transversely and are worrisome for recurrent mesenteric disease.  Vascular/Lymphatic: The aorta and branch vessels are patent. The  major venous structures are patent. No retroperitoneal mass or adenopathy.  Other: The uterus and ovaries are normal. Prominent parametrial vessels bilaterally could suggest pelvic congestion syndrome. The bladder is normal. No pelvic mass, adenopathy or free pelvic fluid collections. No inguinal mass or adenopathy.  Musculoskeletal: No significant bony findings.  IMPRESSION: 1. Status post right hemicolectomy with 2 new adjacent soft tissue nodules in  the small bowel mesenteric worrisome for recurrent disease. PET-CT or biopsy may be indicated. I do not see any definite metastatic hepatic lesions. 2. Stable small pulmonary nodules at the lung bases. 3. Prominent parametrial vessels in the pelvis could suggest pelvic congestion syndrome. 4. Stable area of low attenuation in the pancreatic head without obvious mass. The small pancreatic cysts noted on the prior MRI are not identified on the CT scan.   Electronically Signed  By: Marijo Sanes M.D.  On: 12/10/2014 13:38    ASSESSMENT AND PLAN:  History of STAGE III CRC Surgical Resection Adjuvant FOLFOX X 3 cycles only Abnormal follow-up CT of the abdomen   I reviewed the results of her CT scan with the patient and her husband. I gave her several options including proceeding with a PET/CT scan. I advised her that if the PET/CT is negative that is reassuring that the findings may not be malignancy but certainly not conclusive. I offered her a direct referral to interventional radiology for consideration of biopsy. I also offered her ongoing observation with short interval CT of the abdomen in 3 months. She would like to think on these issues and will call us back with her decision.  I will tentatively schedule her for 3 month follow-up. If she opts to proceed with imaging or biopsy now I will bring her back after to review results and make additional recommendations.  I emphasized the importance of screening mammography. She also wishes to think about this as well. Again reminded her that the goal is to find malignancy before we can feel it or have symptoms from it, finding malignancy early gives [place] the best chance for cure.  All questions were answered. The patient knows to call the clinic with any problems, questions or concerns. We can certainly see the patient much sooner if necessary.   This note is electronically signed.  This document serves as a record of services  personally performed by Ancil Linsey, MD. It was created on her behalf by Janace Hoard, a trained medical scribe. The creation of this record is based on the scribe's personal observations and the provider's statements to them. This document has been checked and approved by the attending provider.  I have reviewed the above documentation for accuracy and completeness, and I agree with the above.  Kelby Fam. Whitney Muse, MD

## 2014-12-25 LAB — CEA: CEA: 4.1 ng/mL (ref 0.0–4.7)

## 2014-12-25 NOTE — Progress Notes (Signed)
Lab draw

## 2014-12-31 ENCOUNTER — Telehealth (INDEPENDENT_AMBULATORY_CARE_PROVIDER_SITE_OTHER): Payer: Self-pay | Admitting: *Deleted

## 2014-12-31 NOTE — Telephone Encounter (Signed)
Referring MD/PCP: fusco   Procedure: tcs  Reason/Indication:  Hx colon ca  Has patient had this procedure before?  Yes, 12/2013 -- epic  If so, when, by whom and where?    Is there a family history of colon cancer?  no  Who?  What age when diagnosed?    Is patient diabetic?   no      Does patient have prosthetic heart valve?  no  Do you have a pacemaker?  no  Has patient ever had endocarditis? no  Has patient had joint replacement within last 12 months?  no  Does patient tend to be constipated or take laxatives? no  Is patient on Coumadin, Plavix and/or Aspirin? yes  Medications: see epic  Allergies: see epic  Medication Adjustment: asa 2 days  Procedure date & time: 01/20/15 at 1030

## 2014-12-31 NOTE — Telephone Encounter (Signed)
agree

## 2015-01-08 ENCOUNTER — Encounter (HOSPITAL_COMMUNITY): Payer: Self-pay | Admitting: Hematology & Oncology

## 2015-01-12 ENCOUNTER — Ambulatory Visit (HOSPITAL_COMMUNITY)
Admission: RE | Admit: 2015-01-12 | Discharge: 2015-01-12 | Disposition: A | Discharge status: Home / Self Care | Payer: Medicare Other | Source: Ambulatory Visit | Attending: Hematology & Oncology | Admitting: Hematology & Oncology

## 2015-01-12 DIAGNOSIS — K5730 Diverticulosis of large intestine without perforation or abscess without bleeding: Secondary | ICD-10-CM | POA: Diagnosis not present

## 2015-01-12 DIAGNOSIS — J8410 Pulmonary fibrosis, unspecified: Secondary | ICD-10-CM | POA: Insufficient documentation

## 2015-01-12 DIAGNOSIS — R590 Localized enlarged lymph nodes: Secondary | ICD-10-CM | POA: Diagnosis not present

## 2015-01-12 DIAGNOSIS — C189 Malignant neoplasm of colon, unspecified: Secondary | ICD-10-CM | POA: Insufficient documentation

## 2015-01-12 DIAGNOSIS — R911 Solitary pulmonary nodule: Secondary | ICD-10-CM | POA: Diagnosis not present

## 2015-01-12 LAB — GLUCOSE, CAPILLARY: Glucose-Capillary: 101 mg/dL — ABNORMAL HIGH (ref 65–99)

## 2015-01-12 MED ORDER — FLUDEOXYGLUCOSE F - 18 (FDG) INJECTION
7.9000 | Freq: Once | INTRAVENOUS | Status: AC | PRN
  Administered 2015-01-12: 7.9 via INTRAVENOUS

## 2015-01-13 ENCOUNTER — Encounter (HOSPITAL_COMMUNITY): Payer: Medicare Other | Attending: Hematology & Oncology | Admitting: Hematology & Oncology

## 2015-01-13 ENCOUNTER — Encounter (HOSPITAL_COMMUNITY): Payer: Self-pay | Admitting: Hematology & Oncology

## 2015-01-13 VITALS — BP 124/49 | HR 58 | Temp 98.5°F | Resp 20 | Wt 171.1 lb

## 2015-01-13 DIAGNOSIS — R935 Abnormal findings on diagnostic imaging of other abdominal regions, including retroperitoneum: Secondary | ICD-10-CM | POA: Diagnosis not present

## 2015-01-13 DIAGNOSIS — C189 Malignant neoplasm of colon, unspecified: Secondary | ICD-10-CM | POA: Insufficient documentation

## 2015-01-13 DIAGNOSIS — Z85038 Personal history of other malignant neoplasm of large intestine: Secondary | ICD-10-CM | POA: Diagnosis not present

## 2015-01-13 DIAGNOSIS — R9389 Abnormal findings on diagnostic imaging of other specified body structures: Secondary | ICD-10-CM

## 2015-01-13 NOTE — Progress Notes (Signed)
Glo Herring., MD [place] [address]  Stage III CRC, Resection on 12/29/2013 Adjuvant FOLFOX  X 3 cycles only  CURRENT THERAPY: Surveillance per NCCN guidelines after completing only 3 cycles of FOLFOX chemotherapy due to intolerance.  INTERVAL HISTORY: Amanda Norris 69 y.o. 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 Patient received 3/12 cycles of adjuvant FOLFOX chemotherapy to reduce the risk of recurrence, but she then refused further therapy due to patient reported intolerance.  She has not had a screening mammogram in more than a decade. She states they are too uncomfortable to get. She had a CT scan that was abnormal. She ultimately did agree to a PET. It has been a month since her CT scan but she has few days she can go for testing and office visits as her husband is a Administrator and gone frequently.  The patient is present today to discuss the results of her PET scan.    She has a colonoscopy scheduled. She expresses concern for possibly having to receive treatment again due to difficult experiences with her previous treatment and states that she would not like to go through it again. The patient's surgery was completed by Dr. Arnoldo Morale. She states that she feels fine and has no complaints at this time. She notes that "i am not afraid to die".      Adenocarcinoma of colon   12/09/2013 Initial Diagnosis 1. Colon, biopsy, proximal transverse mass INVASIVE ADENOCARCINOMA.   12/29/2013 Definitive Surgery Colon, segmental resection for tumor, right - INVASIVE COLORECTAL ADENOCARCINOMA, 7 CM, EXTENDING INTO PERICOLONIC CONNECTIVE TISSUE. - METASTATIC CARCINOMA IN 6 OF 33 LYMPH NODES (6/33).   02/10/2014 - 03/10/2014 Adjuvant Chemotherapy FOLFOX x 3 cycles   03/11/2014 Adverse Reaction Patient called reporting diarrhea despite dose reduction and she  wants to stop therapy.  Patient seen on 03/24/14 to discuss other treatment options and she stands by her decision to stop all therapy.   04/01/2014 Surgery Port-a-cath removal by Dr. Arnoldo Morale.      Past Medical History  Diagnosis Date  . PONV (postoperative nausea and vomiting)   . Colon cancer   . Arthritis     wrist and thumbs    has Mass of pancreas; Adenocarcinoma of colon; and Iron deficiency on her problem list.     is allergic to sulfa antibiotics.  Ms. Wigen does not currently have medications on file.  Past Surgical History  Procedure Laterality Date  . Herniated disc      1996-c5-c7  . Colonoscopy N/A 12/12/2013    Procedure: COLONOSCOPY;  Surgeon: Rogene Houston, MD;  [facility];  Service: Endoscopy;  Laterality: N/A;  730  . Eus N/A 12/18/2013    Procedure: UPPER ENDOSCOPIC ULTRASOUND (EUS) LINEAR;  Surgeon: Milus Banister, MD;  Location: [facility];  Service: Endoscopy;  Laterality: N/A;  . Partial colectomy N/A 12/29/2013    Procedure: RIGHT HEMICOLECTOMY;  Surgeon: Jamesetta So, MD;  Location: [facility];  Service: General;  Laterality: N/A;  . Portacath placement Right 02/04/2014    Procedure: INSERTION PORT-A-CATH;  Surgeon: Jamesetta So, MD;  Location: [facility];  Service: General;  Laterality: Right;  . Port-a-cath removal Right 04/01/2014    Procedure: MINOR REMOVAL PORT-A-CATH;  Surgeon: Jamesetta So, MD;  Location: [facility];  Service: General;  Laterality: Right;    Denies any headaches,  dizziness, double vision, fevers, chills, night sweats, nausea, vomiting, diarrhea, constipation, chest pain, heart palpitations, shortness of breath, blood in stool, black tarry stool, urinary pain, urinary burning, urinary frequency, hematuria. 14 point review of systems was performed and is negative except as detailed under history of present illness and above   PHYSICAL EXAMINATION  ECOG PERFORMANCE STATUS: 0 - Asymptomatic  Filed Vitals:   01/13/15 0827  BP:  124/49  Pulse: 58  Temp: 98.5 F (36.9 C)  Resp: 20    GENERAL:alert, no distress, well nourished, well developed, comfortable, cooperative and smiling, accompanied by her husband SKIN: skin color, texture, turgor are normal, no rashes or significant lesions HEAD: Normocephalic, No masses, lesions, tenderness or abnormalities EYES: normal, PERRLA, EOMI, Conjunctiva are pink and non-injected EARS: External ears normal OROPHARYNX:lips, buccal mucosa, and tongue normal and mucous membranes are moist  NECK: supple, no adenopathy, thyroid normal size, non-tender, without nodularity, no stridor, non-tender, trachea midline LYMPH:  no palpable lymphadenopathy BREAST:not examined LUNGS: clear to auscultation and percussion HEART: regular rate & rhythm, no murmurs, no gallops, S1 normal and S2 normal ABDOMEN:abdomen soft, non-tender, normal bowel sounds, no masses or organomegaly and no hepatosplenomegaly BACK: Back symmetric, no curvature., No CVA tenderness EXTREMITIES:less then 2 second capillary refill, no joint deformities, effusion, or inflammation, no edema, no skin discoloration, no clubbing, no cyanosis  NEURO: alert & oriented x 3 with fluent speech, no focal motor/sensory deficits, gait normal   LABORATORY DATA: CBC    Component Value Date/Time   WBC 7.5 12/24/2014 1107   RBC 4.61 12/24/2014 1107   HGB 14.1 12/24/2014 1107   HCT 42.3 12/24/2014 1107   PLT 203 12/24/2014 1107   MCV 91.8 12/24/2014 1107   MCH 30.6 12/24/2014 1107   MCHC 33.3 12/24/2014 1107   RDW 12.7 12/24/2014 1107   LYMPHSABS 2.1 12/24/2014 1107   MONOABS 0.7 12/24/2014 1107   EOSABS 0.3 12/24/2014 1107   BASOSABS 0.0 12/24/2014 1107      Chemistry      Component Value Date/Time   NA 139 12/24/2014 1107   K 4.4 12/24/2014 1107   CL 102 12/24/2014 1107   CO2 29 12/24/2014 1107   BUN 14 12/24/2014 1107   CREATININE 0.71 12/24/2014 1107      Component Value Date/Time   CALCIUM 9.7 12/24/2014 1107    ALKPHOS 62 12/24/2014 1107   AST 21 12/24/2014 1107   ALT 14 12/24/2014 1107   BILITOT 0.5 12/24/2014 1107     Lab Results  Component Value Date   CEA 4.1 12/24/2014   RADIOLOGY: CLINICAL DATA: Initial treatment strategy for colon adenocarcinoma. Mesenteric nodules seen on recent CT.  EXAM: NUCLEAR MEDICINE PET SKULL BASE TO THIGH  TECHNIQUE: 7.9 mCi F-18 FDG was injected intravenously. Full-ring PET imaging was performed from the skull base to thigh after the radiotracer. CT data was obtained and used for attenuation correction and anatomic localization.  FASTING BLOOD GLUCOSE: Value: 101 mg/dl  COMPARISON: [facility] CT on 12/10/2014  FINDINGS: NECK  No hypermetabolic lymph nodes in the neck.  CHEST  No hypermetabolic mediastinal or hilar nodes.  7 mm pulmonary nodule in the superior segment of the right lower lobe on image 29 shows no associated metabolic activity. This remains too small to accurately characterize by PET. Tiny calcified granuloma seen in the peripheral left upper lobe.  ABDOMEN/PELVIS  No abnormal hypermetabolic activity within the liver, pancreas, adrenal glands, or spleen.  Postop changes again seen from previous right colectomy.  Abdominal mesenteric lymphadenopathy is seen just to the right of midline with largest lymph node measuring 16 mm in short axis on image 129/series 4. The show hypermetabolic activity, with SUV max of 3.0, consistent with metastatic disease. No other hypermetabolic lymph nodes or masses identified within the abdomen or pelvis.  Sigmoid diverticulosis is incidentally noted, however there is no evidence of diverticulitis.  SKELETON  No focal hypermetabolic activity to suggest skeletal metastasis.  IMPRESSION: Mild abdominal mesenteric lymphadenopathy shows hypermetabolic activity, consistent with metastatic disease. No other definite sites of metastatic disease identified.  7 mm indeterminate  pulmonary nodule in the superior segment of the right lower lobe shows no associated metabolic activity, but remains too small to accurately characterize by PET. Recommend continued followup by chest CT in 4-6 months.   Electronically Signed  By: Earle Gell M.D.  On: 01/12/2015 14:26   ASSESSMENT AND PLAN:  History of STAGE III CRC Surgical Resection Adjuvant FOLFOX X 3 cycles only Abnormal follow-up CT of the abdomen Abnormal PET imaging of the abdomen/lung   I reviewed the results of her PET scan with the patient and her husband. She was very reluctant to do anything and I advised her that proceeding without biopsy is not unreasonable if that is her decision.  She is very frustrated and stated several times that it "won't be her cancer" and "i am not afraid to die"  I gave her the option to talk about things with her husband and call us with her decision.  She agreed to biopsy but only with IR. I advised her that I would contact IR to see if they can get a biopsy.  It may take several days to get back to her but if it can be done we will set up the appointment in accordance with their schedule.  I advised her that it may ultimately take a surgical biopsy for diagnosis, she "does not want to go there now"  I also offered repeating her PET in 2 to 3 months.  All questions were answered. The patient knows to call the clinic with any problems, questions or concerns. We can certainly see the patient much sooner if necessary.    This note is electronically signed.  This document serves as a record of services personally performed by Ancil Linsey, MD. It was created on her behalf by Janace Hoard, a trained medical scribe. The creation of this record is based on the scribe's personal observations and the provider's statements to them. This document has been checked and approved by the attending provider.  I have reviewed the above documentation for accuracy and completeness, and I  agree with the above.  Kelby Fam. Whitney Muse, MD

## 2015-01-13 NOTE — Patient Instructions (Addendum)
Dr. Whitney Muse d/ced patient and told her that we would call her for follow up.

## 2015-01-15 ENCOUNTER — Telehealth (HOSPITAL_COMMUNITY): Payer: Self-pay

## 2015-01-15 NOTE — Telephone Encounter (Signed)
Call from patient stating " Dr. Whitney Muse said she would call me the day after I saw her which was on Wednesday to let me know whether or not the radiologist could do the biopsy. Now I find out she's been out of the office.   I have not heard anything and I have a colonoscopy scheduled with Dr. Laural Golden on Wednesday and I don't want to do that prep, then be told that I might need tohave major surgery.  I need to know something first thing Monday morning in order to decide what I'm going to need to do."

## 2015-01-18 ENCOUNTER — Telehealth (HOSPITAL_COMMUNITY): Payer: Self-pay

## 2015-01-18 ENCOUNTER — Telehealth (HOSPITAL_COMMUNITY): Payer: Self-pay | Admitting: *Deleted

## 2015-01-18 NOTE — Telephone Encounter (Signed)
Patient informed regarding Dr. Katrinka Blazing message.  States "If it is still a viable option for [place] to wait and re-examine in a few weeks/months, I would prefer to do that and to go ahead and have my colonoscopy done.  That's if Dr. Whitney Muse thinks that this would be okay."

## 2015-01-18 NOTE — Telephone Encounter (Signed)
-----   Message from Patrici Ranks, MD sent at 01/18/2015  9:02 AM EDT -----   ----- Message -----    From: Jacqulynn Cadet, MD    Sent: 01/18/2015   8:17 AM      To: Patrici Ranks, MD  [employer] [employer], I was out of town last week.  This one could be a challenge deep in the mesentery like that. If it stays the way it looks on that PET, I can get it.  There's a chance when she comes in the bowel will be sitting differently and there won't be a safe approach.   I would certainly be willing to try, if there is no safe window at the time of the biopsy I might have to abort and then her only option would be a laparoscopic approach by surgery.    Please place an order for CT guided biopsy and put in the comments that you discussed with me.  I'll help facilitate.   Thomas Hoff   ----- Message -----    From: Patrici Ranks, MD    Sent: 01/13/2015   9:34 AM      To: Jacqulynn Cadet, MD  HI Myrle Sheng,  Can you all biopsy this?  History of stage III CRC did not complete therapy. CT and PET are both done. Thanks [employer]

## 2015-01-20 ENCOUNTER — Encounter (HOSPITAL_COMMUNITY)
Admission: RE | Disposition: A | Discharge status: Home Health | Payer: Self-pay | Source: Ambulatory Visit | Attending: Internal Medicine

## 2015-01-20 ENCOUNTER — Encounter (HOSPITAL_COMMUNITY): Payer: Self-pay | Admitting: *Deleted

## 2015-01-20 ENCOUNTER — Ambulatory Visit (HOSPITAL_COMMUNITY)
Admission: RE | Admit: 2015-01-20 | Discharge: 2015-01-20 | Disposition: A | Discharge status: Home Health | Payer: Medicare Other | Source: Ambulatory Visit | Attending: Internal Medicine | Admitting: Internal Medicine

## 2015-01-20 DIAGNOSIS — Z9221 Personal history of antineoplastic chemotherapy: Secondary | ICD-10-CM | POA: Insufficient documentation

## 2015-01-20 DIAGNOSIS — Z7982 Long term (current) use of aspirin: Secondary | ICD-10-CM | POA: Diagnosis not present

## 2015-01-20 DIAGNOSIS — Z9049 Acquired absence of other specified parts of digestive tract: Secondary | ICD-10-CM | POA: Insufficient documentation

## 2015-01-20 DIAGNOSIS — Z87891 Personal history of nicotine dependence: Secondary | ICD-10-CM | POA: Diagnosis not present

## 2015-01-20 DIAGNOSIS — M1990 Unspecified osteoarthritis, unspecified site: Secondary | ICD-10-CM | POA: Insufficient documentation

## 2015-01-20 DIAGNOSIS — K5730 Diverticulosis of large intestine without perforation or abscess without bleeding: Secondary | ICD-10-CM | POA: Insufficient documentation

## 2015-01-20 DIAGNOSIS — Z85038 Personal history of other malignant neoplasm of large intestine: Secondary | ICD-10-CM | POA: Insufficient documentation

## 2015-01-20 DIAGNOSIS — Z882 Allergy status to sulfonamides status: Secondary | ICD-10-CM | POA: Diagnosis not present

## 2015-01-20 DIAGNOSIS — K648 Other hemorrhoids: Secondary | ICD-10-CM | POA: Diagnosis not present

## 2015-01-20 DIAGNOSIS — Z08 Encounter for follow-up examination after completed treatment for malignant neoplasm: Secondary | ICD-10-CM | POA: Diagnosis not present

## 2015-01-20 DIAGNOSIS — R590 Localized enlarged lymph nodes: Secondary | ICD-10-CM | POA: Diagnosis not present

## 2015-01-20 DIAGNOSIS — K644 Residual hemorrhoidal skin tags: Secondary | ICD-10-CM | POA: Diagnosis not present

## 2015-01-20 HISTORY — PX: COLONOSCOPY: SHX5424

## 2015-01-20 SURGERY — COLONOSCOPY
Anesthesia: Moderate Sedation

## 2015-01-20 MED ORDER — MEPERIDINE HCL 50 MG/ML IJ SOLN
INTRAMUSCULAR | Status: AC
  Filled 2015-01-20: qty 1

## 2015-01-20 MED ORDER — STERILE WATER FOR IRRIGATION IR SOLN
Status: DC | PRN
  Administered 2015-01-20: 11:00:00

## 2015-01-20 MED ORDER — SODIUM CHLORIDE 0.9 % IV SOLN
INTRAVENOUS | Status: DC
  Administered 2015-01-20: 1000 mL via INTRAVENOUS

## 2015-01-20 MED ORDER — MEPERIDINE HCL 50 MG/ML IJ SOLN
INTRAMUSCULAR | Status: DC | PRN
  Administered 2015-01-20 (×2): 25 mg via INTRAVENOUS

## 2015-01-20 MED ORDER — MIDAZOLAM HCL 5 MG/5ML IJ SOLN
INTRAMUSCULAR | Status: AC
  Filled 2015-01-20: qty 10

## 2015-01-20 MED ORDER — MIDAZOLAM HCL 5 MG/5ML IJ SOLN
INTRAMUSCULAR | Status: DC | PRN
  Administered 2015-01-20 (×3): 2 mg via INTRAVENOUS

## 2015-01-20 NOTE — Discharge Instructions (Signed)
Resume usual medications and high fiber diet. No driving for 24 hours. Colonoscopy, Care After These instructions give you information on caring for yourself after your procedure. Your doctor may also give you more specific instructions. Call your doctor if you have any problems or questions after your procedure. HOME CARE  Do not drive for 24 hours.  Do not sign important papers or use machinery for 24 hours.  You may shower.  You may go back to your usual activities, but go slower for the first 24 hours.  Take rest breaks often during the first 24 hours.  Walk around or use warm packs on your belly (abdomen) if you have belly cramping or gas.  Drink enough fluids to keep your pee (urine) clear or pale yellow.  Resume your normal diet. Avoid heavy or fried foods.  Avoid drinking alcohol for 24 hours or as told by your doctor.  Only take medicines as told by your doctor. If a tissue sample (biopsy) was taken during the procedure:   Do not take aspirin or blood thinners for 7 days, or as told by your doctor.  Do not drink alcohol for 7 days, or as told by your doctor.  Eat soft foods for the first 24 hours. GET HELP IF: You still have a small amount of blood in your poop (stool) 2-3 days after the procedure. GET HELP RIGHT AWAY IF:  You have more than a small amount of blood in your poop.  You see clumps of tissue (blood clots) in your poop.  Your belly is puffy (swollen).  You feel sick to your stomach (nauseous) or throw up (vomit).  You have a fever.  You have belly pain that gets worse and medicine does not help. MAKE SURE YOU:  Understand these instructions.  Will watch your condition.  Will get help right away if you are not doing well or get worse. Document Released: 07/29/2010 Document Revised: 07/01/2013 Document Reviewed: 03/03/2013 [facility] Patient Information 2015 [place], [place]. This information is not intended to replace advice given to you by your  health care provider. Make sure you discuss any questions you have with your health care provider.  High-Fiber Diet Fiber is found in fruits, vegetables, and grains. A high-fiber diet encourages the addition of more whole grains, legumes, fruits, and vegetables in your diet. The recommended amount of fiber for adult males is 38 g per day. For adult females, it is 25 g per day. Pregnant and lactating women should get 28 g of fiber per day. If you have a digestive or bowel problem, ask your caregiver for advice before adding high-fiber foods to your diet. Eat a variety of high-fiber foods instead of only a select few type of foods.  PURPOSE  To increase stool bulk.  To make bowel movements more regular to prevent constipation.  To lower cholesterol.  To prevent overeating. WHEN IS THIS DIET USED?  It may be used if you have constipation and hemorrhoids.  It may be used if you have uncomplicated diverticulosis (intestine condition) and irritable bowel syndrome.  It may be used if you need help with weight management.  It may be used if you want to add it to your diet as a protective measure against atherosclerosis, diabetes, and cancer. SOURCES OF FIBER  Whole-grain breads and cereals.  Fruits, such as apples, oranges, bananas, berries, prunes, and pears.  Vegetables, such as green peas, carrots, sweet potatoes, beets, broccoli, cabbage, spinach, and artichokes.  Legumes, such split peas,  soy, lentils.  Almonds. FIBER CONTENT IN FOODS Starches and Grains / Dietary Fiber (g)  Cheerios, 1 cup / 3 g  Corn Flakes cereal, 1 cup / 0.7 g  Rice crispy treat cereal, 1 cup / 0.3 g  Instant oatmeal (cooked),  cup / 2 g  Frosted wheat cereal, 1 cup / 5.1 g  Brown, long-grain rice (cooked), 1 cup / 3.5 g  White, long-grain rice (cooked), 1 cup / 0.6 g  Enriched macaroni (cooked), 1 cup / 2.5 g Legumes / Dietary Fiber (g)  Baked beans (canned, plain, or vegetarian),  cup / 5.2  g  Kidney beans (canned),  cup / 6.8 g  Pinto beans (cooked),  cup / 5.5 g Breads and Crackers / Dietary Fiber (g)  Plain or honey graham crackers, 2 squares / 0.7 g  Saltine crackers, 3 squares / 0.3 g  Plain, salted pretzels, 10 pieces / 1.8 g  Whole-wheat bread, 1 slice / 1.9 g  White bread, 1 slice / 0.7 g  Raisin bread, 1 slice / 1.2 g  Plain bagel, 3 oz / 2 g  Flour tortilla, 1 oz / 0.9 g  Corn tortilla, 1 small / 1.5 g  Hamburger or hotdog bun, 1 small / 0.9 g Fruits / Dietary Fiber (g)  Apple with skin, 1 medium / 4.4 g  Sweetened applesauce,  cup / 1.5 g  Banana,  medium / 1.5 g  Grapes, 10 grapes / 0.4 g  Orange, 1 small / 2.3 g  Raisin, 1.5 oz / 1.6 g  Melon, 1 cup / 1.4 g Vegetables / Dietary Fiber (g)  Green beans (canned),  cup / 1.3 g  Carrots (cooked),  cup / 2.3 g  Broccoli (cooked),  cup / 2.8 g  Peas (cooked),  cup / 4.4 g  Mashed potatoes,  cup / 1.6 g  Lettuce, 1 cup / 0.5 g  Corn (canned),  cup / 1.6 g  Tomato,  cup / 1.1 g Document Released: 06/26/2005 Document Revised: 12/26/2011 Document Reviewed: 09/28/2011 [employer] Patient Information 2015 [place], [place]. This information is not intended to replace advice given to you by your health care provider. Make sure you discuss any questions you have with your health care provider.

## 2015-01-20 NOTE — H&P (Signed)
Amanda Norris is an 68 y.o. female.   Chief Complaint: Patient is here for colonoscopy. HPI: Asian is 69 year old Caucasian female who was diagnosed with stage III adenocarcinoma of colon In July last year intermittent right hemicolectomy. She see 3 cycles of chemotherapy which was stopped because of side effects. She was restaged last month and she has area of increased activity in mesenteric lymph nodes. She does have disease in liver or elsewhere. She has had intermittent diarrhea since her surgery. She has no more than 4 stools on her worst days. Half of her stools are formed. She denies abdominal pain rectal bleeding anorexia or weight loss. Family history is negative for CRC.  Past Medical History  Diagnosis Date  . PONV (postoperative nausea and vomiting)   . Colon cancer   . Arthritis     wrist and thumbs    Past Surgical History  Procedure Laterality Date  . Herniated disc      1996-c5-c7  . Colonoscopy N/A 12/12/2013    Procedure: COLONOSCOPY;  Surgeon: Rogene Houston, MD;  [facility];  Service: Endoscopy;  Laterality: N/A;  730  . Eus N/A 12/18/2013    Procedure: UPPER ENDOSCOPIC ULTRASOUND (EUS) LINEAR;  Surgeon: Milus Banister, MD;  [facility];  Service: Endoscopy;  Laterality: N/A;  . Partial colectomy N/A 12/29/2013    Procedure: RIGHT HEMICOLECTOMY;  Surgeon: Jamesetta So, MD;  Location: [facility];  Service: General;  Laterality: N/A;  . Portacath placement Right 02/04/2014    Procedure: INSERTION PORT-A-CATH;  Surgeon: Jamesetta So, MD;  Location: [facility];  Service: General;  Laterality: Right;  . Port-a-cath removal Right 04/01/2014    Procedure: MINOR REMOVAL PORT-A-CATH;  Surgeon: Jamesetta So, MD;  Location: [facility];  Service: General;  Laterality: Right;    Family History  Problem Relation Age of Onset  . Diabetes type II Mother   . Dementia Father    Social History:  reports that she quit smoking about 55 years ago. Her smoking use  included Cigarettes. She has a 1.25 pack-year smoking history. She has never used smokeless tobacco. She reports that she does not drink alcohol or use illicit drugs.  Allergies:  Allergies  Allergen Reactions  . Sulfa Antibiotics Other (See Comments)    "Sores in my mouth"    Medications Prior to Admission  Medication Sig Dispense Refill  . aspirin (ASPIRIN EC) 81 MG EC tablet Take 81 mg by mouth daily. Swallow whole.    Marland Kitchen Bioflavonoid Products (ESTER C PO) Take 1,000 mg by mouth daily.    Marland Kitchen CRANBERRY PO Take 1 tablet by mouth daily.     . Garlic 4680 MG CAPS Take 1,000 mg by mouth daily.     Marland Kitchen ibuprofen (ADVIL,MOTRIN) 200 MG tablet Take 400 mg by mouth daily as needed for moderate pain.    . Lactobacillus (ACIDOPHILUS PO) Take 1 tablet by mouth daily.     . Multiple Vitamin (MULTIVITAMIN) tablet Take 1 tablet by mouth daily.    . Omega-3 Fatty Acids (FISH OIL) 1200 MG CAPS Take 2,400 mg by mouth daily.     Marland Kitchen HYDROcodone-acetaminophen (NORCO/VICODIN) 5-325 MG per tablet Take 1 tablet by mouth every 4 (four) hours as needed for moderate pain. 40 tablet 0  . ondansetron (ZOFRAN) 4 MG tablet Take 1 tablet (4 mg total) by mouth every 8 (eight) hours as needed for nausea or vomiting. 30 tablet 2  . polyethylene glycol-electrolytes (NULYTELY/GOLYTELY) 420  G solution Take 4,000 mLs by mouth once. 4000 mL 0  . prochlorperazine (COMPAZINE) 10 MG tablet Take 1 tablet (10 mg total) by mouth every 6 (six) hours as needed for nausea or vomiting. 60 tablet 2    No results found for this or any previous visit (from the past 48 hour(s)). No results found.  ROS  Blood pressure 144/67, pulse 66, temperature 98.5 F (36.9 C), temperature source Oral, resp. rate 17, height 5\' 3"  (1.6 m), weight 168 lb (76.204 kg), SpO2 100 %. Physical Exam  Constitutional: She appears well-developed and well-nourished.  HENT:  Mouth/Throat: Oropharynx is clear and moist.  Eyes: Conjunctivae are normal. No scleral  icterus.  Neck: No thyromegaly present.  Cardiovascular: Normal rate, regular rhythm and normal heart sounds.   No murmur heard. Respiratory: Effort normal and breath sounds normal.  GI:  Midline scar to the right from umblicus Abdomen is soft and nontender without organomegaly or masses.  Musculoskeletal: She exhibits no edema.  Lymphadenopathy:    She has no cervical adenopathy.  Neurological: She is alert.  Skin: Skin is warm and dry.     Assessment/Plan History of stage III colon carcinoma. Status post right hemicolectomy in June 2015. Recent PET scan abnormal suggesting metastatic disease and mesenteric lymph nodes. Surveillance colonoscopy.  REHMAN,NAJEEB U 01/20/2015, 10:29 AM

## 2015-01-20 NOTE — Op Note (Signed)
COLONOSCOPY PROCEDURE REPORT  PATIENT:  Amanda Norris  MR#:  [id number] Birthdate:  1945/07/23, 69 y.o., female Endoscopist:  Dr. Rogene Houston, MD  Procedure Date: 01/20/2015  Procedure:   Colonoscopy  Indications:  Patient is 69 year old Caucasian female 1 underwent right hemicolectomy in July last year for stage III adenocarcinoma: And could not complete June chemotherapy because of side effects. She is returning for surveillance colonoscopy. She has mesenteric adenopathy presumed to be secondary to metastatic disease. She is presently asymptomatic. She does not have any evidence of hepatic disease.  Informed Consent:  The procedure and risks were reviewed with the patient and informed consent was obtained.  Medications:  Demerol 50 mg IV Versed 6 mg IV  Description of procedure:  After a digital rectal exam was performed, that colonoscope was advanced from the anus through the rectum and colon to the area of proximal transverse colon and ileocolonic anastomosis. Short segment of small bowel was also intubated. Code was then slowly withdrawn and mucosal surfaces carefully surveyed utilizing scope tip to flexion to facilitate fold flattening as needed. The scope was pulled down into the rectum where a thorough exam including retroflexion was performed.  Findings:   Prep satisfactory. Normal mucosa of distal small bowel proximal to anastomosis. Wide-open ileocolonic anastomosis without evidence of recurrent disease. Scattered diverticula throughout the colon. Normal rectal mucosa. Small hemorrhoids below the dentate line.   Therapeutic/Diagnostic Maneuvers Performed:   None  Complications:  None  Cecal Withdrawal Time:  8 minutes  Impression:  Normal mucosa of distal small bowel. Wide-open ileocolonic anastomosis. Scattered diverticula involving transverse, descending and sigmoid colon. Small external hemorrhoids.  Recommendations:  Standard instructions given. Next exam in  3 years. Follow-up with Dr. Whitney Muse as planned.  REHMAN,NAJEEB U  01/20/2015 11:07 AM  CC: Dr. Glo Herring., MD & Dr. Rayne Du ref. provider found CC: Dr. Molli Hazard, MD

## 2015-01-21 ENCOUNTER — Encounter (HOSPITAL_COMMUNITY): Payer: Self-pay | Admitting: Internal Medicine

## 2015-01-22 ENCOUNTER — Other Ambulatory Visit (HOSPITAL_COMMUNITY): Payer: Self-pay | Admitting: Hematology & Oncology

## 2015-01-22 DIAGNOSIS — C189 Malignant neoplasm of colon, unspecified: Secondary | ICD-10-CM

## 2015-01-24 ENCOUNTER — Encounter (HOSPITAL_COMMUNITY): Payer: Self-pay | Admitting: Hematology & Oncology

## 2015-03-09 DIAGNOSIS — J029 Acute pharyngitis, unspecified: Secondary | ICD-10-CM | POA: Diagnosis not present

## 2015-03-09 DIAGNOSIS — J309 Allergic rhinitis, unspecified: Secondary | ICD-10-CM | POA: Diagnosis not present

## 2015-03-24 ENCOUNTER — Ambulatory Visit (HOSPITAL_COMMUNITY)
Admission: RE | Admit: 2015-03-24 | Discharge: 2015-03-24 | Disposition: A | Discharge status: Home / Self Care | Payer: Medicare Other | Source: Ambulatory Visit | Attending: Hematology & Oncology | Admitting: Hematology & Oncology

## 2015-03-24 DIAGNOSIS — C189 Malignant neoplasm of colon, unspecified: Secondary | ICD-10-CM | POA: Insufficient documentation

## 2015-03-24 DIAGNOSIS — Z79899 Other long term (current) drug therapy: Secondary | ICD-10-CM | POA: Diagnosis not present

## 2015-03-24 DIAGNOSIS — C778 Secondary and unspecified malignant neoplasm of lymph nodes of multiple regions: Secondary | ICD-10-CM | POA: Diagnosis not present

## 2015-03-24 DIAGNOSIS — Z9049 Acquired absence of other specified parts of digestive tract: Secondary | ICD-10-CM | POA: Diagnosis not present

## 2015-03-24 DIAGNOSIS — R911 Solitary pulmonary nodule: Secondary | ICD-10-CM | POA: Diagnosis not present

## 2015-03-24 LAB — GLUCOSE, CAPILLARY: Glucose-Capillary: 100 mg/dL — ABNORMAL HIGH (ref 65–99)

## 2015-03-24 MED ORDER — FLUDEOXYGLUCOSE F - 18 (FDG) INJECTION
8.4000 | Freq: Once | INTRAVENOUS | Status: DC | PRN
  Administered 2015-03-24: 8.4 via INTRAVENOUS
  Filled 2015-03-24: qty 8.4

## 2015-03-26 ENCOUNTER — Ambulatory Visit (HOSPITAL_COMMUNITY): Payer: Medicare Other | Admitting: Hematology & Oncology

## 2015-03-26 ENCOUNTER — Other Ambulatory Visit (HOSPITAL_COMMUNITY): Payer: Medicare Other

## 2015-03-30 ENCOUNTER — Encounter (HOSPITAL_BASED_OUTPATIENT_CLINIC_OR_DEPARTMENT_OTHER): Payer: Medicare Other

## 2015-03-30 ENCOUNTER — Encounter (HOSPITAL_COMMUNITY): Payer: Medicare Other | Attending: Hematology & Oncology | Admitting: Hematology & Oncology

## 2015-03-30 ENCOUNTER — Other Ambulatory Visit: Payer: Self-pay

## 2015-03-30 ENCOUNTER — Encounter (HOSPITAL_COMMUNITY): Payer: Self-pay | Admitting: Hematology & Oncology

## 2015-03-30 VITALS — BP 132/72 | HR 70 | Temp 99.3°F | Resp 16 | Wt 169.2 lb

## 2015-03-30 DIAGNOSIS — K869 Disease of pancreas, unspecified: Secondary | ICD-10-CM | POA: Diagnosis not present

## 2015-03-30 DIAGNOSIS — K8689 Other specified diseases of pancreas: Secondary | ICD-10-CM

## 2015-03-30 DIAGNOSIS — I493 Ventricular premature depolarization: Secondary | ICD-10-CM | POA: Diagnosis not present

## 2015-03-30 DIAGNOSIS — C189 Malignant neoplasm of colon, unspecified: Secondary | ICD-10-CM | POA: Insufficient documentation

## 2015-03-30 DIAGNOSIS — C184 Malignant neoplasm of transverse colon: Secondary | ICD-10-CM | POA: Diagnosis present

## 2015-03-30 DIAGNOSIS — C779 Secondary and unspecified malignant neoplasm of lymph node, unspecified: Secondary | ICD-10-CM

## 2015-03-30 LAB — CBC WITH DIFFERENTIAL/PLATELET
Basophils Absolute: 0 10*3/uL (ref 0.0–0.1)
Basophils Relative: 0 %
EOS ABS: 0.2 10*3/uL (ref 0.0–0.7)
Eosinophils Relative: 2 %
HCT: 40.5 % (ref 36.0–46.0)
Hemoglobin: 13.5 g/dL (ref 12.0–15.0)
LYMPHS ABS: 2.6 10*3/uL (ref 0.7–4.0)
Lymphocytes Relative: 31 %
MCH: 30.3 pg (ref 26.0–34.0)
MCHC: 33.3 g/dL (ref 30.0–36.0)
MCV: 91 fL (ref 78.0–100.0)
MONO ABS: 0.7 10*3/uL (ref 0.1–1.0)
MONOS PCT: 8 %
Neutro Abs: 4.8 10*3/uL (ref 1.7–7.7)
Neutrophils Relative %: 59 %
PLATELETS: 209 10*3/uL (ref 150–400)
RBC: 4.45 MIL/uL (ref 3.87–5.11)
RDW: 12.7 % (ref 11.5–15.5)
WBC: 8.3 10*3/uL (ref 4.0–10.5)

## 2015-03-30 LAB — COMPREHENSIVE METABOLIC PANEL
ALT: 18 U/L (ref 14–54)
ANION GAP: 7 (ref 5–15)
AST: 24 U/L (ref 15–41)
Albumin: 4.1 g/dL (ref 3.5–5.0)
Alkaline Phosphatase: 63 U/L (ref 38–126)
BUN: 14 mg/dL (ref 6–20)
CO2: 29 mmol/L (ref 22–32)
Calcium: 9.8 mg/dL (ref 8.9–10.3)
Chloride: 104 mmol/L (ref 101–111)
Creatinine, Ser: 0.76 mg/dL (ref 0.44–1.00)
GFR calc non Af Amer: >60 mL/min (ref >60)
Glucose, Bld: 98 mg/dL (ref 65–99)
Potassium: 4.5 mmol/L (ref 3.5–5.1)
SODIUM: 140 mmol/L (ref 135–145)
Total Bilirubin: 0.4 mg/dL (ref 0.3–1.2)
Total Protein: 7.6 g/dL (ref 6.5–8.1)

## 2015-03-30 NOTE — Progress Notes (Signed)
LABS DRAWN

## 2015-03-30 NOTE — Patient Instructions (Signed)
[place] at [facility] Discharge Instructions  RECOMMENDATIONS MADE BY THE CONSULTANT AND ANY TEST RESULTS WILL BE SENT TO YOUR REFERRING PHYSICIAN.  Exam and discussion by Dr. Whitney Muse. Results of PET scan discussed. Will get EKG today Will see you after your PET scan in 3 months.  Thank you for choosing [place] at [facility] to provide your oncology and hematology care.  To afford each patient quality time with our Amanda Norris, please arrive at least 15 minutes before your scheduled appointment time.    You need to re-schedule your appointment should you arrive 10 or more minutes late.  We strive to give you quality time with our providers, and arriving late affects you and other patients whose appointments are after yours.  Also, if you no show three or more times for appointments you may be dismissed from the clinic at the providers discretion.     Again, thank you for choosing [facility].  Our hope is that these requests will decrease the amount of time that you wait before being seen by our physicians.       _____________________________________________________________  Should you have questions after your visit to [facility], please contact our office at [phone] between the hours of 8:30 a.m. and 4:30 p.m.  Voicemails left after 4:30 p.m. will not be returned until the following business day.  For prescription refill requests, have your pharmacy contact our office.

## 2015-03-30 NOTE — Progress Notes (Signed)
Glo Herring., MD [place] [address]  Stage III CRC, Resection on 12/29/2013 Adjuvant FOLFOX  X 3 cycles only  CURRENT THERAPY: Surveillance per NCCN guidelines after completing only 3 cycles of FOLFOX chemotherapy due to intolerance.  INTERVAL HISTORY: Amanda Norris 69 y.o. 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 Patient received 3/12 cycles of adjuvant FOLFOX chemotherapy to reduce the risk of recurrence, but she then refused further therapy due to patient reported intolerance.  She has not had a screening mammogram in more than a decade. She states they are too uncomfortable to get. She had a CT scan that was abnormal. She ultimately did agree to a PET.  The patient states that she has overall been doing well.  She only has pain from her Sciatic nerve.  The results of her recent PET scan were discussed.  She questions the rate of growth of her disease.  She is still against getting a biopsy at this time.  She denies dizziness.  She has 3-4 bowel movements per day that loosen as the day progresses.  She currently takes 1 imodium per day after advisement from her GI, Dr. Laural Golden.         Adenocarcinoma of colon   12/09/2013 Initial Diagnosis 1. Colon, biopsy, proximal transverse mass INVASIVE ADENOCARCINOMA.   12/29/2013 Definitive Surgery Colon, segmental resection for tumor, right - INVASIVE COLORECTAL ADENOCARCINOMA, 7 CM, EXTENDING INTO PERICOLONIC CONNECTIVE TISSUE. - METASTATIC CARCINOMA IN 6 OF 33 LYMPH NODES (6/33).   02/10/2014 - 03/10/2014 Adjuvant Chemotherapy FOLFOX x 3 cycles   03/11/2014 Adverse Reaction Patient called reporting diarrhea despite dose reduction and she wants to stop therapy.  Patient seen on 03/24/14 to discuss other treatment options and she stands by her decision to stop all therapy.   04/01/2014 Surgery Port-a-cath removal  by Dr. Arnoldo Morale.      Past Medical History  Diagnosis Date  . PONV (postoperative nausea and vomiting)   . Colon cancer   . Arthritis     wrist and thumbs    has Mass of pancreas; Adenocarcinoma of colon; and Iron deficiency on her problem list.     is allergic to sulfa antibiotics.  [name] does not currently have medications on file.  Past Surgical History  Procedure Laterality Date  . Herniated disc      1996-c5-c7  . Colonoscopy N/A 12/12/2013    Procedure: COLONOSCOPY;  Surgeon: Rogene Houston, MD;  [facility];  Service: Endoscopy;  Laterality: N/A;  730  . Eus N/A 12/18/2013    Procedure: UPPER ENDOSCOPIC ULTRASOUND (EUS) LINEAR;  Surgeon: Milus Banister, MD;  [facility];  Service: Endoscopy;  Laterality: N/A;  . Partial colectomy N/A 12/29/2013    Procedure: RIGHT HEMICOLECTOMY;  Surgeon: Jamesetta So, MD;  Location: [facility];  Service: General;  Laterality: N/A;  . Portacath placement Right 02/04/2014    Procedure: INSERTION PORT-A-CATH;  Surgeon: Jamesetta So, MD;  Location: [facility];  Service: General;  Laterality: Right;  . Port-a-cath removal Right 04/01/2014    Procedure: MINOR REMOVAL PORT-A-CATH;  Surgeon: Jamesetta So, MD;  Location: [facility];  Service: General;  Laterality: Right;  . Colonoscopy N/A 01/20/2015    Procedure: COLONOSCOPY;  Surgeon: Rogene Houston, MD;  [facility];  Service: Endoscopy;  Laterality: N/A;  1030    Denies  any headaches, dizziness, double vision, fevers, chills, night sweats, nausea, vomiting, diarrhea, constipation, chest pain, heart palpitations, shortness of breath, blood in stool, black tarry stool, urinary pain, urinary burning, urinary frequency, hematuria. 14 point review of systems was performed and is negative except as detailed under history of present illness and above   PHYSICAL EXAMINATION  ECOG PERFORMANCE STATUS: 0 - Asymptomatic  Filed Vitals:   03/30/15 1226  BP: 132/72  Pulse:  70  Temp: 99.3 F (37.4 C)  Resp: 16    GENERAL:alert, no distress, well nourished, well developed, comfortable, cooperative and smiling, accompanied by her husband SKIN: skin color, texture, turgor are normal, no rashes or significant lesions HEAD: Normocephalic, No masses, lesions, tenderness or abnormalities EYES: normal, PERRLA, EOMI, Conjunctiva are pink and non-injected EARS: External ears normal OROPHARYNX:lips, buccal mucosa, and tongue normal and mucous membranes are moist  NECK: supple, no adenopathy, thyroid normal size, non-tender, without nodularity, no stridor, non-tender, trachea midline LYMPH:  no palpable lymphadenopathy BREAST:not examined LUNGS: clear to auscultation and percussion HEART: S1/S2 audible, occasional pause apprecitated ABDOMEN:abdomen soft, non-tender, normal bowel sounds, no masses or organomegaly and no hepatosplenomegaly BACK: Back symmetric, no curvature., No CVA tenderness EXTREMITIES:less then 2 second capillary refill, no joint deformities, effusion, or inflammation, no edema, no skin discoloration, no clubbing, no cyanosis  NEURO: alert & oriented x 3 with fluent speech, no focal motor/sensory deficits, gait normal   LABORATORY DATA: I have reviewed the data as listed.  CBC    Component Value Date/Time   WBC 8.3 03/30/2015 1221   RBC 4.45 03/30/2015 1221   HGB 13.5 03/30/2015 1221   HCT 40.5 03/30/2015 1221   PLT 209 03/30/2015 1221   MCV 91.0 03/30/2015 1221   MCH 30.3 03/30/2015 1221   MCHC 33.3 03/30/2015 1221   RDW 12.7 03/30/2015 1221   LYMPHSABS 2.6 03/30/2015 1221   MONOABS 0.7 03/30/2015 1221   EOSABS 0.2 03/30/2015 1221   BASOSABS 0.0 03/30/2015 1221      Chemistry      Component Value Date/Time   NA 140 03/30/2015 1221   K 4.5 03/30/2015 1221   CL 104 03/30/2015 1221   CO2 29 03/30/2015 1221   BUN 14 03/30/2015 1221   CREATININE 0.76 03/30/2015 1221      Component Value Date/Time   CALCIUM 9.8 03/30/2015 1221    ALKPHOS 63 03/30/2015 1221   AST 24 03/30/2015 1221   ALT 18 03/30/2015 1221   BILITOT 0.4 03/30/2015 1221     Lab Results  Component Value Date   CEA 4.1 12/24/2014   RADIOLOGY:  CLINICAL DATA: Subsequent treatment strategy for colon cancer.  EXAM: NUCLEAR MEDICINE PET SKULL BASE TO THIGH  TECHNIQUE: 8.4 mCi F-18 FDG was injected intravenously. Full-ring PET imaging was performed from the skull base to thigh after the radiotracer. CT data was obtained and used for attenuation correction and anatomic localization.  FASTING BLOOD GLUCOSE: Value: 100 mg/dl  COMPARISON: 01/11/2005  FINDINGS: NECK  14 mm short axis node at the left thoracic inlet (series 4/ image 47), max SUV 5.3, new.  CHEST  No hypermetabolic mediastinal or hilar nodes.  Stable 6 x 6 mm posterior right lower lobe pulmonary nodule (series 6/ image 34), essentially non FDG avid with max SUV 1.5, although below the size threshold for PET sensitivity. This is unchanged since 12/31/2013.  ABDOMEN/PELVIS  No abnormal hypermetabolic activity within the liver, pancreas, adrenal glands, or spleen.  Status post right hemicolectomy.  Two hypermetabolic  lymph nodes in the anterior mid abdominal mesenteric, measuring up to 16 mm short axis (series 4/ image 125), previously 14 mm. Max SUV 5.0, previously 3.0.  SKELETON  No focal hypermetabolic activity to suggest skeletal metastasis.  IMPRESSION: Status post right hemicolectomy.  Mild progression of nodal metastases in the anterior mid abdominal mesentery. New nodal metastasis at the left thoracic inlet.  Stable 6 mm nodule in the posterior right lower lobe, grossly unchanged since 2015, technically indeterminate but favored to be benign.   Electronically Signed  By: Julian Hy M.D.  On: 03/24/2015 12:32  ASSESSMENT AND PLAN:  History of STAGE III CRC Surgical Resection Adjuvant FOLFOX X 3 cycles  only Abnormal follow-up CT of the abdomen Abnormal PET imaging of the abdomen/lung   I reviewed the results of her PET scan with the patient, her husband and son.  She has again opted not to proceed with biopsy. She understands that the results of the scan show progression and a "new nodal metastases.' She again emphasizes that even if it were the [place] she had too much difficulty with chemotherapy.  We discussed this today.  She would like to proceed with another PET in 3 months. I advised her that if there is additional progression on repeat imaging and she continues to decline biopsy I would not recommend further imaging. I advised her that only make sense to continue with imaging studies if she had some point is going to agree to biopsy and potentially moving forward with diagnosis and or treatment.  EKG was obtained today which showed NSR.  All questions were answered. The patient knows to call the clinic with any problems, questions or concerns. We can certainly see the patient much sooner if necessary.    This note is electronically signed.  This document serves as a record of services personally performed by Ancil Linsey, MD. It was created on her behalf by Janace Hoard, a trained medical scribe. The creation of this record is based on the scribe's personal observations and the provider's statements to them. This document has been checked and approved by the attending provider.  I have reviewed the above documentation for accuracy and completeness, and I agree with the above.  Amanda Fam. Whitney Muse, MD

## 2015-03-31 LAB — CEA: CEA: 3.6 ng/mL (ref 0.0–4.7)

## 2015-04-01 ENCOUNTER — Encounter (HOSPITAL_COMMUNITY): Payer: Self-pay | Admitting: Hematology & Oncology

## 2015-05-24 DIAGNOSIS — E663 Overweight: Secondary | ICD-10-CM | POA: Diagnosis not present

## 2015-05-24 DIAGNOSIS — M545 Low back pain: Secondary | ICD-10-CM | POA: Diagnosis not present

## 2015-05-24 DIAGNOSIS — Z1389 Encounter for screening for other disorder: Secondary | ICD-10-CM | POA: Diagnosis not present

## 2015-05-24 DIAGNOSIS — M47816 Spondylosis without myelopathy or radiculopathy, lumbar region: Secondary | ICD-10-CM | POA: Diagnosis not present

## 2015-05-24 DIAGNOSIS — C189 Malignant neoplasm of colon, unspecified: Secondary | ICD-10-CM | POA: Diagnosis not present

## 2015-05-24 DIAGNOSIS — M79606 Pain in leg, unspecified: Secondary | ICD-10-CM | POA: Diagnosis not present

## 2015-05-24 DIAGNOSIS — M5430 Sciatica, unspecified side: Secondary | ICD-10-CM | POA: Diagnosis not present

## 2015-05-24 DIAGNOSIS — Z6829 Body mass index (BMI) 29.0-29.9, adult: Secondary | ICD-10-CM | POA: Diagnosis not present

## 2015-06-17 DIAGNOSIS — G894 Chronic pain syndrome: Secondary | ICD-10-CM | POA: Diagnosis not present

## 2015-06-17 DIAGNOSIS — Z1389 Encounter for screening for other disorder: Secondary | ICD-10-CM | POA: Diagnosis not present

## 2015-06-17 DIAGNOSIS — Z6829 Body mass index (BMI) 29.0-29.9, adult: Secondary | ICD-10-CM | POA: Diagnosis not present

## 2015-06-17 DIAGNOSIS — M5432 Sciatica, left side: Secondary | ICD-10-CM | POA: Diagnosis not present

## 2015-06-22 ENCOUNTER — Ambulatory Visit (HOSPITAL_COMMUNITY)
Admission: RE | Admit: 2015-06-22 | Discharge: 2015-06-22 | Disposition: A | Discharge status: Home / Self Care | Payer: Medicare Other | Source: Ambulatory Visit | Attending: Hematology & Oncology | Admitting: Hematology & Oncology

## 2015-06-22 DIAGNOSIS — K8689 Other specified diseases of pancreas: Secondary | ICD-10-CM

## 2015-06-22 DIAGNOSIS — C189 Malignant neoplasm of colon, unspecified: Secondary | ICD-10-CM | POA: Diagnosis not present

## 2015-06-22 DIAGNOSIS — I493 Ventricular premature depolarization: Secondary | ICD-10-CM

## 2015-06-22 DIAGNOSIS — R911 Solitary pulmonary nodule: Secondary | ICD-10-CM | POA: Diagnosis not present

## 2015-06-22 DIAGNOSIS — R590 Localized enlarged lymph nodes: Secondary | ICD-10-CM | POA: Diagnosis not present

## 2015-06-22 DIAGNOSIS — M5116 Intervertebral disc disorders with radiculopathy, lumbar region: Secondary | ICD-10-CM | POA: Diagnosis not present

## 2015-06-22 DIAGNOSIS — Z79899 Other long term (current) drug therapy: Secondary | ICD-10-CM | POA: Diagnosis not present

## 2015-06-22 DIAGNOSIS — M4726 Other spondylosis with radiculopathy, lumbar region: Secondary | ICD-10-CM | POA: Diagnosis not present

## 2015-06-22 LAB — GLUCOSE, CAPILLARY: Glucose-Capillary: 110 mg/dL — ABNORMAL HIGH (ref 65–99)

## 2015-06-22 MED ORDER — FLUDEOXYGLUCOSE F - 18 (FDG) INJECTION
8.4000 | Freq: Once | INTRAVENOUS | Status: AC | PRN
  Administered 2015-06-22: 8.4 via INTRAVENOUS

## 2015-06-25 NOTE — Progress Notes (Signed)
Glo Herring., MD [place] [address]  Stage III CRC, Resection on 12/29/2013 Adjuvant FOLFOX  X 3 cycles only  CURRENT THERAPY: Surveillance per NCCN guidelines after completing only 3 cycles of FOLFOX chemotherapy due to intolerance.  INTERVAL HISTORY: Amanda Norris 69 y.o. 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 Patient received 3/12 cycles of adjuvant FOLFOX chemotherapy to reduce the risk of recurrence, but she then refused further therapy due to patient reported intolerance.  She has not had a screening mammogram in more than a decade. She states they are too uncomfortable to get. She had a CT scan that was abnormal. She ultimately did agree to a PET.  Pt has MRI at [facility] secondary to LBP. Neurotin for sciatic nerve pain in left leg. Several years ago she also had the sciatic pain.    Weight is good. Appetite is good.  Bowels good.  Denies N/V/D.  No abdominal pain. Reviewed PET scan results.   Pt here with son and husband. Pt pleasant and looks good. Pt is still active.   When pt gets up her big toe is tingling then it moves up her leg.    Her daughter from [place] is coming down for Christmas.  No other major complaints or concerns today.     Adenocarcinoma of colon ([place])   12/09/2013 Initial Diagnosis 1. Colon, biopsy, proximal transverse mass INVASIVE ADENOCARCINOMA.   12/29/2013 Definitive Surgery Colon, segmental resection for tumor, right - INVASIVE COLORECTAL ADENOCARCINOMA, 7 CM, EXTENDING INTO PERICOLONIC CONNECTIVE TISSUE. - METASTATIC CARCINOMA IN 6 OF 33 LYMPH NODES (6/33).   02/10/2014 - 03/10/2014 Adjuvant Chemotherapy FOLFOX x 3 cycles   03/11/2014 Adverse Reaction Patient called reporting diarrhea despite dose reduction and she wants to stop therapy.  Patient seen on 03/24/14 to discuss other treatment  options and she stands by her decision to stop all therapy.   04/01/2014 Surgery Port-a-cath removal by Dr. Arnoldo Morale.      Past Medical History  Diagnosis Date  . PONV (postoperative nausea and vomiting)   . Colon cancer ([place])   . Arthritis     wrist and thumbs  . Sciatica of left side     has Mass of pancreas; Adenocarcinoma of colon ([place]); and Iron deficiency on her problem list.     is allergic to sulfa antibiotics.  Amanda Norris does not currently have medications on file.  Past Surgical History  Procedure Laterality Date  . Herniated disc      [id number]  . Colonoscopy N/A 12/12/2013    Procedure: COLONOSCOPY;  Surgeon: Rogene Houston, MD;  [facility];  Service: Endoscopy;  Laterality: N/A;  730  . Eus N/A 12/18/2013    Procedure: UPPER ENDOSCOPIC ULTRASOUND (EUS) LINEAR;  Surgeon: Milus Banister, MD;  Location: [facility];  Service: Endoscopy;  Laterality: N/A;  . Partial colectomy N/A 12/29/2013    Procedure: RIGHT HEMICOLECTOMY;  Surgeon: Jamesetta So, MD;  Location: [facility] OR[facility];  Service: General;  Laterality: N/A;  . Portacath placement Right 02/04/2014    Procedure: INSERTION PORT-A-CATH;  Surgeon: Jamesetta So, MD;  Location: [facility];  Service: General;  Laterality: Right;  . Port-a-cath removal Right 04/01/2014    Procedure: MINOR REMOVAL PORT-A-CATH;  Surgeon: Jamesetta So, MD;  Location: [facility];  Service: General;  Laterality: Right;  . Colonoscopy N/A  01/20/2015    Procedure: COLONOSCOPY;  Surgeon: Rogene Houston, MD;  [facility];  Service: Endoscopy;  Laterality: N/A;  1030    Denies any headaches, dizziness, double vision, fevers, chills, night sweats, nausea, vomiting, diarrhea, constipation, chest pain, heart palpitations, shortness of breath, blood in stool, black tarry stool, urinary pain, urinary burning, urinary frequency, hematuria. 14 point review of systems was performed and is negative except as detailed under history of present  illness and above   PHYSICAL EXAMINATION  ECOG PERFORMANCE STATUS: 0 - Asymptomatic  Filed Vitals:   06/29/15 1355  BP: 147/73  Pulse: 60  Temp: 98.4 F (36.9 C)  Resp: 14    GENERAL:alert, no distress, well nourished, well developed, comfortable, cooperative and smiling, accompanied by her husband SKIN: skin color, texture, turgor are normal, no rashes or significant lesions HEAD: Normocephalic, No masses, lesions, tenderness or abnormalities EYES: normal, PERRLA, EOMI, Conjunctiva are pink and non-injected EARS: External ears normal OROPHARYNX:lips, buccal mucosa, and tongue normal and mucous membranes are moist  NECK: supple, no adenopathy, thyroid normal size, non-tender, without nodularity, no stridor, non-tender, trachea midline LYMPH:  no palpable lymphadenopathy BREAST:not examined LUNGS: clear to auscultation and percussion HEART: S1/S2 audible, occasional pause apprecitated ABDOMEN:abdomen soft, non-tender, normal bowel sounds, no masses or organomegaly and no hepatosplenomegaly BACK: Back symmetric, no curvature., No CVA tenderness EXTREMITIES:less then 2 second capillary refill, no joint deformities, effusion, or inflammation, no edema, no skin discoloration, no clubbing, no cyanosis  NEURO: alert & oriented x 3 with fluent speech, no focal motor/sensory deficits, gait normal   LABORATORY DATA: I have reviewed the data as listed. Results for Amanda Norris, Amanda Norris (MRN [id number])   Ref. Range 06/29/2015 12:59  Sodium Latest Ref Range: 135-145 mmol/L 138  Potassium Latest Ref Range: 3.5-5.1 mmol/L 3.9  Chloride Latest Ref Range: 101-111 mmol/L 102  CO2 Latest Ref Range: 22-32 mmol/L 29  BUN Latest Ref Range: 6-20 mg/dL 15  Creatinine Latest Ref Range: 0.44-1.00 mg/dL 0.76  Calcium Latest Ref Range: 8.9-10.3 mg/dL 9.7  EGFR (Non-African Amer.) Latest Ref Range: >60 mL/min >60  EGFR (African American) Latest Ref Range: >60 mL/min >60  Glucose Latest Ref Range: 65-99 mg/dL  131 (H)  Anion gap Latest Ref Range: 5-15  7  Alkaline Phosphatase Latest Ref Range: 38-126 U/L 68  Albumin Latest Ref Range: 3.5-5.0 g/dL 4.2  AST Latest Ref Range: 15-41 U/L 22  ALT Latest Ref Range: 14-54 U/L 19  Total Protein Latest Ref Range: 6.5-8.1 g/dL 7.7  Total Bilirubin Latest Ref Range: 0.3-1.2 mg/dL 0.4  WBC Latest Ref Range: 4.0-10.5 K/uL 7.6  RBC Latest Ref Range: 3.87-5.11 MIL/uL 4.55  Hemoglobin Latest Ref Range: 12.0-15.0 g/dL 13.7  HCT Latest Ref Range: 36.0-46.0 % 41.7  MCV Latest Ref Range: 78.0-100.0 fL 91.6  MCH Latest Ref Range: 26.0-34.0 pg 30.1  MCHC Latest Ref Range: 30.0-36.0 g/dL 32.9  RDW Latest Ref Range: 11.5-15.5 % 13.1  Platelets Latest Ref Range: 150-400 K/uL 230  Neutrophils Latest Units: % 61  Lymphocytes Latest Units: % 29  Monocytes Relative Latest Units: % 8  Eosinophil Latest Units: % 2  Basophil Latest Units: % 0  NEUT# Latest Ref Range: 1.7-7.7 K/uL 4.7  Lymphocyte # Latest Ref Range: 0.7-4.0 K/uL 2.2  Monocyte # Latest Ref Range: 0.1-1.0 K/uL 0.6  Eosinophils Absolute Latest Ref Range: 0.0-0.7 K/uL 0.1  Basophils Absolute Latest Ref Range: 0.0-0.1 K/uL 0.0  CEA Latest Ref Range: 0.0-4.7 ng/mL 4.2   RADIOLOGY: I  have personally reviewed the radiological images as listed and agreed with the findings in the report.  CLINICAL DATA: Subsequent treatment strategy for metastatic colon carcinoma.  EXAM: NUCLEAR MEDICINE PET SKULL BASE TO THIGH  TECHNIQUE: 8.4 mCi F-18 FDG was injected intravenously. Full-ring PET imaging was performed from the skull base to thigh after the radiotracer. CT data was obtained and used for attenuation correction and anatomic localization.  FASTING BLOOD GLUCOSE: Value: 110 mg/dl  COMPARISON: 03/24/2015  FINDINGS: NECK  9 mm lymph node at the left thoracic inlet on image 51/series 4 has decreased in size from 14 mm previously. Previously seen metabolic activity associated with this lymph  node has now resolved. No hypermetabolic lymphadenopathy identified.  CHEST  No hypermetabolic mediastinal or hilar nodes. 6 mm pulmonary nodule in the posterior right lower lobe on image 25/ series 8 remains stable and shows no associated metabolic activity.  ABDOMEN/PELVIS  No abnormal hypermetabolic activity within the liver, pancreas, adrenal glands, or spleen.  Mid abdominal mesenteric lymphadenopathy remains stable in size, measuring 1.6 cm on image 127/series 4. This remains hypermetabolic, with SUV max of 4.7 compared with 5.0 on previous study. No other hypermetabolic adenopathy identified within the abdomen or pelvis.  SKELETON  No focal hypermetabolic activity to suggest skeletal metastasis.  IMPRESSION: Resolution of metabolic activity and decreased size of mild lymphadenopathy at left thoracic inlet.  Stable mild mid abdominal hypermetabolic mesenteric lymphadenopathy, consistent with metastatic disease.  Stable 6 mm posterior left lower lobe pulmonary nodule, without associated metabolic activity.  No new or progressive disease identified.   Electronically Signed  By: Earle Gell M.D.  On: 06/22/2015 11:34  ASSESSMENT AND PLAN:  History of STAGE III CRC Surgical Resection Adjuvant FOLFOX X 3 cycles only Abnormal follow-up CT of the abdomen Abnormal PET imaging of the abdomen/lung   I reviewed the results of her PET scan with the patient, her husband and son.  She has again opted not to proceed with biopsy, at this point I certainly concur. PET findings are stable.  She is overall doing well and at baseline. We will plan on follow-up in 3 months with repeat PE and ongoing observation. We discuss repeat imaging at follow-up based upon how she is doing clinically.   She has given [place] a copy of her MRI abdomen from [facility].  Patient and family were advised to please call the clinic if they have any questions or concerns  All questions were  answered. The patient knows to call the clinic with any problems, questions or concerns. We can certainly see the patient much sooner if necessary.    This note is electronically signed.  Kelby Fam. Whitney Muse, MD

## 2015-06-29 ENCOUNTER — Encounter (HOSPITAL_COMMUNITY): Payer: Medicare Other | Attending: Hematology & Oncology | Admitting: Hematology & Oncology

## 2015-06-29 ENCOUNTER — Encounter (HOSPITAL_COMMUNITY): Payer: Self-pay | Admitting: Hematology & Oncology

## 2015-06-29 ENCOUNTER — Encounter (HOSPITAL_BASED_OUTPATIENT_CLINIC_OR_DEPARTMENT_OTHER): Payer: Medicare Other

## 2015-06-29 VITALS — BP 147/73 | HR 60 | Temp 98.4°F | Resp 14 | Wt 171.8 lb

## 2015-06-29 DIAGNOSIS — C189 Malignant neoplasm of colon, unspecified: Secondary | ICD-10-CM | POA: Diagnosis not present

## 2015-06-29 DIAGNOSIS — E611 Iron deficiency: Secondary | ICD-10-CM

## 2015-06-29 DIAGNOSIS — C184 Malignant neoplasm of transverse colon: Secondary | ICD-10-CM

## 2015-06-29 DIAGNOSIS — T451X5A Adverse effect of antineoplastic and immunosuppressive drugs, initial encounter: Secondary | ICD-10-CM

## 2015-06-29 DIAGNOSIS — I493 Ventricular premature depolarization: Secondary | ICD-10-CM

## 2015-06-29 DIAGNOSIS — R9389 Abnormal findings on diagnostic imaging of other specified body structures: Secondary | ICD-10-CM

## 2015-06-29 DIAGNOSIS — K521 Toxic gastroenteritis and colitis: Secondary | ICD-10-CM

## 2015-06-29 LAB — COMPREHENSIVE METABOLIC PANEL
ALT: 19 U/L (ref 14–54)
AST: 22 U/L (ref 15–41)
Albumin: 4.2 g/dL (ref 3.5–5.0)
Alkaline Phosphatase: 68 U/L (ref 38–126)
Anion gap: 7 (ref 5–15)
BILIRUBIN TOTAL: 0.4 mg/dL (ref 0.3–1.2)
BUN: 15 mg/dL (ref 6–20)
CALCIUM: 9.7 mg/dL (ref 8.9–10.3)
CO2: 29 mmol/L (ref 22–32)
CREATININE: 0.76 mg/dL (ref 0.44–1.00)
Chloride: 102 mmol/L (ref 101–111)
GFR calc non Af Amer: >60 mL/min (ref >60)
Glucose, Bld: 131 mg/dL — ABNORMAL HIGH (ref 65–99)
Potassium: 3.9 mmol/L (ref 3.5–5.1)
Sodium: 138 mmol/L (ref 135–145)
TOTAL PROTEIN: 7.7 g/dL (ref 6.5–8.1)

## 2015-06-29 LAB — CBC WITH DIFFERENTIAL/PLATELET
BASOS ABS: 0 10*3/uL (ref 0.0–0.1)
Basophils Relative: 0 %
EOS PCT: 2 %
Eosinophils Absolute: 0.1 10*3/uL (ref 0.0–0.7)
HEMATOCRIT: 41.7 % (ref 36.0–46.0)
Hemoglobin: 13.7 g/dL (ref 12.0–15.0)
LYMPHS ABS: 2.2 10*3/uL (ref 0.7–4.0)
LYMPHS PCT: 29 %
MCH: 30.1 pg (ref 26.0–34.0)
MCHC: 32.9 g/dL (ref 30.0–36.0)
MCV: 91.6 fL (ref 78.0–100.0)
Monocytes Absolute: 0.6 10*3/uL (ref 0.1–1.0)
Monocytes Relative: 8 %
NEUTROS ABS: 4.7 10*3/uL (ref 1.7–7.7)
Neutrophils Relative %: 61 %
Platelets: 230 10*3/uL (ref 150–400)
RBC: 4.55 MIL/uL (ref 3.87–5.11)
RDW: 13.1 % (ref 11.5–15.5)
WBC: 7.6 10*3/uL (ref 4.0–10.5)

## 2015-06-29 NOTE — Progress Notes (Signed)
..  Amanda Norris's reason for visit today are for labs as scheduled per MD orders.  Venipuncture performed with a 23 gauge butterfly needle to R Antecubital.  [name] tolerated venipuncture well and without incident; questions were answered and patient was discharged.

## 2015-06-29 NOTE — Patient Instructions (Signed)
[place] at [facility] Discharge Instructions  RECOMMENDATIONS MADE BY THE CONSULTANT AND ANY TEST RESULTS WILL BE SENT TO YOUR REFERRING PHYSICIAN.   Exam completed by Dr Whitney Muse today We are going to look at CD of MRI from [place] group Return to see the doctor in 3 months Please call the clinic if you have any questions or concerns     Thank you for choosing [facility] to provide your oncology and hematology care.  To afford each patient quality time with our provider, please arrive at least 15 minutes before your scheduled appointment time.    You need to re-schedule your appointment should you arrive 10 or more minutes late.  We strive to give you quality time with our providers, and arriving late affects you and other patients whose appointments are after yours.  Also, if you no show three or more times for appointments you may be dismissed from the clinic at the providers discretion.     Again, thank you for choosing [facility].  Our hope is that these requests will decrease the amount of time that you wait before being seen by our physicians.       _____________________________________________________________  Should you have questions after your visit to [facility], please contact our office at [phone] between the hours of 8:30 a.m. and 4:30 p.m.  Voicemails left after 4:30 p.m. will not be returned until the following business day.  For prescription refill requests, have your pharmacy contact our office.

## 2015-06-30 LAB — CEA: CEA: 4.2 ng/mL (ref 0.0–4.7)

## 2015-08-14 ENCOUNTER — Encounter (HOSPITAL_COMMUNITY): Payer: Self-pay | Admitting: Hematology & Oncology

## 2015-08-23 DIAGNOSIS — M25552 Pain in left hip: Secondary | ICD-10-CM | POA: Diagnosis not present

## 2015-08-23 DIAGNOSIS — Z6829 Body mass index (BMI) 29.0-29.9, adult: Secondary | ICD-10-CM | POA: Diagnosis not present

## 2015-08-23 DIAGNOSIS — Z1389 Encounter for screening for other disorder: Secondary | ICD-10-CM | POA: Diagnosis not present

## 2015-08-24 ENCOUNTER — Ambulatory Visit (HOSPITAL_COMMUNITY)
Admission: RE | Admit: 2015-08-24 | Discharge: 2015-08-24 | Disposition: A | Discharge status: Home / Self Care | Payer: Medicare Other | Source: Ambulatory Visit | Attending: Physician Assistant | Admitting: Physician Assistant

## 2015-08-24 ENCOUNTER — Other Ambulatory Visit (HOSPITAL_COMMUNITY): Payer: Self-pay | Admitting: Physician Assistant

## 2015-08-24 DIAGNOSIS — M25552 Pain in left hip: Secondary | ICD-10-CM | POA: Insufficient documentation

## 2015-08-24 DIAGNOSIS — Z1389 Encounter for screening for other disorder: Secondary | ICD-10-CM | POA: Insufficient documentation

## 2015-08-24 DIAGNOSIS — Z6829 Body mass index (BMI) 29.0-29.9, adult: Secondary | ICD-10-CM | POA: Insufficient documentation

## 2015-08-24 DIAGNOSIS — E663 Overweight: Secondary | ICD-10-CM | POA: Insufficient documentation

## 2015-09-29 ENCOUNTER — Other Ambulatory Visit (HOSPITAL_COMMUNITY): Payer: Medicare Other

## 2015-09-29 ENCOUNTER — Encounter (HOSPITAL_COMMUNITY): Payer: Self-pay | Admitting: Hematology & Oncology

## 2015-09-29 ENCOUNTER — Encounter (HOSPITAL_COMMUNITY): Payer: Medicare Other | Attending: Hematology & Oncology | Admitting: Hematology & Oncology

## 2015-09-29 VITALS — BP 131/61 | HR 81 | Temp 99.3°F | Resp 18 | Wt 172.0 lb

## 2015-09-29 DIAGNOSIS — M5432 Sciatica, left side: Secondary | ICD-10-CM | POA: Diagnosis not present

## 2015-09-29 DIAGNOSIS — M79605 Pain in left leg: Secondary | ICD-10-CM

## 2015-09-29 DIAGNOSIS — C184 Malignant neoplasm of transverse colon: Secondary | ICD-10-CM

## 2015-09-29 DIAGNOSIS — Z85038 Personal history of other malignant neoplasm of large intestine: Secondary | ICD-10-CM

## 2015-09-29 DIAGNOSIS — M545 Low back pain: Secondary | ICD-10-CM | POA: Diagnosis not present

## 2015-09-29 DIAGNOSIS — M5442 Lumbago with sciatica, left side: Secondary | ICD-10-CM

## 2015-09-29 DIAGNOSIS — R9389 Abnormal findings on diagnostic imaging of other specified body structures: Secondary | ICD-10-CM

## 2015-09-29 NOTE — Patient Instructions (Addendum)
[place] at [facility] Discharge Instructions  RECOMMENDATIONS MADE BY THE CONSULTANT AND ANY TEST RESULTS WILL BE SENT TO YOUR REFERRING PHYSICIAN.   Exam and discussion by Dr Whitney Muse today Referral to orthopedics, they will call you with the appt  Return to see the doctor in 3 months with labs  Please call the clinic if you have any questions or concerns     Thank you for choosing [place] at [facility] to provide your oncology and hematology care.  To afford each patient quality time with our provider, please arrive at least 15 minutes before your scheduled appointment time.   Beginning January 23rd 2017 lab work for the [facility] will be done in the  Main lab at [facility] on 1st floor. If you have a lab appointment with the [facility] please come in thru the  Main Entrance and check in at the main information desk  You need to re-schedule your appointment should you arrive 10 or more minutes late.  We strive to give you quality time with our providers, and arriving late affects you and other patients whose appointments are after yours.  Also, if you no show three or more times for appointments you may be dismissed from the clinic at the providers discretion.     Again, thank you for choosing [facility].  Our hope is that these requests will decrease the amount of time that you wait before being seen by our physicians.       _____________________________________________________________  Should you have questions after your visit to [facility], please contact our office at [phone] between the hours of 8:30 a.m. and 4:30 p.m.  Voicemails left after 4:30 p.m. will not be returned until the following business day.  For prescription refill requests, have your pharmacy contact our office.         Resources For Cancer Patients and their Caregivers ? American Cancer Society: Can assist with  transportation, wigs, general needs, runs Look Good Feel Better.        513-815-0899 ? Cancer Care: Provides financial assistance, online support groups, medication/co-pay assistance.  1-800-813-HOPE (225) 122-0495) ? [place] Assists [place] cancer patients and their families through emotional , educational and financial support.  (305) 319-9494 ? [place] DSS Where to apply for food stamps, Medicaid and utility assistance. (859) 159-3558 ? [facility]: Transportation to medical appointments. 414-527-0619 ? Social Security Administration: May apply for disability if have a Stage IV cancer. 629 160 3180 912-860-1907 ? [employer], Disability and Transit Services: Assists with nutrition, care and transit needs. (214) 222-1055

## 2015-09-29 NOTE — Progress Notes (Signed)
Amanda Norris., MD [place] [address]  Stage III CRC, Resection on 12/29/2013 Adjuvant FOLFOX  X 3 cycles only  CURRENT THERAPY: Surveillance per NCCN guidelines after completing only 3 cycles of FOLFOX chemotherapy due to intolerance.  INTERVAL HISTORY: Amanda Norris 70 y.o. 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 Patient received 3/12 cycles of adjuvant FOLFOX chemotherapy to reduce the risk of recurrence, but she then refused further therapy due to patient reported intolerance.  She has not had a screening mammogram in more than a decade. She states they are too uncomfortable to get. Amanda Norris was here with her husband today. We have been following her closely secondary to abnormal abdominal imaging. She is currently asymptomatic from an "abdominal" perspective. She has not had a biopsy. Last imaging was on 06/22/2015.   She has been having left leg and foot problems. She says that "sometimes my foot feels like it is literally on fire" She says when her leg hurts it is very weak. She recently fell at work because a jacket got caught on her chair this happened 3 months ago. She said that if she turns her foot too far out and doesn't turn her body it hurts and feels like she pulled a muscle. "I can't spend my life sitting down and I can't go on with this much longer" She said that when her leg is not hurting the world is "right" again.   Her doctor thought that her sciatic nerve pain was because of her arthritis.    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 She can mop the floor and sweep and bend over but when she is upright for long periods of time she starts to hurt. Her husband notes that when they go to [facility] for more than 20 mins she has to sit down.   Her husband notes that she has a good appetite. She says she is trying to lose  some weight.   She says that other than her leg and foot pain she is great. She has not seen orthopedics but is agreeable.   Her weight is stable. She denies nausea or vomiting. Describes her appetite as good. Bowels are unchanged.      Adenocarcinoma of transverse colon ([place])   12/09/2013 Initial Diagnosis 1. Colon, biopsy, proximal transverse mass INVASIVE ADENOCARCINOMA.   12/29/2013 Definitive Surgery Colon, segmental resection for tumor, right - INVASIVE COLORECTAL ADENOCARCINOMA, 7 CM, EXTENDING INTO PERICOLONIC CONNECTIVE TISSUE. - METASTATIC CARCINOMA IN 6 OF 33 LYMPH NODES (6/33).   02/10/2014 - 03/10/2014 Adjuvant Chemotherapy FOLFOX x 3 cycles   03/11/2014 Adverse Reaction Patient called reporting diarrhea despite dose reduction and she wants to stop therapy.  Patient seen on 03/24/14 to discuss other treatment options and she stands by her decision to stop all therapy.   04/01/2014 Surgery Port-a-cath removal by Dr. Arnoldo Morale.      Past Medical History  Diagnosis Date  . PONV (postoperative nausea and vomiting)   . Colon cancer ([place])   . Arthritis     wrist and thumbs  . Sciatica of left side     has Mass of pancreas; Adenocarcinoma of transverse colon ([place]); and Iron deficiency on her problem list.     is allergic to sulfa antibiotics.  Amanda Norris does not currently have medications on file.  Past Surgical History  Procedure Laterality  Date  . Herniated disc      1996-c5-c7  . Colonoscopy N/A 12/12/2013    Procedure: COLONOSCOPY;  Surgeon: Rogene Houston, MD;  [facility];  Service: Endoscopy;  Laterality: N/A;  730  . Eus N/A 12/18/2013    Procedure: UPPER ENDOSCOPIC ULTRASOUND (EUS) LINEAR;  Surgeon: Milus Banister, MD;  [facility];  Service: Endoscopy;  Laterality: N/A;  . Partial colectomy N/A 12/29/2013    Procedure: RIGHT HEMICOLECTOMY;  Surgeon: Jamesetta So, MD;  Location: [facility];  Service: General;  Laterality: N/A;  . Portacath placement Right  02/04/2014    Procedure: INSERTION PORT-A-CATH;  Surgeon: Jamesetta So, MD;  Location: [facility];  Service: General;  Laterality: Right;  . Port-a-cath removal Right 04/01/2014    Procedure: MINOR REMOVAL PORT-A-CATH;  Surgeon: Jamesetta So, MD;  Location: [facility];  Service: General;  Laterality: Right;  . Colonoscopy N/A 01/20/2015    Procedure: COLONOSCOPY;  Surgeon: Rogene Houston, MD;  [facility];  Service: Endoscopy;  Laterality: N/A;  1030   Positive for left leg pain and foot pain. Denies any headaches, dizziness, double vision, fevers, chills, night sweats, nausea, vomiting, diarrhea, constipation, chest pain, heart palpitations, shortness of breath, blood in stool, black tarry stool, urinary pain, urinary burning, urinary frequency, hematuria. 14 point review of systems was performed and is negative except as detailed under history of present illness and above   PHYSICAL EXAMINATION  ECOG PERFORMANCE STATUS: 0 - Asymptomatic  Filed Vitals:   09/29/15 1143  BP: 131/61  Pulse: 81  Temp: 99.3 F (37.4 C)  Resp: 18    GENERAL:alert, no distress, well nourished, well developed, comfortable, cooperative and smiling, accompanied by her husband SKIN: skin color, texture, turgor are normal, no rashes or significant lesions HEAD: Normocephalic, No masses, lesions, tenderness or abnormalities EYES: normal, PERRLA, EOMI, Conjunctiva are pink and non-injected EARS: External ears normal OROPHARYNX:lips, buccal mucosa, and tongue normal and mucous membranes are moist  NECK: supple, no adenopathy, thyroid normal size, non-tender, without nodularity, no stridor, non-tender, trachea midline LYMPH:  no palpable lymphadenopathy BREAST:not examined LUNGS: clear to auscultation and percussion HEART: S1/S2 audible, occasional pause apprecitated ABDOMEN:abdomen soft, non-tender, normal bowel sounds, no masses or organomegaly and no hepatosplenomegaly. No rebound or guarding.  BACK:  Back symmetric, no curvature., No CVA tenderness EXTREMITIES:less then 2 second capillary refill, no joint deformities, effusion, or inflammation, no edema, no skin discoloration, no clubbing, no cyanosis  NEURO: alert & oriented x 3 with fluent speech, no focal motor/sensory deficits, gait normal   LABORATORY DATA: I have reviewed the data as listed. Results for VANESHA, ATHENS (MRN [id number]) as of 09/29/2015 13:10  Ref. Range 06/29/2015 12:59  Sodium Latest Ref Range: 135-145 mmol/L 138  Potassium Latest Ref Range: 3.5-5.1 mmol/L 3.9  Chloride Latest Ref Range: 101-111 mmol/L 102  CO2 Latest Ref Range: 22-32 mmol/L 29  BUN Latest Ref Range: 6-20 mg/dL 15  Creatinine Latest Ref Range: 0.44-1.00 mg/dL 0.76  Calcium Latest Ref Range: 8.9-10.3 mg/dL 9.7  EGFR (Non-African Amer.) Latest Ref Range: >60 mL/min >60  EGFR (African American) Latest Ref Range: >60 mL/min >60  Glucose Latest Ref Range: 65-99 mg/dL 131 (H)  Anion gap Latest Ref Range: 5-15  7  Alkaline Phosphatase Latest Ref Range: 38-126 U/L 68  Albumin Latest Ref Range: 3.5-5.0 g/dL 4.2  AST Latest Ref Range: 15-41 U/L 22  ALT Latest Ref Range: 14-54 U/L 19  Total Protein  Latest Ref Range: 6.5-8.1 g/dL 7.7  Total Bilirubin Latest Ref Range: 0.3-1.2 mg/dL 0.4  WBC Latest Ref Range: 4.0-10.5 K/uL 7.6  RBC Latest Ref Range: 3.87-5.11 MIL/uL 4.55  Hemoglobin Latest Ref Range: 12.0-15.0 g/dL 13.7  HCT Latest Ref Range: 36.0-46.0 % 41.7  MCV Latest Ref Range: 78.0-100.0 fL 91.6  MCH Latest Ref Range: 26.0-34.0 pg 30.1  MCHC Latest Ref Range: 30.0-36.0 g/dL 32.9  RDW Latest Ref Range: 11.5-15.5 % 13.1  Platelets Latest Ref Range: 150-400 K/uL 230  Neutrophils Latest Units: % 61  Lymphocytes Latest Units: % 29  Monocytes Relative Latest Units: % 8  Eosinophil Latest Units: % 2  Basophil Latest Units: % 0  NEUT# Latest Ref Range: 1.7-7.7 K/uL 4.7  Lymphocyte # Latest Ref Range: 0.7-4.0 K/uL 2.2  Monocyte # Latest Ref Range:  0.1-1.0 K/uL 0.6  Eosinophils Absolute Latest Ref Range: 0.0-0.7 K/uL 0.1  Basophils Absolute Latest Ref Range: 0.0-0.1 K/uL 0.0  CEA Latest Ref Range: 0.0-4.7 ng/mL 4.2    RADIOLOGY: I have personally reviewed the radiological images as listed and agreed with the findings in the report.   Study Result     CLINICAL DATA: Fall 6 weeks ago with persistent left hip pain, initial encounter  EXAM: DG HIP (WITH OR WITHOUT PELVIS) 2-3V LEFT  COMPARISON: None.  FINDINGS: No acute fracture or dislocation is noted. Pelvic ring is intact. An exostosis is noted arising from the lateral aspect of the left iliac wing.  IMPRESSION: No acute abnormality noted.   Electronically Signed  By: Inez Catalina M.D.  On: 08/24/2015 08:54    CLINICAL DATA: Subsequent treatment strategy for metastatic colon carcinoma.  EXAM: NUCLEAR MEDICINE PET SKULL BASE TO THIGH  TECHNIQUE: 8.4 mCi F-18 FDG was injected intravenously. Full-ring PET imaging was performed from the skull base to thigh after the radiotracer. CT data was obtained and used for attenuation correction and anatomic localization.  FASTING BLOOD GLUCOSE: Value: 110 mg/dl  COMPARISON: 03/24/2015  FINDINGS: NECK  9 mm lymph node at the left thoracic inlet on image 51/series 4 has decreased in size from 14 mm previously. Previously seen metabolic activity associated with this lymph node has now resolved. No hypermetabolic lymphadenopathy identified.  CHEST  No hypermetabolic mediastinal or hilar nodes. 6 mm pulmonary nodule in the posterior right lower lobe on image 25/ series 8 remains stable and shows no associated metabolic activity.  ABDOMEN/PELVIS  No abnormal hypermetabolic activity within the liver, pancreas, adrenal glands, or spleen.  Mid abdominal mesenteric lymphadenopathy remains stable in size, measuring 1.6 cm on image 127/series 4. This remains hypermetabolic, with SUV max of 4.7  compared with 5.0 on previous study. No other hypermetabolic adenopathy identified within the abdomen or pelvis.  SKELETON  No focal hypermetabolic activity to suggest skeletal metastasis.  IMPRESSION: Resolution of metabolic activity and decreased size of mild lymphadenopathy at left thoracic inlet.  Stable mild mid abdominal hypermetabolic mesenteric lymphadenopathy, consistent with metastatic disease.  Stable 6 mm posterior left lower lobe pulmonary nodule, without associated metabolic activity.  No new or progressive disease identified.   Electronically Signed  By: Earle Gell M.D.  On: 06/22/2015 11:34  ASSESSMENT AND PLAN:  History of STAGE III CRC Surgical Resection Adjuvant FOLFOX X 3 cycles only Abnormal follow-up CT of the abdomen Abnormal PET imaging of the abdomen/lung Left leg/low back pain/sciatica   Clinically she is having low back/leg pain which she describes as sciatic type pain. Last PET in December overall showed stability in abnormal findings  and resolution of adenopathy at the L thoracic inlet. She does not want to pursue another PET at this point. She has refused biopsy to date. She is agreeable to a referral to orthopedics.   She has been cautioned to notify us with any change in her PS, appetite, new pain or decline in energy level.   She will return in 3 months for a follow up and labs. We can discuss repeat imaging at this appointment if she desires. She has previously noted that she could not tolerate therapy at her diagnosis and even if diagnosed with recurrent disease she does not know if she would try therapy again.   All questions were answered. The patient knows to call the clinic with any problems, questions or concerns. We can certainly see the patient much sooner if necessary.    This document serves as a record of services personally performed by Ancil Linsey, MD. It was created on her behalf by Kandace Blitz, a trained  medical scribe. The creation of this record is based on the scribe's personal observations and the provider's statements to them. This document has been checked and approved by the attending provider.  I have reviewed the above documentation for accuracy and completeness, and I agree with the above.  This note is electronically signed.  Kelby Fam. Whitney Muse, MD

## 2015-10-14 ENCOUNTER — Ambulatory Visit (INDEPENDENT_AMBULATORY_CARE_PROVIDER_SITE_OTHER): Payer: Medicare Other

## 2015-10-14 ENCOUNTER — Ambulatory Visit (INDEPENDENT_AMBULATORY_CARE_PROVIDER_SITE_OTHER): Payer: Medicare Other | Admitting: Orthopedic Surgery

## 2015-10-14 ENCOUNTER — Encounter: Payer: Self-pay | Admitting: Orthopedic Surgery

## 2015-10-14 VITALS — BP 162/99 | HR 80 | Temp 97.5°F | Ht 63.0 in | Wt 173.0 lb

## 2015-10-14 DIAGNOSIS — M545 Low back pain, unspecified: Secondary | ICD-10-CM

## 2015-10-14 DIAGNOSIS — M5442 Lumbago with sciatica, left side: Secondary | ICD-10-CM | POA: Diagnosis not present

## 2015-10-14 MED ORDER — IBUPROFEN 800 MG PO TABS: 800.0000 mg | ORAL_TABLET | Freq: Three times a day (TID) | ORAL | Status: DC | PRN

## 2015-10-14 MED ORDER — GABAPENTIN 300 MG PO CAPS: 300.0000 mg | ORAL_CAPSULE | Freq: Three times a day (TID) | ORAL | Status: DC

## 2015-10-14 NOTE — Progress Notes (Signed)
Chief Complaint  Patient presents with  . Sciatica    left side   HPI referring physician Dr. Whitney Muse regular physician Dr. Gerarda Fraction pharmacy [facility] [place]  Today's problem left sciatic nerve pain Several months No injury Pain occurs after activity and during activity primarily standing Pain is described as burning primarily starting in the foot and then radiating up into the hip Symptoms are pain and tingling X-ray of the hip was normal Medication gabapentin 100 mg twice a day It's better when she sits or lies down but is worse when she's standing  She's also on a half a tablet of hydrocodone 5 mg which seems to relieve the pain she took some ibuprofen 200 mg did not leave the pain    Review of Systems  HENT: Positive for tinnitus.   Neurological: Positive for tingling. Negative for focal weakness.  Endo/Heme/Allergies: Positive for environmental allergies.    Past Medical History  Diagnosis Date  . PONV (postoperative nausea and vomiting)   . Colon cancer ([place])   . Arthritis     wrist and thumbs  . Sciatica of left side     Past Surgical History  Procedure Laterality Date  . Herniated disc      1996-c5-c7  . Colonoscopy N/A 12/12/2013    Procedure: COLONOSCOPY;  Surgeon: Rogene Houston, MD;  [facility];  Service: Endoscopy;  Laterality: N/A;  730  . Eus N/A 12/18/2013    Procedure: UPPER ENDOSCOPIC ULTRASOUND (EUS) LINEAR;  Surgeon: Milus Banister, MD;  [facility];  Service: Endoscopy;  Laterality: N/A;  . Partial colectomy N/A 12/29/2013    Procedure: RIGHT HEMICOLECTOMY;  Surgeon: Jamesetta So, MD;  Location: [facility];  Service: General;  Laterality: N/A;  . Portacath placement Right 02/04/2014    Procedure: INSERTION PORT-A-CATH;  Surgeon: Jamesetta So, MD;  Location: [facility];  Service: General;  Laterality: Right;  . Port-a-cath removal Right 04/01/2014    Procedure: MINOR REMOVAL PORT-A-CATH;  Surgeon: Jamesetta So, MD;  Location: [facility];  Service: General;  Laterality: Right;  . Colonoscopy N/A 01/20/2015    Procedure: COLONOSCOPY;  Surgeon: Rogene Houston, MD;  [facility];  Service: Endoscopy;  Laterality: N/A;  1030   Family History  Problem Relation Age of Onset  . Diabetes type II Mother   . Dementia Father    Social History  Substance Use Topics  . Smoking status: Former Smoker -- 0.25 packs/day for 5 years    Types: Cigarettes    Quit date: 04/19/1959  . Smokeless tobacco: Never Used     Comment: smoked for 5 years as teenager  . Alcohol Use: No    Current outpatient prescriptions:  .  aspirin (ASPIRIN EC) 81 MG EC tablet, Take 81 mg by mouth daily. Swallow whole., Disp: , Rfl:  .  Bioflavonoid Products (ESTER C PO), Take 1,000 mg by mouth daily., Disp: , Rfl:  .  cetirizine (ZYRTEC) 10 MG tablet, Take 10 mg by mouth daily., Disp: , Rfl:  .  CRANBERRY PO, Take 1 tablet by mouth daily. , Disp: , Rfl:  .  gabapentin (NEURONTIN) 100 MG capsule, Take 200 mg by mouth 3 (three) times daily. Reported on 06/29/2015, Disp: , Rfl:  .  Garlic 123XX123 MG CAPS, Take 1,000 mg by mouth daily. , Disp: , Rfl:  .  HYDROcodone-acetaminophen (NORCO/VICODIN) 5-325 MG tablet, Take 1 tablet by mouth every 6 (six) hours as needed for moderate pain.  Reported on 09/29/2015, Disp: , Rfl:  .  ibuprofen (ADVIL,MOTRIN) 200 MG tablet, Take 400 mg by mouth daily as needed for moderate pain., Disp: , Rfl:  .  Lactobacillus (ACIDOPHILUS PO), Take 1 tablet by mouth daily. Reported on 09/29/2015, Disp: , Rfl:  .  loperamide (IMODIUM) 2 MG capsule, Take 2 mg by mouth as needed for diarrhea or loose stools., Disp: , Rfl:  .  Multiple Vitamin (MULTIVITAMIN) tablet, Take 1 tablet by mouth daily., Disp: , Rfl:  .  Omega-3 Fatty Acids (FISH OIL) 1200 MG CAPS, Take 2,400 mg by mouth daily. , Disp: , Rfl:   BP 162/99 mmHg  Pulse 80  Temp(Src) 97.5 F (36.4 C) (Tympanic)  Ht 5\' 3"  (1.6 m)  Wt 173 lb (78.472 kg)  BMI 30.65  kg/m2  Physical Exam  Constitutional: She is oriented to person, place, and time. She appears well-developed and well-nourished. No distress.  Cardiovascular: Normal rate and intact distal pulses.   Neurological: She is alert and oriented to person, place, and time. She has normal reflexes. She exhibits normal muscle tone. Coordination normal.  Skin: Skin is warm and dry. No rash noted. She is not diaphoretic. No erythema. No pallor.  Psychiatric: She has a normal mood and affect. Her behavior is normal. Judgment and thought content normal.    Ortho Exam  Lumbar spine is tender in the midline in the lower segment L4-5 and also left SI joint and left gluteal area straight leg raise is negative  In terms of her lower extremities she has normal hip flexion stability and strength in both legs normal skin normal pulses normal sensation normal reflexes at the knee and ankle bilaterally   ASSESSMENT: My personal interpretation of the images:  Pelvis and AP lateral left hip no acute process no significant degenerative changes  I ordered an x-ray of her L-spine    PLAN Half a tablet of hydrocodone every 4 when necessary basis is fine I would like to change her anti-inflammatory Continue her gabapentin at increased dose  And start physical therapy program Follow-up in 2 months

## 2015-10-14 NOTE — Addendum Note (Signed)
Addended by: Baldomero Lamy B on: 10/14/2015 05:15 PM   Modules accepted: Orders

## 2015-10-14 NOTE — Patient Instructions (Addendum)
Start PT  Sciatica Sciatica is pain, weakness, numbness, or tingling along the path of the sciatic nerve. The nerve starts in the lower back and runs down the back of each leg. The nerve controls the muscles in the lower leg and in the back of the knee, while also providing sensation to the back of the thigh, lower leg, and the sole of your foot. Sciatica is a symptom of another medical condition. For instance, nerve damage or certain conditions, such as a herniated disk or bone spur on the spine, pinch or put pressure on the sciatic nerve. This causes the pain, weakness, or other sensations normally associated with sciatica. Generally, sciatica only affects one side of the body. CAUSES   Herniated or slipped disc.  Degenerative disk disease.  A pain disorder involving the narrow muscle in the buttocks (piriformis syndrome).  Pelvic injury or fracture.  Pregnancy.  Tumor (rare). SYMPTOMS  Symptoms can vary from mild to very severe. The symptoms usually travel from the low back to the buttocks and down the back of the leg. Symptoms can include:  Mild tingling or dull aches in the lower back, leg, or hip.  Numbness in the back of the calf or sole of the foot.  Burning sensations in the lower back, leg, or hip.  Sharp pains in the lower back, leg, or hip.  Leg weakness.  Severe back pain inhibiting movement. These symptoms may get worse with coughing, sneezing, laughing, or prolonged sitting or standing. Also, being overweight may worsen symptoms. DIAGNOSIS  Your caregiver will perform a physical exam to look for common symptoms of sciatica. He or she may ask you to do certain movements or activities that would trigger sciatic nerve pain. Other tests may be performed to find the cause of the sciatica. These may include:  Blood tests.  X-rays.  Imaging tests, such as an MRI or CT scan. TREATMENT  Treatment is directed at the cause of the sciatic pain. Sometimes, treatment is not  necessary and the pain and discomfort goes away on its own. If treatment is needed, your caregiver may suggest:  Over-the-counter medicines to relieve pain.  Prescription medicines, such as anti-inflammatory medicine, muscle relaxants, or narcotics.  Applying heat or ice to the painful area.  Steroid injections to lessen pain, irritation, and inflammation around the nerve.  Reducing activity during periods of pain.  Exercising and stretching to strengthen your abdomen and improve flexibility of your spine. Your caregiver may suggest losing weight if the extra weight makes the back pain worse.  Physical therapy.  Surgery to eliminate what is pressing or pinching the nerve, such as a bone spur or part of a herniated disk. HOME CARE INSTRUCTIONS   Only take over-the-counter or prescription medicines for pain or discomfort as directed by your caregiver.  Apply ice to the affected area for 20 minutes, 3-4 times a day for the first 48-72 hours. Then try heat in the same way.  Exercise, stretch, or perform your usual activities if these do not aggravate your pain.  Attend physical therapy sessions as directed by your caregiver.  Keep all follow-up appointments as directed by your caregiver.  Do not wear high heels or shoes that do not provide proper support.  Check your mattress to see if it is too soft. A firm mattress may lessen your pain and discomfort. SEEK IMMEDIATE MEDICAL CARE IF:   You lose control of your bowel or bladder (incontinence).  You have increasing weakness in the lower  back, pelvis, buttocks, or legs.  You have redness or swelling of your back.  You have a burning sensation when you urinate.  You have pain that gets worse when you lie down or awakens you at night.  Your pain is worse than you have experienced in the past.  Your pain is lasting longer than 4 weeks.  You are suddenly losing weight without reason. MAKE SURE YOU:  Understand these  instructions.  Will watch your condition.  Will get help right away if you are not doing well or get worse.   This information is not intended to replace advice given to you by your health care provider. Make sure you discuss any questions you have with your health care provider.   Document Released: 06/20/2001 Document Revised: 03/17/2015 Document Reviewed: 11/05/2011 Elsevier Interactive Patient Education 2016 [employer] Radicular Pain Radicular pain in either the arm or leg is usually from a bulging or herniated disk in the spine. A piece of the herniated disk may press against the nerves as the nerves exit the spine. This causes pain which is felt at the tips of the nerves down the arm or leg. Other causes of radicular pain may include:  Fractures.  Heart disease.  Cancer.  An abnormal and usually degenerative state of the nervous system or nerves (neuropathy). Diagnosis may require CT or MRI scanning to determine the primary cause.  Nerves that start at the neck (nerve roots) may cause radicular pain in the outer shoulder and arm. It can spread down to the thumb and fingers. The symptoms vary depending on which nerve root has been affected. In most cases radicular pain improves with conservative treatment. Neck problems may require physical therapy, a neck collar, or cervical traction. Treatment may take many weeks, and surgery may be considered if the symptoms do not improve.  Conservative treatment is also recommended for sciatica. Sciatica causes pain to radiate from the lower back or buttock area down the leg into the foot. Often there is a history of back problems. Most patients with sciatica are better after 2 to 4 weeks of rest and other supportive care. Short term bed rest can reduce the disk pressure considerably. Sitting, however, is not a good position since this increases the pressure on the disk. You should avoid bending, lifting, and all other activities which make the  problem worse. Traction can be used in severe cases. Surgery is usually reserved for patients who do not improve within the first months of treatment. Only take over-the-counter or prescription medicines for pain, discomfort, or fever as directed by your caregiver. Narcotics and muscle relaxants may help by relieving more severe pain and spasm and by providing mild sedation. Cold or massage can give significant relief. Spinal manipulation is not recommended. It can increase the degree of disc protrusion. Epidural steroid injections are often effective treatment for radicular pain. These injections deliver medicine to the spinal nerve in the space between the protective covering of the spinal cord and back bones (vertebrae). Your caregiver can give you more information about steroid injections. These injections are most effective when given within two weeks of the onset of pain.  You should see your caregiver for follow up care as recommended. A program for neck and back injury rehabilitation with stretching and strengthening exercises is an important part of management.  SEEK IMMEDIATE MEDICAL CARE IF:  You develop increased pain, weakness, or numbness in your arm or leg.  You develop difficulty with bladder or bowel  control.  You develop abdominal pain.   This information is not intended to replace advice given to you by your health care provider. Make sure you discuss any questions you have with your health care provider.   Document Released: 08/03/2004 Document Revised: 07/17/2014 Document Reviewed: 01/20/2015 Elsevier Interactive Patient Education [employer].

## 2015-10-27 ENCOUNTER — Ambulatory Visit (HOSPITAL_COMMUNITY): Payer: Medicare Other | Attending: Orthopedic Surgery | Admitting: Physical Therapy

## 2015-10-27 DIAGNOSIS — M6281 Muscle weakness (generalized): Secondary | ICD-10-CM

## 2015-10-27 DIAGNOSIS — M25652 Stiffness of left hip, not elsewhere classified: Secondary | ICD-10-CM | POA: Diagnosis not present

## 2015-10-27 DIAGNOSIS — M5442 Lumbago with sciatica, left side: Secondary | ICD-10-CM | POA: Insufficient documentation

## 2015-10-27 DIAGNOSIS — R293 Abnormal posture: Secondary | ICD-10-CM | POA: Diagnosis not present

## 2015-10-27 DIAGNOSIS — R262 Difficulty in walking, not elsewhere classified: Secondary | ICD-10-CM | POA: Diagnosis not present

## 2015-10-27 NOTE — Patient Instructions (Signed)
   BRIDGING  While lying on your back, tighten your lower abdominals, squeeze your buttocks and then raise your buttocks off the floor/bed as creating a "Bridge" with your body.  Repeat 10 times, twice a day.    HIP ABDUCTION - SIDELYING  While lying on your side, slowly raise up your top leg to the side. Keep your knee straight and maintain your toes pointed forward the entire time. Keep your leg in-line with your body.  The bottom leg can be bent to stabilize your body.  Repeat 10 times each side, twice a day.    Isometric Transverse abdominal contraction  Lay on your back with your knees bent.    Place your thumbs on your stomach just inside your hip bones to feel the muscle contract.  Activate your abdominals by pulling everything in.  "Try to bring your naval to your spine."  Hold this contraction for 3-5 seconds, 20 times, 3 times a day.   Learn to use this muscle with daily activities such as lifting, bending, rolling.

## 2015-10-27 NOTE — Therapy (Signed)
[facility] [address] [place], [place], [address] Phone: [phone]   Fax:  [phone]  Physical Therapy Evaluation  Patient Details  Name: Amanda Norris MRN: [id number] Date of Birth: 1946-03-25 Referring Provider: Arther Abbott   Encounter Date: 10/27/2015      PT End of Session - 10/27/15 1209    Visit Number 1   Number of Visits 16   Date for PT Re-Evaluation 11/24/15   Authorization Type Medicare    Authorization Time Period 10/27/15 to 12/27/15   Authorization - Visit Number 1   Authorization - Number of Visits 10   PT Start Time 5284   PT Stop Time 1158   PT Time Calculation (min) 41 min   Activity Tolerance Patient tolerated treatment well   Behavior During Therapy [facility] for tasks assessed/performed      Past Medical History  Diagnosis Date  . PONV (postoperative nausea and vomiting)   . Colon cancer ([place])   . Arthritis     wrist and thumbs  . Sciatica of left side     Past Surgical History  Procedure Laterality Date  . Herniated disc      [id number]  . Colonoscopy N/A 12/12/2013    Procedure: COLONOSCOPY;  Surgeon: Rogene Houston, MD;  Location: [facility];  Service: Endoscopy;  Laterality: N/A;  730  . Eus N/A 12/18/2013    Procedure: UPPER ENDOSCOPIC ULTRASOUND (EUS) LINEAR;  Surgeon: Milus Banister, MD;  Location: [facility];  Service: Endoscopy;  Laterality: N/A;  . Partial colectomy N/A 12/29/2013    Procedure: RIGHT HEMICOLECTOMY;  Surgeon: Jamesetta So, MD;  Location: [facility];  Service: General;  Laterality: N/A;  . Portacath placement Right 02/04/2014    Procedure: INSERTION PORT-A-CATH;  Surgeon: Jamesetta So, MD;  Location: [facility];  Service: General;  Laterality: Right;  . Port-a-cath removal Right 04/01/2014    Procedure: MINOR REMOVAL PORT-A-CATH;  Surgeon: Jamesetta So, MD;  Location: [facility];  Service: General;  Laterality: Right;  . Colonoscopy N/A 01/20/2015    Procedure: COLONOSCOPY;   Surgeon: Rogene Houston, MD;  Location: [facility];  Service: Endoscopy;  Laterality: N/A;  1030    There were no vitals filed for this visit.       Subjective Assessment - 10/27/15 1122    Subjective Cannot remember when pain started exactly.Patient reports that her back does not bother her unless she does heavy lifting; she reports that her pain is mostly in her hip, very intense burning pain that can sometimes happen when she is turning with a bag of groceries. She can tell that it is all coming on when her great toe on her L foot starts tingling and will progress from there. Sometimes her hip and foot hurt so bad that she is in tears, however sitting down completely resolves this pain. No numbness/shooting pains down the back of her leg. [facility], landed on L hip but did not think of anything of it as it did not give her any trouble.    Pertinent History hx of colon cancer, hx of C5-7 herniated disc and surgeries    How long can you sit comfortably? no limits    How long can you stand comfortably? 15 minutes, usually stops between 5-10 minutes    How long can you walk comfortably? 10-15 minutes   Patient Stated Goals be pain free, be able  to be more active    Currently in Pain? No/denies            [facility] PT Assessment - 10/27/15 0001    Assessment   Medical Diagnosis L low back pain with L sciatica    Referring Provider Arther Abbott    Onset Date/Surgical Date --  chronic    Next MD Visit Aline Brochure in June    Precautions   Precaution Comments none    Balance Screen   Has the patient fallen in the past 6 months Yes   How many times? 1- she was sitting on rolling desk chair, pushed chair back and wheel caught and chair fell    Has the patient had a decrease in activity level because of a fear of falling?  No   Is the patient reluctant to leave their home because of a fear of falling?  No   Prior Function   Level of  Independence Independent;Independent with basic ADLs;Independent with gait;Independent with transfers   [employer] work;Works at home   Leisure active hobbies    Observation/Other Assessments   Observations L anterior rotation noted in supine; scourt test negative    Focus on Therapeutic Outcomes (FOTO)  51% limited    AROM   Overall AROM Comments no change with repeated flexion and extension x20 each   L hip stiff in ER, noted tightness of hip ADD musculature   Lumbar Flexion 69   Lumbar Extension 21   Lumbar - Right Side Bend fingers to midline of knee joint    Lumbar - Left Side Bend fingers just above midline of knee joint    Strength   Right Hip Flexion 4+/5   Right Hip Extension 4/5   Right Hip ABduction 4+/5   Left Hip Flexion 4+/5   Left Hip Extension 3/5   Left Hip ABduction 4-/5   Right Knee Flexion 5/5   Right Knee Extension 5/5   Left Knee Flexion 4+/5   Left Knee Extension 4/5   Right Ankle Dorsiflexion 5/5   Left Ankle Dorsiflexion 4+/5   Palpation   Palpation comment tight L piriformis and L hip ADD groups    6 minute walk test results    Aerobic Endurance Distance Walked 565   Endurance additional comments [id number]                            PT Education - 10/27/15 1209    Education provided Yes   Education Details plan of care, HEP, prognosis    Person(s) Educated Patient   Methods Explanation;Demonstration;Verbal cues;Handout   Comprehension Verbalized understanding;Returned demonstration;Need further instruction          PT Short Term Goals - 10/27/15 1215    PT SHORT TERM GOAL #1   Title Patient to demosntrate full ROM of bilateral hips and lumbar spine in order to reduce pain and symptoms, improve tolerance to activitiy    Time 4   Period Weeks   Status New   PT SHORT TERM GOAL #2   Title Patient to report she has been able to ambulate and statically stand for at least 30 minutes with no exacerbation of her pain and  symptoms in order to demonstrate improved functional task performance skills    Time 4   Period Weeks   Status New   PT SHORT TERM GOAL #3   Title Patient to demonstrate proper lifting strategies with load up to  20# in order to prevent exacerbation of symptoms and to reduce chances of recurrence of injury    Time 4   Period Weeks   Status New   PT SHORT TERM GOAL #4   Title Patient to be independent in correctly and consistently performing appropriate HEP, to be updated PRN    Time 4   Period Weeks   Status New           PT Long Term Goals - 10/27/15 1217    PT LONG TERM GOAL #1   Title Patient to demonstrate no LLD within the past 4 weeks and will be independent in self-correction strategies in order to enhance independence in reducing pain adn managing symtpoms    Time 8   Period Weeks   Status New   PT LONG TERM GOAL #2   Title Patient to demonstrate strength 5/5 in all tested muscle groups in order to reduce pain and improve regional stability    Time 8   Period Weeks   Status New   PT LONG TERM GOAL #3   Title Patient to be able to ambulate and statically stand for at least 60 minutes with no exacerbation of pain or symptoms in order to enhance task performance in community and at home    Time 8   Period Weeks   Status New   PT LONG TERM GOAL #4   Title Patient to report she has been able to perform at leaset 20 minutse of light intensity physical activity with no exacerbation of pain or symptoms in order to assist in improving overall health and QOL    Time 8   Period Weeks   Status New               Plan - 10/27/15 1210    Clinical Impression Statement Patient arrives today with diagnosis of L low back pain with L sciatica, however states that her main complaints are the symptoms running down into her foot as well as L hip pain especially when lifting items. She reports her activity has been very limited by her symptoms, however they tend to resolive within  10 minutes when she sits down. Upon examination, patient reveals mild low back stiffness, LLD corrected with MET, muscle weakness, impaired posture and gait, and signficant muscle tightness surrounding L hip (espeically piriformis and hip ADD groups) in particular. Repeated motions did not change pain or symtpoms today.  At this point recommend skilled PT services in order to address functional limtiations and assist in reaching optimal level of function.    Rehab Potential Good   PT Frequency 2x / week   PT Duration 8 weeks   PT Treatment/Interventions ADLs/Self Care Home Management;Biofeedback;Cryotherapy;Moist Heat;Gait training;Stair training;Functional mobility training;Therapeutic activities;Therapeutic exercise;Balance training;Neuromuscular re-education;Patient/family education;Manual techniques;Passive range of motion;Taping   PT Next Visit Plan review HEP and goals; postural training, correct LLD, flexibility, functional strength   PT Home Exercise Plan given    Consulted and Agree with Plan of Care Patient      Patient will benefit from skilled therapeutic intervention in order to improve the following deficits and impairments:  Abnormal gait, Hypomobility, Decreased strength, Pain, Difficulty walking, Decreased activity tolerance, Increased muscle spasms, Improper body mechanics, Decreased coordination, Impaired flexibility, Postural dysfunction  Visit Diagnosis: Left-sided low back pain with left-sided sciatica - Plan: PT plan of care cert/re-cert  Stiffness of left hip, not elsewhere classified - Plan: PT plan of care cert/re-cert  Difficulty in walking, not elsewhere classified -  Plan: PT plan of care cert/re-cert  Muscle weakness (generalized) - Plan: PT plan of care cert/re-cert  Abnormal posture - Plan: PT plan of care cert/re-cert      G-Codes - 33/82/50 1219-09-04    Functional Assessment Tool Used FOTO 51% limited    Functional Limitation Mobility: Walking and moving around    Mobility: Walking and Moving Around Current Status [phone]) At least 40 percent but less than 60 percent impaired, limited or restricted   Mobility: Walking and Moving Around Goal Status [phone]) At least 20 percent but less than 40 percent impaired, limited or restricted       Problem List Patient Active Problem List   Diagnosis Date Noted  . Iron deficiency 02/18/2014  . Adenocarcinoma of transverse colon ([place]) 12/29/2013  . Mass of pancreas 12/08/2013    Deniece Ree PT, DPT [facility] [address] [place], [place], [address] Phone: [phone]   Fax:  [phone]  Name: Amanda Norris MRN: [id number] Date of Birth: 1946-05-31

## 2015-11-04 ENCOUNTER — Ambulatory Visit (HOSPITAL_COMMUNITY): Payer: Medicare Other

## 2015-11-04 DIAGNOSIS — R293 Abnormal posture: Secondary | ICD-10-CM | POA: Diagnosis not present

## 2015-11-04 DIAGNOSIS — M5442 Lumbago with sciatica, left side: Secondary | ICD-10-CM

## 2015-11-04 DIAGNOSIS — R262 Difficulty in walking, not elsewhere classified: Secondary | ICD-10-CM | POA: Diagnosis not present

## 2015-11-04 DIAGNOSIS — M25652 Stiffness of left hip, not elsewhere classified: Secondary | ICD-10-CM

## 2015-11-04 DIAGNOSIS — M6281 Muscle weakness (generalized): Secondary | ICD-10-CM | POA: Diagnosis not present

## 2015-11-04 NOTE — Therapy (Signed)
[facility] [address] [place], [place], [address] Phone: [phone]   Fax:  [phone]  Physical Therapy Treatment  Patient Details  Name: Amanda Norris MRN: [id number] Date of Birth: [birth year] Referring Provider: Arther Abbott   Encounter Date: 11/04/2015      PT End of Session - 11/04/15 1126    Visit Number 2   Number of Visits 16   Date for PT Re-Evaluation 11/24/15   Authorization Type Medicare    Authorization Time Period 10/27/15 to 12/27/15   Authorization - Visit Number 2   Authorization - Number of Visits 10   PT Start Time 1116   PT Stop Time 1205   PT Time Calculation (min) 49 min   Activity Tolerance Patient tolerated treatment well   Behavior During Therapy [facility] for tasks assessed/performed      Past Medical History  Diagnosis Date  . PONV (postoperative nausea and vomiting)   . Colon cancer ([place])   . Arthritis     wrist and thumbs  . Sciatica of left side     Past Surgical History  Procedure Laterality Date  . Herniated disc      1996-c5-c7  . Colonoscopy N/A 12/12/2013    Procedure: COLONOSCOPY;  Surgeon: Rogene Houston, MD;  [facility];  Service: Endoscopy;  Laterality: N/A;  730  . Eus N/A 12/18/2013    Procedure: UPPER ENDOSCOPIC ULTRASOUND (EUS) LINEAR;  Surgeon: Milus Banister, MD;  [facility];  Service: Endoscopy;  Laterality: N/A;  . Partial colectomy N/A 12/29/2013    Procedure: RIGHT HEMICOLECTOMY;  Surgeon: Jamesetta So, MD;  Location: [facility] ORS;  Service: General;  Laterality: N/A;  . Portacath placement Right 02/04/2014    Procedure: INSERTION PORT-A-CATH;  Surgeon: Jamesetta So, MD;  Location: [facility] ORS;  Service: General;  Laterality: Right;  . Port-a-cath removal Right 04/01/2014    Procedure: MINOR REMOVAL PORT-A-CATH;  Surgeon: Jamesetta So, MD;  Location: [facility] OR[facility];  Service: General;  Laterality: Right;  . Colonoscopy N/A 01/20/2015    Procedure: COLONOSCOPY;   Surgeon: Rogene Houston, MD;  [facility];  Service: Endoscopy;  Laterality: N/A;  1030    There were no vitals filed for this visit.      Subjective Assessment - 11/04/15 1101    Subjective Pt reports compliance with HEP without questions, does report increased pain cervical 5, 6 and 7 with bridges (fusion 20 years ago) though pain resolved within minutes completeing exercise.     Pertinent History hx of colon cancer, hx of C5-7 herniated disc and surgeries    Patient Stated Goals be pain free, be able to be more active    Currently in Pain? No/denies           [facility] Adult PT Treatment/Exercise - 11/04/15 0001    Bed Mobility   Bed Mobility --  instructed proper bed mobilty sit to supine (log roll)   Exercises   Exercises Lumbar   Lumbar Exercises: Stretches   Active Hamstring Stretch 2 reps;30 seconds   Active Hamstring Stretch Limitations supine with rope   Lower Trunk Rotation 5 reps;10 seconds   Pelvic Tilt 5 reps   Pelvic Tilt Limitations seated checking SI alignment   Piriformis Stretch 2 reps;30 seconds   Piriformis Stretch Limitations seated    Lumbar Exercises: Seated   Other Seated Lumbar Exercises butterfly adductor stretch 3x 20"   Lumbar Exercises: Supine  Ab Set 10 reps;5 seconds   AB Set Limitations Cueing for TrA activation and to continue breathing   Bent Knee Raise 10 reps;5 seconds   Bent Knee Raise Limitations cueing for stability   Bridge 10 reps   Other Supine Lumbar Exercises Supine adductor stretch on 12 in step   Manual Therapy   Manual Therapy Soft tissue mobilization   Manual therapy comments manual complete separate rest of treatment   Soft tissue mobilization Lt adductor with LE elevated                  PT Short Term Goals - 10/27/15 1215    PT SHORT TERM GOAL #1   Title Patient to demosntrate full ROM of bilateral hips and lumbar spine in order to reduce pain and symptoms, improve tolerance to activitiy    Time  4   Period Weeks   Status New   PT SHORT TERM GOAL #2   Title Patient to report she has been able to ambulate and statically stand for at least 30 minutes with no exacerbation of her pain and symptoms in order to demonstrate improved functional task performance skills    Time 4   Period Weeks   Status New   PT SHORT TERM GOAL #3   Title Patient to demonstrate proper lifting strategies with load up to 20# in order to prevent exacerbation of symptoms and to reduce chances of recurrence of injury    Time 4   Period Weeks   Status New   PT SHORT TERM GOAL #4   Title Patient to be independent in correctly and consistently performing appropriate HEP, to be updated PRN    Time 4   Period Weeks   Status New           PT Long Term Goals - 10/27/15 1217    PT LONG TERM GOAL #1   Title Patient to demonstrate no LLD within the past 4 weeks and will be independent in self-correction strategies in order to enhance independence in reducing pain adn managing symtpoms    Time 8   Period Weeks   Status New   PT LONG TERM GOAL #2   Title Patient to demonstrate strength 5/5 in all tested muscle groups in order to reduce pain and improve regional stability    Time 8   Period Weeks   Status New   PT LONG TERM GOAL #3   Title Patient to be able to ambulate and statically stand for at least 60 minutes with no exacerbation of pain or symptoms in order to enhance task performance in community and at home    Time 8   Period Weeks   Status New   PT LONG TERM GOAL #4   Title Patient to report she has been able to perform at leaset 20 minutse of light intensity physical activity with no exacerbation of pain or symptoms in order to assist in improving overall health and QOL    Time 8   Period Weeks   Status New               Plan - 11/04/15 1126    Clinical Impression Statement Reviewed goals, compliance and assured correct technqiue with HEP and copy of eval given to pt.  Session focus on  improving core and proximal musculature strengtheing and functional LE stretches to improve mobility.  Pt educated on importance of proper posture to reduce stress of lumbar musculature.  Pt instructed proper  technique for TrA activation with verbal and tactile cueing to improve contraction and continue breathing during exercise.  Pt c/o "popping" during bent knee raise with Rt LE.  Checked SI alignment with good alignment noted, no muscle energy technique required this session.  Pt does demonstrate decreased lumbar and hip mobility Lt >Rt.  Added stretches to pirifomris, h/s and adductor to improve flexibilty.  Manual soft tissue mobilization complete to reduce Lt adductor musculature to reduce tightness and assist with mobilty.  No reports of pain at end of session.     Rehab Potential Good   PT Frequency 2x / week   PT Duration 8 weeks   PT Treatment/Interventions ADLs/Self Care Home Management;Biofeedback;Cryotherapy;Moist Heat;Gait training;Stair training;Functional mobility training;Therapeutic activities;Therapeutic exercise;Balance training;Neuromuscular re-education;Patient/family education;Manual techniques;Passive range of motion;Taping   PT Next Visit Plan  postural training, correct LLD, flexibility, functional strength      Patient will benefit from skilled therapeutic intervention in order to improve the following deficits and impairments:  Abnormal gait, Hypomobility, Decreased strength, Pain, Difficulty walking, Decreased activity tolerance, Increased muscle spasms, Improper body mechanics, Decreased coordination, Impaired flexibility, Postural dysfunction  Visit Diagnosis: Left-sided low back pain with left-sided sciatica  Stiffness of left hip, not elsewhere classified  Difficulty in walking, not elsewhere classified  Muscle weakness (generalized)  Abnormal posture     Problem List Patient Active Problem List   Diagnosis Date Noted  . Iron deficiency 02/18/2014  .  Adenocarcinoma of transverse colon ([place]) 12/29/2013  . Mass of pancreas 12/08/2013   Ihor Austin, [place]; [place]   Aldona Lento 11/04/2015, 12:08 PM  [place] [address] [place], [place], [address] Phone: [phone]   Fax:  [phone]  Name: Amanda Norris MRN: [id number] Date of Birth: 1945/11/30

## 2015-11-09 ENCOUNTER — Ambulatory Visit (HOSPITAL_COMMUNITY): Payer: Medicare Other | Attending: Orthopedic Surgery

## 2015-11-09 DIAGNOSIS — M25652 Stiffness of left hip, not elsewhere classified: Secondary | ICD-10-CM | POA: Diagnosis not present

## 2015-11-09 DIAGNOSIS — M5442 Lumbago with sciatica, left side: Secondary | ICD-10-CM | POA: Diagnosis not present

## 2015-11-09 DIAGNOSIS — R262 Difficulty in walking, not elsewhere classified: Secondary | ICD-10-CM | POA: Insufficient documentation

## 2015-11-09 DIAGNOSIS — R293 Abnormal posture: Secondary | ICD-10-CM | POA: Insufficient documentation

## 2015-11-09 DIAGNOSIS — M6281 Muscle weakness (generalized): Secondary | ICD-10-CM | POA: Diagnosis not present

## 2015-11-09 NOTE — Therapy (Signed)
[facility], [place], [address] Phone: [phone]   Fax:  [phone]  Physical Therapy Treatment  Patient Details  Name: Amanda Norris MRN: [id number] Date of Birth: [birth year] Referring Provider: Arther Abbott   Encounter Date: 11/09/2015      PT End of Session - 11/09/15 1442    Visit Number 3   Number of Visits 16   Date for PT Re-Evaluation 11/24/15   Authorization Type Medicare    Authorization Time Period 10/27/15 to 12/27/15   Authorization - Visit Number 3   Authorization - Number of Visits 10   PT Start Time 5009   PT Stop Time 1514   PT Time Calculation (min) 39 min   Activity Tolerance Patient tolerated treatment well   Behavior During Therapy [facility] for tasks assessed/performed      Past Medical History  Diagnosis Date  . PONV (postoperative nausea and vomiting)   . Colon cancer ([place])   . Arthritis     wrist and thumbs  . Sciatica of left side     Past Surgical History  Procedure Laterality Date  . Herniated disc      1996-c5-c7  . Colonoscopy N/A 12/12/2013    Procedure: COLONOSCOPY;  Surgeon: Rogene Houston, MD;  [facility];  Service: Endoscopy;  Laterality: N/A;  730  . Eus N/A 12/18/2013    Procedure: UPPER ENDOSCOPIC ULTRASOUND (EUS) LINEAR;  Surgeon: Milus Banister, MD;  [facility];  Service: Endoscopy;  Laterality: N/A;  . Partial colectomy N/A 12/29/2013    Procedure: RIGHT HEMICOLECTOMY;  Surgeon: Jamesetta So, MD;  Location: [facility];  Service: General;  Laterality: N/A;  . Portacath placement Right 02/04/2014    Procedure: INSERTION PORT-A-CATH;  Surgeon: Jamesetta So, MD;  Location: [facility];  Service: General;  Laterality: Right;  . Port-a-cath removal Right 04/01/2014    Procedure: MINOR REMOVAL PORT-A-CATH;  Surgeon: Jamesetta So, MD;  Location: [facility];  Service: General;  Laterality: Right;  . Colonoscopy N/A 01/20/2015    Procedure: COLONOSCOPY;   Surgeon: Rogene Houston, MD;  [facility];  Service: Endoscopy;  Laterality: N/A;  1030    There were no vitals filed for this visit.      Subjective Assessment - 11/09/15 1437    Subjective Pt stated she has experience lightheaded and some dizziness today, not currently.  Currently pain free though has experienced Lt sided lowe back pain while sitting on couch.  No reports of pain currently.   Pertinent History hx of colon cancer, hx of C5-7 herniated disc and surgeries    Patient Stated Goals be pain free, be able to be more active    Currently in Pain? No/denies            [facility] Adult PT Treatment/Exercise - 11/09/15 0001    Lumbar Exercises: Stretches   Active Hamstring Stretch 2 reps;30 seconds   Active Hamstring Stretch Limitations supine with rope   Pelvic Tilt 5 reps   Pelvic Tilt Limitations seated checking SI alignment   Piriformis Stretch 2 reps;30 seconds   Piriformis Stretch Limitations seated    Lumbar Exercises: Seated   Other Seated Lumbar Exercises butterfly adductor stretch 3x 20"; cervical and scapular retraction   Lumbar Exercises: Supine   Ab Set 10 reps;5 seconds   AB Set Limitations Cueing to breath, improved TrA activation   Bent Knee Raise 10 reps;5 seconds  Bent Knee Raise Limitations dead bugu   Bridge 10 reps   Manual Therapy   Manual Therapy Muscle Energy Technique;Soft tissue mobilization   Manual therapy comments manual complete separate rest of treatment   Soft tissue mobilization Lt adductor with LE elevated   Muscle Energy Technique MET for Lt anterior rotation f/b core strengthening                  PT Short Term Goals - 10/27/15 1215    PT SHORT TERM GOAL #1   Title Patient to demosntrate full ROM of bilateral hips and lumbar spine in order to reduce pain and symptoms, improve tolerance to activitiy    Time 4   Period Weeks   Status New   PT SHORT TERM GOAL #2   Title Patient to report she has been able to  ambulate and statically stand for at least 30 minutes with no exacerbation of her pain and symptoms in order to demonstrate improved functional task performance skills    Time 4   Period Weeks   Status New   PT SHORT TERM GOAL #3   Title Patient to demonstrate proper lifting strategies with load up to 20# in order to prevent exacerbation of symptoms and to reduce chances of recurrence of injury    Time 4   Period Weeks   Status New   PT SHORT TERM GOAL #4   Title Patient to be independent in correctly and consistently performing appropriate HEP, to be updated PRN    Time 4   Period Weeks   Status New           PT Long Term Goals - 10/27/15 1217    PT LONG TERM GOAL #1   Title Patient to demonstrate no LLD within the past 4 weeks and will be independent in self-correction strategies in order to enhance independence in reducing pain adn managing symtpoms    Time 8   Period Weeks   Status New   PT LONG TERM GOAL #2   Title Patient to demonstrate strength 5/5 in all tested muscle groups in order to reduce pain and improve regional stability    Time 8   Period Weeks   Status New   PT LONG TERM GOAL #3   Title Patient to be able to ambulate and statically stand for at least 60 minutes with no exacerbation of pain or symptoms in order to enhance task performance in community and at home    Time 8   Period Weeks   Status New   PT LONG TERM GOAL #4   Title Patient to report she has been able to perform at leaset 20 minutse of light intensity physical activity with no exacerbation of pain or symptoms in order to assist in improving overall health and QOL    Time 8   Period Weeks   Status New               Plan - 11/09/15 1513    Clinical Impression Statement Noted initial LLD, check SI with the following findings:  Lt SI anterior rotation, muscle energy technique complete to improve SI alignment.  Core stability exercises complete to improve SI alignment and increased focus  on stretches especially with Lt adductor musculature.  Manual soft tissue mobilization complete to Lt adducttor to reduce tighness and pain.  Therapist facilitation to improve stability through session.  No reports of pain at end of session.  Continued education with importance of  proper posture, added cervical and scapular strengthening exercises for posture with min verbal and visual cueing required for form.  Pt did report she has increased awarenesswith of posture throughout her day.     Rehab Potential Good   PT Frequency 2x / week   PT Duration 8 weeks   PT Treatment/Interventions ADLs/Self Care Home Management;Biofeedback;Cryotherapy;Moist Heat;Gait training;Stair training;Functional mobility training;Therapeutic activities;Therapeutic exercise;Balance training;Neuromuscular re-education;Patient/family education;Manual techniques;Passive range of motion;Taping   PT Next Visit Plan  postural training, correct LLD, flexibility, functional strength      Patient will benefit from skilled therapeutic intervention in order to improve the following deficits and impairments:  Abnormal gait, Hypomobility, Decreased strength, Pain, Difficulty walking, Decreased activity tolerance, Increased muscle spasms, Improper body mechanics, Decreased coordination, Impaired flexibility, Postural dysfunction  Visit Diagnosis: Left-sided low back pain with left-sided sciatica  Stiffness of left hip, not elsewhere classified  Difficulty in walking, not elsewhere classified  Muscle weakness (generalized)  Abnormal posture     Problem List Patient Active Problem List   Diagnosis Date Noted  . Iron deficiency 02/18/2014  . Adenocarcinoma of transverse colon ([place]) 12/29/2013  . Mass of pancreas 12/08/2013   Ihor Austin, [place]; [place]  Aldona Lento 11/09/2015, 3:25 PM  [facility] [address] [place], [place], [address] Phone:  [phone]   Fax:  [phone]  Name: Amanda Norris MRN: [id number] Date of Birth: [birth year]

## 2015-11-10 ENCOUNTER — Ambulatory Visit (HOSPITAL_COMMUNITY): Payer: Medicare Other

## 2015-11-10 DIAGNOSIS — M5442 Lumbago with sciatica, left side: Secondary | ICD-10-CM | POA: Diagnosis not present

## 2015-11-10 DIAGNOSIS — M6281 Muscle weakness (generalized): Secondary | ICD-10-CM

## 2015-11-10 DIAGNOSIS — R293 Abnormal posture: Secondary | ICD-10-CM | POA: Diagnosis not present

## 2015-11-10 DIAGNOSIS — M25652 Stiffness of left hip, not elsewhere classified: Secondary | ICD-10-CM

## 2015-11-10 DIAGNOSIS — R262 Difficulty in walking, not elsewhere classified: Secondary | ICD-10-CM

## 2015-11-10 NOTE — Therapy (Signed)
[name], [place], [address] Phone: [phone]   Fax:  [phone]  Physical Therapy Treatment  Patient Details  Name: Amanda Norris MRN: [id number] Date of Birth: 24-Jul-1945 Referring Provider: Arther Abbott   Encounter Date: 11/10/2015      PT End of Session - 11/10/15 1109    Visit Number 4   Number of Visits 16   Date for PT Re-Evaluation 11/24/15   Authorization Type Medicare    Authorization Time Period 10/27/15 to 12/27/15   Authorization - Visit Number 4   Authorization - Number of Visits 10   PT Start Time [id number]   PT Stop Time 1108   PT Time Calculation (min) 41 min   Activity Tolerance Patient tolerated treatment well;Patient limited by fatigue;No increased pain   Behavior During Therapy [facility] for tasks assessed/performed      Past Medical History  Diagnosis Date  . PONV (postoperative nausea and vomiting)   . Colon cancer ([place])   . Arthritis     wrist and thumbs  . Sciatica of left side     Past Surgical History  Procedure Laterality Date  . Herniated disc      1996-c5-c7  . Colonoscopy N/A 12/12/2013    Procedure: COLONOSCOPY;  Surgeon: Rogene Houston, MD;  [facility];  Service: Endoscopy;  Laterality: N/A;  730  . Eus N/A 12/18/2013    Procedure: UPPER ENDOSCOPIC ULTRASOUND (EUS) LINEAR;  Surgeon: Milus Banister, MD;  [facility];  Service: Endoscopy;  Laterality: N/A;  . Partial colectomy N/A 12/29/2013    Procedure: RIGHT HEMICOLECTOMY;  Surgeon: Jamesetta So, MD;  Location: [facility];  Service: General;  Laterality: N/A;  . Portacath placement Right 02/04/2014    Procedure: INSERTION PORT-A-CATH;  Surgeon: Jamesetta So, MD;  Location: [facility];  Service: General;  Laterality: Right;  . Port-a-cath removal Right 04/01/2014    Procedure: MINOR REMOVAL PORT-A-CATH;  Surgeon: Jamesetta So, MD;  Location: [facility];  Service: General;  Laterality: Right;  . Colonoscopy  N/A 01/20/2015    Procedure: COLONOSCOPY;  Surgeon: Rogene Houston, MD;  [facility];  Service: Endoscopy;  Laterality: N/A;  1030    There were no vitals filed for this visit.      Subjective Assessment - 11/10/15 1030    Subjective Pt reports she found herself to be in a great deal of pain yesterday after session. She took some paid meds to help. She feels better this morning. pain free at arrival.    Pertinent History hx of colon cancer, hx of C5-7 herniated disc and surgeries    How long can you sit comfortably? no limits    How long can you stand comfortably? 15 minutes, usually stops between 5-10 minutes    How long can you walk comfortably? 10-15 minutes   Patient Stated Goals be pain free, be able to be more active    Currently in Pain? No/denies                         [facility] Adult PT Treatment/Exercise - 11/10/15 0001    Lumbar Exercises: Stretches   Active Hamstring Stretch 5 reps;20 seconds  5x15s bilat, LAQ hip at 90 degrees   Active Hamstring Stretch Limitations R side tighter than left   Lumbar Exercises: Supine   Ab Set 10 reps;5 seconds   AB Set Limitations excellent  activation, strong contraction.    Bent Knee Raise 10 reps;5 seconds  c feedback cuff at 87mmhg   Bent Knee Raise Limitations dead bugu   Bridge 10 reps   Other Supine Lumbar Exercises RLE Signle leg bridge  1x10 to fix innominate rotation.    Other Supine Lumbar Exercises partial heel slides from hooklying  15x bilat c biocuf @70mmHg    Manual Therapy   Manual therapy comments Shotgun techniques for pelvis reset, 4x15sec alterating Abd/Add                PT Education - 11/10/15 1106    Education provided Yes   Education Details explained how core stabilization program is going to fit into funtional dialy activity and reduction of symptoms.    Person(s) Educated Patient   Methods Explanation;Demonstration;Tactile cues   Comprehension Verbalized  understanding;Verbal cues required;Returned demonstration          PT Short Term Goals - 10/27/15 1215    PT SHORT TERM GOAL #1   Title Patient to demosntrate full ROM of bilateral hips and lumbar spine in order to reduce pain and symptoms, improve tolerance to activitiy    Time 4   Period Weeks   Status New   PT SHORT TERM GOAL #2   Title Patient to report she has been able to ambulate and statically stand for at least 30 minutes with no exacerbation of her pain and symptoms in order to demonstrate improved functional task performance skills    Time 4   Period Weeks   Status New   PT SHORT TERM GOAL #3   Title Patient to demonstrate proper lifting strategies with load up to 20# in order to prevent exacerbation of symptoms and to reduce chances of recurrence of injury    Time 4   Period Weeks   Status New   PT SHORT TERM GOAL #4   Title Patient to be independent in correctly and consistently performing appropriate HEP, to be updated PRN    Time 4   Period Weeks   Status New           PT Long Term Goals - 10/27/15 1217    PT LONG TERM GOAL #1   Title Patient to demonstrate no LLD within the past 4 weeks and will be independent in self-correction strategies in order to enhance independence in reducing pain adn managing symtpoms    Time 8   Period Weeks   Status New   PT LONG TERM GOAL #2   Title Patient to demonstrate strength 5/5 in all tested muscle groups in order to reduce pain and improve regional stability    Time 8   Period Weeks   Status New   PT LONG TERM GOAL #3   Title Patient to be able to ambulate and statically stand for at least 60 minutes with no exacerbation of pain or symptoms in order to enhance task performance in community and at home    Time 8   Period Weeks   Status New   PT LONG TERM GOAL #4   Title Patient to report she has been able to perform at leaset 20 minutse of light intensity physical activity with no exacerbation of pain or symptoms in  order to assist in improving overall health and QOL    Time 8   Period Weeks   Status New               Plan - 11/10/15 1110  Clinical Impression Statement Utilized R single leg bridge to address L anterior innomiate, which changed LLD meausrements both from umbilicus and ASIS bilat. LLD is not significnatly different, but pain around the L ASIS is resolved immediately. This is added to HEP. Utilized the [employer] for lumbar feedback during bent knee raises and heel slides, showing some rotational instability with RLE movement. Will further investgate R strength deficits. Makign progress toward goals. Fast learner, and motivated to continue progress.    Rehab Potential Good   PT Frequency 2x / week   PT Duration 8 weeks   PT Treatment/Interventions ADLs/Self Care Home Management;Biofeedback;Cryotherapy;Moist Heat;Gait training;Stair training;Functional mobility training;Therapeutic activities;Therapeutic exercise;Balance training;Neuromuscular re-education;Patient/family education;Manual techniques;Passive range of motion;Taping   PT Next Visit Plan Attempt quadruped core progression and chair squats.    PT Home Exercise Plan updated.    Consulted and Agree with Plan of Care Patient      Patient will benefit from skilled therapeutic intervention in order to improve the following deficits and impairments:  Abnormal gait, Hypomobility, Decreased strength, Pain, Difficulty walking, Decreased activity tolerance, Increased muscle spasms, Improper body mechanics, Decreased coordination, Impaired flexibility, Postural dysfunction  Visit Diagnosis: Left-sided low back pain with left-sided sciatica  Stiffness of left hip, not elsewhere classified  Difficulty in walking, not elsewhere classified  Muscle weakness (generalized)  Abnormal posture     Problem List Patient Active Problem List   Diagnosis Date Noted  . Iron deficiency 02/18/2014  . Adenocarcinoma of  transverse colon ([place]) 12/29/2013  . Mass of pancreas 12/08/2013   11:14 AM, 11/10/2015 Etta Grandchild, PT, DPT PRN Physical Therapist - [place] License # [id number] [phone] (mobile)   [facility] [address] [place], [place], [address] Phone: [phone]   Fax:  [phone]  Name: LAVONYA LANGER MRN: [id number] Date of Birth: Jul 24, 1945

## 2015-11-15 ENCOUNTER — Ambulatory Visit (HOSPITAL_COMMUNITY): Payer: Medicare Other | Admitting: Physical Therapy

## 2015-11-15 DIAGNOSIS — M25652 Stiffness of left hip, not elsewhere classified: Secondary | ICD-10-CM | POA: Diagnosis not present

## 2015-11-15 DIAGNOSIS — M6281 Muscle weakness (generalized): Secondary | ICD-10-CM | POA: Diagnosis not present

## 2015-11-15 DIAGNOSIS — M5442 Lumbago with sciatica, left side: Secondary | ICD-10-CM

## 2015-11-15 DIAGNOSIS — R293 Abnormal posture: Secondary | ICD-10-CM

## 2015-11-15 DIAGNOSIS — R262 Difficulty in walking, not elsewhere classified: Secondary | ICD-10-CM | POA: Diagnosis not present

## 2015-11-15 NOTE — Therapy (Signed)
[facility] [address] [place], [place], [address] Phone: [phone]   Fax:  [phone]  Physical Therapy Treatment  Patient Details  Name: Amanda Norris MRN: [id number] Date of Birth: 1945/09/07 Referring Provider: Arther Abbott   Encounter Date: 11/15/2015      PT End of Session - 11/15/15 1048    Visit Number 5   Number of Visits 16   Date for PT Re-Evaluation 11/24/15   Authorization Type Medicare    Authorization Time Period 10/27/15 to 12/27/15   Authorization - Visit Number 5   Authorization - Number of Visits 10   PT Start Time 6701   PT Stop Time 1114   PT Time Calculation (min) 39 min   Activity Tolerance Patient tolerated treatment well      Past Medical History  Diagnosis Date  . PONV (postoperative nausea and vomiting)   . Colon cancer ([place])   . Arthritis     wrist and thumbs  . Sciatica of left side     Past Surgical History  Procedure Laterality Date  . Herniated disc      1996-c5-c7  . Colonoscopy N/A 12/12/2013    Procedure: COLONOSCOPY;  Surgeon: Rogene Houston, MD;  [facility];  Service: Endoscopy;  Laterality: N/A;  730  . Eus N/A 12/18/2013    Procedure: UPPER ENDOSCOPIC ULTRASOUND (EUS) LINEAR;  Surgeon: Milus Banister, MD;  [facility]: [facility];  Service: Endoscopy;  Laterality: N/A;  . Partial colectomy N/A 12/29/2013    Procedure: RIGHT HEMICOLECTOMY;  Surgeon: Jamesetta So, MD;  Location: [facility];  Service: General;  Laterality: N/A;  . Portacath placement Right 02/04/2014    Procedure: INSERTION PORT-A-CATH;  Surgeon: Jamesetta So, MD;  Location: [facility];  Service: General;  Laterality: Right;  . Port-a-cath removal Right 04/01/2014    Procedure: MINOR REMOVAL PORT-A-CATH;  Surgeon: Jamesetta So, MD;  Location: [facility];  Service: General;  Laterality: Right;  . Colonoscopy N/A 01/20/2015    Procedure: COLONOSCOPY;  Surgeon: Rogene Houston, MD;  [facility];   Service: Endoscopy;  Laterality: N/A;  1030    There were no vitals filed for this visit.      Subjective Assessment - 11/15/15 1039    Subjective Pt states she is tight and sore all over from the exercises.  Pt states she has bilateral hip pain which is a dull ache. Her back is feeling alright.    Pertinent History hx of colon cancer, hx of C5-7 herniated disc and surgeries    Currently in Pain? Yes   Pain Score 3    Pain Location Hip   Pain Orientation Right;Left   Pain Descriptors / Indicators Aching   Pain Type Chronic pain   Pain Onset More than a month ago   Pain Frequency Constant   Aggravating Factors  standing on her feet    Pain Relieving Factors lying down                          [facility] Adult PT Treatment/Exercise - 11/15/15 0001    Lumbar Exercises: Stretches   Active Hamstring Stretch 3 reps;30 seconds   Active Hamstring Stretch Limitations supine    Single Knee to Chest Stretch 3 reps;30 seconds   Prone on Elbows Stretch 3 reps   Piriformis Stretch 3 reps   Piriformis Stretch Limitations knee to opposite arm  Lumbar Exercises: Supine   Bridge 15 reps   Other Supine Lumbar Exercises 3-D hip excursion; decompression exercises 1-5 x 5 reps each    Other Supine Lumbar Exercises isometric hip ab/adduction  10    Manual Therapy   Manual Therapy Soft tissue mobilization;Muscle Energy Technique   Manual therapy comments manual complete separate rest of treatment   Muscle Energy Technique MET for SI dysfunction.                  PT Education - 11/15/15 1047    Education provided Yes   Education Details decompression exercise, 3- D exercises    Person(s) Educated Patient   Methods Explanation;Verbal cues;Handout   Comprehension Verbalized understanding;Returned demonstration          PT Short Term Goals - 10/27/15 1215    PT SHORT TERM GOAL #1   Title Patient to demosntrate full ROM of bilateral hips and lumbar spine in order to  reduce pain and symptoms, improve tolerance to activitiy    Time 4   Period Weeks   Status New   PT SHORT TERM GOAL #2   Title Patient to report she has been able to ambulate and statically stand for at least 30 minutes with no exacerbation of her pain and symptoms in order to demonstrate improved functional task performance skills    Time 4   Period Weeks   Status New   PT SHORT TERM GOAL #3   Title Patient to demonstrate proper lifting strategies with load up to 20# in order to prevent exacerbation of symptoms and to reduce chances of recurrence of injury    Time 4   Period Weeks   Status New   PT SHORT TERM GOAL #4   Title Patient to be independent in correctly and consistently performing appropriate HEP, to be updated PRN    Time 4   Period Weeks   Status New           PT Long Term Goals - 10/27/15 1217    PT LONG TERM GOAL #1   Title Patient to demonstrate no LLD within the past 4 weeks and will be independent in self-correction strategies in order to enhance independence in reducing pain adn managing symtpoms    Time 8   Period Weeks   Status New   PT LONG TERM GOAL #2   Title Patient to demonstrate strength 5/5 in all tested muscle groups in order to reduce pain and improve regional stability    Time 8   Period Weeks   Status New   PT LONG TERM GOAL #3   Title Patient to be able to ambulate and statically stand for at least 60 minutes with no exacerbation of pain or symptoms in order to enhance task performance in community and at home    Time 8   Period Weeks   Status New   PT LONG TERM GOAL #4   Title Patient to report she has been able to perform at leaset 20 minutse of light intensity physical activity with no exacerbation of pain or symptoms in order to assist in improving overall health and QOL    Time 8   Period Weeks   Status New               Plan - 11/15/15 1114    Clinical Impression Statement Pt session concentrated on stretching.  With  decompression 1-5 and hip excursion exercises added to HEP.  Pt SI checked  with noted posterior rotation of the LT inominate.  MM energies and jt mobilization used with good results.     PT Frequency 2x / week   PT Duration 8 weeks   PT Treatment/Interventions ADLs/Self Care Home Management;Biofeedback;Cryotherapy;Moist Heat;Gait training;Stair training;Functional mobility training;Therapeutic activities;Therapeutic exercise;Balance training;Neuromuscular re-education;Patient/family education;Manual techniques;Passive range of motion;Taping   PT Next Visit Plan check SI allignment.  progreass to chair squat and prone core strengthening .    PT Home Exercise Plan updated.       Patient will benefit from skilled therapeutic intervention in order to improve the following deficits and impairments:  Abnormal gait, Hypomobility, Decreased strength, Pain, Difficulty walking, Decreased activity tolerance, Increased muscle spasms, Improper body mechanics, Decreased coordination, Impaired flexibility, Postural dysfunction  Visit Diagnosis: Left-sided low back pain with left-sided sciatica  Stiffness of left hip, not elsewhere classified  Difficulty in walking, not elsewhere classified  Muscle weakness (generalized)  Abnormal posture     Problem List Patient Active Problem List   Diagnosis Date Noted  . Iron deficiency 02/18/2014  . Adenocarcinoma of transverse colon ([place]) 12/29/2013  . Mass of pancreas 12/08/2013    Rayetta Humphrey, PT CLT [phone] 11/15/2015, 11:20 AM  [facility] [address] [place], [place], [address] Phone: [phone]   Fax:  [phone]  Name: Amanda Norris MRN: [id number] Date of Birth: 1945/10/03

## 2015-11-16 DIAGNOSIS — E663 Overweight: Secondary | ICD-10-CM | POA: Diagnosis not present

## 2015-11-16 DIAGNOSIS — Z0000 Encounter for general adult medical examination without abnormal findings: Secondary | ICD-10-CM | POA: Diagnosis not present

## 2015-11-16 DIAGNOSIS — Z1389 Encounter for screening for other disorder: Secondary | ICD-10-CM | POA: Diagnosis not present

## 2015-11-16 DIAGNOSIS — Z6829 Body mass index (BMI) 29.0-29.9, adult: Secondary | ICD-10-CM | POA: Diagnosis not present

## 2015-11-17 ENCOUNTER — Ambulatory Visit (HOSPITAL_COMMUNITY): Payer: Medicare Other | Admitting: Physical Therapy

## 2015-11-17 DIAGNOSIS — R293 Abnormal posture: Secondary | ICD-10-CM | POA: Diagnosis not present

## 2015-11-17 DIAGNOSIS — M25652 Stiffness of left hip, not elsewhere classified: Secondary | ICD-10-CM | POA: Diagnosis not present

## 2015-11-17 DIAGNOSIS — M6281 Muscle weakness (generalized): Secondary | ICD-10-CM

## 2015-11-17 DIAGNOSIS — M5442 Lumbago with sciatica, left side: Secondary | ICD-10-CM

## 2015-11-17 DIAGNOSIS — R262 Difficulty in walking, not elsewhere classified: Secondary | ICD-10-CM

## 2015-11-17 NOTE — Therapy (Signed)
[facility] [place], [place], [address] Phone: [phone]   Fax:  [phone]  Physical Therapy Treatment  Patient Details  Name: Amanda Norris MRN: [id number] Date of Birth: [birth year] Referring Provider: Arther Abbott   Encounter Date: 11/17/2015      PT End of Session - 11/17/15 1111    Visit Number 6   Number of Visits 16   Date for PT Re-Evaluation 11/24/15   Authorization Type Medicare    Authorization Time Period 10/27/15 to 12/27/15   Authorization - Visit Number 6   Authorization - Number of Visits 10   PT Start Time [id number]   PT Stop Time 1111   PT Time Calculation (min) 41 min   Activity Tolerance Patient tolerated treatment well   Behavior During Therapy [facility] for tasks assessed/performed      Past Medical History  Diagnosis Date  . PONV (postoperative nausea and vomiting)   . Colon cancer ([place])   . Arthritis     wrist and thumbs  . Sciatica of left side     Past Surgical History  Procedure Laterality Date  . Herniated disc      1996-c5-c7  . Colonoscopy N/A 12/12/2013    Procedure: COLONOSCOPY;  Surgeon: Rogene Houston, MD;  [facility];  Service: Endoscopy;  Laterality: N/A;  730  . Eus N/A 12/18/2013    Procedure: UPPER ENDOSCOPIC ULTRASOUND (EUS) LINEAR;  Surgeon: Milus Banister, MD;  [facility];  Service: Endoscopy;  Laterality: N/A;  . Partial colectomy N/A 12/29/2013    Procedure: RIGHT HEMICOLECTOMY;  Surgeon: Jamesetta So, MD;  [facility]: [facility];  Service: General;  Laterality: N/A;  . Portacath placement Right 02/04/2014    Procedure: INSERTION PORT-A-CATH;  Surgeon: Jamesetta So, MD;  Location: [facility];  Service: General;  Laterality: Right;  . Port-a-cath removal Right 04/01/2014    Procedure: MINOR REMOVAL PORT-A-CATH;  Surgeon: Jamesetta So, MD;  Location: [facility];  Service: General;  Laterality: Right;  . Colonoscopy N/A 01/20/2015    Procedure: COLONOSCOPY;   Surgeon: Rogene Houston, MD;  [facility];  Service: Endoscopy;  Laterality: N/A;  1030    There were no vitals filed for this visit.      Subjective Assessment - 11/17/15 1032    Subjective Patient reports that she is feeling tight and sore from all the exercises, it is pretty constant and she has been having a harder time standing up after sitting down becasue of it.    Pertinent History hx of colon cancer, hx of C5-7 herniated disc and surgeries    Currently in Pain? Yes   Pain Score 4    Pain Location Shoulder   Pain Orientation Right;Left   Pain Descriptors / Indicators Discomfort;Tightness   Pain Type Acute pain   Pain Radiating Towards none    Pain Onset More than a month ago   Pain Frequency Constant   Aggravating Factors  moving around    Pain Relieving Factors being real still for awhile    Effect of Pain on Daily Activities none             [facility] PT Assessment - 11/17/15 0001    Observation/Other Assessments   Observations no significant SI rotation noted today in supine                      [facility] Adult PT Treatment/Exercise -  11/17/15 0001    Lumbar Exercises: Stretches   Active Hamstring Stretch 3 reps;30 seconds   Active Hamstring Stretch Limitations 12 inch box    Single Knee to Chest Stretch 5 reps;10 seconds   Lower Trunk Rotation 5 reps;10 seconds   Piriformis Stretch 3 reps;30 seconds   Piriformis Stretch Limitations supine    Lumbar Exercises: Standing   Other Standing Lumbar Exercises 3D hip excursions 1x15   Lumbar Exercises: Seated   Other Seated Lumbar Exercises butterfly stretch 2x30 seconds    Manual Therapy   Manual Therapy Soft tissue mobilization;Muscle Energy Technique   Manual therapy comments manual complete separate rest of treatment   Soft tissue mobilization manual to L TFL/ITB and L glutes/piriformis  massage ball                 PT Education - 11/17/15 1111    Education provided No           PT Short Term Goals - 10/27/15 1215    PT SHORT TERM GOAL #1   Title Patient to demosntrate full ROM of bilateral hips and lumbar spine in order to reduce pain and symptoms, improve tolerance to activitiy    Time 4   Period Weeks   Status New   PT SHORT TERM GOAL #2   Title Patient to report she has been able to ambulate and statically stand for at least 30 minutes with no exacerbation of her pain and symptoms in order to demonstrate improved functional task performance skills    Time 4   Period Weeks   Status New   PT SHORT TERM GOAL #3   Title Patient to demonstrate proper lifting strategies with load up to 20# in order to prevent exacerbation of symptoms and to reduce chances of recurrence of injury    Time 4   Period Weeks   Status New   PT SHORT TERM GOAL #4   Title Patient to be independent in correctly and consistently performing appropriate HEP, to be updated PRN    Time 4   Period Weeks   Status New           PT Long Term Goals - 10/27/15 1217    PT LONG TERM GOAL #1   Title Patient to demonstrate no LLD within the past 4 weeks and will be independent in self-correction strategies in order to enhance independence in reducing pain adn managing symtpoms    Time 8   Period Weeks   Status New   PT LONG TERM GOAL #2   Title Patient to demonstrate strength 5/5 in all tested muscle groups in order to reduce pain and improve regional stability    Time 8   Period Weeks   Status New   PT LONG TERM GOAL #3   Title Patient to be able to ambulate and statically stand for at least 60 minutes with no exacerbation of pain or symptoms in order to enhance task performance in community and at home    Time 8   Period Weeks   Status New   PT LONG TERM GOAL #4   Title Patient to report she has been able to perform at leaset 20 minutse of light intensity physical activity with no exacerbation of pain or symptoms in order to assist in improving overall health and QOL    Time 8    Period Weeks   Status New  Plan - 11/17/15 1049    Clinical Impression Statement Patient arrives reporting that she is continuing to have constant soreness and tightness, more trouble getting up from seated position recently. Focused today's treatemnt on functional stretching and also checked SI alignment today as well. Noted increased hip musculature tightness today as evidenced by difficulty in performing butterfly and seated piriformis stretches today.  Introduced manual soft tissue moblization of L TFL/ITB and L glutes/piriformis as well, which were quite tense and knotted.    Rehab Potential Good   PT Frequency 2x / week   PT Duration 8 weeks   PT Treatment/Interventions ADLs/Self Care Home Management;Biofeedback;Cryotherapy;Moist Heat;Gait training;Stair training;Functional mobility training;Therapeutic activities;Therapeutic exercise;Balance training;Neuromuscular re-education;Patient/family education;Manual techniques;Passive range of motion;Taping   PT Next Visit Plan focus on functional stretching and soft tissue moblization for next couople sessions; check SI alignment; proximal and core strengthening    PT Home Exercise Plan updated.    Consulted and Agree with Plan of Care Patient      Patient will benefit from skilled therapeutic intervention in order to improve the following deficits and impairments:  Abnormal gait, Hypomobility, Decreased strength, Pain, Difficulty walking, Decreased activity tolerance, Increased muscle spasms, Improper body mechanics, Decreased coordination, Impaired flexibility, Postural dysfunction  Visit Diagnosis: Left-sided low back pain with left-sided sciatica  Stiffness of left hip, not elsewhere classified  Difficulty in walking, not elsewhere classified  Muscle weakness (generalized)  Abnormal posture     Problem List Patient Active Problem List   Diagnosis Date Noted  . Iron deficiency 02/18/2014  . Adenocarcinoma  of transverse colon ([place]) 12/29/2013  . Mass of pancreas 12/08/2013    Deniece Ree PT, DPT [facility] [address] [place], [place], [address] Phone: [phone]   Fax:  [phone]  Name: Amanda Norris MRN: [id number] Date of Birth: 12/04/1945

## 2015-11-22 ENCOUNTER — Ambulatory Visit (HOSPITAL_COMMUNITY): Payer: Medicare Other | Admitting: Physical Therapy

## 2015-11-22 DIAGNOSIS — R293 Abnormal posture: Secondary | ICD-10-CM

## 2015-11-22 DIAGNOSIS — M5442 Lumbago with sciatica, left side: Secondary | ICD-10-CM | POA: Diagnosis not present

## 2015-11-22 DIAGNOSIS — R262 Difficulty in walking, not elsewhere classified: Secondary | ICD-10-CM | POA: Diagnosis not present

## 2015-11-22 DIAGNOSIS — M25652 Stiffness of left hip, not elsewhere classified: Secondary | ICD-10-CM | POA: Diagnosis not present

## 2015-11-22 DIAGNOSIS — M6281 Muscle weakness (generalized): Secondary | ICD-10-CM

## 2015-11-22 NOTE — Therapy (Signed)
[facility] [address] [place], [place], [address] Phone: [phone]   Fax:  [phone]  Physical Therapy Treatment (Re-Assessment)  Patient Details  Name: Amanda Norris MRN: [id number] Date of Birth: Dec 17, 1945 Referring Provider: Arther Abbott   Encounter Date: 11/22/2015      PT End of Session - 11/22/15 1115    Visit Number 7   Number of Visits 15   Date for PT Re-Evaluation 12/20/15   Authorization Type Medicare (G-codes done 7th session)   Authorization Time Period 10/27/15 to 12/27/15   Authorization - Visit Number 7   Authorization - Number of Visits 17   PT Start Time 9211   PT Stop Time 1112   PT Time Calculation (min) 43 min   Activity Tolerance Patient tolerated treatment well   Behavior During Therapy [facility] for tasks assessed/performed      Past Medical History  Diagnosis Date  . PONV (postoperative nausea and vomiting)   . Colon cancer ([place])   . Arthritis     wrist and thumbs  . Sciatica of left side     Past Surgical History  Procedure Laterality Date  . Herniated disc      1996-c5-c7  . Colonoscopy N/A 12/12/2013    Procedure: COLONOSCOPY;  Surgeon: Rogene Houston, MD;  [facility];  Service: Endoscopy;  Laterality: N/A;  730  . Eus N/A 12/18/2013    Procedure: UPPER ENDOSCOPIC ULTRASOUND (EUS) LINEAR;  Surgeon: Milus Banister, MD;  [facility];  Service: Endoscopy;  Laterality: N/A;  . Partial colectomy N/A 12/29/2013    Procedure: RIGHT HEMICOLECTOMY;  Surgeon: Jamesetta So, MD;  Location: [facility];  Service: General;  Laterality: N/A;  . Portacath placement Right 02/04/2014    Procedure: INSERTION PORT-A-CATH;  Surgeon: Jamesetta So, MD;  Location: [facility];  Service: General;  Laterality: Right;  . Port-a-cath removal Right 04/01/2014    Procedure: MINOR REMOVAL PORT-A-CATH;  Surgeon: Jamesetta So, MD;  Location: [facility];  Service: General;  Laterality: Right;  . Colonoscopy N/A  01/20/2015    Procedure: COLONOSCOPY;  Surgeon: Rogene Houston, MD;  [facility];  Service: Endoscopy;  Laterality: N/A;  1030    There were no vitals filed for this visit.      Subjective Assessment - 11/22/15 1030    Subjective Patient reports that physically, all over, she is feeling better but she really believes that the gabapentin is making the biggest difference. Her pain still comes back if she stays up too long. She has been doing everything she needs to do around teh house recently; however she does have a hard time getting back up from the floor. No falls or close calls recently.    Pertinent History hx of colon cancer, hx of C5-7 herniated disc and surgeries    How long can you sit comfortably? 5/15- no limits    How long can you stand comfortably? 5/15- 10-15 minutes    How long can you walk comfortably? 5/15- 10-15 minutes   Patient Stated Goals be pain free, be able to be more active    Currently in Pain? No/denies            [facility] PT Assessment - 11/22/15 0001    Observation/Other Assessments   Observations no significant SI rotation noted today in supine    Focus on Therapeutic Outcomes (FOTO)  42% limited    AROM   Overall AROM  Comments improved but still stiff in hip ER and ADD musculature    Lumbar Flexion 69   Lumbar Extension 16   Lumbar - Right Side Bend fingers just past midline of knee ioint    Lumbar - Left Side Bend fingers just past midline of knee joint    Strength   Right Hip Flexion 5/5   Right Hip Extension 4/5   Right Hip ABduction 5/5   Left Hip Flexion 5/5   Left Hip Extension 3/5   Left Hip ABduction 4/5   Right Knee Flexion 5/5   Right Knee Extension 5/5   Left Knee Flexion 5/5   Left Knee Extension 5/5   Right Ankle Dorsiflexion 5/5   Left Ankle Dorsiflexion 5/5   6 minute walk test results    Aerobic Endurance Distance Walked 1336   Endurance additional comments 6MWT                      [facility] Adult PT  Treatment/Exercise - 11/22/15 0001    Lumbar Exercises: Stretches   Active Hamstring Stretch 3 reps;30 seconds   Active Hamstring Stretch Limitations 12 inch box    Single Knee to Chest Stretch 5 reps;10 seconds   Lower Trunk Rotation 5 reps;10 seconds   Lumbar Exercises: Standing   Functional Squats 10 reps   Functional Squats Limitations green ball assist   cues for form                PT Education - 11/22/15 1114    Education provided Yes   Education Details progress with skilled PT services, plan of care moving forward; educated about [employer] and benefits of water exercise; proper functional lifting mechanics    Person(s) Educated Patient   Methods Explanation   Comprehension Verbalized understanding          PT Short Term Goals - 11/22/15 1051    PT SHORT TERM GOAL #1   Title Patient to demosntrate full ROM of bilateral hips and lumbar spine in order to reduce pain and symptoms, improve tolerance to activitiy    Baseline 5/15- improving but continues to be limited especially in hips    Time 4   Period Weeks   Status On-going   PT SHORT TERM GOAL #2   Title Patient to report she has been able to ambulate and statically stand for at least 30 minutes with no exacerbation of her pain and symptoms in order to demonstrate improved functional task performance skills    Baseline 5/15- continues to report 10-15 minute limit    Time 4   Period Weeks   Status On-going   PT SHORT TERM GOAL #3   Title Patient to demonstrate proper lifting strategies with load up to 20# in order to prevent exacerbation of symptoms and to reduce chances of recurrence of injury    Baseline 5/15- close but required some education, requires ongoing training    Time 4   Period Weeks   Status On-going   PT SHORT TERM GOAL #4   Title Patient to be independent in correctly and consistently performing appropriate HEP, to be updated PRN    Baseline 5/15- doing every day    Time 4   Period Weeks    Status Achieved           PT Long Term Goals - 11/22/15 1055    PT LONG TERM GOAL #1   Title Patient to demonstrate no LLD within the past 4  weeks and will be independent in self-correction strategies in order to enhance independence in reducing pain adn managing symtpoms    Baseline 2022/12/08- no LLD past two sessions, has not been taught self corrctions yet    Time 8   Period Weeks   Status On-going   PT LONG TERM GOAL #2   Title Patient to demonstrate strength 5/5 in all tested muscle groups in order to reduce pain and improve regional stability    Baseline 12-08-2022- improving, continues to lack strength in some key muscles however    Time 8   Period Weeks   Status Partially Met   PT LONG TERM GOAL #3   Title Patient to be able to ambulate and statically stand for at least 60 minutes with no exacerbation of pain or symptoms in order to enhance task performance in community and at home    Time 8   Period Weeks   Status On-going   PT LONG TERM GOAL #4   Title Patient to report she has been able to perform at leaset 20 minutse of light intensity physical activity with no exacerbation of pain or symptoms in order to assist in improving overall health and QOL    Baseline 12/08/22- patient reports that she has not tried yet    Time 8   Period Weeks   Status On-going               Plan - 08-Dec-2015 1100    Clinical Impression Statement Re-assessment performed today. Patient shows improved gait quality and tolerance today, some mild improvements in functional strength, and some improvements in functional posture and back/hip mobility. However patient does continue to demonstrate some stiffness especially in her hips, impaired functional lifting form, and reduced toelrance to extended functional activities at this time. At this point recommend continuation of skilled PT services to address remaining impairments and to assist in developing appropriate advanced HEP for use after DC.    Rehab  Potential Good   PT Frequency 2x / week   PT Duration 4 weeks   PT Treatment/Interventions ADLs/Self Care Home Management;Biofeedback;Cryotherapy;Moist Heat;Gait training;Stair training;Functional mobility training;Therapeutic activities;Therapeutic exercise;Balance training;Neuromuscular re-education;Patient/family education;Manual techniques;Passive range of motion;Taping   PT Next Visit Plan functional stretching and soft tissue moblization; check SI alignment; proximal and core strengthening. Work on functional lifting and translation of functional mechanics into every day activities. Give pedometer.    Consulted and Agree with Plan of Care Patient      Patient will benefit from skilled therapeutic intervention in order to improve the following deficits and impairments:  Abnormal gait, Hypomobility, Decreased strength, Pain, Difficulty walking, Decreased activity tolerance, Increased muscle spasms, Improper body mechanics, Decreased coordination, Impaired flexibility, Postural dysfunction  Visit Diagnosis: Left-sided low back pain with left-sided sciatica  Stiffness of left hip, not elsewhere classified  Difficulty in walking, not elsewhere classified  Muscle weakness (generalized)  Abnormal posture       G-Codes - Dec 08, 2015 1117    Functional Assessment Tool Used FOTO 42% limited    Functional Limitation Mobility: Walking and moving around   Mobility: Walking and Moving Around Current Status [phone]) At least 40 percent but less than 60 percent impaired, limited or restricted   Mobility: Walking and Moving Around Goal Status [phone]) At least 20 percent but less than 40 percent impaired, limited or restricted      Problem List Patient Active Problem List   Diagnosis Date Noted  . Iron deficiency 02/18/2014  . Adenocarcinoma of transverse  colon ([place]) 12/29/2013  . Mass of pancreas 12/08/2013    Deniece Ree PT, DPT [facility] [address] [place], [place], [address] Phone: [phone]   Fax:  [phone]  Name: Amanda Norris MRN: [id number] Date of Birth: Oct 11, 1945

## 2015-11-24 ENCOUNTER — Ambulatory Visit (HOSPITAL_COMMUNITY): Payer: Medicare Other

## 2015-11-24 DIAGNOSIS — R293 Abnormal posture: Secondary | ICD-10-CM

## 2015-11-24 DIAGNOSIS — M25652 Stiffness of left hip, not elsewhere classified: Secondary | ICD-10-CM | POA: Diagnosis not present

## 2015-11-24 DIAGNOSIS — M5442 Lumbago with sciatica, left side: Secondary | ICD-10-CM

## 2015-11-24 DIAGNOSIS — M6281 Muscle weakness (generalized): Secondary | ICD-10-CM

## 2015-11-24 DIAGNOSIS — R262 Difficulty in walking, not elsewhere classified: Secondary | ICD-10-CM

## 2015-11-24 NOTE — Therapy (Signed)
[facility] [place], [place], [address] Phone: [phone]   Fax:  [phone]  Physical Therapy Treatment  Patient Details  Name: Amanda Norris MRN: [id number] Date of Birth: 1945/12/13 Referring Provider: Arther Abbott   Encounter Date: 11/24/2015      PT End of Session - 11/24/15 1042    Visit Number 8   Number of Visits 15   Date for PT Re-Evaluation 12/20/15   Authorization Type Medicare (G-codes done 7th session)   Authorization Time Period 10/27/15 to 12/27/15   Authorization - Visit Number 8   Authorization - Number of Visits 17   PT Start Time 1829   PT Stop Time 1118   PT Time Calculation (min) 40 min   Activity Tolerance Patient tolerated treatment well   Behavior During Therapy [facility] for tasks assessed/performed      Past Medical History  Diagnosis Date  . PONV (postoperative nausea and vomiting)   . Colon cancer ([place])   . Arthritis     wrist and thumbs  . Sciatica of left side     Past Surgical History  Procedure Laterality Date  . Herniated disc      1996-c5-c7  . Colonoscopy N/A 12/12/2013    Procedure: COLONOSCOPY;  Surgeon: Rogene Houston, MD;  [facility];  Service: Endoscopy;  Laterality: N/A;  730  . Eus N/A 12/18/2013    Procedure: UPPER ENDOSCOPIC ULTRASOUND (EUS) LINEAR;  Surgeon: Milus Banister, MD;  [facility];  Service: Endoscopy;  Laterality: N/A;  . Partial colectomy N/A 12/29/2013    Procedure: RIGHT HEMICOLECTOMY;  Surgeon: Jamesetta So, MD;  Location: [facility];  Service: General;  Laterality: N/A;  . Portacath placement Right 02/04/2014    Procedure: INSERTION PORT-A-CATH;  Surgeon: Jamesetta So, MD;  Location: [facility];  Service: General;  Laterality: Right;  . Port-a-cath removal Right 04/01/2014    Procedure: MINOR REMOVAL PORT-A-CATH;  Surgeon: Jamesetta So, MD;  Location: [facility];  Service: General;  Laterality: Right;  . Colonoscopy N/A 01/20/2015     Procedure: COLONOSCOPY;  Surgeon: Rogene Houston, MD;  [facility];  Service: Endoscopy;  Laterality: N/A;  1030    There were no vitals filed for this visit.      Subjective Assessment - 11/24/15 1038    Subjective Pt stated she is feeling good today, reports ability to go out in yard and pick weeds yesterday with no problems.  No reports of radicular symptoms today.   Pertinent History hx of colon cancer, hx of C5-7 herniated disc and surgeries    Patient Stated Goals be pain free, be able to be more active    Currently in Pain? No/denies              [facility] Adult PT Treatment/Exercise - 11/24/15 0001    Balance Poses: Yoga   Tree Pose 2 reps  2x 20"   Lumbar Exercises: Stretches   Active Hamstring Stretch 3 reps;30 seconds   Active Hamstring Stretch Limitations 12 inch box    Lower Trunk Rotation 5 reps;10 seconds   Piriformis Stretch 3 reps;30 seconds   Piriformis Stretch Limitations knee to opposite chest   Lumbar Exercises: Standing   Functional Squats 10 reps   Functional Squats Limitations green ball assist    Lifting From 12";10 reps   Lifting Limitations yellow ball   Lumbar Exercises: Quadruped   Straight Leg Raise 5 reps  stopped due to arthritis in wrist   Plank 3x 10" on elbows   Manual Therapy   Muscle Energy Technique checked SI alignment, no MET complete this session                  PT Short Term Goals - 11/22/15 1051    PT SHORT TERM GOAL #1   Title Patient to demosntrate full ROM of bilateral hips and lumbar spine in order to reduce pain and symptoms, improve tolerance to activitiy    Baseline 5/15- improving but continues to be limited especially in hips    Time 4   Period Weeks   Status On-going   PT SHORT TERM GOAL #2   Title Patient to report she has been able to ambulate and statically stand for at least 30 minutes with no exacerbation of her pain and symptoms in order to demonstrate improved functional task  performance skills    Baseline 5/15- continues to report 10-15 minute limit    Time 4   Period Weeks   Status On-going   PT SHORT TERM GOAL #3   Title Patient to demonstrate proper lifting strategies with load up to 20# in order to prevent exacerbation of symptoms and to reduce chances of recurrence of injury    Baseline 5/15- close but required some education, requires ongoing training    Time 4   Period Weeks   Status On-going   PT SHORT TERM GOAL #4   Title Patient to be independent in correctly and consistently performing appropriate HEP, to be updated PRN    Baseline 5/15- doing every day    Time 4   Period Weeks   Status Achieved           PT Long Term Goals - 11/22/15 1055    PT LONG TERM GOAL #1   Title Patient to demonstrate no LLD within the past 4 weeks and will be independent in self-correction strategies in order to enhance independence in reducing pain adn managing symtpoms    Baseline 5/15- no LLD past two sessions, has not been taught self corrctions yet    Time 8   Period Weeks   Status On-going   PT LONG TERM GOAL #2   Title Patient to demonstrate strength 5/5 in all tested muscle groups in order to reduce pain and improve regional stability    Baseline 5/15- improving, continues to lack strength in some key muscles however    Time 8   Period Weeks   Status Partially Met   PT LONG TERM GOAL #3   Title Patient to be able to ambulate and statically stand for at least 60 minutes with no exacerbation of pain or symptoms in order to enhance task performance in community and at home    Time 8   Period Weeks   Status On-going   PT LONG TERM GOAL #4   Title Patient to report she has been able to perform at leaset 20 minutse of light intensity physical activity with no exacerbation of pain or symptoms in order to assist in improving overall health and QOL    Baseline 5/15- patient reports that she has not tried yet    Time 8   Period Weeks   Status On-going                Plan - 11/24/15 1110    Clinical Impression Statement SI slightly out of alignment though no reports of pain or radicular symptoms and  no significant leg length discrepancy, no muscle energy technqiue complete this session.  Progressed core and proximal musculature, trial with quadruped exercises for core stability though stopped early due to c/o arthritic pain in Rt wrist..   Added standard plank for core stability and instructed proper body mechanics with lifting with min verbal cueing and demonstration for proper mechanics.  Discussed walking program to improve activity tolerance, pt given pedometer though left it on therapist's desk at end of session.  Please give to her next session.     Rehab Potential Good   PT Frequency 2x / week   PT Duration 4 weeks   PT Treatment/Interventions ADLs/Self Care Home Management;Biofeedback;Cryotherapy;Moist Heat;Gait training;Stair training;Functional mobility training;Therapeutic activities;Therapeutic exercise;Balance training;Neuromuscular re-education;Patient/family education;Manual techniques;Passive range of motion;Taping   PT Next Visit Plan functional stretching and soft tissue moblization; check SI alignment; proximal and core strengthening. Work on functional lifting and translation of functional mechanics into every day activities. Give pedometer.       Patient will benefit from skilled therapeutic intervention in order to improve the following deficits and impairments:  Abnormal gait, Hypomobility, Decreased strength, Pain, Difficulty walking, Decreased activity tolerance, Increased muscle spasms, Improper body mechanics, Decreased coordination, Impaired flexibility, Postural dysfunction  Visit Diagnosis: Left-sided low back pain with left-sided sciatica  Stiffness of left hip, not elsewhere classified  Difficulty in walking, not elsewhere classified  Muscle weakness (generalized)  Abnormal posture     Problem  List Patient Active Problem List   Diagnosis Date Noted  . Iron deficiency 02/18/2014  . Adenocarcinoma of transverse colon ([place]) 12/29/2013  . Mass of pancreas 12/08/2013   Ihor [place], [place]; [place]  Aldona Lento 11/24/2015, 12:20 PM  [facility] [address] [place], [place], [address] Phone: [phone]   Fax:  [phone]  Name: Amanda Norris MRN: [id number] Date of Birth: [birth year]

## 2015-11-29 ENCOUNTER — Encounter (HOSPITAL_COMMUNITY): Payer: Medicare Other | Admitting: Physical Therapy

## 2015-12-01 ENCOUNTER — Ambulatory Visit (HOSPITAL_COMMUNITY): Payer: Medicare Other

## 2015-12-01 DIAGNOSIS — M6281 Muscle weakness (generalized): Secondary | ICD-10-CM

## 2015-12-01 DIAGNOSIS — R262 Difficulty in walking, not elsewhere classified: Secondary | ICD-10-CM | POA: Diagnosis not present

## 2015-12-01 DIAGNOSIS — M5442 Lumbago with sciatica, left side: Secondary | ICD-10-CM

## 2015-12-01 DIAGNOSIS — M25652 Stiffness of left hip, not elsewhere classified: Secondary | ICD-10-CM | POA: Diagnosis not present

## 2015-12-01 DIAGNOSIS — R293 Abnormal posture: Secondary | ICD-10-CM

## 2015-12-01 NOTE — Therapy (Signed)
[facility] [address] [place], [place], [address] Phone: [phone]   Fax:  [phone]  Physical Therapy Treatment  Patient Details  Name: Amanda Norris MRN: [id number] Date of Birth: [birth year] Referring Provider: Arther Abbott   Encounter Date: 12/01/2015      PT End of Session - 12/01/15 1050    Visit Number 9   Number of Visits 15   Date for PT Re-Evaluation 12/20/15   Authorization Type Medicare (G-codes done 7th session)   Authorization Time Period 10/27/15 to 12/27/15   Authorization - Visit Number 9   Authorization - Number of Visits 17   PT Start Time 0867   PT Stop Time 1114   PT Time Calculation (min) 39 min   Activity Tolerance Patient tolerated treatment well   Behavior During Therapy [facility] for tasks assessed/performed      Past Medical History  Diagnosis Date  . PONV (postoperative nausea and vomiting)   . Colon cancer ([place])   . Arthritis     wrist and thumbs  . Sciatica of left side     Past Surgical History  Procedure Laterality Date  . Herniated disc      1996-c5-c7  . Colonoscopy N/A 12/12/2013    Procedure: COLONOSCOPY;  Surgeon: Rogene Houston, MD;  [facility];  Service: Endoscopy;  Laterality: N/A;  730  . Eus N/A 12/18/2013    Procedure: UPPER ENDOSCOPIC ULTRASOUND (EUS) LINEAR;  Surgeon: Milus Banister, MD;  [facility];  Service: Endoscopy;  Laterality: N/A;  . Partial colectomy N/A 12/29/2013    Procedure: RIGHT HEMICOLECTOMY;  Surgeon: Jamesetta So, MD;  Location: [facility];  Service: General;  Laterality: N/A;  . Portacath placement Right 02/04/2014    Procedure: INSERTION PORT-A-CATH;  Surgeon: Jamesetta So, MD;  Location: [facility];  Service: General;  Laterality: Right;  . Port-a-cath removal Right 04/01/2014    Procedure: MINOR REMOVAL PORT-A-CATH;  Surgeon: Jamesetta So, MD;  Location: [facility];  Service: General;  Laterality: Right;  . Colonoscopy N/A 01/20/2015   Procedure: COLONOSCOPY;  Surgeon: Rogene Houston, MD;  [facility];  Service: Endoscopy;  Laterality: N/A;  1030    There were no vitals filed for this visit.      Subjective Assessment - 12/01/15 1041    Subjective Pt reports she went fo15 minute walk earlier today, pain minimal on left lower back, pain scale .5/10   Pertinent History hx of colon cancer, hx of C5-7 herniated disc and surgeries    Patient Stated Goals be pain free, be able to be more active    Currently in Pain? Yes   Pain Score 1    Pain Location Back   Pain Orientation Left;Lower   Pain Descriptors / Indicators Aching   Pain Type Acute pain   Pain Radiating Towards none   Pain Onset More than a month ago   Pain Frequency Intermittent   Aggravating Factors  moving around    Pain Relieving Factors being real still for awhile   Effect of Pain on Daily Activities none               [facility] Adult PT Treatment/Exercise - 12/01/15 0001    Balance Poses: Yoga   Warrior I 1 rep;30 seconds   Tree Pose 3 reps  3x 20"   Lumbar Exercises: Stretches   Active Hamstring Stretch 3 reps;30 seconds   Active Hamstring Stretch Limitations  supine with rope   Lower Trunk Rotation 5 reps;10 seconds   Piriformis Stretch 3 reps;30 seconds   Piriformis Stretch Limitations knee to opposite chest   Lumbar Exercises: Standing   Functional Squats 10 reps   Functional Squats Limitations 8# box   Lifting From 12";10 reps   Lifting Limitations 8# box   Other Standing Lumbar Exercises Vector stance 3x 5"   Lumbar Exercises: Sidelying   Other Sidelying Lumbar Exercises side planks 2x 5" holds   Lumbar Exercises: Quadruped   Plank 3x 10" on elbows   Manual Therapy   Muscle Energy Technique checked SI alignment, no MET complete this session                  PT Short Term Goals - 11/22/15 1051    PT SHORT TERM GOAL #1   Title Patient to demosntrate full ROM of bilateral hips and lumbar spine in order to  reduce pain and symptoms, improve tolerance to activitiy    Baseline 5/15- improving but continues to be limited especially in hips    Time 4   Period Weeks   Status On-going   PT SHORT TERM GOAL #2   Title Patient to report she has been able to ambulate and statically stand for at least 30 minutes with no exacerbation of her pain and symptoms in order to demonstrate improved functional task performance skills    Baseline 5/15- continues to report 10-15 minute limit    Time 4   Period Weeks   Status On-going   PT SHORT TERM GOAL #3   Title Patient to demonstrate proper lifting strategies with load up to 20# in order to prevent exacerbation of symptoms and to reduce chances of recurrence of injury    Baseline 5/15- close but required some education, requires ongoing training    Time 4   Period Weeks   Status On-going   PT SHORT TERM GOAL #4   Title Patient to be independent in correctly and consistently performing appropriate HEP, to be updated PRN    Baseline 5/15- doing every day    Time 4   Period Weeks   Status Achieved           PT Long Term Goals - 11/22/15 1055    PT LONG TERM GOAL #1   Title Patient to demonstrate no LLD within the past 4 weeks and will be independent in self-correction strategies in order to enhance independence in reducing pain adn managing symtpoms    Baseline 5/15- no LLD past two sessions, has not been taught self corrctions yet    Time 8   Period Weeks   Status On-going   PT LONG TERM GOAL #2   Title Patient to demonstrate strength 5/5 in all tested muscle groups in order to reduce pain and improve regional stability    Baseline 5/15- improving, continues to lack strength in some key muscles however    Time 8   Period Weeks   Status Partially Met   PT LONG TERM GOAL #3   Title Patient to be able to ambulate and statically stand for at least 60 minutes with no exacerbation of pain or symptoms in order to enhance task performance in community and  at home    Time 8   Period Weeks   Status On-going   PT LONG TERM GOAL #4   Title Patient to report she has been able to perform at leaset 20 minutse of light intensity physical  activity with no exacerbation of pain or symptoms in order to assist in improving overall health and QOL    Baseline 5/15- patient reports that she has not tried yet    Time 8   Period Weeks   Status On-going               Plan - 12/01/15 1137    Clinical Impression Statement SI within alignment, no muscle energy technqiues required this session.  Session focus on progressing core stabiltiy and reviewing proper body mechanics with lifting.  Continued with standard planks and added side planks to progress core activation as well as yoga poses.  End of session pt limited by faitgue, no reports of pain.  Pt given pedometer and encouraged to begin documenting steps per day to docement progress with increased steps per day.     Rehab Potential Good   PT Frequency 2x / week   PT Duration 4 weeks   PT Treatment/Interventions ADLs/Self Care Home Management;Biofeedback;Cryotherapy;Moist Heat;Gait training;Stair training;Functional mobility training;Therapeutic activities;Therapeutic exercise;Balance training;Neuromuscular re-education;Patient/family education;Manual techniques;Passive range of motion;Taping   PT Next Visit Plan functional stretching and soft tissue moblization; check SI alignment; proximal and core strengthening. Work on functional lifting and translation of functional mechanics into every day activities.       Patient will benefit from skilled therapeutic intervention in order to improve the following deficits and impairments:  Abnormal gait, Hypomobility, Decreased strength, Pain, Difficulty walking, Decreased activity tolerance, Increased muscle spasms, Improper body mechanics, Decreased coordination, Impaired flexibility, Postural dysfunction  Visit Diagnosis: Stiffness of left hip, not elsewhere  classified  Difficulty in walking, not elsewhere classified  Muscle weakness (generalized)  Abnormal posture  Left-sided low back pain with left-sided sciatica     Problem List Patient Active Problem List   Diagnosis Date Noted  . Iron deficiency 02/18/2014  . Adenocarcinoma of transverse colon ([place]) 12/29/2013  . Mass of pancreas 12/08/2013   Ihor Austin, [place]; [place]  Aldona Lento 12/01/2015, 12:18 PM  [facility] [address] [place], [place], [address] Phone: [phone]   Fax:  [phone]  Name: Amanda Norris MRN: [id number] Date of Birth: 07/24/1945    +

## 2015-12-07 ENCOUNTER — Encounter (HOSPITAL_COMMUNITY): Payer: Self-pay | Admitting: Physical Therapy

## 2015-12-07 ENCOUNTER — Ambulatory Visit (HOSPITAL_COMMUNITY): Payer: Medicare Other | Admitting: Physical Therapy

## 2015-12-07 DIAGNOSIS — R262 Difficulty in walking, not elsewhere classified: Secondary | ICD-10-CM | POA: Diagnosis not present

## 2015-12-07 DIAGNOSIS — M6281 Muscle weakness (generalized): Secondary | ICD-10-CM | POA: Diagnosis not present

## 2015-12-07 DIAGNOSIS — R293 Abnormal posture: Secondary | ICD-10-CM | POA: Diagnosis not present

## 2015-12-07 DIAGNOSIS — M5442 Lumbago with sciatica, left side: Secondary | ICD-10-CM | POA: Diagnosis not present

## 2015-12-07 DIAGNOSIS — M25652 Stiffness of left hip, not elsewhere classified: Secondary | ICD-10-CM | POA: Diagnosis not present

## 2015-12-07 NOTE — Therapy (Signed)
[facility] Amanda Norris, [place], [address] Phone: [phone]   Fax:  [phone]  Physical Therapy Treatment  Patient Details  Name: Amanda Norris MRN: [id number] Date of Birth: 30-Aug-1945 Referring Provider: Arther Abbott   Encounter Date: 12/07/2015      PT End of Session - 12/07/15 1352    Visit Number 10   Number of Visits 15   Date for PT Re-Evaluation 12/20/15   Authorization Type Medicare (G-codes done 7th session)   Authorization Time Period 10/27/15 to 12/27/15   Authorization - Visit Number 10   Authorization - Number of Visits 17   PT Start Time 1301   PT Stop Time 1345   PT Time Calculation (min) 44 min   Activity Tolerance Patient tolerated treatment well   Behavior During Therapy [facility] for tasks assessed/performed      Past Medical History  Diagnosis Date  . PONV (postoperative nausea and vomiting)   . Colon cancer ([place])   . Arthritis     wrist and thumbs  . Sciatica of left side     Past Surgical History  Procedure Laterality Date  . Herniated disc      1996-c5-c7  . Colonoscopy N/A 12/12/2013    Procedure: COLONOSCOPY;  Surgeon: Rogene Houston, MD;  [facility];  Service: Endoscopy;  Laterality: N/A;  730  . Eus N/A 12/18/2013    Procedure: UPPER ENDOSCOPIC ULTRASOUND (EUS) LINEAR;  Surgeon: Milus Banister, MD;  [facility];  Service: Endoscopy;  Laterality: N/A;  . Partial colectomy N/A 12/29/2013    Procedure: RIGHT HEMICOLECTOMY;  Surgeon: Jamesetta So, MD;  Location: [facility];  Service: General;  Laterality: N/A;  . Portacath placement Right 02/04/2014    Procedure: INSERTION PORT-A-CATH;  Surgeon: Jamesetta So, MD;  Location: [facility];  Service: General;  Laterality: Right;  . Port-a-cath removal Right 04/01/2014    Procedure: MINOR REMOVAL PORT-A-CATH;  Surgeon: Jamesetta So, MD;  Location: [facility];  Service: General;  Laterality: Right;  . Colonoscopy N/A 01/20/2015   Procedure: COLONOSCOPY;  Surgeon: Rogene Houston, MD;  [facility];  Service: Endoscopy;  Laterality: N/A;  1030    There were no vitals filed for this visit.      Subjective Assessment - 12/07/15 1305    Subjective Pt reports she is "getting by". She walked a mile the other day, but had to call someone to pick her up. She has been doing some of her exercises occasionally. She worked on [employer] of squatting and lifting and doesn't want to do it again. She says she doesn't have to do any lifting like that at home.   Pertinent History hx of colon cancer, hx of C5-7 herniated disc and surgeries    Patient Stated Goals be pain free, be able to be more active    Currently in Pain? No/denies   Pain Onset More than a month ago                         [facility] Adult PT Treatment/Exercise - 12/07/15 0001    Exercises   Exercises Other Exercises   Other Exercises  standing hip adductor stretch 3x20 sec    Lumbar Exercises: Stretches   Piriformis Stretch 3 reps;30 seconds   Piriformis Stretch Limitations LLE only    Manual Therapy   Manual Therapy Soft tissue mobilization   Manual therapy comments  manual complete separate rest of treatment   Soft tissue mobilization TrP release to Lt adductor/piriformis                PT Education - 12/07/15 1350    Education provided Yes   Education Details discussed appropriate soreness after therex and ways to alleviate; importance of correct lifting mechanics; implications for manual treatment to improve soft tissue restrictions; importance of HEP adherence   Person(s) Educated Patient   Methods Explanation;Demonstration;Handout   Comprehension Verbalized understanding;Returned demonstration          PT Short Term Goals - 11/22/15 1051    PT SHORT TERM GOAL #1   Title Patient to demosntrate full ROM of bilateral hips and lumbar spine in order to reduce pain and symptoms, improve tolerance to activitiy    Baseline  5/15- improving but continues to be limited especially in hips    Time 4   Period Weeks   Status On-going   PT SHORT TERM GOAL #2   Title Patient to report she has been able to ambulate and statically stand for at least 30 minutes with no exacerbation of her pain and symptoms in order to demonstrate improved functional task performance skills    Baseline 5/15- continues to report 10-15 minute limit    Time 4   Period Weeks   Status On-going   PT SHORT TERM GOAL #3   Title Patient to demonstrate proper lifting strategies with load up to 20# in order to prevent exacerbation of symptoms and to reduce chances of recurrence of injury    Baseline 5/15- close but required some education, requires ongoing training    Time 4   Period Weeks   Status On-going   PT SHORT TERM GOAL #4   Title Patient to be independent in correctly and consistently performing appropriate HEP, to be updated PRN    Baseline 5/15- doing every day    Time 4   Period Weeks   Status Achieved           PT Long Term Goals - 11/22/15 1055    PT LONG TERM GOAL #1   Title Patient to demonstrate no LLD within the past 4 weeks and will be independent in self-correction strategies in order to enhance independence in reducing pain adn managing symtpoms    Baseline 5/15- no LLD past two sessions, has not been taught self corrctions yet    Time 8   Period Weeks   Status On-going   PT LONG TERM GOAL #2   Title Patient to demonstrate strength 5/5 in all tested muscle groups in order to reduce pain and improve regional stability    Baseline 5/15- improving, continues to lack strength in some key muscles however    Time 8   Period Weeks   Status Partially Met   PT LONG TERM GOAL #3   Title Patient to be able to ambulate and statically stand for at least 60 minutes with no exacerbation of pain or symptoms in order to enhance task performance in community and at home    Time 8   Period Weeks   Status On-going   PT LONG TERM  GOAL #4   Title Patient to report she has been able to perform at leaset 20 minutse of light intensity physical activity with no exacerbation of pain or symptoms in order to assist in improving overall health and QOL    Baseline 5/15- patient reports that she has not tried yet  Time 8   Period Weeks   Status On-going               Plan - 12/07/15 1604    Clinical Impression Statement Today's session focused on education and manual treatment to address noted pain and soft tissue restrictions throughout Lt adductors/piriformis. Pt continues to present with pain limiting her activity and therapist encouraged continued movement and HEP adherence to improve overall activity tolerance. Pt with no increase in pain by the end of today's session. Focus should be shifted towards treatment to address LE flexibility and muscle restrictions to improve pt's overall pain report and mobility.   Rehab Potential Good   PT Frequency 2x / week   PT Duration 4 weeks   PT Treatment/Interventions ADLs/Self Care Home Management;Biofeedback;Cryotherapy;Moist Heat;Gait training;Stair training;Functional mobility training;Therapeutic activities;Therapeutic exercise;Balance training;Neuromuscular re-education;Patient/family education;Manual techniques;Passive range of motion;Taping   PT Next Visit Plan functional stretching and soft tissue moblization; proximal and core strengthening. discuss benefits of aquatic exercise   PT Home Exercise Plan updated with piriformis/hip adductor stretch   Consulted and Agree with Plan of Care Patient      Patient will benefit from skilled therapeutic intervention in order to improve the following deficits and impairments:  Abnormal gait, Hypomobility, Decreased strength, Pain, Difficulty walking, Decreased activity tolerance, Increased muscle spasms, Improper body mechanics, Decreased coordination, Impaired flexibility, Postural dysfunction  Visit Diagnosis: Stiffness of left  hip, not elsewhere classified  Difficulty in walking, not elsewhere classified  Muscle weakness (generalized)  Abnormal posture  Left-sided low back pain with left-sided sciatica     Problem List Patient Active Problem List   Diagnosis Date Noted  . Iron deficiency 02/18/2014  . Adenocarcinoma of transverse colon ([place]) 12/29/2013  . Mass of pancreas 12/08/2013   4:13 PM,12/07/2015 Elly Modena PT, DPT [facility] [address] [place], [place], [address] Phone: [phone]   Fax:  [phone]  Name: SALISHA BARDSLEY MRN: [id number] Date of Birth: Mar 03, 1946

## 2015-12-08 ENCOUNTER — Ambulatory Visit (HOSPITAL_COMMUNITY): Payer: Medicare Other | Admitting: Physical Therapy

## 2015-12-08 DIAGNOSIS — M6281 Muscle weakness (generalized): Secondary | ICD-10-CM

## 2015-12-08 DIAGNOSIS — M5442 Lumbago with sciatica, left side: Secondary | ICD-10-CM

## 2015-12-08 DIAGNOSIS — R262 Difficulty in walking, not elsewhere classified: Secondary | ICD-10-CM | POA: Diagnosis not present

## 2015-12-08 DIAGNOSIS — M25652 Stiffness of left hip, not elsewhere classified: Secondary | ICD-10-CM

## 2015-12-08 DIAGNOSIS — R293 Abnormal posture: Secondary | ICD-10-CM | POA: Diagnosis not present

## 2015-12-08 NOTE — Therapy (Signed)
[place], [place], [address] Phone: [phone]   Fax:  [phone]  Physical Therapy Treatment  Patient Details  Name: Amanda Norris MRN: [id number] Date of Birth: 1946-05-16 Referring Provider: Arther Abbott   Encounter Date: 12/08/2015      PT End of Session - 12/08/15 1216    Visit Number 11   Number of Visits 15   Date for PT Re-Evaluation 12/20/15   Authorization Type Medicare (G-codes done 7th session)   Authorization Time Period 10/27/15 to 12/27/15   Authorization - Visit Number 11   Authorization - Number of Visits 17   PT Start Time 5427   PT Stop Time 1112   PT Time Calculation (min) 40 min   Activity Tolerance Patient tolerated treatment well   Behavior During Therapy [facility] for tasks assessed/performed      Past Medical History  Diagnosis Date  . PONV (postoperative nausea and vomiting)   . Colon cancer ([place])   . Arthritis     wrist and thumbs  . Sciatica of left side     Past Surgical History  Procedure Laterality Date  . Herniated disc      1996-c5-c7  . Colonoscopy N/A 12/12/2013    Procedure: COLONOSCOPY;  Surgeon: Rogene Houston, MD;  [facility];  Service: Endoscopy;  Laterality: N/A;  730  . Eus N/A 12/18/2013    Procedure: UPPER ENDOSCOPIC ULTRASOUND (EUS) LINEAR;  Surgeon: Milus Banister, MD;  [facility];  Service: Endoscopy;  Laterality: N/A;  . Partial colectomy N/A 12/29/2013    Procedure: RIGHT HEMICOLECTOMY;  Surgeon: Jamesetta So, MD;  Location: [facility];  Service: General;  Laterality: N/A;  . Portacath placement Right 02/04/2014    Procedure: INSERTION PORT-A-CATH;  Surgeon: Jamesetta So, MD;  Location: [facility];  Service: General;  Laterality: Right;  . Port-a-cath removal Right 04/01/2014    Procedure: MINOR REMOVAL PORT-A-CATH;  Surgeon: Jamesetta So, MD;  Location: [facility];  Service: General;  Laterality: Right;  . Colonoscopy N/A 01/20/2015   Procedure: COLONOSCOPY;  Surgeon: Rogene Houston, MD;  [facility];  Service: Endoscopy;  Laterality: N/A;  1030    There were no vitals filed for this visit.      Subjective Assessment - 12/08/15 1034    Subjective Patient arrives today painfree, stating she did have some pain running down into her foot earlier but this has worked itself out since.    Currently in Pain? No/denies                         [facility] Adult PT Treatment/Exercise - 12/08/15 0001    Lumbar Exercises: Stretches   Active Hamstring Stretch 3 reps;30 seconds   Active Hamstring Stretch Limitations stairs    Passive Hamstring Stretch 3 reps;30 seconds   Passive Hamstring Stretch Limitations slantboard    Lower Trunk Rotation 5 reps;10 seconds   Piriformis Stretch 3 reps;30 seconds   Lumbar Exercises: Standing   Other Standing Lumbar Exercises hip ADD stretch at stairs 2x30 seconds each    Lumbar Exercises: Supine   Ab Set 15 reps   AB Set Limitations 3 second holds    Bridge 15 reps   Straight Leg Raise 10 reps   Other Supine Lumbar Exercises hip abuction with red TB 1x10    Manual Therapy   Manual Therapy Soft tissue mobilization   Manual  therapy comments manual complete separate rest of treatment   Soft tissue mobilization trigger point release to L ADD and STM of L piriformis                 PT Education - 12/08/15 1216    Education provided No          PT Short Term Goals - 11/22/15 1051    PT SHORT TERM GOAL #1   Title Patient to demosntrate full ROM of bilateral hips and lumbar spine in order to reduce pain and symptoms, improve tolerance to activitiy    Baseline 5/15- improving but continues to be limited especially in hips    Time 4   Period Weeks   Status On-going   PT SHORT TERM GOAL #2   Title Patient to report she has been able to ambulate and statically stand for at least 30 minutes with no exacerbation of her pain and symptoms in order to  demonstrate improved functional task performance skills    Baseline 5/15- continues to report 10-15 minute limit    Time 4   Period Weeks   Status On-going   PT SHORT TERM GOAL #3   Title Patient to demonstrate proper lifting strategies with load up to 20# in order to prevent exacerbation of symptoms and to reduce chances of recurrence of injury    Baseline 5/15- close but required some education, requires ongoing training    Time 4   Period Weeks   Status On-going   PT SHORT TERM GOAL #4   Title Patient to be independent in correctly and consistently performing appropriate HEP, to be updated PRN    Baseline 5/15- doing every day    Time 4   Period Weeks   Status Achieved           PT Long Term Goals - 11/22/15 1055    PT LONG TERM GOAL #1   Title Patient to demonstrate no LLD within the past 4 weeks and will be independent in self-correction strategies in order to enhance independence in reducing pain adn managing symtpoms    Baseline 5/15- no LLD past two sessions, has not been taught self corrctions yet    Time 8   Period Weeks   Status On-going   PT LONG TERM GOAL #2   Title Patient to demonstrate strength 5/5 in all tested muscle groups in order to reduce pain and improve regional stability    Baseline 5/15- improving, continues to lack strength in some key muscles however    Time 8   Period Weeks   Status Partially Met   PT LONG TERM GOAL #3   Title Patient to be able to ambulate and statically stand for at least 60 minutes with no exacerbation of pain or symptoms in order to enhance task performance in community and at home    Time 8   Period Weeks   Status On-going   PT LONG TERM GOAL #4   Title Patient to report she has been able to perform at leaset 20 minutse of light intensity physical activity with no exacerbation of pain or symptoms in order to assist in improving overall health and QOL    Baseline 5/15- patient reports that she has not tried yet    Time 8    Period Weeks   Status On-going               Plan - 12/08/15 1216    Clinical Impression Statement Continued  with functional stretching and soft tissue mobiilty, however did also perform some functional core/proximal muscle stabilization and strengthening during todays session. Ended session with manual to painful L hip adductors as well as L piriformis in order to assist in reducing overall discomfort today. No increase in pain at end of session.    Rehab Potential Good   PT Frequency 2x / week   PT Duration 4 weeks   PT Treatment/Interventions ADLs/Self Care Home Management;Biofeedback;Cryotherapy;Moist Heat;Gait training;Stair training;Functional mobility training;Therapeutic activities;Therapeutic exercise;Balance training;Neuromuscular re-education;Patient/family education;Manual techniques;Passive range of motion;Taping   PT Next Visit Plan functional stretching and soft tissue moblization; proximal and core strengthening. discuss benefits of aquatic exercise   Consulted and Agree with Plan of Care Patient      Patient will benefit from skilled therapeutic intervention in order to improve the following deficits and impairments:  Abnormal gait, Hypomobility, Decreased strength, Pain, Difficulty walking, Decreased activity tolerance, Increased muscle spasms, Improper body mechanics, Decreased coordination, Impaired flexibility, Postural dysfunction  Visit Diagnosis: Stiffness of left hip, not elsewhere classified  Difficulty in walking, not elsewhere classified  Muscle weakness (generalized)  Abnormal posture  Left-sided low back pain with left-sided sciatica     Problem List Patient Active Problem List   Diagnosis Date Noted  . Iron deficiency 02/18/2014  . Adenocarcinoma of transverse colon ([place]) 12/29/2013  . Mass of pancreas 12/08/2013    Deniece Ree PT, DPT [place] [address] [place],  [place], [address] Phone: [phone]   Fax:  [phone]  Name: Amanda Norris MRN: [id number] Date of Birth: 1946/02/02

## 2015-12-09 DIAGNOSIS — H43813 Vitreous degeneration, bilateral: Secondary | ICD-10-CM | POA: Diagnosis not present

## 2015-12-09 DIAGNOSIS — H5213 Myopia, bilateral: Secondary | ICD-10-CM | POA: Diagnosis not present

## 2015-12-09 DIAGNOSIS — H52223 Regular astigmatism, bilateral: Secondary | ICD-10-CM | POA: Diagnosis not present

## 2015-12-09 DIAGNOSIS — H524 Presbyopia: Secondary | ICD-10-CM | POA: Diagnosis not present

## 2015-12-13 ENCOUNTER — Ambulatory Visit (HOSPITAL_COMMUNITY): Payer: Medicare Other | Attending: Orthopedic Surgery | Admitting: Physical Therapy

## 2015-12-13 DIAGNOSIS — R293 Abnormal posture: Secondary | ICD-10-CM | POA: Insufficient documentation

## 2015-12-13 DIAGNOSIS — R262 Difficulty in walking, not elsewhere classified: Secondary | ICD-10-CM

## 2015-12-13 DIAGNOSIS — M6281 Muscle weakness (generalized): Secondary | ICD-10-CM | POA: Diagnosis not present

## 2015-12-13 DIAGNOSIS — M5442 Lumbago with sciatica, left side: Secondary | ICD-10-CM | POA: Insufficient documentation

## 2015-12-13 DIAGNOSIS — M25652 Stiffness of left hip, not elsewhere classified: Secondary | ICD-10-CM | POA: Diagnosis not present

## 2015-12-13 NOTE — Therapy (Signed)
[place], [place], [address] Phone: [phone]   Fax:  [phone]  Physical Therapy Treatment  Patient Details  Name: Amanda Norris MRN: [id number] Date of Birth: October 17, 1945 Referring Provider: Arther Abbott   Encounter Date: 12/13/2015      PT End of Session - 12/13/15 1216    Visit Number 12   Number of Visits 15   Date for PT Re-Evaluation 12/20/15   Authorization Type Medicare (G-codes done 7th session)   Authorization Time Period 10/27/15 to 12/27/15   Authorization - Visit Number 12   Authorization - Number of Visits 17   PT Start Time 1030   PT Stop Time 1112   PT Time Calculation (min) 42 min   Activity Tolerance Patient tolerated treatment well   Behavior During Therapy [facility] for tasks assessed/performed      Past Medical History  Diagnosis Date  . PONV (postoperative nausea and vomiting)   . Colon cancer ([place])   . Arthritis     wrist and thumbs  . Sciatica of left side     Past Surgical History  Procedure Laterality Date  . Herniated disc      1996-c5-c7  . Colonoscopy N/A 12/12/2013    Procedure: COLONOSCOPY;  Surgeon: Rogene Houston, MD;  [facility];  Service: Endoscopy;  Laterality: N/A;  730  . Eus N/A 12/18/2013    Procedure: UPPER ENDOSCOPIC ULTRASOUND (EUS) LINEAR;  Surgeon: Milus Banister, MD;  [facility];  Service: Endoscopy;  Laterality: N/A;  . Partial colectomy N/A 12/29/2013    Procedure: RIGHT HEMICOLECTOMY;  Surgeon: Jamesetta So, MD;  Location: [facility];  Service: General;  Laterality: N/A;  . Portacath placement Right 02/04/2014    Procedure: INSERTION PORT-A-CATH;  Surgeon: Jamesetta So, MD;  Location: [facility];  Service: General;  Laterality: Right;  . Port-a-cath removal Right 04/01/2014    Procedure: MINOR REMOVAL PORT-A-CATH;  Surgeon: Jamesetta So, MD;  Location: [facility];  Service: General;  Laterality: Right;  . Colonoscopy N/A 01/20/2015     Procedure: COLONOSCOPY;  Surgeon: Rogene Houston, MD;  [facility];  Service: Endoscopy;  Laterality: N/A;  1030    There were no vitals filed for this visit.      Subjective Assessment - 12/13/15 1032    Subjective Patient arrives today painfree and reporting that she felt fairly good after last session, no major complaints today. No falls or close calls recently.    Pertinent History hx of colon cancer, hx of C5-7 herniated disc and surgeries    Currently in Pain? No/denies                         [facility] Adult PT Treatment/Exercise - 12/13/15 0001    Lumbar Exercises: Stretches   Active Hamstring Stretch 3 reps;30 seconds   Active Hamstring Stretch Limitations 12 inch box   Passive Hamstring Stretch 3 reps;30 seconds   Passive Hamstring Stretch Limitations slantboard    Lower Trunk Rotation 5 reps;10 seconds   Lumbar Exercises: Standing   Heel Raises 10 reps   Heel Raises Limitations heel and toe    Other Standing Lumbar Exercises hip ADD stretch at stairs 3x30 seconds each    Other Standing Lumbar Exercises 3D hip excursions 1x15; functional lifting training with empty box    Lumbar Exercises: Supine   Ab Set 15 reps   AB  Set Limitations 5 second holds   Bridge 15 reps   Straight Leg Raise 15 reps   Other Supine Lumbar Exercises hip abuction with red TB 1x15   Lumbar Exercises: Prone   Straight Leg Raise 10 reps   Lumbar Exercises: Quadruped   Straight Leg Raise --   Manual Therapy   Manual Therapy Soft tissue mobilization   Manual therapy comments manual complete separate rest of treatment   Soft tissue mobilization STM L glutes/piriformis                 PT Education - 12/13/15 1214    Education provided Yes   Education Details importance of proper lifting strategies and application to daily life; possible DC next session    Person(s) Educated Patient   Methods Explanation   Comprehension Verbalized understanding           PT Short Term Goals - 11/22/15 1051    PT SHORT TERM GOAL #1   Title Patient to demosntrate full ROM of bilateral hips and lumbar spine in order to reduce pain and symptoms, improve tolerance to activitiy    Baseline 5/15- improving but continues to be limited especially in hips    Time 4   Period Weeks   Status On-going   PT SHORT TERM GOAL #2   Title Patient to report she has been able to ambulate and statically stand for at least 30 minutes with no exacerbation of her pain and symptoms in order to demonstrate improved functional task performance skills    Baseline 5/15- continues to report 10-15 minute limit    Time 4   Period Weeks   Status On-going   PT SHORT TERM GOAL #3   Title Patient to demonstrate proper lifting strategies with load up to 20# in order to prevent exacerbation of symptoms and to reduce chances of recurrence of injury    Baseline 5/15- close but required some education, requires ongoing training    Time 4   Period Weeks   Status On-going   PT SHORT TERM GOAL #4   Title Patient to be independent in correctly and consistently performing appropriate HEP, to be updated PRN    Baseline 5/15- doing every day    Time 4   Period Weeks   Status Achieved           PT Long Term Goals - 11/22/15 1055    PT LONG TERM GOAL #1   Title Patient to demonstrate no LLD within the past 4 weeks and will be independent in self-correction strategies in order to enhance independence in reducing pain adn managing symtpoms    Baseline 5/15- no LLD past two sessions, has not been taught self corrctions yet    Time 8   Period Weeks   Status On-going   PT LONG TERM GOAL #2   Title Patient to demonstrate strength 5/5 in all tested muscle groups in order to reduce pain and improve regional stability    Baseline 5/15- improving, continues to lack strength in some key muscles however    Time 8   Period Weeks   Status Partially Met   PT LONG TERM GOAL #3   Title Patient to be able  to ambulate and statically stand for at least 60 minutes with no exacerbation of pain or symptoms in order to enhance task performance in community and at home    Time 8   Period Weeks   Status On-going   PT LONG TERM  GOAL #4   Title Patient to report she has been able to perform at leaset 20 minutse of light intensity physical activity with no exacerbation of pain or symptoms in order to assist in improving overall health and QOL    Baseline 5/15- patient reports that she has not tried yet    Time 8   Period Weeks   Status On-going               Plan - 12/13/15 1046    Clinical Impression Statement Continued focus on functional stretching, soft tissue mobilty, and functional core/proximal musculature stablization and activation techniquse; also re-introduced some gentle standing exercises today to assist with carryover of core activation with CKC tasks at home. Otherwise performed soft tissue mobilzation techniques in order to continue reducing pain and improving general fucntion as a whole.  Patient demonstrates poor lifiting mechancis as well as poor awareness of why proper lifitng  mechanics are importance as well as poor motivation to learn correct, safe technique stating "I'd have someone elsedo this for me unless it was really light". Finsihed session with manual to L hip musculature with sore areas/knotting noted superior and inferior gluteal areas.    Rehab Potential Good   PT Frequency 2x / week   PT Duration 4 weeks   PT Treatment/Interventions ADLs/Self Care Home Management;Biofeedback;Cryotherapy;Moist Heat;Gait training;Stair training;Functional mobility training;Therapeutic activities;Therapeutic exercise;Balance training;Neuromuscular re-education;Patient/family education;Manual techniques;Passive range of motion;Taping   PT Next Visit Plan functional stretching and soft tissue moblization; proximal and core strengthening. discuss benefits of aquatic exercise. Possible DC at  upcoming session.    Consulted and Agree with Plan of Care Patient      Patient will benefit from skilled therapeutic intervention in order to improve the following deficits and impairments:  Abnormal gait, Hypomobility, Decreased strength, Pain, Difficulty walking, Decreased activity tolerance, Increased muscle spasms, Improper body mechanics, Decreased coordination, Impaired flexibility, Postural dysfunction  Visit Diagnosis: Stiffness of left hip, not elsewhere classified  Difficulty in walking, not elsewhere classified  Muscle weakness (generalized)  Abnormal posture  Left-sided low back pain with left-sided sciatica     Problem List Patient Active Problem List   Diagnosis Date Noted  . Iron deficiency 02/18/2014  . Adenocarcinoma of transverse colon ([place]) 12/29/2013  . Mass of pancreas 12/08/2013    Deniece Ree PT, DPT [place] [address] [place], [place], [address] Phone: [phone]   Fax:  [phone]  Name: Amanda Norris MRN: [id number] Date of Birth: [birth year]

## 2015-12-14 ENCOUNTER — Ambulatory Visit (INDEPENDENT_AMBULATORY_CARE_PROVIDER_SITE_OTHER): Payer: Medicare Other | Admitting: Orthopedic Surgery

## 2015-12-14 VITALS — BP 154/76 | HR 69 | Ht 63.0 in | Wt 172.8 lb

## 2015-12-14 DIAGNOSIS — M5126 Other intervertebral disc displacement, lumbar region: Secondary | ICD-10-CM

## 2015-12-14 NOTE — Patient Instructions (Signed)
CONTINUE GABAPENTIN

## 2015-12-15 ENCOUNTER — Ambulatory Visit (HOSPITAL_COMMUNITY): Payer: Medicare Other | Admitting: Physical Therapy

## 2015-12-15 DIAGNOSIS — M6281 Muscle weakness (generalized): Secondary | ICD-10-CM | POA: Diagnosis not present

## 2015-12-15 DIAGNOSIS — M5442 Lumbago with sciatica, left side: Secondary | ICD-10-CM | POA: Diagnosis not present

## 2015-12-15 DIAGNOSIS — M25652 Stiffness of left hip, not elsewhere classified: Secondary | ICD-10-CM | POA: Diagnosis not present

## 2015-12-15 DIAGNOSIS — R293 Abnormal posture: Secondary | ICD-10-CM | POA: Diagnosis not present

## 2015-12-15 DIAGNOSIS — R262 Difficulty in walking, not elsewhere classified: Secondary | ICD-10-CM

## 2015-12-15 NOTE — Progress Notes (Signed)
Patient ID: Amanda Norris, female   DOB: 12/07/1945, 70 y.o.   MRN: [id number]  Chief Complaint  Patient presents with  . Follow-up    Back left sided sciatica    HPI 70 year old female with a history of left lower back pain and sciatic nerve pain for several months. Her initial symptoms included pain numbness and tingling and burning radiating from the hip into the foot which was worse after activity  I gave her gabapentin and she went to physical therapy and her symptoms got better but she says that she still having similar symptoms    ROS red flags are negative as she is not having any bowel or bladder dysfunction no fever chills or night sweats   Past Medical History  Diagnosis Date  . PONV (postoperative nausea and vomiting)   . Colon cancer ([place])   . Arthritis     wrist and thumbs  . Sciatica of left side     BP 154/76 mmHg  Pulse 69  Ht 5\' 3"  (1.6 m)  Wt 172 lb 12.8 oz (78.382 kg)  BMI 30.62 kg/m2  Physical Exam Physical Exam  Constitutional: The patient appears well-developed and well-nourished. No distress.  The patient is oriented to person, place, and time.  Psychiatric: The patient has a normal mood and affect.  Cardiovascular: Intact distal pulses.   Neurological: sensation is normal  Skin: Skin is warm and dry. No rash noted. The patient is not diaphoretic. No erythema. No pallor.    Ortho Exam  She is ambulatory with no assistive devices and no disturbance in the gait pattern. The right left lower extremity exhibited normal muscle tone and strength including the calf dorsiflexion knee extension flexion and hip flexion.  She has a tender anterior compartment suggesting L5 root involvement but sensation is normal in each foot to pressure although she feels a tingling sensation on the left not on the right  Reflexes are 2+ at the ankle and knee  Straight leg raise on the left is positive  ASSESSMENT AND PLAN  70 year old female history of colon cancer  underwent physical therapy and take gabapentin with persistent radicular pain left leg  MRI ordered to assess the spine for possible epidural injections  Arther Abbott, MD 12/15/2015 1:54 PM

## 2015-12-15 NOTE — Therapy (Signed)
[facility] [address] [place], [place], [address] Phone: [phone]   Fax:  [phone]  Physical Therapy Treatment (Discharge)  Patient Details  Name: Amanda Norris MRN: [id number] Date of Birth: 01-17-1946 Referring Provider: Arther Abbott   Encounter Date: 12/15/2015      PT End of Session - 12/15/15 1239    Visit Number 13   Number of Visits 13   Authorization Type Medicare (G-codes done 13th session)   Authorization Time Period 10/27/15 to 12/27/15   Authorization - Visit Number 13   Authorization - Number of Visits 17   PT Start Time 1031   PT Stop Time 1100  no further skilled PT services needed    PT Time Calculation (min) 29 min   Activity Tolerance Patient tolerated treatment well   Behavior During Therapy [facility] for tasks assessed/performed      Past Medical History  Diagnosis Date  . PONV (postoperative nausea and vomiting)   . Colon cancer ([place])   . Arthritis     wrist and thumbs  . Sciatica of left side     Past Surgical History  Procedure Laterality Date  . Herniated disc      1996-c5-c7  . Colonoscopy N/A 12/12/2013    Procedure: COLONOSCOPY;  Surgeon: Rogene Houston, MD;  [facility]: [facility];  Service: Endoscopy;  Laterality: N/A;  730  . Eus N/A 12/18/2013    Procedure: UPPER ENDOSCOPIC ULTRASOUND (EUS) LINEAR;  Surgeon: Milus Banister, MD;  Location: [facility];  Service: Endoscopy;  Laterality: N/A;  . Partial colectomy N/A 12/29/2013    Procedure: RIGHT HEMICOLECTOMY;  Surgeon: Jamesetta So, MD;  Location: [facility];  Service: General;  Laterality: N/A;  . Portacath placement Right 02/04/2014    Procedure: INSERTION PORT-A-CATH;  Surgeon: Jamesetta So, MD;  Location: [facility];  Service: General;  Laterality: Right;  . Port-a-cath removal Right 04/01/2014    Procedure: MINOR REMOVAL PORT-A-CATH;  Surgeon: Jamesetta So, MD;  Location: [facility];  Service: General;  Laterality: Right;  . Colonoscopy N/A  01/20/2015    Procedure: COLONOSCOPY;  Surgeon: Rogene Houston, MD;  [facility]: [facility];  Service: Endoscopy;  Laterality: N/A;  1030    There were no vitals filed for this visit.      Subjective Assessment - 12/15/15 1033    Subjective Patient reports that she went to MD yesterday, who is planning on doing an MRI to see if there is any reason to go into her back for potential surgery. No falls since last time she came to PT. She did wake up with some increased pain in her hip but this has since resolved; she does want to make today her last day.    Pertinent History hx of colon cancer, hx of C5-7 herniated disc and surgeries    How long can you sit comfortably? 6/7- unlimited    How long can you stand comfortably? 6/7- varies but last night she was able to stand to cook supper, maybe 30 minutes    How long can you walk comfortably? 6/7- 20-30 minutes    Patient Stated Goals be pain free, be able to be more active    Currently in Pain? No/denies            [facility] PT Assessment - 12/15/15 0001    Observation/Other Assessments   Focus on Therapeutic Outcomes (FOTO)  35% limited    AROM   Overall  AROM Comments good improvement overall, however L ER and ADD musculature remain mildly stuff    Lumbar Flexion 70   Lumbar Extension 20   Lumbar - Right Side Bend fingers just past midline of knee ioint    Lumbar - Left Side Bend fingers just past midline of knee joint    Strength   Right Hip Flexion 5/5   Right Hip Extension 4/5   Right Hip ABduction 4+/5   Left Hip Flexion 5/5   Left Hip Extension 4-/5   Left Hip ABduction 4+/5   Right Knee Flexion 5/5   Right Knee Extension 5/5   Left Knee Flexion 5/5   Left Knee Extension 5/5   Right Ankle Dorsiflexion 5/5   Left Ankle Dorsiflexion 5/5   6 minute walk test results    Aerobic Endurance Distance Walked 1469   Endurance additional comments [id number]                              PT Education - 12/15/15 1239     Education provided Yes   Education Details DC today, importance of regular exercise and activity; gave [employer] and collected information for walking group    Person(s) Educated Patient   Methods Explanation   Comprehension Verbalized understanding          PT Short Term Goals - 12/15/15 1053    PT SHORT TERM GOAL #1   Title Patient to demosntrate full ROM of bilateral hips and lumbar spine in order to reduce pain and symptoms, improve tolerance to activitiy    Baseline 6/7- mild stiffness on L hip, mild lumbar stiff    Time 4   Period Weeks   Status Partially Met   PT SHORT TERM GOAL #2   Title Patient to report she has been able to ambulate and statically stand for at least 30 minutes with no exacerbation of her pain and symptoms in order to demonstrate improved functional task performance skills    Baseline 6/7- reports ablity to successfully complete this recently    Time 4   Period Weeks   Status Achieved   PT SHORT TERM GOAL #3   Title Patient to demonstrate proper lifting strategies with load up to 20# in order to prevent exacerbation of symptoms and to reduce chances of recurrence of injury    Baseline 6/7- continues to demonstrate poor lifting mechanics, but reports at home she has someone else lift things for her    Time 4   Period Weeks   Status Not Met   PT SHORT TERM GOAL #4   Title Patient to be independent in correctly and consistently performing appropriate HEP, to be updated PRN    Baseline 6/7- good adherence    Time 4   Period Weeks   Status Achieved           PT Long Term Goals - 12/15/15 1054    PT LONG TERM GOAL #1   Title Patient to demonstrate no LLD within the past 4 weeks and will be independent in self-correction strategies in order to enhance independence in reducing pain adn managing symtpoms    Baseline 6/7- no LLD recently have not instructed in correction due to pain 0/10   Time 8   Period Weeks   Status Partially Met   PT LONG  TERM GOAL #2   Title Patient to demonstrate strength 5/5 in all tested muscle groups in order  to reduce pain and improve regional stability    Baseline January 11, 2023- improved    Time 8   Period Weeks   Status Partially Met   PT LONG TERM GOAL #3   Title Patient to be able to ambulate and statically stand for at least 60 minutes with no exacerbation of pain or symptoms in order to enhance task performance in community and at home    Baseline 01-11-23- still challenging    Time 8   Period Weeks   Status On-going   PT LONG TERM GOAL #4   Title Patient to report she has been able to perform at leaset 20 minutse of light intensity physical activity with no exacerbation of pain or symptoms in order to assist in improving overall health and QOL    Baseline 01-11-2023- not doing regular exercise program    Time 8   Period Weeks   Status On-going               Plan - January 11, 2016 1240    Clinical Impression Statement Discharge assessment performed today, as patient is requesting DC at this time. Patient has made good progress with skilled PT services, demonstrating improvement in gait tolerance and mechanics, functional strength, muscle flexibility, and functioanl task tolernace/performance skills. She is pleased with her current level of  function and attributes her remaining difficulties to her knees. Educated regarding importance of regular exercise/activity and gave [employer] waiver/collected information for local walking program. DC today per patient request.    Rehab Potential Good   PT Next Visit Plan DC today    Consulted and Agree with Plan of Care Patient      Patient will benefit from skilled therapeutic intervention in order to improve the following deficits and impairments:  Abnormal gait, Hypomobility, Decreased strength, Pain, Difficulty walking, Decreased activity tolerance, Increased muscle spasms, Improper body mechanics, Decreased coordination, Impaired flexibility, Postural dysfunction  Visit  Diagnosis: Stiffness of left hip, not elsewhere classified  Difficulty in walking, not elsewhere classified  Muscle weakness (generalized)  Abnormal posture  Left-sided low back pain with left-sided sciatica       G-Codes - 01-11-16 1243    Functional Assessment Tool Used FOTO 35% limited    Functional Limitation Mobility: Walking and moving around   Mobility: Walking and Moving Around Goal Status [phone]) At least 20 percent but less than 40 percent impaired, limited or restricted   Mobility: Walking and Moving Around Discharge Status [phone]) At least 20 percent but less than 40 percent impaired, limited or restricted      Problem List Patient Active Problem List   Diagnosis Date Noted  . Iron deficiency 02/18/2014  . Adenocarcinoma of transverse colon ([place]) 12/29/2013  . Mass of pancreas 12/08/2013   PHYSICAL THERAPY DISCHARGE SUMMARY  Visits from Start of Care: 13  Current functional level related to goals / functional outcomes: Patient requesting DC today due to being pleased with functional status; she has done relatively well with skilled PT services overall.    Remaining deficits: Impaired posture, muscle flexibility, strength, functional lifting mechanics    Education / Equipment: [employer] waiver, local walking program, importance of regular  activity moving forwards   Plan: Patient agrees to discharge.  Patient goals were partially met. Patient is being discharged due to being pleased with the current functional level.  ?????       Deniece Ree PT, DPT [facility] [address] [place], [place], [address]  Phone: [phone]   Fax:  [phone]  Name: Amanda Norris MRN: [id number] Date of Birth: 05/04/1946

## 2015-12-20 ENCOUNTER — Encounter (HOSPITAL_COMMUNITY): Payer: Medicare Other | Admitting: Physical Therapy

## 2015-12-22 ENCOUNTER — Encounter (HOSPITAL_COMMUNITY): Payer: Medicare Other | Admitting: Physical Therapy

## 2015-12-22 ENCOUNTER — Ambulatory Visit (HOSPITAL_COMMUNITY)
Admission: RE | Admit: 2015-12-22 | Discharge: 2015-12-22 | Disposition: A | Discharge status: Home / Self Care | Payer: Medicare Other | Source: Ambulatory Visit | Attending: Orthopedic Surgery | Admitting: Orthopedic Surgery

## 2015-12-22 DIAGNOSIS — M8938 Hypertrophy of bone, other site: Secondary | ICD-10-CM | POA: Diagnosis not present

## 2015-12-22 DIAGNOSIS — R600 Localized edema: Secondary | ICD-10-CM | POA: Diagnosis not present

## 2015-12-22 DIAGNOSIS — M5126 Other intervertebral disc displacement, lumbar region: Secondary | ICD-10-CM

## 2015-12-22 DIAGNOSIS — M5136 Other intervertebral disc degeneration, lumbar region: Secondary | ICD-10-CM | POA: Insufficient documentation

## 2015-12-22 DIAGNOSIS — M4806 Spinal stenosis, lumbar region: Secondary | ICD-10-CM | POA: Diagnosis not present

## 2015-12-22 DIAGNOSIS — M1288 Other specific arthropathies, not elsewhere classified, other specified site: Secondary | ICD-10-CM | POA: Insufficient documentation

## 2015-12-27 ENCOUNTER — Ambulatory Visit (INDEPENDENT_AMBULATORY_CARE_PROVIDER_SITE_OTHER): Payer: Medicare Other | Admitting: Orthopedic Surgery

## 2015-12-27 VITALS — BP 141/68 | HR 81 | Ht 63.0 in | Wt 174.0 lb

## 2015-12-27 DIAGNOSIS — M5126 Other intervertebral disc displacement, lumbar region: Secondary | ICD-10-CM

## 2015-12-27 NOTE — Progress Notes (Signed)
Patient ID: Amanda Norris, female   DOB: 1945-09-14, 70 y.o.   MRN: [id number]  MRI FOLLOW UP  Chief Complaint  Patient presents with  . Results    MRI L SPINE    HPI MAKHALA Norris is a 70 y.o. female.    MRI OF THE Lumbar spine for back and leg pain prior treatment with gabapentin 3 mg up to 3 times a day seems to be improving her symptoms. She still has difficulty walking for long periods of time and often has to sit down however, she's had adjustments in her activities which seem to have given her symptom relief as well  ROS  She denies any bowel or bladder dysfunction or perineum numbness  Physical Exam  Data  Independent image interpretation the MRI showed I have reviewed the MRI and I see that she has an L4-5 disc herniation the report also was reviewed and was read  The report was read as follows   EXAM: MRI LUMBAR SPINE WITHOUT CONTRAST   TECHNIQUE: Multiplanar, multisequence MR imaging of the lumbar spine was performed. No intravenous contrast was administered.   COMPARISON:  Radiography 10/14/2015   FINDINGS: Segmentation:  5 lumbar type vertebral bodies.   Alignment:  Curvature convex to the right with the apex at L3.   Vertebrae:  No fracture or focal osseous lesion.   Conus medullaris: Extends to the T12-L1 level and appears normal.   Paraspinal and other soft tissues: No significant finding.   Disc levels:   L1-2:  Mild bulging of the disc.  No stenosis.   L2-3: Mild bulging of the disc. Mild facet hypertrophy. No stenosis.   L3-4: Shallow protrusion of the disc more prominent towards the left. Slight indentation of the thecal sac. Mild narrowing of the left lateral recess and intervertebral foramen on the left, but without likely neural compression.   L4-5: Disc degeneration with shallow broad-based herniation of disc material. Bilateral facet arthropathy with hypertrophic change, joint fluid and marrow edema. Stenosis of both lateral recesses  that could cause neural compression on either or both sides. The facet arthropathy because of pain as well.   L5-S1:  Normal interspace.   IMPRESSION: The dominant findings are at L4-5. The disc is degenerated and there is a broad-based disc herniation. There is bilateral facet arthropathy with hypertrophy and edema. There is stenosis of the lateral recesses that could cause neural compression on either or both sides. The facet arthropathy could be a cause of low back pain or referred facet syndrome pain.   Shallow disc protrusion at L3-4 more prominent towards the left. Mild narrowing of the left lateral recess and intervertebral foramen on the left but without definite compressive stenosis.     Electronically Signed   By: Nelson Chimes M.D.   On: 12/22/2015 08:46  After much discussion with her we decided to go ahead and continue the gabapentin, I gave her warning signs of bowel bladder dysfunction or numbness in the perineum she will call us sooner but otherwise she will see me in 3 months  We offered her epidural steroid injections as well.   Arther Abbott, MD 12/27/2015 2:07 PM

## 2015-12-27 NOTE — Patient Instructions (Signed)
Epidural Steroid Injection  An epidural steroid injection is given to relieve pain in your neck, back, or legs that is caused by the irritation or swelling of a nerve root. This procedure involves injecting a steroid and numbing medicine (anesthetic) into the epidural space. The epidural space is the space between the outer covering of your spinal cord and the bones that form your backbone (vertebra).   LET YOUR HEALTH CARE PROVIDER KNOW ABOUT:   · Any allergies you have.  · All medicines you are taking, including vitamins, herbs, eye drops, creams, and over-the-counter medicines such as aspirin.  · Previous problems you or members of your family have had with the use of anesthetics.  · Any blood disorders or blood clotting disorders you have.  · Previous surgeries you have had.  · Medical conditions you have.  RISKS AND COMPLICATIONS  Generally, this is a safe procedure. However, as with any procedure, complications can occur. Possible complications of epidural steroid injection include:  · Headache.  · Bleeding.  · Infection.  · Allergic reaction to the medicines.  · Damage to your nerves.  The response to this procedure depends on the underlying cause of the pain and its duration. People who have long-term (chronic) pain are less likely to benefit from epidural steroids than are those people whose pain comes on strong and suddenly.  BEFORE THE PROCEDURE   · Ask your health care provider about changing or stopping your regular medicines. You may be advised to stop taking blood-thinning medicines a few days before the procedure.  · You may be given medicines to reduce anxiety.  · Arrange for someone to take you home after the procedure.  PROCEDURE   · You will remain awake during the procedure. You may receive medicine to make you relaxed.  · You will be asked to lie on your stomach.  · The injection site will be cleaned.  · The injection site will be numbed with a medicine (local anesthetic).  · A needle will be  injected through your skin into the epidural space.  · Your health care provider will use an X-ray machine to ensure that the steroid is delivered closest to the affected nerve. You may have minimal discomfort at this time.  · Once the needle is in the right position, the local anesthetic and the steroid will be injected into the epidural space.  · The needle will then be removed and a bandage will be applied to the injection site.  AFTER THE PROCEDURE   · You may be monitored for a short time before you go home.  · You may feel weakness or numbness in your arm or leg, which disappears within hours.  · You may be allowed to eat, drink, and take your regular medicine.  · You may have soreness at the site of the injection.     This information is not intended to replace advice given to you by your health care provider. Make sure you discuss any questions you have with your health care provider.     Document Released: 10/03/2007 Document Revised: 02/26/2013 Document Reviewed: 12/13/2012  [employer]vier Interactive Patient Education ©2016 [employer]

## 2016-01-06 ENCOUNTER — Encounter (HOSPITAL_COMMUNITY): Payer: Medicare Other

## 2016-01-06 ENCOUNTER — Encounter (HOSPITAL_COMMUNITY): Payer: Self-pay | Admitting: Hematology & Oncology

## 2016-01-06 ENCOUNTER — Encounter (HOSPITAL_COMMUNITY): Payer: Medicare Other | Attending: Hematology & Oncology | Admitting: Hematology & Oncology

## 2016-01-06 VITALS — BP 137/64 | HR 65 | Temp 97.4°F | Wt 173.9 lb

## 2016-01-06 DIAGNOSIS — C786 Secondary malignant neoplasm of retroperitoneum and peritoneum: Secondary | ICD-10-CM | POA: Insufficient documentation

## 2016-01-06 DIAGNOSIS — R918 Other nonspecific abnormal finding of lung field: Secondary | ICD-10-CM | POA: Diagnosis not present

## 2016-01-06 DIAGNOSIS — Z9889 Other specified postprocedural states: Secondary | ICD-10-CM | POA: Insufficient documentation

## 2016-01-06 DIAGNOSIS — C184 Malignant neoplasm of transverse colon: Secondary | ICD-10-CM | POA: Insufficient documentation

## 2016-01-06 DIAGNOSIS — Z85038 Personal history of other malignant neoplasm of large intestine: Secondary | ICD-10-CM | POA: Diagnosis not present

## 2016-01-06 DIAGNOSIS — R9389 Abnormal findings on diagnostic imaging of other specified body structures: Secondary | ICD-10-CM

## 2016-01-06 DIAGNOSIS — M5442 Lumbago with sciatica, left side: Secondary | ICD-10-CM

## 2016-01-06 DIAGNOSIS — C189 Malignant neoplasm of colon, unspecified: Secondary | ICD-10-CM

## 2016-01-06 LAB — CBC WITH DIFFERENTIAL/PLATELET
BASOS ABS: 0 10*3/uL (ref 0.0–0.1)
Basophils Relative: 0 %
Eosinophils Absolute: 0.2 10*3/uL (ref 0.0–0.7)
Eosinophils Relative: 2 %
HEMATOCRIT: 41 % (ref 36.0–46.0)
Hemoglobin: 13.5 g/dL (ref 12.0–15.0)
LYMPHS PCT: 28 %
Lymphs Abs: 2 10*3/uL (ref 0.7–4.0)
MCH: 30 pg (ref 26.0–34.0)
MCHC: 32.9 g/dL (ref 30.0–36.0)
MCV: 91.1 fL (ref 78.0–100.0)
Monocytes Absolute: 0.8 10*3/uL (ref 0.1–1.0)
Monocytes Relative: 11 %
NEUTROS ABS: 4.3 10*3/uL (ref 1.7–7.7)
NEUTROS PCT: 59 %
Platelets: 190 10*3/uL (ref 150–400)
RBC: 4.5 MIL/uL (ref 3.87–5.11)
RDW: 12.9 % (ref 11.5–15.5)
WBC: 7.3 10*3/uL (ref 4.0–10.5)

## 2016-01-06 LAB — COMPREHENSIVE METABOLIC PANEL
ALT: 17 U/L (ref 14–54)
AST: 22 U/L (ref 15–41)
Albumin: 4.2 g/dL (ref 3.5–5.0)
Alkaline Phosphatase: 63 U/L (ref 38–126)
Anion gap: 6 (ref 5–15)
BILIRUBIN TOTAL: 0.2 mg/dL — AB (ref 0.3–1.2)
BUN: 15 mg/dL (ref 6–20)
CHLORIDE: 104 mmol/L (ref 101–111)
CO2: 27 mmol/L (ref 22–32)
CREATININE: 0.66 mg/dL (ref 0.44–1.00)
Calcium: 9.2 mg/dL (ref 8.9–10.3)
GFR calc Af Amer: >60 mL/min (ref >60)
GLUCOSE: 112 mg/dL — AB (ref 65–99)
Potassium: 3.6 mmol/L (ref 3.5–5.1)
Sodium: 137 mmol/L (ref 135–145)
TOTAL PROTEIN: 7.4 g/dL (ref 6.5–8.1)

## 2016-01-06 NOTE — Progress Notes (Signed)
Amanda Norris., MD [place] [id number]  Stage III CRC, Resection on 12/29/2013 Adjuvant FOLFOX  X 3 cycles only    Adenocarcinoma of transverse colon ([place])   12/09/2013 Initial Diagnosis 1. Colon, biopsy, proximal transverse mass INVASIVE ADENOCARCINOMA.   12/29/2013 Definitive Surgery Colon, segmental resection for tumor, right - INVASIVE COLORECTAL ADENOCARCINOMA, 7 CM, EXTENDING INTO PERICOLONIC CONNECTIVE TISSUE. - METASTATIC CARCINOMA IN 6 OF 33 LYMPH NODES (6/33).   02/10/2014 - 03/10/2014 Adjuvant Chemotherapy FOLFOX x 3 cycles   03/11/2014 Adverse Reaction Patient called reporting diarrhea despite dose reduction and she wants to stop therapy.  Patient seen on 03/24/14 to discuss other treatment options and she stands by her decision to stop all therapy.   04/01/2014 Surgery Port-a-cath removal by Dr. Arnoldo Morale.     CURRENT THERAPY: Surveillance per NCCN guidelines after completing only 3 cycles of FOLFOX chemotherapy due to intolerance.  INTERVAL HISTORY: Amanda Norris 70 y.o. 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 Patient received 3/12 cycles of adjuvant FOLFOX chemotherapy to reduce the risk of recurrence, but she then refused further therapy due to patient reported intolerance.  [name] returns to the [facility] today accompanied by her husband.  She found out that all four of her disks were "bulging or ruptured or herniated or whatever." She was told that if one of them bulged far enough to touch her spine, it could cause severe incontinence. She notes that she's prayed about it a lot and has other people praying about it, and that if the pain gets too bad she will probably ask more about the steroid injections.  When asked about the pain in her legs, she was told about scoliosis, spinal stenosis, all kinds of things. She notes "getting  old sucks!" She was sent to "the therapy place" and did all kinds of things, including various exercises and learning how to lift items from the floor. She noted that the therapy wasn't helping her hip pain at all, so after six weeks, she stopped going. At that time she went back to Dr. Aline Brochure and was sent to get the MRI. She is supposed to go back and see him in either August or September.  She denies any belly pain, change in appetite; she confirms that her weight is stable. She says there's a lot of pain in her back, "getting to be more and more pain in there." She remarks that while there's a lot of pain in her hips, its also very evident in her lower back. "I'm constantly moving trying to relieve some of that."  Other than all that back pain, she confirms that she hasn't noticed any change in her appetite, noting "if anything, I eat too much." She says she sleeps about eight hours every night, just waking up once or twice to roll over and go back to sleep.   She notes that her bowels are okay, adding "I still take an anti-diarrheal every morning, just to be safe."    Past Medical History  Diagnosis Date  . PONV (postoperative nausea and vomiting)   . Colon cancer ([place])   . Arthritis     wrist and thumbs  . Sciatica of left side   . Ruptured lumbar disc     L1-L5 per patient report    has Mass of pancreas; Adenocarcinoma of transverse colon ([place]); and Iron deficiency on  her problem list.     is allergic to sulfa antibiotics.  Ms. Butkowski does not currently have medications on file.  Past Surgical History  Procedure Laterality Date  . Herniated disc      [id number]  . Colonoscopy N/A 12/12/2013    Procedure: COLONOSCOPY;  Surgeon: Rogene Houston, MD;  Location[facility];  Service: Endoscopy;  Laterality: N/A;  730  . Eus N/A 12/18/2013    Procedure: UPPER ENDOSCOPIC ULTRASOUND (EUS) LINEAR;  Surgeon: Milus Banister, MD;  Location: [facility] END[facility];  Service: Endoscopy;  Laterality:  N/A;  . Partial colectomy N/A 12/29/2013    Procedure: RIGHT HEMICOLECTOMY;  Surgeon: Jamesetta So, MD;  Location: [facility] ORS;  Service: General;  Laterality: N/A;  . Portacath placement Right 02/04/2014    Procedure: INSERTION PORT-A-CATH;  Surgeon: Jamesetta So, MD;  Location: [facility] ORS;  Service: General;  Laterality: Right;  . Port-a-cath removal Right 04/01/2014    Procedure: MINOR REMOVAL PORT-A-CATH;  Surgeon: Jamesetta So, MD;  Location: [facility] ORS;  Service: General;  Laterality: Right;  . Colonoscopy N/A 01/20/2015    Procedure: COLONOSCOPY;  Surgeon: Rogene Houston, MD;  Location[facility];  Service: Endoscopy;  Laterality: N/A;  1030   Positive for left leg pain and foot pain. Denies any headaches, dizziness, double vision, fevers, chills, night sweats, nausea, vomiting, diarrhea, constipation, chest pain, heart palpitations, shortness of breath, blood in stool, black tarry stool, urinary pain, urinary burning, urinary frequency, hematuria. 14 point review of systems was performed and is negative except as detailed under history of present illness and above    PHYSICAL EXAMINATION  ECOG PERFORMANCE STATUS: 0 - Asymptomatic  Filed Vitals:   01/06/16 1142  BP: 137/64  Pulse: 65  Temp: 97.4 F (36.3 C)    GENERAL:alert, no distress, well nourished, well developed, comfortable, cooperative and smiling, accompanied by her husband SKIN: skin color, texture, turgor are normal, no rashes or significant lesions HEAD: Normocephalic, No masses, lesions, tenderness or abnormalities EYES: normal, PERRLA, EOMI, Conjunctiva are pink and non-injected EARS: External ears normal OROPHARYNX:lips, buccal mucosa, and tongue normal and mucous membranes are moist  NECK: supple, no adenopathy, thyroid normal size, non-tender, without nodularity, no stridor, non-tender, trachea midline LYMPH:  no palpable lymphadenopathy BREAST:not examined LUNGS: clear to auscultation and percussion HEART:  S1/S2 audible, occasional pause apprecitated ABDOMEN:abdomen soft, non-tender, normal bowel sounds, no masses or organomegaly and no hepatosplenomegaly. No rebound or guarding.  BACK: Back symmetric, no curvature., No CVA tenderness EXTREMITIES:less then 2 second capillary refill, no joint deformities, effusion, or inflammation, no edema, no skin discoloration, no clubbing, no cyanosis  NEURO: alert & oriented x 3 with fluent speech, no focal motor/sensory deficits, gait normal   LABORATORY DATA: I have reviewed the data as listed.  CBC    Component Value Date/Time   WBC 7.3 01/06/2016 1038   RBC 4.50 01/06/2016 1038   HGB 13.5 01/06/2016 1038   HCT 41.0 01/06/2016 1038   PLT 190 01/06/2016 1038   MCV 91.1 01/06/2016 1038   MCH 30.0 01/06/2016 1038   MCHC 32.9 01/06/2016 1038   RDW 12.9 01/06/2016 1038   LYMPHSABS 2.0 01/06/2016 1038   MONOABS 0.8 01/06/2016 1038   EOSABS 0.2 01/06/2016 1038   BASOSABS 0.0 01/06/2016 1038   CMP     Component Value Date/Time   NA 138 06/29/2015 1259   K 3.9 06/29/2015 1259   CL 102 06/29/2015 1259   CO2 29 06/29/2015  1259   GLUCOSE 131* 06/29/2015 1259   BUN 15 06/29/2015 1259   CREATININE 0.76 06/29/2015 1259   CALCIUM 9.7 06/29/2015 1259   PROT 7.7 06/29/2015 1259   ALBUMIN 4.2 06/29/2015 1259   AST 22 06/29/2015 1259   ALT 19 06/29/2015 1259   ALKPHOS 68 06/29/2015 1259   BILITOT 0.4 06/29/2015 1259   GFRNONAA >60 06/29/2015 1259   GFRAA >60 06/29/2015 1259   Results for EVILYN, KAZARIAN (MRN [id number])   Ref. Range 09/22/2014 10:18 12/24/2014 11:07 03/30/2015 12:21 06/29/2015 12:59 01/06/2016 10:38  CEA Latest Ref Range: 0.0-4.7 ng/mL 3.8 4.1 3.6 4.2 3.9      RADIOLOGY: I have personally reviewed the radiological images as listed and agreed with the findings in the report.  Study Result     CLINICAL DATA: Low back pain radiating to the left leg come 6 months duration  EXAM: MRI LUMBAR SPINE WITHOUT  CONTRAST  TECHNIQUE: Multiplanar, multisequence MR imaging of the lumbar spine was performed. No intravenous contrast was administered.  COMPARISON: Radiography 10/14/2015  FINDINGS: Segmentation: 5 lumbar type vertebral bodies.  Alignment: Curvature convex to the right with the apex at L3.  Vertebrae: No fracture or focal osseous lesion.  Conus medullaris: Extends to the T12-L1 level and appears normal.  Paraspinal and other soft tissues: No significant finding.  Disc levels:  L1-2: Mild bulging of the disc. No stenosis.  L2-3: Mild bulging of the disc. Mild facet hypertrophy. No stenosis.  L3-4: Shallow protrusion of the disc more prominent towards the left. Slight indentation of the thecal sac. Mild narrowing of the left lateral recess and intervertebral foramen on the left, but without likely neural compression.  L4-5: Disc degeneration with shallow broad-based herniation of disc material. Bilateral facet arthropathy with hypertrophic change, joint fluid and marrow edema. Stenosis of both lateral recesses that could cause neural compression on either or both sides. The facet arthropathy because of pain as well.  L5-S1: Normal interspace.  IMPRESSION: The dominant findings are at L4-5. The disc is degenerated and there is a broad-based disc herniation. There is bilateral facet arthropathy with hypertrophy and edema. There is stenosis of the lateral recesses that could cause neural compression on either or both sides. The facet arthropathy could be a cause of low back pain or referred facet syndrome pain.  Shallow disc protrusion at L3-4 more prominent towards the left. Mild narrowing of the left lateral recess and intervertebral foramen on the left but without definite compressive stenosis.   Electronically Signed  By: Nelson Chimes M.D.  On: 12/22/2015 08:46     Study Result     CLINICAL DATA: Fall 6 weeks ago with persistent  left hip pain, initial encounter  EXAM: DG HIP (WITH OR WITHOUT PELVIS) 2-3V LEFT  COMPARISON: None.  FINDINGS: No acute fracture or dislocation is noted. Pelvic ring is intact. An exostosis is noted arising from the lateral aspect of the left iliac wing.  IMPRESSION: No acute abnormality noted.   Electronically Signed  By: Inez Catalina M.D.  On: 08/24/2015 08:54     ASSESSMENT AND PLAN:  History of STAGE III CRC Surgical Resection Adjuvant FOLFOX X 3 cycles only Abnormal follow-up CT of the abdomen Abnormal PET imaging of the abdomen/lung Left leg/low back pain/sciatica   Clinically she is having low back/leg pain which she describes as sciatic type pain. Last PET in December overall showed stability in abnormal findings and resolution of adenopathy at the L thoracic inlet. She does not want  to pursue another PET at this point. She has refused biopsy to date. She is following with orthopedics. She is agreeable to CT imaging given the progressive nature of her pain. Weight is stable.   Will call her with results of her imaging. If progression noted she will return for additional discussion. Otherwise RTC in 3 months with ongoing surveillance.   Orders Placed This Encounter  Procedures  . CT Abdomen Pelvis W Contrast    Standing Status: Future     Number of Occurrences:      Standing Expiration Date: 01/05/2017    Order Specific Question:  If indicated for the ordered procedure, I authorize the administration of contrast media per Radiology protocol    Answer:  Yes    Order Specific Question:  Reason for Exam (SYMPTOM  OR DIAGNOSIS REQUIRED)    Answer:  restaging CRC, increasing abdominal and back pain, abnomal imaging    Order Specific Question:  Preferred imaging location?    Answer:  [facility]  . CT Chest W Contrast    Standing Status: Future     Number of Occurrences:      Standing Expiration Date: 01/05/2017    Order Specific Question:  If  indicated for the ordered procedure, I authorize the administration of contrast media per Radiology protocol    Answer:  Yes    Order Specific Question:  Reason for Exam (SYMPTOM  OR DIAGNOSIS REQUIRED)    Answer:  restaging CRC, abnormal imaging, increasing abdominal and back pain    Order Specific Question:  Preferred imaging location?    Answer:  [facility]   All questions were answered. The patient knows to call the clinic with any problems, questions or concerns. We can certainly see the patient much sooner if necessary.    This document serves as a record of services personally performed by Ancil Linsey, MD. It was created on her behalf by Toni Amend, a trained medical scribe. The creation of this record is based on the scribe's personal observations and the provider's statements to them. This document has been checked and approved by the attending provider.  I have reviewed the above documentation for accuracy and completeness, and I agree with the above.  This note is electronically signed.  Kelby Fam. Whitney Muse, MD

## 2016-01-06 NOTE — Patient Instructions (Addendum)
[place] at [facility] Discharge Instructions  RECOMMENDATIONS MADE BY THE CONSULTANT AND ANY TEST RESULTS WILL BE SENT TO YOUR REFERRING PHYSICIAN.  You were seen by Dr. Whitney Muse today. CT scans of your chest and abdomen have been ordered. We will schedule and the office will call you with the results.  Return to the clinic in 3 months for follow-up and lab work. Call clinic with any questions or concerns.   Thank you for choosing [place] at [facility] to provide your oncology and hematology care.  To afford each patient quality time with our provider, please arrive at least 15 minutes before your scheduled appointment time.   Beginning January 23rd 2017 lab work for the [facility] will be done in the  Main lab at [facility] on 1st floor. If you have a lab appointment with the [place] please come in thru the  Main Entrance and check in at the main information desk  You need to re-schedule your appointment should you arrive 10 or more minutes late.  We strive to give you quality time with our providers, and arriving late affects you and other patients whose appointments are after yours.  Also, if you no show three or more times for appointments you may be dismissed from the clinic at the providers discretion.     Again, thank you for choosing [facility].  Our hope is that these requests will decrease the amount of time that you wait before being seen by our physicians.       _____________________________________________________________  Should you have questions after your visit to [facility], please contact our office at [phone] between the hours of 8:30 a.m. and 4:30 p.m.  Voicemails left after 4:30 p.m. will not be returned until the following business day.  For prescription refill requests, have your pharmacy contact our office.         Resources For Cancer Patients and their  Caregivers ? American Cancer Society: Can assist with transportation, wigs, general needs, runs Look Good Feel Better.        (843)637-3918 ? Cancer Care: Provides financial assistance, online support groups, medication/co-pay assistance.  1-800-813-HOPE 618-089-9734) ? [place] Assists [place] cancer patients and their families through emotional , educational and financial support.  (438)252-3982 ? [place] DSS Where to apply for food stamps, Medicaid and utility assistance. (817)407-3819 ? RCATS: Transportation to medical appointments. 515 157 9465 ? Social Security Administration: May apply for disability if have a Stage IV cancer. 315-811-7010 9342132598 ? [employer], Disability and Transit Services: Assists with nutrition, care and transit needs. [place] Support Programs: @10RELATIVEDAYS @ > Cancer Support Group  2nd Tuesday of the month 1pm-2pm, Journey Room  > Creative Journey  3rd Tuesday of the month 1130am-1pm, Journey Room  > Look Good Feel Better  1st Wednesday of the month 10am-12 noon, Journey Room (Call [place] to register [phone])

## 2016-01-07 LAB — CEA: CEA: 3.9 ng/mL (ref 0.0–4.7)

## 2016-01-13 ENCOUNTER — Ambulatory Visit (HOSPITAL_COMMUNITY)
Admission: RE | Admit: 2016-01-13 | Discharge: 2016-01-13 | Disposition: A | Discharge status: Home / Self Care | Payer: Medicare Other | Source: Ambulatory Visit | Attending: Hematology & Oncology | Admitting: Hematology & Oncology

## 2016-01-13 DIAGNOSIS — C7989 Secondary malignant neoplasm of other specified sites: Secondary | ICD-10-CM | POA: Diagnosis not present

## 2016-01-13 DIAGNOSIS — C786 Secondary malignant neoplasm of retroperitoneum and peritoneum: Secondary | ICD-10-CM | POA: Diagnosis not present

## 2016-01-13 DIAGNOSIS — Z9049 Acquired absence of other specified parts of digestive tract: Secondary | ICD-10-CM | POA: Insufficient documentation

## 2016-01-13 DIAGNOSIS — C19 Malignant neoplasm of rectosigmoid junction: Secondary | ICD-10-CM | POA: Diagnosis not present

## 2016-01-13 DIAGNOSIS — R918 Other nonspecific abnormal finding of lung field: Secondary | ICD-10-CM | POA: Insufficient documentation

## 2016-01-13 DIAGNOSIS — C184 Malignant neoplasm of transverse colon: Secondary | ICD-10-CM | POA: Diagnosis not present

## 2016-01-13 MED ORDER — IOPAMIDOL (ISOVUE-300) INJECTION 61%
100.0000 mL | Freq: Once | INTRAVENOUS | Status: AC | PRN
  Administered 2016-01-13: 100 mL via INTRAVENOUS

## 2016-01-23 ENCOUNTER — Encounter (HOSPITAL_COMMUNITY): Payer: Self-pay | Admitting: Hematology & Oncology

## 2016-03-27 ENCOUNTER — Encounter: Payer: Self-pay | Admitting: Orthopedic Surgery

## 2016-03-27 ENCOUNTER — Ambulatory Visit (INDEPENDENT_AMBULATORY_CARE_PROVIDER_SITE_OTHER): Payer: Medicare Other | Admitting: Orthopedic Surgery

## 2016-03-27 VITALS — BP 137/76 | HR 80 | Ht 64.0 in | Wt 174.0 lb

## 2016-03-27 DIAGNOSIS — M5442 Lumbago with sciatica, left side: Secondary | ICD-10-CM | POA: Diagnosis not present

## 2016-03-27 NOTE — Patient Instructions (Signed)
PT [place]

## 2016-03-27 NOTE — Progress Notes (Signed)
Patient ID: Amanda Norris, female   DOB: 01-Jul-1946, 70 y.o.   MRN: [id number]  Chief Complaint  Patient presents with  . Follow-up    back pain    HPI Amanda Norris is a 70 y.o. female.  Follow-up regarding back pain and left hip pain HPI  Last visit she had an MRI of her lumbar spine and she had some degenerative disc disease at L4-5 disc herniation which she elected to treat nonoperatively with gabapentin and she is doing well with that. She's adjusted her lifestyle and activities to control her symptoms she seems to have the most discomfort when standing one place or sitting in the car for long periods of time   Review of Systems Review of Systems  Bowel and bladder function remain intact  Physical Exam BP 137/76   Pulse 80   Ht 5\' 4"  (1.626 m)   Wt 174 lb (78.9 kg)   BMI 29.87 kg/m    Encounter Diagnosis  Name Primary?  . Left-sided low back pain with left-sided sciatica Yes    We discussed ongoing treatment which will include continuing gabapentin no prescriptions needed today. She will start physical therapy for 1-2 visits for home exercises.

## 2016-04-07 ENCOUNTER — Ambulatory Visit (HOSPITAL_COMMUNITY): Payer: Medicare Other | Admitting: Hematology & Oncology

## 2016-04-07 ENCOUNTER — Encounter (HOSPITAL_COMMUNITY): Payer: Medicare Other | Attending: Hematology & Oncology

## 2016-04-07 DIAGNOSIS — C189 Malignant neoplasm of colon, unspecified: Secondary | ICD-10-CM | POA: Insufficient documentation

## 2016-04-07 LAB — CBC WITH DIFFERENTIAL/PLATELET
BASOS ABS: 0 10*3/uL (ref 0.0–0.1)
BASOS PCT: 0 %
EOS ABS: 0.3 10*3/uL (ref 0.0–0.7)
EOS PCT: 4 %
HCT: 42.1 % (ref 36.0–46.0)
Hemoglobin: 14 g/dL (ref 12.0–15.0)
LYMPHS PCT: 26 %
Lymphs Abs: 2.1 10*3/uL (ref 0.7–4.0)
MCH: 30 pg (ref 26.0–34.0)
MCHC: 33.3 g/dL (ref 30.0–36.0)
MCV: 90.1 fL (ref 78.0–100.0)
Monocytes Absolute: 0.7 10*3/uL (ref 0.1–1.0)
Monocytes Relative: 9 %
Neutro Abs: 4.7 10*3/uL (ref 1.7–7.7)
Neutrophils Relative %: 61 %
PLATELETS: 207 10*3/uL (ref 150–400)
RBC: 4.67 MIL/uL (ref 3.87–5.11)
RDW: 13 % (ref 11.5–15.5)
WBC: 7.8 10*3/uL (ref 4.0–10.5)

## 2016-04-07 LAB — COMPREHENSIVE METABOLIC PANEL
ALBUMIN: 4.3 g/dL (ref 3.5–5.0)
ALT: 22 U/L (ref 14–54)
AST: 29 U/L (ref 15–41)
Alkaline Phosphatase: 68 U/L (ref 38–126)
Anion gap: 7 (ref 5–15)
BUN: 15 mg/dL (ref 6–20)
CHLORIDE: 102 mmol/L (ref 101–111)
CO2: 29 mmol/L (ref 22–32)
Calcium: 9.7 mg/dL (ref 8.9–10.3)
Creatinine, Ser: 0.79 mg/dL (ref 0.44–1.00)
GFR calc Af Amer: >60 mL/min (ref >60)
GFR calc non Af Amer: >60 mL/min (ref >60)
Glucose, Bld: 139 mg/dL — ABNORMAL HIGH (ref 65–99)
POTASSIUM: 4.8 mmol/L (ref 3.5–5.1)
SODIUM: 138 mmol/L (ref 135–145)
TOTAL PROTEIN: 7.8 g/dL (ref 6.5–8.1)
Total Bilirubin: 0.3 mg/dL (ref 0.3–1.2)

## 2016-04-08 LAB — CEA: CEA: 3.7 ng/mL (ref 0.0–4.7)

## 2016-04-11 ENCOUNTER — Telehealth (HOSPITAL_COMMUNITY): Payer: Self-pay | Admitting: *Deleted

## 2016-04-11 NOTE — Telephone Encounter (Signed)
Aware of lab results  

## 2016-04-11 NOTE — Telephone Encounter (Signed)
-----   Message from Baird Cancer, PA-C sent at 04/09/2016 11:11 AM EDT ----- Stable.  CEA is WNL.

## 2016-04-20 ENCOUNTER — Encounter (HOSPITAL_COMMUNITY): Payer: Self-pay

## 2016-04-20 ENCOUNTER — Encounter (HOSPITAL_COMMUNITY): Payer: Medicare Other | Attending: Oncology | Admitting: Oncology

## 2016-04-20 VITALS — BP 131/73 | HR 73 | Temp 98.3°F | Resp 16 | Ht 64.0 in | Wt 170.5 lb

## 2016-04-20 DIAGNOSIS — C184 Malignant neoplasm of transverse colon: Secondary | ICD-10-CM

## 2016-04-20 DIAGNOSIS — M545 Low back pain: Secondary | ICD-10-CM

## 2016-04-20 DIAGNOSIS — R918 Other nonspecific abnormal finding of lung field: Secondary | ICD-10-CM

## 2016-04-20 DIAGNOSIS — M79605 Pain in left leg: Secondary | ICD-10-CM

## 2016-04-20 DIAGNOSIS — C189 Malignant neoplasm of colon, unspecified: Secondary | ICD-10-CM | POA: Insufficient documentation

## 2016-04-20 NOTE — Patient Instructions (Signed)
[place] at [facility] Discharge Instructions  RECOMMENDATIONS MADE BY THE CONSULTANT AND ANY TEST RESULTS WILL BE SENT TO YOUR REFERRING PHYSICIAN.   you were seen today by Dr. Oliva Bustard. Follow up in 6 months for labs and office visit.   Thank you for choosing [place] at [facility] to provide your oncology and hematology care.  To afford each patient quality time with our provider, please arrive at least 15 minutes before your scheduled appointment time.   Beginning January 23rd 2017 lab work for the [facility] will be done in the  Main lab at [facility] on 1st floor. If you have a lab appointment with the [place] please come in thru the  Main Entrance and check in at the main information desk  You need to re-schedule your appointment should you arrive 10 or more minutes late.  We strive to give you quality time with our providers, and arriving late affects you and other patients whose appointments are after yours.  Also, if you no show three or more times for appointments you may be dismissed from the clinic at the providers discretion.     Again, thank you for choosing [facility]  Our hope is that these requests will decrease the amount of time that you wait before being seen by our physicians.       _____________________________________________________________  Should you have questions after your visit to [facility], please contact our office at [phone] between the hours of 8:30 a.m. and 4:30 p.m.  Voicemails left after 4:30 p.m. will not be returned until the following business day.  For prescription refill requests, have your pharmacy contact our office.         Resources For Cancer Patients and their Caregivers ? American Cancer Society: Can assist with transportation, wigs, general needs, runs Look Good Feel Better.        514-087-6672 ? Cancer Care: Provides financial  assistance, online support groups, medication/co-pay assistance.  1-800-813-HOPE 4196388323) ? [place] Assists [place] cancer patients and their families through emotional , educational and financial support.  818-651-1365 ? [place] DSS Where to apply for food stamps, Medicaid and utility assistance. 626-697-9177 ? RCATS: Transportation to medical appointments. 760-172-1361 ? Social Security Administration: May apply for disability if have a Stage IV cancer. (484)785-4268 (818)197-4740 ? [employer], Disability and Transit Services: Assists with nutrition, care and transit needs. [place] Support Programs: @10RELATIVEDAYS @ > Cancer Support Group  2nd Tuesday of the month 1pm-2pm, Journey Room  > Creative Journey  3rd Tuesday of the month 1130am-1pm, Journey Room  > Look Good Feel Better  1st Wednesday of the month 10am-12 noon, Journey Room (Call [place] to register [phone])

## 2016-04-20 NOTE — Progress Notes (Signed)
Amanda Norris., MD [place] [id number]  Stage III CRC, Resection on 12/29/2013 Adjuvant FOLFOX  X 3 cycles only    Adenocarcinoma of transverse colon ([place])   12/09/2013 Initial Diagnosis    1. Colon, biopsy, proximal transverse mass INVASIVE ADENOCARCINOMA.      12/29/2013 Definitive Surgery    Colon, segmental resection for tumor, right - INVASIVE COLORECTAL ADENOCARCINOMA, 7 CM, EXTENDING INTO PERICOLONIC CONNECTIVE TISSUE. - METASTATIC CARCINOMA IN 6 OF 33 LYMPH NODES (6/33).      02/10/2014 - 03/10/2014 Adjuvant Chemotherapy    FOLFOX x 3 cycles      03/11/2014 Adverse Reaction    Patient called reporting diarrhea despite dose reduction and she wants to stop therapy.  Patient seen on 03/24/14 to discuss other treatment options and she stands by her decision to stop all therapy.      04/01/2014 Surgery    Port-a-cath removal by Dr. Arnoldo Morale.      01/12/2015 PET scan    Mild abdominal mesenteric LAD show hypermetabolic activity, 7 mm indeterminate pulm nodule in sup segment of RLL shows no assoc metabolic activity      [id number] PET scan    Mild progression of nodal mets in anterior mid abdominal mesentery, new nodal mets at L thoracic inlet. stable 6 mm nodule in posterior RLL, unchanged since 2015      06/22/2015 PET scan    Resolution of metabolic activity and decreased size of mild LAD at L thoracic inlet, stable mild mid abd hypermetabolic mesenteric LAD, stable 6 mm posterior LLL pulm nodule without metabolic activity      [id number] Imaging    MRI lumbar spine L4-L5 disc degenerated, broad based disc hernation, B facet arthropathy with hypertophy and edema, stenosis of lateral recesses that could cause neural compression        CURRENT THERAPY: Surveillance per NCCN guidelines after completing only 3 cycles of FOLFOX chemotherapy due to intolerance.  INTERVAL HISTORY: Amanda Norris 70 y.o. 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 Patient received 3/12 cycles of adjuvant FOLFOX chemotherapy to reduce the risk of recurrence, but she then refused further therapy due to patient reported intolerance.  Amanda Norris returns to the [facility] today accompanied by her husband.  She found out that all four of her disks were "bulging or ruptured or herniated or whatever." She was told that if one of them bulged far enough to touch her spine, it could cause severe incontinence. She notes that she's prayed about it a lot and has other people praying about it, and that if the pain gets too bad she will probably ask more about the steroid injections.  Patient is here for ongoing evaluation regarding carcinoma of colon.  Patient had an abbreviated course of chemotherapy. Patient is not getting any mammogram done last mammogram was 17 years ago. No rectal pain or bleeding.  No abdominal pain.  No nausea vomiting diarrhea.  Past Medical History:  Diagnosis Date  . Arthritis    wrist and thumbs  . Colon cancer ([place])   . PONV (postoperative nausea and vomiting)   . Ruptured lumbar disc    L1-L5 per patient report  . Sciatica of left side     has Mass of pancreas; Adenocarcinoma of transverse colon ([place]); and Iron deficiency on her problem list.     is allergic to sulfa  antibiotics.  Amanda Norris had no medications administered during this visit.  Past Surgical History:  Procedure Laterality Date  . COLONOSCOPY N/A 12/12/2013   Procedure: COLONOSCOPY;  Surgeon: Rogene Houston, MD;  [facility];  Service: Endoscopy;  Laterality: N/A;  730  . COLONOSCOPY N/A 01/20/2015   Procedure: COLONOSCOPY;  Surgeon: Rogene Houston, MD;  [facility];  Service: Endoscopy;  Laterality: N/A;  1030  . EUS N/A 12/18/2013   Procedure: UPPER ENDOSCOPIC ULTRASOUND (EUS) LINEAR;  Surgeon: Milus Banister, MD;  [facility]: [facility];  Service: Endoscopy;  Laterality: N/A;  . herniated disc     1996-c5-c7  . PARTIAL COLECTOMY N/A 12/29/2013   Procedure: RIGHT HEMICOLECTOMY;  Surgeon: Jamesetta So, MD;  Location: [facility] ORS;  Service: General;  Laterality: N/A;  . PORT-A-CATH REMOVAL Right 04/01/2014   Procedure: MINOR REMOVAL PORT-A-CATH;  Surgeon: Jamesetta So, MD;  Location: [facility] ORS;  Service: General;  Laterality: Right;  . PORTACATH PLACEMENT Right 02/04/2014   Procedure: INSERTION PORT-A-CATH;  Surgeon: Jamesetta So, MD;  Location: [facility] ORS;  Service: General;  Laterality: Right;   Positive for left leg pain and foot pain.(Partially relieved with Neurontin) patient was advised epidural injection and has declined the present time Denies any headaches, dizziness, double vision, fevers, chills, night sweats, nausea, vomiting, diarrhea, constipation, chest pain, heart palpitations, shortness of breath, blood in stool, black tarry stool, urinary pain, urinary burning, urinary frequency, hematuria. 14 point review of systems was performed and is negative except as detailed under history of present illness and above    PHYSICAL EXAMINATION  ECOG PERFORMANCE STATUS: 0 - Asymptomatic  Blood pressure 131/73, pulse 73, temperature 98.3 F (36.8 C), temperature source Oral, resp. rate 16, height 5\' 4"  (1.626 m), weight 170 lb 8 oz (77.3 kg), SpO2 98 %.   GENERAL:alert, no distress, well nourished, well developed, comfortable, cooperative and smiling, accompanied by her husband SKIN: skin color, texture, turgor are normal, no rashes or significant lesions HEAD: Normocephalic, No masses, lesions, tenderness or abnormalities EYES: normal, PERRLA, EOMI, Conjunctiva are pink and non-injected EARS: External ears normal OROPHARYNX:lips, buccal mucosa, and tongue normal and mucous membranes are moist  NECK: supple, no adenopathy, thyroid normal size, non-tender, without nodularity, no stridor, non-tender, trachea  midline LYMPH:  no palpable lymphadenopathy BREAST:not examined LUNGS: clear to auscultation and percussion HEART: S1/S2 audible, occasional pause apprecitated ABDOMEN:abdomen soft, non-tender, normal bowel sounds, no masses or organomegaly and no hepatosplenomegaly. No rebound or guarding.  BACK: Back symmetric, no curvature., No CVA tenderness EXTREMITIES:less then 2 second capillary refill, no joint deformities, effusion, or inflammation, no edema, no skin discoloration, no clubbing, no cyanosis  NEURO: alert & oriented x 3 with fluent speech, no focal motor/sensory deficits, gait normal   LABORATORY DATA: I have reviewed the data as listed.  CBC    Component Value Date/Time   WBC 7.8 04/07/2016 1031   RBC 4.67 04/07/2016 1031   HGB 14.0 04/07/2016 1031   HCT 42.1 04/07/2016 1031   PLT 207 04/07/2016 1031   MCV 90.1 04/07/2016 1031   MCH 30.0 04/07/2016 1031   MCHC 33.3 04/07/2016 1031   RDW 13.0 04/07/2016 1031   LYMPHSABS 2.1 04/07/2016 1031   MONOABS 0.7 04/07/2016 1031   EOSABS 0.3 04/07/2016 1031   BASOSABS 0.0 04/07/2016 1031   CMP     Component Value Date/Time   NA 138 04/07/2016 1031   K 4.8 04/07/2016 1031   CL  102 04/07/2016 1031   CO2 29 04/07/2016 1031   GLUCOSE 139 (H) 04/07/2016 1031   BUN 15 04/07/2016 1031   CREATININE 0.79 04/07/2016 1031   CALCIUM 9.7 04/07/2016 1031   PROT 7.8 04/07/2016 1031   ALBUMIN 4.3 04/07/2016 1031   AST 29 04/07/2016 1031   ALT 22 04/07/2016 1031   ALKPHOS 68 04/07/2016 1031   BILITOT 0.3 04/07/2016 1031   GFRNONAA >60 04/07/2016 1031   GFRAA >60 04/07/2016 1031   Lab Results  Component Value Date   CEA 3.7 04/07/2016       RADIOLOGY: I have personally reviewed the radiological images as listed and agreed with the findings in the report.  Study Result     CLINICAL DATA: Low back pain radiating to the left leg come 6 months duration  EXAM: MRI LUMBAR SPINE WITHOUT CONTRAST  TECHNIQUE: Multiplanar,  multisequence MR imaging of the lumbar spine was performed. No intravenous contrast was administered.  COMPARISON: Radiography 10/14/2015  FINDINGS: Segmentation: 5 lumbar type vertebral bodies.  Alignment: Curvature convex to the right with the apex at L3.  Vertebrae: No fracture or focal osseous lesion.  Conus medullaris: Extends to the T12-L1 level and appears normal.  Paraspinal and other soft tissues: No significant finding.  Disc levels:  L1-2: Mild bulging of the disc. No stenosis.  L2-3: Mild bulging of the disc. Mild facet hypertrophy. No stenosis.  L3-4: Shallow protrusion of the disc more prominent towards the left. Slight indentation of the thecal sac. Mild narrowing of the left lateral recess and intervertebral foramen on the left, but without likely neural compression.  L4-5: Disc degeneration with shallow broad-based herniation of disc material. Bilateral facet arthropathy with hypertrophic change, joint fluid and marrow edema. Stenosis of both lateral recesses that could cause neural compression on either or both sides. The facet arthropathy because of pain as well.  L5-S1: Normal interspace.  IMPRESSION: The dominant findings are at L4-5. The disc is degenerated and there is a broad-based disc herniation. There is bilateral facet arthropathy with hypertrophy and edema. There is stenosis of the lateral recesses that could cause neural compression on either or both sides. The facet arthropathy could be a cause of low back pain or referred facet syndrome pain.  Shallow disc protrusion at L3-4 more prominent towards the left. Mild narrowing of the left lateral recess and intervertebral foramen on the left but without definite compressive stenosis.   Electronically Signed  By: Nelson Chimes M.D.  On: 12/22/2015 08:46     Study Result     CLINICAL DATA: Fall 6 weeks ago with persistent left hip pain, initial  encounter  EXAM: DG HIP (WITH OR WITHOUT PELVIS) 2-3V LEFT  COMPARISON: None.  FINDINGS: No acute fracture or dislocation is noted. Pelvic ring is intact. An exostosis is noted arising from the lateral aspect of the left iliac wing.  IMPRESSION: No acute abnormality noted.   Electronically Signed  By: Inez Catalina M.D.  On: 08/24/2015 08:54     ASSESSMENT AND PLAN:  History of STAGE III CRC Surgical Resection Adjuvant FOLFOX X 3 cycles only Abnormal follow-up CT of the abdomen Abnormal PET imaging of the abdomen/lung Left leg/low back pain/sciatica   Clinically she is having low back/leg pain which she describes as sciatic type pain. Last PET in December overall showed stability in abnormal findings and resolution of adenopathy at the L thoracic inlet. She does not want to pursue another PET at this point. She has refused biopsy to  date. She is following with orthopedics. She is agreeable to CT imaging given the progressive nature of her pain. Weight is stable.   Will call her with results of her imaging. If progression noted she will return for additional discussion. Otherwise RTC in 3 months with ongoing surveillance.   Because a previously abnormal CT scan of chest abdomen.  Will repeat CT scan of chest abdomen and pelvis prior to next visit Multiple pulmonary nodules would be followed.  Previously was found to be stable,  She was encouraged to get a mammogram basically decided she would get it through her primary care physician.   All lab data has been reviewed vital signs have been reviewed

## 2016-04-25 ENCOUNTER — Other Ambulatory Visit (HOSPITAL_COMMUNITY): Payer: Self-pay | Admitting: Internal Medicine

## 2016-04-25 DIAGNOSIS — Z1231 Encounter for screening mammogram for malignant neoplasm of breast: Secondary | ICD-10-CM

## 2016-04-28 ENCOUNTER — Other Ambulatory Visit: Payer: Self-pay | Admitting: Orthopedic Surgery

## 2016-04-28 DIAGNOSIS — M5442 Lumbago with sciatica, left side: Secondary | ICD-10-CM

## 2016-05-08 ENCOUNTER — Ambulatory Visit (HOSPITAL_COMMUNITY)
Admission: RE | Admit: 2016-05-08 | Discharge: 2016-05-08 | Disposition: A | Discharge status: Home / Self Care | Payer: Medicare Other | Source: Ambulatory Visit | Attending: Internal Medicine | Admitting: Internal Medicine

## 2016-05-08 DIAGNOSIS — Z1231 Encounter for screening mammogram for malignant neoplasm of breast: Secondary | ICD-10-CM

## 2016-09-18 ENCOUNTER — Emergency Department (HOSPITAL_COMMUNITY): Payer: No Typology Code available for payment source

## 2016-09-18 ENCOUNTER — Emergency Department (HOSPITAL_COMMUNITY)
Admission: EM | Admit: 2016-09-18 | Discharge: 2016-09-18 | Disposition: A | Discharge status: Home / Self Care | Payer: No Typology Code available for payment source | Attending: Emergency Medicine | Admitting: Emergency Medicine

## 2016-09-18 ENCOUNTER — Encounter (HOSPITAL_COMMUNITY): Payer: Self-pay | Admitting: Cardiology

## 2016-09-18 DIAGNOSIS — Y92410 Unspecified street and highway as the place of occurrence of the external cause: Secondary | ICD-10-CM | POA: Insufficient documentation

## 2016-09-18 DIAGNOSIS — R0781 Pleurodynia: Secondary | ICD-10-CM | POA: Diagnosis not present

## 2016-09-18 DIAGNOSIS — Z87891 Personal history of nicotine dependence: Secondary | ICD-10-CM | POA: Insufficient documentation

## 2016-09-18 DIAGNOSIS — Z85038 Personal history of other malignant neoplasm of large intestine: Secondary | ICD-10-CM | POA: Diagnosis not present

## 2016-09-18 DIAGNOSIS — Y939 Activity, unspecified: Secondary | ICD-10-CM | POA: Diagnosis not present

## 2016-09-18 DIAGNOSIS — Z7982 Long term (current) use of aspirin: Secondary | ICD-10-CM | POA: Diagnosis not present

## 2016-09-18 DIAGNOSIS — Y999 Unspecified external cause status: Secondary | ICD-10-CM | POA: Insufficient documentation

## 2016-09-18 DIAGNOSIS — J111 Influenza due to unidentified influenza virus with other respiratory manifestations: Secondary | ICD-10-CM | POA: Diagnosis not present

## 2016-09-18 DIAGNOSIS — S2232XA Fracture of one rib, left side, initial encounter for closed fracture: Secondary | ICD-10-CM | POA: Diagnosis not present

## 2016-09-18 DIAGNOSIS — Z79899 Other long term (current) drug therapy: Secondary | ICD-10-CM | POA: Insufficient documentation

## 2016-09-18 DIAGNOSIS — J40 Bronchitis, not specified as acute or chronic: Secondary | ICD-10-CM | POA: Diagnosis not present

## 2016-09-18 DIAGNOSIS — S299XXA Unspecified injury of thorax, initial encounter: Secondary | ICD-10-CM | POA: Diagnosis present

## 2016-09-18 MED ORDER — OXYCODONE-ACETAMINOPHEN 5-325 MG PO TABS: 1.0000 | ORAL_TABLET | ORAL | 0 refills | Status: DC | PRN

## 2016-09-18 MED ORDER — IBUPROFEN 600 MG PO TABS: 600.0000 mg | ORAL_TABLET | Freq: Four times a day (QID) | ORAL | 0 refills | Status: DC | PRN

## 2016-09-18 MED ORDER — DIAZEPAM 5 MG PO TABS: 2.5000 mg | ORAL_TABLET | Freq: Four times a day (QID) | ORAL | 0 refills | Status: DC | PRN

## 2016-09-18 MED ORDER — HYDROMORPHONE HCL 1 MG/ML IJ SOLN
1.0000 mg | Freq: Once | INTRAMUSCULAR | Status: AC
  Administered 2016-09-18: 1 mg via INTRAMUSCULAR
  Filled 2016-09-18: qty 1

## 2016-09-18 MED ORDER — LORAZEPAM 0.5 MG PO TABS
0.5000 mg | ORAL_TABLET | Freq: Once | ORAL | Status: AC
  Administered 2016-09-18: 0.5 mg via ORAL
  Filled 2016-09-18: qty 1

## 2016-09-18 MED ORDER — KETOROLAC TROMETHAMINE 30 MG/ML IJ SOLN
15.0000 mg | Freq: Once | INTRAMUSCULAR | Status: AC
Start: 2016-09-18 — End: 2016-09-18
  Administered 2016-09-18: 15 mg via INTRAMUSCULAR
  Filled 2016-09-18: qty 1
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
## 2016-09-25 ENCOUNTER — Ambulatory Visit: Payer: Medicare Other | Admitting: Orthopedic Surgery
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
## 2016-10-10 ENCOUNTER — Other Ambulatory Visit (HOSPITAL_COMMUNITY): Payer: Self-pay | Admitting: *Deleted

## 2016-10-11 ENCOUNTER — Encounter (HOSPITAL_COMMUNITY): Payer: Medicare Other | Attending: Oncology

## 2016-10-11 DIAGNOSIS — R918 Other nonspecific abnormal finding of lung field: Secondary | ICD-10-CM | POA: Insufficient documentation

## 2016-10-11 DIAGNOSIS — C184 Malignant neoplasm of transverse colon: Secondary | ICD-10-CM | POA: Diagnosis not present

## 2016-10-11 LAB — COMPREHENSIVE METABOLIC PANEL
ALBUMIN: 4.3 g/dL (ref 3.5–5.0)
ALT: 16 U/L (ref 14–54)
AST: 22 U/L (ref 15–41)
Alkaline Phosphatase: 82 U/L (ref 38–126)
Anion gap: 9 (ref 5–15)
BUN: 12 mg/dL (ref 6–20)
CHLORIDE: 100 mmol/L — AB (ref 101–111)
CO2: 28 mmol/L (ref 22–32)
Calcium: 9.7 mg/dL (ref 8.9–10.3)
Creatinine, Ser: 0.73 mg/dL (ref 0.44–1.00)
GFR calc Af Amer: >60 mL/min (ref >60)
Glucose, Bld: 120 mg/dL — ABNORMAL HIGH (ref 65–99)
POTASSIUM: 3.6 mmol/L (ref 3.5–5.1)
Sodium: 137 mmol/L (ref 135–145)
TOTAL PROTEIN: 7.9 g/dL (ref 6.5–8.1)
Total Bilirubin: 0.3 mg/dL (ref 0.3–1.2)

## 2016-10-11 LAB — CBC WITH DIFFERENTIAL/PLATELET
BASOS ABS: 0 10*3/uL (ref 0.0–0.1)
Basophils Relative: 1 %
EOS PCT: 2 %
Eosinophils Absolute: 0.1 10*3/uL (ref 0.0–0.7)
HCT: 42.5 % (ref 36.0–46.0)
Hemoglobin: 14.3 g/dL (ref 12.0–15.0)
Lymphocytes Relative: 26 %
Lymphs Abs: 2 10*3/uL (ref 0.7–4.0)
MCH: 30.4 pg (ref 26.0–34.0)
MCHC: 33.6 g/dL (ref 30.0–36.0)
MCV: 90.4 fL (ref 78.0–100.0)
MONO ABS: 0.8 10*3/uL (ref 0.1–1.0)
Monocytes Relative: 10 %
Neutro Abs: 4.8 10*3/uL (ref 1.7–7.7)
Neutrophils Relative %: 61 %
PLATELETS: 215 10*3/uL (ref 150–400)
RBC: 4.7 MIL/uL (ref 3.87–5.11)
RDW: 13.3 % (ref 11.5–15.5)
WBC: 7.7 10*3/uL (ref 4.0–10.5)

## 2016-10-12 LAB — CEA: CEA: 3.5 ng/mL (ref 0.0–4.7)

## 2016-10-16 ENCOUNTER — Encounter: Payer: Self-pay | Admitting: Orthopedic Surgery

## 2016-10-16 ENCOUNTER — Ambulatory Visit (INDEPENDENT_AMBULATORY_CARE_PROVIDER_SITE_OTHER): Payer: Medicare Other | Admitting: Orthopedic Surgery

## 2016-10-16 DIAGNOSIS — M5126 Other intervertebral disc displacement, lumbar region: Secondary | ICD-10-CM | POA: Diagnosis not present

## 2016-10-16 DIAGNOSIS — M48061 Spinal stenosis, lumbar region without neurogenic claudication: Secondary | ICD-10-CM

## 2016-10-16 NOTE — Patient Instructions (Signed)
We will refer you to [facility]
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
## 2016-10-18 ENCOUNTER — Ambulatory Visit (HOSPITAL_COMMUNITY)
Admission: RE | Admit: 2016-10-18 | Discharge: 2016-10-18 | Disposition: A | Discharge status: Home / Self Care | Payer: Medicare Other | Source: Ambulatory Visit | Attending: Oncology | Admitting: Oncology

## 2016-10-18 DIAGNOSIS — K429 Umbilical hernia without obstruction or gangrene: Secondary | ICD-10-CM | POA: Insufficient documentation

## 2016-10-18 DIAGNOSIS — M5136 Other intervertebral disc degeneration, lumbar region: Secondary | ICD-10-CM | POA: Diagnosis not present

## 2016-10-18 DIAGNOSIS — S2231XA Fracture of one rib, right side, initial encounter for closed fracture: Secondary | ICD-10-CM | POA: Diagnosis not present

## 2016-10-18 DIAGNOSIS — X58XXXA Exposure to other specified factors, initial encounter: Secondary | ICD-10-CM | POA: Insufficient documentation

## 2016-10-18 DIAGNOSIS — C184 Malignant neoplasm of transverse colon: Secondary | ICD-10-CM | POA: Diagnosis not present

## 2016-10-18 DIAGNOSIS — S2242XA Multiple fractures of ribs, left side, initial encounter for closed fracture: Secondary | ICD-10-CM | POA: Diagnosis not present

## 2016-10-18 DIAGNOSIS — K5730 Diverticulosis of large intestine without perforation or abscess without bleeding: Secondary | ICD-10-CM | POA: Insufficient documentation

## 2016-10-18 DIAGNOSIS — M47896 Other spondylosis, lumbar region: Secondary | ICD-10-CM | POA: Insufficient documentation

## 2016-10-18 DIAGNOSIS — C189 Malignant neoplasm of colon, unspecified: Secondary | ICD-10-CM | POA: Diagnosis not present

## 2016-10-18 DIAGNOSIS — R918 Other nonspecific abnormal finding of lung field: Secondary | ICD-10-CM | POA: Diagnosis not present

## 2016-10-18 DIAGNOSIS — I517 Cardiomegaly: Secondary | ICD-10-CM | POA: Insufficient documentation

## 2016-10-18 MED ORDER — IOPAMIDOL (ISOVUE-300) INJECTION 61%
100.0000 mL | Freq: Once | INTRAVENOUS | Status: AC | PRN
  Administered 2016-10-18: 100 mL via INTRAVENOUS

## 2016-10-23 DIAGNOSIS — R030 Elevated blood-pressure reading, without diagnosis of hypertension: Secondary | ICD-10-CM | POA: Diagnosis not present

## 2016-10-23 DIAGNOSIS — M549 Dorsalgia, unspecified: Secondary | ICD-10-CM | POA: Diagnosis not present

## 2016-10-23 DIAGNOSIS — M5442 Lumbago with sciatica, left side: Secondary | ICD-10-CM | POA: Diagnosis not present

## 2016-10-23 DIAGNOSIS — G8929 Other chronic pain: Secondary | ICD-10-CM | POA: Diagnosis not present

## 2016-10-23 DIAGNOSIS — Z6832 Body mass index (BMI) 32.0-32.9, adult: Secondary | ICD-10-CM | POA: Diagnosis not present

## 2016-10-26 ENCOUNTER — Encounter (HOSPITAL_COMMUNITY): Payer: Self-pay | Admitting: Adult Health

## 2016-10-26 ENCOUNTER — Encounter (HOSPITAL_BASED_OUTPATIENT_CLINIC_OR_DEPARTMENT_OTHER): Payer: Medicare Other | Admitting: Adult Health

## 2016-10-26 VITALS — BP 164/64 | HR 83 | Temp 98.2°F | Resp 16 | Ht 63.0 in | Wt 170.0 lb

## 2016-10-26 DIAGNOSIS — Z85038 Personal history of other malignant neoplasm of large intestine: Secondary | ICD-10-CM

## 2016-10-26 DIAGNOSIS — R918 Other nonspecific abnormal finding of lung field: Secondary | ICD-10-CM | POA: Diagnosis not present

## 2016-10-26 DIAGNOSIS — C184 Malignant neoplasm of transverse colon: Secondary | ICD-10-CM

## 2016-10-26 NOTE — Patient Instructions (Addendum)
[place] at [facility] Discharge Instructions  RECOMMENDATIONS MADE BY THE CONSULTANT AND ANY TEST RESULTS WILL BE SENT TO YOUR REFERRING PHYSICIAN.  You were seen today by Mike Craze NP Ct scan in 3 months  Follow up in 6 months with lab work   Thank you for choosing [facility] at [facility] to provide your oncology and hematology care.  To afford each patient quality time with our provider, please arrive at least 15 minutes before your scheduled appointment time.    If you have a lab appointment with the [facility] please come in thru the  Main Entrance and check in at the main information desk  You need to re-schedule your appointment should you arrive 10 or more minutes late.  We strive to give you quality time with our providers, and arriving late affects you and other patients whose appointments are after yours.  Also, if you no show three or more times for appointments you may be dismissed from the clinic at the providers discretion.     Again, thank you for choosing [facility].  Our hope is that these requests will decrease the amount of time that you wait before being seen by our physicians.       _____________________________________________________________  Should you have questions after your visit to [facility], please contact our office at [phone] between the hours of 8:30 a.m. and 4:30 p.m.  Voicemails left after 4:30 p.m. will not be returned until the following business day.  For prescription refill requests, have your pharmacy contact our office.       Resources For Cancer Patients and their Caregivers ? American Cancer Society: Can assist with transportation, wigs, general needs, runs Look Good Feel Better.        431-035-6043 ? Cancer Care: Provides financial assistance, online support groups, medication/co-pay assistance.  1-800-813-HOPE 548-766-9594) ? [facility] Assists [place] cancer patients and their families through emotional , educational and financial support.  (249)743-4504 ? [place] DSS Where to apply for food stamps, Medicaid and utility assistance. 639 642 0185 ? [facility]: Transportation to medical appointments. (754) 235-6799 ? Social Security Administration: May apply for disability if have a Stage IV cancer. (229)613-0456 5643042077 ? [employer], Disability and Transit Services: Assists with nutrition, care and transit needs. [place] Support Programs: @10RELATIVEDAYS @ > Cancer Support Group  2nd Tuesday of the month 1pm-2pm, Journey Room  > Creative Journey  3rd Tuesday of the month 1130am-1pm, Journey Room  > Look Good Feel Better  1st Wednesday of the month 10am-12 noon, Journey Room (Call [facility] to register [phone])
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
## 2016-11-14 DIAGNOSIS — M5416 Radiculopathy, lumbar region: Secondary | ICD-10-CM | POA: Diagnosis not present

## 2016-11-14 DIAGNOSIS — M48062 Spinal stenosis, lumbar region with neurogenic claudication: Secondary | ICD-10-CM | POA: Diagnosis not present

## 2016-11-14 DIAGNOSIS — R030 Elevated blood-pressure reading, without diagnosis of hypertension: Secondary | ICD-10-CM | POA: Diagnosis not present

## 2016-11-15 DIAGNOSIS — R030 Elevated blood-pressure reading, without diagnosis of hypertension: Secondary | ICD-10-CM | POA: Diagnosis not present

## 2016-11-15 DIAGNOSIS — M5416 Radiculopathy, lumbar region: Secondary | ICD-10-CM | POA: Diagnosis not present

## 2016-11-30 DIAGNOSIS — M48062 Spinal stenosis, lumbar region with neurogenic claudication: Secondary | ICD-10-CM | POA: Diagnosis not present

## 2016-11-30 DIAGNOSIS — Z6829 Body mass index (BMI) 29.0-29.9, adult: Secondary | ICD-10-CM | POA: Diagnosis not present

## 2016-11-30 DIAGNOSIS — R030 Elevated blood-pressure reading, without diagnosis of hypertension: Secondary | ICD-10-CM | POA: Diagnosis not present

## 2016-12-19 DIAGNOSIS — M5416 Radiculopathy, lumbar region: Secondary | ICD-10-CM | POA: Diagnosis not present

## 2016-12-19 DIAGNOSIS — M48062 Spinal stenosis, lumbar region with neurogenic claudication: Secondary | ICD-10-CM | POA: Diagnosis not present

## 2017-01-09 DIAGNOSIS — Z0000 Encounter for general adult medical examination without abnormal findings: Secondary | ICD-10-CM | POA: Diagnosis not present

## 2017-01-09 DIAGNOSIS — Z6828 Body mass index (BMI) 28.0-28.9, adult: Secondary | ICD-10-CM | POA: Diagnosis not present

## 2017-01-09 DIAGNOSIS — Z1389 Encounter for screening for other disorder: Secondary | ICD-10-CM | POA: Diagnosis not present

## 2017-01-09 DIAGNOSIS — E663 Overweight: Secondary | ICD-10-CM | POA: Diagnosis not present

## 2017-01-22 ENCOUNTER — Ambulatory Visit (HOSPITAL_COMMUNITY)
Admission: RE | Admit: 2017-01-22 | Discharge: 2017-01-22 | Disposition: A | Discharge status: Home / Self Care | Payer: Medicare Other | Source: Ambulatory Visit | Attending: Adult Health | Admitting: Adult Health

## 2017-01-22 DIAGNOSIS — K429 Umbilical hernia without obstruction or gangrene: Secondary | ICD-10-CM | POA: Diagnosis not present

## 2017-01-22 DIAGNOSIS — Z9049 Acquired absence of other specified parts of digestive tract: Secondary | ICD-10-CM | POA: Insufficient documentation

## 2017-01-22 DIAGNOSIS — C189 Malignant neoplasm of colon, unspecified: Secondary | ICD-10-CM | POA: Diagnosis not present

## 2017-01-22 DIAGNOSIS — Q676 Pectus excavatum: Secondary | ICD-10-CM | POA: Insufficient documentation

## 2017-01-22 DIAGNOSIS — M47816 Spondylosis without myelopathy or radiculopathy, lumbar region: Secondary | ICD-10-CM | POA: Insufficient documentation

## 2017-01-22 DIAGNOSIS — M5136 Other intervertebral disc degeneration, lumbar region: Secondary | ICD-10-CM | POA: Insufficient documentation

## 2017-01-22 DIAGNOSIS — R918 Other nonspecific abnormal finding of lung field: Secondary | ICD-10-CM | POA: Insufficient documentation

## 2017-01-22 DIAGNOSIS — C184 Malignant neoplasm of transverse colon: Secondary | ICD-10-CM | POA: Diagnosis not present

## 2017-01-22 DIAGNOSIS — K5790 Diverticulosis of intestine, part unspecified, without perforation or abscess without bleeding: Secondary | ICD-10-CM | POA: Insufficient documentation

## 2017-01-22 LAB — POCT I-STAT CREATININE: CREATININE: 0.7 mg/dL (ref 0.44–1.00)

## 2017-01-22 MED ORDER — IOPAMIDOL (ISOVUE-300) INJECTION 61%
100.0000 mL | Freq: Once | INTRAVENOUS | Status: AC | PRN
  Administered 2017-01-22: 100 mL via INTRAVENOUS

## 2017-01-23 ENCOUNTER — Other Ambulatory Visit (HOSPITAL_COMMUNITY): Payer: Self-pay | Admitting: Adult Health

## 2017-01-23 DIAGNOSIS — C184 Malignant neoplasm of transverse colon: Secondary | ICD-10-CM

## 2017-02-02 ENCOUNTER — Other Ambulatory Visit: Payer: Self-pay

## 2017-04-19 ENCOUNTER — Other Ambulatory Visit (HOSPITAL_COMMUNITY): Payer: Self-pay | Admitting: *Deleted

## 2017-04-19 DIAGNOSIS — C184 Malignant neoplasm of transverse colon: Secondary | ICD-10-CM

## 2017-04-20 ENCOUNTER — Encounter (HOSPITAL_COMMUNITY): Payer: Medicare Other | Attending: Oncology

## 2017-04-20 DIAGNOSIS — C184 Malignant neoplasm of transverse colon: Secondary | ICD-10-CM | POA: Insufficient documentation

## 2017-04-20 LAB — CBC WITH DIFFERENTIAL/PLATELET
Basophils Absolute: 0 10*3/uL (ref 0.0–0.1)
Basophils Relative: 0 %
EOS ABS: 0.1 10*3/uL (ref 0.0–0.7)
Eosinophils Relative: 2 %
HEMATOCRIT: 41.9 % (ref 36.0–46.0)
HEMOGLOBIN: 14 g/dL (ref 12.0–15.0)
LYMPHS ABS: 2.3 10*3/uL (ref 0.7–4.0)
Lymphocytes Relative: 31 %
MCH: 30.6 pg (ref 26.0–34.0)
MCHC: 33.4 g/dL (ref 30.0–36.0)
MCV: 91.7 fL (ref 78.0–100.0)
MONO ABS: 0.7 10*3/uL (ref 0.1–1.0)
MONOS PCT: 9 %
NEUTROS PCT: 58 %
Neutro Abs: 4.2 10*3/uL (ref 1.7–7.7)
Platelets: 197 10*3/uL (ref 150–400)
RBC: 4.57 MIL/uL (ref 3.87–5.11)
RDW: 12.7 % (ref 11.5–15.5)
WBC: 7.3 10*3/uL (ref 4.0–10.5)

## 2017-04-20 LAB — COMPREHENSIVE METABOLIC PANEL
ALK PHOS: 65 U/L (ref 38–126)
ALT: 18 U/L (ref 14–54)
ANION GAP: 8 (ref 5–15)
AST: 23 U/L (ref 15–41)
Albumin: 4.3 g/dL (ref 3.5–5.0)
BILIRUBIN TOTAL: 0.3 mg/dL (ref 0.3–1.2)
BUN: 12 mg/dL (ref 6–20)
CALCIUM: 9.6 mg/dL (ref 8.9–10.3)
CO2: 29 mmol/L (ref 22–32)
Chloride: 102 mmol/L (ref 101–111)
Creatinine, Ser: 0.7 mg/dL (ref 0.44–1.00)
GFR calc non Af Amer: >60 mL/min (ref >60)
Glucose, Bld: 125 mg/dL — ABNORMAL HIGH (ref 65–99)
POTASSIUM: 4 mmol/L (ref 3.5–5.1)
SODIUM: 139 mmol/L (ref 135–145)
TOTAL PROTEIN: 7.8 g/dL (ref 6.5–8.1)

## 2017-04-21 LAB — CEA: CEA: 4 ng/mL (ref 0.0–4.7)

## 2017-04-25 ENCOUNTER — Ambulatory Visit (HOSPITAL_COMMUNITY)
Admission: RE | Admit: 2017-04-25 | Discharge: 2017-04-25 | Disposition: A | Discharge status: Home / Self Care | Payer: Medicare Other | Source: Ambulatory Visit | Attending: Adult Health | Admitting: Adult Health

## 2017-04-25 DIAGNOSIS — C189 Malignant neoplasm of colon, unspecified: Secondary | ICD-10-CM | POA: Diagnosis not present

## 2017-04-25 DIAGNOSIS — R918 Other nonspecific abnormal finding of lung field: Secondary | ICD-10-CM | POA: Insufficient documentation

## 2017-04-25 DIAGNOSIS — C184 Malignant neoplasm of transverse colon: Secondary | ICD-10-CM | POA: Insufficient documentation

## 2017-04-25 MED ORDER — IOPAMIDOL (ISOVUE-300) INJECTION 61%
100.0000 mL | Freq: Once | INTRAVENOUS | Status: AC | PRN
  Administered 2017-04-25: 100 mL via INTRAVENOUS

## 2017-04-30 ENCOUNTER — Encounter (HOSPITAL_COMMUNITY): Payer: Self-pay | Admitting: Oncology

## 2017-04-30 ENCOUNTER — Other Ambulatory Visit (HOSPITAL_COMMUNITY): Payer: Medicare Other

## 2017-04-30 ENCOUNTER — Encounter (HOSPITAL_BASED_OUTPATIENT_CLINIC_OR_DEPARTMENT_OTHER): Payer: Medicare Other | Admitting: Oncology

## 2017-04-30 VITALS — BP 167/70 | HR 86 | Temp 98.2°F | Resp 18 | Wt 168.0 lb

## 2017-04-30 DIAGNOSIS — R197 Diarrhea, unspecified: Secondary | ICD-10-CM | POA: Diagnosis not present

## 2017-04-30 DIAGNOSIS — R918 Other nonspecific abnormal finding of lung field: Secondary | ICD-10-CM | POA: Diagnosis not present

## 2017-04-30 DIAGNOSIS — Z85038 Personal history of other malignant neoplasm of large intestine: Secondary | ICD-10-CM | POA: Diagnosis not present

## 2017-04-30 DIAGNOSIS — C184 Malignant neoplasm of transverse colon: Secondary | ICD-10-CM

## 2017-04-30 DIAGNOSIS — G589 Mononeuropathy, unspecified: Secondary | ICD-10-CM

## 2017-04-30 NOTE — Progress Notes (Signed)
[place], [place] [id number]   CLINIC:  Medical Oncology/Hematology  PCP:  [facility], [place] [address] [phone]   REASON FOR VISIT:  Follow-up for Stage III adenocarcinoma of transverse colon  CURRENT THERAPY: Surveillance    BRIEF ONCOLOGIC HISTORY:    Adenocarcinoma of transverse colon ([place])   12/09/2013 Initial Diagnosis    1. Colon, biopsy, proximal transverse mass INVASIVE ADENOCARCINOMA.      12/29/2013 Definitive Surgery    Colon, segmental resection for tumor, right - INVASIVE COLORECTAL ADENOCARCINOMA, 7 CM, EXTENDING INTO PERICOLONIC CONNECTIVE TISSUE. - METASTATIC CARCINOMA IN 6 OF 33 LYMPH NODES (6/33).      02/10/2014 - 03/10/2014 Adjuvant Chemotherapy    FOLFOX x 3 cycles      03/11/2014 Adverse Reaction    Patient called reporting diarrhea despite dose reduction and she wants to stop therapy.  Patient seen on 03/24/14 to discuss other treatment options and she stands by her decision to stop all therapy.      04/01/2014 Surgery    Port-a-cath removal by Dr. Arnoldo Morale.      01/12/2015 PET scan    Mild abdominal mesenteric LAD show hypermetabolic activity, 7 mm indeterminate pulm nodule in sup segment of RLL shows no assoc metabolic activity      1/66/0630 PET scan    Mild progression of nodal mets in anterior mid abdominal mesentery, new nodal mets at L thoracic inlet. stable 6 mm nodule in posterior RLL, unchanged since 2015      06/22/2015 PET scan    Resolution of metabolic activity and decreased size of mild LAD at L thoracic inlet, stable mild mid abd hypermetabolic mesenteric LAD, stable 6 mm posterior LLL pulm nodule without metabolic activity      1/60/1093 Imaging    MRI lumbar spine L4-L5 disc degenerated, broad based disc hernation, B facet arthropathy with hypertophy and edema, stenosis of lateral recesses that could cause neural compression      10/18/2016 Imaging    CT  CAP- 1. Several small ground-glass pulmonary nodules are present in the lungs and are stable over the past 9 months, but several are mildly larger than they were in 2015. Low-grade adenocarcinoma can sometimes have this appearance and surveillance is likely warranted. 2. There is a cluster of nodal tissue in the central mesentery, maximum short axis diameter 1.3 cm. This nodal cluster is relatively low in density and not appreciably changed from the prior exam. Given the lack of change is may represent quiescent residua of malignancy, but again, surveillance is likely warranted. 3. The left-sided rib fractures are more numerous than was revealed on the prior radiographs ; there are nondisplaced healing fractures of the left fifth, sixth, seventh, eighth, ninth, and tenth ribs. 4. Mild cardiomegaly although part of this appearance may be due to pectus excavatum. 5. Several additional small pulmonary nodules are stable from 2015 and considered benign. 6. Right hemicolectomy and sigmoid colon diverticulosis. 7. Small right paraumbilical hernia containing adipose tissue. 8. Lower lumbar spondylosis and degenerative disc disease potentially with mild impingement at L4-5.      04/25/2017 Imaging    CT C/A/P: Scattered solid pulmonary nodules measure up to 6 mm in the right lower lobe, unchanged. There are a few scattered ground-glass nodules measuring up to 8 mm in the right upper lobe, also stable. Distal perigastric lymph nodes are stable. No additional evidence of metastatic disease.  HISTORY OF PRESENT ILLNESS:  (From Dr. Metro Kung last note on 04/20/16)     INTERVAL HISTORY:  Ms. Caffee 71 y.o. female returns for follow-up for Stage III colon cancer.    Patient presents today with her son. She has been doing well since her last visit and has no new health issues or complaints. She has her chronic mild diarrhea since her colon surgery, which is well controlled with imodium.  She continues to have neuropathy in her left leg due to pinched nerve in her back.   REVIEW OF SYSTEMS:  Review of Systems  Constitutional: Negative.  Negative for chills, fatigue and fever.  HENT:  Negative.  Negative for lump/mass and nosebleeds.   Eyes: Negative.   Respiratory: Negative.  Negative for cough and shortness of breath.   Cardiovascular: Negative.  Negative for chest pain and leg swelling.  Gastrointestinal: Positive for diarrhea. Negative for abdominal pain, blood in stool, constipation, nausea and vomiting.  Endocrine: Negative.   Genitourinary: Negative.  Negative for dysuria and hematuria.   Musculoskeletal: Negative.  Negative for arthralgias.  Skin: Negative.  Negative for rash.  Neurological: Positive for numbness. Negative for dizziness and headaches.  Hematological: Negative.  Negative for adenopathy. Does not bruise/bleed easily.  Psychiatric/Behavioral: Negative.  Negative for depression and sleep disturbance. The patient is not nervous/anxious.      PAST MEDICAL/SURGICAL HISTORY:  Past Medical History:  Diagnosis Date  . Arthritis    wrist and thumbs  . Colon cancer ([place])   . PONV (postoperative nausea and vomiting)   . Ruptured lumbar disc    L1-L5 per patient report  . Sciatica of left side    Past Surgical History:  Procedure Laterality Date  . COLONOSCOPY N/A 12/12/2013   Procedure: COLONOSCOPY;  Surgeon: Rogene Houston, MD;  Location: [facility];  Service: Endoscopy;  Laterality: N/A;  730  . COLONOSCOPY N/A 01/20/2015   Procedure: COLONOSCOPY;  Surgeon: Rogene Houston, MD;  Location: [facility];  Service: Endoscopy;  Laterality: N/A;  1030  . EUS N/A 12/18/2013   Procedure: UPPER ENDOSCOPIC ULTRASOUND (EUS) LINEAR;  Surgeon: Milus Banister, MD;  Location: [facility];  Service: Endoscopy;  Laterality: N/A;  . herniated disc     1996-[id number]  . PARTIAL COLECTOMY N/A 12/29/2013   Procedure: RIGHT HEMICOLECTOMY;  Surgeon: Jamesetta So, MD;   Location: [facility];  Service: General;  Laterality: N/A;  . PORT-A-CATH REMOVAL Right 04/01/2014   Procedure: MINOR REMOVAL PORT-A-CATH;  Surgeon: Jamesetta So, MD;  Location: [facility];  Service: General;  Laterality: Right;  . PORTACATH PLACEMENT Right 02/04/2014   Procedure: INSERTION PORT-A-CATH;  Surgeon: Jamesetta So, MD;  Location: [facility];  Service: General;  Laterality: Right;     SOCIAL HISTORY:  Social History   Social History  . Marital status: Married    Spouse name: N/A  . Number of children: N/A  . Years of education: N/A   Occupational History  . Not on file.   Social History Main Topics  . Smoking status: Former Smoker    Packs/day: 0.25    Years: 5.00    Types: Cigarettes    Quit date: 04/19/1959  . Smokeless tobacco: Never Used     Comment: smoked for 5 years as teenager  . Alcohol use No  . Drug use: No  . Sexual activity: Yes    Birth control/ protection: Post-menopausal   Other Topics Concern  . Not on file  Social History Narrative  . No narrative on file    FAMILY HISTORY:  Family History  Problem Relation Age of Onset  . Diabetes type II Mother   . Dementia Father     CURRENT MEDICATIONS:  Outpatient Encounter Prescriptions as of 04/30/2017  Medication Sig  . aspirin (ASPIRIN EC) 81 MG EC tablet Take 81 mg by mouth daily. Swallow whole.  Marland Kitchen Bioflavonoid Products (ESTER C PO) Take 1,000 mg by mouth daily.  Marland Kitchen CRANBERRY PO Take 1 tablet by mouth daily.   Marland Kitchen DICLOFENAC POTASSIUM PO Take 50 mg by mouth 2 (two) times daily.  . Garlic 9211 MG CAPS Take 1,000 mg by mouth daily.   . Multiple Vitamin (MULTIVITAMIN) tablet Take 1 tablet by mouth daily.  . Omega-3 Fatty Acids (FISH OIL) 1200 MG CAPS Take 2,400 mg by mouth daily.    No facility-administered encounter medications on file as of 04/30/2017.     ALLERGIES:  Allergies  Allergen Reactions  . Sulfa Antibiotics Other (See Comments)    "Sores in my mouth"     PHYSICAL EXAM:  ECOG  Performance status: 0-1 - Mildly symptomatic; independent.   Vitals:   04/30/17 1023  BP: (!) 167/70  Pulse: 86  Resp: 18  Temp: 98.2 F (36.8 C)  SpO2: 100%   Filed Weights   04/30/17 1023  Weight: 168 lb (76.2 kg)    Physical Exam  Constitutional: She is oriented to person, place, and time and well-developed, well-nourished, and in no distress. No distress.  HENT:  Head: Normocephalic and atraumatic.  Mouth/Throat: Oropharynx is clear and moist. No oropharyngeal exudate.  Eyes: Pupils are equal, round, and reactive to light. Conjunctivae are normal. No scleral icterus.  Neck: Normal range of motion. Neck supple. No JVD present.  Cardiovascular: Normal rate, regular rhythm and normal heart sounds.  Exam reveals no gallop and no friction rub.   No murmur heard. Pulmonary/Chest: Effort normal and breath sounds normal. No respiratory distress. She has no wheezes. She has no rales.  Abdominal: Soft. Bowel sounds are normal. She exhibits no distension. There is no tenderness. There is no guarding.  Musculoskeletal: Normal range of motion. She exhibits no edema or tenderness.  Lymphadenopathy:    She has no cervical adenopathy.       Right: No supraclavicular adenopathy present.       Left: No supraclavicular adenopathy present.  Neurological: She is alert and oriented to person, place, and time. No cranial nerve deficit. Gait normal.  Skin: Skin is warm and dry. No rash noted. No erythema. No pallor.  Psychiatric: Mood, memory, affect and judgment normal.  Nursing note and vitals reviewed.    LABORATORY DATA:  I have reviewed the labs as listed.  CBC    Component Value Date/Time   WBC 7.3 04/20/2017 1100   RBC 4.57 04/20/2017 1100   HGB 14.0 04/20/2017 1100   HCT 41.9 04/20/2017 1100   PLT 197 04/20/2017 1100   MCV 91.7 04/20/2017 1100   MCH 30.6 04/20/2017 1100   MCHC 33.4 04/20/2017 1100   RDW 12.7 04/20/2017 1100   LYMPHSABS 2.3 04/20/2017 1100   MONOABS 0.7  04/20/2017 1100   EOSABS 0.1 04/20/2017 1100   BASOSABS 0.0 04/20/2017 1100   CMP Latest Ref Rng & Units 04/20/2017 01/22/2017 10/11/2016  Glucose 65 - 99 mg/dL 125(H) - 120(H)  BUN 6 - 20 mg/dL 12 - 12  Creatinine 0.44 - 1.00 mg/dL 0.70 0.70 0.73  Sodium 135 - 145  mmol/L 139 - 137  Potassium 3.5 - 5.1 mmol/L 4.0 - 3.6  Chloride 101 - 111 mmol/L 102 - 100(L)  CO2 22 - 32 mmol/L 29 - 28  Calcium 8.9 - 10.3 mg/dL 9.6 - 9.7  Total Protein 6.5 - 8.1 g/dL 7.8 - 7.9  Total Bilirubin 0.3 - 1.2 mg/dL 0.3 - 0.3  Alkaline Phos 38 - 126 U/L 65 - 82  AST 15 - 41 U/L 23 - 22  ALT 14 - 54 U/L 18 - 16     PENDING LABS:    DIAGNOSTIC IMAGING:  Most recent CT chest/abd/pelvis: 10/18/16       PATHOLOGY:  Surgical resection of colon path: 12/29/13       ASSESSMENT & PLAN:   Stage III adenocarcinoma of transverse colon: -Diagnosed in 12/2013; treated with segmental surgical resection of colon by Dr. Arnoldo Morale, followed by FOLFOX chemo x 3 cycles; chemotherapy course was complicated by diarrhea and further chemo was discontinued.  -CEA not elevated at 3.8 in 12/2013; serial CEA monitoring has been stable. Discussed the purpose of CEA monitoring in terms of colon cancer screening.  -Pt states she had colonoscopy in 2016, about 1 year after her colon cancer diagnosis (which would align with surveillance recommendations). We do not have those records.  -Clinically NED. I have reviewed her restaging scans in detail. The small pulm nodules and perigastric lymph nodes are stable. -Repeat CT C/A/P in 6 months. Labs on next visit as well.  NCCN Guidelines for colon cancer reviewed.       Diarrhea:  -Likely secondary to previous colon surgical resection.  -Well-controlled with prn Imodium; encouraged continued use as needed.       Dispo:  -Return to cancer center in 6 months with labs and CT.    All questions were answered to patient's stated satisfaction. Encouraged patient to call with  any new concerns or questions before her next visit to the cancer center and we can certain see her sooner, if needed.    Orders placed this encounter:  Orders Placed This Encounter  Procedures  . CT Abdomen Pelvis W Contrast  . CT Chest W Contrast  . CBC with Differential  . Comprehensive metabolic panel  . CEA    Twana First, MD

## 2017-05-25 DIAGNOSIS — R030 Elevated blood-pressure reading, without diagnosis of hypertension: Secondary | ICD-10-CM | POA: Diagnosis not present

## 2017-05-25 DIAGNOSIS — M5416 Radiculopathy, lumbar region: Secondary | ICD-10-CM | POA: Diagnosis not present

## 2017-05-25 DIAGNOSIS — Z6829 Body mass index (BMI) 29.0-29.9, adult: Secondary | ICD-10-CM | POA: Diagnosis not present

## 2017-09-20 DIAGNOSIS — H04123 Dry eye syndrome of bilateral lacrimal glands: Secondary | ICD-10-CM | POA: Diagnosis not present

## 2017-09-20 DIAGNOSIS — H2513 Age-related nuclear cataract, bilateral: Secondary | ICD-10-CM | POA: Diagnosis not present

## 2017-10-03 DIAGNOSIS — M48062 Spinal stenosis, lumbar region with neurogenic claudication: Secondary | ICD-10-CM | POA: Diagnosis not present

## 2017-10-03 DIAGNOSIS — Z6829 Body mass index (BMI) 29.0-29.9, adult: Secondary | ICD-10-CM | POA: Diagnosis not present

## 2017-10-03 DIAGNOSIS — R030 Elevated blood-pressure reading, without diagnosis of hypertension: Secondary | ICD-10-CM | POA: Diagnosis not present

## 2017-10-03 DIAGNOSIS — M5416 Radiculopathy, lumbar region: Secondary | ICD-10-CM | POA: Diagnosis not present

## 2017-10-23 ENCOUNTER — Other Ambulatory Visit (HOSPITAL_COMMUNITY): Payer: Self-pay | Admitting: *Deleted

## 2017-10-23 DIAGNOSIS — C184 Malignant neoplasm of transverse colon: Secondary | ICD-10-CM

## 2017-10-24 ENCOUNTER — Inpatient Hospital Stay (HOSPITAL_COMMUNITY): Payer: Medicare Other | Attending: Hematology

## 2017-10-24 DIAGNOSIS — R197 Diarrhea, unspecified: Secondary | ICD-10-CM | POA: Insufficient documentation

## 2017-10-24 DIAGNOSIS — K869 Disease of pancreas, unspecified: Secondary | ICD-10-CM | POA: Diagnosis not present

## 2017-10-24 DIAGNOSIS — R911 Solitary pulmonary nodule: Secondary | ICD-10-CM | POA: Diagnosis not present

## 2017-10-24 DIAGNOSIS — C184 Malignant neoplasm of transverse colon: Secondary | ICD-10-CM

## 2017-10-24 DIAGNOSIS — D509 Iron deficiency anemia, unspecified: Secondary | ICD-10-CM | POA: Diagnosis not present

## 2017-10-24 DIAGNOSIS — Z87891 Personal history of nicotine dependence: Secondary | ICD-10-CM | POA: Insufficient documentation

## 2017-10-24 DIAGNOSIS — Z85038 Personal history of other malignant neoplasm of large intestine: Secondary | ICD-10-CM | POA: Diagnosis not present

## 2017-10-24 LAB — COMPREHENSIVE METABOLIC PANEL
ALBUMIN: 4.3 g/dL (ref 3.5–5.0)
ALT: 20 U/L (ref 14–54)
AST: 23 U/L (ref 15–41)
Alkaline Phosphatase: 64 U/L (ref 38–126)
Anion gap: 11 (ref 5–15)
BUN: 15 mg/dL (ref 6–20)
CHLORIDE: 100 mmol/L — AB (ref 101–111)
CO2: 27 mmol/L (ref 22–32)
Calcium: 10.2 mg/dL (ref 8.9–10.3)
Creatinine, Ser: 0.69 mg/dL (ref 0.44–1.00)
GFR calc Af Amer: >60 mL/min (ref >60)
Glucose, Bld: 101 mg/dL — ABNORMAL HIGH (ref 65–99)
POTASSIUM: 3.7 mmol/L (ref 3.5–5.1)
SODIUM: 138 mmol/L (ref 135–145)
Total Bilirubin: 0.5 mg/dL (ref 0.3–1.2)
Total Protein: 7.9 g/dL (ref 6.5–8.1)

## 2017-10-24 LAB — CBC WITH DIFFERENTIAL/PLATELET
Basophils Absolute: 0 10*3/uL (ref 0.0–0.1)
Basophils Relative: 0 %
EOS ABS: 0.1 10*3/uL (ref 0.0–0.7)
Eosinophils Relative: 2 %
HCT: 43.5 % (ref 36.0–46.0)
Hemoglobin: 14.2 g/dL (ref 12.0–15.0)
LYMPHS ABS: 2.2 10*3/uL (ref 0.7–4.0)
Lymphocytes Relative: 29 %
MCH: 30.2 pg (ref 26.0–34.0)
MCHC: 32.6 g/dL (ref 30.0–36.0)
MCV: 92.6 fL (ref 78.0–100.0)
MONO ABS: 0.7 10*3/uL (ref 0.1–1.0)
MONOS PCT: 9 %
Neutro Abs: 4.6 10*3/uL (ref 1.7–7.7)
Neutrophils Relative %: 60 %
PLATELETS: 182 10*3/uL (ref 150–400)
RBC: 4.7 MIL/uL (ref 3.87–5.11)
RDW: 12.8 % (ref 11.5–15.5)
WBC: 7.6 10*3/uL (ref 4.0–10.5)

## 2017-10-25 LAB — CEA: CEA: 3.7 ng/mL (ref 0.0–4.7)

## 2017-10-31 ENCOUNTER — Ambulatory Visit (HOSPITAL_COMMUNITY)
Admission: RE | Admit: 2017-10-31 | Discharge: 2017-10-31 | Disposition: A | Discharge status: Home / Self Care | Payer: Medicare Other | Source: Ambulatory Visit | Attending: Oncology | Admitting: Oncology

## 2017-10-31 DIAGNOSIS — C184 Malignant neoplasm of transverse colon: Secondary | ICD-10-CM | POA: Diagnosis not present

## 2017-10-31 DIAGNOSIS — R918 Other nonspecific abnormal finding of lung field: Secondary | ICD-10-CM | POA: Diagnosis not present

## 2017-10-31 DIAGNOSIS — C189 Malignant neoplasm of colon, unspecified: Secondary | ICD-10-CM | POA: Diagnosis not present

## 2017-10-31 MED ORDER — IOPAMIDOL (ISOVUE-300) INJECTION 61%
100.0000 mL | Freq: Once | INTRAVENOUS | Status: AC | PRN
  Administered 2017-10-31: 100 mL via INTRAVENOUS

## 2017-11-05 ENCOUNTER — Other Ambulatory Visit: Payer: Self-pay

## 2017-11-05 ENCOUNTER — Encounter (HOSPITAL_COMMUNITY): Payer: Self-pay | Admitting: Internal Medicine

## 2017-11-05 ENCOUNTER — Inpatient Hospital Stay (HOSPITAL_BASED_OUTPATIENT_CLINIC_OR_DEPARTMENT_OTHER): Payer: Medicare Other | Admitting: Internal Medicine

## 2017-11-05 ENCOUNTER — Ambulatory Visit (HOSPITAL_COMMUNITY): Payer: Medicare Other

## 2017-11-05 VITALS — BP 180/77 | HR 69 | Temp 97.9°F | Resp 16 | Ht 63.0 in | Wt 167.0 lb

## 2017-11-05 DIAGNOSIS — C184 Malignant neoplasm of transverse colon: Secondary | ICD-10-CM

## 2017-11-05 DIAGNOSIS — K869 Disease of pancreas, unspecified: Secondary | ICD-10-CM | POA: Diagnosis not present

## 2017-11-05 DIAGNOSIS — Z85038 Personal history of other malignant neoplasm of large intestine: Secondary | ICD-10-CM | POA: Diagnosis not present

## 2017-11-05 DIAGNOSIS — Z87891 Personal history of nicotine dependence: Secondary | ICD-10-CM | POA: Diagnosis not present

## 2017-11-05 DIAGNOSIS — R911 Solitary pulmonary nodule: Secondary | ICD-10-CM

## 2017-11-05 DIAGNOSIS — D509 Iron deficiency anemia, unspecified: Secondary | ICD-10-CM | POA: Diagnosis not present

## 2017-11-05 DIAGNOSIS — R197 Diarrhea, unspecified: Secondary | ICD-10-CM

## 2017-11-05 NOTE — Progress Notes (Signed)
Diagnosis Adenocarcinoma of transverse colon ([place]) - Plan: CBC with Differential/Platelet, Comprehensive metabolic panel, Lactate dehydrogenase, CEA, CT Chest W Contrast, CT Abdomen Pelvis W Contrast  Staging Cancer Staging Adenocarcinoma of transverse colon ([place]) Staging form: Colon and Rectum, AJCC 7th Edition - Clinical: Stage IIIB (T3, N2a, M0) - Signed by Baird Cancer, PA-C on 02/08/2014 - Pathologic: No stage assigned - Unsigned   Assessment and Plan:  1.  Stage III adenocarcinoma of transverse colon.  Pt was diagnosed in 12/2013; She was previously followed by Dr Talbert Cage and was treated with segmental surgical resection of colon by Dr. Arnoldo Morale, followed by FOLFOX chemo x 3 cycles; chemotherapy course was complicated by diarrhea and further chemo was discontinued. CEA not elevated at 3.8 in 12/2013;   Labs done 10/24/2017 show white count 7.6 hemoglobin 14.2 platelets 182,000.  CEA 3.7.  Creatinine 0.69.    CT chest abdomen pelvis done 10/31/2017 showed  IMPRESSION: 1. Stable pulmonary nodules as detailed above. No new or progressive findings. 2. Stable mesenteric/perigastric lymph nodes. No new or progressive findings. 3. No evidence of metastatic disease involving the liver 4. Stable surgical changes from a partial right hemicolectomy. 5. Findings suggest pelvic congestion syndrome.  Patient will return to clinic in October 2019 for follow-up and repeat scans as well as labs.  She has undergone colonoscopy in 2016, about 1 year after her colon cancer diagnosis.  She reports she is scheduled to follow with Dr. Laural Golden  in June 2019.    2.  Diarrhea.   Felt secondary to previous colon surgical resection. Controlled with prn Imodium.    3.  IDA.  HB is 14.2.  Will check ferritin on RTC.    4.  Pancreatic lesion.  This is described as fatty changes.  Will continue to follow on subsequent imaging in 04/2018.    Interval History: 72 yo female diagnosed with Stage III CRC in 12/2013;  treated with segmental surgical resection of colon by Dr. Arnoldo Morale, followed by FOLFOX chemo x 3 cycles; chemotherapy course was complicated by diarrhea and further chemo was discontinued.   CEA not elevated at 3.8 in 12/2013;  Surgical resection of colon path: 12/29/13   Current Status: Patient is seen today for follow-up.  She is here today to go over her labs and scans.  She reports occasional diarrhea.    Adenocarcinoma of transverse colon ([place])   12/09/2013 Initial Diagnosis    1. Colon, biopsy, proximal transverse mass INVASIVE ADENOCARCINOMA.      12/29/2013 Definitive Surgery    Colon, segmental resection for tumor, right - INVASIVE COLORECTAL ADENOCARCINOMA, 7 CM, EXTENDING INTO PERICOLONIC CONNECTIVE TISSUE. - METASTATIC CARCINOMA IN 6 OF 33 LYMPH NODES (6/33).      02/10/2014 - 03/10/2014 Adjuvant Chemotherapy    FOLFOX x 3 cycles      03/11/2014 Adverse Reaction    Patient called reporting diarrhea despite dose reduction and she wants to stop therapy.  Patient seen on 03/24/14 to discuss other treatment options and she stands by her decision to stop all therapy.      04/01/2014 Surgery    Port-a-cath removal by Dr. Arnoldo Morale.      01/12/2015 PET scan    Mild abdominal mesenteric LAD show hypermetabolic activity, 7 mm indeterminate pulm nodule in sup segment of RLL shows no assoc metabolic activity      1/61/0960 PET scan    Mild progression of nodal mets in anterior mid abdominal mesentery, new nodal mets at L thoracic inlet.  stable 6 mm nodule in posterior RLL, unchanged since 2015      06/22/2015 PET scan    Resolution of metabolic activity and decreased size of mild LAD at L thoracic inlet, stable mild mid abd hypermetabolic mesenteric LAD, stable 6 mm posterior LLL pulm nodule without metabolic activity      01/26/9469 Imaging    MRI lumbar spine L4-L5 disc degenerated, broad based disc hernation, B facet arthropathy with hypertophy and edema, stenosis of lateral recesses that  could cause neural compression      10/18/2016 Imaging    CT CAP- 1. Several small ground-glass pulmonary nodules are present in the lungs and are stable over the past 9 months, but several are mildly larger than they were in 2015. Low-grade adenocarcinoma can sometimes have this appearance and surveillance is likely warranted. 2. There is a cluster of nodal tissue in the central mesentery, maximum short axis diameter 1.3 cm. This nodal cluster is relatively low in density and not appreciably changed from the prior exam. Given the lack of change is may represent quiescent residua of malignancy, but again, surveillance is likely warranted. 3. The left-sided rib fractures are more numerous than was revealed on the prior radiographs ; there are nondisplaced healing fractures of the left fifth, sixth, seventh, eighth, ninth, and tenth ribs. 4. Mild cardiomegaly although part of this appearance may be due to pectus excavatum. 5. Several additional small pulmonary nodules are stable from 2015 and considered benign. 6. Right hemicolectomy and sigmoid colon diverticulosis. 7. Small right paraumbilical hernia containing adipose tissue. 8. Lower lumbar spondylosis and degenerative disc disease potentially with mild impingement at L4-5.      04/25/2017 Imaging    CT C/A/P: Scattered solid pulmonary nodules measure up to 6 mm in the right lower lobe, unchanged. There are a few scattered ground-glass nodules measuring up to 8 mm in the right upper lobe, also stable. Distal perigastric lymph nodes are stable. No additional evidence of metastatic disease.        Problem List Patient Active Problem List   Diagnosis Date Noted  . Iron deficiency [E61.1] 02/18/2014  . Adenocarcinoma of transverse colon ([place]) [C18.4] 12/29/2013  . Mass of pancreas [K86.9] 12/08/2013    Past Medical History Past Medical History:  Diagnosis Date  . Arthritis    wrist and thumbs  . Colon cancer ([place])   .  PONV (postoperative nausea and vomiting)   . Ruptured lumbar disc    L1-L5 per patient report  . Sciatica of left side     Past Surgical History Past Surgical History:  Procedure Laterality Date  . COLONOSCOPY N/A 12/12/2013   Procedure: COLONOSCOPY;  Surgeon: Rogene Houston, MD;  [facility];  Service: Endoscopy;  Laterality: N/A;  730  . COLONOSCOPY N/A 01/20/2015   Procedure: COLONOSCOPY;  Surgeon: Rogene Houston, MD;  Location: [facility];  Service: Endoscopy;  Laterality: N/A;  1030  . EUS N/A 12/18/2013   Procedure: UPPER ENDOSCOPIC ULTRASOUND (EUS) LINEAR;  Surgeon: Milus Banister, MD;  Location: [facility];  Service: Endoscopy;  Laterality: N/A;  . herniated disc     1996-c5-c7  . PARTIAL COLECTOMY N/A 12/29/2013   Procedure: RIGHT HEMICOLECTOMY;  Surgeon: Jamesetta So, MD;  Location: [facility];  Service: General;  Laterality: N/A;  . PORT-A-CATH REMOVAL Right 04/01/2014   Procedure: MINOR REMOVAL PORT-A-CATH;  Surgeon: Jamesetta So, MD;  Location: [facility];  Service: General;  Laterality: Right;  . PORTACATH  PLACEMENT Right 02/04/2014   Procedure: INSERTION PORT-A-CATH;  Surgeon: Jamesetta So, MD;  Location: [facility];  Service: General;  Laterality: Right;    Family History Family History  Problem Relation Age of Onset  . Diabetes type II Mother   . Dementia Father      Social History  reports that she quit smoking about 58 years ago. Her smoking use included cigarettes. She has a 1.25 pack-year smoking history. She has never used smokeless tobacco. She reports that she does not drink alcohol or use drugs.  Medications  Current Outpatient Medications:  .  aspirin (ASPIRIN EC) 81 MG EC tablet, Take 81 mg by mouth daily. Swallow whole., Disp: , Rfl:  .  Bioflavonoid Products (ESTER C PO), Take 1,000 mg by mouth daily., Disp: , Rfl:  .  CRANBERRY PO, Take 1 tablet by mouth daily. , Disp: , Rfl:  .  DICLOFENAC POTASSIUM PO, Take 50 mg by mouth 2 (two) times  daily., Disp: , Rfl:  .  Garlic 0258 MG CAPS, Take 1,000 mg by mouth daily. , Disp: , Rfl:  .  Multiple Vitamin (MULTIVITAMIN) tablet, Take 1 tablet by mouth daily., Disp: , Rfl:  .  Omega-3 Fatty Acids (FISH OIL) 1200 MG CAPS, Take 1,000 mg by mouth daily. , Disp: , Rfl:   Allergies Sulfa antibiotics  Review of Systems Review of Systems - Oncology ROS as per HPI otherwise 12 point ROS is negative.   Physical Exam  Vitals Wt Readings from Last 3 Encounters:  11/05/17 167 lb (75.8 kg)  04/30/17 168 lb (76.2 kg)  10/26/16 170 lb (77.1 kg)   Temp Readings from Last 3 Encounters:  11/05/17 97.9 F (36.6 C) (Oral)  04/30/17 98.2 F (36.8 C) (Oral)  10/26/16 98.2 F (36.8 C) (Oral)   BP Readings from Last 3 Encounters:  11/05/17 (!) 180/77  04/30/17 (!) 167/70  10/26/16 (!) 164/64   Pulse Readings from Last 3 Encounters:  11/05/17 69  04/30/17 86  10/26/16 83    Constitutional: Well-developed, well-nourished, and in no distress.   HENT: Head: Normocephalic and atraumatic.  Mouth/Throat: No oropharyngeal exudate. Mucosa moist. Eyes: Pupils are equal, round, and reactive to light. Conjunctivae are normal. No scleral icterus.  Neck: Normal range of motion. Neck supple. No JVD present.  Cardiovascular: Normal rate, regular rhythm and normal heart sounds.  Exam reveals no gallop and no friction rub.   No murmur heard. Pulmonary/Chest: Effort normal and breath sounds normal. No respiratory distress. No wheezes.No rales.  Abdominal: Soft. Bowel sounds are normal. No distension. There is no tenderness. There is no guarding. Scar on abdomen noted, healed well.   Musculoskeletal: No edema or tenderness.  Lymphadenopathy: No cervical, axillary or supraclavicular adenopathy.  Neurological: Alert and oriented to person, place, and time. No cranial nerve deficit.  Skin: Skin is warm and dry. No rash noted. No erythema. No pallor.  Psychiatric: Affect and judgment normal.   Labs No  visits with results within 3 Day(s) from this visit.  Latest known visit with results is:  Appointment on 10/24/2017  Component Date Value Ref Range Status  . WBC 10/24/2017 7.6  4.0 - 10.5 K/uL Final  . RBC 10/24/2017 4.70  3.87 - 5.11 MIL/uL Final  . Hemoglobin 10/24/2017 14.2  12.0 - 15.0 g/dL Final  . HCT 10/24/2017 43.5  36.0 - 46.0 % Final  . MCV 10/24/2017 92.6  78.0 - 100.0 fL Final  . MCH 10/24/2017 30.2  26.0 -  34.0 pg Final  . MCHC 10/24/2017 32.6  30.0 - 36.0 g/dL Final  . RDW 10/24/2017 12.8  11.5 - 15.5 % Final  . Platelets 10/24/2017 182  150 - 400 K/uL Final  . Neutrophils Relative % 10/24/2017 60  % Final  . Neutro Abs 10/24/2017 4.6  1.7 - 7.7 K/uL Final  . Lymphocytes Relative 10/24/2017 29  % Final  . Lymphs Abs 10/24/2017 2.2  0.7 - 4.0 K/uL Final  . Monocytes Relative 10/24/2017 9  % Final  . Monocytes Absolute 10/24/2017 0.7  0.1 - 1.0 K/uL Final  . Eosinophils Relative 10/24/2017 2  % Final  . Eosinophils Absolute 10/24/2017 0.1  0.0 - 0.7 K/uL Final  . Basophils Relative 10/24/2017 0  % Final  . Basophils Absolute 10/24/2017 0.0  0.0 - 0.1 K/uL Final   Performed at [facility], [address], [place], [place] [address]  . Sodium 10/24/2017 138  135 - 145 mmol/L Final  . Potassium 10/24/2017 3.7  3.5 - 5.1 mmol/L Final  . Chloride 10/24/2017 100* 101 - 111 mmol/L Final  . CO2 10/24/2017 27  22 - 32 mmol/L Final  . Glucose, Bld 10/24/2017 101* 65 - 99 mg/dL Final  . BUN 10/24/2017 15  6 - 20 mg/dL Final  . Creatinine, Ser 10/24/2017 0.69  0.44 - 1.00 mg/dL Final  . Calcium 10/24/2017 10.2  8.9 - 10.3 mg/dL Final  . Total Protein 10/24/2017 7.9  6.5 - 8.1 g/dL Final  . Albumin 10/24/2017 4.3  3.5 - 5.0 g/dL Final  . AST 10/24/2017 23  15 - 41 U/L Final  . ALT 10/24/2017 20  14 - 54 U/L Final  . Alkaline Phosphatase 10/24/2017 64  38 - 126 U/L Final  . Total Bilirubin 10/24/2017 0.5  0.3 - 1.2 mg/dL Final  . GFR calc non Af Amer 10/24/2017 >60  >60 mL/min  Final  . GFR calc Af Amer 10/24/2017 >60  >60 mL/min Final   Comment: (NOTE) The eGFR has been calculated using the CKD EPI equation. This calculation has not been validated in all clinical situations. eGFR's persistently <60 mL/min signify possible Chronic Kidney Disease.   Georgiann Hahn gap 10/24/2017 11  5 - 15 Final   Performed at [facility], [address] [place], [place] [address]  . CEA 10/24/2017 3.7  0.0 - 4.7 ng/mL Final   Comment: (NOTE)                             Nonsmokers          <3.9                             Smokers             <5.6 Roche [employer] Electrochemiluminescence Immunoassay (ECLIA) Values obtained with different assay methods or kits cannot be used interchangeably.  Results cannot be interpreted as absolute evidence of the presence or absence of malignant disease. Performed At: [facility] [place], [place] [id number] Rush Farmer MD OV:[id number] Performed at [facility], [address] [place], [place] [address]      Pathology Orders Placed This Encounter  Procedures  . CT Chest W Contrast    Standing Status:   Future    Standing Expiration Date:   11/05/2018    Order Specific Question:   If indicated for the ordered procedure,  I authorize the administration of contrast media per Radiology protocol    Answer:   Yes    Order Specific Question:   Preferred imaging location?    Answer:   [facility]    Order Specific Question:   Radiology Contrast Protocol - do NOT remove file path    Answer:   \\[email or web address]    Order Specific Question:   Reason for Exam additional comments    Answer:   coloncance  . CT Abdomen Pelvis W Contrast    Standing Status:   Future    Standing Expiration Date:   11/05/2018    Order Specific Question:   If indicated for the ordered procedure, I authorize the administration of contrast media per Radiology protocol    Answer:   Yes    Order Specific Question:    Preferred imaging location?    Answer:   [facility]    Order Specific Question:   Is Oral Contrast requested for this exam?    Answer:   Per Radiology protocol    Order Specific Question:   Radiology Contrast Protocol - do NOT remove file path    Answer:   \\[email or web address]    Order Specific Question:   Reason for Exam additional comments    Answer:   colon cancer  . CBC with Differential/Platelet    Standing Status:   Future    Standing Expiration Date:   11/06/2018  . Comprehensive metabolic panel    Standing Status:   Future    Standing Expiration Date:   11/06/2018  . Lactate dehydrogenase    Standing Status:   Future    Standing Expiration Date:   11/06/2018  . CEA    Standing Status:   Future    Standing Expiration Date:   11/05/2018       Zoila Shutter MD

## 2017-11-08 ENCOUNTER — Encounter (INDEPENDENT_AMBULATORY_CARE_PROVIDER_SITE_OTHER): Payer: Self-pay | Admitting: *Deleted

## 2017-11-08 ENCOUNTER — Telehealth (HOSPITAL_COMMUNITY): Payer: Self-pay

## 2017-11-08 DIAGNOSIS — C184 Malignant neoplasm of transverse colon: Secondary | ICD-10-CM

## 2017-11-08 NOTE — Telephone Encounter (Signed)
Patient called stating she needed referral to Dr. Laural Golden for her repeat colonoscopy. Reviewed with Dr. Walden Field. Referral entered and message sent to scheduling. Also notified patient who verbalized understanding.

## 2018-01-11 ENCOUNTER — Encounter (INDEPENDENT_AMBULATORY_CARE_PROVIDER_SITE_OTHER): Payer: Self-pay | Admitting: *Deleted

## 2018-01-14 DIAGNOSIS — Z1389 Encounter for screening for other disorder: Secondary | ICD-10-CM | POA: Diagnosis not present

## 2018-01-14 DIAGNOSIS — Z0001 Encounter for general adult medical examination with abnormal findings: Secondary | ICD-10-CM | POA: Diagnosis not present

## 2018-01-14 DIAGNOSIS — Z6829 Body mass index (BMI) 29.0-29.9, adult: Secondary | ICD-10-CM | POA: Diagnosis not present

## 2018-01-25 ENCOUNTER — Other Ambulatory Visit (INDEPENDENT_AMBULATORY_CARE_PROVIDER_SITE_OTHER): Payer: Self-pay | Admitting: *Deleted

## 2018-01-25 DIAGNOSIS — Z85038 Personal history of other malignant neoplasm of large intestine: Secondary | ICD-10-CM

## 2018-02-13 ENCOUNTER — Encounter (INDEPENDENT_AMBULATORY_CARE_PROVIDER_SITE_OTHER): Payer: Self-pay | Admitting: *Deleted

## 2018-02-13 ENCOUNTER — Telehealth (INDEPENDENT_AMBULATORY_CARE_PROVIDER_SITE_OTHER): Payer: Self-pay | Admitting: *Deleted

## 2018-02-13 MED ORDER — SUPREP BOWEL PREP KIT 17.5-3.13-1.6 GM/177ML PO SOLN: 1.0000 | Freq: Once | ORAL | 0 refills | Status: AC

## 2018-02-13 NOTE — Telephone Encounter (Signed)
Patient needs suprep 

## 2018-02-25 ENCOUNTER — Telehealth (INDEPENDENT_AMBULATORY_CARE_PROVIDER_SITE_OTHER): Payer: Self-pay | Admitting: *Deleted

## 2018-02-25 NOTE — Telephone Encounter (Signed)
Referring MD/PCP: fusco   Procedure: tcs  Reason/Indication:  Hx colon ca  Has patient had this procedure before?  Yes, 2016  If so, when, by whom and where?    Is there a family history of colon cancer?  no  Who?  What age when diagnosed?    Is patient diabetic?   no      Does patient have prosthetic heart valve or mechanical valve?  no  Do you have a pacemaker?  no  Has patient ever had endocarditis? no  Has patient had joint replacement within last 12 months?  no  Is patient constipated or do they take laxatives? no  Does patient have a history of alcohol/drug use?  no  Is patient on blood thinner such as Coumadin, Plavix and/or Aspirin? no  Medications: acetaminophen prn  Allergies: sulfur drugs  Medication Adjustment per Dr Lindi Adie, NP:   Procedure date & time: 03/28/18 at 1015

## 2018-02-26 NOTE — Telephone Encounter (Signed)
agree

## 2018-03-14 DIAGNOSIS — M7062 Trochanteric bursitis, left hip: Secondary | ICD-10-CM | POA: Diagnosis not present

## 2018-03-14 DIAGNOSIS — M5416 Radiculopathy, lumbar region: Secondary | ICD-10-CM | POA: Diagnosis not present

## 2018-03-14 DIAGNOSIS — M48062 Spinal stenosis, lumbar region with neurogenic claudication: Secondary | ICD-10-CM | POA: Diagnosis not present

## 2018-03-26 DIAGNOSIS — M5416 Radiculopathy, lumbar region: Secondary | ICD-10-CM | POA: Diagnosis not present

## 2018-03-28 ENCOUNTER — Ambulatory Visit (HOSPITAL_COMMUNITY)
Admission: RE | Admit: 2018-03-28 | Discharge: 2018-03-28 | Disposition: A | Discharge status: Home / Self Care | Payer: Medicare Other | Source: Ambulatory Visit | Attending: Internal Medicine | Admitting: Internal Medicine

## 2018-03-28 ENCOUNTER — Other Ambulatory Visit: Payer: Self-pay

## 2018-03-28 ENCOUNTER — Encounter (HOSPITAL_COMMUNITY): Payer: Self-pay | Admitting: *Deleted

## 2018-03-28 ENCOUNTER — Encounter (HOSPITAL_COMMUNITY)
Admission: RE | Disposition: A | Discharge status: Home / Self Care | Payer: Self-pay | Source: Ambulatory Visit | Attending: Internal Medicine

## 2018-03-28 DIAGNOSIS — Z980 Intestinal bypass and anastomosis status: Secondary | ICD-10-CM | POA: Diagnosis not present

## 2018-03-28 DIAGNOSIS — Z1211 Encounter for screening for malignant neoplasm of colon: Secondary | ICD-10-CM | POA: Diagnosis not present

## 2018-03-28 DIAGNOSIS — M1990 Unspecified osteoarthritis, unspecified site: Secondary | ICD-10-CM | POA: Insufficient documentation

## 2018-03-28 DIAGNOSIS — Z882 Allergy status to sulfonamides status: Secondary | ICD-10-CM | POA: Diagnosis not present

## 2018-03-28 DIAGNOSIS — K635 Polyp of colon: Secondary | ICD-10-CM | POA: Diagnosis not present

## 2018-03-28 DIAGNOSIS — Z08 Encounter for follow-up examination after completed treatment for malignant neoplasm: Secondary | ICD-10-CM | POA: Diagnosis not present

## 2018-03-28 DIAGNOSIS — Z85038 Personal history of other malignant neoplasm of large intestine: Secondary | ICD-10-CM | POA: Diagnosis not present

## 2018-03-28 DIAGNOSIS — K644 Residual hemorrhoidal skin tags: Secondary | ICD-10-CM | POA: Diagnosis not present

## 2018-03-28 DIAGNOSIS — Z9221 Personal history of antineoplastic chemotherapy: Secondary | ICD-10-CM | POA: Insufficient documentation

## 2018-03-28 DIAGNOSIS — Z87891 Personal history of nicotine dependence: Secondary | ICD-10-CM | POA: Diagnosis not present

## 2018-03-28 DIAGNOSIS — D123 Benign neoplasm of transverse colon: Secondary | ICD-10-CM | POA: Insufficient documentation

## 2018-03-28 DIAGNOSIS — K5730 Diverticulosis of large intestine without perforation or abscess without bleeding: Secondary | ICD-10-CM | POA: Diagnosis not present

## 2018-03-28 DIAGNOSIS — Z7982 Long term (current) use of aspirin: Secondary | ICD-10-CM | POA: Diagnosis not present

## 2018-03-28 DIAGNOSIS — Z9049 Acquired absence of other specified parts of digestive tract: Secondary | ICD-10-CM | POA: Diagnosis not present

## 2018-03-28 DIAGNOSIS — C189 Malignant neoplasm of colon, unspecified: Secondary | ICD-10-CM

## 2018-03-28 HISTORY — PX: POLYPECTOMY: SHX5525

## 2018-03-28 HISTORY — PX: COLONOSCOPY: SHX5424

## 2018-03-28 SURGERY — COLONOSCOPY
Anesthesia: Moderate Sedation

## 2018-03-28 MED ORDER — STERILE WATER FOR IRRIGATION IR SOLN
Status: DC | PRN
  Administered 2018-03-28: 1.5 mL

## 2018-03-28 MED ORDER — MIDAZOLAM HCL 5 MG/5ML IJ SOLN
INTRAMUSCULAR | Status: AC
  Filled 2018-03-28: qty 10

## 2018-03-28 MED ORDER — SODIUM CHLORIDE 0.9 % IV SOLN
INTRAVENOUS | Status: DC
  Administered 2018-03-28: 10:00:00 via INTRAVENOUS

## 2018-03-28 MED ORDER — MEPERIDINE HCL 50 MG/ML IJ SOLN
INTRAMUSCULAR | Status: DC | PRN
  Administered 2018-03-28 (×2): 25 mg via INTRAVENOUS

## 2018-03-28 MED ORDER — MEPERIDINE HCL 50 MG/ML IJ SOLN
INTRAMUSCULAR | Status: AC
  Filled 2018-03-28: qty 1

## 2018-03-28 MED ORDER — MIDAZOLAM HCL 5 MG/5ML IJ SOLN
INTRAMUSCULAR | Status: DC | PRN
  Administered 2018-03-28 (×4): 2 mg via INTRAVENOUS

## 2018-03-28 NOTE — Op Note (Signed)
[facility] Patient Name: Amanda Norris Procedure Date: 03/28/2018 11:13 AM MRN: [id number] Date of Birth: January 18, 1946 Attending MD: Hildred Laser , MD CSN: [id number] Age: 72 Admit Type: Outpatient Procedure:                Colonoscopy Indications:              High risk colon cancer surveillance: Personal                            history of colon cancer Providers:                Hildred Laser, MD, Lurline Del, RN, Aram Candela Referring MD:             Redmond School, MD Medicines:                Meperidine 50 mg IV, Midazolam 8 mg IV Complications:            No immediate complications. Estimated Blood Loss:     Estimated blood loss: none. Procedure:                Pre-Anesthesia Assessment:                           - Prior to the procedure, a History and Physical                            was performed, and patient medications and                            allergies were reviewed. The patient's tolerance of                            previous anesthesia was also reviewed. The risks                            and benefits of the procedure and the sedation                            options and risks were discussed with the patient.                            All questions were answered, and informed consent                            was obtained. Prior Anticoagulants: The patient has                            taken no previous anticoagulant or antiplatelet                            agents. ASA Grade Assessment: I - A normal, healthy                            patient. After reviewing the risks and benefits,  the patient was deemed in satisfactory condition to                            undergo the procedure.                           After obtaining informed consent, the colonoscope                            was passed under direct vision. Throughout the                            procedure, the patient's blood pressure, pulse, and                             oxygen saturations were monitored continuously. The                            [id number]-[id number] ([id number]) scope was introduced through                            the anus and advanced to the the ileocolonic                            anastomosis. The colonoscopy was performed without                            difficulty. The patient tolerated the procedure                            well. The quality of the bowel preparation was                            good. The terminal ileum and the rectum were                            photographed. Scope In: 11:37:48 AM Scope Out: 11:53:36 AM Scope Withdrawal Time: 0 hours 8 minutes 55 seconds  Total Procedure Duration: 0 hours 15 minutes 48 seconds  Findings:      Skin tags were found on perianal exam.      The terminal ileum appeared normal.      There was evidence of a prior end-to-side ileo-colonic anastomosis in       the mid transverse colon. This was patent and was characterized by       healthy appearing mucosa. The anastomosis was traversed.      A 8 mm polyp was found in the transverse colon. The polyp was sessile.       The polyp was removed with a hot snare. Resection and retrieval were       complete.      A few diverticula were found in the sigmoid colon, descending colon,       splenic flexure and transverse colon.      External hemorrhoids were found during retroflexion. The hemorrhoids       were small. Impression:               -  Perianal skin tags found on perianal exam.                           - The examined portion of the ileum was normal.                           - Patent end-to-side ileo-colonic anastomosis,                            characterized by healthy appearing mucosa.                           - One 8 mm polyp in the transverse colon, removed                            with a hot snare. Resected and retrieved.                           - Diverticulosis in the sigmoid colon, in the                             descending colon, at the splenic flexure and in the                            transverse colon.                           - External hemorrhoids. Moderate Sedation:      Moderate (conscious) sedation was administered by the endoscopy nurse       and supervised by the endoscopist. The following parameters were       monitored: oxygen saturation, heart rate, blood pressure, CO2       capnography and response to care. Total physician intraservice time was       23 minutes. Recommendation:           - Patient has a contact number available for                            emergencies. The signs and symptoms of potential                            delayed complications were discussed with the                            patient. Return to normal activities tomorrow.                            Written discharge instructions were provided to the                            patient.                           - Resume previous diet today.                           -  Continue present medications.                           - No aspirin, ibuprofen, naproxen, or other                            non-steroidal anti-inflammatory drugs for 7 days                            after polyp removal.                           - Await pathology results.                           - Repeat colonoscopy in 5 years for surveillance. Procedure Code(s):        --- Professional ---                           [phone], Colonoscopy, flexible; with removal of                            tumor(s), polyp(s), or other lesion(s) by snare                            technique                           G0500, Moderate sedation services provided by the                            same physician or other qualified health care                            professional performing a gastrointestinal                            endoscopic service that sedation supports,                            requiring the presence of an independent  trained                            observer to assist in the monitoring of the                            patient's level of consciousness and physiological                            status; initial 15 minutes of intra-service time;                            patient age 2 years or older (additional time may                            be  reported with [phone], as appropriate)                           [phone], Moderate sedation services provided by the                            same physician or other qualified health care                            professional performing the diagnostic or                            therapeutic service that the sedation supports,                            requiring the presence of an independent trained                            observer to assist in the monitoring of the                            patient's level of consciousness and physiological                            status; each additional 15 minutes intraservice                            time (List separately in addition to code for                            primary service) Diagnosis Code(s):        --- Professional ---                           K64.4, Residual hemorrhoidal skin tags                           Z85.038, Personal history of other malignant                            neoplasm of large intestine                           Z98.0, Intestinal bypass and anastomosis status                           D12.3, Benign neoplasm of transverse colon (hepatic                            flexure or splenic flexure)                           K57.30, Diverticulosis of large intestine without                            perforation or abscess  without bleeding CPT copyright 2017 American Medical Association. All rights reserved. The codes documented in this report are preliminary and upon coder review may  be revised to meet current compliance requirements. Hildred Laser, MD Hildred Laser,  MD 03/28/2018 12:01:27 PM This report has been signed electronically. Number of Addenda: 0

## 2018-03-28 NOTE — H&P (Signed)
Amanda Norris is an 72 y.o. female.   Chief Complaint: Patient is here for colonoscopy. HPI: 70-year-old Caucasian female who has history of colon carcinoma and is here for surveillance colonoscopy.  She was found to have carcinoma in the proximal transverse colon and underwent right hemicolectomy in 2015.  She did not finish chemotherapy.  Last colonoscopy was in July 2016 and no abnormality was noted other than diverticulosis and hemorrhoids.  She has intermittent diarrhea.  Her stool is always formed.  She denies abdominal pain or rectal bleeding. Her CEA has remained normal. Last chest and abdominal pelvic CT was in April 2019 revealing stable pulmonary nodules. Family history is negative for CRC.  Past Medical History:  Diagnosis Date  . Arthritis    wrist and thumbs  . Colon cancer (Amanda Norris)   . PONV (postoperative nausea and vomiting)   . Ruptured lumbar disc    L1-L5 per patient report  . Sciatica of left side     Past Surgical History:  Procedure Laterality Date  . COLONOSCOPY N/A 12/12/2013   Procedure: COLONOSCOPY;  Surgeon: Amanda Houston, MD;  Location: [facility];  Service: Endoscopy;  Laterality: N/A;  730  . COLONOSCOPY N/A 01/20/2015   Procedure: COLONOSCOPY;  Surgeon: Amanda Houston, MD;  Location: [facility];  Service: Endoscopy;  Laterality: N/A;  1030  . EUS N/A 12/18/2013   Procedure: UPPER ENDOSCOPIC ULTRASOUND (EUS) LINEAR;  Surgeon: Amanda Banister, MD;  Location: [facility];  Service: Endoscopy;  Laterality: N/A;  . herniated disc     [id number]  . PARTIAL COLECTOMY N/A 12/29/2013   Procedure: RIGHT HEMICOLECTOMY;  Surgeon: Amanda So, MD;  Location: [facility];  Service: General;  Laterality: N/A;  . PORT-A-CATH REMOVAL Right 04/01/2014   Procedure: MINOR REMOVAL PORT-A-CATH;  Surgeon: Amanda So, MD;  Location: [facility];  Service: General;  Laterality: Right;  . PORTACATH PLACEMENT Right 02/04/2014   Procedure: INSERTION PORT-A-CATH;  Surgeon: Amanda So, MD;  Location: [facility];  Service: General;  Laterality: Right;    Family History  Problem Relation Age of Onset  . Diabetes type II Mother   . Dementia Father    Social History:  reports that she quit smoking about 58 years ago. Her smoking use included cigarettes. She has a 1.25 pack-year smoking history. She has never used smokeless tobacco. She reports that she does not drink alcohol or use drugs.  Allergies:  Allergies  Allergen Reactions  . Sulfa Antibiotics Other (See Comments)    "Sores in my mouth"    Medications Prior to Admission  Medication Sig Dispense Refill  . acetaminophen (TYLENOL) 500 MG tablet Take 500 mg by mouth every 6 (six) hours as needed for moderate pain or headache.    Marland Kitchen aspirin (ASPIRIN EC) 81 MG EC tablet Take 81 mg by mouth daily. Swallow whole.    Marland Kitchen Bioflavonoid Products (ESTER C PO) Take 1,000 mg by mouth daily.    . Garlic 3151 MG CAPS Take 1,000 mg by mouth daily.     . Multiple Vitamin (MULTIVITAMIN) tablet Take 1 tablet by mouth daily.    . Omega-3 Fatty Acids (FISH OIL) 1200 MG CAPS Take 1,200 mg by mouth daily.       No results found for this or any previous visit (from the past 48 hour(s)). No results found.  ROS  Blood pressure (!) 155/59, temperature 98.1 F (36.7 C), temperature source Oral, resp. rate 15, height 5'  3.5" (1.613 m), weight 76.2 kg, SpO2 99 %. Physical Exam  Constitutional: She appears well-developed and well-nourished.  HENT:  Mouth/Throat: Oropharynx is clear and moist.  Eyes: Conjunctivae are normal. No scleral icterus.  Neck: No thyromegaly present.  Cardiovascular: Normal rate, regular rhythm and normal heart sounds.  No murmur heard. Respiratory: Effort normal and breath sounds normal.  Scar below lateral end of right clavicle  GI:  Right paramedian scar.  Abdomen is soft and nontender with organomegaly or masses.  Musculoskeletal: She exhibits no edema.  Lymphadenopathy:    She has no cervical  adenopathy.  Neurological: She is alert.  Skin: Skin is warm and dry.     Assessment/Plan History of colon carcinoma. Surveillance colonoscopy.  Amanda Laser, MD 03/28/2018, 11:25 AM

## 2018-03-28 NOTE — Discharge Instructions (Addendum)
No aspirin or NSAIDs for 1 week. Resume other medications as before. We will start him in 2 to 4 g by mouth daily or as needed. Resume usual diet. No driving for 24 hours. Physician will call with biopsy results.   Colonoscopy, Adult, Care After This sheet gives you information about how to care for yourself after your procedure. Your health care provider may also give you more specific instructions. If you have problems or questions, contact your health care provider. What can I expect after the procedure? After the procedure, it is common to have:  A small amount of blood in your stool for 24 hours after the procedure.  Some gas.  Mild abdominal cramping or bloating.  Follow these instructions at home: General instructions   For the first 24 hours after the procedure: ? Do not drive or use machinery. ? Do not sign important documents. ? Do not drink alcohol. ? Do your regular daily activities at a slower pace than normal. ? Eat soft, easy-to-digest foods. ? Rest often.  Take over-the-counter or prescription medicines only as told by your health care provider.  It is up to you to get the results of your procedure. Ask your health care provider, or the department performing the procedure, when your results will be ready. Relieving cramping and bloating  Try walking around when you have cramps or feel bloated.  Apply heat to your abdomen as told by your health care provider. Use a heat source that your health care provider recommends, such as a moist heat pack or a heating pad. ? Place a towel between your skin and the heat source. ? Leave the heat on for 20-30 minutes. ? Remove the heat if your skin turns bright red. This is especially important if you are unable to feel pain, heat, or cold. You may have a greater risk of getting burned. Eating and drinking  Drink enough fluid to keep your urine clear or pale yellow.  Resume your normal diet as instructed by your health  care provider. Avoid heavy or fried foods that are hard to digest.  Avoid drinking alcohol for as long as instructed by your health care provider. Contact a health care provider if:  You have blood in your stool 2-3 days after the procedure. Get help right away if:  You have more than a small spotting of blood in your stool.  You pass large blood clots in your stool.  Your abdomen is swollen.  You have nausea or vomiting.  You have a fever.  You have increasing abdominal pain that is not relieved with medicine. This information is not intended to replace advice given to you by your health care provider. Make sure you discuss any questions you have with your health care provider.  Colonoscopy, Adult A colonoscopy is an exam to look at the entire large intestine. During the exam, a lubricated, bendable tube is inserted into the anus and then passed into the rectum, colon, and other parts of the large intestine. A colonoscopy is often done as a part of normal colorectal screening or in response to certain symptoms, such as anemia, persistent diarrhea, abdominal pain, and blood in the stool. The exam can help screen for and diagnose medical problems, including:  Tumors.  Polyps.  Inflammation.  Areas of bleeding.  Tell a health care provider about:  Any allergies you have.  All medicines you are taking, including vitamins, herbs, eye drops, creams, and over-the-counter medicines.  Any problems you or  family members have had with anesthetic medicines.  Any blood disorders you have.  Any surgeries you have had.  Any medical conditions you have.  Any problems you have had passing stool. What are the risks? Generally, this is a safe procedure. However, problems may occur, including:  Bleeding.  A tear in the intestine.  A reaction to medicines given during the exam.  Infection (rare).  What happens before the procedure? Eating and drinking restrictions Follow  instructions from your health care provider about eating and drinking, which may include:  A few days before the procedure - follow a low-fiber diet. Avoid nuts, seeds, dried fruit, raw fruits, and vegetables.  1-3 days before the procedure - follow a clear liquid diet. Drink only clear liquids, such as clear broth or bouillon, black coffee or tea, clear juice, clear soft drinks or sports drinks, gelatin dessert, and popsicles. Avoid any liquids that contain red or purple dye.  On the day of the procedure - do not eat or drink anything during the 2 hours before the procedure, or within the time period that your health care provider recommends.  Bowel prep If you were prescribed an oral bowel prep to clean out your colon:  Take it as told by your health care provider. Starting the day before your procedure, you will need to drink a large amount of medicated liquid. The liquid will cause you to have multiple loose stools until your stool is almost clear or light green.  If your skin or anus gets irritated from diarrhea, you may use these to relieve the irritation: ? Medicated wipes, such as adult wet wipes with aloe and vitamin E. ? A skin soothing-product like petroleum jelly.  If you vomit while drinking the bowel prep, take a break for up to 60 minutes and then begin the bowel prep again. If vomiting continues and you cannot take the bowel prep without vomiting, call your health care provider.  General instructions  Ask your health care provider about changing or stopping your regular medicines. This is especially important if you are taking diabetes medicines or blood thinners.  Plan to have someone take you home from the hospital or clinic. What happens during the procedure?  An IV tube may be inserted into one of your veins.  You will be given medicine to help you relax (sedative).  To reduce your risk of infection: ? Your health care team will wash or sanitize their hands. ? Your  anal area will be washed with soap.  You will be asked to lie on your side with your knees bent.  Your health care provider will lubricate a long, thin, flexible tube. The tube will have a camera and a light on the end.  The tube will be inserted into your anus.  The tube will be gently eased through your rectum and colon.  Air will be delivered into your colon to keep it open. You may feel some pressure or cramping.  The camera will be used to take images during the procedure.  A small tissue sample may be removed from your body to be examined under a microscope (biopsy). If any potential problems are found, the tissue will be sent to a lab for testing.  If small polyps are found, your health care provider may remove them and have them checked for cancer cells.  The tube that was inserted into your anus will be slowly removed. The procedure may vary among health care providers and hospitals. What  happens after the procedure?  Your blood pressure, heart rate, breathing rate, and blood oxygen level will be monitored until the medicines you were given have worn off.  Do not drive for 24 hours after the exam.  You may have a small amount of blood in your stool.  You may pass gas and have mild abdominal cramping or bloating due to the air that was used to inflate your colon during the exam.  It is up to you to get the results of your procedure. Ask your health care provider, or the department performing the procedure, when your results will be ready. This information is not intended to replace advice given to you by your health care provider. Make sure you discuss any questions you have with your health care provider.   Colon Polyps Polyps are tissue growths inside the body. Polyps can grow in many places, including the large intestine (colon). A polyp may be a round bump or a mushroom-shaped growth. You could have one polyp or several. Most colon polyps are noncancerous (benign).  However, some colon polyps can become cancerous over time. What are the causes? The exact cause of colon polyps is not known. What increases the risk? This condition is more likely to develop in people who:  Have a family history of colon cancer or colon polyps.  Are older than 40 or older than 45 if they are African American.  Have inflammatory bowel disease, such as ulcerative colitis or Crohn disease.  Are overweight.  Smoke cigarettes.  Do not get enough exercise.  Drink too much alcohol.  Eat a diet that is: ? High in fat and red meat. ? Low in fiber.  Had childhood cancer that was treated with abdominal radiation.  What are the signs or symptoms? Most polyps do not cause symptoms. If you have symptoms, they may include:  Blood coming from your rectum when having a bowel movement.  Blood in your stool.The stool may look dark red or black.  A change in bowel habits, such as constipation or diarrhea.  How is this diagnosed? This condition is diagnosed with a colonoscopy. This is a procedure that uses a lighted, flexible scope to look at the inside of your colon. How is this treated? Treatment for this condition involves removing any polyps that are found. Those polyps will then be tested for cancer. If cancer is found, your health care provider will talk to you about options for colon cancer treatment. Follow these instructions at home: Diet  Eat plenty of fiber, such as fruits, vegetables, and whole grains.  Eat foods that are high in calcium and vitamin D, such as milk, cheese, yogurt, eggs, liver, fish, and broccoli.  Limit foods high in fat, red meats, and processed meats, such as hot dogs, sausage, bacon, and lunch meats.  Maintain a healthy weight, or lose weight if recommended by your health care provider. General instructions  Do not smoke cigarettes.  Do not drink alcohol excessively.  Keep all follow-up visits as told by your health care provider.  This is important. This includes keeping regularly scheduled colonoscopies. Talk to your health care provider about when you need a colonoscopy.  Exercise every day or as told by your health care provider. Contact a health care provider if:  You have new or worsening bleeding during a bowel movement.  You have new or increased blood in your stool.  You have a change in bowel habits.  You unexpectedly lose weight. This information is  not intended to replace advice given to you by your health care provider. Make sure you discuss any questions you have with your health care provider.   Diverticulosis Diverticulosis is a condition that develops when small pouches (diverticula) form in the wall of the large intestine (colon). The colon is where water is absorbed and stool is formed. The pouches form when the inside layer of the colon pushes through weak spots in the outer layers of the colon. You may have a few pouches or many of them. What are the causes? The cause of this condition is not known. What increases the risk? The following factors may make you more likely to develop this condition:  Being older than age 10. Your risk for this condition increases with age. Diverticulosis is rare among people younger than age 68. By age 28, many people have it.  Eating a low-fiber diet.  Having frequent constipation.  Being overweight.  Not getting enough exercise.  Smoking.  Taking over-the-counter pain medicines, like aspirin and ibuprofen.  Having a family history of diverticulosis.  What are the signs or symptoms? In most people, there are no symptoms of this condition. If you do have symptoms, they may include:  Bloating.  Cramps in the abdomen.  Constipation or diarrhea.  Pain in the lower left side of the abdomen.  How is this diagnosed? This condition is most often diagnosed during an exam for other colon problems. Because diverticulosis usually has no symptoms, it  often cannot be diagnosed independently. This condition may be diagnosed by:  Using a flexible scope to examine the colon (colonoscopy).  Taking an X-ray of the colon after dye has been put into the colon (barium enema).  Doing a CT scan.  How is this treated? You may not need treatment for this condition if you have never developed an infection related to diverticulosis. If you have had an infection before, treatment may include:  Eating a high-fiber diet. This may include eating more fruits, vegetables, and grains.  Taking a fiber supplement.  Taking a live bacteria supplement (probiotic).  Taking medicine to relax your colon.  Taking antibiotic medicines.  Follow these instructions at home:  Drink 6-8 glasses of water or more each day to prevent constipation.  Try not to strain when you have a bowel movement.  If you have had an infection before: ? Eat more fiber as directed by your health care provider or your diet and nutrition specialist (dietitian). ? Take a fiber supplement or probiotic, if your health care provider approves.  Take over-the-counter and prescription medicines only as told by your health care provider.  If you were prescribed an antibiotic, take it as told by your health care provider. Do not stop taking the antibiotic even if you start to feel better.  Keep all follow-up visits as told by your health care provider. This is important. Contact a health care provider if:  You have pain in your abdomen.  You have bloating.  You have cramps.  You have not had a bowel movement in 3 days. Get help right away if:  Your pain gets worse.  Your bloating becomes very bad.  You have a fever or chills, and your symptoms suddenly get worse.  You vomit.  You have bowel movements that are bloody or black.  You have bleeding from your rectum. Summary  Diverticulosis is a condition that develops when small pouches (diverticula) form in the wall of the  large intestine (colon).  You  may have a few pouches or many of them.  This condition is most often diagnosed during an exam for other colon problems.  If you have had an infection related to diverticulosis, treatment may include increasing the fiber in your diet, taking supplements, or taking medicines. This information is not intended to replace advice given to you by your health care provider. Make sure you discuss any questions you have with your health care provider.

## 2018-04-01 DIAGNOSIS — M48062 Spinal stenosis, lumbar region with neurogenic claudication: Secondary | ICD-10-CM | POA: Diagnosis not present

## 2018-04-01 DIAGNOSIS — M5416 Radiculopathy, lumbar region: Secondary | ICD-10-CM | POA: Diagnosis not present

## 2018-04-02 ENCOUNTER — Encounter (HOSPITAL_COMMUNITY): Payer: Self-pay | Admitting: Internal Medicine

## 2018-04-18 DIAGNOSIS — N390 Urinary tract infection, site not specified: Secondary | ICD-10-CM | POA: Diagnosis not present

## 2018-04-25 ENCOUNTER — Inpatient Hospital Stay (HOSPITAL_COMMUNITY): Payer: Medicare Other | Attending: Hematology

## 2018-04-25 DIAGNOSIS — Z85038 Personal history of other malignant neoplasm of large intestine: Secondary | ICD-10-CM | POA: Insufficient documentation

## 2018-04-25 DIAGNOSIS — R918 Other nonspecific abnormal finding of lung field: Secondary | ICD-10-CM | POA: Diagnosis not present

## 2018-04-25 DIAGNOSIS — Z9221 Personal history of antineoplastic chemotherapy: Secondary | ICD-10-CM | POA: Insufficient documentation

## 2018-04-25 DIAGNOSIS — R197 Diarrhea, unspecified: Secondary | ICD-10-CM | POA: Insufficient documentation

## 2018-04-25 DIAGNOSIS — Z87891 Personal history of nicotine dependence: Secondary | ICD-10-CM | POA: Diagnosis not present

## 2018-04-25 DIAGNOSIS — K5900 Constipation, unspecified: Secondary | ICD-10-CM | POA: Diagnosis not present

## 2018-04-25 DIAGNOSIS — D509 Iron deficiency anemia, unspecified: Secondary | ICD-10-CM | POA: Diagnosis not present

## 2018-04-25 DIAGNOSIS — C184 Malignant neoplasm of transverse colon: Secondary | ICD-10-CM

## 2018-04-25 LAB — COMPREHENSIVE METABOLIC PANEL
ALT: 15 U/L (ref 0–44)
ANION GAP: 9 (ref 5–15)
AST: 20 U/L (ref 15–41)
Albumin: 4 g/dL (ref 3.5–5.0)
Alkaline Phosphatase: 53 U/L (ref 38–126)
BUN: 13 mg/dL (ref 8–23)
CHLORIDE: 104 mmol/L (ref 98–111)
CO2: 27 mmol/L (ref 22–32)
Calcium: 9.4 mg/dL (ref 8.9–10.3)
Creatinine, Ser: 0.79 mg/dL (ref 0.44–1.00)
Glucose, Bld: 109 mg/dL — ABNORMAL HIGH (ref 70–99)
Potassium: 3.9 mmol/L (ref 3.5–5.1)
Sodium: 140 mmol/L (ref 135–145)
Total Bilirubin: 0.6 mg/dL (ref 0.3–1.2)
Total Protein: 7.4 g/dL (ref 6.5–8.1)

## 2018-04-25 LAB — CBC WITH DIFFERENTIAL/PLATELET
Abs Immature Granulocytes: 0.02 10*3/uL (ref 0.00–0.07)
BASOS PCT: 1 %
Basophils Absolute: 0 10*3/uL (ref 0.0–0.1)
EOS ABS: 0.1 10*3/uL (ref 0.0–0.5)
Eosinophils Relative: 1 %
HCT: 43.9 % (ref 36.0–46.0)
Hemoglobin: 13.8 g/dL (ref 12.0–15.0)
IMMATURE GRANULOCYTES: 0 %
Lymphocytes Relative: 26 %
Lymphs Abs: 1.8 10*3/uL (ref 0.7–4.0)
MCH: 29.4 pg (ref 26.0–34.0)
MCHC: 31.4 g/dL (ref 30.0–36.0)
MCV: 93.6 fL (ref 80.0–100.0)
MONOS PCT: 8 %
Monocytes Absolute: 0.6 10*3/uL (ref 0.1–1.0)
Neutro Abs: 4.3 10*3/uL (ref 1.7–7.7)
Neutrophils Relative %: 64 %
PLATELETS: 207 10*3/uL (ref 150–400)
RBC: 4.69 MIL/uL (ref 3.87–5.11)
RDW: 13.2 % (ref 11.5–15.5)
WBC: 6.8 10*3/uL (ref 4.0–10.5)
nRBC: 0 % (ref 0.0–0.2)

## 2018-04-25 LAB — LACTATE DEHYDROGENASE: LDH: 153 U/L (ref 98–192)

## 2018-04-26 LAB — CEA: CEA: 4.3 ng/mL (ref 0.0–4.7)

## 2018-04-29 DIAGNOSIS — M7918 Myalgia, other site: Secondary | ICD-10-CM | POA: Diagnosis not present

## 2018-04-29 DIAGNOSIS — M7062 Trochanteric bursitis, left hip: Secondary | ICD-10-CM | POA: Diagnosis not present

## 2018-04-29 DIAGNOSIS — M5416 Radiculopathy, lumbar region: Secondary | ICD-10-CM | POA: Diagnosis not present

## 2018-05-01 ENCOUNTER — Ambulatory Visit (HOSPITAL_COMMUNITY)
Admission: RE | Admit: 2018-05-01 | Discharge: 2018-05-01 | Disposition: A | Discharge status: Home / Self Care | Payer: Medicare Other | Source: Ambulatory Visit | Attending: Internal Medicine | Admitting: Internal Medicine

## 2018-05-01 DIAGNOSIS — K429 Umbilical hernia without obstruction or gangrene: Secondary | ICD-10-CM | POA: Insufficient documentation

## 2018-05-01 DIAGNOSIS — K5730 Diverticulosis of large intestine without perforation or abscess without bleeding: Secondary | ICD-10-CM | POA: Insufficient documentation

## 2018-05-01 DIAGNOSIS — C184 Malignant neoplasm of transverse colon: Secondary | ICD-10-CM | POA: Diagnosis not present

## 2018-05-01 DIAGNOSIS — R918 Other nonspecific abnormal finding of lung field: Secondary | ICD-10-CM | POA: Diagnosis not present

## 2018-05-01 DIAGNOSIS — C189 Malignant neoplasm of colon, unspecified: Secondary | ICD-10-CM | POA: Insufficient documentation

## 2018-05-01 MED ORDER — IOPAMIDOL (ISOVUE-300) INJECTION 61%
100.0000 mL | Freq: Once | INTRAVENOUS | Status: AC | PRN
  Administered 2018-05-01: 100 mL via INTRAVENOUS

## 2018-05-07 ENCOUNTER — Other Ambulatory Visit: Payer: Self-pay

## 2018-05-07 ENCOUNTER — Encounter (HOSPITAL_COMMUNITY): Payer: Self-pay | Admitting: Internal Medicine

## 2018-05-07 ENCOUNTER — Inpatient Hospital Stay (HOSPITAL_BASED_OUTPATIENT_CLINIC_OR_DEPARTMENT_OTHER): Payer: Medicare Other | Admitting: Internal Medicine

## 2018-05-07 VITALS — BP 147/64 | HR 59 | Temp 98.5°F | Resp 14 | Wt 165.9 lb

## 2018-05-07 DIAGNOSIS — R197 Diarrhea, unspecified: Secondary | ICD-10-CM | POA: Diagnosis not present

## 2018-05-07 DIAGNOSIS — Z87891 Personal history of nicotine dependence: Secondary | ICD-10-CM

## 2018-05-07 DIAGNOSIS — Z85038 Personal history of other malignant neoplasm of large intestine: Secondary | ICD-10-CM | POA: Diagnosis not present

## 2018-05-07 DIAGNOSIS — D509 Iron deficiency anemia, unspecified: Secondary | ICD-10-CM

## 2018-05-07 DIAGNOSIS — K5900 Constipation, unspecified: Secondary | ICD-10-CM | POA: Diagnosis not present

## 2018-05-07 DIAGNOSIS — Z9221 Personal history of antineoplastic chemotherapy: Secondary | ICD-10-CM | POA: Diagnosis not present

## 2018-05-07 DIAGNOSIS — C184 Malignant neoplasm of transverse colon: Secondary | ICD-10-CM

## 2018-05-07 DIAGNOSIS — R918 Other nonspecific abnormal finding of lung field: Secondary | ICD-10-CM | POA: Diagnosis not present

## 2018-05-07 NOTE — Progress Notes (Signed)
Diagnosis Adenocarcinoma of transverse colon ([place]) - Plan: CT CHEST W CONTRAST, CT ABDOMEN PELVIS W CONTRAST, CBC with Differential/Platelet, Comprehensive metabolic panel, Lactate dehydrogenase, CEA, MM Digital Screening  Staging Cancer Staging Adenocarcinoma of transverse colon ([place]) Staging form: Colon and Rectum, AJCC 7th Edition - Clinical: Stage IIIB (T3, N2a, M0) - Signed by Baird Cancer, PA-C on 02/08/2014 - Pathologic: No stage assigned - Unsigned   Assessment and Plan:  1.  Stage III adenocarcinoma of transverse colon.  Pt was diagnosed in 12/2013; She was previously followed by Dr Talbert Cage and was treated with segmental surgical resection of colon by Dr. Arnoldo Morale, followed by FOLFOX chemo x 3 cycles; chemotherapy course was complicated by diarrhea and further chemo was discontinued. CEA not elevated at 3.8 in 12/2013;   Labs done 04/25/2018 reviewed and showed WBC 6.8 HB 13.8 plts 207,000.  Chemistries WNL with K+ 3.9 Cr 0.79 and normal LFTs.  CEA 4.3.    CT CAP done 05/01/2018 reviewed and showed  IMPRESSION: 1. Previously described central mesenteric soft tissue mass (likely an enlarged lymph node) is similar to prior examinations. This has previously been hypermetabolic on prior PET CTs, but the stability over the prior examinations is reassuring. No other definite sites of metastatic disease are noted elsewhere in the abdomen or pelvis. 2. All previously noted pulmonary nodules appear stable in size and number compared to prior studies. Close attention on follow-up examinations is recommended to ensure continued stability. 3. Colonic diverticulosis without evidence of acute diverticulitis at this time. 4. Small periumbilical hernia containing only a small amount of omental fat. No associated bowel incarceration or obstruction at this time.  Pt had colonoscopy done 03/28/2018 that showed diverticulosis, hemorrhoids and polyp.  Pathology for polyp was negative for dysplasia  or malignancy.    Pt will be set up for scans in 10/2018 and will follow-up with labs and scans at that time.   2. Constipation/ Diarrhea.   Felt secondary to previous colon surgical resection. Controlled with prn Imodium.  Follow-up with GI as recommended.    3.  IDA.  HB is 13.8.  Will check ferritin on RTC.    4.  ? Pancreatic changes.  Pt had fatty changes mentioned on prior imaging done 10/2017.  CT of abdomen done 05/01/2018 showed no pancreatic lesion and no inflammatory changes.    5  Health maintenance.  Pt has not had mammogram done since 04/2016.  Pt is set up for 3D Screening mammogram.    Interval History: Historical data obtained from note dated 11/05/2017.   72 yo female diagnosed with Stage III CRC in 12/2013; treated with segmental surgical resection of colon by Dr. Arnoldo Morale, followed by FOLFOX chemo x 3 cycles; chemotherapy course was complicated by diarrhea and further chemo was discontinued.   CEA not elevated at 3.8 in 12/2013;  Surgical resection of colon path: 12/29/13   Current Status: Patient is seen today for follow-up.  She is here today to go over her labs and scans.  She reports occasional diarrhea.    Adenocarcinoma of transverse colon ([place])   12/09/2013 Initial Diagnosis    1. Colon, biopsy, proximal transverse mass INVASIVE ADENOCARCINOMA.    12/29/2013 Definitive Surgery    Colon, segmental resection for tumor, right - INVASIVE COLORECTAL ADENOCARCINOMA, 7 CM, EXTENDING INTO PERICOLONIC CONNECTIVE TISSUE. - METASTATIC CARCINOMA IN 6 OF 33 LYMPH NODES (6/33).    02/10/2014 - 03/10/2014 Adjuvant Chemotherapy    FOLFOX x 3 cycles    03/11/2014  Adverse Reaction    Patient called reporting diarrhea despite dose reduction and she wants to stop therapy.  Patient seen on 03/24/14 to discuss other treatment options and she stands by her decision to stop all therapy.    04/01/2014 Surgery    Port-a-cath removal by Dr. Arnoldo Morale.    01/12/2015 PET scan    Mild abdominal  mesenteric LAD show hypermetabolic activity, 7 mm indeterminate pulm nodule in sup segment of RLL shows no assoc metabolic activity    2/63/3354 PET scan    Mild progression of nodal mets in anterior mid abdominal mesentery, new nodal mets at L thoracic inlet. stable 6 mm nodule in posterior RLL, unchanged since 2015    06/22/2015 PET scan    Resolution of metabolic activity and decreased size of mild LAD at L thoracic inlet, stable mild mid abd hypermetabolic mesenteric LAD, stable 6 mm posterior LLL pulm nodule without metabolic activity    5/62/5638 Imaging    MRI lumbar spine L4-L5 disc degenerated, broad based disc hernation, B facet arthropathy with hypertophy and edema, stenosis of lateral recesses that could cause neural compression    10/18/2016 Imaging    CT CAP- 1. Several small ground-glass pulmonary nodules are present in the lungs and are stable over the past 9 months, but several are mildly larger than they were in 2015. Low-grade adenocarcinoma can sometimes have this appearance and surveillance is likely warranted. 2. There is a cluster of nodal tissue in the central mesentery, maximum short axis diameter 1.3 cm. This nodal cluster is relatively low in density and not appreciably changed from the prior exam. Given the lack of change is may represent quiescent residua of malignancy, but again, surveillance is likely warranted. 3. The left-sided rib fractures are more numerous than was revealed on the prior radiographs ; there are nondisplaced healing fractures of the left fifth, sixth, seventh, eighth, ninth, and tenth ribs. 4. Mild cardiomegaly although part of this appearance may be due to pectus excavatum. 5. Several additional small pulmonary nodules are stable from 2015 and considered benign. 6. Right hemicolectomy and sigmoid colon diverticulosis. 7. Small right paraumbilical hernia containing adipose tissue. 8. Lower lumbar spondylosis and degenerative disc  disease potentially with mild impingement at L4-5.    04/25/2017 Imaging    CT C/A/P: Scattered solid pulmonary nodules measure up to 6 mm in the right lower lobe, unchanged. There are a few scattered ground-glass nodules measuring up to 8 mm in the right upper lobe, also stable. Distal perigastric lymph nodes are stable. No additional evidence of metastatic disease.      Problem List Patient Active Problem List   Diagnosis Date Noted  . Colon cancer ([place]) [C18.9]   . History of colon cancer [Z85.038] 01/25/2018  . Iron deficiency [E61.1] 02/18/2014  . Adenocarcinoma of transverse colon ([place]) [C18.4] 12/29/2013  . Mass of pancreas [K86.89] 12/08/2013    Past Medical History Past Medical History:  Diagnosis Date  . Arthritis    wrist and thumbs  . Colon cancer ([place])   . PONV (postoperative nausea and vomiting)   . Ruptured lumbar disc    L1-L5 per patient report  . Sciatica of left side     Past Surgical History Past Surgical History:  Procedure Laterality Date  . COLONOSCOPY N/A 12/12/2013   Procedure: COLONOSCOPY;  Surgeon: Rogene Houston, MD;  [facility];  Service: Endoscopy;  Laterality: N/A;  730  . COLONOSCOPY N/A 01/20/2015   Procedure: COLONOSCOPY;  Surgeon:  Rogene Houston, MD;  [facility];  Service: Endoscopy;  Laterality: N/A;  1030  . COLONOSCOPY N/A 03/28/2018   Procedure: COLONOSCOPY;  Surgeon: Rogene Houston, MD;  [facility];  Service: Endoscopy;  Laterality: N/A;  1015  . EUS N/A 12/18/2013   Procedure: UPPER ENDOSCOPIC ULTRASOUND (EUS) LINEAR;  Surgeon: Milus Banister, MD;  Location: [facility];  Service: Endoscopy;  Laterality: N/A;  . herniated disc     1996-c5-c7  . PARTIAL COLECTOMY N/A 12/29/2013   Procedure: RIGHT HEMICOLECTOMY;  Surgeon: Jamesetta So, MD;  Location: [facility];  Service: General;  Laterality: N/A;  . POLYPECTOMY  03/28/2018   Procedure: POLYPECTOMY;  Surgeon: Rogene Houston, MD;  [facility];  Service: Endoscopy;;  transverse colon (hot snare);  Marland Kitchen PORT-A-CATH REMOVAL Right 04/01/2014   Procedure: MINOR REMOVAL PORT-A-CATH;  Surgeon: Jamesetta So, MD;  Location: [facility];  Service: General;  Laterality: Right;  . PORTACATH PLACEMENT Right 02/04/2014   Procedure: INSERTION PORT-A-CATH;  Surgeon: Jamesetta So, MD;  Location: [facility];  Service: General;  Laterality: Right;    Family History Family History  Problem Relation Age of Onset  . Diabetes type II Mother   . Dementia Father      Social History  reports that she quit smoking about 59 years ago. Her smoking use included cigarettes. She has a 1.25 pack-year smoking history. She has never used smokeless tobacco. She reports that she does not drink alcohol or use drugs.  Medications  Current Outpatient Medications:  .  acetaminophen (TYLENOL) 500 MG tablet, Take 500 mg by mouth every 6 (six) hours as needed for moderate pain or headache., Disp: , Rfl:  .  aspirin (ASPIRIN EC) 81 MG EC tablet, Take 1 tablet (81 mg total) by mouth daily. Swallow whole., Disp: 30 tablet, Rfl: 12 .  Bioflavonoid Products (ESTER C PO), Take 1,000 mg by mouth daily., Disp: , Rfl:  .  Garlic 5093 MG CAPS, Take 1,000 mg by mouth daily. , Disp: , Rfl:  .  Multiple Vitamin (MULTIVITAMIN) tablet, Take 1 tablet by mouth daily., Disp: , Rfl:  .  Omega-3 Fatty Acids (FISH OIL) 1200 MG CAPS, Take 1,200 mg by mouth daily. , Disp: , Rfl:   Allergies Sulfa antibiotics  Review of Systems Review of Systems - Oncology ROS negative other than constipation or diarrhea   Physical Exam  Vitals Wt Readings from Last 3 Encounters:  05/07/18 165 lb 14.4 oz (75.3 kg)  03/28/18 168 lb (76.2 kg)  11/05/17 167 lb (75.8 kg)   Temp Readings from Last 3 Encounters:  05/07/18 98.5 F (36.9 C) (Oral)  03/28/18 97.9 F (36.6 C) (Oral)  11/05/17 97.9 F (36.6 C) (Oral)   BP Readings from Last 3 Encounters:  05/07/18 (!) 147/64  03/28/18 125/71   11/05/17 (!) 180/77   Pulse Readings from Last 3 Encounters:  05/07/18 (!) 59  03/28/18 61  11/05/17 69   Constitutional: Well-developed, well-nourished, and in no distress.   HENT: Head: Normocephalic and atraumatic.  Mouth/Throat: No oropharyngeal exudate. Mucosa moist. Eyes: Pupils are equal, round, and reactive to light. Conjunctivae are normal. No scleral icterus.  Neck: Normal range of motion. Neck supple. No JVD present.  Cardiovascular: Normal rate, regular rhythm and normal heart sounds.  Exam reveals no gallop and no friction rub.   No murmur heard. Pulmonary/Chest: Effort normal and breath sounds normal. No respiratory distress. No wheezes.No rales.  Abdominal: Soft. Bowel sounds are normal. No distension. There is no tenderness. There is no guarding. Scar noted on abdomen from prior surgery.   Musculoskeletal: No edema or tenderness.  Lymphadenopathy: No cervical, axillary  or supraclavicular adenopathy.  Neurological: Alert and oriented to person, place, and time. No cranial nerve deficit.  Skin: Skin is warm and dry. No rash noted. No erythema. No pallor.  Psychiatric: Affect and judgment normal.   Labs No visits with results within 3 Day(s) from this visit.  Latest known visit with results is:  Appointment on 04/25/2018  Component Date Value Ref Range Status  . WBC 04/25/2018 6.8  4.0 - 10.5 K/uL Final  . RBC 04/25/2018 4.69  3.87 - 5.11 MIL/uL Final  . Hemoglobin 04/25/2018 13.8  12.0 - 15.0 g/dL Final  . HCT 04/25/2018 43.9  36.0 - 46.0 % Final  . MCV 04/25/2018 93.6  80.0 - 100.0 fL Final  . MCH 04/25/2018 29.4  26.0 - 34.0 pg Final  . MCHC 04/25/2018 31.4  30.0 - 36.0 g/dL Final  . RDW 04/25/2018 13.2  11.5 - 15.5 % Final  . Platelets 04/25/2018 207  150 - 400 K/uL Final  . nRBC 04/25/2018 0.0  0.0 - 0.2 % Final  . Neutrophils Relative % 04/25/2018 64  % Final  . Neutro Abs 04/25/2018 4.3  1.7 - 7.7 K/uL Final  . Lymphocytes Relative 04/25/2018 26  % Final   . Lymphs Abs 04/25/2018 1.8  0.7 - 4.0 K/uL Final  . Monocytes Relative 04/25/2018 8  % Final  . Monocytes Absolute 04/25/2018 0.6  0.1 - 1.0 K/uL Final  . Eosinophils Relative 04/25/2018 1  % Final  . Eosinophils Absolute 04/25/2018 0.1  0.0 - 0.5 K/uL Final  . Basophils Relative 04/25/2018 1  % Final  . Basophils Absolute 04/25/2018 0.0  0.0 - 0.1 K/uL Final  . Immature Granulocytes 04/25/2018 0  % Final  . Abs Immature Granulocytes 04/25/2018 0.02  0.00 - 0.07 K/uL Final   Performed at [facility], [address] [place], [place] [address]  . Sodium 04/25/2018 140  135 - 145 mmol/L Final  . Potassium 04/25/2018 3.9  3.5 - 5.1 mmol/L Final  . Chloride 04/25/2018 104  98 - 111 mmol/L Final  . CO2 04/25/2018 27  22 - 32 mmol/L Final  . Glucose, Bld 04/25/2018 109* 70 - 99 mg/dL Final  . BUN 04/25/2018 13  8 - 23 mg/dL Final  . Creatinine, Ser 04/25/2018 0.79  0.44 - 1.00 mg/dL Final  . Calcium 04/25/2018 9.4  8.9 - 10.3 mg/dL Final  . Total Protein 04/25/2018 7.4  6.5 - 8.1 g/dL Final  . Albumin 04/25/2018 4.0  3.5 - 5.0 g/dL Final  . AST 04/25/2018 20  15 - 41 U/L Final  . ALT 04/25/2018 15  0 - 44 U/L Final  . Alkaline Phosphatase 04/25/2018 53  38 - 126 U/L Final  . Total Bilirubin 04/25/2018 0.6  0.3 - 1.2 mg/dL Final  . GFR calc non Af Amer 04/25/2018 >60  >60 mL/min Final  . GFR calc Af Amer 04/25/2018 >60  >60 mL/min Final   Comment: (NOTE) The eGFR has been calculated using the CKD EPI equation. This calculation has not been validated in all clinical situations. eGFR's persistently <60 mL/min signify possible Chronic Kidney Disease.   Georgiann Hahn gap 04/25/2018 9  5 - 15 Final   Performed at [facility], [address], [place], [place] [address]  . LDH  04/25/2018 153  98 - 192 U/L Final   Performed at [facility], [address] [place], [place] [address]  . CEA 04/25/2018 4.3  0.0 - 4.7 ng/mL Final   Comment: (NOTE)                             Nonsmokers           <3.9                             Smokers             <5.6 [employer] Electrochemiluminescence Immunoassay (ECLIA) Values obtained with different assay methods or kits cannot be used interchangeably.  Results cannot be interpreted as absolute evidence of the presence or absence of malignant disease. Performed At: [facility] [place], [place] [id number] Rush Farmer MD KA:[id number]      Pathology Orders Placed This Encounter  Procedures  . CT CHEST W CONTRAST    Standing Status:   Future    Standing Expiration Date:   05/07/2019    Order Specific Question:   If indicated for the ordered procedure, I authorize the administration of contrast media per Radiology protocol    Answer:   Yes    Order Specific Question:   Preferred imaging location?    Answer:   [facility]    Order Specific Question:   Radiology Contrast Protocol - do NOT remove file path    Answer:   [email or web address]  . CT ABDOMEN PELVIS W CONTRAST    Standing Status:   Future    Standing Expiration Date:   05/07/2019    Order Specific Question:   If indicated for the ordered procedure, I authorize the administration of contrast media per Radiology protocol    Answer:   Yes    Order Specific Question:   Preferred imaging location?    Answer:   [facility]    Order Specific Question:   Is Oral Contrast requested for this exam?    Answer:   Yes, Per Radiology protocol    Order Specific Question:   Radiology Contrast Protocol - do NOT remove file path    Answer:   \\[email or web address]  . MM Digital Screening    Standing Status:   Future    Standing Expiration Date:   05/07/2019    Order Specific Question:   Reason for Exam (SYMPTOM  OR DIAGNOSIS REQUIRED)    Answer:   screening    Order Specific Question:   Preferred imaging location?    Answer:   [facility]  . CBC with Differential/Platelet    Standing  Status:   Future    Standing Expiration Date:   05/08/2019  . Comprehensive metabolic panel    Standing Status:   Future    Standing Expiration Date:   05/08/2019  . Lactate dehydrogenase    Standing Status:   Future    Standing Expiration Date:   05/08/2019  . CEA    Standing Status:   Future    Standing Expiration Date:   05/08/2019       Zoila Shutter MD

## 2018-05-16 ENCOUNTER — Ambulatory Visit (HOSPITAL_COMMUNITY)
Admission: RE | Admit: 2018-05-16 | Discharge: 2018-05-16 | Disposition: A | Discharge status: Home / Self Care | Payer: Medicare Other | Source: Ambulatory Visit | Attending: Internal Medicine | Admitting: Internal Medicine

## 2018-05-16 ENCOUNTER — Other Ambulatory Visit (HOSPITAL_COMMUNITY): Payer: Self-pay | Admitting: Internal Medicine

## 2018-05-16 DIAGNOSIS — Z1231 Encounter for screening mammogram for malignant neoplasm of breast: Secondary | ICD-10-CM | POA: Diagnosis not present

## 2018-05-16 DIAGNOSIS — C184 Malignant neoplasm of transverse colon: Secondary | ICD-10-CM

## 2018-05-21 DIAGNOSIS — M5416 Radiculopathy, lumbar region: Secondary | ICD-10-CM | POA: Diagnosis not present

## 2018-05-21 DIAGNOSIS — M48062 Spinal stenosis, lumbar region with neurogenic claudication: Secondary | ICD-10-CM | POA: Diagnosis not present

## 2018-07-22 DIAGNOSIS — Z1389 Encounter for screening for other disorder: Secondary | ICD-10-CM | POA: Diagnosis not present

## 2018-07-22 DIAGNOSIS — G894 Chronic pain syndrome: Secondary | ICD-10-CM | POA: Diagnosis not present

## 2018-07-22 DIAGNOSIS — C189 Malignant neoplasm of colon, unspecified: Secondary | ICD-10-CM | POA: Diagnosis not present

## 2018-07-22 DIAGNOSIS — Z6828 Body mass index (BMI) 28.0-28.9, adult: Secondary | ICD-10-CM | POA: Diagnosis not present

## 2018-07-22 DIAGNOSIS — C184 Malignant neoplasm of transverse colon: Secondary | ICD-10-CM | POA: Diagnosis not present

## 2018-09-09 DIAGNOSIS — N390 Urinary tract infection, site not specified: Secondary | ICD-10-CM | POA: Diagnosis not present

## 2018-09-09 DIAGNOSIS — J209 Acute bronchitis, unspecified: Secondary | ICD-10-CM | POA: Diagnosis not present

## 2018-09-09 DIAGNOSIS — N3000 Acute cystitis without hematuria: Secondary | ICD-10-CM | POA: Diagnosis not present

## 2018-09-09 DIAGNOSIS — J069 Acute upper respiratory infection, unspecified: Secondary | ICD-10-CM | POA: Diagnosis not present

## 2018-10-24 DIAGNOSIS — Z6828 Body mass index (BMI) 28.0-28.9, adult: Secondary | ICD-10-CM | POA: Diagnosis not present

## 2018-10-24 DIAGNOSIS — M25562 Pain in left knee: Secondary | ICD-10-CM | POA: Diagnosis not present

## 2018-10-24 DIAGNOSIS — M7122 Synovial cyst of popliteal space [Baker], left knee: Secondary | ICD-10-CM | POA: Diagnosis not present

## 2018-10-24 DIAGNOSIS — E663 Overweight: Secondary | ICD-10-CM | POA: Diagnosis not present

## 2018-10-25 ENCOUNTER — Other Ambulatory Visit (HOSPITAL_COMMUNITY): Payer: Self-pay | Admitting: *Deleted

## 2018-10-25 DIAGNOSIS — C184 Malignant neoplasm of transverse colon: Secondary | ICD-10-CM

## 2018-10-25 DIAGNOSIS — K8689 Other specified diseases of pancreas: Secondary | ICD-10-CM

## 2018-10-28 ENCOUNTER — Inpatient Hospital Stay (HOSPITAL_COMMUNITY): Payer: Medicare Other | Attending: Hematology

## 2018-10-28 ENCOUNTER — Other Ambulatory Visit: Payer: Self-pay

## 2018-10-28 DIAGNOSIS — Z85038 Personal history of other malignant neoplasm of large intestine: Secondary | ICD-10-CM | POA: Insufficient documentation

## 2018-10-28 DIAGNOSIS — K8689 Other specified diseases of pancreas: Secondary | ICD-10-CM

## 2018-10-28 DIAGNOSIS — D509 Iron deficiency anemia, unspecified: Secondary | ICD-10-CM | POA: Diagnosis not present

## 2018-10-28 DIAGNOSIS — Z7982 Long term (current) use of aspirin: Secondary | ICD-10-CM | POA: Insufficient documentation

## 2018-10-28 DIAGNOSIS — Z87891 Personal history of nicotine dependence: Secondary | ICD-10-CM | POA: Insufficient documentation

## 2018-10-28 DIAGNOSIS — Z9049 Acquired absence of other specified parts of digestive tract: Secondary | ICD-10-CM | POA: Insufficient documentation

## 2018-10-28 DIAGNOSIS — Z79899 Other long term (current) drug therapy: Secondary | ICD-10-CM | POA: Diagnosis not present

## 2018-10-28 DIAGNOSIS — C184 Malignant neoplasm of transverse colon: Secondary | ICD-10-CM

## 2018-10-28 DIAGNOSIS — Z9221 Personal history of antineoplastic chemotherapy: Secondary | ICD-10-CM | POA: Diagnosis not present

## 2018-10-28 LAB — CBC WITH DIFFERENTIAL/PLATELET
Abs Immature Granulocytes: 0.02 10*3/uL (ref 0.00–0.07)
Basophils Absolute: 0 10*3/uL (ref 0.0–0.1)
Basophils Relative: 1 %
Eosinophils Absolute: 0.1 10*3/uL (ref 0.0–0.5)
Eosinophils Relative: 1 %
HCT: 44 % (ref 36.0–46.0)
Hemoglobin: 14.4 g/dL (ref 12.0–15.0)
Immature Granulocytes: 0 %
Lymphocytes Relative: 24 %
Lymphs Abs: 1.9 10*3/uL (ref 0.7–4.0)
MCH: 30.4 pg (ref 26.0–34.0)
MCHC: 32.7 g/dL (ref 30.0–36.0)
MCV: 93 fL (ref 80.0–100.0)
Monocytes Absolute: 0.8 10*3/uL (ref 0.1–1.0)
Monocytes Relative: 10 %
Neutro Abs: 4.9 10*3/uL (ref 1.7–7.7)
Neutrophils Relative %: 64 %
Platelets: 215 10*3/uL (ref 150–400)
RBC: 4.73 MIL/uL (ref 3.87–5.11)
RDW: 12.6 % (ref 11.5–15.5)
WBC: 7.8 10*3/uL (ref 4.0–10.5)
nRBC: 0 % (ref 0.0–0.2)

## 2018-10-28 LAB — COMPREHENSIVE METABOLIC PANEL
ALT: 14 U/L (ref 0–44)
AST: 22 U/L (ref 15–41)
Albumin: 4.4 g/dL (ref 3.5–5.0)
Alkaline Phosphatase: 56 U/L (ref 38–126)
Anion gap: 9 (ref 5–15)
BUN: 19 mg/dL (ref 8–23)
CO2: 27 mmol/L (ref 22–32)
Calcium: 9.8 mg/dL (ref 8.9–10.3)
Chloride: 102 mmol/L (ref 98–111)
Creatinine, Ser: 0.68 mg/dL (ref 0.44–1.00)
GFR calc Af Amer: >60 mL/min (ref >60)
GFR calc non Af Amer: >60 mL/min (ref >60)
Glucose, Bld: 108 mg/dL — ABNORMAL HIGH (ref 70–99)
Potassium: 4.6 mmol/L (ref 3.5–5.1)
Sodium: 138 mmol/L (ref 135–145)
Total Bilirubin: 0.5 mg/dL (ref 0.3–1.2)
Total Protein: 8.1 g/dL (ref 6.5–8.1)

## 2018-10-28 LAB — LACTATE DEHYDROGENASE: LDH: 170 U/L (ref 98–192)

## 2018-10-28 LAB — FERRITIN: Ferritin: 119 ng/mL (ref 11–307)

## 2018-10-29 LAB — CEA: CEA: 4.6 ng/mL (ref 0.0–4.7)

## 2018-11-04 ENCOUNTER — Other Ambulatory Visit: Payer: Self-pay

## 2018-11-04 ENCOUNTER — Ambulatory Visit (HOSPITAL_COMMUNITY)
Admission: RE | Admit: 2018-11-04 | Discharge: 2018-11-04 | Disposition: A | Discharge status: Home / Self Care | Payer: Medicare Other | Source: Ambulatory Visit | Attending: Internal Medicine | Admitting: Internal Medicine

## 2018-11-04 DIAGNOSIS — C184 Malignant neoplasm of transverse colon: Secondary | ICD-10-CM

## 2018-11-04 DIAGNOSIS — Z85038 Personal history of other malignant neoplasm of large intestine: Secondary | ICD-10-CM | POA: Diagnosis not present

## 2018-11-04 DIAGNOSIS — R918 Other nonspecific abnormal finding of lung field: Secondary | ICD-10-CM | POA: Diagnosis not present

## 2018-11-04 MED ORDER — IOHEXOL 300 MG/ML  SOLN
100.0000 mL | Freq: Once | INTRAMUSCULAR | Status: AC | PRN
  Administered 2018-11-04: 100 mL via INTRAVENOUS

## 2018-11-05 ENCOUNTER — Other Ambulatory Visit: Payer: Self-pay

## 2018-11-06 ENCOUNTER — Inpatient Hospital Stay (HOSPITAL_BASED_OUTPATIENT_CLINIC_OR_DEPARTMENT_OTHER): Payer: Medicare Other | Admitting: Hematology

## 2018-11-06 VITALS — BP 186/117 | HR 86 | Temp 98.2°F | Resp 18 | Wt 163.3 lb

## 2018-11-06 DIAGNOSIS — Z7982 Long term (current) use of aspirin: Secondary | ICD-10-CM | POA: Diagnosis not present

## 2018-11-06 DIAGNOSIS — Z85038 Personal history of other malignant neoplasm of large intestine: Secondary | ICD-10-CM

## 2018-11-06 DIAGNOSIS — Z79899 Other long term (current) drug therapy: Secondary | ICD-10-CM

## 2018-11-06 DIAGNOSIS — D509 Iron deficiency anemia, unspecified: Secondary | ICD-10-CM | POA: Diagnosis not present

## 2018-11-06 DIAGNOSIS — Z9049 Acquired absence of other specified parts of digestive tract: Secondary | ICD-10-CM

## 2018-11-06 DIAGNOSIS — Z9221 Personal history of antineoplastic chemotherapy: Secondary | ICD-10-CM

## 2018-11-06 DIAGNOSIS — C184 Malignant neoplasm of transverse colon: Secondary | ICD-10-CM

## 2018-11-06 NOTE — Assessment & Plan Note (Addendum)
1.  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 - Status post 3 cycles of FOLFOX from 02/10/2014 through 03/10/2014, discontinued secondary to poor tolerance.  DPD was negative. -Last colonoscopy on 03/28/2018 patent ileo-colonic anastomosis.  8 mm polyp in the transverse colon.  Diverticulosis in the sigmoid colon, descending colon. - CT scan on 11/04/2018 shows postoperative findings of right colectomy with redemonstrated mesenteric nodule/lymph node measuring 1.9 x 1.7 cm, unchanged from prior scan in October 2019.  No evidence of metastatic disease.  Stable solid and groundglass pulmonary nodules, stable over multiple prior exams. -CEA was 4.6 on 10/28/2018. -She does not report any change in bowel habits.  She has baseline diarrhea.  No bleeding per rectum reported. - I will see her back in 6 months with repeat CEA level.  Because of her high risk disease, I plan to repeat scans in 1 year.  2.  Health maintenance: -Mammogram dated 05/16/2018 was BI-RADS Category 1.

## 2018-11-06 NOTE — Patient Instructions (Addendum)
[facility] at [facility] Discharge Instructions  You were seen today by Dr. Katragadda. He went over your recent lab and scan results. He will see you back in 6 months for labs and follow up.   Thank you for choosing [facility] at [facility] to provide your oncology and hematology care.  To afford each patient quality time with our provider, please arrive at least 15 minutes before your scheduled appointment time.   If you have a lab appointment with the Cancer Center please come in thru the  Main Entrance and check in at the main information desk  You need to re-schedule your appointment should you arrive 10 or more minutes late.  We strive to give you quality time with our providers, and arriving late affects you and other patients whose appointments are after yours.  Also, if you no show three or more times for appointments you may be dismissed from the clinic at the providers discretion.     Again, thank you for choosing [facility].  Our hope is that these requests will decrease the amount of time that you wait before being seen by our physicians.       _____________________________________________________________  Should you have questions after your visit to [facility], please contact our office at [phone] between the hours of 8:00 a.m. and 4:30 p.m.  Voicemails left after 4:00 p.m. will not be returned until the following business day.  For prescription refill requests, have your pharmacy contact our office and allow 72 hours.    Cancer Center Support Programs:   > Cancer Support Group  2nd Tuesday of the month 1pm-2pm, Journey Room    

## 2018-11-06 NOTE — Progress Notes (Signed)
[address] [place], [place] [address]   CLINIC:  Medical Oncology/Hematology  PCP:  [facility], [place] [address] [phone]   REASON FOR VISIT:  Follow-up for Adenocarcinoma of transverse colon    BRIEF ONCOLOGIC HISTORY:    Adenocarcinoma of transverse colon ([place])   12/09/2013 Initial Diagnosis    1. Colon, biopsy, proximal transverse mass INVASIVE ADENOCARCINOMA.    12/29/2013 Definitive Surgery    Colon, segmental resection for tumor, right - INVASIVE COLORECTAL ADENOCARCINOMA, 7 CM, EXTENDING INTO PERICOLONIC CONNECTIVE TISSUE. - METASTATIC CARCINOMA IN 6 OF 33 LYMPH NODES (6/33).    02/10/2014 - 03/10/2014 Adjuvant Chemotherapy    FOLFOX x 3 cycles    03/11/2014 Adverse Reaction    Patient called reporting diarrhea despite dose reduction and she wants to stop therapy.  Patient seen on 03/24/14 to discuss other treatment options and she stands by her decision to stop all therapy.    04/01/2014 Surgery    Port-a-cath removal by Dr. Arnoldo Morale.    01/12/2015 PET scan    Mild abdominal mesenteric LAD show hypermetabolic activity, 7 mm indeterminate pulm nodule in sup segment of RLL shows no assoc metabolic activity    6/57/8469 PET scan    Mild progression of nodal mets in anterior mid abdominal mesentery, new nodal mets at L thoracic inlet. stable 6 mm nodule in posterior RLL, unchanged since 2015    06/22/2015 PET scan    Resolution of metabolic activity and decreased size of mild LAD at L thoracic inlet, stable mild mid abd hypermetabolic mesenteric LAD, stable 6 mm posterior LLL pulm nodule without metabolic activity    01/06/5283 Imaging    MRI lumbar spine L4-L5 disc degenerated, broad based disc hernation, B facet arthropathy with hypertophy and edema, stenosis of lateral recesses that could cause neural compression    10/18/2016 Imaging    CT CAP- 1. Several small ground-glass pulmonary nodules are present in the  lungs and are stable over the past 9 months, but several are mildly larger than they were in 2015. Low-grade adenocarcinoma can sometimes have this appearance and surveillance is likely warranted. 2. There is a cluster of nodal tissue in the central mesentery, maximum short axis diameter 1.3 cm. This nodal cluster is relatively low in density and not appreciably changed from the prior exam. Given the lack of change is may represent quiescent residua of malignancy, but again, surveillance is likely warranted. 3. The left-sided rib fractures are more numerous than was revealed on the prior radiographs ; there are nondisplaced healing fractures of the left fifth, sixth, seventh, eighth, ninth, and tenth ribs. 4. Mild cardiomegaly although part of this appearance may be due to pectus excavatum. 5. Several additional small pulmonary nodules are stable from 2015 and considered benign. 6. Right hemicolectomy and sigmoid colon diverticulosis. 7. Small right paraumbilical hernia containing adipose tissue. 8. Lower lumbar spondylosis and degenerative disc disease potentially with mild impingement at L4-5.    04/25/2017 Imaging    CT C/A/P: Scattered solid pulmonary nodules measure up to 6 mm in the right lower lobe, unchanged. There are a few scattered ground-glass nodules measuring up to 8 mm in the right upper lobe, also stable. Distal perigastric lymph nodes are stable. No additional evidence of metastatic disease.      CANCER STAGING: Cancer Staging Adenocarcinoma of transverse colon ([place]) Staging form: Colon and Rectum, AJCC 7th Edition - Clinical: Stage IIIB (T3, N2a, M0) - Signed by  Baird Cancer, PA-C on 02/08/2014 - Pathologic: No stage assigned - Unsigned    INTERVAL HISTORY:  Ms. Dilworth 73 y.o. female returns for routine follow-up. She is here today alone. She states that she has been doing well since her last visit. Denies any nausea, or vomiting. Denies any new pains. Had not  noticed any recent bleeding such as epistaxis, hematuria or hematochezia. Denies recent chest pain on exertion, shortness of breath on minimal exertion, pre-syncopal episodes, or palpitations. Denies any numbness or tingling in hands or feet. Denies any recent fevers, infections, or recent hospitalizations. Patient reports appetite at 100% and energy level at 50%.    REVIEW OF SYSTEMS:  Review of Systems  Gastrointestinal: Positive for diarrhea.  All other systems reviewed and are negative.    PAST MEDICAL/SURGICAL HISTORY:  Past Medical History:  Diagnosis Date  . Arthritis    wrist and thumbs  . Colon cancer ([place])   . PONV (postoperative nausea and vomiting)   . Ruptured lumbar disc    L1-L5 per patient report  . Sciatica of left side    Past Surgical History:  Procedure Laterality Date  . COLONOSCOPY N/A 12/12/2013   Procedure: COLONOSCOPY;  Surgeon: Rogene Houston, MD;  [facility];  Service: Endoscopy;  Laterality: N/A;  730  . COLONOSCOPY N/A 01/20/2015   Procedure: COLONOSCOPY;  Surgeon: Rogene Houston, MD;  [facility];  Service: Endoscopy;  Laterality: N/A;  1030  . COLONOSCOPY N/A 03/28/2018   Procedure: COLONOSCOPY;  Surgeon: Rogene Houston, MD;  [facility]: [facility];  Service: Endoscopy;  Laterality: N/A;  1015  . EUS N/A 12/18/2013   Procedure: UPPER ENDOSCOPIC ULTRASOUND (EUS) LINEAR;  Surgeon: Milus Banister, MD;  Location: [facility];  Service: Endoscopy;  Laterality: N/A;  . herniated disc     1996-[id number]  . PARTIAL COLECTOMY N/A 12/29/2013   Procedure: RIGHT HEMICOLECTOMY;  Surgeon: Jamesetta So, MD;  Location: [facility];  Service: General;  Laterality: N/A;  . POLYPECTOMY  03/28/2018   Procedure: POLYPECTOMY;  Surgeon: Rogene Houston, MD;  [facility];  Service: Endoscopy;;  transverse colon (hot snare);  Marland Kitchen PORT-A-CATH REMOVAL Right 04/01/2014   Procedure: MINOR REMOVAL PORT-A-CATH;  Surgeon: Jamesetta So, MD;   Location: [facility];  Service: General;  Laterality: Right;  . PORTACATH PLACEMENT Right 02/04/2014   Procedure: INSERTION PORT-A-CATH;  Surgeon: Jamesetta So, MD;  Location: [facility];  Service: General;  Laterality: Right;     SOCIAL HISTORY:  Social History   Socioeconomic History  . Marital status: Married    Spouse name: Not on file  . Number of children: Not on file  . Years of education: Not on file  . Highest education level: Not on file  Occupational History  . Not on file  Social Needs  . Financial resource strain: Not on file  . Food insecurity:    Worry: Not on file    Inability: Not on file  . Transportation needs:    Medical: Not on file    Non-medical: Not on file  Tobacco Use  . Smoking status: Former Smoker    Packs/day: 0.25    Years: 5.00    Pack years: 1.25    Types: Cigarettes    Last attempt to quit: 04/19/1959    Years since quitting: 59.5  . Smokeless tobacco: Never Used  . Tobacco comment: smoked for 5 years as teenager  Substance and Sexual  Activity  . Alcohol use: No  . Drug use: No  . Sexual activity: Yes    Birth control/protection: Post-menopausal  Lifestyle  . Physical activity:    Days per week: Not on file    Minutes per session: Not on file  . Stress: Not on file  Relationships  . Social connections:    Talks on phone: Not on file    Gets together: Not on file    Attends religious service: Not on file    Active member of club or organization: Not on file    Attends meetings of clubs or organizations: Not on file    Relationship status: Not on file  . Intimate partner violence:    Fear of current or ex partner: Not on file    Emotionally abused: Not on file    Physically abused: Not on file    Forced sexual activity: Not on file  Other Topics Concern  . Not on file  Social History Narrative  . Not on file    FAMILY HISTORY:  Family History  Problem Relation Age of Onset  . Diabetes type II Mother   . Dementia Father      CURRENT MEDICATIONS:  Outpatient Encounter Medications as of 11/06/2018  Medication Sig  . acetaminophen (TYLENOL) 500 MG tablet Take 500 mg by mouth every 6 (six) hours as needed for moderate pain or headache.  Marland Kitchen aspirin (ASPIRIN EC) 81 MG EC tablet Take 1 tablet (81 mg total) by mouth daily. Swallow whole.  Marland Kitchen Bioflavonoid Products (ESTER C PO) Take 1,000 mg by mouth daily.  . cetirizine (ZYRTEC) 10 MG tablet Take 10 mg by mouth daily.  . Cranberry-Vitamin C-Vitamin E 4200-20-3 MG-MG-UNIT CAPS Take 4,200 mg by mouth daily.  . Garlic 2952 MG CAPS Take 1,000 mg by mouth daily.   . Multiple Vitamin (MULTIVITAMIN) tablet Take 1 tablet by mouth daily.  . Omega-3 Fatty Acids (FISH OIL) 1200 MG CAPS Take 1,200 mg by mouth daily.    No facility-administered encounter medications on file as of 11/06/2018.     ALLERGIES:  Allergies  Allergen Reactions  . Sulfa Antibiotics Other (See Comments)    "Sores in my mouth"     PHYSICAL EXAM:  ECOG Performance status: 1  Vitals:   11/06/18 0822  BP: (!) 186/117  Pulse: 86  Resp: 18  Temp: 98.2 F (36.8 C)  SpO2: 99%   Filed Weights   11/06/18 0822  Weight: 163 lb 4.8 oz (74.1 kg)    Physical Exam Vitals signs reviewed.  Constitutional:      Appearance: Normal appearance.  Cardiovascular:     Rate and Rhythm: Normal rate and regular rhythm.     Heart sounds: Normal heart sounds.  Pulmonary:     Effort: Pulmonary effort is normal.     Breath sounds: Normal breath sounds.  Abdominal:     General: There is no distension.     Palpations: Abdomen is soft. There is no mass.  Musculoskeletal:        General: No swelling.  Skin:    General: Skin is warm and dry.  Neurological:     General: No focal deficit present.     Mental Status: She is alert and oriented to person, place, and time.  Psychiatric:        Mood and Affect: Mood normal.        Behavior: Behavior normal.      LABORATORY DATA:  I have reviewed the  labs as  listed.  CBC    Component Value Date/Time   WBC 7.8 10/28/2018 1044   RBC 4.73 10/28/2018 1044   HGB 14.4 10/28/2018 1044   HCT 44.0 10/28/2018 1044   PLT 215 10/28/2018 1044   MCV 93.0 10/28/2018 1044   MCH 30.4 10/28/2018 1044   MCHC 32.7 10/28/2018 1044   RDW 12.6 10/28/2018 1044   LYMPHSABS 1.9 10/28/2018 1044   MONOABS 0.8 10/28/2018 1044   EOSABS 0.1 10/28/2018 1044   BASOSABS 0.0 10/28/2018 1044   CMP Latest Ref Rng & Units 10/28/2018 04/25/2018 10/24/2017  Glucose 70 - 99 mg/dL 108(H) 109(H) 101(H)  BUN 8 - 23 mg/dL 19 13 15   Creatinine 0.44 - 1.00 mg/dL 0.68 0.79 0.69  Sodium 135 - 145 mmol/L 138 140 138  Potassium 3.5 - 5.1 mmol/L 4.6 3.9 3.7  Chloride 98 - 111 mmol/L 102 104 100(L)  CO2 22 - 32 mmol/L 27 27 27   Calcium 8.9 - 10.3 mg/dL 9.8 9.4 10.2  Total Protein 6.5 - 8.1 g/dL 8.1 7.4 7.9  Total Bilirubin 0.3 - 1.2 mg/dL 0.5 0.6 0.5  Alkaline Phos 38 - 126 U/L 56 53 64  AST 15 - 41 U/L 22 20 23   ALT 0 - 44 U/L 14 15 20        DIAGNOSTIC IMAGING:  I have independently reviewed the scans and discussed with the patient.   I have reviewed Venita Lick LPN's note and agree with the documentation.  I personally performed a face-to-face visit, made revisions and my assessment and plan is as follows.    ASSESSMENT & PLAN:   Adenocarcinoma of transverse colon ([place]) 1.  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 - Status post 3 cycles of FOLFOX from 02/10/2014 through 03/10/2014, discontinued secondary to poor tolerance.  DPD was negative. -Last colonoscopy on 03/28/2018 patent ileo-colonic anastomosis.  8 mm polyp in the transverse colon.  Diverticulosis in the sigmoid colon, descending colon. - CT scan on 11/04/2018 shows postoperative findings of right colectomy with redemonstrated mesenteric nodule/lymph node measuring 1.9  x 1.7 cm, unchanged from prior scan in October 2019.  No evidence of metastatic disease.  Stable solid and groundglass pulmonary nodules, stable over multiple prior exams. -CEA was 4.6 on 10/28/2018. -She does not report any change in bowel habits.  She has baseline diarrhea.  No bleeding per rectum reported. - I will see her back in 6 months with repeat CEA level.  Because of her high risk disease, I plan to repeat scans in 1 year.  2.  Health maintenance: -Mammogram dated 05/16/2018 was BI-RADS Category 1.      Orders placed this encounter:  Orders Placed This Encounter  Procedures  . CBC with Differential/Platelet  . Comprehensive metabolic panel  . CEA      Derek Jack, MD [place] [phone]

## 2019-01-14 DIAGNOSIS — H2513 Age-related nuclear cataract, bilateral: Secondary | ICD-10-CM | POA: Diagnosis not present

## 2019-01-14 DIAGNOSIS — H04123 Dry eye syndrome of bilateral lacrimal glands: Secondary | ICD-10-CM | POA: Diagnosis not present

## 2019-01-20 DIAGNOSIS — Z681 Body mass index (BMI) 19 or less, adult: Secondary | ICD-10-CM | POA: Diagnosis not present

## 2019-01-20 DIAGNOSIS — Z1389 Encounter for screening for other disorder: Secondary | ICD-10-CM | POA: Diagnosis not present

## 2019-01-20 DIAGNOSIS — Z0001 Encounter for general adult medical examination with abnormal findings: Secondary | ICD-10-CM | POA: Diagnosis not present

## 2019-05-01 ENCOUNTER — Other Ambulatory Visit: Payer: Self-pay

## 2019-05-01 ENCOUNTER — Inpatient Hospital Stay (HOSPITAL_COMMUNITY): Payer: Medicare Other | Attending: Hematology

## 2019-05-01 DIAGNOSIS — M549 Dorsalgia, unspecified: Secondary | ICD-10-CM | POA: Diagnosis not present

## 2019-05-01 DIAGNOSIS — Z85038 Personal history of other malignant neoplasm of large intestine: Secondary | ICD-10-CM | POA: Diagnosis not present

## 2019-05-01 DIAGNOSIS — Z9049 Acquired absence of other specified parts of digestive tract: Secondary | ICD-10-CM | POA: Insufficient documentation

## 2019-05-01 DIAGNOSIS — K5730 Diverticulosis of large intestine without perforation or abscess without bleeding: Secondary | ICD-10-CM | POA: Diagnosis not present

## 2019-05-01 DIAGNOSIS — Z86010 Personal history of colonic polyps: Secondary | ICD-10-CM | POA: Diagnosis not present

## 2019-05-01 DIAGNOSIS — Z87891 Personal history of nicotine dependence: Secondary | ICD-10-CM | POA: Insufficient documentation

## 2019-05-01 DIAGNOSIS — Z9221 Personal history of antineoplastic chemotherapy: Secondary | ICD-10-CM | POA: Insufficient documentation

## 2019-05-01 DIAGNOSIS — C184 Malignant neoplasm of transverse colon: Secondary | ICD-10-CM

## 2019-05-01 DIAGNOSIS — R918 Other nonspecific abnormal finding of lung field: Secondary | ICD-10-CM | POA: Diagnosis not present

## 2019-05-01 LAB — COMPREHENSIVE METABOLIC PANEL
ALT: 18 U/L (ref 0–44)
AST: 24 U/L (ref 15–41)
Albumin: 4 g/dL (ref 3.5–5.0)
Alkaline Phosphatase: 57 U/L (ref 38–126)
Anion gap: 10 (ref 5–15)
BUN: 15 mg/dL (ref 8–23)
CO2: 26 mmol/L (ref 22–32)
Calcium: 9.2 mg/dL (ref 8.9–10.3)
Chloride: 103 mmol/L (ref 98–111)
Creatinine, Ser: 0.68 mg/dL (ref 0.44–1.00)
GFR calc Af Amer: >60 mL/min (ref >60)
GFR calc non Af Amer: >60 mL/min (ref >60)
Glucose, Bld: 123 mg/dL — ABNORMAL HIGH (ref 70–99)
Potassium: 4.3 mmol/L (ref 3.5–5.1)
Sodium: 139 mmol/L (ref 135–145)
Total Bilirubin: 0.6 mg/dL (ref 0.3–1.2)
Total Protein: 7.6 g/dL (ref 6.5–8.1)

## 2019-05-01 LAB — CBC WITH DIFFERENTIAL/PLATELET
Abs Immature Granulocytes: 0.02 10*3/uL (ref 0.00–0.07)
Basophils Absolute: 0.1 10*3/uL (ref 0.0–0.1)
Basophils Relative: 1 %
Eosinophils Absolute: 0.1 10*3/uL (ref 0.0–0.5)
Eosinophils Relative: 2 %
HCT: 41.3 % (ref 36.0–46.0)
Hemoglobin: 13.1 g/dL (ref 12.0–15.0)
Immature Granulocytes: 0 %
Lymphocytes Relative: 27 %
Lymphs Abs: 1.9 10*3/uL (ref 0.7–4.0)
MCH: 29.8 pg (ref 26.0–34.0)
MCHC: 31.7 g/dL (ref 30.0–36.0)
MCV: 94.1 fL (ref 80.0–100.0)
Monocytes Absolute: 0.7 10*3/uL (ref 0.1–1.0)
Monocytes Relative: 10 %
Neutro Abs: 4.2 10*3/uL (ref 1.7–7.7)
Neutrophils Relative %: 60 %
Platelets: 251 10*3/uL (ref 150–400)
RBC: 4.39 MIL/uL (ref 3.87–5.11)
RDW: 12.5 % (ref 11.5–15.5)
WBC: 6.9 10*3/uL (ref 4.0–10.5)
nRBC: 0 % (ref 0.0–0.2)

## 2019-05-02 LAB — CEA: CEA: 4.1 ng/mL (ref 0.0–4.7)

## 2019-05-07 ENCOUNTER — Other Ambulatory Visit: Payer: Self-pay

## 2019-05-08 ENCOUNTER — Inpatient Hospital Stay (HOSPITAL_BASED_OUTPATIENT_CLINIC_OR_DEPARTMENT_OTHER): Payer: Medicare Other | Admitting: Hematology

## 2019-05-08 VITALS — BP 151/82 | HR 93 | Temp 97.3°F | Resp 16 | Wt 163.4 lb

## 2019-05-08 DIAGNOSIS — Z9221 Personal history of antineoplastic chemotherapy: Secondary | ICD-10-CM | POA: Diagnosis not present

## 2019-05-08 DIAGNOSIS — M549 Dorsalgia, unspecified: Secondary | ICD-10-CM | POA: Diagnosis not present

## 2019-05-08 DIAGNOSIS — Z9049 Acquired absence of other specified parts of digestive tract: Secondary | ICD-10-CM | POA: Diagnosis not present

## 2019-05-08 DIAGNOSIS — C184 Malignant neoplasm of transverse colon: Secondary | ICD-10-CM

## 2019-05-08 DIAGNOSIS — K5730 Diverticulosis of large intestine without perforation or abscess without bleeding: Secondary | ICD-10-CM | POA: Diagnosis not present

## 2019-05-08 DIAGNOSIS — R918 Other nonspecific abnormal finding of lung field: Secondary | ICD-10-CM | POA: Diagnosis not present

## 2019-05-08 DIAGNOSIS — Z85038 Personal history of other malignant neoplasm of large intestine: Secondary | ICD-10-CM | POA: Diagnosis not present

## 2019-05-08 NOTE — Progress Notes (Signed)
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 Colon, biopsy, proximal transverse mass INVASIVE ADENOCARCINOMA.   12/29/2013 Definitive Surgery   Colon, segmental resection for tumor, right - INVASIVE COLORECTAL ADENOCARCINOMA, 7 CM, EXTENDING INTO PERICOLONIC CONNECTIVE TISSUE. - METASTATIC CARCINOMA IN 6 OF 33 LYMPH NODES (6/33).   02/10/2014 - 03/10/2014 Adjuvant Chemotherapy   FOLFOX x 3 cycles   03/11/2014 Adverse Reaction   Patient called reporting diarrhea despite dose reduction and she wants to stop therapy.  Patient seen on 03/24/14 to discuss other treatment options and she stands by her decision to stop all therapy.   04/01/2014 Surgery   Port-a-cath removal by Dr. Arnoldo Morale.   01/12/2015 PET scan   Mild abdominal mesenteric LAD show hypermetabolic activity, 7 mm indeterminate pulm nodule in sup segment of RLL shows no assoc metabolic activity   [id number] PET scan   Mild progression of nodal mets in anterior mid abdominal mesentery, new nodal mets at L thoracic inlet. stable 6 mm nodule in posterior RLL, unchanged since 2015   06/22/2015 PET scan   Resolution of metabolic activity and decreased size of mild LAD at L thoracic inlet, stable mild mid abd hypermetabolic mesenteric LAD, stable 6 mm posterior LLL pulm nodule without metabolic activity   [id number] Imaging   MRI lumbar spine L4-L5 disc degenerated, broad based disc hernation, B facet arthropathy with hypertophy and edema, stenosis of lateral recesses that could cause neural compression   10/18/2016 Imaging   CT CAP- 1. Several small ground-glass pulmonary nodules are  present in the lungs and are stable over the past 9 months, but several are mildly larger than they were in 2015. Low-grade adenocarcinoma can sometimes have this appearance and surveillance is likely warranted. 2. There is a cluster of nodal tissue in the central mesentery, maximum short axis diameter 1.3 cm. This nodal cluster is relatively low in density and not appreciably changed from the prior exam. Given the lack of change is may represent quiescent residua of malignancy, but again, surveillance is likely warranted. 3. The left-sided rib fractures are more numerous than was revealed on the prior radiographs ; there are nondisplaced healing fractures of the left fifth, sixth, seventh, eighth, ninth, and tenth ribs. 4. Mild cardiomegaly although part of this appearance may be due to pectus excavatum. 5. Several additional small pulmonary nodules are stable from 2015 and considered benign. 6. Right hemicolectomy and sigmoid colon diverticulosis. 7. Small right paraumbilical hernia containing adipose tissue. 8. Lower lumbar spondylosis and degenerative disc disease potentially with mild impingement at L4-5.   04/25/2017 Imaging   CT C/A/P: Scattered solid pulmonary nodules measure up to 6 mm in the right lower lobe, unchanged. There are a few scattered ground-glass nodules measuring up to 8 mm in the right upper lobe, also stable. Distal perigastric lymph nodes are stable. No additional evidence of metastatic disease.      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  Nunamaker 73 y.o. female presents today for follow-up.  She reports overall doing well.  Her main concern is back pain secondary to bulging disks.  She does follow with an orthopedic.  She  denies any changes in her bowel habits.  No melena.  Appetite is stable.  No weight loss.  She is here for repeat labs and office visit.   REVIEW OF SYSTEMS:  Review of Systems  Constitutional: Negative.   HENT:  Negative.   Eyes: Negative.   Respiratory: Negative.   Cardiovascular: Negative.   Gastrointestinal: Negative.   Endocrine: Negative.   Genitourinary: Negative.    Musculoskeletal: Positive for arthralgias and back pain.  Skin: Negative.   Neurological: Negative.   Hematological: Negative.   Psychiatric/Behavioral: Negative.      PAST MEDICAL/SURGICAL HISTORY:  Past Medical History:  Diagnosis Date  . Arthritis    wrist and thumbs  . Colon cancer ([place])   . PONV (postoperative nausea and vomiting)   . Ruptured lumbar disc    L1-L5 per patient report  . Sciatica of left side    Past Surgical History:  Procedure Laterality Date  . COLONOSCOPY N/A 12/12/2013   Procedure: COLONOSCOPY;  Surgeon: Rogene Houston, MD;  [facility];  Service: Endoscopy;  Laterality: N/A;  730  . COLONOSCOPY N/A 01/20/2015   Procedure: COLONOSCOPY;  Surgeon: Rogene Houston, MD;  [facility];  Service: Endoscopy;  Laterality: N/A;  1030  . COLONOSCOPY N/A 03/28/2018   Procedure: COLONOSCOPY;  Surgeon: Rogene Houston, MD;  [facility];  Service: Endoscopy;  Laterality: N/A;  1015  . EUS N/A 12/18/2013   Procedure: UPPER ENDOSCOPIC ULTRASOUND (EUS) LINEAR;  Surgeon: Milus Banister, MD;  [facility];  Service: Endoscopy;  Laterality: N/A;  . herniated disc     1996-c5-c7  . PARTIAL COLECTOMY N/A 12/29/2013   Procedure: RIGHT HEMICOLECTOMY;  Surgeon: Jamesetta So, MD;  Location: [facility];  Service: General;  Laterality: N/A;  . POLYPECTOMY  03/28/2018   Procedure: POLYPECTOMY;  Surgeon: Rogene Houston, MD;  [facility];  Service: Endoscopy;;  transverse colon (hot snare);  Marland Kitchen PORT-A-CATH REMOVAL Right 04/01/2014   Procedure: MINOR  REMOVAL PORT-A-CATH;  Surgeon: Jamesetta So, MD;  [facility];  Service: General;  Laterality: Right;  . PORTACATH PLACEMENT Right 02/04/2014   Procedure: INSERTION PORT-A-CATH;  Surgeon: Jamesetta So, MD;  Location: [facility];  Service: General;  Laterality: Right;     SOCIAL HISTORY:  Social History   Socioeconomic History  . Marital status: Married    Spouse name: Not on file  . Number of children: Not on file  . Years of education: Not on file  . Highest education level: Not on file  Occupational History  . Not on file  Social Needs  . Financial resource strain: Not on file  . Food insecurity    Worry: Not on file    Inability: Not on file  . Transportation needs    Medical: Not on file    Non-medical: Not on file  Tobacco Use  . Smoking status: Former Smoker    Packs/day: 0.25    Years: 5.00    Pack years: 1.25    Types: Cigarettes    Quit date: 04/19/1959    Years since quitting: 60.0  . Smokeless tobacco: Never Used  . Tobacco comment: smoked for 5 years as teenager  Substance and Sexual Activity  . Alcohol  use: No  . Drug use: No  . Sexual activity: Yes    Birth control/protection: Post-menopausal  Lifestyle  . Physical activity    Days per week: Not on file    Minutes per session: Not on file  . Stress: Not on file  Relationships  . Social Herbalist on phone: Not on file    Gets together: Not on file    Attends religious service: Not on file    Active member of club or organization: Not on file    Attends meetings of clubs or organizations: Not on file    Relationship status: Not on file  . Intimate partner violence    Fear of current or ex partner: Not on file    Emotionally abused: Not on file    Physically abused: Not on file    Forced sexual activity: Not on file  Other Topics Concern  . Not on file  Social History Narrative  . Not on file    FAMILY HISTORY:  Family History  Problem Relation Age of Onset  . Diabetes type  II Mother   . Dementia Father     CURRENT MEDICATIONS:  Outpatient Encounter Medications as of 05/08/2019  Medication Sig  . aspirin (ASPIRIN EC) 81 MG EC tablet Take 1 tablet (81 mg total) by mouth daily. Swallow whole.  Marland Kitchen Bioflavonoid Products (ESTER C PO) Take 1,000 mg by mouth daily.  . Cholecalciferol (D3-1000) 25 MCG (1000 UT) capsule Take 1,000 Units by mouth daily.   . Cranberry-Vitamin C-Vitamin E 4200-20-3 MG-MG-UNIT CAPS Take 4,200 mg by mouth daily.  . Garlic 123XX123 MG CAPS Take 1,000 mg by mouth daily.   . Multiple Vitamin (MULTIVITAMIN) tablet Take 1 tablet by mouth daily.  . Omega-3 Fatty Acids (FISH OIL) 1200 MG CAPS Take 1,200 mg by mouth daily.   Marland Kitchen acetaminophen (TYLENOL) 500 MG tablet Take 500 mg by mouth every 6 (six) hours as needed for moderate pain or headache.  . cetirizine (ZYRTEC) 10 MG tablet Take 10 mg by mouth as needed.    No facility-administered encounter medications on file as of 05/08/2019.     ALLERGIES:  Allergies  Allergen Reactions  . Sulfa Antibiotics Other (See Comments)    "Sores in my mouth"     PHYSICAL EXAM:  ECOG Performance status: 1  Vitals:   05/08/19 1205  BP: (!) 151/82  Pulse: 93  Resp: 16  Temp: (!) 97.3 F (36.3 C)  SpO2: 94%   Filed Weights   05/08/19 1205  Weight: 163 lb 6.4 oz (74.1 kg)    Physical Exam Constitutional:      Appearance: Normal appearance.  HENT:     Head: Normocephalic.     Right Ear: External ear normal.     Left Ear: External ear normal.     Nose: Nose normal.     Mouth/Throat:     Pharynx: Oropharynx is clear.  Eyes:     Conjunctiva/sclera: Conjunctivae normal.  Neck:     Musculoskeletal: Normal range of motion.  Cardiovascular:     Rate and Rhythm: Normal rate and regular rhythm.     Pulses: Normal pulses.     Heart sounds: Normal heart sounds.  Pulmonary:     Effort: Pulmonary effort is normal.     Breath sounds: Normal breath sounds.  Abdominal:     General: Bowel sounds are  normal.  Musculoskeletal: Normal range of motion.  Skin:    General: Skin  is warm.  Neurological:     General: No focal deficit present.     Mental Status: She is alert and oriented to person, place, and time.  Psychiatric:        Mood and Affect: Mood normal.        Behavior: Behavior normal.        Thought Content: Thought content normal.        Judgment: Judgment normal.      LABORATORY DATA:  I have reviewed the labs as listed.  CBC    Component Value Date/Time   WBC 6.9 05/01/2019 1115   RBC 4.39 05/01/2019 1115   HGB 13.1 05/01/2019 1115   HCT 41.3 05/01/2019 1115   PLT 251 05/01/2019 1115   MCV 94.1 05/01/2019 1115   MCH 29.8 05/01/2019 1115   MCHC 31.7 05/01/2019 1115   RDW 12.5 05/01/2019 1115   LYMPHSABS 1.9 05/01/2019 1115   MONOABS 0.7 05/01/2019 1115   EOSABS 0.1 05/01/2019 1115   BASOSABS 0.1 05/01/2019 1115   CMP Latest Ref Rng & Units 05/01/2019 10/28/2018 04/25/2018  Glucose 70 - 99 mg/dL 123(H) 108(H) 109(H)  BUN 8 - 23 mg/dL 15 19 13   Creatinine 0.44 - 1.00 mg/dL 0.68 0.68 0.79  Sodium 135 - 145 mmol/L 139 138 140  Potassium 3.5 - 5.1 mmol/L 4.3 4.6 3.9  Chloride 98 - 111 mmol/L 103 102 104  CO2 22 - 32 mmol/L 26 27 27   Calcium 8.9 - 10.3 mg/dL 9.2 9.8 9.4  Total Protein 6.5 - 8.1 g/dL 7.6 8.1 7.4  Total Bilirubin 0.3 - 1.2 mg/dL 0.6 0.5 0.6  Alkaline Phos 38 - 126 U/L 57 56 53  AST 15 - 41 U/L 24 22 20   ALT 0 - 44 U/L 18 14 15          ASSESSMENT & PLAN:   Adenocarcinoma of transverse colon ([facility]) 1.  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 - Status post 3 cycles of FOLFOX from 02/10/2014 through 03/10/2014, discontinued secondary to poor tolerance.  DPD was negative. -Last colonoscopy on 03/28/2018 patent ileo-colonic anastomosis.  8 mm polyp in the transverse colon.  Diverticulosis in the  sigmoid colon, descending colon. - CT scan on 11/04/2018 shows postoperative findings of right colectomy with redemonstrated mesenteric nodule/lymph node measuring 1.9 x 1.7 cm, unchanged from prior scan in October 2019.  No evidence of metastatic disease.  Stable solid and groundglass pulmonary nodules, stable over multiple prior exams. -She does not report any change in bowel habits.  She has baseline diarrhea.  No bleeding per rectum reported. -We will repeat CT chest abdomen pelvis in April 2021.  If CT scans remain stable.  We can transition the patient to yearly visits.        Orders placed this encounter:  Orders Placed This Encounter  Procedures  . CT Abdomen Pelvis W Contrast  . CT [place] [phone]

## 2019-05-08 NOTE — Assessment & Plan Note (Signed)
1.  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 - Status post 3 cycles of FOLFOX from 02/10/2014 through 03/10/2014, discontinued secondary to poor tolerance.  DPD was negative. -Last colonoscopy on 03/28/2018 patent ileo-colonic anastomosis.  8 mm polyp in the transverse colon.  Diverticulosis in the sigmoid colon, descending colon. - CT scan on 11/04/2018 shows postoperative findings of right colectomy with redemonstrated mesenteric nodule/lymph node measuring 1.9 x 1.7 cm, unchanged from prior scan in October 2019.  No evidence of metastatic disease.  Stable solid and groundglass pulmonary nodules, stable over multiple prior exams. -She does not report any change in bowel habits.  She has baseline diarrhea.  No bleeding per rectum reported. -We will repeat CT chest abdomen pelvis in April 2021.  If CT scans remain stable.  We can transition the patient to yearly visits.

## 2019-10-25 ENCOUNTER — Ambulatory Visit
Admission: EM | Admit: 2019-10-25 | Discharge: 2019-10-25 | Disposition: A | Discharge status: Home / Self Care | Payer: Medicare Other | Attending: Emergency Medicine | Admitting: Emergency Medicine

## 2019-10-25 ENCOUNTER — Other Ambulatory Visit: Payer: Self-pay

## 2019-10-25 DIAGNOSIS — W57XXXA Bitten or stung by nonvenomous insect and other nonvenomous arthropods, initial encounter: Secondary | ICD-10-CM

## 2019-10-25 DIAGNOSIS — S80862A Insect bite (nonvenomous), left lower leg, initial encounter: Secondary | ICD-10-CM | POA: Diagnosis not present

## 2019-10-25 MED ORDER — DOXYCYCLINE HYCLATE 100 MG PO TABS
200.0000 mg | ORAL_TABLET | Freq: Once | ORAL | Status: AC
  Administered 2019-10-25: 09:00:00 200 mg via ORAL

## 2019-10-25 NOTE — ED Provider Notes (Signed)
[place]   [id number] 10/25/19 Arrival Time: [id number]  CC: Tick bite  SUBJECTIVE:  Amanda Norris is a 74 y.o. female presents for tick bite that occurred a yesterday. States tick was attached for less that 24 hours.  Was able to remove at home.   Localizes the bite to her LT lower leg.  Mildly painful.  Denies fever, chills, nausea, vomiting, headache, dizziness, weakness, fatigue, rash, or abdominal pain.   ROS: As per HPI.  All other pertinent ROS negative.     Past Medical History:  Diagnosis Date  . Arthritis    wrist and thumbs  . Colon cancer ([place])   . PONV (postoperative nausea and vomiting)   . Ruptured lumbar disc    L1-L5 per patient report  . Sciatica of left side    Past Surgical History:  Procedure Laterality Date  . COLONOSCOPY N/A 12/12/2013   Procedure: COLONOSCOPY;  Surgeon: Rogene Houston, MD;  [facility];  Service: Endoscopy;  Laterality: N/A;  730  . COLONOSCOPY N/A 01/20/2015   Procedure: COLONOSCOPY;  Surgeon: Rogene Houston, MD;  [facility];  Service: Endoscopy;  Laterality: N/A;  1030  . COLONOSCOPY N/A 03/28/2018   Procedure: COLONOSCOPY;  Surgeon: Rogene Houston, MD;  [facility];  Service: Endoscopy;  Laterality: N/A;  1015  . EUS N/A 12/18/2013   Procedure: UPPER ENDOSCOPIC ULTRASOUND (EUS) LINEAR;  Surgeon: Milus Banister, MD;  [facility];  Service: Endoscopy;  Laterality: N/A;  . herniated disc     1996-c5-c7  . PARTIAL COLECTOMY N/A 12/29/2013   Procedure: RIGHT HEMICOLECTOMY;  Surgeon: Jamesetta So, MD;  [facility];  Service: General;  Laterality: N/A;  . POLYPECTOMY  03/28/2018   Procedure: POLYPECTOMY;  Surgeon: Rogene Houston, MD;  [facility];  Service: Endoscopy;;  transverse colon (hot snare);  Marland Kitchen PORT-A-CATH REMOVAL Right 04/01/2014   Procedure: MINOR REMOVAL PORT-A-CATH;  Surgeon: Jamesetta So, MD;  [facility];  Service: General;  Laterality: Right;  .  PORTACATH PLACEMENT Right 02/04/2014   Procedure: INSERTION PORT-A-CATH;  Surgeon: Jamesetta So, MD;  [facility];  Service: General;  Laterality: Right;   Allergies  Allergen Reactions  . Sulfa Antibiotics Other (See Comments)    "Sores in my mouth"   No current facility-administered medications on file prior to encounter.   Current Outpatient Medications on File Prior to Encounter  Medication Sig Dispense Refill  . acetaminophen (TYLENOL) 500 MG tablet Take 500 mg by mouth every 6 (six) hours as needed for moderate pain or headache.    Marland Kitchen aspirin (ASPIRIN EC) 81 MG EC tablet Take 1 tablet (81 mg total) by mouth daily. Swallow whole. 30 tablet 12  . Bioflavonoid Products (ESTER C PO) Take 1,000 mg by mouth daily.    . cetirizine (ZYRTEC) 10 MG tablet Take 10 mg by mouth as needed.     . Cholecalciferol (D3-1000) 25 MCG (1000 UT) capsule Take 1,000 Units by mouth daily.     . Cranberry-Vitamin C-Vitamin E 4200-20-3 MG-MG-UNIT CAPS Take 4,200 mg by mouth daily.    . Garlic 123XX123 MG CAPS Take 1,000 mg by mouth daily.     . Multiple Vitamin (MULTIVITAMIN) tablet Take 1 tablet by mouth daily.    . Omega-3 Fatty Acids (FISH OIL) 1200 MG CAPS Take 1,200 mg by mouth daily.      Social History   Socioeconomic History  .  Marital status: Married    Spouse name: Not on file  . Number of children: Not on file  . Years of education: Not on file  . Highest education level: Not on file  Occupational History  . Not on file  Tobacco Use  . Smoking status: Former Smoker    Packs/day: 0.25    Years: 5.00    Pack years: 1.25    Types: Cigarettes    Quit date: 04/19/1959    Years since quitting: 60.5  . Smokeless tobacco: Never Used  . Tobacco comment: smoked for 5 years as teenager  Substance and Sexual Activity  . Alcohol use: No  . Drug use: No  . Sexual activity: Yes    Birth control/protection: Post-menopausal  Other Topics Concern  . Not on file  Social History Narrative  . Not  on file   Social Determinants of Health   Financial Resource Strain:   . Difficulty of Paying Living Expenses:   Food Insecurity:   . Worried About Charity fundraiser in the Last Year:   . Arboriculturist in the Last Year:   Transportation Needs:   . Film/video editor (Medical):   Marland Kitchen Lack of Transportation (Non-Medical):   Physical Activity:   . Days of Exercise per Week:   . Minutes of Exercise per Session:   Stress:   . Feeling of Stress :   Social Connections:   . Frequency of Communication with Friends and Family:   . Frequency of Social Gatherings with Friends and Family:   . Attends Religious Services:   . Active Member of Clubs or Organizations:   . Attends Archivist Meetings:   Marland Kitchen Marital Status:   Intimate Partner Violence:   . Fear of Current or Ex-Partner:   . Emotionally Abused:   Marland Kitchen Physically Abused:   . Sexually Abused:    Family History  Problem Relation Age of Onset  . Diabetes type II Mother   . Dementia Father     OBJECTIVE: Vitals:   10/25/19 0850  BP: (!) 181/78  Pulse: 67  Resp: 16  Temp: 98.7 F (37.1 C)  TempSrc: Oral  SpO2: 97%    General appearance: alert; no distress Head: NCAT Lungs: normal respiratory effort Extremities: no edema Skin: warm and dry; small area of erythema localized to posterior LT calf, apx 3 cm in diameter, with small punctate lesion in the center.  No tick attached Psychological: alert and cooperative; normal mood and affect  ASSESSMENT & PLAN:  1. Tick bite of left lower leg, initial encounter     Meds ordered this encounter  Medications  . doxycycline (VIBRA-TABS) tablet 200 mg   Given single prophylactic dose of doxycycline 200 mg To prevent tick bites, wear long sleeves, long pants, and light colors. Use insect repellent. Follow the instructions on the bottle. If the tick is biting, do not try to remove it with heat, alcohol, petroleum jelly, or fingernail polish. Use tweezers, curved  forceps, or a tick-removal tool to grasp the tick. Gently pull up until the tick lets go. Do not twist or jerk the tick. Do not squeeze or crush the tick. Return here or go to ER if you have any new or worsening symptoms (rash, nausea, vomiting, fever, chills, headache, fatigue)  Reviewed expectations re: course of current medical issues. Questions answered. Outlined signs and symptoms indicating need for more acute intervention. Patient verbalized understanding. After Visit Summary given.   [place], [place],  PA-C 10/25/19 0901

## 2019-10-25 NOTE — ED Triage Notes (Signed)
Pt found tick on lower leg calf yesterday.  Now swollen, red, and it itches.

## 2019-10-25 NOTE — Discharge Instructions (Signed)
Given single prophylactic dose of doxycycline 200 mg To prevent tick bites, wear long sleeves, long pants, and light colors. Use insect repellent. Follow the instructions on the bottle. If the tick is biting, do not try to remove it with heat, alcohol, petroleum jelly, or fingernail polish. Use tweezers, curved forceps, or a tick-removal tool to grasp the tick. Gently pull up until the tick lets go. Do not twist or jerk the tick. Do not squeeze or crush the tick. Return here or go to ER if you have any new or worsening symptoms (rash, nausea, vomiting, fever, chills, headache, fatigue)

## 2019-11-03 ENCOUNTER — Other Ambulatory Visit (HOSPITAL_COMMUNITY): Payer: Medicare Other

## 2019-11-06 ENCOUNTER — Ambulatory Visit (HOSPITAL_COMMUNITY): Payer: Medicare Other | Admitting: Nurse Practitioner

## 2020-01-26 DIAGNOSIS — E663 Overweight: Secondary | ICD-10-CM | POA: Diagnosis not present

## 2020-01-26 DIAGNOSIS — Z0000 Encounter for general adult medical examination without abnormal findings: Secondary | ICD-10-CM | POA: Diagnosis not present

## 2020-01-26 DIAGNOSIS — E039 Hypothyroidism, unspecified: Secondary | ICD-10-CM | POA: Diagnosis not present

## 2020-01-26 DIAGNOSIS — Z6828 Body mass index (BMI) 28.0-28.9, adult: Secondary | ICD-10-CM | POA: Diagnosis not present

## 2020-01-26 DIAGNOSIS — Z1389 Encounter for screening for other disorder: Secondary | ICD-10-CM | POA: Diagnosis not present

## 2020-01-26 DIAGNOSIS — C189 Malignant neoplasm of colon, unspecified: Secondary | ICD-10-CM | POA: Diagnosis not present

## 2020-01-26 DIAGNOSIS — E7849 Other hyperlipidemia: Secondary | ICD-10-CM | POA: Diagnosis not present

## 2020-03-11 DIAGNOSIS — Z23 Encounter for immunization: Secondary | ICD-10-CM | POA: Diagnosis not present

## 2020-04-08 DIAGNOSIS — Z23 Encounter for immunization: Secondary | ICD-10-CM | POA: Diagnosis not present

## 2020-08-17 ENCOUNTER — Ambulatory Visit (HOSPITAL_COMMUNITY)
Admission: RE | Admit: 2020-08-17 | Discharge: 2020-08-17 | Disposition: A | Discharge status: Home / Self Care | Payer: Medicare Other | Source: Ambulatory Visit | Attending: Internal Medicine | Admitting: Internal Medicine

## 2020-08-17 ENCOUNTER — Other Ambulatory Visit: Payer: Self-pay

## 2020-08-17 ENCOUNTER — Other Ambulatory Visit (HOSPITAL_COMMUNITY): Payer: Self-pay | Admitting: Internal Medicine

## 2020-08-17 DIAGNOSIS — Z6826 Body mass index (BMI) 26.0-26.9, adult: Secondary | ICD-10-CM | POA: Diagnosis not present

## 2020-08-17 DIAGNOSIS — K529 Noninfective gastroenteritis and colitis, unspecified: Secondary | ICD-10-CM | POA: Diagnosis not present

## 2020-08-17 DIAGNOSIS — R109 Unspecified abdominal pain: Secondary | ICD-10-CM

## 2020-08-17 DIAGNOSIS — Z1389 Encounter for screening for other disorder: Secondary | ICD-10-CM | POA: Diagnosis not present

## 2020-08-17 DIAGNOSIS — C189 Malignant neoplasm of colon, unspecified: Secondary | ICD-10-CM | POA: Diagnosis not present

## 2020-08-18 DIAGNOSIS — R197 Diarrhea, unspecified: Secondary | ICD-10-CM | POA: Diagnosis not present

## 2020-08-18 DIAGNOSIS — Z1389 Encounter for screening for other disorder: Secondary | ICD-10-CM | POA: Diagnosis not present

## 2020-08-26 ENCOUNTER — Ambulatory Visit (INDEPENDENT_AMBULATORY_CARE_PROVIDER_SITE_OTHER): Payer: Medicare Other | Admitting: Gastroenterology

## 2020-08-26 ENCOUNTER — Encounter (INDEPENDENT_AMBULATORY_CARE_PROVIDER_SITE_OTHER): Payer: Self-pay | Admitting: Gastroenterology

## 2020-08-26 ENCOUNTER — Other Ambulatory Visit: Payer: Self-pay

## 2020-08-26 VITALS — BP 157/79 | HR 96 | Temp 98.6°F | Ht 64.0 in | Wt 156.0 lb

## 2020-08-26 DIAGNOSIS — R197 Diarrhea, unspecified: Secondary | ICD-10-CM

## 2020-08-26 DIAGNOSIS — Z85038 Personal history of other malignant neoplasm of large intestine: Secondary | ICD-10-CM

## 2020-08-26 DIAGNOSIS — K9089 Other intestinal malabsorption: Secondary | ICD-10-CM

## 2020-08-26 DIAGNOSIS — K529 Noninfective gastroenteritis and colitis, unspecified: Secondary | ICD-10-CM

## 2020-08-26 MED ORDER — DICYCLOMINE HCL 10 MG PO CAPS
10.0000 mg | ORAL_CAPSULE | Freq: Two times a day (BID) | ORAL | 1 refills | Status: DC | PRN
Start: 2020-08-26 — End: 2020-09-22

## 2020-08-26 NOTE — Progress Notes (Signed)
Maylon Peppers, M.D. Gastroenterology & Hepatology [facility] [address] [place], [place] [address]  Primary Care Physician: Redmond School, MD [address] [place] [address]  I will communicate my assessment and recommendations to the referring MD via EMR.  Problems: 1. Chronic diarrhea  History of Present Illness: Amanda Norris is a 75 y.o. female With past medical history of colon cancer status post resection and FOLFOX x3 cycles, who presents for follow up of chronic diarrhea.  Patient states for the last 2 months she has presented multiple episodes of watery to soft bowel movements, between 6-8 times per day. These episodes happen even during the night, waking her up. There is not food trigger, but usually is worse after having any meal. She states she used to have 2-3 BMs per day before her symptoms started. Has had only one fecal soiling accident as she was away from home and did not make it to the bathroom. Had a similar episode of persistent diarrhea after she had colon surgery but it resolved on its own. She also reports more recently having mid abdominal pain constantly describes it as a pressure and persistent soreness.  She was prescribed Lomotil for the diarrhea episodes but even though she was taking the medication every 6 hours it only decreased the frequency to 4 times a day and it also was slightly more formed, so she decided to stop the medication. She has only been taking Tylenol for the pain which actually helps to some point. She has a good appettite but she has decreased her food intake to avoid more episodes of diarrhea. Has lost 10 lb since her symptoms started due to this.  The patient denies having any nausea, vomiting, fever, chills, hematochezia, melena, hematemesis, abdominal distention, jaundice, pruritus or weight loss.  Most recent cross-sectional abdominal imaging on 11/04/2018, underwent CT  chest, abdomen and pelvis with IV contrast which showed postoperative findings of right hemicolectomy, presence of mesenteric nodule measuring 1.9 x 1.7 cm but no evidence of new metastatic disease, also pulmonary nodules, no pancreatic lesions.   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  She underwent 3 cycles of FOLFOX but discontinued due to poor tolerance and did not undergo any other chemotherapy regimen. Last CEA was 4.6 on 10/28/2018. Patient last saw Dr. Delton Coombes on 11/06/2018 and was supposed to follow-up with him to repeat CEA levels and abdominal scans at that time.   Last EGD/EUS 2015 -  1. Cystic lesion in head of pancreas, measuring 1.48cm maximally. There were thin internal septea but no associated solid masses or nodules.  The cyst did not clearly communicate with the main pancreatic duct. 2. The vague area in anterior pancreatic head described on recent CT was not visible on this exam; the pancreas, other than the cyst described above, was normal. 3. Main pancreatic duct was normal; non-dilated 4. No peripancreatic adenopathy 5. Limited views of liver, spleen, portal and splenic vessels were all normal. Last Colonoscopy: 03/28/2018, normal terminal ileum, found to have patent end-to-side anastomosis, an 8 mm polyp was removed from the transverse colon with pathology consistent with SSL, diverticulosis, hemorrhoids  FHx: neg for any gastrointestinal/liver disease, no malignancies Social: neg smoking, alcohol or illicit drug use  Past Medical History: Past Medical History:  Diagnosis Date  . Arthritis    wrist and thumbs  . Colon cancer ([place])   . PONV (postoperative nausea and vomiting)   . Ruptured lumbar disc    L1-L5 per patient report  . Sciatica of left side      Past Surgical History: Past Surgical History:  Procedure Laterality Date  . COLONOSCOPY N/A 12/12/2013   Procedure: COLONOSCOPY;  Surgeon: Rogene Houston, MD;  [facility];  Service: Endoscopy;  Laterality: N/A;  730  . COLONOSCOPY N/A 01/20/2015   Procedure: COLONOSCOPY;  Surgeon: Rogene Houston, MD;  [facility]: [facility];  Service: Endoscopy;  Laterality: N/A;  1030  . COLONOSCOPY N/A 03/28/2018   Procedure: COLONOSCOPY;  Surgeon: Rogene Houston, MD;  [facility];  Service: Endoscopy;  Laterality: N/A;  1015  . EUS N/A 12/18/2013   Procedure: UPPER ENDOSCOPIC ULTRASOUND (EUS) LINEAR;  Surgeon: Milus Banister, MD;  Location: [facility];  Service: Endoscopy;  Laterality: N/A;  . herniated disc     [id number]  . PARTIAL COLECTOMY N/A 12/29/2013   Procedure: RIGHT HEMICOLECTOMY;  Surgeon: Jamesetta So, MD;  Location: [facility];  Service: General;  Laterality: N/A;  . POLYPECTOMY  03/28/2018   Procedure: POLYPECTOMY;  Surgeon: Rogene Houston, MD;  [facility];  Service: Endoscopy;;  transverse colon (hot snare);  Marland Kitchen PORT-A-CATH REMOVAL Right 04/01/2014   Procedure: MINOR REMOVAL PORT-A-CATH;  Surgeon: Jamesetta So, MD;  Location: [facility];  Service: General;  Laterality: Right;  . PORTACATH PLACEMENT Right 02/04/2014   Procedure: INSERTION PORT-A-CATH;  Surgeon: Jamesetta So, MD;  Location: [facility];  Service: General;  Laterality: Right;    Family History: Family History  Problem Relation Age of Onset  . Diabetes type II Mother   . Dementia Father     Social History: Social History   Tobacco Use  Smoking Status Former Smoker  . Packs/day: 0.25  . Years: 5.00  . Pack years: 1.25  . Types: Cigarettes  . Quit date: 04/19/1959  . Years since quitting: 61.3  Smokeless Tobacco Never Used  Tobacco Comment   smoked for 5 years as teenager   Social History   Substance and Sexual Activity  Alcohol Use No   Social History   Substance  and Sexual Activity  Drug Use No    Allergies: Allergies  Allergen Reactions  . Sulfa Antibiotics Other (See Comments)    "Sores in my mouth"    Medications: Current Outpatient Medications  Medication Sig Dispense Refill  . acetaminophen (TYLENOL) 500 MG tablet Take 500 mg by mouth every 6 (six) hours as needed for moderate pain or headache.    Marland Kitchen aspirin 81 MG EC tablet Take 1 tablet (81 mg total) by mouth daily. Swallow whole. 30 tablet 12  . Bioflavonoid Products (ESTER C PO) Take 1,000 mg by mouth daily.    . Cholecalciferol (D3-1000) 25 MCG (1000 UT) capsule Take 125 Units by mouth daily.    . Cranberry-Vitamin C-Vitamin E 4200-20-3 MG-MG-UNIT CAPS Take 500 mg by mouth daily.    . diphenoxylate-atropine (LOMOTIL) 2.5-0.025 MG tablet Take by mouth 4 (four) times daily as needed for diarrhea or loose stools.    . Garlic 0981 MG CAPS Take 1,000 mg by mouth daily.     Marland Kitchen LACTOBACILLUS PO Take 1 tablet by mouth daily.    . Multiple Vitamin (MULTIVITAMIN) tablet Take 1 tablet by mouth daily.    . Omega-3 Fatty  Acids (FISH OIL) 1200 MG CAPS Take 1,000 mg by mouth daily.     No current facility-administered medications for this visit.    Review of Systems: GENERAL: negative for malaise, night sweats HEENT: No changes in hearing or vision, no nose bleeds or other nasal problems. NECK: Negative for lumps, goiter, pain and significant neck swelling RESPIRATORY: Negative for cough, wheezing CARDIOVASCULAR: Negative for chest pain, leg swelling, palpitations, orthopnea GI: SEE HPI MUSCULOSKELETAL: Negative for joint pain or swelling, back pain, and muscle pain. SKIN: Negative for lesions, rash PSYCH: Negative for sleep disturbance, mood disorder and recent psychosocial stressors. HEMATOLOGY Negative for prolonged bleeding, bruising easily, and swollen nodes. ENDOCRINE: Negative for cold or heat intolerance, polyuria, polydipsia and goiter. NEURO: negative for tremor, gait imbalance,  syncope and seizures. The remainder of the review of systems is noncontributory.   Physical Exam: BP (!) 157/79 (BP Location: Left Arm, Patient Position: Sitting, Cuff Size: Large)   Pulse 96   Temp 98.6 F (37 C) (Oral)   Ht 5\' 4"  (1.626 m)   Wt 156 lb (70.8 kg)   BMI 26.78 kg/m  GENERAL: The patient is AO x3, in no acute distress. HEENT: Head is normocephalic and atraumatic. EOMI are intact. Mouth is well hydrated and without lesions. NECK: Supple. No masses LUNGS: Clear to auscultation. No presence of rhonchi/wheezing/rales. Adequate chest expansion HEART: RRR, normal s1 and s2. ABDOMEN: mild tenderness upon palpation of the periumbilical area, no guarding, no peritoneal signs, and nondistended. BS +. No masses. EXTREMITIES: Without any cyanosis, clubbing, rash, lesions or edema. NEUROLOGIC: AOx3, no focal motor deficit. SKIN: no jaundice, no rashes  Imaging/Labs: as above  I personally reviewed and interpreted the available labs, imaging and endoscopic files.  Impression and Plan: Amanda Norris is a 75 y.o. female With past medical history of colon cancer status post resection and FOLFOX x3 cycles, who presents for follow up of chronic diarrhea.  Patient has presented recurrent episodes of diarrhea on a daily basis without clear explanation.  She has not been taking any medication or supplement that could be leading to these new symptoms.  She has presented some weight loss due to food avoidance as it worsens her bowel movement frequency.  At this moment, it would be important to check for infectious causes, she had previous stool testing by her PCP but these results are not available to me today.  We will request these tests to determine if further sleep testing is warranted.  Other etiologies such as celiac disease, SIBO and electrolyte abnormalities will be checked as well today, will check CBC, CMP, TTG IgA, TSH and B12/folate levels. Will also check a CEA level given her prior  history of CRC, needs to follow with Dr. Delton Coombes. Finally, advised the patient to take the Lomotil as needed as it clinically was helping her having less bowel movements.  I will also prescribe Bentyl to take as needed when she has recurrent abdominal pain.  Ultimately, if her investigations are negative, we will need to proceed with a repeat colonoscopy to performed random colonic biopsies.  -Will request stool testing result from Dr. Gerarda Fraction -Check CBC, CMP, TTG IgA, TSH and B12/folate levels -Can continue Lomotil up to 4 pills per day to improve diarrhea -May need to perform colonoscopy if workup is unremarkable -RTC 3 months  All questions were answered.      Harvel Quale, MD Gastroenterology and Hepatology [facility]

## 2020-08-26 NOTE — Patient Instructions (Addendum)
Will request stool testing result from Dr. Gerarda Fraction, if we have not received them in 2 weeks please reach them so they can fax them or you can bring these Perform blood workup Can continue Lomotil up to 4 pills per day to improve diarrhea May need to perform colonoscopy if workup is unremarkable

## 2020-08-27 LAB — CBC WITH DIFFERENTIAL/PLATELET
Absolute Monocytes: 540 cells/uL (ref 200–950)
Basophils Absolute: 53 cells/uL (ref 0–200)
Basophils Relative: 0.7 %
Eosinophils Absolute: 98 cells/uL (ref 15–500)
Eosinophils Relative: 1.3 %
HCT: 41.5 % (ref 35.0–45.0)
Hemoglobin: 13.8 g/dL (ref 11.7–15.5)
Lymphs Abs: 1485 cells/uL (ref 850–3900)
MCH: 29.8 pg (ref 27.0–33.0)
MCHC: 33.3 g/dL (ref 32.0–36.0)
MCV: 89.6 fL (ref 80.0–100.0)
MPV: 10.2 fL (ref 7.5–12.5)
Monocytes Relative: 7.2 %
Neutro Abs: 5325 cells/uL (ref 1500–7800)
Neutrophils Relative %: 71 %
Platelets: 286 10*3/uL (ref 140–400)
RBC: 4.63 10*6/uL (ref 3.80–5.10)
RDW: 12.4 % (ref 11.0–15.0)
Total Lymphocyte: 19.8 %
WBC: 7.5 10*3/uL (ref 3.8–10.8)

## 2020-08-27 LAB — COMPREHENSIVE METABOLIC PANEL
AG Ratio: 1.5 (calc) (ref 1.0–2.5)
ALT: 13 U/L (ref 6–29)
AST: 19 U/L (ref 10–35)
Albumin: 4.6 g/dL (ref 3.6–5.1)
Alkaline phosphatase (APISO): 61 U/L (ref 37–153)
BUN: 12 mg/dL (ref 7–25)
CO2: 27 mmol/L (ref 20–32)
Calcium: 9.6 mg/dL (ref 8.6–10.4)
Chloride: 104 mmol/L (ref 98–110)
Creat: 0.68 mg/dL (ref 0.60–0.93)
Globulin: 3 g/dL (calc) (ref 1.9–3.7)
Glucose, Bld: 98 mg/dL (ref 65–99)
Potassium: 4.1 mmol/L (ref 3.5–5.3)
Sodium: 139 mmol/L (ref 135–146)
Total Bilirubin: 0.3 mg/dL (ref 0.2–1.2)
Total Protein: 7.6 g/dL (ref 6.1–8.1)

## 2020-08-27 LAB — TISSUE TRANSGLUTAMINASE, IGA: (tTG) Ab, IgA: <1 U/mL

## 2020-08-27 LAB — CEA: CEA: 7.7 ng/mL — ABNORMAL HIGH

## 2020-08-27 LAB — TSH: TSH: 1.81 mIU/L (ref 0.40–4.50)

## 2020-08-27 LAB — IGA: Immunoglobulin A: 334 mg/dL — ABNORMAL HIGH (ref 70–320)

## 2020-08-27 LAB — B12 AND FOLATE PANEL
Folate: 22.1 ng/mL
Vitamin B-12: 313 pg/mL (ref 200–1100)

## 2020-08-30 ENCOUNTER — Other Ambulatory Visit (HOSPITAL_COMMUNITY): Payer: Self-pay

## 2020-08-30 DIAGNOSIS — K8689 Other specified diseases of pancreas: Secondary | ICD-10-CM

## 2020-08-30 DIAGNOSIS — C184 Malignant neoplasm of transverse colon: Secondary | ICD-10-CM

## 2020-08-30 NOTE — Progress Notes (Signed)
ct 

## 2020-09-16 ENCOUNTER — Other Ambulatory Visit (HOSPITAL_COMMUNITY): Payer: Self-pay

## 2020-09-16 DIAGNOSIS — C184 Malignant neoplasm of transverse colon: Secondary | ICD-10-CM

## 2020-09-17 ENCOUNTER — Inpatient Hospital Stay (HOSPITAL_COMMUNITY): Payer: Medicare Other | Attending: Hematology

## 2020-09-17 ENCOUNTER — Other Ambulatory Visit: Payer: Self-pay

## 2020-09-17 DIAGNOSIS — Z9049 Acquired absence of other specified parts of digestive tract: Secondary | ICD-10-CM | POA: Diagnosis not present

## 2020-09-17 DIAGNOSIS — Z780 Asymptomatic menopausal state: Secondary | ICD-10-CM | POA: Insufficient documentation

## 2020-09-17 DIAGNOSIS — Z87891 Personal history of nicotine dependence: Secondary | ICD-10-CM | POA: Insufficient documentation

## 2020-09-17 DIAGNOSIS — R634 Abnormal weight loss: Secondary | ICD-10-CM | POA: Insufficient documentation

## 2020-09-17 DIAGNOSIS — R630 Anorexia: Secondary | ICD-10-CM | POA: Insufficient documentation

## 2020-09-17 DIAGNOSIS — Z9221 Personal history of antineoplastic chemotherapy: Secondary | ICD-10-CM | POA: Insufficient documentation

## 2020-09-17 DIAGNOSIS — R197 Diarrhea, unspecified: Secondary | ICD-10-CM | POA: Insufficient documentation

## 2020-09-17 DIAGNOSIS — Z85038 Personal history of other malignant neoplasm of large intestine: Secondary | ICD-10-CM | POA: Insufficient documentation

## 2020-09-17 DIAGNOSIS — C184 Malignant neoplasm of transverse colon: Secondary | ICD-10-CM

## 2020-09-17 LAB — COMPREHENSIVE METABOLIC PANEL
ALT: 14 U/L (ref 0–44)
AST: 21 U/L (ref 15–41)
Albumin: 3.9 g/dL (ref 3.5–5.0)
Alkaline Phosphatase: 66 U/L (ref 38–126)
Anion gap: 8 (ref 5–15)
BUN: 14 mg/dL (ref 8–23)
CO2: 26 mmol/L (ref 22–32)
Calcium: 9.6 mg/dL (ref 8.9–10.3)
Chloride: 103 mmol/L (ref 98–111)
Creatinine, Ser: 0.68 mg/dL (ref 0.44–1.00)
GFR, Estimated: >60 mL/min (ref >60)
Glucose, Bld: 98 mg/dL (ref 70–99)
Potassium: 4 mmol/L (ref 3.5–5.1)
Sodium: 137 mmol/L (ref 135–145)
Total Bilirubin: 0.3 mg/dL (ref 0.3–1.2)
Total Protein: 7.4 g/dL (ref 6.5–8.1)

## 2020-09-17 LAB — CBC WITH DIFFERENTIAL/PLATELET
Abs Immature Granulocytes: 0.02 10*3/uL (ref 0.00–0.07)
Basophils Absolute: 0.1 10*3/uL (ref 0.0–0.1)
Basophils Relative: 1 %
Eosinophils Absolute: 0.2 10*3/uL (ref 0.0–0.5)
Eosinophils Relative: 3 %
HCT: 40.6 % (ref 36.0–46.0)
Hemoglobin: 12.9 g/dL (ref 12.0–15.0)
Immature Granulocytes: 0 %
Lymphocytes Relative: 22 %
Lymphs Abs: 1.7 10*3/uL (ref 0.7–4.0)
MCH: 29.9 pg (ref 26.0–34.0)
MCHC: 31.8 g/dL (ref 30.0–36.0)
MCV: 94.2 fL (ref 80.0–100.0)
Monocytes Absolute: 0.8 10*3/uL (ref 0.1–1.0)
Monocytes Relative: 11 %
Neutro Abs: 4.8 10*3/uL (ref 1.7–7.7)
Neutrophils Relative %: 63 %
Platelets: 232 10*3/uL (ref 150–400)
RBC: 4.31 MIL/uL (ref 3.87–5.11)
RDW: 12.9 % (ref 11.5–15.5)
WBC: 7.5 10*3/uL (ref 4.0–10.5)
nRBC: 0 % (ref 0.0–0.2)

## 2020-09-18 LAB — CEA: CEA: 21.4 ng/mL — ABNORMAL HIGH (ref 0.0–4.7)

## 2020-09-21 ENCOUNTER — Ambulatory Visit (HOSPITAL_COMMUNITY)
Admission: RE | Admit: 2020-09-21 | Discharge: 2020-09-21 | Disposition: A | Discharge status: Home / Self Care | Payer: Medicare Other | Source: Ambulatory Visit | Attending: Hematology | Admitting: Hematology

## 2020-09-21 DIAGNOSIS — K7689 Other specified diseases of liver: Secondary | ICD-10-CM | POA: Diagnosis not present

## 2020-09-21 DIAGNOSIS — K5750 Diverticulosis of both small and large intestine without perforation or abscess without bleeding: Secondary | ICD-10-CM | POA: Diagnosis not present

## 2020-09-21 DIAGNOSIS — I862 Pelvic varices: Secondary | ICD-10-CM | POA: Diagnosis not present

## 2020-09-21 DIAGNOSIS — K8689 Other specified diseases of pancreas: Secondary | ICD-10-CM | POA: Insufficient documentation

## 2020-09-21 DIAGNOSIS — C189 Malignant neoplasm of colon, unspecified: Secondary | ICD-10-CM | POA: Diagnosis not present

## 2020-09-21 DIAGNOSIS — C184 Malignant neoplasm of transverse colon: Secondary | ICD-10-CM | POA: Diagnosis not present

## 2020-09-21 MED ORDER — IOHEXOL 300 MG/ML  SOLN
100.0000 mL | Freq: Once | INTRAMUSCULAR | Status: AC | PRN
  Administered 2020-09-21: 100 mL via INTRAVENOUS

## 2020-09-22 ENCOUNTER — Inpatient Hospital Stay (HOSPITAL_BASED_OUTPATIENT_CLINIC_OR_DEPARTMENT_OTHER): Payer: Medicare Other | Admitting: Hematology

## 2020-09-22 ENCOUNTER — Other Ambulatory Visit: Payer: Self-pay

## 2020-09-22 VITALS — BP 149/66 | HR 84 | Temp 98.2°F | Resp 16 | Wt 156.2 lb

## 2020-09-22 DIAGNOSIS — C189 Malignant neoplasm of colon, unspecified: Secondary | ICD-10-CM | POA: Diagnosis not present

## 2020-09-22 DIAGNOSIS — C787 Secondary malignant neoplasm of liver and intrahepatic bile duct: Secondary | ICD-10-CM

## 2020-09-22 DIAGNOSIS — Z9221 Personal history of antineoplastic chemotherapy: Secondary | ICD-10-CM | POA: Diagnosis not present

## 2020-09-22 DIAGNOSIS — C184 Malignant neoplasm of transverse colon: Secondary | ICD-10-CM | POA: Diagnosis not present

## 2020-09-22 DIAGNOSIS — R634 Abnormal weight loss: Secondary | ICD-10-CM | POA: Diagnosis not present

## 2020-09-22 DIAGNOSIS — R630 Anorexia: Secondary | ICD-10-CM | POA: Diagnosis not present

## 2020-09-22 DIAGNOSIS — Z85038 Personal history of other malignant neoplasm of large intestine: Secondary | ICD-10-CM | POA: Diagnosis not present

## 2020-09-22 DIAGNOSIS — Z9049 Acquired absence of other specified parts of digestive tract: Secondary | ICD-10-CM | POA: Diagnosis not present

## 2020-09-22 DIAGNOSIS — R197 Diarrhea, unspecified: Secondary | ICD-10-CM | POA: Diagnosis not present

## 2020-09-22 NOTE — Patient Instructions (Signed)
[place] at [facility] Discharge Instructions  You were seen today by Dr. Delton Coombes. He went over your recent results and scans. There are several concerning spots visible on your CT scan which have a high likelihood of your cancer returning. You will be referred to interventional radiology to have a biopsy done of your liver. You will also be scheduled to have a CT scan of your chest before your next visit. Take Imodium three to four times daily to control your diarrhea. Dr. Delton Coombes will see you back after the biopsy results for follow up.   Thank you for choosing [place] at [facility] to provide your oncology and hematology care.  To afford each patient quality time with our provider, please arrive at least 15 minutes before your scheduled appointment time.   If you have a lab appointment with the [facility] please come in thru the Main Entrance and check in at the main information desk  You need to re-schedule your appointment should you arrive 10 or more minutes late.  We strive to give you quality time with our providers, and arriving late affects you and other patients whose appointments are after yours.  Also, if you no show three or more times for appointments you may be dismissed from the clinic at the providers discretion.     Again, thank you for choosing [facility].  Our hope is that these requests will decrease the amount of time that you wait before being seen by our physicians.       _____________________________________________________________  Should you have questions after your visit to [facility], please contact our office at [phone] between the hours of 8:00 a.m. and 4:30 p.m.  Voicemails left after 4:00 p.m. will not be returned until the following business day.  For prescription refill requests, have your pharmacy contact our office and allow 72 hours.    Cancer Center Support  Programs:   > Cancer Support Group  2nd Tuesday of the month 1pm-2pm, Journey Room

## 2020-09-22 NOTE — Progress Notes (Signed)
Amanda Norris, [place] [id number]   CLINIC:  Medical Oncology/Hematology  PCP:  [facility], [place] [address] [place] [address] [phone]   REASON FOR VISIT:  Follow-up for transverse colon adenocarcinoma  PRIOR THERAPY:  1. Colectomy on 12/29/2013. 2. FOLFOX x 3 cycles from 02/10/2014 to 03/10/2014, stopped secondary to poor tolerance.  NGS Results: Not done  CURRENT THERAPY: Surveillance  BRIEF ONCOLOGIC HISTORY:  Oncology History  Adenocarcinoma of transverse colon ([place]) (Resolved)  12/09/2013 Initial Diagnosis   1. Colon, biopsy, proximal transverse mass INVASIVE ADENOCARCINOMA.   12/29/2013 Definitive Surgery   Colon, segmental resection for tumor, right - INVASIVE COLORECTAL ADENOCARCINOMA, 7 CM, EXTENDING INTO PERICOLONIC CONNECTIVE TISSUE. - METASTATIC CARCINOMA IN 6 OF 33 LYMPH NODES (6/33).   02/10/2014 - 03/10/2014 Adjuvant Chemotherapy   FOLFOX x 3 cycles   03/11/2014 Adverse Reaction   Patient called reporting diarrhea despite dose reduction and she wants to stop therapy.  Patient seen on 03/24/14 to discuss other treatment options and she stands by her decision to stop all therapy.   04/01/2014 Surgery   Port-a-cath removal by Dr. Arnoldo Morale.   01/12/2015 PET scan   Mild abdominal mesenteric LAD show hypermetabolic activity, 7 mm indeterminate pulm nodule in sup segment of RLL shows no assoc metabolic activity   01/03/9484 PET scan   Mild progression of nodal mets in anterior mid abdominal mesentery, new nodal mets at L thoracic inlet. stable 6 mm nodule in posterior RLL, unchanged since 2015   06/22/2015 PET scan   Resolution of metabolic activity and decreased size of mild LAD at L thoracic inlet, stable mild mid abd hypermetabolic mesenteric LAD, stable 6 mm posterior LLL pulm nodule without metabolic activity   4/62/7035 Imaging   MRI lumbar spine L4-L5 disc degenerated, broad based disc hernation, B facet  arthropathy with hypertophy and edema, stenosis of lateral recesses that could cause neural compression   10/18/2016 Imaging   CT CAP- 1. Several small ground-glass pulmonary nodules are present in the lungs and are stable over the past 9 months, but several are mildly larger than they were in 2015. Low-grade adenocarcinoma can sometimes have this appearance and surveillance is likely warranted. 2. There is a cluster of nodal tissue in the central mesentery, maximum short axis diameter 1.3 cm. This nodal cluster is relatively low in density and not appreciably changed from the prior exam. Given the lack of change is may represent quiescent residua of malignancy, but again, surveillance is likely warranted. 3. The left-sided rib fractures are more numerous than was revealed on the prior radiographs ; there are nondisplaced healing fractures of the left fifth, sixth, seventh, eighth, ninth, and tenth ribs. 4. Mild cardiomegaly although part of this appearance may be due to pectus excavatum. 5. Several additional small pulmonary nodules are stable from 2015 and considered benign. 6. Right hemicolectomy and sigmoid colon diverticulosis. 7. Small right paraumbilical hernia containing adipose tissue. 8. Lower lumbar spondylosis and degenerative disc disease potentially with mild impingement at L4-5.   04/25/2017 Imaging   CT C/A/P: Scattered solid pulmonary nodules measure up to 6 mm in the right lower lobe, unchanged. There are a few scattered ground-glass nodules measuring up to 8 mm in the right upper lobe, also stable. Distal perigastric lymph nodes are stable. No additional evidence of metastatic disease.     CANCER STAGING: Cancer Staging No matching staging information was found for the patient.  INTERVAL HISTORY:  Ms. Amanda  AMARIONA Norris, a 75 y.o. female, returns for routine follow-up of her transverse colon adenocarcinoma. Amanda was last seen by Reynolds Bowl, NP, on 05/08/2019.   Today  she is accompanied by her husband and she reports feeling poorly. She complains of having diarrhea for the past 2 months, about 6 times per day and sometimes during the night. She kept a food diary to see if she could eliminate certain foods and she denies relation to dairy products or any other correlation. At baseline she had 2 BM's per day. She has lost about 8 lbs in the past 2 months since her appetite has come down, mainly due to diarrhea. The diarrhea comes on shortly after eating. She takes Imodium once to twice per day, which helps delay the onset of diarrhea. She denies regurgitation or vomiting. She notes having SOB with exertion and hard heart beating. She has not taken antibiotics in the past 6 months. She notes that when she was taking chemo, she felt like she was dying, so she stopped. She has numbness in the bottom of her left foot due to back issues, but nowhere else.  She denies having family history of cancer. She briefly smoked in her teenage years.   REVIEW OF SYSTEMS:  Review of Systems  Constitutional: Positive for appetite change (50%) and fatigue (depleted).  Respiratory: Positive for shortness of breath (w/ exertion).   Cardiovascular: Positive for palpitations.  Gastrointestinal: Positive for diarrhea (up to 6 episodes per day; past 2 months). Negative for vomiting.  Neurological: Positive for numbness (left plantar foot).  All other systems reviewed and are negative.   PAST MEDICAL/SURGICAL HISTORY:  Past Medical History:  Diagnosis Date  . Arthritis    wrist and thumbs  . Colon cancer ([place])   . PONV (postoperative nausea and vomiting)   . Ruptured lumbar disc    L1-L5 per patient report  . Sciatica of left side    Past Surgical History:  Procedure Laterality Date  . COLONOSCOPY N/A 12/12/2013   Procedure: COLONOSCOPY;  Surgeon: Rogene Houston, MD;  [facility];  Service: Endoscopy;  Laterality: N/A;  730  . COLONOSCOPY N/A 01/20/2015   Procedure:  COLONOSCOPY;  Surgeon: Rogene Houston, MD;  [facility];  Service: Endoscopy;  Laterality: N/A;  1030  . COLONOSCOPY N/A 03/28/2018   Procedure: COLONOSCOPY;  Surgeon: Rogene Houston, MD;  [facility];  Service: Endoscopy;  Laterality: N/A;  1015  . EUS N/A 12/18/2013   Procedure: UPPER ENDOSCOPIC ULTRASOUND (EUS) LINEAR;  Surgeon: Milus Banister, MD;  Location: [facility];  Service: Endoscopy;  Laterality: N/A;  . herniated disc     [id number]  . PARTIAL COLECTOMY N/A 12/29/2013   Procedure: RIGHT HEMICOLECTOMY;  Surgeon: Jamesetta So, MD;  Location: [facility];  Service: General;  Laterality: N/A;  . POLYPECTOMY  03/28/2018   Procedure: POLYPECTOMY;  Surgeon: Rogene Houston, MD;  [facility];  Service: Endoscopy;;  transverse colon (hot snare);  Marland Kitchen PORT-A-CATH REMOVAL Right 04/01/2014   Procedure: MINOR REMOVAL PORT-A-CATH;  Surgeon: Jamesetta So, MD;  Location: [facility];  Service: General;  Laterality: Right;  . PORTACATH PLACEMENT Right 02/04/2014   Procedure: INSERTION PORT-A-CATH;  Surgeon: Jamesetta So, MD;  Location: [facility];  Service: General;  Laterality: Right;    SOCIAL HISTORY:  Social History   Socioeconomic History  . Marital status: Married    Spouse name: Not on file  . Number  of children: Not on file  . Years of education: Not on file  . Highest education level: Not on file  Occupational History  . Not on file  Tobacco Use  . Smoking status: Former Smoker    Packs/day: 0.25    Years: 5.00    Pack years: 1.25    Types: Cigarettes    Quit date: 04/19/1959    Years since quitting: 61.4  . Smokeless tobacco: Never Used  . Tobacco comment: smoked for 5 years as teenager  Substance and Sexual Activity  . Alcohol use: No  . Drug use: No  . Sexual activity: Yes    Birth control/protection: Post-menopausal  Other Topics Concern  . Not on file  Social History Narrative  . Not on file   Social Determinants of Health    Financial Resource Strain: Not on file  Food Insecurity: Not on file  Transportation Needs: Not on file  Physical Activity: Not on file  Stress: Not on file  Social Connections: Not on file  Intimate Partner Violence: Not on file    FAMILY HISTORY:  Family History  Problem Relation Age of Onset  . Diabetes type II Mother   . Dementia Father     CURRENT MEDICATIONS:  Current Outpatient Medications  Medication Sig Dispense Refill  . acetaminophen (TYLENOL) 500 MG tablet Take 500 mg by mouth every 6 (six) hours as needed for moderate pain or headache.    Marland Kitchen aspirin 81 MG EC tablet Take 1 tablet (81 mg total) by mouth daily. Swallow whole. 30 tablet 12  . Bioflavonoid Products (ESTER C PO) Take 1,000 mg by mouth daily.    . Cholecalciferol (D3-1000) 25 MCG (1000 UT) capsule Take 125 Units by mouth daily.    . Cranberry-Vitamin C-Vitamin E 4200-20-3 MG-MG-UNIT CAPS Take 500 mg by mouth daily.    . Garlic 4081 MG CAPS Take 1,000 mg by mouth daily.     Marland Kitchen LACTOBACILLUS PO Take 1 tablet by mouth daily.    . Multiple Vitamin (MULTIVITAMIN) tablet Take 1 tablet by mouth daily.    . Omega-3 Fatty Acids (FISH OIL) 1200 MG CAPS Take 1,000 mg by mouth daily.     No current facility-administered medications for this visit.    ALLERGIES:  Allergies  Allergen Reactions  . Sulfa Antibiotics Other (See Comments)    "Sores in my mouth"    PHYSICAL EXAM:  Performance status (ECOG): 1 - Symptomatic but completely ambulatory  Vitals:   09/22/20 0817  BP: (!) 149/66  Pulse: 84  Resp: 16  Temp: 98.2 F (36.8 C)  SpO2: 100%   Wt Readings from Last 3 Encounters:  09/22/20 156 lb 3.2 oz (70.9 kg)  08/26/20 156 lb (70.8 kg)  05/08/19 163 lb 6.4 oz (74.1 kg)   Physical Exam Vitals reviewed.  Constitutional:      Appearance: Normal appearance.  Cardiovascular:     Rate and Rhythm: Normal rate and regular rhythm.     Pulses: Normal pulses.     Heart sounds: Normal heart sounds.   Pulmonary:     Effort: Pulmonary effort is normal.     Breath sounds: Normal breath sounds.  Chest:  Breasts:     Right: No axillary adenopathy or supraclavicular adenopathy.     Left: No axillary adenopathy or supraclavicular adenopathy.    Abdominal:     Palpations: Abdomen is soft. There is no hepatomegaly, splenomegaly or mass.     Tenderness: There is no abdominal tenderness.  Hernia: No hernia is present.  Musculoskeletal:     Right lower leg: No edema.     Left lower leg: No edema.  Lymphadenopathy:     Cervical: No cervical adenopathy.     Upper Body:     Right upper body: No supraclavicular, axillary or pectoral adenopathy.     Left upper body: No supraclavicular, axillary or pectoral adenopathy.     Lower Body: No right inguinal adenopathy. No left inguinal adenopathy.  Neurological:     General: No focal deficit present.     Mental Status: She is alert and oriented to person, place, and time.  Psychiatric:        Mood and Affect: Mood normal.        Behavior: Behavior normal.      LABORATORY DATA:  I have reviewed the labs as listed.  CBC Latest Ref Rng & Units 09/17/2020 08/26/2020 05/01/2019  WBC 4.0 - 10.5 K/uL 7.5 7.5 6.9  Hemoglobin 12.0 - 15.0 g/dL 12.9 13.8 13.1  Hematocrit 36.0 - 46.0 % 40.6 41.5 41.3  Platelets 150 - 400 K/uL 232 286 251   CMP Latest Ref Rng & Units 09/17/2020 08/26/2020 05/01/2019  Glucose 70 - 99 mg/dL 98 98 123(H)  BUN 8 - 23 mg/dL 14 12 15   Creatinine 0.44 - 1.00 mg/dL 0.68 0.68 0.68  Sodium 135 - 145 mmol/L 137 139 139  Potassium 3.5 - 5.1 mmol/L 4.0 4.1 4.3  Chloride 98 - 111 mmol/L 103 104 103  CO2 22 - 32 mmol/L 26 27 26   Calcium 8.9 - 10.3 mg/dL 9.6 9.6 9.2  Total Protein 6.5 - 8.1 g/dL 7.4 7.6 7.6  Total Bilirubin 0.3 - 1.2 mg/dL 0.3 0.3 0.6  Alkaline Phos 38 - 126 U/L 66 - 57  AST 15 - 41 U/L 21 19 24   ALT 0 - 44 U/L 14 13 18    Lab Results  Component Value Date   CEA1 21.4 (H) 09/17/2020   CEA1 4.1 05/01/2019    CEA1 4.6 10/28/2018    DIAGNOSTIC IMAGING:  I have independently reviewed the scans and discussed with the patient. CT Abdomen Pelvis W Contrast  Result Date: 09/21/2020 CLINICAL DATA:  Colorectal cancer, status post transverse colon resection EXAM: CT ABDOMEN AND PELVIS WITH CONTRAST TECHNIQUE: Multidetector CT imaging of the abdomen and pelvis was performed using the standard protocol following bolus administration of intravenous contrast. CONTRAST:  180m OMNIPAQUE IOHEXOL 300 MG/ML SOLN, additional oral enteric contrast COMPARISON:  11/04/2018 FINDINGS: Lower chest: There are numerous new small pulmonary nodules throughout the included lung bases, measuring up to 5 mm (series 6, image 29). There are newly enlarged retrocrural and periaortic lymph nodes in the included lower chest measuring up to 2.6 x 2.0 cm (series 3, image 26). Hepatobiliary: There is a new hypodense lesion of the anterior right lobe of the liver measuring 1.4 x 1.0 cm (series 3, image 40). No gallstones, gallbladder wall thickening, or biliary dilatation. Pancreas: Unremarkable. No pancreatic ductal dilatation or surrounding inflammatory changes. Spleen: Normal in size without significant abnormality. Adrenals/Urinary Tract: Adrenal glands are unremarkable. Kidneys are normal, without renal calculi, solid lesion, or hydronephrosis. Bladder is unremarkable. Stomach/Bowel: Stomach is within normal limits. Redemonstrated postoperative findings of partial right colectomy. A soft tissue nodule within the central mesentery in the ventral abdomen has substantially increased in size, now encasing the proximal superior mesenteric artery and measuring 5.0 x 4.3 cm, previously 1.9 x 1.7 cm (series 3, image 63). Sigmoid diverticulosis. Vascular/Lymphatic:  No significant vascular findings are present. Numerous retroperitoneal lymph nodes, largest aortocaval node measuring 2.8 x 2.2 cm (series 3, image 54). Reproductive: Prominent bilateral uterine  and adnexal varices. Other: No abdominal wall hernia or abnormality. No abdominopelvic ascites. Musculoskeletal: No acute or significant osseous findings. IMPRESSION: 1. Redemonstrated postoperative findings of partial right colectomy. 2. A soft tissue nodule within the central mesentery in the ventral abdomen has substantially increased in size, now encasing the proximal superior mesenteric artery and measuring 5.0 x 4.3 cm, previously 1.9 x 1.7 cm. 3. Numerous new small pulmonary nodules throughout the included lung bases, measuring up to 5 mm. 4. There is a new hypodense lesion of the anterior right lobe of the liver measuring 1.4 x 1.0 cm. 5. Numerous newly enlarged retrocrural and periaortic lymph nodes in the included lower chest as well as numerous newly enlarged retroperitoneal lymph nodes. 6. Above constellation of findings is consistent with worsened nodal metastatic disease in addition to new pulmonary and hepatic metastatic disease. Electronically Signed   By: Eddie Candle M.D.   On: 09/21/2020 09:21     ASSESSMENT:  1.  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 - Status post 3 cycles of FOLFOX from 02/10/2014 through 03/10/2014, discontinued secondary to poor tolerance.  DPD was negative. -Last colonoscopy on 03/28/2018 patent ileo-colonic anastomosis.  8 mm polyp in the transverse colon.  Diverticulosis in the sigmoid colon, descending colon. - CT scan on 11/04/2018 shows postoperative findings of right colectomy with redemonstrated mesenteric nodule/lymph node measuring 1.9 x 1.7 cm, unchanged from prior scan in October 2019.  No evidence of metastatic disease.  Stable solid and groundglass pulmonary nodules, stable over multiple prior exams. -CEA was 4.6 on 10/28/2018.   2.  Health maintenance: -Mammogram dated 05/16/2018 was BI-RADS Category  1.   PLAN:  1.  Metastatic colon cancer to the liver, lungs and lymph nodes: -She was last seen by [place] in 2020 and lost to follow-up. -She was recently evaluated by Dr. Jenetta Downer for diarrhea and was referred back to [place]. -Her CEA was 7.7 on 08/26/2020. -We discussed the results and images of the CTAP from 09/21/2020 which showed numerous small pulmonary nodules throughout lung bases, 5 mm.  Enlarged retrocrural periaortic lymph node in the lower chest.  New hypodense lesion in the anterior right lobe of the liver measuring 1.4 x 1 cm.  Soft tissue nodule within the central mesentery and the ventral abdomen has substantially increased in size, encasing the proximal superior mesenteric artery and measuring 5 x 4.3 cm.  Numerous retroperitoneal lymph nodes, largest aortocaval node measuring 2.8 x 2.2 cm. -We have repeated labs on 09/17/2020 which showed CEA elevated to 21.4. -Recommend CT-guided biopsy of the liver lesion. -Also recommend CT of the chest with contrast to complete staging work-up. -RTC after biopsy.  Will send tumor for MMR/MSI testing and next generation sequencing.  2.  Diarrhea: -She reported new onset diarrhea for the last couple of months.  Previously she had 2 bowel movements per day since surgery.  She is having up to 6 watery stools per day.  She is taking Imodium 1 tablet/day.  She could not tolerate Lomotil as it made her drowsy. -She lost about 8 to 10 pounds in the last few months. -She reportedly had stool studies done with Dr. Delice Bison office and was told that nothing was abnormal. -She was prescribed  probiotic by Dr. Jenetta Downer. -I have told her to take Imodium 3 times a day as a standing medication.  If she gets too constipated, she will cut it back to twice daily.    Orders placed this encounter:  Orders Placed This Encounter  Procedures  . CT Chest W Contrast  . CT BIOPSY   Total time spent 40 minutes, with more than 50% of the time spent face-to-face discussing  scan results, reviewing records, counseling and coordination of care.  Derek Jack, MD [place] [phone]   I, Milinda Antis, am acting as a scribe for Dr. Sanda Linger.  I, Derek Jack MD, have reviewed the above documentation for accuracy and completeness, and I agree with the above.

## 2020-09-23 ENCOUNTER — Encounter (HOSPITAL_COMMUNITY): Payer: Self-pay

## 2020-09-23 NOTE — Progress Notes (Signed)
    [place] Female, 75 y.o., [birth year]  MRN:  [id number] Phone:  [phone] Jerilynn Mages)       PCP:  [facility], MD Primary Cvg:  Medicare/Medicare Part A And B  Next Appt With Radiology ([facility]CT 1) 09/27/2020 at 9:00 AM           RE: Biopsy Received: Yesterday  Message Details  Arne Cleveland, MD  Lenore Cordia Ok   US biopsy liver lesion  See CT Im 40 Se 3   DDH    Previous Messages  ----- Message -----  From: Lenore Cordia  Sent: 09/22/2020 11:28 AM EDT  To: [facility] Procedure Requests  Subject: Biopsy                      Procedure Requested: CT Biopsy    Reason for Procedure: Liver Mets   Provider Requesting: Derek Jack  Provider Telephone: [phone]    Other Info: Adenocarcinoma of transverse colon [facility])  Metastatic colon cancer to liver ([place])

## 2020-09-26 ENCOUNTER — Other Ambulatory Visit: Payer: Self-pay | Admitting: Radiology

## 2020-09-27 ENCOUNTER — Ambulatory Visit (HOSPITAL_COMMUNITY)
Admission: RE | Admit: 2020-09-27 | Discharge: 2020-09-27 | Disposition: A | Discharge status: Home / Self Care | Payer: Medicare Other | Source: Ambulatory Visit | Attending: Hematology | Admitting: Hematology

## 2020-09-27 ENCOUNTER — Encounter (HOSPITAL_COMMUNITY): Payer: Self-pay | Admitting: Radiology

## 2020-09-27 DIAGNOSIS — C787 Secondary malignant neoplasm of liver and intrahepatic bile duct: Secondary | ICD-10-CM | POA: Diagnosis not present

## 2020-09-27 DIAGNOSIS — C184 Malignant neoplasm of transverse colon: Secondary | ICD-10-CM | POA: Insufficient documentation

## 2020-09-27 DIAGNOSIS — I700 Atherosclerosis of aorta: Secondary | ICD-10-CM | POA: Diagnosis not present

## 2020-09-27 DIAGNOSIS — R590 Localized enlarged lymph nodes: Secondary | ICD-10-CM | POA: Diagnosis not present

## 2020-09-27 DIAGNOSIS — C189 Malignant neoplasm of colon, unspecified: Secondary | ICD-10-CM | POA: Insufficient documentation

## 2020-09-27 DIAGNOSIS — R911 Solitary pulmonary nodule: Secondary | ICD-10-CM | POA: Diagnosis not present

## 2020-09-27 DIAGNOSIS — R918 Other nonspecific abnormal finding of lung field: Secondary | ICD-10-CM | POA: Diagnosis not present

## 2020-09-27 MED ORDER — IOHEXOL 300 MG/ML  SOLN
75.0000 mL | Freq: Once | INTRAMUSCULAR | Status: AC | PRN
  Administered 2020-09-27: 75 mL via INTRAVENOUS

## 2020-09-28 ENCOUNTER — Ambulatory Visit (HOSPITAL_COMMUNITY)
Admission: RE | Admit: 2020-09-28 | Discharge: 2020-09-28 | Disposition: A | Discharge status: Home / Self Care | Payer: Medicare Other | Source: Ambulatory Visit | Attending: Hematology | Admitting: Hematology

## 2020-09-28 ENCOUNTER — Other Ambulatory Visit: Payer: Self-pay

## 2020-09-28 ENCOUNTER — Encounter (HOSPITAL_COMMUNITY): Payer: Self-pay

## 2020-09-28 DIAGNOSIS — Z87891 Personal history of nicotine dependence: Secondary | ICD-10-CM | POA: Diagnosis not present

## 2020-09-28 DIAGNOSIS — M1990 Unspecified osteoarthritis, unspecified site: Secondary | ICD-10-CM | POA: Insufficient documentation

## 2020-09-28 DIAGNOSIS — I700 Atherosclerosis of aorta: Secondary | ICD-10-CM | POA: Insufficient documentation

## 2020-09-28 DIAGNOSIS — Z85038 Personal history of other malignant neoplasm of large intestine: Secondary | ICD-10-CM | POA: Diagnosis not present

## 2020-09-28 DIAGNOSIS — C184 Malignant neoplasm of transverse colon: Secondary | ICD-10-CM

## 2020-09-28 DIAGNOSIS — K769 Liver disease, unspecified: Secondary | ICD-10-CM | POA: Diagnosis not present

## 2020-09-28 DIAGNOSIS — M5136 Other intervertebral disc degeneration, lumbar region: Secondary | ICD-10-CM | POA: Insufficient documentation

## 2020-09-28 DIAGNOSIS — C189 Malignant neoplasm of colon, unspecified: Secondary | ICD-10-CM | POA: Diagnosis not present

## 2020-09-28 DIAGNOSIS — R590 Localized enlarged lymph nodes: Secondary | ICD-10-CM | POA: Diagnosis not present

## 2020-09-28 DIAGNOSIS — Z7982 Long term (current) use of aspirin: Secondary | ICD-10-CM | POA: Diagnosis not present

## 2020-09-28 DIAGNOSIS — K7689 Other specified diseases of liver: Secondary | ICD-10-CM | POA: Diagnosis not present

## 2020-09-28 DIAGNOSIS — C787 Secondary malignant neoplasm of liver and intrahepatic bile duct: Secondary | ICD-10-CM

## 2020-09-28 DIAGNOSIS — I898 Other specified noninfective disorders of lymphatic vessels and lymph nodes: Secondary | ICD-10-CM | POA: Insufficient documentation

## 2020-09-28 DIAGNOSIS — Z9049 Acquired absence of other specified parts of digestive tract: Secondary | ICD-10-CM | POA: Diagnosis not present

## 2020-09-28 LAB — CBC WITH DIFFERENTIAL/PLATELET
Abs Immature Granulocytes: 0.04 10*3/uL (ref 0.00–0.07)
Basophils Absolute: 0.1 10*3/uL (ref 0.0–0.1)
Basophils Relative: 1 %
Eosinophils Absolute: 0.1 10*3/uL (ref 0.0–0.5)
Eosinophils Relative: 1 %
HCT: 42.3 % (ref 36.0–46.0)
Hemoglobin: 13.7 g/dL (ref 12.0–15.0)
Immature Granulocytes: 1 %
Lymphocytes Relative: 20 %
Lymphs Abs: 1.5 10*3/uL (ref 0.7–4.0)
MCH: 30 pg (ref 26.0–34.0)
MCHC: 32.4 g/dL (ref 30.0–36.0)
MCV: 92.6 fL (ref 80.0–100.0)
Monocytes Absolute: 0.7 10*3/uL (ref 0.1–1.0)
Monocytes Relative: 9 %
Neutro Abs: 5.3 10*3/uL (ref 1.7–7.7)
Neutrophils Relative %: 68 %
Platelets: 236 10*3/uL (ref 150–400)
RBC: 4.57 MIL/uL (ref 3.87–5.11)
RDW: 13.2 % (ref 11.5–15.5)
WBC: 7.6 10*3/uL (ref 4.0–10.5)
nRBC: 0 % (ref 0.0–0.2)

## 2020-09-28 LAB — COMPREHENSIVE METABOLIC PANEL
ALT: 16 U/L (ref 0–44)
AST: 22 U/L (ref 15–41)
Albumin: 4.2 g/dL (ref 3.5–5.0)
Alkaline Phosphatase: 76 U/L (ref 38–126)
Anion gap: 11 (ref 5–15)
BUN: 10 mg/dL (ref 8–23)
CO2: 25 mmol/L (ref 22–32)
Calcium: 9.6 mg/dL (ref 8.9–10.3)
Chloride: 105 mmol/L (ref 98–111)
Creatinine, Ser: 0.52 mg/dL (ref 0.44–1.00)
GFR, Estimated: >60 mL/min (ref >60)
Glucose, Bld: 109 mg/dL — ABNORMAL HIGH (ref 70–99)
Potassium: 4 mmol/L (ref 3.5–5.1)
Sodium: 141 mmol/L (ref 135–145)
Total Bilirubin: 0.8 mg/dL (ref 0.3–1.2)
Total Protein: 8.2 g/dL — ABNORMAL HIGH (ref 6.5–8.1)

## 2020-09-28 LAB — PROTIME-INR
INR: 1 (ref 0.8–1.2)
Prothrombin Time: 13 seconds (ref 11.4–15.2)

## 2020-09-28 MED ORDER — MIDAZOLAM HCL 2 MG/2ML IJ SOLN
INTRAMUSCULAR | Status: AC | PRN
  Administered 2020-09-28 (×2): 1 mg via INTRAVENOUS

## 2020-09-28 MED ORDER — GELATIN ABSORBABLE 12-7 MM EX MISC
CUTANEOUS | Status: AC
  Filled 2020-09-28: qty 1

## 2020-09-28 MED ORDER — LIDOCAINE HCL (PF) 1 % IJ SOLN
INTRAMUSCULAR | Status: AC | PRN
  Administered 2020-09-28: 10 mL via INTRADERMAL

## 2020-09-28 MED ORDER — SODIUM CHLORIDE 0.9 % IV SOLN: INTRAVENOUS | Status: DC

## 2020-09-28 MED ORDER — MIDAZOLAM HCL 2 MG/2ML IJ SOLN
INTRAMUSCULAR | Status: AC
  Filled 2020-09-28: qty 2

## 2020-09-28 MED ORDER — FENTANYL CITRATE (PF) 100 MCG/2ML IJ SOLN
INTRAMUSCULAR | Status: AC
  Filled 2020-09-28: qty 2

## 2020-09-28 MED ORDER — FENTANYL CITRATE (PF) 100 MCG/2ML IJ SOLN
INTRAMUSCULAR | Status: AC | PRN
  Administered 2020-09-28 (×2): 50 ug via INTRAVENOUS

## 2020-09-28 MED ORDER — LIDOCAINE HCL 1 % IJ SOLN
INTRAMUSCULAR | Status: AC
  Filled 2020-09-28: qty 20

## 2020-09-28 NOTE — Consult Note (Signed)
Chief Complaint: Patient was seen in consultation today for image guided liver lesion biopsy  Referring Physician(s): Katragadda,Sreedhar  Supervising Physician: Mir, Sharen Heck  Patient Status: [name] - Out-[facility]  History of Present Illness: Amanda Norris is a 75 y.o. female with past medical history of arthritis, degenerative disc disease, and colon cancer diagnosed in 2015 with prior right hemicolectomy and chemotherapy.  CEA on 09/17/2020 is 21.4 up from 4.11 in 2020.  Follow-up imaging this month has revealed: 1 . Redemonstrated postoperative findings of partial right colectomy. 2. A soft tissue nodule within the central mesentery in the ventral abdomen has substantially increased in size, now encasing the proximal superior mesenteric artery and measuring 5.0 x 4.3 cm, previously 1.9 x 1.7 cm. 3. Numerous new small pulmonary nodules throughout the included lung bases, measuring up to 5 mm. 4. There is a new hypodense lesion of the anterior right lobe of the liver measuring 1.4 x 1.0 cm. 5. Numerous newly enlarged retrocrural and periaortic lymph nodes in the included lower chest as well as numerous newly enlarged retroperitoneal lymph nodes. 6. Above constellation of findings is consistent with worsened nodal metastatic disease in addition to new pulmonary and hepatic metastatic disease.  1. Findings of metastatic disease in the chest with LEFT thoracic inlet lymph node, mediastinal lymph nodes and multiple pulmonary nodules in addition to abdominal findings which were outlined in the recent comparison study. 2. Aortic atherosclerosis.  She presents today for image guided liver lesion biopsy for further evaluation.    Past Medical History:  Diagnosis Date  . Arthritis    wrist and thumbs  . Colon cancer ([place])   . PONV (postoperative nausea and vomiting)   . Ruptured lumbar disc    L1-L5 per patient report  . Sciatica of left side     Past Surgical History:  Procedure  Laterality Date  . COLONOSCOPY N/A 12/12/2013   Procedure: COLONOSCOPY;  Surgeon: Rogene Houston, MD;  Location: [facility];  Service: Endoscopy;  Laterality: N/A;  730  . COLONOSCOPY N/A 01/20/2015   Procedure: COLONOSCOPY;  Surgeon: Rogene Houston, MD;  Location: [facility];  Service: Endoscopy;  Laterality: N/A;  1030  . COLONOSCOPY N/A 03/28/2018   Procedure: COLONOSCOPY;  Surgeon: Rogene Houston, MD;  Location: [facility];  Service: Endoscopy;  Laterality: N/A;  1015  . EUS N/A 12/18/2013   Procedure: UPPER ENDOSCOPIC ULTRASOUND (EUS) LINEAR;  Surgeon: Milus Banister, MD;  Location: [facility];  Service: Endoscopy;  Laterality: N/A;  . herniated disc     [id number]  . PARTIAL COLECTOMY N/A 12/29/2013   Procedure: RIGHT HEMICOLECTOMY;  Surgeon: Jamesetta So, MD;  Location: [facility];  Service: General;  Laterality: N/A;  . POLYPECTOMY  03/28/2018   Procedure: POLYPECTOMY;  Surgeon: Rogene Houston, MD;  Location: [facility];  Service: Endoscopy;;  transverse colon (hot snare);  Marland Kitchen PORT-A-CATH REMOVAL Right 04/01/2014   Procedure: MINOR REMOVAL PORT-A-CATH;  Surgeon: Jamesetta So, MD;  Location: [facility];  Service: General;  Laterality: Right;  . PORTACATH PLACEMENT Right 02/04/2014   Procedure: INSERTION PORT-A-CATH;  Surgeon: Jamesetta So, MD;  Location: [facility];  Service: General;  Laterality: Right;    Allergies: Sulfa antibiotics  Medications: Prior to Admission medications   Medication Sig Start Date End Date Taking? Authorizing Provider  acetaminophen (TYLENOL) 500 MG tablet Take 500 mg by mouth every 6 (six) hours as needed for moderate pain or headache.   Yes  [provider]  aspirin 81 MG EC tablet Take 1 tablet (81 mg total) by mouth daily. Swallow whole. 04/04/18  Yes Rehman, Mechele Dawley, MD  Bioflavonoid Products (ESTER C PO) Take 1,000 mg by mouth daily.   Yes [provider]  Cholecalciferol (D3-1000) 25 MCG (1000 UT) capsule Take 125 Units by  mouth daily.   Yes [provider]  Cranberry-Vitamin C-Vitamin E 4200-20-3 MG-MG-UNIT CAPS Take 500 mg by mouth daily.   Yes [provider]  Garlic 5852 MG CAPS Take 1,000 mg by mouth daily.    Yes [provider]  LACTOBACILLUS PO Take 1 tablet by mouth daily.   Yes [provider]  Multiple Vitamin (MULTIVITAMIN) tablet Take 1 tablet by mouth daily.   Yes [provider]  Omega-3 Fatty Acids (FISH OIL) 1200 MG CAPS Take 1,000 mg by mouth daily.   Yes [provider]     Family History  Problem Relation Age of Onset  . Diabetes type II Mother   . Dementia Father     Social History   Socioeconomic History  . Marital status: Married    Spouse name: Not on file  . Number of children: Not on file  . Years of education: Not on file  . Highest education level: Not on file  Occupational History  . Not on file  Tobacco Use  . Smoking status: Former Smoker    Packs/day: 0.25    Years: 5.00    Pack years: 1.25    Types: Cigarettes    Quit date: 04/19/1959    Years since quitting: 61.4  . Smokeless tobacco: Never Used  . Tobacco comment: smoked for 5 years as teenager  Substance and Sexual Activity  . Alcohol use: No  . Drug use: No  . Sexual activity: Yes    Birth control/protection: Post-menopausal  Other Topics Concern  . Not on file  Social History Narrative  . Not on file   Social Determinants of Health   Financial Resource Strain: Not on file  Food Insecurity: Not on file  Transportation Needs: Not on file  Physical Activity: Not on file  Stress: Not on file  Social Connections: Not on file      Review of Systems currently denies fever, chest pain, dyspnea, cough, nausea, vomiting or bleeding.  She does have occasional headaches, chronic back pain and some mid abdominal discomfort.  Vital Signs: BP (!) 168/85   Pulse 90   Temp 98.4 F (36.9 C) (Oral)   Resp 20   SpO2 100%   Physical Exam awake,  alert.  Chest clear to auscultation bilaterally.  Heart with regular rate and rhythm.  Abdomen soft, positive bowel sounds, some mid abdominal tenderness to palpation.  No lower extremity edema.  Imaging: CT Chest W Contrast  Result Date: 09/27/2020 CLINICAL DATA:  Incidental nodules on CT of the abdomen and pelvis in the lung bases. EXAM: CT CHEST WITH CONTRAST TECHNIQUE: Multidetector CT imaging of the chest was performed during intravenous contrast administration. CONTRAST:  43mL OMNIPAQUE IOHEXOL 300 MG/ML  SOLN COMPARISON:  November 04, 2018, CT of the chest and abdomen and pelvis CT dated September 21, 2020 FINDINGS: Cardiovascular: Heart size is normal. No pericardial effusion. No aneurysmal dilation of the thoracic aorta no significant aortic atherosclerosis. Central pulmonary vasculature is unremarkable. Mediastinum/Nodes: LEFT thoracic inlet lymph node measures approximately 2.3 cm short axis (image 19, series 2) Retrocrural lymph nodes with enlargement and low posterior mediastinal lymph node adjacent  to the esophageal and aortic hiatus with similar appearance to recent abdominal CT, largest approximately 2.6 cm short axis. No axillary lymphadenopathy. No hilar adenopathy. Borderline enlarged lymph nodes in the chest elsewhere, in particular a superior mediastinal lymph node on image 45 of series 2 measuring approximately 11 mm. Lungs/Pleura: Numerous bilateral pulmonary nodules with distribution that could be seen in the setting of metastatic disease. LEFT and RIGHT lung are involved. Nodules are present throughout the chest between 20 and 25 nodules are present in the LEFT lower lobe alone. (Image 131, series 4) 6 mm LEFT lower lobe pulmonary nodule with similar size adjacent pulmonary nodules on this single image. (Image 129, series 4) 6-7 mm RIGHT lower lobe pulmonary nodule and numerous other nodules throughout the LEFT and RIGHT chest. Largest in the RIGHT upper lobe at 9 mm. No acute cardiopulmonary  process. Upper Abdomen: Medial segment LEFT hepatic lobe lesion 13 mm similar to very recent comparison study. No acute upper abdominal process. Musculoskeletal: Spinal degenerative changes. No acute bone finding. No destructive bone process. IMPRESSION: 1. Findings of metastatic disease in the chest with LEFT thoracic inlet lymph node, mediastinal lymph nodes and multiple pulmonary nodules in addition to abdominal findings which were outlined in the recent comparison study. 2. Aortic atherosclerosis. Aortic Atherosclerosis (ICD10-I70.0). Electronically Signed   By: Zetta Bills M.D.   On: 09/27/2020 13:01   CT Abdomen Pelvis W Contrast  Result Date: 09/21/2020 CLINICAL DATA:  Colorectal cancer, status post transverse colon resection EXAM: CT ABDOMEN AND PELVIS WITH CONTRAST TECHNIQUE: Multidetector CT imaging of the abdomen and pelvis was performed using the standard protocol following bolus administration of intravenous contrast. CONTRAST:  148mL OMNIPAQUE IOHEXOL 300 MG/ML SOLN, additional oral enteric contrast COMPARISON:  11/04/2018 FINDINGS: Lower chest: There are numerous new small pulmonary nodules throughout the included lung bases, measuring up to 5 mm (series 6, image 29). There are newly enlarged retrocrural and periaortic lymph nodes in the included lower chest measuring up to 2.6 x 2.0 cm (series 3, image 26). Hepatobiliary: There is a new hypodense lesion of the anterior right lobe of the liver measuring 1.4 x 1.0 cm (series 3, image 40). No gallstones, gallbladder wall thickening, or biliary dilatation. Pancreas: Unremarkable. No pancreatic ductal dilatation or surrounding inflammatory changes. Spleen: Normal in size without significant abnormality. Adrenals/Urinary Tract: Adrenal glands are unremarkable. Kidneys are normal, without renal calculi, solid lesion, or hydronephrosis. Bladder is unremarkable. Stomach/Bowel: Stomach is within normal limits. Redemonstrated postoperative findings of  partial right colectomy. A soft tissue nodule within the central mesentery in the ventral abdomen has substantially increased in size, now encasing the proximal superior mesenteric artery and measuring 5.0 x 4.3 cm, previously 1.9 x 1.7 cm (series 3, image 63). Sigmoid diverticulosis. Vascular/Lymphatic: No significant vascular findings are present. Numerous retroperitoneal lymph nodes, largest aortocaval node measuring 2.8 x 2.2 cm (series 3, image 54). Reproductive: Prominent bilateral uterine and adnexal varices. Other: No abdominal wall hernia or abnormality. No abdominopelvic ascites. Musculoskeletal: No acute or significant osseous findings. IMPRESSION: 1. Redemonstrated postoperative findings of partial right colectomy. 2. A soft tissue nodule within the central mesentery in the ventral abdomen has substantially increased in size, now encasing the proximal superior mesenteric artery and measuring 5.0 x 4.3 cm, previously 1.9 x 1.7 cm. 3. Numerous new small pulmonary nodules throughout the included lung bases, measuring up to 5 mm. 4. There is a new hypodense lesion of the anterior right lobe of the liver measuring 1.4 x 1.0 cm.  5. Numerous newly enlarged retrocrural and periaortic lymph nodes in the included lower chest as well as numerous newly enlarged retroperitoneal lymph nodes. 6. Above constellation of findings is consistent with worsened nodal metastatic disease in addition to new pulmonary and hepatic metastatic disease. Electronically Signed   By: Eddie Candle M.D.   On: 09/21/2020 09:21    Labs:  CBC: Recent Labs    08/26/20 0944 09/17/20 1018 09/28/20 1112  WBC 7.5 7.5 7.6  HGB 13.8 12.9 13.7  HCT 41.5 40.6 42.3  PLT 286 232 236    COAGS: Recent Labs    09/28/20 1112  INR 1.0    BMP: Recent Labs    08/26/20 0944 09/17/20 1018 09/28/20 1112  NA 139 137 141  K 4.1 4.0 4.0  CL 104 103 105  CO2 27 26 25   GLUCOSE 98 98 109*  BUN 12 14 10   CALCIUM 9.6 9.6 9.6   CREATININE 0.68 0.68 0.52  GFRNONAA  --  >60 >60    LIVER FUNCTION TESTS: Recent Labs    08/26/20 0944 09/17/20 1018 09/28/20 1112  BILITOT 0.3 0.3 0.8  AST 19 21 22   ALT 13 14 16   ALKPHOS  --  66 76  PROT 7.6 7.4 8.2*  ALBUMIN  --  3.9 4.2    TUMOR MARKERS: Recent Labs    08/26/20 0944  CEA 7.7*    Assessment and Plan: 75 y.o. female with past medical history of arthritis, degenerative disc disease, and colon cancer diagnosed in 2015 with prior right hemicolectomy and chemotherapy.  CEA on 09/17/2020 is 21.4 up from 4.11 in 2020.  Follow-up imaging this month has revealed: 1 . Redemonstrated postoperative findings of partial right colectomy. 2. A soft tissue nodule within the central mesentery in the ventral abdomen has substantially increased in size, now encasing the proximal superior mesenteric artery and measuring 5.0 x 4.3 cm, previously 1.9 x 1.7 cm. 3. Numerous new small pulmonary nodules throughout the included lung bases, measuring up to 5 mm. 4. There is a new hypodense lesion of the anterior right lobe of the liver measuring 1.4 x 1.0 cm. 5. Numerous newly enlarged retrocrural and periaortic lymph nodes in the included lower chest as well as numerous newly enlarged retroperitoneal lymph nodes. 6. Above constellation of findings is consistent with worsened nodal metastatic disease in addition to new pulmonary and hepatic metastatic disease.  1. Findings of metastatic disease in the chest with LEFT thoracic inlet lymph node, mediastinal lymph nodes and multiple pulmonary nodules in addition to abdominal findings which were outlined in the recent comparison study. 2. Aortic atherosclerosis.  She presents today for image guided liver lesion biopsy for further evaluation.Risks and benefits of procedure was discussed with the patient  including, but not limited to bleeding, infection, damage to adjacent structures or low yield requiring additional tests.  All  of the questions were answered and there is agreement to proceed.  Consent signed and in chart.     Thank you for this interesting consult.  I greatly enjoyed meeting ASHWINI JAGO and look forward to participating in their care.  A copy of this report was sent to the requesting provider on this date.  Electronically Signed: D. Rowe Robert, PA-C 09/28/2020, 12:10 PM   I spent a total of  25 minutes in face to face in clinical consultation, greater than 50% of which was counseling/coordinating care for image guided liver lesion biopsy

## 2020-09-28 NOTE — Discharge Instructions (Signed)
Interventional radiology phone numbers [phone] After hours [phone]      Needle Biopsy, Care After These instructions tell you how to care for yourself after your procedure. Your doctor may also give you more specific instructions. Call your doctor if you have any problems or questions. What can I expect after the procedure? After the procedure, it is common to have:  Soreness.  Bruising.  Mild pain. Follow these instructions at home:  Return to your normal activities as told by your doctor. Ask your doctor what activities are safe for you.  Take over-the-counter and prescription medicines only as told by your doctor.  Wash your hands with soap and water before you change your bandage (dressing). If you cannot use soap and water, use hand sanitizer.  Follow instructions from your doctor about: ? You may remove your dressing tomorrow and shower. No baths, hot tubs, or pools for 2 weeks.  Check your puncture site every day for signs of infection. Watch for: ? Redness, swelling, or pain. ? Fluid or blood. ? Pus or a bad smell. ? Warmth.  Do not take baths, swim, or use a hot tub until your doctor approves.You may shower tomorrow afternoon. Remove your dressing prior to shower.  Keep all follow-up visits as told by your doctor. This is important.   Contact a doctor if you have:  A fever.  Redness, swelling, or pain at the puncture site, and it lasts longer than a few days.  Fluid, blood, or pus coming from the puncture site.  Warmth coming from the puncture site. Get help right away if:  You have a lot of bleeding from the puncture site. Summary  After the procedure, it is common to have soreness, bruising, or mild pain at the puncture site.  Check your puncture site every day for signs of infection, such as redness, swelling, or pain.  Get help right away if you have severe bleeding from your puncture site. This information is not intended to replace  advice given to you by your health care provider. Make sure you discuss any questions you have with your health care provider. Document Revised: 12/25/2019 Document Reviewed: 12/25/2019 Elsevier Patient Education  2021 [place].     Moderate Conscious Sedation, Adult, Care After This sheet gives you information about how to care for yourself after your procedure. Your health care provider may also give you more specific instructions. If you have problems or questions, contact your health care provider. What can I expect after the procedure? After the procedure, it is common to have:  Sleepiness for several hours.  Impaired judgment for several hours.  Difficulty with balance.  Vomiting if you eat too soon. Follow these instructions at home: For the time period you were told by your health care provider:  Rest.  Do not participate in activities where you could fall or become injured.  Do not drive or use machinery.  Do not drink alcohol.  Do not take sleeping pills or medicines that cause drowsiness.  Do not make important decisions or sign legal documents.  Do not take care of children on your own.      Eating and drinking  Follow the diet recommended by your health care provider.  Drink enough fluid to keep your urine pale yellow.  If you vomit: ? Drink water, juice, or soup when you can drink without vomiting. ? Make sure you have little or no nausea before eating solid foods.   General instructions  Take over-the-counter  and prescription medicines only as told by your health care provider.  Have a responsible adult stay with you for the time you are told. It is important to have someone help care for you until you are awake and alert.  Do not smoke.  Keep all follow-up visits as told by your health care provider. This is important. Contact a health care provider if:  You are still sleepy or having trouble with balance after 24 hours.  You feel  light-headed.  You keep feeling nauseous or you keep vomiting.  You develop a rash.  You have a fever.  You have redness or swelling around the IV site. Get help right away if:  You have trouble breathing.  You have new-onset confusion at home. Summary  After the procedure, it is common to feel sleepy, have impaired judgment, or feel nauseous if you eat too soon.  Rest after you get home. Know the things you should not do after the procedure.  Follow the diet recommended by your health care provider and drink enough fluid to keep your urine pale yellow.  Get help right away if you have trouble breathing or new-onset confusion at home. This information is not intended to replace advice given to you by your health care provider. Make sure you discuss any questions you have with your health care provider. Document Revised: 10/24/2019 Document Reviewed: 05/22/2019 Elsevier Patient Education  2021 [employer].

## 2020-09-28 NOTE — Procedures (Signed)
Interventional Radiology Procedure Note  Procedure: Mesenteric mass biopsy  Indication: Multifocal lymph adenopathy  Findings: Please refer to procedural dictation for full description.  Complications: None  EBL: < 10 mL  Miachel Roux, MD [phone]

## 2020-09-29 LAB — SURGICAL PATHOLOGY

## 2020-10-04 ENCOUNTER — Encounter (HOSPITAL_COMMUNITY): Payer: Self-pay | Admitting: Hematology

## 2020-10-04 ENCOUNTER — Inpatient Hospital Stay (HOSPITAL_BASED_OUTPATIENT_CLINIC_OR_DEPARTMENT_OTHER): Payer: Medicare Other | Admitting: Hematology

## 2020-10-04 ENCOUNTER — Other Ambulatory Visit: Payer: Self-pay

## 2020-10-04 VITALS — BP 151/69 | HR 79 | Temp 96.8°F | Resp 16 | Wt 157.5 lb

## 2020-10-04 DIAGNOSIS — R630 Anorexia: Secondary | ICD-10-CM | POA: Diagnosis not present

## 2020-10-04 DIAGNOSIS — Z85038 Personal history of other malignant neoplasm of large intestine: Secondary | ICD-10-CM | POA: Diagnosis not present

## 2020-10-04 DIAGNOSIS — R197 Diarrhea, unspecified: Secondary | ICD-10-CM | POA: Diagnosis not present

## 2020-10-04 DIAGNOSIS — C184 Malignant neoplasm of transverse colon: Secondary | ICD-10-CM | POA: Diagnosis not present

## 2020-10-04 DIAGNOSIS — R634 Abnormal weight loss: Secondary | ICD-10-CM | POA: Diagnosis not present

## 2020-10-04 DIAGNOSIS — Z9221 Personal history of antineoplastic chemotherapy: Secondary | ICD-10-CM | POA: Diagnosis not present

## 2020-10-04 DIAGNOSIS — Z9049 Acquired absence of other specified parts of digestive tract: Secondary | ICD-10-CM | POA: Diagnosis not present

## 2020-10-04 NOTE — Progress Notes (Signed)
Amanda Norris, [place] [address]   CLINIC:  Medical Oncology/Hematology  PCP:  [facility], [place] / [place] [address] [phone]   REASON FOR VISIT:  Follow-up for transverse colon adenocarcinoma  PRIOR THERAPY:  1. Colectomy on 12/29/2013. 2. FOLFOX x 3 cycles from 02/10/2014 to 03/10/2014, stopped secondary to poor tolerance.  NGS Results: Not done  CURRENT THERAPY: Under work-up  BRIEF ONCOLOGIC HISTORY:  Oncology History  Adenocarcinoma of transverse colon ([place]) (Resolved)  12/09/2013 Initial Diagnosis   1. Colon, biopsy, proximal transverse mass INVASIVE ADENOCARCINOMA.   12/29/2013 Definitive Surgery   Colon, segmental resection for tumor, right - INVASIVE COLORECTAL ADENOCARCINOMA, 7 CM, EXTENDING INTO PERICOLONIC CONNECTIVE TISSUE. - METASTATIC CARCINOMA IN 6 OF 33 LYMPH NODES (6/33).   02/10/2014 - 03/10/2014 Adjuvant Chemotherapy   FOLFOX x 3 cycles   03/11/2014 Adverse Reaction   Patient called reporting diarrhea despite dose reduction and she wants to stop therapy.  Patient seen on 03/24/14 to discuss other treatment options and she stands by her decision to stop all therapy.   04/01/2014 Surgery   Port-a-cath removal by Dr. Arnoldo Morale.   01/12/2015 PET scan   Mild abdominal mesenteric LAD show hypermetabolic activity, 7 mm indeterminate pulm nodule in sup segment of RLL shows no assoc metabolic activity   03/12/8332 PET scan   Mild progression of nodal mets in anterior mid abdominal mesentery, new nodal mets at L thoracic inlet. stable 6 mm nodule in posterior RLL, unchanged since 2015   06/22/2015 PET scan   Resolution of metabolic activity and decreased size of mild LAD at L thoracic inlet, stable mild mid abd hypermetabolic mesenteric LAD, stable 6 mm posterior LLL pulm nodule without metabolic activity   8/32/9191 Imaging   MRI lumbar spine L4-L5 disc degenerated, broad based disc hernation, B facet  arthropathy with hypertophy and edema, stenosis of lateral recesses that could cause neural compression   10/18/2016 Imaging   CT CAP- 1. Several small ground-glass pulmonary nodules are present in the lungs and are stable over the past 9 months, but several are mildly larger than they were in 2015. Low-grade adenocarcinoma can sometimes have this appearance and surveillance is likely warranted. 2. There is a cluster of nodal tissue in the central mesentery, maximum short axis diameter 1.3 cm. This nodal cluster is relatively low in density and not appreciably changed from the prior exam. Given the lack of change is may represent quiescent residua of malignancy, but again, surveillance is likely warranted. 3. The left-sided rib fractures are more numerous than was revealed on the prior radiographs ; there are nondisplaced healing fractures of the left fifth, sixth, seventh, eighth, ninth, and tenth ribs. 4. Mild cardiomegaly although part of this appearance may be due to pectus excavatum. 5. Several additional small pulmonary nodules are stable from 2015 and considered benign. 6. Right hemicolectomy and sigmoid colon diverticulosis. 7. Small right paraumbilical hernia containing adipose tissue. 8. Lower lumbar spondylosis and degenerative disc disease potentially with mild impingement at L4-5.   04/25/2017 Imaging   CT C/A/P: Scattered solid pulmonary nodules measure up to 6 mm in the right lower lobe, unchanged. There are a few scattered ground-glass nodules measuring up to 8 mm in the right upper lobe, also stable. Distal perigastric lymph nodes are stable. No additional evidence of metastatic disease.     CANCER STAGING: Cancer Staging No matching staging information was found for the patient.  INTERVAL HISTORY:  Amanda Norris, a 75 y.o. female, returns for routine follow-up of her transverse colon adenocarcinoma. Amanda Norris was last seen on 09/22/2020.   Today she is accompanied  by her son and she reports feeling okay. She tolerated the ultrasound biopsy of her liver well. She takes Lomotil BID for her diarrhea and notes that she will get watery diarrhea after several hours if she does not take Lomotil; she denies having diarrhea at night. She also reports having occasional constipation as a result of the Lomotil.  She is open to undergoing chemo for her colon cancer.  REVIEW OF SYSTEMS:  Review of Systems  Constitutional: Positive for appetite change (75%) and fatigue (50%).  Respiratory: Positive for shortness of breath (w/ exertion).   Gastrointestinal: Positive for constipation (occasional) and diarrhea (on Lomotil).  Musculoskeletal: Positive for back pain (4/10 lower back pain).  Psychiatric/Behavioral: The patient is nervous/anxious.   All other systems reviewed and are negative.   PAST MEDICAL/SURGICAL HISTORY:  Past Medical History:  Diagnosis Date  . Arthritis    wrist and thumbs  . Colon cancer ([employer])   . PONV (postoperative nausea and vomiting)   . Ruptured lumbar disc    L1-L5 per patient report  . Sciatica of left side    Past Surgical History:  Procedure Laterality Date  . COLONOSCOPY N/A 12/12/2013   Procedure: COLONOSCOPY;  Surgeon: Rogene Houston, MD;  [facility];  Service: Endoscopy;  Laterality: N/A;  730  . COLONOSCOPY N/A 01/20/2015   Procedure: COLONOSCOPY;  Surgeon: Rogene Houston, MD;  [facility];  Service: Endoscopy;  Laterality: N/A;  1030  . COLONOSCOPY N/A 03/28/2018   Procedure: COLONOSCOPY;  Surgeon: Rogene Houston, MD;  [facility];  Service: Endoscopy;  Laterality: N/A;  1015  . EUS N/A 12/18/2013   Procedure: UPPER ENDOSCOPIC ULTRASOUND (EUS) LINEAR;  Surgeon: Milus Banister, MD;  Location: [facility];  Service: Endoscopy;  Laterality: N/A;  . herniated disc     [id number]  . PARTIAL COLECTOMY N/A 12/29/2013   Procedure: RIGHT HEMICOLECTOMY;  Surgeon: Jamesetta So, MD;  Location:  [facility];  Service: General;  Laterality: N/A;  . POLYPECTOMY  03/28/2018   Procedure: POLYPECTOMY;  Surgeon: Rogene Houston, MD;  [facility];  Service: Endoscopy;;  transverse colon (hot snare);  Marland Kitchen PORT-A-CATH REMOVAL Right 04/01/2014   Procedure: MINOR REMOVAL PORT-A-CATH;  Surgeon: Jamesetta So, MD;  Location: [facility];  Service: General;  Laterality: Right;  . PORTACATH PLACEMENT Right 02/04/2014   Procedure: INSERTION PORT-A-CATH;  Surgeon: Jamesetta So, MD;  Location: [facility];  Service: General;  Laterality: Right;    SOCIAL HISTORY:  Social History   Socioeconomic History  . Marital status: Married    Spouse name: Not on file  . Number of children: Not on file  . Years of education: Not on file  . Highest education level: Not on file  Occupational History  . Not on file  Tobacco Use  . Smoking status: Former Smoker    Packs/day: 0.25    Years: 5.00    Pack years: 1.25    Types: Cigarettes    Quit date: 04/19/1959    Years since quitting: 61.5  . Smokeless tobacco: Never Used  . Tobacco comment: smoked for 5 years as teenager  Substance and Sexual Activity  . Alcohol use: No  . Drug use: No  . Sexual activity: Yes    Birth control/protection: Post-menopausal  Other Topics Concern  . Not on file  Social History Narrative  . Not on file   Social Determinants of Health   Financial Resource Strain: Low Risk   . Difficulty of Paying Living Expenses: Not hard at all  Food Insecurity: No Food Insecurity  . Worried About Charity fundraiser in the Last Year: Never true  . Ran Out of Food in the Last Year: Never true  Transportation Needs: No Transportation Needs  . Lack of Transportation (Medical): No  . Lack of Transportation (Non-Medical): No  Physical Activity: Inactive  . Days of Exercise per Week: 0 days  . Minutes of Exercise per Session: 0 min  Stress: Stress Concern Present  . Feeling of Stress : To some extent  Social Connections: Socially  Integrated  . Frequency of Communication with Friends and Family: More than three times a week  . Frequency of Social Gatherings with Friends and Family: More than three times a week  . Attends Religious Services: More than 4 times per year  . Active Member of Clubs or Organizations: Yes  . Attends Archivist Meetings: More than 4 times per year  . Marital Status: Married  Human resources officer Violence: Not At Risk  . Fear of Current or Ex-Partner: No  . Emotionally Abused: No  . Physically Abused: No  . Sexually Abused: No    FAMILY HISTORY:  Family History  Problem Relation Age of Onset  . Diabetes type II Mother   . Dementia Father     CURRENT MEDICATIONS:  Current Outpatient Medications  Medication Sig Dispense Refill  . acetaminophen (TYLENOL) 500 MG tablet Take 500 mg by mouth every 6 (six) hours as needed for moderate pain or headache.    Marland Kitchen aspirin 81 MG EC tablet Take 1 tablet (81 mg total) by mouth daily. Swallow whole. 30 tablet 12  . Bioflavonoid Products (ESTER C PO) Take 1,000 mg by mouth daily.    . Cholecalciferol (D3-1000) 25 MCG (1000 UT) capsule Take 125 Units by mouth daily.    . Cranberry-Vitamin C-Vitamin E 4200-20-3 MG-MG-UNIT CAPS Take 500 mg by mouth daily.    . Garlic 4665 MG CAPS Take 1,000 mg by mouth daily.     Marland Kitchen LACTOBACILLUS PO Take 1 tablet by mouth daily.    . Multiple Vitamin (MULTIVITAMIN) tablet Take 1 tablet by mouth daily.    . Omega-3 Fatty Acids (FISH OIL) 1200 MG CAPS Take 1,000 mg by mouth daily.     No current facility-administered medications for this visit.    ALLERGIES:  Allergies  Allergen Reactions  . Sulfa Antibiotics Other (See Comments)    "Sores in my mouth"    PHYSICAL EXAM:  Performance status (ECOG): 1 - Symptomatic but completely ambulatory  Vitals:   10/04/20 1148 10/04/20 1157  BP:  (!) 151/69  Pulse: 79   Resp: 16   Temp: (!) 96.8 F (36 C)   SpO2: 100%    Wt Readings from Last 3 Encounters:   10/04/20 157 lb 8 oz (71.4 kg)  09/22/20 156 lb 3.2 oz (70.9 kg)  08/26/20 156 lb (70.8 kg)   Physical Exam Vitals reviewed.  Constitutional:      Appearance: Normal appearance.  Cardiovascular:     Rate and Rhythm: Normal rate and regular rhythm.     Pulses: Normal pulses.     Heart sounds: Normal heart sounds.  Pulmonary:     Effort: Pulmonary effort is normal.  Breath sounds: Normal breath sounds.  Chest:  Breasts:     Right: No supraclavicular adenopathy.     Left: Supraclavicular adenopathy present.    Musculoskeletal:     Right lower leg: No edema.     Left lower leg: No edema.  Lymphadenopathy:     Upper Body:     Right upper body: No supraclavicular adenopathy.     Left upper body: Supraclavicular adenopathy present.  Neurological:     General: No focal deficit present.     Mental Status: She is alert and oriented to person, place, and time.  Psychiatric:        Mood and Affect: Mood normal.        Behavior: Behavior normal.      LABORATORY DATA:  I have reviewed the labs as listed.  CBC Latest Ref Rng & Units 09/28/2020 09/17/2020 08/26/2020  WBC 4.0 - 10.5 K/uL 7.6 7.5 7.5  Hemoglobin 12.0 - 15.0 g/dL 13.7 12.9 13.8  Hematocrit 36.0 - 46.0 % 42.3 40.6 41.5  Platelets 150 - 400 K/uL 236 232 286   CMP Latest Ref Rng & Units 09/28/2020 09/17/2020 08/26/2020  Glucose 70 - 99 mg/dL 109(H) 98 98  BUN 8 - 23 mg/dL $Remove'10 14 12  'EhtUZeZ$ Creatinine 0.44 - 1.00 mg/dL 0.52 0.68 0.68  Sodium 135 - 145 mmol/L 141 137 139  Potassium 3.5 - 5.1 mmol/L 4.0 4.0 4.1  Chloride 98 - 111 mmol/L 105 103 104  CO2 22 - 32 mmol/L $RemoveB'25 26 27  'ZlBqFQvw$ Calcium 8.9 - 10.3 mg/dL 9.6 9.6 9.6  Total Protein 6.5 - 8.1 g/dL 8.2(H) 7.4 7.6  Total Bilirubin 0.3 - 1.2 mg/dL 0.8 0.3 0.3  Alkaline Phos 38 - 126 U/L 76 66 -  AST 15 - 41 U/L $Remo'22 21 19  'QIXRO$ ALT 0 - 44 U/L $Remo'16 14 13    'hhWdh$ DIAGNOSTIC IMAGING:  I have independently reviewed the scans and discussed with the patient. CT Chest W Contrast  Result Date:  09/27/2020 CLINICAL DATA:  Incidental nodules on CT of the abdomen and pelvis in the lung bases. EXAM: CT CHEST WITH CONTRAST TECHNIQUE: Multidetector CT imaging of the chest was performed during intravenous contrast administration. CONTRAST:  71mL OMNIPAQUE IOHEXOL 300 MG/ML  SOLN COMPARISON:  November 04, 2018, CT of the chest and abdomen and pelvis CT dated September 21, 2020 FINDINGS: Cardiovascular: Heart size is normal. No pericardial effusion. No aneurysmal dilation of the thoracic aorta no significant aortic atherosclerosis. Central pulmonary vasculature is unremarkable. Mediastinum/Nodes: LEFT thoracic inlet lymph node measures approximately 2.3 cm short axis (image 19, series 2) Retrocrural lymph nodes with enlargement and low posterior mediastinal lymph node adjacent to the esophageal and aortic hiatus with similar appearance to recent abdominal CT, largest approximately 2.6 cm short axis. No axillary lymphadenopathy. No hilar adenopathy. Borderline enlarged lymph nodes in the chest elsewhere, in particular a superior mediastinal lymph node on image 45 of series 2 measuring approximately 11 mm. Lungs/Pleura: Numerous bilateral pulmonary nodules with distribution that could be seen in the setting of metastatic disease. LEFT and RIGHT lung are involved. Nodules are present throughout the chest between 20 and 25 nodules are present in the LEFT lower lobe alone. (Image 131, series 4) 6 mm LEFT lower lobe pulmonary nodule with similar size adjacent pulmonary nodules on this single image. (Image 129, series 4) 6-7 mm RIGHT lower lobe pulmonary nodule and numerous other nodules throughout the LEFT and RIGHT chest. Largest in the RIGHT upper lobe at 9  mm. No acute cardiopulmonary process. Upper Abdomen: Medial segment LEFT hepatic lobe lesion 13 mm similar to very recent comparison study. No acute upper abdominal process. Musculoskeletal: Spinal degenerative changes. No acute bone finding. No destructive bone process.  IMPRESSION: 1. Findings of metastatic disease in the chest with LEFT thoracic inlet lymph node, mediastinal lymph nodes and multiple pulmonary nodules in addition to abdominal findings which were outlined in the recent comparison study. 2. Aortic atherosclerosis. Aortic Atherosclerosis (ICD10-I70.0). Electronically Signed   By: Zetta Bills M.D.   On: 09/27/2020 13:01   CT Abdomen Pelvis W Contrast  Result Date: 09/21/2020 CLINICAL DATA:  Colorectal cancer, status post transverse colon resection EXAM: CT ABDOMEN AND PELVIS WITH CONTRAST TECHNIQUE: Multidetector CT imaging of the abdomen and pelvis was performed using the standard protocol following bolus administration of intravenous contrast. CONTRAST:  170mL OMNIPAQUE IOHEXOL 300 MG/ML SOLN, additional oral enteric contrast COMPARISON:  11/04/2018 FINDINGS: Lower chest: There are numerous new small pulmonary nodules throughout the included lung bases, measuring up to 5 mm (series 6, image 29). There are newly enlarged retrocrural and periaortic lymph nodes in the included lower chest measuring up to 2.6 x 2.0 cm (series 3, image 26). Hepatobiliary: There is a new hypodense lesion of the anterior right lobe of the liver measuring 1.4 x 1.0 cm (series 3, image 40). No gallstones, gallbladder wall thickening, or biliary dilatation. Pancreas: Unremarkable. No pancreatic ductal dilatation or surrounding inflammatory changes. Spleen: Normal in size without significant abnormality. Adrenals/Urinary Tract: Adrenal glands are unremarkable. Kidneys are normal, without renal calculi, solid lesion, or hydronephrosis. Bladder is unremarkable. Stomach/Bowel: Stomach is within normal limits. Redemonstrated postoperative findings of partial right colectomy. A soft tissue nodule within the central mesentery in the ventral abdomen has substantially increased in size, now encasing the proximal superior mesenteric artery and measuring 5.0 x 4.3 cm, previously 1.9 x 1.7 cm  (series 3, image 63). Sigmoid diverticulosis. Vascular/Lymphatic: No significant vascular findings are present. Numerous retroperitoneal lymph nodes, largest aortocaval node measuring 2.8 x 2.2 cm (series 3, image 54). Reproductive: Prominent bilateral uterine and adnexal varices. Other: No abdominal wall hernia or abnormality. No abdominopelvic ascites. Musculoskeletal: No acute or significant osseous findings. IMPRESSION: 1. Redemonstrated postoperative findings of partial right colectomy. 2. A soft tissue nodule within the central mesentery in the ventral abdomen has substantially increased in size, now encasing the proximal superior mesenteric artery and measuring 5.0 x 4.3 cm, previously 1.9 x 1.7 cm. 3. Numerous new small pulmonary nodules throughout the included lung bases, measuring up to 5 mm. 4. There is a new hypodense lesion of the anterior right lobe of the liver measuring 1.4 x 1.0 cm. 5. Numerous newly enlarged retrocrural and periaortic lymph nodes in the included lower chest as well as numerous newly enlarged retroperitoneal lymph nodes. 6. Above constellation of findings is consistent with worsened nodal metastatic disease in addition to new pulmonary and hepatic metastatic disease. Electronically Signed   By: Eddie Candle M.D.   On: 09/21/2020 09:21   US BIOPSY (LIVER)  Result Date: 09/28/2020 INDICATION: 75 year old woman with prior history of colon cancer with recent CT demonstrating findings of metastatic disease with multifocal lymphadenopathy and right hepatic lesion. She presents today measure radiology for biopsy right hepatic lesion, however the lesion could not be visualized on ultrasound. The lymphadenopathy within the mesentery could be visualized by ultrasound, and was targeted for biopsy. EXAM: Ultrasound-guided biopsy of mesenteric lymphadenopathy MEDICATIONS: None. ANESTHESIA/SEDATION: Moderate (conscious) sedation was employed during this procedure.  A total of Versed 2 mg and  Fentanyl 100 mcg was administered intravenously. Moderate Sedation Time: 10 minutes. The patient's level of consciousness and vital signs were monitored continuously by radiology nursing throughout the procedure under my direct supervision. COMPLICATIONS: None immediate. PROCEDURE: Informed written consent was obtained from the patient after a thorough discussion of the procedural risks, benefits and alternatives. All questions were addressed. Maximal Sterile Barrier Technique was utilized including caps, mask, sterile gowns, sterile gloves, sterile drape, hand hygiene and skin antiseptic. A timeout was performed prior to the initiation of the procedure. Patient position supine on the ultrasound table. Midline abdominal skin prepped and draped in usual sterile fashion. Following local lidocaine administration, 17 gauge introducer needle was advanced into the mesenteric lymph node mass, and 4-18 gauge cores were obtained utilizing continuous ultrasound guidance. Samples were sent to pathology in formalin. Needle removed and hemostasis achieved with 2 minutes of manual compression. Post procedure ultrasound images showed no evidence of significant hemorrhage. IMPRESSION: Ultrasound-guided biopsy of mesenteric lymph node mass as above. The hepatic lesions seen on CT could not be identified with ultrasound. Electronically Signed   By: Miachel Roux M.D.   On: 09/28/2020 14:36     ASSESSMENT:  1. 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 -Status post 3 cycles of FOLFOX from 02/10/2014 through 03/10/2014, discontinued secondary to poor tolerance. DPD was negative. -Last colonoscopy on 03/28/2018 patent ileo-colonic anastomosis. 8 mm polyp in the transverse colon. Diverticulosis in the sigmoid colon, descending colon. -CT scan on 11/04/2018 shows postoperative findings of  right colectomy with redemonstrated mesenteric nodule/lymph node measuring 1.9 x 1.7 cm, unchanged from prior scan in October 2019. No evidence of metastatic disease. Stable solid and groundglass pulmonary nodules, stable over multiple prior exams. -CEA was 4.6 on 10/28/2018. -CTAP on 09/21/2020 showed numerous small pulmonary nodules throughout the lung bases, 5 mm.  Enlarged retrocrural periaortic lymph node in the lower chest.  Soft tissue nodule within the central mesentery substantially increased in size, encasing proximal superior mesenteric artery and measuring 5 x 4.3 cm.  Numerous retroperitoneal lymph nodes, largest aortocaval lymph node measuring 2.8 x 2.2 cm. -CT chest on 09/27/2020 shows a left thoracic inlet lymph node approximately 2.3 cm.  Mediastinal lymph nodes and multiple subcentimeter pulmonary nodules suggestive of metastatic disease.  2. Health maintenance: -Mammogram dated 05/16/2018 was BI-RADS Category 1.   PLAN:  1.  Metastatic colon cancer to the liver, lungs and lymph nodes: -We have reviewed results of the CT chest which showed multiple subcentimeter lung nodules and adenopathy. -Reviewed biopsy results from mesenteric lymph node mass.  It is comprised of mucin and fibrosis, with scattered foci of central necrosis with no viable tissue. -Apparently they could not find liver lesion on ultrasound. -Recommend attempting biopsy of the left thoracic inlet lymph node.  If not we will have to rebiopsy the mesenteric mass or CT-guided biopsy of the liver lesion.  Will need tissue for molecular testing.  We will also check MSI testing. -I have also recommended port placement, potentially at the same time. -RTC after biopsy to discuss results and further plan.  2.  Diarrhea: -Continue Imodium 2-3 times a day which is helping.   Orders placed this encounter:  No orders of the defined types were placed in this encounter.    Derek Jack, MD [facility] [phone]   I, Quillian Quince  Khashchuk, am acting as a scribe for Dr. Sanda Linger.  I, Derek Jack MD, have reviewed the above documentation for accuracy and completeness, and I agree with the above.

## 2020-10-04 NOTE — Patient Instructions (Signed)
[place] at [facility] Discharge Instructions  You were seen today by Dr. Delton Coombes. He went over your recent results and scans. You will be referred to interventional radiology to get another biopsy of your left lower neck lymph node and for port placement. Dr. Delton Coombes will see you back after the biopsy results for follow up.   Thank you for choosing [place] [facility] to provide your oncology and hematology care.  To afford each patient quality time with our provider, please arrive at least 15 minutes before your scheduled appointment time.   If you have a lab appointment with the [facility] please come in thru the Main Entrance and check in at the main information desk  You need to re-schedule your appointment should you arrive 10 or more minutes late.  We strive to give you quality time with our providers, and arriving late affects you and other patients whose appointments are after yours.  Also, if you no show three or more times for appointments you may be dismissed from the clinic at the providers discretion.     Again, thank you for choosing [facility].  Our hope is that these requests will decrease the amount of time that you wait before being seen by our physicians.       _____________________________________________________________  Should you have questions after your visit to [facility], please contact our office at [phone] between the hours of 8:00 a.m. and 4:30 p.m.  Voicemails left after 4:00 p.m. will not be returned until the following business day.  For prescription refill requests, have your pharmacy contact our office and allow 72 hours.    Cancer Center Support Programs:   > Cancer Support Group  2nd Tuesday of the month 1pm-2pm, Journey Room

## 2020-10-05 ENCOUNTER — Encounter (HOSPITAL_COMMUNITY): Payer: Self-pay

## 2020-10-05 ENCOUNTER — Telehealth (HOSPITAL_COMMUNITY): Payer: Self-pay | Admitting: Surgery

## 2020-10-05 NOTE — Telephone Encounter (Signed)
The pt's son called with several questions about his mother's prognosis, the treatment plan, and what to expect if the pt does not decide to pursue treatment.  Dr. Delton Coombes was made aware of the family member's questions, and I have followed up with Amanda Norris to see if the all the questions were answered.

## 2020-10-05 NOTE — Progress Notes (Signed)
[place] Female, 75 y.o., May 30, 1946  MRN:  [id number] Phone:  [phone] Jerilynn Mages)       PCP:  Redmond School, MD Primary Cvg:  Medicare/Medicare Part A And B  Next Appt With Radiology ([facility]US 2) 10/14/2020 at 1:00 PM           RE: Biopsy Received: Today  Message Details  Suttle, Rosanne Ashing, MD  [facility] Solders E Approved for ultrasound guided left supraclavicular lymph node biopsy.    Dylan    Previous Messages  ----- Message -----  From: Lenore Cordia  Sent: 10/05/2020 10:47 AM EDT  To: Ir Procedure Requests  Subject: Biopsy                      Procedure Requested: CT/US Guided Biopsy    Reason for Procedure: metastatic disease with multifocal lymphadenopathy    Provider Requesting: Dr Delton Coombes  Provider Telephone: [phone]    Other Info:

## 2020-10-06 ENCOUNTER — Encounter (HOSPITAL_COMMUNITY): Payer: Self-pay

## 2020-10-06 NOTE — Progress Notes (Signed)
Notification received from [facility], [place] to assist in addressing son's questions regarding patient's prognosis and treatment plan. Case discussed with Dr. Delton Coombes and call placed to patient's son to explain that cancer does appear to be metastasized and without treatment prognosis would be limited to a few months. We discuss that treatment could be initiated and continued indefinitely with the goal of controlling the cancer, per Dr. Delton Coombes. Patient's son verbalized understanding and is grateful for my call.

## 2020-10-12 ENCOUNTER — Ambulatory Visit (HOSPITAL_COMMUNITY): Payer: Medicare Other | Admitting: Hematology

## 2020-10-13 ENCOUNTER — Other Ambulatory Visit: Payer: Self-pay | Admitting: Student

## 2020-10-14 ENCOUNTER — Ambulatory Visit (HOSPITAL_COMMUNITY)
Admission: RE | Admit: 2020-10-14 | Discharge: 2020-10-14 | Disposition: A | Discharge status: Home / Self Care | Payer: Medicare Other | Source: Ambulatory Visit | Attending: Hematology | Admitting: Hematology

## 2020-10-14 ENCOUNTER — Encounter (HOSPITAL_COMMUNITY): Payer: Self-pay

## 2020-10-14 ENCOUNTER — Other Ambulatory Visit: Payer: Self-pay

## 2020-10-14 DIAGNOSIS — Z882 Allergy status to sulfonamides status: Secondary | ICD-10-CM | POA: Insufficient documentation

## 2020-10-14 DIAGNOSIS — Z87891 Personal history of nicotine dependence: Secondary | ICD-10-CM | POA: Insufficient documentation

## 2020-10-14 DIAGNOSIS — R590 Localized enlarged lymph nodes: Secondary | ICD-10-CM | POA: Diagnosis not present

## 2020-10-14 DIAGNOSIS — C773 Secondary and unspecified malignant neoplasm of axilla and upper limb lymph nodes: Secondary | ICD-10-CM | POA: Insufficient documentation

## 2020-10-14 DIAGNOSIS — C189 Malignant neoplasm of colon, unspecified: Secondary | ICD-10-CM | POA: Insufficient documentation

## 2020-10-14 DIAGNOSIS — Z452 Encounter for adjustment and management of vascular access device: Secondary | ICD-10-CM | POA: Diagnosis not present

## 2020-10-14 DIAGNOSIS — Z79899 Other long term (current) drug therapy: Secondary | ICD-10-CM | POA: Insufficient documentation

## 2020-10-14 DIAGNOSIS — C184 Malignant neoplasm of transverse colon: Secondary | ICD-10-CM

## 2020-10-14 DIAGNOSIS — Z7982 Long term (current) use of aspirin: Secondary | ICD-10-CM | POA: Insufficient documentation

## 2020-10-14 HISTORY — PX: IR US GUIDANCE: IMG2393

## 2020-10-14 HISTORY — PX: IR IMAGING GUIDED PORT INSERTION: IMG5740

## 2020-10-14 MED ORDER — MIDAZOLAM HCL 2 MG/2ML IJ SOLN
INTRAMUSCULAR | Status: AC | PRN
  Administered 2020-10-14: 1 mg via INTRAVENOUS

## 2020-10-14 MED ORDER — FENTANYL CITRATE (PF) 100 MCG/2ML IJ SOLN
INTRAMUSCULAR | Status: AC | PRN
  Administered 2020-10-14 (×2): 50 ug via INTRAVENOUS

## 2020-10-14 MED ORDER — FENTANYL CITRATE (PF) 100 MCG/2ML IJ SOLN
INTRAMUSCULAR | Status: AC | PRN
  Administered 2020-10-14: 50 ug via INTRAVENOUS
  Administered 2020-10-14 (×2): 25 ug via INTRAVENOUS

## 2020-10-14 MED ORDER — LIDOCAINE-EPINEPHRINE 1 %-1:100000 IJ SOLN
INTRAMUSCULAR | Status: AC
  Filled 2020-10-14: qty 1

## 2020-10-14 MED ORDER — FENTANYL CITRATE (PF) 100 MCG/2ML IJ SOLN
INTRAMUSCULAR | Status: AC
  Filled 2020-10-14: qty 2

## 2020-10-14 MED ORDER — HEPARIN SOD (PORK) LOCK FLUSH 100 UNIT/ML IV SOLN
INTRAVENOUS | Status: AC | PRN
  Administered 2020-10-14: 500 [IU] via INTRAVENOUS

## 2020-10-14 MED ORDER — SODIUM CHLORIDE 0.9 % IV SOLN: INTRAVENOUS | Status: DC

## 2020-10-14 MED ORDER — LIDOCAINE HCL (PF) 1 % IJ SOLN
INTRAMUSCULAR | Status: AC | PRN
  Administered 2020-10-14: 10 mL via INTRADERMAL
  Administered 2020-10-14: 5 mL via INTRADERMAL

## 2020-10-14 MED ORDER — HEPARIN SOD (PORK) LOCK FLUSH 100 UNIT/ML IV SOLN
INTRAVENOUS | Status: AC
  Filled 2020-10-14: qty 5

## 2020-10-14 MED ORDER — MIDAZOLAM HCL 2 MG/2ML IJ SOLN
INTRAMUSCULAR | Status: AC
  Filled 2020-10-14: qty 2

## 2020-10-14 MED ORDER — LIDOCAINE HCL 1 % IJ SOLN
INTRAMUSCULAR | Status: AC
  Filled 2020-10-14: qty 20

## 2020-10-14 MED ORDER — MIDAZOLAM HCL 2 MG/2ML IJ SOLN
INTRAMUSCULAR | Status: AC | PRN
  Administered 2020-10-14: 1 mg via INTRAVENOUS
  Administered 2020-10-14 (×2): 0.5 mg via INTRAVENOUS
  Administered 2020-10-14: 1 mg via INTRAVENOUS

## 2020-10-14 MED ORDER — LIDOCAINE HCL (PF) 1 % IJ SOLN
INTRAMUSCULAR | Status: AC | PRN
  Administered 2020-10-14: 10 mL via INTRADERMAL

## 2020-10-14 NOTE — Procedures (Signed)
Interventional Radiology Procedure:   Indications: History of colon cancer and new lymphadenopathy  Procedure: Port placement and US guided biopsy of left supraclavicular lymph node.  Findings: Right jugular port, tip at SVC/RA junction.  Multiple cores from left neck node.   Complications: None     EBL: Minimal, less than 10 ml  Plan: Discharge in one hour.  Keep port site and incisions dry for at least 24 hours.     Amanda Norris R. Anselm Pancoast, MD  Pager: [phone]

## 2020-10-14 NOTE — H&P (Signed)
Chief Complaint: Patient was seen in consultation today for metastatic colon cancer/left supraclavicular lymph node biopsy and Port-a-cath placement.  Referring Physician(s): Firefighter (oncology)  Supervising Physician: Markus Daft  Patient Status: [facility]  History of Present Illness: Amanda Norris is a 75 y.o. female with a past medical history of colon cancer, DDD, and arthritis. She was unfortunately diagnosed with adenocarcinoma of colon in 2015. Her cancer is managed by Dr. Delton Coombes. She has undergone right hemicolectomy and systemic chemotherapy as management. Follow-up imaging revealed probable metastatic disease. She needs additional tissue diagnosis of lymphadenopathy, along with durable IV access for systemic chemotherapy.  CT chest 09/27/2020: 1. Findings of metastatic disease in the chest with LEFT thoracic inlet lymph node, mediastinal lymph nodes and multiple pulmonary nodules in addition to abdominal findings which were outlined in the recent comparison study. 2. Aortic atherosclerosis.  CT abdomen/pelvis 09/21/2020: 1. Redemonstrated postoperative findings of partial right colectomy. 2. A soft tissue nodule within the central mesentery in the ventral abdomen has substantially increased in size, now encasing the proximal superior mesenteric artery and measuring 5.0 x 4.3 cm, previously 1.9 x 1.7 cm. 3. Numerous new small pulmonary nodules throughout the included lung bases, measuring up to 5 mm. 4. There is a new hypodense lesion of the anterior right lobe of the liver measuring 1.4 x 1.0 cm. 5. Numerous newly enlarged retrocrural and periaortic lymph nodes in the included lower chest as well as numerous newly enlarged retroperitoneal lymph nodes. 6. Above constellation of findings is consistent with worsened nodal metastatic disease in addition to new pulmonary and hepatic metastatic disease.  IR consulted by Dr. Delton Coombes for possible image-guided left  supraclavicular lymph node biopsy along with image-guided Port-a-cath placement. Patient awake and alert sitting in bed. Complains of intermittent abdominal pain when sitting forward. Denies pain when laying flat. Denies N/V associated with pain. Denies fever, chills, chest pain, dyspnea, or headache.   Past Medical History:  Diagnosis Date  . Arthritis    wrist and thumbs  . Colon cancer ([place])   . PONV (postoperative nausea and vomiting)   . Ruptured lumbar disc    L1-L5 per patient report  . Sciatica of left side     Past Surgical History:  Procedure Laterality Date  . COLONOSCOPY N/A 12/12/2013   Procedure: COLONOSCOPY;  Surgeon: Rogene Houston, MD;  Location: [facility];  Service: Endoscopy;  Laterality: N/A;  730  . COLONOSCOPY N/A 01/20/2015   Procedure: COLONOSCOPY;  Surgeon: Rogene Houston, MD;  Location: [facility];  Service: Endoscopy;  Laterality: N/A;  1030  . COLONOSCOPY N/A 03/28/2018   Procedure: COLONOSCOPY;  Surgeon: Rogene Houston, MD;  Location: [facility];  Service: Endoscopy;  Laterality: N/A;  1015  . EUS N/A 12/18/2013   Procedure: UPPER ENDOSCOPIC ULTRASOUND (EUS) LINEAR;  Surgeon: Milus Banister, MD;  Location: [facility];  Service: Endoscopy;  Laterality: N/A;  . herniated disc     [id number]  . PARTIAL COLECTOMY N/A 12/29/2013   Procedure: RIGHT HEMICOLECTOMY;  Surgeon: Jamesetta So, MD;  Location: [facility]S;  Service: General;  Laterality: N/A;  . POLYPECTOMY  03/28/2018   Procedure: POLYPECTOMY;  Surgeon: Rogene Houston, MD;  [facility];  Service: Endoscopy;;  transverse colon (hot snare);  Marland Kitchen PORT-A-CATH REMOVAL Right 04/01/2014   Procedure: MINOR REMOVAL PORT-A-CATH;  Surgeon: Jamesetta So, MD;  Location: [facility];  Service: General;  Laterality: Right;  . PORTACATH PLACEMENT Right  02/04/2014   Procedure: INSERTION PORT-A-CATH;  Surgeon: Jamesetta So, MD;  Location: [facility];  Service: General;  Laterality: Right;     Allergies: Sulfa antibiotics  Medications: Prior to Admission medications   Medication Sig Start Date End Date Taking? Authorizing Provider  acetaminophen (TYLENOL) 500 MG tablet Take 500 mg by mouth every 6 (six) hours as needed for moderate pain or headache.    [provider]  aspirin 81 MG EC tablet Take 1 tablet (81 mg total) by mouth daily. Swallow whole. 04/04/18   Rehman, Mechele Dawley, MD  Bioflavonoid Products (ESTER C PO) Take 1,000 mg by mouth daily.    [provider]  Cholecalciferol (D3-1000) 25 MCG (1000 UT) capsule Take 125 Units by mouth daily.    [provider]  Cranberry-Vitamin C-Vitamin E 4200-20-3 MG-MG-UNIT CAPS Take 500 mg by mouth daily.    [provider]  Garlic 4401 MG CAPS Take 1,000 mg by mouth daily.     [provider]  LACTOBACILLUS PO Take 1 tablet by mouth daily.    [provider]  Multiple Vitamin (MULTIVITAMIN) tablet Take 1 tablet by mouth daily.    [provider]  Omega-3 Fatty Acids (FISH OIL) 1200 MG CAPS Take 1,000 mg by mouth daily.    [provider]     Family History  Problem Relation Age of Onset  . Diabetes type II Mother   . Dementia Father     Social History   Socioeconomic History  . Marital status: Married    Spouse name: Not on file  . Number of children: Not on file  . Years of education: Not on file  . Highest education level: Not on file  Occupational History  . Not on file  Tobacco Use  . Smoking status: Former Smoker    Packs/day: 0.25    Years: 5.00    Pack years: 1.25    Types: Cigarettes    Quit date: 04/19/1959    Years since quitting: 61.5  . Smokeless tobacco: Never Used  . Tobacco comment: smoked for 5 years as teenager  Vaping Use  . Vaping Use: Never used  Substance and Sexual Activity  . Alcohol use: No  . Drug use: No  . Sexual activity: Yes    Birth control/protection: Post-menopausal  Other Topics Concern  . Not on file   Social History Narrative  . Not on file   Social Determinants of Health   Financial Resource Strain: Low Risk   . Difficulty of Paying Living Expenses: Not hard at all  Food Insecurity: No Food Insecurity  . Worried About Charity fundraiser in the Last Year: Never true  . Ran Out of Food in the Last Year: Never true  Transportation Needs: No Transportation Needs  . Lack of Transportation (Medical): No  . Lack of Transportation (Non-Medical): No  Physical Activity: Inactive  . Days of Exercise per Week: 0 days  . Minutes of Exercise per Session: 0 min  Stress: Stress Concern Present  . Feeling of Stress : To some extent  Social Connections: Socially Integrated  . Frequency of Communication with Friends and Family: More than three times a week  . Frequency of Social Gatherings with Friends and Family: More than three times a week  . Attends Religious Services: More than 4 times per year  . Active Member of Clubs or Organizations: Yes  . Attends Archivist Meetings: More than 4 times per year  .  Marital Status: Married     Review of Systems: A 12 point ROS discussed and pertinent positives are indicated in the HPI above.  All other systems are negative.  Review of Systems  Constitutional: Negative for chills and fever.  Respiratory: Negative for shortness of breath and wheezing.   Cardiovascular: Negative for chest pain and palpitations.  Gastrointestinal: Positive for abdominal pain. Negative for nausea and vomiting.  Neurological: Negative for headaches.  Psychiatric/Behavioral: Negative for behavioral problems and confusion.    Vital Signs: BP (!) 164/90   Pulse 97   Temp 98.8 F (37.1 C) (Oral)   SpO2 100%   Physical Exam Vitals and nursing note reviewed.  Constitutional:      General: She is not in acute distress.    Appearance: Normal appearance.  Cardiovascular:     Rate and Rhythm: Normal rate and regular rhythm.     Heart sounds: Normal heart  sounds. No murmur heard.   Pulmonary:     Effort: Pulmonary effort is normal. No respiratory distress.     Breath sounds: Normal breath sounds. No wheezing.  Skin:    General: Skin is warm and dry.  Neurological:     Mental Status: She is alert and oriented to person, place, and time.      MD Evaluation Airway: WNL Heart: WNL Abdomen: WNL Chest/ Lungs: WNL ASA  Classification: 3 Mallampati/Airway Score: One   Imaging: CT Chest W Contrast  Result Date: 09/27/2020 CLINICAL DATA:  Incidental nodules on CT of the abdomen and pelvis in the lung bases. EXAM: CT CHEST WITH CONTRAST TECHNIQUE: Multidetector CT imaging of the chest was performed during intravenous contrast administration. CONTRAST:  109mL OMNIPAQUE IOHEXOL 300 MG/ML  SOLN COMPARISON:  November 04, 2018, CT of the chest and abdomen and pelvis CT dated September 21, 2020 FINDINGS: Cardiovascular: Heart size is normal. No pericardial effusion. No aneurysmal dilation of the thoracic aorta no significant aortic atherosclerosis. Central pulmonary vasculature is unremarkable. Mediastinum/Nodes: LEFT thoracic inlet lymph node measures approximately 2.3 cm short axis (image 19, series 2) Retrocrural lymph nodes with enlargement and low posterior mediastinal lymph node adjacent to the esophageal and aortic hiatus with similar appearance to recent abdominal CT, largest approximately 2.6 cm short axis. No axillary lymphadenopathy. No hilar adenopathy. Borderline enlarged lymph nodes in the chest elsewhere, in particular a superior mediastinal lymph node on image 45 of series 2 measuring approximately 11 mm. Lungs/Pleura: Numerous bilateral pulmonary nodules with distribution that could be seen in the setting of metastatic disease. LEFT and RIGHT lung are involved. Nodules are present throughout the chest between 20 and 25 nodules are present in the LEFT lower lobe alone. (Image 131, series 4) 6 mm LEFT lower lobe pulmonary nodule with similar size  adjacent pulmonary nodules on this single image. (Image 129, series 4) 6-7 mm RIGHT lower lobe pulmonary nodule and numerous other nodules throughout the LEFT and RIGHT chest. Largest in the RIGHT upper lobe at 9 mm. No acute cardiopulmonary process. Upper Abdomen: Medial segment LEFT hepatic lobe lesion 13 mm similar to very recent comparison study. No acute upper abdominal process. Musculoskeletal: Spinal degenerative changes. No acute bone finding. No destructive bone process. IMPRESSION: 1. Findings of metastatic disease in the chest with LEFT thoracic inlet lymph node, mediastinal lymph nodes and multiple pulmonary nodules in addition to abdominal findings which were outlined in the recent comparison study. 2. Aortic atherosclerosis. Aortic Atherosclerosis (ICD10-I70.0). Electronically Signed   By: Zetta Bills M.D.   On:  09/27/2020 13:01   CT Abdomen Pelvis W Contrast  Result Date: 09/21/2020 CLINICAL DATA:  Colorectal cancer, status post transverse colon resection EXAM: CT ABDOMEN AND PELVIS WITH CONTRAST TECHNIQUE: Multidetector CT imaging of the abdomen and pelvis was performed using the standard protocol following bolus administration of intravenous contrast. CONTRAST:  165mL OMNIPAQUE IOHEXOL 300 MG/ML SOLN, additional oral enteric contrast COMPARISON:  11/04/2018 FINDINGS: Lower chest: There are numerous new small pulmonary nodules throughout the included lung bases, measuring up to 5 mm (series 6, image 29). There are newly enlarged retrocrural and periaortic lymph nodes in the included lower chest measuring up to 2.6 x 2.0 cm (series 3, image 26). Hepatobiliary: There is a new hypodense lesion of the anterior right lobe of the liver measuring 1.4 x 1.0 cm (series 3, image 40). No gallstones, gallbladder wall thickening, or biliary dilatation. Pancreas: Unremarkable. No pancreatic ductal dilatation or surrounding inflammatory changes. Spleen: Normal in size without significant abnormality.  Adrenals/Urinary Tract: Adrenal glands are unremarkable. Kidneys are normal, without renal calculi, solid lesion, or hydronephrosis. Bladder is unremarkable. Stomach/Bowel: Stomach is within normal limits. Redemonstrated postoperative findings of partial right colectomy. A soft tissue nodule within the central mesentery in the ventral abdomen has substantially increased in size, now encasing the proximal superior mesenteric artery and measuring 5.0 x 4.3 cm, previously 1.9 x 1.7 cm (series 3, image 63). Sigmoid diverticulosis. Vascular/Lymphatic: No significant vascular findings are present. Numerous retroperitoneal lymph nodes, largest aortocaval node measuring 2.8 x 2.2 cm (series 3, image 54). Reproductive: Prominent bilateral uterine and adnexal varices. Other: No abdominal wall hernia or abnormality. No abdominopelvic ascites. Musculoskeletal: No acute or significant osseous findings. IMPRESSION: 1. Redemonstrated postoperative findings of partial right colectomy. 2. A soft tissue nodule within the central mesentery in the ventral abdomen has substantially increased in size, now encasing the proximal superior mesenteric artery and measuring 5.0 x 4.3 cm, previously 1.9 x 1.7 cm. 3. Numerous new small pulmonary nodules throughout the included lung bases, measuring up to 5 mm. 4. There is a new hypodense lesion of the anterior right lobe of the liver measuring 1.4 x 1.0 cm. 5. Numerous newly enlarged retrocrural and periaortic lymph nodes in the included lower chest as well as numerous newly enlarged retroperitoneal lymph nodes. 6. Above constellation of findings is consistent with worsened nodal metastatic disease in addition to new pulmonary and hepatic metastatic disease. Electronically Signed   By: Eddie Candle M.D.   On: 09/21/2020 09:21   US BIOPSY (LIVER)  Result Date: 09/28/2020 INDICATION: 75 year old woman with prior history of colon cancer with recent CT demonstrating findings of metastatic disease  with multifocal lymphadenopathy and right hepatic lesion. She presents today measure radiology for biopsy right hepatic lesion, however the lesion could not be visualized on ultrasound. The lymphadenopathy within the mesentery could be visualized by ultrasound, and was targeted for biopsy. EXAM: Ultrasound-guided biopsy of mesenteric lymphadenopathy MEDICATIONS: None. ANESTHESIA/SEDATION: Moderate (conscious) sedation was employed during this procedure. A total of Versed 2 mg and Fentanyl 100 mcg was administered intravenously. Moderate Sedation Time: 10 minutes. The patient's level of consciousness and vital signs were monitored continuously by radiology nursing throughout the procedure under my direct supervision. COMPLICATIONS: None immediate. PROCEDURE: Informed written consent was obtained from the patient after a thorough discussion of the procedural risks, benefits and alternatives. All questions were addressed. Maximal Sterile Barrier Technique was utilized including caps, mask, sterile gowns, sterile gloves, sterile drape, hand hygiene and skin antiseptic. A timeout was performed prior  to the initiation of the procedure. Patient position supine on the ultrasound table. Midline abdominal skin prepped and draped in usual sterile fashion. Following local lidocaine administration, 17 gauge introducer needle was advanced into the mesenteric lymph node mass, and 4-18 gauge cores were obtained utilizing continuous ultrasound guidance. Samples were sent to pathology in formalin. Needle removed and hemostasis achieved with 2 minutes of manual compression. Post procedure ultrasound images showed no evidence of significant hemorrhage. IMPRESSION: Ultrasound-guided biopsy of mesenteric lymph node mass as above. The hepatic lesions seen on CT could not be identified with ultrasound. Electronically Signed   By: Miachel Roux M.D.   On: 09/28/2020 14:36    Labs:  CBC: Recent Labs    08/26/20 0944 09/17/20 1018  09/28/20 1112  WBC 7.5 7.5 7.6  HGB 13.8 12.9 13.7  HCT 41.5 40.6 42.3  PLT 286 232 236    COAGS: Recent Labs    09/28/20 1112  INR 1.0    BMP: Recent Labs    08/26/20 0944 09/17/20 1018 09/28/20 1112  NA 139 137 141  K 4.1 4.0 4.0  CL 104 103 105  CO2 27 26 25   GLUCOSE 98 98 109*  BUN 12 14 10   CALCIUM 9.6 9.6 9.6  CREATININE 0.68 0.68 0.52  GFRNONAA  --  >60 >60    LIVER FUNCTION TESTS: Recent Labs    08/26/20 0944 09/17/20 1018 09/28/20 1112  BILITOT 0.3 0.3 0.8  AST 19 21 22   ALT 13 14 16   ALKPHOS  --  66 76  PROT 7.6 7.4 8.2*  ALBUMIN  --  3.9 4.2    TUMOR MARKERS: Recent Labs    08/26/20 0944  CEA 7.7*    Assessment and Plan:  Metastatic colon cancer, in need of lymph node biopsy for tissue diagnosis, along with durable IV access for systemic chemotherapy. Plan for image-guided left supraclavicular lymph node biopsy along with image-guided Port-a-cath placement today in IR. Patient is NPO. Afebrile.  Risks and benefits discussed with the patient including, but not limited to bleeding, infection, damage to adjacent structures or low yield requiring additional tests. All of the patient's questions were answered, patient is agreeable to proceed. Consent signed and in chart.  Risks and benefits of image-guided Port-a-catheter placement were discussed with the patient including, but not limited to bleeding, infection, pneumothorax, or fibrin sheath development and need for additional procedures. All of the patient's questions were answered, patient is agreeable to proceed. Consent signed and in chart.   Thank you for this interesting consult.  I greatly enjoyed meeting MAHEK SCHLESINGER and look forward to participating in their care.  A copy of this report was sent to the requesting provider on this date.  Electronically Signed: Earley Abide, PA-C 10/14/2020, 11:23 AM   I spent a total of  25 Minutes in face to face in clinical consultation,  greater than 50% of which was counseling/coordinating care for metastatic colon cancer/left supraclavicular lymph node biopsy and Port-a-cath placement.

## 2020-10-14 NOTE — Discharge Instructions (Signed)
Please call Interventional Radiology clinic [phone] with any questions or concerns.  You may remove your dressing and shower tomorrow.  DO NOT use EMLA cream for 2 weeks after port placement as this cream will remove surgical glue on your incision.   Implanted Port Insertion, Care After This sheet gives you information about how to care for yourself after your procedure. Your health care provider may also give you more specific instructions. If you have problems or questions, contact your health care provider. What can I expect after the procedure? After the procedure, it is common to have:  Discomfort at the port insertion site.  Bruising on the skin over the port. This should improve over 3-4 days. Follow these instructions at home: Port care  After your port is placed, you will get a manufacturer's information card. The card has information about your port. Keep this card with you at all times.  Take care of the port as told by your health care provider. Ask your health care provider if you or a family member can get training for taking care of the port at home. A home health care nurse may also take care of the port.  Make sure to remember what type of port you have. Incision care  Follow instructions from your health care provider about how to take care of your port insertion site. Make sure you: ? Wash your hands with soap and water before and after you change your bandage (dressing). If soap and water are not available, use hand sanitizer. ? Change your dressing as told by your health care provider. ? Leave stitches (sutures), skin glue, or adhesive strips in place. These skin closures may need to stay in place for 2 weeks or longer. If adhesive strip edges start to loosen and curl up, you may trim the loose edges. Do not remove adhesive strips completely unless your health care provider tells you to do that.  Check your port insertion site every day for signs of infection.  Check for: ? Redness, swelling, or pain. ? Fluid or blood. ? Warmth. ? Pus or a bad smell.      Activity  Return to your normal activities as told by your health care provider. Ask your health care provider what activities are safe for you.  Do not lift anything that is heavier than 10 lb (4.5 kg), or the limit that you are told, until your health care provider says that it is safe. General instructions  Take over-the-counter and prescription medicines only as told by your health care provider.  Do not take baths, swim, or use a hot tub until your health care provider approves. Ask your health care provider if you may take showers. You may only be allowed to take sponge baths.  Do not drive for 24 hours if you were given a sedative during your procedure.  Wear a medical alert bracelet in case of an emergency. This will tell any health care providers that you have a port.  Keep all follow-up visits as told by your health care provider. This is important. Contact a health care provider if:  You cannot flush your port with saline as directed, or you cannot draw blood from the port.  You have a fever or chills.  You have redness, swelling, or pain around your port insertion site.  You have fluid or blood coming from your port insertion site.  Your port insertion site feels warm to the touch.  You have pus or a   bad smell coming from the port insertion site. Get help right away if:  You have chest pain or shortness of breath.  You have bleeding from your port that you cannot control. Summary  Take care of the port as told by your health care provider. Keep the manufacturer's information card with you at all times.  Change your dressing as told by your health care provider.  Contact a health care provider if you have a fever or chills or if you have redness, swelling, or pain around your port insertion site.  Keep all follow-up visits as told by your health care  provider. This information is not intended to replace advice given to you by your health care provider. Make sure you discuss any questions you have with your health care provider. Document Revised: 01/22/2018 Document Reviewed: 01/22/2018 Elsevier Patient Education  2021 [place].   Needle Biopsy, Care After These instructions tell you how to care for yourself after your procedure. Your doctor may also give you more specific instructions. Call your doctor if you have any problems or questions. What can I expect after the procedure? After the procedure, it is common to have:  Soreness.  Bruising.  Mild pain. Follow these instructions at home:  Return to your normal activities as told by your doctor. Ask your doctor what activities are safe for you.  Take over-the-counter and prescription medicines only as told by your doctor.  Wash your hands with soap and water before you change your bandage (dressing). If you cannot use soap and water, use hand sanitizer.  Follow instructions from your doctor about: ? How to take care of your puncture site. ? When and how to change your bandage. ? When to remove your bandage.  Check your puncture site every day for signs of infection. Watch for: ? Redness, swelling, or pain. ? Fluid or blood. ? Pus or a bad smell. ? Warmth.  Do not take baths, swim, or use a hot tub until your doctor approves. Ask your doctor if you may take showers. You may only be allowed to take sponge baths.  Keep all follow-up visits as told by your doctor. This is important.   Contact a doctor if you have:  A fever.  Redness, swelling, or pain at the puncture site, and it lasts longer than a few days.  Fluid, blood, or pus coming from the puncture site.  Warmth coming from the puncture site. Get help right away if:  You have a lot of bleeding from the puncture site. Summary  After the procedure, it is common to have soreness, bruising, or mild pain at  the puncture site.  Check your puncture site every day for signs of infection, such as redness, swelling, or pain.  Get help right away if you have severe bleeding from your puncture site. This information is not intended to replace advice given to you by your health care provider. Make sure you discuss any questions you have with your health care provider. Document Revised: 12/25/2019 Document Reviewed: 12/25/2019 Elsevier Patient Education  2021 [place].    Moderate Conscious Sedation, Adult, Care After This sheet gives you information about how to care for yourself after your procedure. Your health care provider may also give you more specific instructions. If you have problems or questions, contact your health care provider. What can I expect after the procedure? After the procedure, it is common to have:  Sleepiness for several hours.  Impaired judgment for several hours.  Difficulty with balance.  Vomiting if you eat too soon. Follow these instructions at home: For the time period you were told by your health care provider:  Rest.  Do not participate in activities where you could fall or become injured.  Do not drive or use machinery.  Do not drink alcohol.  Do not take sleeping pills or medicines that cause drowsiness.  Do not make important decisions or sign legal documents.  Do not take care of children on your own.      Eating and drinking  Follow the diet recommended by your health care provider.  Drink enough fluid to keep your urine pale yellow.  If you vomit: ? Drink water, juice, or soup when you can drink without vomiting. ? Make sure you have little or no nausea before eating solid foods.   General instructions  Take over-the-counter and prescription medicines only as told by your health care provider.  Have a responsible adult stay with you for the time you are told. It is important to have someone help care for you until you are awake and  alert.  Do not smoke.  Keep all follow-up visits as told by your health care provider. This is important. Contact a health care provider if:  You are still sleepy or having trouble with balance after 24 hours.  You feel light-headed.  You keep feeling nauseous or you keep vomiting.  You develop a rash.  You have a fever.  You have redness or swelling around the IV site. Get help right away if:  You have trouble breathing.  You have new-onset confusion at home. Summary  After the procedure, it is common to feel sleepy, have impaired judgment, or feel nauseous if you eat too soon.  Rest after you get home. Know the things you should not do after the procedure.  Follow the diet recommended by your health care provider and drink enough fluid to keep your urine pale yellow.  Get help right away if you have trouble breathing or new-onset confusion at home. This information is not intended to replace advice given to you by your health care provider. Make sure you discuss any questions you have with your health care provider. Document Revised: 10/24/2019 Document Reviewed: 05/22/2019 Elsevier Patient Education  2021 [employer].

## 2020-10-15 LAB — SURGICAL PATHOLOGY

## 2020-10-21 ENCOUNTER — Inpatient Hospital Stay (HOSPITAL_COMMUNITY): Payer: Medicare Other | Attending: Hematology | Admitting: Hematology

## 2020-10-21 ENCOUNTER — Other Ambulatory Visit: Payer: Self-pay

## 2020-10-21 VITALS — BP 151/77 | HR 82 | Temp 97.2°F | Resp 18 | Wt 155.2 lb

## 2020-10-21 DIAGNOSIS — C7802 Secondary malignant neoplasm of left lung: Secondary | ICD-10-CM | POA: Diagnosis not present

## 2020-10-21 DIAGNOSIS — C184 Malignant neoplasm of transverse colon: Secondary | ICD-10-CM | POA: Diagnosis not present

## 2020-10-21 DIAGNOSIS — C770 Secondary and unspecified malignant neoplasm of lymph nodes of head, face and neck: Secondary | ICD-10-CM | POA: Diagnosis not present

## 2020-10-21 DIAGNOSIS — C7801 Secondary malignant neoplasm of right lung: Secondary | ICD-10-CM | POA: Insufficient documentation

## 2020-10-21 DIAGNOSIS — C787 Secondary malignant neoplasm of liver and intrahepatic bile duct: Secondary | ICD-10-CM | POA: Diagnosis not present

## 2020-10-21 DIAGNOSIS — Z87891 Personal history of nicotine dependence: Secondary | ICD-10-CM | POA: Diagnosis not present

## 2020-10-21 DIAGNOSIS — Z9221 Personal history of antineoplastic chemotherapy: Secondary | ICD-10-CM | POA: Diagnosis not present

## 2020-10-21 NOTE — Progress Notes (Signed)
[place], [place] [address]   CLINIC:  Medical Oncology/Hematology  PCP:  [facility], [place] / [place] [address] [phone]   REASON FOR VISIT:  Follow-up for transverse colon adenocarcinoma  PRIOR THERAPY:  1. Right hemicolectomy on 12/29/2013. 2. FOLFOX x 3 cycles from 02/10/2014 to 03/10/2014, stopped secondary to poor tolerance.  NGS Results: Not done  CURRENT THERAPY: Under work-up  BRIEF ONCOLOGIC HISTORY:  Oncology History  Adenocarcinoma of transverse colon ([place]) (Resolved)  12/09/2013 Initial Diagnosis   1. Colon, biopsy, proximal transverse mass INVASIVE ADENOCARCINOMA.   12/29/2013 Definitive Surgery   Colon, segmental resection for tumor, right - INVASIVE COLORECTAL ADENOCARCINOMA, 7 CM, EXTENDING INTO PERICOLONIC CONNECTIVE TISSUE. - METASTATIC CARCINOMA IN 6 OF 33 LYMPH NODES (6/33).   02/10/2014 - 03/10/2014 Adjuvant Chemotherapy   FOLFOX x 3 cycles   03/11/2014 Adverse Reaction   Patient called reporting diarrhea despite dose reduction and she wants to stop therapy.  Patient seen on 03/24/14 to discuss other treatment options and she stands by her decision to stop all therapy.   04/01/2014 Surgery   Port-a-cath removal by Dr. Arnoldo Morale.   01/12/2015 PET scan   Mild abdominal mesenteric LAD show hypermetabolic activity, 7 mm indeterminate pulm nodule in sup segment of RLL shows no assoc metabolic activity   8/93/7342 PET scan   Mild progression of nodal mets in anterior mid abdominal mesentery, new nodal mets at L thoracic inlet. stable 6 mm nodule in posterior RLL, unchanged since 2015   06/22/2015 PET scan   Resolution of metabolic activity and decreased size of mild LAD at L thoracic inlet, stable mild mid abd hypermetabolic mesenteric LAD, stable 6 mm posterior LLL pulm nodule without metabolic activity   8/76/8115 Imaging   MRI lumbar spine L4-L5 disc degenerated, broad based disc hernation, B  facet arthropathy with hypertophy and edema, stenosis of lateral recesses that could cause neural compression   10/18/2016 Imaging   CT CAP- 1. Several small ground-glass pulmonary nodules are present in the lungs and are stable over the past 9 months, but several are mildly larger than they were in 2015. Low-grade adenocarcinoma can sometimes have this appearance and surveillance is likely warranted. 2. There is a cluster of nodal tissue in the central mesentery, maximum short axis diameter 1.3 cm. This nodal cluster is relatively low in density and not appreciably changed from the prior exam. Given the lack of change is may represent quiescent residua of malignancy, but again, surveillance is likely warranted. 3. The left-sided rib fractures are more numerous than was revealed on the prior radiographs ; there are nondisplaced healing fractures of the left fifth, sixth, seventh, eighth, ninth, and tenth ribs. 4. Mild cardiomegaly although part of this appearance may be due to pectus excavatum. 5. Several additional small pulmonary nodules are stable from 2015 and considered benign. 6. Right hemicolectomy and sigmoid colon diverticulosis. 7. Small right paraumbilical hernia containing adipose tissue. 8. Lower lumbar spondylosis and degenerative disc disease potentially with mild impingement at L4-5.   04/25/2017 Imaging   CT C/A/P: Scattered solid pulmonary nodules measure up to 6 mm in the right lower lobe, unchanged. There are a few scattered ground-glass nodules measuring up to 8 mm in the right upper lobe, also stable. Distal perigastric lymph nodes are stable. No additional evidence of metastatic disease.     CANCER STAGING: Cancer Staging No matching staging information was found for the patient.  INTERVAL HISTORY:  Ms. KAILIN PRINCIPATO, a 75 y.o. female, returns for routine follow-up of her transverse colon adenocarcinoma. Claudean was last seen on 10/04/2020.   Today she is  accompanied by her son and she reports feeling okay. She tolerated the biopsy well. She reports that every week her energy levels decrease more and more. Her diarrhea has improved with 1 tablet of Imodium BID, though she still has watery diarrhea. She denies having any new pains. She continues having pain across her entire upper abdomen after eating. She notes that she developed fatigue, diarrhea and cold sensitivity in her arms when she was treated with FOLFOX in 2015.   REVIEW OF SYSTEMS:  Review of Systems  Constitutional: Positive for fatigue (25%). Negative for appetite change.  Cardiovascular: Positive for palpitations (w/ exertion).  Gastrointestinal: Positive for abdominal pain (pain across upper half after eating).  Psychiatric/Behavioral: The patient is nervous/anxious.   All other systems reviewed and are negative.   PAST MEDICAL/SURGICAL HISTORY:  Past Medical History:  Diagnosis Date  . Arthritis    wrist and thumbs  . Colon cancer ([place])   . PONV (postoperative nausea and vomiting)   . Ruptured lumbar disc    L1-L5 per patient report  . Sciatica of left side    Past Surgical History:  Procedure Laterality Date  . COLONOSCOPY N/A 12/12/2013   Procedure: COLONOSCOPY;  Surgeon: Rogene Houston, MD;  [facility];  Service: Endoscopy;  Laterality: N/A;  730  . COLONOSCOPY N/A 01/20/2015   Procedure: COLONOSCOPY;  Surgeon: Rogene Houston, MD;  [facility];  Service: Endoscopy;  Laterality: N/A;  1030  . COLONOSCOPY N/A 03/28/2018   Procedure: COLONOSCOPY;  Surgeon: Rogene Houston, MD;  [facility];  Service: Endoscopy;  Laterality: N/A;  1015  . EUS N/A 12/18/2013   Procedure: UPPER ENDOSCOPIC ULTRASOUND (EUS) LINEAR;  Surgeon: Milus Banister, MD;  Location: [facility];  Service: Endoscopy;  Laterality: N/A;  . herniated disc     [id number]  . IR IMAGING GUIDED PORT INSERTION  10/14/2020  . IR US GUIDANCE  10/14/2020  . PARTIAL COLECTOMY  N/A 12/29/2013   Procedure: RIGHT HEMICOLECTOMY;  Surgeon: Jamesetta So, MD;  Location: [facility];  Service: General;  Laterality: N/A;  . POLYPECTOMY  03/28/2018   Procedure: POLYPECTOMY;  Surgeon: Rogene Houston, MD;  [facility];  Service: Endoscopy;;  transverse colon (hot snare);  Marland Kitchen PORT-A-CATH REMOVAL Right 04/01/2014   Procedure: MINOR REMOVAL PORT-A-CATH;  Surgeon: Jamesetta So, MD;  Location: [facility];  Service: General;  Laterality: Right;  . PORTACATH PLACEMENT Right 02/04/2014   Procedure: INSERTION PORT-A-CATH;  Surgeon: Jamesetta So, MD;  Location: [facility];  Service: General;  Laterality: Right;    SOCIAL HISTORY:  Social History   Socioeconomic History  . Marital status: Married    Spouse name: Not on file  . Number of children: Not on file  . Years of education: Not on file  . Highest education level: Not on file  Occupational History  . Not on file  Tobacco Use  . Smoking status: Former Smoker    Packs/day: 0.25    Years: 5.00    Pack years: 1.25    Types: Cigarettes    Quit date: 04/19/1959    Years since quitting: 61.5  . Smokeless tobacco: Never Used  . Tobacco comment: smoked for 5 years as teenager  Vaping Use  . Vaping Use: Never used  Substance and Sexual Activity  . Alcohol use: No  . Drug use: No  . Sexual activity: Yes    Birth control/protection: Post-menopausal  Other Topics Concern  . Not on file  Social History Narrative  . Not on file   Social Determinants of Health   Financial Resource Strain: Low Risk   . Difficulty of Paying Living Expenses: Not hard at all  Food Insecurity: No Food Insecurity  . Worried About Charity fundraiser in the Last Year: Never true  . Ran Out of Food in the Last Year: Never true  Transportation Needs: No Transportation Needs  . Lack of Transportation (Medical): No  . Lack of Transportation (Non-Medical): No  Physical Activity: Inactive  . Days of Exercise per Week: 0 days  . Minutes of  Exercise per Session: 0 min  Stress: Stress Concern Present  . Feeling of Stress : To some extent  Social Connections: Socially Integrated  . Frequency of Communication with Friends and Family: More than three times a week  . Frequency of Social Gatherings with Friends and Family: More than three times a week  . Attends Religious Services: More than 4 times per year  . Active Member of Clubs or Organizations: Yes  . Attends Archivist Meetings: More than 4 times per year  . Marital Status: Married  Human resources officer Violence: Not At Risk  . Fear of Current or Ex-Partner: No  . Emotionally Abused: No  . Physically Abused: No  . Sexually Abused: No    FAMILY HISTORY:  Family History  Problem Relation Age of Onset  . Diabetes type II Mother   . Dementia Father     CURRENT MEDICATIONS:  Current Outpatient Medications  Medication Sig Dispense Refill  . acetaminophen (TYLENOL) 500 MG tablet Take 500 mg by mouth every 6 (six) hours as needed for moderate pain or headache.    Marland Kitchen aspirin 81 MG EC tablet Take 1 tablet (81 mg total) by mouth daily. Swallow whole. 30 tablet 12  . Bioflavonoid Products (ESTER C PO) Take 1,000 mg by mouth daily.    . Cholecalciferol (D3-1000) 25 MCG (1000 UT) capsule Take 125 Units by mouth daily.    . Cranberry-Vitamin C-Vitamin E 4200-20-3 MG-MG-UNIT CAPS Take 500 mg by mouth daily.    . Garlic 0623 MG CAPS Take 1,000 mg by mouth daily.     Marland Kitchen LACTOBACILLUS PO Take 1 tablet by mouth daily.    Marland Kitchen loperamide (IMODIUM A-D) 2 MG tablet Take 2 mg by mouth 4 (four) times daily as needed for diarrhea or loose stools.    . Multiple Vitamin (MULTIVITAMIN) tablet Take 1 tablet by mouth daily.    . Omega-3 Fatty Acids (FISH OIL) 1200 MG CAPS Take 1,000 mg by mouth daily.     No current facility-administered medications for this visit.    ALLERGIES:  Allergies  Allergen Reactions  . Sulfa Antibiotics Other (See Comments)    "Sores in my mouth"     PHYSICAL EXAM:  Performance status (ECOG): 1 - Symptomatic but completely ambulatory  Vitals:   10/21/20 0926  BP: (!) 151/77  Pulse: 82  Resp: 18  Temp: (!) 97.2 F (36.2 C)  SpO2: 98%   Wt Readings from Last 3 Encounters:  10/21/20 155 lb 3.2 oz (70.4 kg)  10/04/20 157 lb 8 oz (71.4 kg)  09/22/20 156 lb 3.2 oz (70.9 kg)   Physical Exam Vitals reviewed.  Constitutional:      Appearance:  Normal appearance.  Cardiovascular:     Rate and Rhythm: Normal rate and regular rhythm.     Pulses: Normal pulses.     Heart sounds: Normal heart sounds.  Pulmonary:     Effort: Pulmonary effort is normal.     Breath sounds: Normal breath sounds.  Chest:  Breasts:     Right: No axillary adenopathy or supraclavicular adenopathy.     Left: Supraclavicular adenopathy present. No axillary adenopathy.    Abdominal:     Palpations: Abdomen is soft. There is no hepatomegaly, splenomegaly or mass.     Tenderness: There is no abdominal tenderness.  Musculoskeletal:     Right lower leg: No edema.     Left lower leg: No edema.  Lymphadenopathy:     Upper Body:     Right upper body: No supraclavicular, axillary or pectoral adenopathy.     Left upper body: Supraclavicular adenopathy present. No axillary or pectoral adenopathy.  Neurological:     General: No focal deficit present.     Mental Status: She is alert and oriented to person, place, and time.  Psychiatric:        Mood and Affect: Mood normal.        Behavior: Behavior normal.      LABORATORY DATA:  I have reviewed the labs as listed.  CBC Latest Ref Rng & Units 09/28/2020 09/17/2020 08/26/2020  WBC 4.0 - 10.5 K/uL 7.6 7.5 7.5  Hemoglobin 12.0 - 15.0 g/dL 13.7 12.9 13.8  Hematocrit 36.0 - 46.0 % 42.3 40.6 41.5  Platelets 150 - 400 K/uL 236 232 286   CMP Latest Ref Rng & Units 09/28/2020 09/17/2020 08/26/2020  Glucose 70 - 99 mg/dL 109(H) 98 98  BUN 8 - 23 mg/dL 10 14 12   Creatinine 0.44 - 1.00 mg/dL 0.52 0.68 0.68  Sodium  135 - 145 mmol/L 141 137 139  Potassium 3.5 - 5.1 mmol/L 4.0 4.0 4.1  Chloride 98 - 111 mmol/L 105 103 104  CO2 22 - 32 mmol/L 25 26 27   Calcium 8.9 - 10.3 mg/dL 9.6 9.6 9.6  Total Protein 6.5 - 8.1 g/dL 8.2(H) 7.4 7.6  Total Bilirubin 0.3 - 1.2 mg/dL 0.8 0.3 0.3  Alkaline Phos 38 - 126 U/L 76 66 -  AST 15 - 41 U/L 22 21 19   ALT 0 - 44 U/L 16 14 13    Surgical pathology ([id number]) on 10/14/2020: Left supraclavicular lymph node biopsy: soft tissue mucin, lymphoid tissue is not present.  DIAGNOSTIC IMAGING:  I have independently reviewed the scans and discussed with the patient. CT Chest W Contrast  Result Date: 09/27/2020 CLINICAL DATA:  Incidental nodules on CT of the abdomen and pelvis in the lung bases. EXAM: CT CHEST WITH CONTRAST TECHNIQUE: Multidetector CT imaging of the chest was performed during intravenous contrast administration. CONTRAST:  7mL OMNIPAQUE IOHEXOL 300 MG/ML  SOLN COMPARISON:  November 04, 2018, CT of the chest and abdomen and pelvis CT dated September 21, 2020 FINDINGS: Cardiovascular: Heart size is normal. No pericardial effusion. No aneurysmal dilation of the thoracic aorta no significant aortic atherosclerosis. Central pulmonary vasculature is unremarkable. Mediastinum/Nodes: LEFT thoracic inlet lymph node measures approximately 2.3 cm short axis (image 19, series 2) Retrocrural lymph nodes with enlargement and low posterior mediastinal lymph node adjacent to the esophageal and aortic hiatus with similar appearance to recent abdominal CT, largest approximately 2.6 cm short axis. No axillary lymphadenopathy. No hilar adenopathy. Borderline enlarged lymph nodes in the chest elsewhere, in particular a  superior mediastinal lymph node on image 45 of series 2 measuring approximately 11 mm. Lungs/Pleura: Numerous bilateral pulmonary nodules with distribution that could be seen in the setting of metastatic disease. LEFT and RIGHT lung are involved. Nodules are present throughout  the chest between 20 and 25 nodules are present in the LEFT lower lobe alone. (Image 131, series 4) 6 mm LEFT lower lobe pulmonary nodule with similar size adjacent pulmonary nodules on this single image. (Image 129, series 4) 6-7 mm RIGHT lower lobe pulmonary nodule and numerous other nodules throughout the LEFT and RIGHT chest. Largest in the RIGHT upper lobe at 9 mm. No acute cardiopulmonary process. Upper Abdomen: Medial segment LEFT hepatic lobe lesion 13 mm similar to very recent comparison study. No acute upper abdominal process. Musculoskeletal: Spinal degenerative changes. No acute bone finding. No destructive bone process. IMPRESSION: 1. Findings of metastatic disease in the chest with LEFT thoracic inlet lymph node, mediastinal lymph nodes and multiple pulmonary nodules in addition to abdominal findings which were outlined in the recent comparison study. 2. Aortic atherosclerosis. Aortic Atherosclerosis (ICD10-I70.0). Electronically Signed   By: Zetta Bills M.D.   On: 09/27/2020 13:01   US BIOPSY (LIVER)  Result Date: 09/28/2020 INDICATION: 75 year old woman with prior history of colon cancer with recent CT demonstrating findings of metastatic disease with multifocal lymphadenopathy and right hepatic lesion. She presents today measure radiology for biopsy right hepatic lesion, however the lesion could not be visualized on ultrasound. The lymphadenopathy within the mesentery could be visualized by ultrasound, and was targeted for biopsy. EXAM: Ultrasound-guided biopsy of mesenteric lymphadenopathy MEDICATIONS: None. ANESTHESIA/SEDATION: Moderate (conscious) sedation was employed during this procedure. A total of Versed 2 mg and Fentanyl 100 mcg was administered intravenously. Moderate Sedation Time: 10 minutes. The patient's level of consciousness and vital signs were monitored continuously by radiology nursing throughout the procedure under my direct supervision. COMPLICATIONS: None immediate.  PROCEDURE: Informed written consent was obtained from the patient after a thorough discussion of the procedural risks, benefits and alternatives. All questions were addressed. Maximal Sterile Barrier Technique was utilized including caps, mask, sterile gowns, sterile gloves, sterile drape, hand hygiene and skin antiseptic. A timeout was performed prior to the initiation of the procedure. Patient position supine on the ultrasound table. Midline abdominal skin prepped and draped in usual sterile fashion. Following local lidocaine administration, 17 gauge introducer needle was advanced into the mesenteric lymph node mass, and 4-18 gauge cores were obtained utilizing continuous ultrasound guidance. Samples were sent to pathology in formalin. Needle removed and hemostasis achieved with 2 minutes of manual compression. Post procedure ultrasound images showed no evidence of significant hemorrhage. IMPRESSION: Ultrasound-guided biopsy of mesenteric lymph node mass as above. The hepatic lesions seen on CT could not be identified with ultrasound. Electronically Signed   By: Miachel Roux M.D.   On: 09/28/2020 14:36   IR US Guidance  Result Date: 10/14/2020 INDICATION: 75 year old with history of colon cancer. Recent imaging is suggestive for metastatic disease and patient had an inconclusive mesenteric lymph node biopsy. Patient also has a suspicious left supraclavicular lymph node. EXAM: ULTRASOUND-GUIDED CORE BIOPSY OF LEFT SUPRACLAVICULAR LYMPH NODE MEDICATIONS: Moderate sedation ANESTHESIA/SEDATION: Moderate (conscious) sedation was employed during this procedure. A total of Versed 4.0 mg and Fentanyl 100 mcg was administered intravenously. Moderate Sedation Time: 21 minutes. The patient's level of consciousness and vital signs were monitored continuously by radiology nursing throughout the procedure under my direct supervision. FLUOROSCOPY TIME:  None COMPLICATIONS: None immediate. PROCEDURE: Informed written  consent  was obtained from the patient after a thorough discussion of the procedural risks, benefits and alternatives. All questions were addressed. A timeout was performed prior to the initiation of the procedure. Left side of the neck was evaluated with ultrasound. An enlarged left supraclavicular lymph node was identified. The skin was prepped with chlorhexidine and sterile field was created. Maximal barrier sterile technique was utilized including caps, mask, sterile gowns, sterile gloves, sterile drape, hand hygiene and skin antiseptic. Skin was anesthetized using 1% lidocaine. Small incision was made. Using ultrasound guidance, an 18 gauge core device was directed into the left supraclavicular lymph node. Multiple core biopsies were obtained. Specimens placed in saline. Bandage placed over the puncture site. FINDINGS: Heterogeneous hypoechoic lymph node lateral to the left internal jugular vein. Findings correspond with the previous CT findings. Echotexture is similar to the mesenteric lesion that was previously biopsied. IMPRESSION: Ultrasound-guided core biopsies of the abnormal left supraclavicular lymph node. Electronically Signed   By: Markus Daft M.D.   On: 10/14/2020 17:00   IR IMAGING GUIDED PORT INSERTION  Result Date: 10/14/2020 INDICATION: 75 year old with history of colon cancer and evidence for metastatic disease on recent imaging. EXAM: FLUOROSCOPIC AND ULTRASOUND GUIDED PLACEMENT OF A SUBCUTANEOUS PORT COMPARISON:  None. MEDICATIONS: Moderate sedation ANESTHESIA/SEDATION: Fentanyl 100 mcg. Patient was given fentanyl and Versed immediately prior to this procedure for the left neck lymph node biopsy. Moderate Sedation Time:  27 minutes The patient was continuously monitored during the procedure by the interventional radiology nurse under my direct supervision. FLUOROSCOPY TIME:  24 seconds, 2 mGy COMPLICATIONS: None immediate. PROCEDURE: The procedure, risks, benefits, and alternatives were explained to  the patient. Questions regarding the procedure were encouraged and answered. The patient understands and consents to the procedure. Patient was placed supine on the interventional table. Ultrasound confirmed a patent right internal jugular vein. Ultrasound image was saved for documentation. The right chest and neck were cleaned with a skin antiseptic and a sterile drape was placed. Maximal barrier sterile technique was utilized including caps, mask, sterile gowns, sterile gloves, sterile drape, hand hygiene and skin antiseptic. The right neck was anesthetized with 1% lidocaine. Small incision was made in the right neck with a blade. Micropuncture set was placed in the right internal jugular vein with ultrasound guidance. The micropuncture wire was used for measurement purposes. The right chest was anesthetized with 1% lidocaine with epinephrine. #15 blade was used to make an incision and a subcutaneous port pocket was formed. [place] was assembled. Subcutaneous tunnel was formed with a stiff tunneling device. The port catheter was brought through the subcutaneous tunnel. The port was placed in the subcutaneous pocket and sutured in place. The micropuncture set was exchanged for a peel-away sheath. The catheter was placed through the peel-away sheath and the tip was positioned at the superior cavoatrial junction. Catheter placement was confirmed with fluoroscopy. The port was accessed and flushed with heparinized saline. The port pocket was closed using two layers of absorbable sutures and Dermabond. The vein skin site was closed using a single layer of absorbable suture and Dermabond. Sterile dressings were applied. Patient tolerated the procedure well without an immediate complication. Ultrasound and fluoroscopic images were taken and saved for this procedure. IMPRESSION: Placement of a subcutaneous port device. Catheter tip at the superior cavoatrial junction. Electronically Signed   By: Markus Daft  M.D.   On: 10/14/2020 17:03     ASSESSMENT:  1. Stage IIIb (PT3PN2A) adenocarcinoma the proximal transverse  colon: -Status post colectomy on 12/29/2013, 6/33+ lymph nodes, free margins, grade 2, no lymphovascular or perineural invasion. -Status post 3 cycles of FOLFOX from 02/10/2014 through 03/10/2014, discontinued secondary to poor tolerance. DPD was negative. -Last colonoscopy on 03/28/2018 patent ileo-colonic anastomosis. 8 mm polyp in the transverse colon. Diverticulosis in the sigmoid colon, descending colon. -CT scan on 11/04/2018 shows postoperative findings of right colectomy with redemonstrated mesenteric nodule/lymph node measuring 1.9 x 1.7 cm, unchanged from prior scan in October 2019. No evidence of metastatic disease. Stable solid and groundglass pulmonary nodules, stable over multiple prior exams. -CEA was 4.6 on 10/28/2018. -CTAP on 09/21/2020 showed numerous small pulmonary nodules throughout the lung bases, 5 mm.  Enlarged retrocrural periaortic lymph node in the lower chest.  Soft tissue nodule within the central mesentery substantially increased in size, encasing proximal superior mesenteric artery and measuring 5 x 4.3 cm.  Numerous retroperitoneal lymph nodes, largest aortocaval lymph node measuring 2.8 x 2.2 cm. -CT chest on 09/27/2020 shows a left thoracic inlet lymph node approximately 2.3 cm.  Mediastinal lymph nodes and multiple subcentimeter pulmonary nodules suggestive of metastatic disease. -Mesenteric lymph node biopsy on 09/28/2020 shows mucin and fibrosis. -Left supraclavicular lymph node biopsy on 10/14/2020-soft tissue with abundant mucin, rare fragments of atypical columnar epithelium.  2. Health maintenance: -Mammogram dated 05/16/2018 was BI-RADS Category 1.   PLAN:  1.Metastatic colon cancer to the liver, lungs and lymph nodes: -I have reviewed left supraclavicular lymph node biopsy with the patient and her son Shanon Brow in detail. -Biopsy shows soft tissue,  abundant mucin and rare fragments of atypical columnar epithelium. -These findings are highly suggestive of metastatic colorectal cancer although not definitive. -We will require pathological confirmation for additional molecular testing.  Her previous sample was from 7 years ago which precludes as from doing next generation sequencing due to old sample. -Plasma based NGS is not very reliable. -Hence I have recommended doing biopsy again.  I have recommended doing a PET CT scan to identify the area of biopsy. -It is possible that her tumor is producing a lot of mucin and causing fibrosis. -She has a port placed and is eager to proceed with treatment.  I have discussed the importance of biopsy for next generation sequencing which will open further treatment options. -We will also consider excision biopsy option based on the PET scan findings.  2.Diarrhea: -This is well controlled with Imodium 2 tablets/day.   Orders placed this encounter:  Orders Placed This Encounter  Procedures  . NM PET Image Restag (PS) Skull Base To Thigh     Derek Jack, MD [place] [phone]   I, Milinda Antis, am acting as a scribe for Dr. Sanda Linger.  I, Derek Jack MD, have reviewed the above documentation for accuracy and completeness, and I agree with the above.

## 2020-10-21 NOTE — Progress Notes (Signed)
Patient assessed by Dr Delton Coombes.  Treatment plan discussed.  Pt and family verbalize understanding.  Pet scan as soon as possible, followed by biopsy and treatment.

## 2020-10-21 NOTE — Patient Instructions (Signed)
[place] at [facility] Discharge Instructions  You were seen today by Dr. Delton Coombes. He went over your recent results. You will be scheduled to have a PET scan done before your next visit to determine a suitable site to biopsy. Your chemo treatment will consist of FOLFIRI and Avastin, given every 2 weeks. Dr. Delton Coombes will see you back on your first day of treatment for labs and follow up.   Thank you for choosing [place] at [facility] ([facility]) to provide your oncology and hematology care.  To afford each patient quality time with our provider, please arrive at least 15 minutes before your scheduled appointment time.   If you have a lab appointment with the [facility] please come in thru the Main Entrance and check in at the main information desk  You need to re-schedule your appointment should you arrive 10 or more minutes late.  We strive to give you quality time with our providers, and arriving late affects you and other patients whose appointments are after yours.  Also, if you no show three or more times for appointments you may be dismissed from the clinic at the providers discretion.     Again, thank you for choosing [facility].  Our hope is that these requests will decrease the amount of time that you wait before being seen by our physicians.       _____________________________________________________________  Should you have questions after your visit to [facility], please contact our office at [phone] between the hours of 8:00 a.m. and 4:30 p.m.  Voicemails left after 4:00 p.m. will not be returned until the following business day.  For prescription refill requests, have your pharmacy contact our office and allow 72 hours.    Cancer Center Support Programs:   > Cancer Support Group  2nd Tuesday of the month 1pm-2pm, Journey Room

## 2020-10-26 ENCOUNTER — Ambulatory Visit (HOSPITAL_COMMUNITY): Payer: Medicare Other | Admitting: Hematology

## 2020-11-01 ENCOUNTER — Ambulatory Visit (HOSPITAL_COMMUNITY)
Admission: RE | Admit: 2020-11-01 | Discharge: 2020-11-01 | Disposition: A | Discharge status: Home / Self Care | Payer: Medicare Other | Source: Ambulatory Visit | Attending: Hematology | Admitting: Hematology

## 2020-11-01 ENCOUNTER — Other Ambulatory Visit: Payer: Self-pay

## 2020-11-01 DIAGNOSIS — C184 Malignant neoplasm of transverse colon: Secondary | ICD-10-CM | POA: Diagnosis not present

## 2020-11-01 DIAGNOSIS — C189 Malignant neoplasm of colon, unspecified: Secondary | ICD-10-CM | POA: Diagnosis not present

## 2020-11-01 MED ORDER — FLUDEOXYGLUCOSE F - 18 (FDG) INJECTION
7.2600 | Freq: Once | INTRAVENOUS | Status: AC | PRN
  Administered 2020-11-01: 7.26 via INTRAVENOUS

## 2020-11-03 ENCOUNTER — Encounter (HOSPITAL_COMMUNITY): Payer: Self-pay

## 2020-11-03 ENCOUNTER — Other Ambulatory Visit (HOSPITAL_COMMUNITY): Payer: Self-pay

## 2020-11-03 DIAGNOSIS — C787 Secondary malignant neoplasm of liver and intrahepatic bile duct: Secondary | ICD-10-CM

## 2020-11-03 DIAGNOSIS — C184 Malignant neoplasm of transverse colon: Secondary | ICD-10-CM

## 2020-11-03 NOTE — Progress Notes (Signed)
Order placed for US guided liver lesion biopsy per Dr. Delton Coombes

## 2020-11-04 ENCOUNTER — Encounter (HOSPITAL_COMMUNITY): Payer: Self-pay

## 2020-11-04 ENCOUNTER — Other Ambulatory Visit (HOSPITAL_COMMUNITY): Payer: Self-pay | Admitting: Hematology

## 2020-11-04 DIAGNOSIS — C189 Malignant neoplasm of colon, unspecified: Secondary | ICD-10-CM | POA: Insufficient documentation

## 2020-11-04 NOTE — Progress Notes (Unsigned)
    [name] Female, 75 y.o., [birth year]  MRN:  [id number] Phone:  [phone] Jerilynn Mages)       PCP:  Redmond School, MD Primary Cvg:  Medicare/Medicare Part A And B  Next Appt With Radiology ([facility]-CT 3) 11/09/2020 at 11:00 AM           RE: Biopsy Received: Today  Message Details  Arne Cleveland, MD  Ernestene Mention   CT biopsy enlarging liver lesion see PET CT Im [address]   Apparently not visible on [place] previously (though it was smaller) so I put this in CT where lesion is obvious on non-con   Previous biopsies were inconclusive x2   DDH    Previous Messages  ----- Message -----  From: Lenore Cordia  Sent: 11/02/2020  4:05 PM EDT  To: Arne Cleveland, MD, [facility] Procedure Requests  Subject: RE: Biopsy                    Sorry, it says final now   ----- Message -----  From: Arne Cleveland, MD  Sent: 11/02/2020  3:44 PM EDT  To: Lenore Cordia  Subject: RE: Biopsy                    Plz resubmit after PET is finalized   Thx  DDH     ----- Message -----  From: Lenore Cordia  Sent: 11/02/2020  3:07 PM EDT  To: Ir Procedure Requests  Subject: Biopsy                      Procedure Requested: CT Biopsy    Reason for Procedure: malignant colon cancer    Provider Requesting: Dr Delton Coombes  Provider Telephone: [phone]    Dr's additional notes: Liver Biopsy (to be confirmed by PET) - Please schedule following PET scan

## 2020-11-04 NOTE — Progress Notes (Signed)
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

## 2020-11-08 ENCOUNTER — Other Ambulatory Visit: Payer: Self-pay | Admitting: Radiology

## 2020-11-09 ENCOUNTER — Ambulatory Visit (HOSPITAL_COMMUNITY)
Admission: RE | Admit: 2020-11-09 | Discharge: 2020-11-09 | Disposition: A | Discharge status: Home / Self Care | Payer: Medicare Other | Source: Ambulatory Visit | Attending: Hematology | Admitting: Hematology

## 2020-11-09 ENCOUNTER — Encounter (HOSPITAL_COMMUNITY): Payer: Self-pay

## 2020-11-09 ENCOUNTER — Other Ambulatory Visit: Payer: Self-pay

## 2020-11-09 ENCOUNTER — Ambulatory Visit (HOSPITAL_COMMUNITY)
Admission: RE | Admit: 2020-11-09 | Discharge: 2020-11-09 | Disposition: A | Discharge status: Home / Self Care | Payer: Medicare Other | Source: Ambulatory Visit | Attending: Internal Medicine | Admitting: Internal Medicine

## 2020-11-09 ENCOUNTER — Other Ambulatory Visit (HOSPITAL_COMMUNITY): Payer: Self-pay | Admitting: Hematology

## 2020-11-09 ENCOUNTER — Other Ambulatory Visit (HOSPITAL_COMMUNITY): Payer: Self-pay

## 2020-11-09 ENCOUNTER — Other Ambulatory Visit (HOSPITAL_COMMUNITY): Payer: Self-pay | Admitting: Internal Medicine

## 2020-11-09 DIAGNOSIS — Z85038 Personal history of other malignant neoplasm of large intestine: Secondary | ICD-10-CM | POA: Diagnosis not present

## 2020-11-09 DIAGNOSIS — C229 Malignant neoplasm of liver, not specified as primary or secondary: Secondary | ICD-10-CM | POA: Diagnosis not present

## 2020-11-09 DIAGNOSIS — C787 Secondary malignant neoplasm of liver and intrahepatic bile duct: Secondary | ICD-10-CM

## 2020-11-09 DIAGNOSIS — R160 Hepatomegaly, not elsewhere classified: Secondary | ICD-10-CM

## 2020-11-09 DIAGNOSIS — C184 Malignant neoplasm of transverse colon: Secondary | ICD-10-CM

## 2020-11-09 DIAGNOSIS — C187 Malignant neoplasm of sigmoid colon: Secondary | ICD-10-CM | POA: Diagnosis not present

## 2020-11-09 DIAGNOSIS — Z87891 Personal history of nicotine dependence: Secondary | ICD-10-CM | POA: Diagnosis not present

## 2020-11-09 DIAGNOSIS — C189 Malignant neoplasm of colon, unspecified: Secondary | ICD-10-CM

## 2020-11-09 DIAGNOSIS — Z95828 Presence of other vascular implants and grafts: Secondary | ICD-10-CM

## 2020-11-09 DIAGNOSIS — Z7982 Long term (current) use of aspirin: Secondary | ICD-10-CM | POA: Diagnosis not present

## 2020-11-09 HISTORY — DX: Presence of other vascular implants and grafts: Z95.828

## 2020-11-09 LAB — PROTIME-INR
INR: 1 (ref 0.8–1.2)
Prothrombin Time: 13 seconds (ref 11.4–15.2)

## 2020-11-09 LAB — CBC
HCT: 41.2 % (ref 36.0–46.0)
Hemoglobin: 13 g/dL (ref 12.0–15.0)
MCH: 29 pg (ref 26.0–34.0)
MCHC: 31.6 g/dL (ref 30.0–36.0)
MCV: 91.8 fL (ref 80.0–100.0)
Platelets: 243 10*3/uL (ref 150–400)
RBC: 4.49 MIL/uL (ref 3.87–5.11)
RDW: 12.9 % (ref 11.5–15.5)
WBC: 8.6 10*3/uL (ref 4.0–10.5)
nRBC: 0 % (ref 0.0–0.2)

## 2020-11-09 MED ORDER — PROCHLORPERAZINE MALEATE 10 MG PO TABS: 10.0000 mg | ORAL_TABLET | Freq: Four times a day (QID) | ORAL | 1 refills | Status: DC | PRN

## 2020-11-09 MED ORDER — LIDOCAINE-PRILOCAINE 2.5-2.5 % EX CREA: TOPICAL_CREAM | CUTANEOUS | 3 refills | Status: DC

## 2020-11-09 MED ORDER — FENTANYL CITRATE (PF) 100 MCG/2ML IJ SOLN
INTRAMUSCULAR | Status: AC | PRN
  Administered 2020-11-09 (×4): 50 ug via INTRAVENOUS

## 2020-11-09 MED ORDER — SODIUM CHLORIDE 0.9 % IV SOLN: INTRAVENOUS | Status: DC

## 2020-11-09 MED ORDER — MIDAZOLAM HCL 2 MG/2ML IJ SOLN
INTRAMUSCULAR | Status: AC | PRN
  Administered 2020-11-09 (×5): 1 mg via INTRAVENOUS

## 2020-11-09 MED ORDER — MIDAZOLAM HCL 2 MG/2ML IJ SOLN
INTRAMUSCULAR | Status: AC
  Filled 2020-11-09: qty 4

## 2020-11-09 MED ORDER — LOPERAMIDE HCL 2 MG PO TABS: 2.0000 mg | ORAL_TABLET | ORAL | 1 refills | Status: DC | PRN

## 2020-11-09 MED ORDER — FENTANYL CITRATE (PF) 100 MCG/2ML IJ SOLN
INTRAMUSCULAR | Status: AC
  Filled 2020-11-09: qty 4

## 2020-11-09 MED ORDER — LIDOCAINE HCL (PF) 1 % IJ SOLN
INTRAMUSCULAR | Status: AC
  Filled 2020-11-09: qty 30

## 2020-11-09 MED ORDER — FENTANYL CITRATE (PF) 100 MCG/2ML IJ SOLN
INTRAMUSCULAR | Status: AC
  Filled 2020-11-09: qty 2

## 2020-11-09 MED ORDER — GELATIN ABSORBABLE 12-7 MM EX MISC
CUTANEOUS | Status: AC
  Filled 2020-11-09: qty 1

## 2020-11-09 NOTE — Progress Notes (Signed)
Up and walked to bathroom and tolerated well; right abd bandaids clean dry and intact

## 2020-11-09 NOTE — H&P (Signed)
Chief Complaint: Patient was seen in consultation today for liver lesion biopsy at the request of [place]  Referring Physician(s): Katragadda,Sreedhar  Supervising Physician: Jacqulynn Cadet  Patient Status: [facility] - Out-pt  History of Present Illness: Amanda Norris is a 74 y.o. female   Pt with original dx colorectal cancer 2015 Follows with Dr Delton Coombes In follow up imaging noted to have metastatic disease  09/28/20 mesenteric LN bx; LYMPH NODE, MIDLINE/MESENTERIC, BIOPSY:  - Mucin and fibrosis  10/14/20 SCLN bx;  LYMPH NODE, LEFT SUPRACLAVICULAR, BIOPSY:  - Soft tissue, mucin, and rare fragments of atypical columnar  epithelium, see comment. Lymphoid tissue is not  present. There are rare fragments of atypical columnar epithelium. Given  the patient's history and the location of the biopsy, the findings  likely represent metastatic colorectal adenocarcinoma. There is likely  insufficient material for additional studies.   PET scan 11/01/20: Interval worsening of disease since the prior study with enlarging lymph nodes in the chest and abdomen as described. Increasing number and size of pulmonary nodules. Areas that were largest on the prior study and showed low attenuation are little changed and show limited FDG uptake. Areas that have developed and or enlarged in the interval show moderate FDG uptake with most pronounced uptake in lymph nodes adjacent to the dominant mesenteric mass in the small bowel mesentery. Increased size of dominant hepatic lesion. Questionable area developing in the dome of the RIGHT hemi liver. Attention on follow-up.  Dr Delton Coombes asking for biopsy of enlarging PET + liver lesion Approved with IR Rad  Dr Raliegh Ip note 10/21/20: -We will require pathological confirmation for additional molecular testing.  Her previous sample was from 7 years ago which precludes as from doing next generation sequencing due to old sample. -Plasma based NGS  is not very reliable. -Hence I have recommended doing biopsy again.  I have recommended doing a PET CT scan to identify the area of biopsy. -It is possible that her tumor is producing a lot of mucin and causing fibrosis. -She has a port placed and is eager to proceed with treatment.  I have discussed the importance of biopsy for next generation sequencing which will open further treatment options. -We will also consider excision biopsy option based on the PET scan findings   Scheduled now for liver lesion bx  Past Medical History:  Diagnosis Date  . Arthritis    wrist and thumbs  . Colon cancer ([place])   . PONV (postoperative nausea and vomiting)   . Ruptured lumbar disc    L1-L5 per patient report  . Sciatica of left side     Past Surgical History:  Procedure Laterality Date  . COLONOSCOPY N/A 12/12/2013   Procedure: COLONOSCOPY;  Surgeon: Rogene Houston, MD;  Location: [facility];  Service: Endoscopy;  Laterality: N/A;  730  . COLONOSCOPY N/A 01/20/2015   Procedure: COLONOSCOPY;  Surgeon: Rogene Houston, MD;  Location: [facility];  Service: Endoscopy;  Laterality: N/A;  1030  . COLONOSCOPY N/A 03/28/2018   Procedure: COLONOSCOPY;  Surgeon: Rogene Houston, MD;  Location: [facility];  Service: Endoscopy;  Laterality: N/A;  1015  . EUS N/A 12/18/2013   Procedure: UPPER ENDOSCOPIC ULTRASOUND (EUS) LINEAR;  Surgeon: Milus Banister, MD;  Location: [facility];  Service: Endoscopy;  Laterality: N/A;  . herniated disc     [id number]  . IR IMAGING GUIDED PORT INSERTION  10/14/2020  . IR US GUIDANCE  10/14/2020  . PARTIAL COLECTOMY  N/A 12/29/2013   Procedure: RIGHT HEMICOLECTOMY;  Surgeon: Dalia Heading, MD;  Location: [facility] ORS;  Service: General;  Laterality: N/A;  . POLYPECTOMY  03/28/2018   Procedure: POLYPECTOMY;  Surgeon: Malissa Hippo, MD;  Location: [facility];  Service: Endoscopy;;  transverse colon (hot snare);  Marland Kitchen PORT-A-CATH REMOVAL Right 04/01/2014   Procedure: MINOR  REMOVAL PORT-A-CATH;  Surgeon: Dalia Heading, MD;  Location: [facility];  Service: General;  Laterality: Right;  . PORTACATH PLACEMENT Right 02/04/2014   Procedure: INSERTION PORT-A-CATH;  Surgeon: Dalia Heading, MD;  Location: [facility];  Service: General;  Laterality: Right;    Allergies: Sulfa antibiotics  Medications: Prior to Admission medications   Medication Sig Start Date End Date Taking? Authorizing Provider  acetaminophen (TYLENOL) 500 MG tablet Take 500 mg by mouth every 6 (six) hours as needed for moderate pain or headache.   Yes [provider]  aspirin 81 MG EC tablet Take 1 tablet (81 mg total) by mouth daily. Swallow whole. 04/04/18  Yes Rehman, Joline Maxcy, MD  Bioflavonoid Products (ESTER C PO) Take 1,000 mg by mouth daily.   Yes [provider]  Cholecalciferol (D3-1000) 25 MCG (1000 UT) capsule Take 125 Units by mouth daily.   Yes [provider]  Cranberry-Vitamin C-Vitamin E 4200-20-3 MG-MG-UNIT CAPS Take 500 mg by mouth daily.   Yes [provider]  Garlic 1000 MG CAPS Take 1,000 mg by mouth daily.    Yes [provider]  loperamide (IMODIUM A-D) 2 MG tablet Take 2 mg by mouth 4 (four) times daily as needed for diarrhea or loose stools.   Yes [provider]  Multiple Vitamin (MULTIVITAMIN) tablet Take 1 tablet by mouth daily.   Yes [provider]  Omega-3 Fatty Acids (FISH OIL) 1200 MG CAPS Take 1,000 mg by mouth daily.   Yes [provider]     Family History  Problem Relation Age of Onset  . Diabetes type II Mother   . Dementia Father     Social History   Socioeconomic History  . Marital status: Married    Spouse name: Not on file  . Number of children: Not on file  . Years of education: Not on file  . Highest education level: Not on file  Occupational History  . Not on file  Tobacco Use  . Smoking status: Former Smoker    Packs/day: 0.25    Years: 5.00    Pack years: 1.25    Types:  Cigarettes    Quit date: 04/19/1959    Years since quitting: 61.6  . Smokeless tobacco: Never Used  . Tobacco comment: smoked for 5 years as teenager  Vaping Use  . Vaping Use: Never used  Substance and Sexual Activity  . Alcohol use: No  . Drug use: No  . Sexual activity: Yes    Birth control/protection: Post-menopausal  Other Topics Concern  . Not on file  Social History Narrative  . Not on file   Social Determinants of Health   Financial Resource Strain: Low Risk   . Difficulty of Paying Living Expenses: Not hard at all  Food Insecurity: No Food Insecurity  . Worried About Programme researcher, broadcasting/film/video in the Last Year: Never true  . Ran Out of Food in the Last Year: Never true  Transportation Needs: No Transportation Needs  . Lack of Transportation (Medical): No  . Lack of Transportation (Non-Medical): No  Physical Activity: Inactive  . Days of  Exercise per Week: 0 days  . Minutes of Exercise per Session: 0 min  Stress: Stress Concern Present  . Feeling of Stress : To some extent  Social Connections: Socially Integrated  . Frequency of Communication with Friends and Family: More than three times a week  . Frequency of Social Gatherings with Friends and Family: More than three times a week  . Attends Religious Services: More than 4 times per year  . Active Member of Clubs or Organizations: Yes  . Attends Archivist Meetings: More than 4 times per year  . Marital Status: Married    Review of Systems: A 12 point ROS discussed and pertinent positives are indicated in the HPI above.  All other systems are negative.  Review of Systems  Constitutional: Positive for activity change and appetite change.  Respiratory: Negative for cough.   Cardiovascular: Negative for chest pain.  Gastrointestinal: Positive for abdominal pain.  Psychiatric/Behavioral: Negative for behavioral problems and confusion.    Vital Signs: BP (!) 161/77   Pulse 92   Temp 97.9 F (36.6 C)  (Oral)   Resp 20   Ht 5\' 4"  (1.626 m)   Wt 155 lb (70.3 kg)   SpO2 100%   BMI 26.61 kg/m   Physical Exam Vitals reviewed.  HENT:     Mouth/Throat:     Mouth: Mucous membranes are moist.  Cardiovascular:     Rate and Rhythm: Normal rate and regular rhythm.     Heart sounds: Normal heart sounds.  Pulmonary:     Effort: Pulmonary effort is normal.     Breath sounds: Normal breath sounds.  Abdominal:     Palpations: Abdomen is soft.     Tenderness: There is no abdominal tenderness.  Musculoskeletal:        General: Normal range of motion.  Skin:    General: Skin is warm.  Neurological:     Mental Status: She is alert and oriented to person, place, and time.  Psychiatric:        Behavior: Behavior normal.     Imaging: NM PET Image Restag (PS) Skull Base To Thigh  Result Date: 11/02/2020 CLINICAL DATA:  Subsequent treatment strategy for colon cancer with metastatic disease. EXAM: NUCLEAR MEDICINE PET SKULL BASE TO THIGH TECHNIQUE: 7.26 mCi F-18 FDG was injected intravenously. Full-ring PET imaging was performed from the skull base to thigh after the radiotracer. CT data was obtained and used for attenuation correction and anatomic localization. Fasting blood glucose: 99 mg/dl COMPARISON:  Multiple prior studies, most recent exam from March of 2022. FINDINGS: Mediastinal blood pool activity: SUV max 2.8 Liver activity: SUV max NA NECK: LEFT thoracic inlet lymph node with enlargement without significant corresponding FDG uptake. Maximum SUV of 3.1. Node measuring 2.2 cm short axis similar to previous imaging from September 27, 2020 No additional signs of nodal enlargement in the neck. Incidental CT findings: none CHEST: Enlarged lymph nodes in the chest, further enlargement of lymph nodes along the RIGHT paratracheal chain since the previous study. 95/3 1.8 cm RIGHT paratracheal lymph node previously less than a cm with a maximum SUV of 3.4. 82/3 a 1 cm lymph node in the superior mediastinum  between the LEFT carotid and brachiocephalic origins with a maximum SUV of 5.4 Precarinal lymph node on image 99/3 approximately 1 cm, previously less than a cm with a maximum SUV of 3.9 Activity in the RIGHT hilum with suspected lymph node in this location, similar FDG uptake to mediastinal lymph  nodes. Bulky retrocrural lymph node 2.5 cm with moderate FDG uptake approximately 4.04 maximum SUV. Similar uptake tracking into upper abdominal lymph nodes. Numerous bilateral pulmonary nodules which have enlarged since the previous study. Also multiple new pulmonary nodules are evident most less than a cm. Is Other nodules that were present previously show similar enlargement. RIGHT upper lobe pulmonary nodule (image 77, series 3) 11 mm, previously 8 mm. Image 110/3 1.3 cm RIGHT lower lobe pulmonary nodule with a maximum SUV of 1.6. This previously measured approximately 0.7 cm. Incidental CT findings: RIGHT-sided Port-A-Cath in place. Heart size stable. No substantial pericardial effusion. Aorta is normal caliber. Central pulmonary vessels normal caliber. Limited assessment of cardiovascular structures given lack of intravenous contrast. ABDOMEN/PELVIS: Bulky mesenteric mass (image 194, series 3) 5.0 x 4.1 cm. The dominant portion of the mass, central portion shows limited FDG uptake. Associated mesenteric lymph nodes on image 206 of series 3 with intense metabolic activity maximum SUV in this area of approximately 9.3. Lymph nodes have enlarged within the small bowel mesentery since the previous study. For example on image 209 of series 3 a 2 mm lymph node previously measured approximately 7 mm. Other lymph nodes in this area have enlarged to a similar extent compared to recent imaging. Retroperitoneal adenopathy with similar size 2.1 cm lymph node on 186/3. LEFT periaortic lymph node on 186/3 2 cm. These are unchanged and with moderate FDG uptake Focal hepatic lesion increased in size 2.6 x 2.1 as compared to 1.4 x  0.9 cm with maximum SUV of 4.7. Subtle area of hypodensity in the dome of the RIGHT hemi liver on image 145 of series 3 suggested but not confirmed. Mildly heterogeneous uptake throughout the liver without additional discrete focus of increased metabolic activity Incidental CT findings: No acute findings related to liver, gallbladder, spleen, pancreas and adrenal glands. Smooth contour the kidneys without hydronephrosis or perinephric stranding. Postoperative changes related to partial colectomy. No ascites. Uterus and adnexal structures grossly normal. SKELETON: No focal hypermetabolic activity to suggest skeletal metastasis. Incidental CT findings: none IMPRESSION: Interval worsening of disease since the prior study with enlarging lymph nodes in the chest and abdomen as described. Increasing number and size of pulmonary nodules. Areas that were largest on the prior study and showed low attenuation are little changed and show limited FDG uptake. Areas that have developed and or enlarged in the interval show moderate FDG uptake with most pronounced uptake in lymph nodes adjacent to the dominant mesenteric mass in the small bowel mesentery. Increased size of dominant hepatic lesion. Questionable area developing in the dome of the RIGHT hemi liver. Attention on follow-up. Electronically Signed   By: Zetta Bills M.D.   On: 11/02/2020 12:19   IR US Guidance  Result Date: 10/14/2020 INDICATION: 75 year old with history of colon cancer. Recent imaging is suggestive for metastatic disease and patient had an inconclusive mesenteric lymph node biopsy. Patient also has a suspicious left supraclavicular lymph node. EXAM: ULTRASOUND-GUIDED CORE BIOPSY OF LEFT SUPRACLAVICULAR LYMPH NODE MEDICATIONS: Moderate sedation ANESTHESIA/SEDATION: Moderate (conscious) sedation was employed during this procedure. A total of Versed 4.0 mg and Fentanyl 100 mcg was administered intravenously. Moderate Sedation Time: 21 minutes. The  patient's level of consciousness and vital signs were monitored continuously by radiology nursing throughout the procedure under my direct supervision. FLUOROSCOPY TIME:  None COMPLICATIONS: None immediate. PROCEDURE: Informed written consent was obtained from the patient after a thorough discussion of the procedural risks, benefits and alternatives. All questions were addressed. A  timeout was performed prior to the initiation of the procedure. Left side of the neck was evaluated with ultrasound. An enlarged left supraclavicular lymph node was identified. The skin was prepped with chlorhexidine and sterile field was created. Maximal barrier sterile technique was utilized including caps, mask, sterile gowns, sterile gloves, sterile drape, hand hygiene and skin antiseptic. Skin was anesthetized using 1% lidocaine. Small incision was made. Using ultrasound guidance, an 18 gauge core device was directed into the left supraclavicular lymph node. Multiple core biopsies were obtained. Specimens placed in saline. Bandage placed over the puncture site. FINDINGS: Heterogeneous hypoechoic lymph node lateral to the left internal jugular vein. Findings correspond with the previous CT findings. Echotexture is similar to the mesenteric lesion that was previously biopsied. IMPRESSION: Ultrasound-guided core biopsies of the abnormal left supraclavicular lymph node. Electronically Signed   By: Markus Daft M.D.   On: 10/14/2020 17:00   IR IMAGING GUIDED PORT INSERTION  Result Date: 10/14/2020 INDICATION: 75 year old with history of colon cancer and evidence for metastatic disease on recent imaging. EXAM: FLUOROSCOPIC AND ULTRASOUND GUIDED PLACEMENT OF A SUBCUTANEOUS PORT COMPARISON:  None. MEDICATIONS: Moderate sedation ANESTHESIA/SEDATION: Fentanyl 100 mcg. Patient was given fentanyl and Versed immediately prior to this procedure for the left neck lymph node biopsy. Moderate Sedation Time:  27 minutes The patient was continuously  monitored during the procedure by the interventional radiology nurse under my direct supervision. FLUOROSCOPY TIME:  24 seconds, 2 mGy COMPLICATIONS: None immediate. PROCEDURE: The procedure, risks, benefits, and alternatives were explained to the patient. Questions regarding the procedure were encouraged and answered. The patient understands and consents to the procedure. Patient was placed supine on the interventional table. Ultrasound confirmed a patent right internal jugular vein. Ultrasound image was saved for documentation. The right chest and neck were cleaned with a skin antiseptic and a sterile drape was placed. Maximal barrier sterile technique was utilized including caps, mask, sterile gowns, sterile gloves, sterile drape, hand hygiene and skin antiseptic. The right neck was anesthetized with 1% lidocaine. Small incision was made in the right neck with a blade. Micropuncture set was placed in the right internal jugular vein with ultrasound guidance. The micropuncture wire was used for measurement purposes. The right chest was anesthetized with 1% lidocaine with epinephrine. #15 blade was used to make an incision and a subcutaneous port pocket was formed. [place] was assembled. Subcutaneous tunnel was formed with a stiff tunneling device. The port catheter was brought through the subcutaneous tunnel. The port was placed in the subcutaneous pocket and sutured in place. The micropuncture set was exchanged for a peel-away sheath. The catheter was placed through the peel-away sheath and the tip was positioned at the superior cavoatrial junction. Catheter placement was confirmed with fluoroscopy. The port was accessed and flushed with heparinized saline. The port pocket was closed using two layers of absorbable sutures and Dermabond. The vein skin site was closed using a single layer of absorbable suture and Dermabond. Sterile dressings were applied. Patient tolerated the procedure well without an  immediate complication. Ultrasound and fluoroscopic images were taken and saved for this procedure. IMPRESSION: Placement of a subcutaneous port device. Catheter tip at the superior cavoatrial junction. Electronically Signed   By: Markus Daft M.D.   On: 10/14/2020 17:03    Labs:  CBC: Recent Labs    08/26/20 0944 09/17/20 1018 09/28/20 1112 11/09/20 0852  WBC 7.5 7.5 7.6 8.6  HGB 13.8 12.9 13.7 13.0  HCT 41.5 40.6 42.3 41.2  PLT 286 232 236 243    COAGS: Recent Labs    09/28/20 1112 11/09/20 0852  INR 1.0 1.0    BMP: Recent Labs    08/26/20 0944 09/17/20 1018 09/28/20 1112  NA 139 137 141  K 4.1 4.0 4.0  CL 104 103 105  CO2 27 26 25   GLUCOSE 98 98 109*  BUN 12 14 10   CALCIUM 9.6 9.6 9.6  CREATININE 0.68 0.68 0.52  GFRNONAA  --  >60 >60    LIVER FUNCTION TESTS: Recent Labs    08/26/20 0944 09/17/20 1018 09/28/20 1112  BILITOT 0.3 0.3 0.8  AST 19 21 22   ALT 13 14 16   ALKPHOS  --  66 76  PROT 7.6 7.4 8.2*  ALBUMIN  --  3.9 4.2    TUMOR MARKERS: Recent Labs    08/26/20 0944  CEA 7.7*    Assessment and Plan:  Hx colorectal Cancer 2015 Follows with Dr Delton Coombes Imaging revealing metastasis Has had Mesenteric LN bx and SCLN bx-- both insufficient Now scheduled for +PET enlarging liver lesion biopsy to determine next steps per Oncology Risks and benefits of liver lesion bx was discussed with the patient and/or patient's family including, but not limited to bleeding, infection, damage to adjacent structures or low yield requiring additional tests.  All of the questions were answered and there is agreement to proceed. Consent signed and in chart.  Thank you for this interesting consult.  I greatly enjoyed meeting KEXIN ZELENAK and look forward to participating in their care.  A copy of this report was sent to the requesting provider on this date.  Electronically Signed: Lavonia Drafts, PA-C 11/09/2020, 10:13 AM   I spent a total of    25 Minutes  in face to face in clinical consultation, greater than 50% of which was counseling/coordinating care for liver lesion bx

## 2020-11-09 NOTE — Procedures (Signed)
Interventional Radiology Procedure Note  Procedure: CT guided core biopsy of liver lesion  Complications: None  Estimated Blood Loss: None  Recommendations: - Bedrest x 2 hrs - DC home    Signed,  Criselda Peaches, MD

## 2020-11-09 NOTE — Sedation Documentation (Signed)
Pt taken to CT for bx.

## 2020-11-09 NOTE — Discharge Instructions (Signed)
Liver Biopsy, Care After These instructions give you information on caring for yourself after your procedure. Your doctor may also give you more specific instructions. Call your doctor if you have any problems or questions after your procedure. What can I expect after the procedure? After the procedure, it is common to have:  Pain and soreness where the biopsy was done.  Bruising around the area where the biopsy was done.  Sleepiness and be tired for a few days. Follow these instructions at home: Medicines  Take over-the-counter and prescription medicines only as told by your doctor.  If you were prescribed an antibiotic medicine, take it as told by your doctor. Do not stop taking the antibiotic even if you start to feel better.  Do not take medicines such as aspirin and ibuprofen. These medicines can thin your blood. Do not take these medicines unless your doctor tells you to take them.  If you are taking prescription pain medicine, take actions to prevent or treat constipation. Your doctor may recommend that you: ? Drink enough fluid to keep your pee (urine) clear or pale yellow. ? Take over-the-counter or prescription medicines. ? Eat foods that are high in fiber, such as fresh fruits and vegetables, whole grains, and beans. ? Limit foods that are high in fat and processed sugars, such as fried and sweet foods. Caring for your cut  Follow instructions from your doctor about how to take care of your cuts from surgery (incisions). Make sure you: ? Wash your hands with soap and water before you change your bandage (dressing). If you cannot use soap and water, use hand sanitizer. ? Change your bandage as told by your doctor. ? Leave stitches (sutures), skin glue, or skin tape (adhesive) strips in place. They may need to stay in place for 2 weeks or longer. If tape strips get loose and curl up, you may trim the loose edges. Do not remove tape strips completely unless your doctor says it is  okay.  Check your cuts every day for signs of infection. Check for: ? Redness, swelling, or more pain. ? Fluid or blood. ? Pus or a bad smell. ? Warmth.  Do not take baths, swim, or use a hot tub until your doctor says it is okay to do so. Activity  Rest at home for 1-2 days or as told by your doctor. ? Avoid sitting for a long time without moving. Get up to take short walks every 1-2 hours.  Return to your normal activities as told by your doctor. Ask what activities are safe for you.  Do not do these things in the first 24 hours: ? Drive. ? Use machinery. ? Take a bath or shower.  Do not lift more than 10 pounds (4.5 kg) or play contact sports for the first 2 weeks.   General instructions  Do not drink alcohol in the first week after the procedure.  Have someone stay with you for at least 24 hours after the procedure.  Get your test results. Ask your doctor or the department that is doing the test: ? When will my results be ready? ? How will I get my results? ? What are my treatment options? ? What other tests do I need? ? What are my next steps?  Keep all follow-up visits as told by your doctor. This is important.   Contact a doctor if:  A cut bleeds and leaves more than just a small spot of blood.  A cut is red,   puffs up (swells), or hurts more than before.  Fluid or something else comes from a cut.  A cut smells bad.  You have a fever or chills. Get help right away if:  You have swelling, bloating, or pain in your belly (abdomen).  You get dizzy or faint.  You have a rash.  You feel sick to your stomach (nauseous) or throw up (vomit).  You have trouble breathing, feel short of breath, or feel faint.  Your chest hurts.  You have problems talking or seeing.  You have trouble with your balance or moving your arms or legs. Summary  After the procedure, it is common to have pain, soreness, bruising, and tiredness.  Your doctor will tell you how to  take care of yourself at home. Change your bandage, take your medicines, and limit your activities as told by your doctor.  Call your doctor if you have symptoms of infection. Get help right away if your belly swells, your cut bleeds a lot, or you have trouble talking or breathing. This information is not intended to replace advice given to you by your health care provider. Make sure you discuss any questions you have with your health care provider. Document Revised: 07/05/2017 Document Reviewed: 07/06/2017 Elsevier Patient Education  2021 [employer]  

## 2020-11-10 ENCOUNTER — Inpatient Hospital Stay (HOSPITAL_COMMUNITY): Payer: Medicare Other

## 2020-11-10 ENCOUNTER — Other Ambulatory Visit (HOSPITAL_COMMUNITY): Payer: Self-pay | Admitting: Hematology

## 2020-11-10 ENCOUNTER — Other Ambulatory Visit: Payer: Self-pay

## 2020-11-10 ENCOUNTER — Inpatient Hospital Stay (HOSPITAL_BASED_OUTPATIENT_CLINIC_OR_DEPARTMENT_OTHER): Payer: Medicare Other | Admitting: Hematology

## 2020-11-10 VITALS — BP 143/61 | HR 62 | Temp 97.3°F | Resp 18

## 2020-11-10 VITALS — BP 149/72 | HR 74 | Temp 97.3°F | Resp 20 | Wt 153.0 lb

## 2020-11-10 DIAGNOSIS — Z79899 Other long term (current) drug therapy: Secondary | ICD-10-CM | POA: Insufficient documentation

## 2020-11-10 DIAGNOSIS — Z9049 Acquired absence of other specified parts of digestive tract: Secondary | ICD-10-CM | POA: Diagnosis not present

## 2020-11-10 DIAGNOSIS — Z5112 Encounter for antineoplastic immunotherapy: Secondary | ICD-10-CM | POA: Diagnosis not present

## 2020-11-10 DIAGNOSIS — C787 Secondary malignant neoplasm of liver and intrahepatic bile duct: Secondary | ICD-10-CM

## 2020-11-10 DIAGNOSIS — C189 Malignant neoplasm of colon, unspecified: Secondary | ICD-10-CM

## 2020-11-10 DIAGNOSIS — C184 Malignant neoplasm of transverse colon: Secondary | ICD-10-CM | POA: Insufficient documentation

## 2020-11-10 DIAGNOSIS — Z5111 Encounter for antineoplastic chemotherapy: Secondary | ICD-10-CM | POA: Insufficient documentation

## 2020-11-10 DIAGNOSIS — C770 Secondary and unspecified malignant neoplasm of lymph nodes of head, face and neck: Secondary | ICD-10-CM | POA: Insufficient documentation

## 2020-11-10 DIAGNOSIS — C7801 Secondary malignant neoplasm of right lung: Secondary | ICD-10-CM | POA: Insufficient documentation

## 2020-11-10 DIAGNOSIS — C7802 Secondary malignant neoplasm of left lung: Secondary | ICD-10-CM | POA: Insufficient documentation

## 2020-11-10 DIAGNOSIS — Z87891 Personal history of nicotine dependence: Secondary | ICD-10-CM | POA: Diagnosis not present

## 2020-11-10 DIAGNOSIS — Z95828 Presence of other vascular implants and grafts: Secondary | ICD-10-CM

## 2020-11-10 DIAGNOSIS — R109 Unspecified abdominal pain: Secondary | ICD-10-CM | POA: Diagnosis not present

## 2020-11-10 DIAGNOSIS — R197 Diarrhea, unspecified: Secondary | ICD-10-CM | POA: Diagnosis not present

## 2020-11-10 LAB — COMPREHENSIVE METABOLIC PANEL
ALT: 16 U/L (ref 0–44)
AST: 24 U/L (ref 15–41)
Albumin: 4 g/dL (ref 3.5–5.0)
Alkaline Phosphatase: 70 U/L (ref 38–126)
Anion gap: 8 (ref 5–15)
BUN: 11 mg/dL (ref 8–23)
CO2: 27 mmol/L (ref 22–32)
Calcium: 9.6 mg/dL (ref 8.9–10.3)
Chloride: 101 mmol/L (ref 98–111)
Creatinine, Ser: 0.72 mg/dL (ref 0.44–1.00)
GFR, Estimated: >60 mL/min (ref >60)
Glucose, Bld: 154 mg/dL — ABNORMAL HIGH (ref 70–99)
Potassium: 3.6 mmol/L (ref 3.5–5.1)
Sodium: 136 mmol/L (ref 135–145)
Total Bilirubin: 0.6 mg/dL (ref 0.3–1.2)
Total Protein: 7.9 g/dL (ref 6.5–8.1)

## 2020-11-10 LAB — CBC WITH DIFFERENTIAL/PLATELET
Abs Immature Granulocytes: 0.02 10*3/uL (ref 0.00–0.07)
Basophils Absolute: 0 10*3/uL (ref 0.0–0.1)
Basophils Relative: 1 %
Eosinophils Absolute: 0.1 10*3/uL (ref 0.0–0.5)
Eosinophils Relative: 1 %
HCT: 41.4 % (ref 36.0–46.0)
Hemoglobin: 13.2 g/dL (ref 12.0–15.0)
Immature Granulocytes: 0 %
Lymphocytes Relative: 16 %
Lymphs Abs: 1 10*3/uL (ref 0.7–4.0)
MCH: 29.8 pg (ref 26.0–34.0)
MCHC: 31.9 g/dL (ref 30.0–36.0)
MCV: 93.5 fL (ref 80.0–100.0)
Monocytes Absolute: 0.6 10*3/uL (ref 0.1–1.0)
Monocytes Relative: 9 %
Neutro Abs: 4.9 10*3/uL (ref 1.7–7.7)
Neutrophils Relative %: 73 %
Platelets: 226 10*3/uL (ref 150–400)
RBC: 4.43 MIL/uL (ref 3.87–5.11)
RDW: 13 % (ref 11.5–15.5)
WBC: 6.7 10*3/uL (ref 4.0–10.5)
nRBC: 0 % (ref 0.0–0.2)

## 2020-11-10 LAB — URINALYSIS, DIPSTICK ONLY
Bilirubin Urine: NEGATIVE
Glucose, UA: NEGATIVE mg/dL
Ketones, ur: NEGATIVE mg/dL
Leukocytes,Ua: NEGATIVE
Nitrite: NEGATIVE
Protein, ur: NEGATIVE mg/dL
Specific Gravity, Urine: 1.004 — ABNORMAL LOW (ref 1.005–1.030)
pH: 7 (ref 5.0–8.0)

## 2020-11-10 MED ORDER — ATROPINE SULFATE 1 MG/ML IJ SOLN
0.5000 mg | Freq: Once | INTRAMUSCULAR | Status: AC
Start: 2020-11-10 — End: 2020-11-10
  Administered 2020-11-10: 0.5 mg via INTRAVENOUS
  Filled 2020-11-10: qty 1

## 2020-11-10 MED ORDER — FLUOROURACIL CHEMO INJECTION 500 MG/10ML
240.0000 mg/m2 | Freq: Once | INTRAVENOUS | Status: AC
  Administered 2020-11-10: 450 mg via INTRAVENOUS
  Filled 2020-11-10: qty 9

## 2020-11-10 MED ORDER — SODIUM CHLORIDE 0.9 % IV SOLN
108.0000 mg/m2 | Freq: Once | INTRAVENOUS | Status: AC
  Administered 2020-11-10: 200 mg via INTRAVENOUS
  Filled 2020-11-10: qty 10

## 2020-11-10 MED ORDER — PALONOSETRON HCL INJECTION 0.25 MG/5ML
0.2500 mg | Freq: Once | INTRAVENOUS | Status: AC
  Administered 2020-11-10: 0.25 mg via INTRAVENOUS
  Filled 2020-11-10: qty 5

## 2020-11-10 MED ORDER — SODIUM CHLORIDE 0.9 % IV SOLN
1440.0000 mg/m2 | INTRAVENOUS | Status: DC
  Administered 2020-11-10: 2550 mg via INTRAVENOUS
  Filled 2020-11-10: qty 51

## 2020-11-10 MED ORDER — SODIUM CHLORIDE 0.9 % IV SOLN
Freq: Once | INTRAVENOUS | Status: AC
Start: 2020-11-10 — End: 2020-11-10

## 2020-11-10 MED ORDER — SODIUM CHLORIDE 0.9 % IV SOLN
240.0000 mg/m2 | Freq: Once | INTRAVENOUS | Status: AC
  Administered 2020-11-10: 428 mg via INTRAVENOUS
  Filled 2020-11-10: qty 21.4

## 2020-11-10 MED ORDER — SODIUM CHLORIDE 0.9 % IV SOLN: Freq: Once | INTRAVENOUS | Status: AC

## 2020-11-10 MED ORDER — SODIUM CHLORIDE 0.9 % IV SOLN
10.0000 mg | Freq: Once | INTRAVENOUS | Status: AC
  Administered 2020-11-10: 10 mg via INTRAVENOUS
  Filled 2020-11-10: qty 10

## 2020-11-10 NOTE — Patient Instructions (Signed)
[facility]  Discharge Instructions: Thank you for choosing [facility] to provide your oncology and hematology care.  If you have a lab appointment with the [facility], please come in thru the Main Entrance and check in at the main information desk.  Wear comfortable clothing and clothing appropriate for easy access to any Portacath or PICC line.   We strive to give you quality time with your provider. You may need to reschedule your appointment if you arrive late (15 or more minutes).  Arriving late affects you and other patients whose appointments are after yours.  Also, if you miss three or more appointments without notifying the office, you may be dismissed from the clinic at the provider's discretion.      For prescription refill requests, have your pharmacy contact our office and allow 72 hours for refills to be completed.    Today you received the following chemotherapy and/or immunotherapy agents F[facility],    To help prevent nausea and vomiting after your treatment, we encourage you to take your nausea medication as directed.  BELOW ARE SYMPTOMS THAT SHOULD BE REPORTED IMMEDIATELY: . *FEVER GREATER THAN 100.4 F (38 C) OR HIGHER . *CHILLS OR SWEATING . *NAUSEA AND VOMITING THAT IS NOT CONTROLLED WITH YOUR NAUSEA MEDICATION . *UNUSUAL SHORTNESS OF BREATH . *UNUSUAL BRUISING OR BLEEDING . *URINARY PROBLEMS (pain or burning when urinating, or frequent urination) . *BOWEL PROBLEMS (unusual diarrhea, constipation, pain near the anus) . TENDERNESS IN MOUTH AND THROAT WITH OR WITHOUT PRESENCE OF ULCERS (sore throat, sores in mouth, or a toothache) . UNUSUAL RASH, SWELLING OR PAIN  . UNUSUAL VAGINAL DISCHARGE OR ITCHING   Items with * indicate a potential emergency and should be followed up as soon as possible or go to the Emergency Department if any problems should occur.  Please show the CHEMOTHERAPY ALERT CARD or IMMUNOTHERAPY ALERT CARD at check-in to the  Emergency Department and triage nurse.  Should you have questions after your visit or need to cancel or reschedule your appointment, please contact [facility] [phone]  and follow the prompts.  Office hours are 8:00 a.m. to 4:30 p.m. Monday - Friday. Please note that voicemails left after 4:00 p.m. may not be returned until the following business day.  We are closed weekends and major holidays. You have access to a nurse at all times for urgent questions. Please call the main number to the clinic [phone] and follow the prompts.  For any non-urgent questions, you may also contact your provider using MyChart. We now offer e-Visits for anyone 48 and older to request care online for non-urgent symptoms. For details visit [email or web address].   Also download the MyChart app! Go to the app store, search "MyChart", open the app, select [facility], and log in with your MyChart username and password.  Due to Covid, a mask is required upon entering the hospital/clinic. If you do not have a mask, one will be given to you upon arrival. For doctor visits, patients may have 1 support person aged 75 or older with them. For treatment visits, patients cannot have anyone with them due to current Covid guidelines and our immunocompromised population.

## 2020-11-10 NOTE — Progress Notes (Signed)
Patient presents today for treatment and follow up visit with Dr. Delton Coombes. Labs within parameters for treatment today. MAR reviewed and updated. Patient has complaints of diarrhea which she states she is using Imodium for per Dr. Tomie China instructions. Patient denies any pain or nausea today. Consents obtained and copy given to patient at the bedside for today's treatment. Vital signs within parameters for treatment. Port a cath placed on 10-14-20 and verified proper placement via xray report. Catheter tip @ superior cavoatrial junction .   Message received from Dr. Burna Cash RN. Proceed with treatment today. Dose to be given 60% per instant message Dr. Burna Cash RN.   No ZIRABEV today per [facility] R[facility]/ Dr. Delton Coombes. Per CJones RPH Zirabev held today and anticipated to start on Cycle 2.   Treatment given today per MD orders. Tolerated infusion without adverse affects. Vital signs stable. No complaints at this time. RUN noted on 5FU screen and verified with patient. Spill kit, pump checklist, and teaching performed. Understanding verbalized.  Discharged from clinic ambulatory in stable condition. Alert and oriented x 3. F/U with [facility] as scheduled.

## 2020-11-10 NOTE — Progress Notes (Signed)
Patient has been assessed by Dr. Delton Coombes and lab results reviewed.  Patient will receive 60% of dose for this treatment.  Ok to proceed with treatment today.

## 2020-11-10 NOTE — Progress Notes (Signed)
[place], [place] [address]   CLINIC:  Medical Oncology/Hematology  PCP:  [facility], [address] / [place] [address] [phone]   REASON FOR VISIT:  Follow-up for transverse colon adenocarcinoma  PRIOR THERAPY:  1. Right hemicolectomy on 12/29/2013. 2. FOLFOX x 3 cycles from 02/10/2014 to 03/10/2014, stopped secondary to poor tolerance.  NGS Results: Not done  CURRENT THERAPY: FOLFIRI, Avastin and Aloxi every 2 weeks  BRIEF ONCOLOGIC HISTORY:  Oncology History  Adenocarcinoma of transverse colon ([facility]) (Resolved)  12/09/2013 Initial Diagnosis   1. Colon, biopsy, proximal transverse mass INVASIVE ADENOCARCINOMA.   12/29/2013 Definitive Surgery   Colon, segmental resection for tumor, right - INVASIVE COLORECTAL ADENOCARCINOMA, 7 CM, EXTENDING INTO PERICOLONIC CONNECTIVE TISSUE. - METASTATIC CARCINOMA IN 6 OF 33 LYMPH NODES (6/33).   02/10/2014 - 03/10/2014 Adjuvant Chemotherapy   FOLFOX x 3 cycles   03/11/2014 Adverse Reaction   Patient called reporting diarrhea despite dose reduction and she wants to stop therapy.  Patient seen on 03/24/14 to discuss other treatment options and she stands by her decision to stop all therapy.   04/01/2014 Surgery   Port-a-cath removal by Dr. Arnoldo Morale.   01/12/2015 PET scan   Mild abdominal mesenteric LAD show hypermetabolic activity, 7 mm indeterminate pulm nodule in sup segment of RLL shows no assoc metabolic activity   [id number] PET scan   Mild progression of nodal mets in anterior mid abdominal mesentery, new nodal mets at L thoracic inlet. stable 6 mm nodule in posterior RLL, unchanged since 2015   06/22/2015 PET scan   Resolution of metabolic activity and decreased size of mild LAD at L thoracic inlet, stable mild mid abd hypermetabolic mesenteric LAD, stable 6 mm posterior LLL pulm nodule without metabolic activity   [id number] Imaging   MRI lumbar spine L4-L5 disc degenerated,  broad based disc hernation, B facet arthropathy with hypertophy and edema, stenosis of lateral recesses that could cause neural compression   10/18/2016 Imaging   CT CAP- 1. Several small ground-glass pulmonary nodules are present in the lungs and are stable over the past 9 months, but several are mildly larger than they were in 2015. Low-grade adenocarcinoma can sometimes have this appearance and surveillance is likely warranted. 2. There is a cluster of nodal tissue in the central mesentery, maximum short axis diameter 1.3 cm. This nodal cluster is relatively low in density and not appreciably changed from the prior exam. Given the lack of change is may represent quiescent residua of malignancy, but again, surveillance is likely warranted. 3. The left-sided rib fractures are more numerous than was revealed on the prior radiographs ; there are nondisplaced healing fractures of the left fifth, sixth, seventh, eighth, ninth, and tenth ribs. 4. Mild cardiomegaly although part of this appearance may be due to pectus excavatum. 5. Several additional small pulmonary nodules are stable from 2015 and considered benign. 6. Right hemicolectomy and sigmoid colon diverticulosis. 7. Small right paraumbilical hernia containing adipose tissue. 8. Lower lumbar spondylosis and degenerative disc disease potentially with mild impingement at L4-5.   04/25/2017 Imaging   CT C/A/P: Scattered solid pulmonary nodules measure up to 6 mm in the right lower lobe, unchanged. There are a few scattered ground-glass nodules measuring up to 8 mm in the right upper lobe, also stable. Distal perigastric lymph nodes are stable. No additional evidence of metastatic disease.   Metastatic colon cancer to liver ([facility])  11/04/2020 Initial Diagnosis   Metastatic colon  cancer to liver [facility])   11/10/2020 -  Chemotherapy    Patient is on Treatment Plan: COLORECTAL FOLFIRI / BEVACIZUMAB Q14D        CANCER STAGING: Cancer  Staging No matching staging information was found for the patient.  INTERVAL HISTORY:  Amanda Norris, a 75 y.o. female, returns for routine follow-up and consideration for first cycle of chemotherapy. Amanda Norris was last seen on 10/21/2020.  Due for initiating cycle #1 of FOLFIRI, Avastin and Aloxi today.   Today she is accompanied by her husband. Overall, she tells me she has been feeling fair. She recalls feeling extremely weak and anxious while receiving FOLFOX in 2015, but denied feeling nauseous. She continues taking Imodium BID for her diarrhea; she denies having abdominal cramping, though she reports having epigastric abdominal pain after eating lasting for an hour and antacids did not help. She denies currently having numbness or tingling. She denies having any leg swelling or pain. Her appetite is at baseline and she is drinking 1 can of Boost daily.  Overall, she feels ready for first cycle of chemo today.    REVIEW OF SYSTEMS:  Review of Systems  Constitutional: Positive for appetite change (60%) and fatigue (25%).  Respiratory: Positive for shortness of breath (w/ exertion).   Cardiovascular: Positive for palpitations (w/ exertion). Negative for leg swelling.  Gastrointestinal: Positive for diarrhea (on Imodium BID). Negative for abdominal pain.  Musculoskeletal: Negative for arthralgias and myalgias.  Neurological: Negative for numbness.  Psychiatric/Behavioral: Positive for depression and sleep disturbance (d/t pain). The patient is nervous/anxious.   All other systems reviewed and are negative.   PAST MEDICAL/SURGICAL HISTORY:  Past Medical History:  Diagnosis Date  . Arthritis    wrist and thumbs  . Colon cancer ([facility])   . PONV (postoperative nausea and vomiting)   . Port-A-Cath in place 11/09/2020  . Ruptured lumbar disc    L1-L5 per patient report  . Sciatica of left side    Past Surgical History:  Procedure Laterality Date  . COLONOSCOPY N/A 12/12/2013   Procedure:  COLONOSCOPY;  Surgeon: Rogene Houston, MD;  [facility];  Service: Endoscopy;  Laterality: N/A;  730  . COLONOSCOPY N/A 01/20/2015   Procedure: COLONOSCOPY;  Surgeon: Rogene Houston, MD;  Location: [facility];  Service: Endoscopy;  Laterality: N/A;  1030  . COLONOSCOPY N/A 03/28/2018   Procedure: COLONOSCOPY;  Surgeon: Rogene Houston, MD;  Location: [facility];  Service: Endoscopy;  Laterality: N/A;  1015  . EUS N/A 12/18/2013   Procedure: UPPER ENDOSCOPIC ULTRASOUND (EUS) LINEAR;  Surgeon: Milus Banister, MD;  Location: [facility];  Service: Endoscopy;  Laterality: N/A;  . herniated disc     [id number]  . IR IMAGING GUIDED PORT INSERTION  10/14/2020  . IR US GUIDANCE  10/14/2020  . PARTIAL COLECTOMY N/A 12/29/2013   Procedure: RIGHT HEMICOLECTOMY;  Surgeon: Jamesetta So, MD;  Location: [facility] ORS;  Service: General;  Laterality: N/A;  . POLYPECTOMY  03/28/2018   Procedure: POLYPECTOMY;  Surgeon: Rogene Houston, MD;  Location: [facility];  Service: Endoscopy;;  transverse colon (hot snare);  Marland Kitchen PORT-A-CATH REMOVAL Right 04/01/2014   Procedure: MINOR REMOVAL PORT-A-CATH;  Surgeon: Jamesetta So, MD;  Location: [facility];  Service: General;  Laterality: Right;  . PORTACATH PLACEMENT Right 02/04/2014   Procedure: INSERTION PORT-A-CATH;  Surgeon: Jamesetta So, MD;  Location: [facility];  Service: General;  Laterality: Right;    SOCIAL  HISTORY:  Social History   Socioeconomic History  . Marital status: Married    Spouse name: Not on file  . Number of children: Not on file  . Years of education: Not on file  . Highest education level: Not on file  Occupational History  . Not on file  Tobacco Use  . Smoking status: Former Smoker    Packs/day: 0.25    Years: 5.00    Pack years: 1.25    Types: Cigarettes    Quit date: 04/19/1959    Years since quitting: 61.6  . Smokeless tobacco: Never Used  . Tobacco comment: smoked for 5 years as teenager  Vaping Use  . Vaping Use:  Never used  Substance and Sexual Activity  . Alcohol use: No  . Drug use: No  . Sexual activity: Yes    Birth control/protection: Post-menopausal  Other Topics Concern  . Not on file  Social History Narrative  . Not on file   Social Determinants of Health   Financial Resource Strain: Low Risk   . Difficulty of Paying Living Expenses: Not hard at all  Food Insecurity: No Food Insecurity  . Worried About Charity fundraiser in the Last Year: Never true  . Ran Out of Food in the Last Year: Never true  Transportation Needs: No Transportation Needs  . Lack of Transportation (Medical): No  . Lack of Transportation (Non-Medical): No  Physical Activity: Inactive  . Days of Exercise per Week: 0 days  . Minutes of Exercise per Session: 0 min  Stress: Stress Concern Present  . Feeling of Stress : To some extent  Social Connections: Socially Integrated  . Frequency of Communication with Friends and Family: More than three times a week  . Frequency of Social Gatherings with Friends and Family: More than three times a week  . Attends Religious Services: More than 4 times per year  . Active Member of Clubs or Organizations: Yes  . Attends Archivist Meetings: More than 4 times per year  . Marital Status: Married  Human resources officer Violence: Not At Risk  . Fear of Current or Ex-Partner: No  . Emotionally Abused: No  . Physically Abused: No  . Sexually Abused: No    FAMILY HISTORY:  Family History  Problem Relation Age of Onset  . Diabetes type II Mother   . Dementia Father     CURRENT MEDICATIONS:  Current Outpatient Medications  Medication Sig Dispense Refill  . acetaminophen (TYLENOL) 500 MG tablet Take 500 mg by mouth every 6 (six) hours as needed for moderate pain or headache.    Marland Kitchen aspirin 81 MG EC tablet Take 1 tablet (81 mg total) by mouth daily. Swallow whole. 30 tablet 12  . Bioflavonoid Products (ESTER C PO) Take 1,000 mg by mouth daily.    . Cholecalciferol  (D3-1000) 25 MCG (1000 UT) capsule Take 125 Units by mouth daily.    . Cranberry-Vitamin C-Vitamin E 4200-20-3 MG-MG-UNIT CAPS Take 500 mg by mouth daily.    . Garlic 2536 MG CAPS Take 1,000 mg by mouth daily.     Marland Kitchen loperamide (IMODIUM A-D) 2 MG tablet Take 2 mg by mouth 4 (four) times daily as needed for diarrhea or loose stools.    Marland Kitchen loperamide (IMODIUM A-D) 2 MG tablet Take 1 tablet (2 mg total) by mouth as needed. Take 2 at diarrhea onset, then 1 tablet after each loose stool.  DO NOT exceed 8 tablets in a 24 hour period.  May take 2 every 4hrs at night. If diarrhea recurs repeat. 100 tablet 1  . Multiple Vitamin (MULTIVITAMIN) tablet Take 1 tablet by mouth daily.    . Omega-3 Fatty Acids (FISH OIL) 1200 MG CAPS Take 1,000 mg by mouth daily.    Marland Kitchen lidocaine-prilocaine (EMLA) cream Apply small amount to port a cath site and cover with plastic wrap 1 hour prior to infusion appointments (Patient not taking: Reported on 11/10/2020) 30 g 3  . prochlorperazine (COMPAZINE) 10 MG tablet Take 1 tablet (10 mg total) by mouth every 6 (six) hours as needed (NAUSEA). (Patient not taking: Reported on 11/10/2020) 30 tablet 1   No current facility-administered medications for this visit.    ALLERGIES:  Allergies  Allergen Reactions  . Sulfa Antibiotics Other (See Comments)    "Sores in my mouth"    PHYSICAL EXAM:  Performance status (ECOG): 1 - Symptomatic but completely ambulatory  Vitals:   11/10/20 0832  BP: (!) 149/72  Pulse: 74  Resp: 20  Temp: (!) 97.3 F (36.3 C)  SpO2: 99%   Wt Readings from Last 3 Encounters:  11/10/20 153 lb (69.4 kg)  11/09/20 155 lb (70.3 kg)  10/21/20 155 lb 3.2 oz (70.4 kg)   Physical Exam Vitals reviewed.  Constitutional:      Appearance: Normal appearance.  Cardiovascular:     Rate and Rhythm: Normal rate and regular rhythm.     Pulses: Normal pulses.     Heart sounds: Normal heart sounds.  Pulmonary:     Effort: Pulmonary effort is normal.     Breath  sounds: Normal breath sounds.  Chest:  Breasts:     Right: No supraclavicular adenopathy.     Left: No supraclavicular adenopathy.      Comments: Port-a-Cath in R chest Abdominal:     Palpations: Abdomen is soft. There is no mass.     Tenderness: There is no abdominal tenderness.  Musculoskeletal:     Right lower leg: No edema.     Left lower leg: No edema.  Lymphadenopathy:     Cervical: No cervical adenopathy.     Upper Body:     Right upper body: No supraclavicular adenopathy.     Left upper body: No supraclavicular adenopathy.  Neurological:     General: No focal deficit present.     Mental Status: She is alert and oriented to person, place, and time.  Psychiatric:        Mood and Affect: Mood normal.        Behavior: Behavior normal.     LABORATORY DATA:  I have reviewed the labs as listed.  CBC Latest Ref Rng & Units 11/10/2020 11/09/2020 09/28/2020  WBC 4.0 - 10.5 K/uL 6.7 8.6 7.6  Hemoglobin 12.0 - 15.0 g/dL 13.2 13.0 13.7  Hematocrit 36.0 - 46.0 % 41.4 41.2 42.3  Platelets 150 - 400 K/uL 226 243 236   CMP Latest Ref Rng & Units 11/10/2020 09/28/2020 09/17/2020  Glucose 70 - 99 mg/dL 154(H) 109(H) 98  BUN 8 - 23 mg/dL 11 10 14   Creatinine 0.44 - 1.00 mg/dL 0.72 0.52 0.68  Sodium 135 - 145 mmol/L 136 141 137  Potassium 3.5 - 5.1 mmol/L 3.6 4.0 4.0  Chloride 98 - 111 mmol/L 101 105 103  CO2 22 - 32 mmol/L 27 25 26   Calcium 8.9 - 10.3 mg/dL 9.6 9.6 9.6  Total Protein 6.5 - 8.1 g/dL 7.9 8.2(H) 7.4  Total Bilirubin 0.3 - 1.2 mg/dL 0.6 0.8  0.3  Alkaline Phos 38 - 126 U/L 70 76 66  AST 15 - 41 U/L 24 22 21   ALT 0 - 44 U/L 16 16 14    Lab Results  Component Value Date   CEA1 21.4 (H) 09/17/2020   CEA1 4.1 05/01/2019   CEA1 4.6 10/28/2018    DIAGNOSTIC IMAGING:  I have independently reviewed the scans and discussed with the patient. NM PET Image Restag (PS) Skull Base To Thigh  Result Date: 11/02/2020 CLINICAL DATA:  Subsequent treatment strategy for colon cancer  with metastatic disease. EXAM: NUCLEAR MEDICINE PET SKULL BASE TO THIGH TECHNIQUE: 7.26 mCi F-18 FDG was injected intravenously. Full-ring PET imaging was performed from the skull base to thigh after the radiotracer. CT data was obtained and used for attenuation correction and anatomic localization. Fasting blood glucose: 99 mg/dl COMPARISON:  Multiple prior studies, most recent exam from March of 2022. FINDINGS: Mediastinal blood pool activity: SUV max 2.8 Liver activity: SUV max NA NECK: LEFT thoracic inlet lymph node with enlargement without significant corresponding FDG uptake. Maximum SUV of 3.1. Node measuring 2.2 cm short axis similar to previous imaging from September 27, 2020 No additional signs of nodal enlargement in the neck. Incidental CT findings: none CHEST: Enlarged lymph nodes in the chest, further enlargement of lymph nodes along the RIGHT paratracheal chain since the previous study. 95/3 1.8 cm RIGHT paratracheal lymph node previously less than a cm with a maximum SUV of 3.4. 82/3 a 1 cm lymph node in the superior mediastinum between the LEFT carotid and brachiocephalic origins with a maximum SUV of 5.4 Precarinal lymph node on image 99/3 approximately 1 cm, previously less than a cm with a maximum SUV of 3.9 Activity in the RIGHT hilum with suspected lymph node in this location, similar FDG uptake to mediastinal lymph nodes. Bulky retrocrural lymph node 2.5 cm with moderate FDG uptake approximately 4.04 maximum SUV. Similar uptake tracking into upper abdominal lymph nodes. Numerous bilateral pulmonary nodules which have enlarged since the previous study. Also multiple new pulmonary nodules are evident most less than a cm. Is Other nodules that were present previously show similar enlargement. RIGHT upper lobe pulmonary nodule (image 77, series 3) 11 mm, previously 8 mm. Image 110/3 1.3 cm RIGHT lower lobe pulmonary nodule with a maximum SUV of 1.6. This previously measured approximately 0.7 cm.  Incidental CT findings: RIGHT-sided Port-A-Cath in place. Heart size stable. No substantial pericardial effusion. Aorta is normal caliber. Central pulmonary vessels normal caliber. Limited assessment of cardiovascular structures given lack of intravenous contrast. ABDOMEN/PELVIS: Bulky mesenteric mass (image 194, series 3) 5.0 x 4.1 cm. The dominant portion of the mass, central portion shows limited FDG uptake. Associated mesenteric lymph nodes on image 206 of series 3 with intense metabolic activity maximum SUV in this area of approximately 9.3. Lymph nodes have enlarged within the small bowel mesentery since the previous study. For example on image 209 of series 3 a 2 mm lymph node previously measured approximately 7 mm. Other lymph nodes in this area have enlarged to a similar extent compared to recent imaging. Retroperitoneal adenopathy with similar size 2.1 cm lymph node on 186/3. LEFT periaortic lymph node on 186/3 2 cm. These are unchanged and with moderate FDG uptake Focal hepatic lesion increased in size 2.6 x 2.1 as compared to 1.4 x 0.9 cm with maximum SUV of 4.7. Subtle area of hypodensity in the dome of the RIGHT hemi liver on image 145 of series 3 suggested but not confirmed.  Mildly heterogeneous uptake throughout the liver without additional discrete focus of increased metabolic activity Incidental CT findings: No acute findings related to liver, gallbladder, spleen, pancreas and adrenal glands. Smooth contour the kidneys without hydronephrosis or perinephric stranding. Postoperative changes related to partial colectomy. No ascites. Uterus and adnexal structures grossly normal. SKELETON: No focal hypermetabolic activity to suggest skeletal metastasis. Incidental CT findings: none IMPRESSION: Interval worsening of disease since the prior study with enlarging lymph nodes in the chest and abdomen as described. Increasing number and size of pulmonary nodules. Areas that were largest on the prior study and  showed low attenuation are little changed and show limited FDG uptake. Areas that have developed and or enlarged in the interval show moderate FDG uptake with most pronounced uptake in lymph nodes adjacent to the dominant mesenteric mass in the small bowel mesentery. Increased size of dominant hepatic lesion. Questionable area developing in the dome of the RIGHT hemi liver. Attention on follow-up. Electronically Signed   By: Zetta Bills M.D.   On: 11/02/2020 12:19   US Abdomen Limited  Result Date: 11/10/2020 INDICATION: 75 year old female with a history of mucinous adenocarcinoma of the colon with imaging evidence concerning for multifocal metastatic disease. However, prior biopsies tend to yield a majority of mucinous tissue and only rare cancer cells. Patient is undergone prior biopsy of 2 sites of nodal metastatic disease which has not yielded sufficient cellular material to allow for advanced molecular testing. She presents for biopsy of a hypermetabolic lesion in the liver in an effort to obtain adequate tissue for molecular testing. EXAM: CT-guided biopsy of liver lesion. MEDICATIONS: None. ANESTHESIA/SEDATION: Moderate (conscious) sedation was employed during this procedure. A total of Versed mg and Fentanyl mcg was administered intravenously. Moderate Sedation Time: minutes. The patient's level of consciousness and vital signs were monitored continuously by radiology nursing throughout the procedure under my direct supervision. FLUOROSCOPY TIME:  Fluoroscopy Time:  minutes  seconds ( mGy). COMPLICATIONS: None immediate. PROCEDURE: Informed written consent was obtained from the patient after a thorough discussion of the procedural risks, benefits and alternatives. All questions were addressed. Maximal Sterile Barrier Technique was utilized including caps, mask, sterile gowns, sterile gloves, sterile drape, hand hygiene and skin antiseptic. A timeout was performed prior to the initiation of the  procedure. Initially, the abdomen was interrogated with ultrasound. A very subtle isoechoic lesion is identified in the region of the known hypermetabolic hepatic mass. The overlying skin was sterilely prepped and draped in the standard fashion with chlorhexidine skin prep. Local anesthesia was attained by infiltration with 1% lidocaine. A small dermatotomy was made. Next, under real-time ultrasound guidance, an attempt was made to advance a 17 gauge introducer needle into the margin of the mass. However, as the patient became sedated and her respirations more shallow, the liver retracted upward under the ribs and adequate visualization could not be achieved. Therefore, the procedure was stopped and the patient transported to CT to attempt biopsy under CT imaging guidance. A planning axial CT scan was performed after the patient was successfully positioned supine on the CT gantry. The hypoechoic 2.7 x 2.0 cm lesion is easily identified. A suitable skin entry site was selected and marked. The overlying skin was again sterilely prepped and draped with chlorhexidine in the standard fashion. Local anesthesia was again attained by infiltration with 1% lidocaine. A second small dermatotomy was made. Under intermittent CT guidance, a 17 gauge introducer needle was carefully advanced into the mass. CT imaging confirms presence of the  needle within the mass. Multiple 18 gauge core biopsies were then obtained coaxially using the bio Pince automated biopsy device. 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 Best obtainable specimens were collected. Gel-Foam embolization of the biopsy tract as the introducer needle was removed. Post biopsy CT imaging demonstrates no evidence of immediate complication. IMPRESSION: 1. Aborted attempted ultrasound-guided biopsy secondary to poor visualization of the mass. 2. Successful CT-guided biopsy of hypermetabolic liver lesion. Of  note, biopsy specimens are highly gelatinous consistent with the known clinical history of mucinous adenocarcinoma. Numerous biopsies from the center and margin of the lesion were obtained. These are the best obtainable specimens. Further percutaneous sampling is unlikely to yield any additional diagnostic material. Electronically Signed   By: Jacqulynn Cadet M.D.   On: 11/10/2020 09:21   IR US Guidance  Result Date: 10/14/2020 INDICATION: 75 year old with history of colon cancer. Recent imaging is suggestive for metastatic disease and patient had an inconclusive mesenteric lymph node biopsy. Patient also has a suspicious left supraclavicular lymph node. EXAM: ULTRASOUND-GUIDED CORE BIOPSY OF LEFT SUPRACLAVICULAR LYMPH NODE MEDICATIONS: Moderate sedation ANESTHESIA/SEDATION: Moderate (conscious) sedation was employed during this procedure. A total of Versed 4.0 mg and Fentanyl 100 mcg was administered intravenously. Moderate Sedation Time: 21 minutes. The patient's level of consciousness and vital signs were monitored continuously by radiology nursing throughout the procedure under my direct supervision. FLUOROSCOPY TIME:  None COMPLICATIONS: None immediate. PROCEDURE: Informed written consent was obtained from the patient after a thorough discussion of the procedural risks, benefits and alternatives. All questions were addressed. A timeout was performed prior to the initiation of the procedure. Left side of the neck was evaluated with ultrasound. An enlarged left supraclavicular lymph node was identified. The skin was prepped with chlorhexidine and sterile field was created. Maximal barrier sterile technique was utilized including caps, mask, sterile gowns, sterile gloves, sterile drape, hand hygiene and skin antiseptic. Skin was anesthetized using 1% lidocaine. Small incision was made. Using ultrasound guidance, an 18 gauge core device was directed into the left supraclavicular lymph node. Multiple core  biopsies were obtained. Specimens placed in saline. Bandage placed over the puncture site. FINDINGS: Heterogeneous hypoechoic lymph node lateral to the left internal jugular vein. Findings correspond with the previous CT findings. Echotexture is similar to the mesenteric lesion that was previously biopsied. IMPRESSION: Ultrasound-guided core biopsies of the abnormal left supraclavicular lymph node. Electronically Signed   By: Markus Daft M.D.   On: 10/14/2020 17:00   IR IMAGING GUIDED PORT INSERTION  Result Date: 10/14/2020 INDICATION: 75 year old with history of colon cancer and evidence for metastatic disease on recent imaging. EXAM: FLUOROSCOPIC AND ULTRASOUND GUIDED PLACEMENT OF A SUBCUTANEOUS PORT COMPARISON:  None. MEDICATIONS: Moderate sedation ANESTHESIA/SEDATION: Fentanyl 100 mcg. Patient was given fentanyl and Versed immediately prior to this procedure for the left neck lymph node biopsy. Moderate Sedation Time:  27 minutes The patient was continuously monitored during the procedure by the interventional radiology nurse under my direct supervision. FLUOROSCOPY TIME:  24 seconds, 2 mGy COMPLICATIONS: None immediate. PROCEDURE: The procedure, risks, benefits, and alternatives were explained to the patient. Questions regarding the procedure were encouraged and answered. The patient understands and consents to the procedure. Patient was placed supine on the interventional table. Ultrasound confirmed a patent right internal jugular vein. Ultrasound image was saved for documentation. The right chest and neck were cleaned with a skin antiseptic and a sterile drape was placed. Maximal barrier sterile technique was utilized  including caps, mask, sterile gowns, sterile gloves, sterile drape, hand hygiene and skin antiseptic. The right neck was anesthetized with 1% lidocaine. Small incision was made in the right neck with a blade. Micropuncture set was placed in the right internal jugular vein with ultrasound  guidance. The micropuncture wire was used for measurement purposes. The right chest was anesthetized with 1% lidocaine with epinephrine. #15 blade was used to make an incision and a subcutaneous port pocket was formed. [place] was assembled. Subcutaneous tunnel was formed with a stiff tunneling device. The port catheter was brought through the subcutaneous tunnel. The port was placed in the subcutaneous pocket and sutured in place. The micropuncture set was exchanged for a peel-away sheath. The catheter was placed through the peel-away sheath and the tip was positioned at the superior cavoatrial junction. Catheter placement was confirmed with fluoroscopy. The port was accessed and flushed with heparinized saline. The port pocket was closed using two layers of absorbable sutures and Dermabond. The vein skin site was closed using a single layer of absorbable suture and Dermabond. Sterile dressings were applied. Patient tolerated the procedure well without an immediate complication. Ultrasound and fluoroscopic images were taken and saved for this procedure. IMPRESSION: Placement of a subcutaneous port device. Catheter tip at the superior cavoatrial junction. Electronically Signed   By: Markus Daft M.D.   On: 10/14/2020 17:03   CT LIVER MASS BIOPSY  Result Date: 11/10/2020 INDICATION: 75 year old female with a history of mucinous adenocarcinoma of the colon with imaging evidence concerning for multifocal metastatic disease. However, prior biopsies tend to yield a majority of mucinous tissue and only rare cancer cells. Patient is undergone prior biopsy of 2 sites of nodal metastatic disease which has not yielded sufficient cellular material to allow for advanced molecular testing. She presents for biopsy of a hypermetabolic lesion in the liver in an effort to obtain adequate tissue for molecular testing. EXAM: CT-guided biopsy of liver lesion. MEDICATIONS: None. ANESTHESIA/SEDATION: Moderate (conscious)  sedation was employed during this procedure. A total of Versed mg and Fentanyl mcg was administered intravenously. Moderate Sedation Time: minutes. The patient's level of consciousness and vital signs were monitored continuously by radiology nursing throughout the procedure under my direct supervision. FLUOROSCOPY TIME:  Fluoroscopy Time:  minutes  seconds ( mGy). COMPLICATIONS: None immediate. PROCEDURE: Informed written consent was obtained from the patient after a thorough discussion of the procedural risks, benefits and alternatives. All questions were addressed. Maximal Sterile Barrier Technique was utilized including caps, mask, sterile gowns, sterile gloves, sterile drape, hand hygiene and skin antiseptic. A timeout was performed prior to the initiation of the procedure. Initially, the abdomen was interrogated with ultrasound. A very subtle isoechoic lesion is identified in the region of the known hypermetabolic hepatic mass. The overlying skin was sterilely prepped and draped in the standard fashion with chlorhexidine skin prep. Local anesthesia was attained by infiltration with 1% lidocaine. A small dermatotomy was made. Next, under real-time ultrasound guidance, an attempt was made to advance a 17 gauge introducer needle into the margin of the mass. However, as the patient became sedated and her respirations more shallow, the liver retracted upward under the ribs and adequate visualization could not be achieved. Therefore, the procedure was stopped and the patient transported to CT to attempt biopsy under CT imaging guidance. A planning axial CT scan was performed after the patient was successfully positioned supine on the CT gantry. The hypoechoic 2.7 x 2.0 cm lesion is easily identified. A  suitable skin entry site was selected and marked. The overlying skin was again sterilely prepped and draped with chlorhexidine in the standard fashion. Local anesthesia was again attained by infiltration with 1%  lidocaine. A second small dermatotomy was made. Under intermittent CT guidance, a 17 gauge introducer needle was carefully advanced into the mass. CT imaging confirms presence of the needle within the mass. Multiple 18 gauge core biopsies were then obtained coaxially using the bio Pince automated biopsy device. 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 Best obtainable specimens were collected. Gel-Foam embolization of the biopsy tract as the introducer needle was removed. Post biopsy CT imaging demonstrates no evidence of immediate complication. IMPRESSION: 1. Aborted attempted ultrasound-guided biopsy secondary to poor visualization of the mass. 2. Successful CT-guided biopsy of hypermetabolic liver lesion. Of note, biopsy specimens are highly gelatinous consistent with the known clinical history of mucinous adenocarcinoma. Numerous biopsies from the center and margin of the lesion were obtained. These are the best obtainable specimens. Further percutaneous sampling is unlikely to yield any additional diagnostic material. Electronically Signed   By: Jacqulynn Cadet M.D.   On: 11/10/2020 09:21     ASSESSMENT:  1. 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 -Status post 3 cycles of FOLFOX from 02/10/2014 through 03/10/2014, discontinued secondary to poor tolerance. DPD was negative. -Last colonoscopy on 03/28/2018 patent ileo-colonic anastomosis. 8 mm polyp in the transverse colon. Diverticulosis in the sigmoid colon, descending colon. -CT scan on 11/04/2018 shows postoperative findings of right colectomy with redemonstrated mesenteric nodule/lymph node measuring 1.9 x 1.7 cm, unchanged from prior scan in October 2019. No evidence of metastatic disease. Stable solid and groundglass pulmonary nodules,  stable over multiple prior exams. -CEA was 4.6 on 10/28/2018. -CTAP on 09/21/2020 showed numerous small pulmonary nodules throughout the lung bases, 5 mm. Enlarged retrocrural periaortic lymph node in the lower chest. Soft tissue nodule within the central mesentery substantially increased in size, encasing proximal superior mesenteric artery and measuring 5 x 4.3 cm. Numerous retroperitoneal lymph nodes, largest aortocaval lymph node measuring 2.8 x 2.2 cm. -CT chest on 09/27/2020 shows a left thoracic inlet lymph node approximately 2.3 cm. Mediastinal lymph nodes and multiple subcentimeter pulmonary nodules suggestive of metastatic disease. -Mesenteric lymph node biopsy on 09/28/2020 shows mucin and fibrosis. -Left supraclavicular lymph node biopsy on 10/14/2020-soft tissue with abundant mucin, rare fragments of atypical columnar epithelium.  2. Health maintenance: -Mammogram dated 05/16/2018 was BI-RADS Category 1.   PLAN:  1.Metastatic colon cancer to the liver, lungs and lymph nodes: - She finally had successful biopsy of the liver lesion which was consistent with mucinous adenocarcinoma metastatic from the colon. - We will send it for [employer] ration sequencing. - We discussed palliative chemotherapy with FOLFIRI and bevacizumab.  She had some intolerance to FOLFOX and could receive only 3 cycles in the adjuvant setting.  She reported it was mainly with cold sensitivity. - We reviewed her labs today which showed normal LFTs, renal function and electrolytes.  CBC was grossly normal. - We will proceed with first cycle of FOLFIRI at 60% dose.  We will introduce bevacizumab during cycle 2.  We discussed about the side effects of abdominal cramping and diarrhea in detail. - She will come back in 1 week for follow-up.   2.Diarrhea: -She is taking Imodium 1 tablet twice daily. - She will increase  the Imodium if her diarrhea gets worse with chemotherapy.  3.  Abdominal pain: - She reports  abdominal pain 20 minutes after eating and lasts about an hour.  She typically presses her abdomen with her hands which relieves the pain.  We will keep a close eye on it.  Hopefully improves with chemotherapy.  4.  Nutrition: - Continue 1 can of boost per day along with meals.  We will closely monitor weight.   Orders placed this encounter:  No orders of the defined types were placed in this encounter.    Derek Jack, MD [place] [phone]   I, Milinda Antis, am acting as a scribe for Dr. Sanda Linger.  I, Derek Jack MD, have reviewed the above documentation for accuracy and completeness, and I agree with the above.

## 2020-11-10 NOTE — Patient Instructions (Signed)
[facility] at [facility] Discharge Instructions  You were seen today by Dr. Delton Coombes. He went over your recent results. You received your first treatment today. Take Imodium 2 tablets with the first watery bowel movement, then take 1 tablet after every watery BM; DO NOT take if your stool is soft and not watery. Take Compazine every 6 hours as needed for nausea. Drink 1-2 cans of Ensure/Boost daily to maintain your weight and energy levels. Dr. Delton Coombes will see you back in 1 week for labs and follow up.   Thank you for choosing [place] at [facility] to provide your oncology and hematology care.  To afford each patient quality time with our provider, please arrive at least 15 minutes before your scheduled appointment time.   If you have a lab appointment with the [facility] please come in thru the Main Entrance and check in at the main information desk  You need to re-schedule your appointment should you arrive 10 or more minutes late.  We strive to give you quality time with our providers, and arriving late affects you and other patients whose appointments are after yours.  Also, if you no show three or more times for appointments you may be dismissed from the clinic at the providers discretion.     Again, thank you for choosing [facility].  Our hope is that these requests will decrease the amount of time that you wait before being seen by our physicians.       _____________________________________________________________  Should you have questions after your visit to [facility], please contact our office at [phone] between the hours of 8:00 a.m. and 4:30 p.m.  Voicemails left after 4:00 p.m. will not be returned until the following business day.  For prescription refill requests, have your pharmacy contact our office and allow 72 hours.    Cancer Center Support Programs:   > Cancer Support Group  2nd  Tuesday of the month 1pm-2pm, Journey Room

## 2020-11-11 ENCOUNTER — Ambulatory Visit (HOSPITAL_COMMUNITY): Payer: Medicare Other

## 2020-11-11 ENCOUNTER — Other Ambulatory Visit (HOSPITAL_COMMUNITY): Payer: Medicare Other

## 2020-11-11 ENCOUNTER — Ambulatory Visit (HOSPITAL_COMMUNITY): Payer: Medicare Other | Admitting: Hematology

## 2020-11-12 ENCOUNTER — Encounter (HOSPITAL_COMMUNITY): Payer: Self-pay

## 2020-11-12 ENCOUNTER — Other Ambulatory Visit: Payer: Self-pay

## 2020-11-12 ENCOUNTER — Inpatient Hospital Stay (HOSPITAL_COMMUNITY): Payer: Medicare Other

## 2020-11-12 VITALS — BP 154/56 | HR 58 | Temp 97.5°F | Resp 18

## 2020-11-12 DIAGNOSIS — C7801 Secondary malignant neoplasm of right lung: Secondary | ICD-10-CM | POA: Diagnosis not present

## 2020-11-12 DIAGNOSIS — R197 Diarrhea, unspecified: Secondary | ICD-10-CM | POA: Diagnosis not present

## 2020-11-12 DIAGNOSIS — Z5112 Encounter for antineoplastic immunotherapy: Secondary | ICD-10-CM | POA: Diagnosis not present

## 2020-11-12 DIAGNOSIS — C189 Malignant neoplasm of colon, unspecified: Secondary | ICD-10-CM

## 2020-11-12 DIAGNOSIS — C787 Secondary malignant neoplasm of liver and intrahepatic bile duct: Secondary | ICD-10-CM

## 2020-11-12 DIAGNOSIS — Z95828 Presence of other vascular implants and grafts: Secondary | ICD-10-CM

## 2020-11-12 DIAGNOSIS — C770 Secondary and unspecified malignant neoplasm of lymph nodes of head, face and neck: Secondary | ICD-10-CM | POA: Diagnosis not present

## 2020-11-12 DIAGNOSIS — C184 Malignant neoplasm of transverse colon: Secondary | ICD-10-CM | POA: Diagnosis not present

## 2020-11-12 LAB — SURGICAL PATHOLOGY

## 2020-11-12 MED ORDER — SODIUM CHLORIDE 0.9% FLUSH
10.0000 mL | INTRAVENOUS | Status: DC | PRN
  Administered 2020-11-12: 10 mL

## 2020-11-12 MED ORDER — HEPARIN SOD (PORK) LOCK FLUSH 100 UNIT/ML IV SOLN
500.0000 [IU] | Freq: Once | INTRAVENOUS | Status: AC | PRN
  Administered 2020-11-12: 500 [IU]

## 2020-11-12 NOTE — Progress Notes (Signed)
Patient presents today for pump d/c. Vital signs are stable. Port a cath site clean, dry, and intact. Port flushed with 10 mls of Normal Saline and 500 Units of Heparin. Needle removed intact. Band aid applied. Patient has no complaints at this time. Discharged from clinic ambulatory and in stable condition. Patient alert and oriented.  

## 2020-11-12 NOTE — Patient Instructions (Signed)
[facility]  Discharge Instructions: Thank you for choosing [facility] to provide your oncology and hematology care.  If you have a lab appointment with the [facility], please come in thru the Main Entrance and check in at the main information desk.  Wear comfortable clothing and clothing appropriate for easy access to any Portacath or PICC line.   We strive to give you quality time with your provider. You may need to reschedule your appointment if you arrive late (15 or more minutes).  Arriving late affects you and other patients whose appointments are after yours.  Also, if you miss three or more appointments without notifying the office, you may be dismissed from the clinic at the provider's discretion.      For prescription refill requests, have your pharmacy contact our office and allow 72 hours for refills to be completed.    Today you received the following chemotherapy 64fu pump.    To help prevent nausea and vomiting after your treatment, we encourage you to take your nausea medication as directed.  BELOW ARE SYMPTOMS THAT SHOULD BE REPORTED IMMEDIATELY: . *FEVER GREATER THAN 100.4 F (38 C) OR HIGHER . *CHILLS OR SWEATING . *NAUSEA AND VOMITING THAT IS NOT CONTROLLED WITH YOUR NAUSEA MEDICATION . *UNUSUAL SHORTNESS OF BREATH . *UNUSUAL BRUISING OR BLEEDING . *URINARY PROBLEMS (pain or burning when urinating, or frequent urination) . *BOWEL PROBLEMS (unusual diarrhea, constipation, pain near the anus) . TENDERNESS IN MOUTH AND THROAT WITH OR WITHOUT PRESENCE OF ULCERS (sore throat, sores in mouth, or a toothache) . UNUSUAL RASH, SWELLING OR PAIN  . UNUSUAL VAGINAL DISCHARGE OR ITCHING   Items with * indicate a potential emergency and should be followed up as soon as possible or go to the Emergency Department if any problems should occur.  Please show the CHEMOTHERAPY ALERT CARD or IMMUNOTHERAPY ALERT CARD at check-in to the Emergency Department and  triage nurse.  Should you have questions after your visit or need to cancel or reschedule your appointment, please contact [facility] [phone]  and follow the prompts.  Office hours are 8:00 a.m. to 4:30 p.m. Monday - Friday. Please note that voicemails left after 4:00 p.m. may not be returned until the following business day.  We are closed weekends and major holidays. You have access to a nurse at all times for urgent questions. Please call the main number to the clinic [phone] and follow the prompts.  For any non-urgent questions, you may also contact your provider using MyChart. We now offer e-Visits for anyone 71 and older to request care online for non-urgent symptoms. For details visit [email or web address].   Also download the MyChart app! Go to the app store, search "MyChart", open the app, select [facility], and log in with your MyChart username and password.  Due to Covid, a mask is required upon entering the hospital/clinic. If you do not have a mask, one will be given to you upon arrival. For doctor visits, patients may have 1 support person aged 14 or older with them. For treatment visits, patients cannot have anyone with them due to current Covid guidelines and our immunocompromised population.

## 2020-11-12 NOTE — Progress Notes (Signed)
Caris testing requested on 11/11/20 per Dr. Delton Coombes

## 2020-11-15 NOTE — Progress Notes (Signed)
24 hour call back . Patient denies vomiting and diarrhea. Patient states she has had nausea with no decrease in appetite. Patient states she is eating and drinking in smaller, more frequent meals. Patient has no complaints of pain but states she on occasion has a feeling of tightness , midsternum per patient's words. Patient states, " I can take a nausea pill and it goes away. " Patient instructed to call the clinic if unable to eat or drink, diarrhea or vomiting. Understanding verbalized. Patient does have complaints of fatigue. Patient teaching performed. Understanding verbalized.

## 2020-11-17 ENCOUNTER — Other Ambulatory Visit (HOSPITAL_COMMUNITY): Payer: Medicare Other

## 2020-11-17 ENCOUNTER — Ambulatory Visit (HOSPITAL_COMMUNITY): Payer: Medicare Other | Admitting: Hematology

## 2020-11-19 ENCOUNTER — Inpatient Hospital Stay (HOSPITAL_COMMUNITY): Payer: Medicare Other

## 2020-11-19 ENCOUNTER — Inpatient Hospital Stay (HOSPITAL_COMMUNITY): Payer: Medicare Other | Attending: Hematology | Admitting: Hematology and Oncology

## 2020-11-19 ENCOUNTER — Other Ambulatory Visit: Payer: Self-pay

## 2020-11-19 VITALS — BP 127/60 | HR 86 | Temp 97.3°F | Resp 18 | Wt 154.8 lb

## 2020-11-19 DIAGNOSIS — T451X5A Adverse effect of antineoplastic and immunosuppressive drugs, initial encounter: Secondary | ICD-10-CM

## 2020-11-19 DIAGNOSIS — Z5112 Encounter for antineoplastic immunotherapy: Secondary | ICD-10-CM | POA: Insufficient documentation

## 2020-11-19 DIAGNOSIS — C7801 Secondary malignant neoplasm of right lung: Secondary | ICD-10-CM | POA: Insufficient documentation

## 2020-11-19 DIAGNOSIS — C189 Malignant neoplasm of colon, unspecified: Secondary | ICD-10-CM | POA: Diagnosis not present

## 2020-11-19 DIAGNOSIS — C184 Malignant neoplasm of transverse colon: Secondary | ICD-10-CM | POA: Insufficient documentation

## 2020-11-19 DIAGNOSIS — R197 Diarrhea, unspecified: Secondary | ICD-10-CM | POA: Diagnosis not present

## 2020-11-19 DIAGNOSIS — C770 Secondary and unspecified malignant neoplasm of lymph nodes of head, face and neck: Secondary | ICD-10-CM | POA: Diagnosis not present

## 2020-11-19 DIAGNOSIS — K521 Toxic gastroenteritis and colitis: Secondary | ICD-10-CM

## 2020-11-19 DIAGNOSIS — C787 Secondary malignant neoplasm of liver and intrahepatic bile duct: Secondary | ICD-10-CM

## 2020-11-19 DIAGNOSIS — Z79899 Other long term (current) drug therapy: Secondary | ICD-10-CM | POA: Insufficient documentation

## 2020-11-19 DIAGNOSIS — Z5111 Encounter for antineoplastic chemotherapy: Secondary | ICD-10-CM | POA: Insufficient documentation

## 2020-11-19 DIAGNOSIS — C7802 Secondary malignant neoplasm of left lung: Secondary | ICD-10-CM | POA: Insufficient documentation

## 2020-11-19 DIAGNOSIS — Z9049 Acquired absence of other specified parts of digestive tract: Secondary | ICD-10-CM | POA: Insufficient documentation

## 2020-11-19 DIAGNOSIS — Z87891 Personal history of nicotine dependence: Secondary | ICD-10-CM | POA: Insufficient documentation

## 2020-11-19 DIAGNOSIS — Z95828 Presence of other vascular implants and grafts: Secondary | ICD-10-CM

## 2020-11-19 DIAGNOSIS — R109 Unspecified abdominal pain: Secondary | ICD-10-CM | POA: Insufficient documentation

## 2020-11-19 DIAGNOSIS — G893 Neoplasm related pain (acute) (chronic): Secondary | ICD-10-CM

## 2020-11-19 LAB — COMPREHENSIVE METABOLIC PANEL
ALT: 15 U/L (ref 0–44)
AST: 20 U/L (ref 15–41)
Albumin: 4.2 g/dL (ref 3.5–5.0)
Alkaline Phosphatase: 71 U/L (ref 38–126)
Anion gap: 8 (ref 5–15)
BUN: 13 mg/dL (ref 8–23)
CO2: 28 mmol/L (ref 22–32)
Calcium: 9.3 mg/dL (ref 8.9–10.3)
Chloride: 101 mmol/L (ref 98–111)
Creatinine, Ser: 0.68 mg/dL (ref 0.44–1.00)
GFR, Estimated: >60 mL/min (ref >60)
Glucose, Bld: 118 mg/dL — ABNORMAL HIGH (ref 70–99)
Potassium: 3.3 mmol/L — ABNORMAL LOW (ref 3.5–5.1)
Sodium: 137 mmol/L (ref 135–145)
Total Bilirubin: 0.4 mg/dL (ref 0.3–1.2)
Total Protein: 8.2 g/dL — ABNORMAL HIGH (ref 6.5–8.1)

## 2020-11-19 LAB — CBC WITH DIFFERENTIAL/PLATELET
Abs Immature Granulocytes: 0.05 10*3/uL (ref 0.00–0.07)
Basophils Absolute: 0 10*3/uL (ref 0.0–0.1)
Basophils Relative: 1 %
Eosinophils Absolute: 0.1 10*3/uL (ref 0.0–0.5)
Eosinophils Relative: 1 %
HCT: 41.4 % (ref 36.0–46.0)
Hemoglobin: 13.1 g/dL (ref 12.0–15.0)
Immature Granulocytes: 1 %
Lymphocytes Relative: 21 %
Lymphs Abs: 1.5 10*3/uL (ref 0.7–4.0)
MCH: 29.4 pg (ref 26.0–34.0)
MCHC: 31.6 g/dL (ref 30.0–36.0)
MCV: 92.8 fL (ref 80.0–100.0)
Monocytes Absolute: 0.6 10*3/uL (ref 0.1–1.0)
Monocytes Relative: 9 %
Neutro Abs: 4.7 10*3/uL (ref 1.7–7.7)
Neutrophils Relative %: 67 %
Platelets: 268 10*3/uL (ref 150–400)
RBC: 4.46 MIL/uL (ref 3.87–5.11)
RDW: 13 % (ref 11.5–15.5)
WBC: 7 10*3/uL (ref 4.0–10.5)
nRBC: 0 % (ref 0.0–0.2)

## 2020-11-19 LAB — MAGNESIUM: Magnesium: 2.2 mg/dL (ref 1.7–2.4)

## 2020-11-19 NOTE — Progress Notes (Signed)
[place], [place] [address]   CLINIC:  Medical Oncology/Hematology  PCP:  [facility], [place] / [place] [address] [phone]   REASON FOR VISIT:  Follow-up for transverse colon adenocarcinoma  PRIOR THERAPY:  1. Right hemicolectomy on 12/29/2013. 2. FOLFOX x 3 cycles from 02/10/2014 to 03/10/2014, stopped secondary to poor tolerance.  NGS Results: Not done  CURRENT THERAPY: FOLFIRI, Avastin and Aloxi every 2 weeks  BRIEF ONCOLOGIC HISTORY:  Oncology History  Adenocarcinoma of transverse colon ([place]) (Resolved)  12/09/2013 Initial Diagnosis   1. Colon, biopsy, proximal transverse mass INVASIVE ADENOCARCINOMA.   12/29/2013 Definitive Surgery   Colon, segmental resection for tumor, right - INVASIVE COLORECTAL ADENOCARCINOMA, 7 CM, EXTENDING INTO PERICOLONIC CONNECTIVE TISSUE. - METASTATIC CARCINOMA IN 6 OF 33 LYMPH NODES (6/33).   02/10/2014 - 03/10/2014 Adjuvant Chemotherapy   FOLFOX x 3 cycles   03/11/2014 Adverse Reaction   Patient called reporting diarrhea despite dose reduction and she wants to stop therapy.  Patient seen on 03/24/14 to discuss other treatment options and she stands by her decision to stop all therapy.   04/01/2014 Surgery   Port-a-cath removal by Dr. Arnoldo Morale.   01/12/2015 PET scan   Mild abdominal mesenteric LAD show hypermetabolic activity, 7 mm indeterminate pulm nodule in sup segment of RLL shows no assoc metabolic activity   [id number] PET scan   Mild progression of nodal mets in anterior mid abdominal mesentery, new nodal mets at L thoracic inlet. stable 6 mm nodule in posterior RLL, unchanged since 2015   06/22/2015 PET scan   Resolution of metabolic activity and decreased size of mild LAD at L thoracic inlet, stable mild mid abd hypermetabolic mesenteric LAD, stable 6 mm posterior LLL pulm nodule without metabolic activity   [id number] Imaging   MRI lumbar spine L4-L5 disc degenerated,  broad based disc hernation, B facet arthropathy with hypertophy and edema, stenosis of lateral recesses that could cause neural compression   10/18/2016 Imaging   CT CAP- 1. Several small ground-glass pulmonary nodules are present in the lungs and are stable over the past 9 months, but several are mildly larger than they were in 2015. Low-grade adenocarcinoma can sometimes have this appearance and surveillance is likely warranted. 2. There is a cluster of nodal tissue in the central mesentery, maximum short axis diameter 1.3 cm. This nodal cluster is relatively low in density and not appreciably changed from the prior exam. Given the lack of change is may represent quiescent residua of malignancy, but again, surveillance is likely warranted. 3. The left-sided rib fractures are more numerous than was revealed on the prior radiographs ; there are nondisplaced healing fractures of the left fifth, sixth, seventh, eighth, ninth, and tenth ribs. 4. Mild cardiomegaly although part of this appearance may be due to pectus excavatum. 5. Several additional small pulmonary nodules are stable from 2015 and considered benign. 6. Right hemicolectomy and sigmoid colon diverticulosis. 7. Small right paraumbilical hernia containing adipose tissue. 8. Lower lumbar spondylosis and degenerative disc disease potentially with mild impingement at L4-5.   04/25/2017 Imaging   CT C/A/P: Scattered solid pulmonary nodules measure up to 6 mm in the right lower lobe, unchanged. There are a few scattered ground-glass nodules measuring up to 8 mm in the right upper lobe, also stable. Distal perigastric lymph nodes are stable. No additional evidence of metastatic disease.   Metastatic colon cancer to liver ([place])  11/04/2020 Initial Diagnosis   Metastatic colon  cancer to liver [facility])   11/10/2020 -  Chemotherapy    Patient is on Treatment Plan: COLORECTAL FOLFIRI / BEVACIZUMAB Q14D        CANCER STAGING: Cancer  Staging No matching staging information was found for the patient.  INTERVAL HISTORY:  [name], a 75 y.o. female, returns for routine follow-up and consideration after cycle 2 of chemotherapy. Patient is here for follow-up with her husband.  She has tolerated last chemo well except for a few days of fatigue.  She also has had some mild diarrhea, about 4 bowel movements a day, has been taking about 3 doses of Imodium again.  Otherwise she denies any mucositis, cold hypersensitivity or neuropathy today.  She denies any frothy urine or episodes of high blood pressure.  Pain in the abdomen has improved since her last chemotherapy.  REVIEW OF SYSTEMS:  Review of Systems  Constitutional: Positive for appetite change (75%) and fatigue (50%).  Respiratory: Negative for shortness of breath (w/ exertion).   Cardiovascular: Positive for palpitations (w/ exertion). Negative for leg swelling.  Gastrointestinal: Positive for diarrhea (on Imodium BID) and nausea. Negative for abdominal pain.  Musculoskeletal: Negative for arthralgias and myalgias.  Neurological: Negative for numbness.  Psychiatric/Behavioral: Negative for depression and sleep disturbance (d/t pain). The patient is not nervous/anxious.   All other systems reviewed and are negative.   PAST MEDICAL/SURGICAL HISTORY:  Past Medical History:  Diagnosis Date  . Arthritis    wrist and thumbs  . Colon cancer ([place])   . PONV (postoperative nausea and vomiting)   . Port-A-Cath in place 11/09/2020  . Ruptured lumbar disc    L1-L5 per patient report  . Sciatica of left side    Past Surgical History:  Procedure Laterality Date  . COLONOSCOPY N/A 12/12/2013   Procedure: COLONOSCOPY;  Surgeon: Rogene Houston, MD;  [facility];  Service: Endoscopy;  Laterality: N/A;  730  . COLONOSCOPY N/A 01/20/2015   Procedure: COLONOSCOPY;  Surgeon: Rogene Houston, MD;  [facility]: [facility];  Service: Endoscopy;  Laterality: N/A;  1030  .  COLONOSCOPY N/A 03/28/2018   Procedure: COLONOSCOPY;  Surgeon: Rogene Houston, MD;  [facility]: [facility];  Service: Endoscopy;  Laterality: N/A;  1015  . EUS N/A 12/18/2013   Procedure: UPPER ENDOSCOPIC ULTRASOUND (EUS) LINEAR;  Surgeon: Milus Banister, MD;  Location: [facility];  Service: Endoscopy;  Laterality: N/A;  . herniated disc     [id number]  . IR IMAGING GUIDED PORT INSERTION  10/14/2020  . IR US GUIDANCE  10/14/2020  . PARTIAL COLECTOMY N/A 12/29/2013   Procedure: RIGHT HEMICOLECTOMY;  Surgeon: Jamesetta So, MD;  Location: [facility];  Service: General;  Laterality: N/A;  . POLYPECTOMY  03/28/2018   Procedure: POLYPECTOMY;  Surgeon: Rogene Houston, MD;  [facility];  Service: Endoscopy;;  transverse colon (hot snare);  Marland Kitchen PORT-A-CATH REMOVAL Right 04/01/2014   Procedure: MINOR REMOVAL PORT-A-CATH;  Surgeon: Jamesetta So, MD;  Location: [facility];  Service: General;  Laterality: Right;  . PORTACATH PLACEMENT Right 02/04/2014   Procedure: INSERTION PORT-A-CATH;  Surgeon: Jamesetta So, MD;  Location: [facility];  Service: General;  Laterality: Right;    SOCIAL HISTORY:  Social History   Socioeconomic History  . Marital status: Married    Spouse name: Not on file  . Number of children: Not on file  . Years of education: Not on file  . Highest education level: Not  on file  Occupational History  . Not on file  Tobacco Use  . Smoking status: Former Smoker    Packs/day: 0.25    Years: 5.00    Pack years: 1.25    Types: Cigarettes    Quit date: 04/19/1959    Years since quitting: 61.6  . Smokeless tobacco: Never Used  . Tobacco comment: smoked for 5 years as teenager  Vaping Use  . Vaping Use: Never used  Substance and Sexual Activity  . Alcohol use: No  . Drug use: No  . Sexual activity: Yes    Birth control/protection: Post-menopausal  Other Topics Concern  . Not on file  Social History Narrative  . Not on file   Social Determinants of Health   Financial  Resource Strain: Low Risk   . Difficulty of Paying Living Expenses: Not hard at all  Food Insecurity: No Food Insecurity  . Worried About Charity fundraiser in the Last Year: Never true  . Ran Out of Food in the Last Year: Never true  Transportation Needs: No Transportation Needs  . Lack of Transportation (Medical): No  . Lack of Transportation (Non-Medical): No  Physical Activity: Inactive  . Days of Exercise per Week: 0 days  . Minutes of Exercise per Session: 0 min  Stress: Stress Concern Present  . Feeling of Stress : To some extent  Social Connections: Socially Integrated  . Frequency of Communication with Friends and Family: More than three times a week  . Frequency of Social Gatherings with Friends and Family: More than three times a week  . Attends Religious Services: More than 4 times per year  . Active Member of Clubs or Organizations: Yes  . Attends Archivist Meetings: More than 4 times per year  . Marital Status: Married  Human resources officer Violence: Not At Risk  . Fear of Current or Ex-Partner: No  . Emotionally Abused: No  . Physically Abused: No  . Sexually Abused: No    FAMILY HISTORY:  Family History  Problem Relation Age of Onset  . Diabetes type II Mother   . Dementia Father     CURRENT MEDICATIONS:  Current Outpatient Medications  Medication Sig Dispense Refill  . acetaminophen (TYLENOL) 500 MG tablet Take 500 mg by mouth every 6 (six) hours as needed for moderate pain or headache.    Marland Kitchen aspirin 81 MG EC tablet Take 1 tablet (81 mg total) by mouth daily. Swallow whole. 30 tablet 12  . Bioflavonoid Products (ESTER C PO) Take 1,000 mg by mouth daily.    . Cholecalciferol (D3-1000) 25 MCG (1000 UT) capsule Take 125 Units by mouth daily.    . Cranberry-Vitamin C-Vitamin E 4200-20-3 MG-MG-UNIT CAPS Take 500 mg by mouth daily.    . Garlic 123XX123 MG CAPS Take 1,000 mg by mouth daily.     Marland Kitchen loperamide (IMODIUM A-D) 2 MG tablet Take 2 mg by mouth 4 (four)  times daily as needed for diarrhea or loose stools.    Marland Kitchen loperamide (IMODIUM A-D) 2 MG tablet Take 1 tablet (2 mg total) by mouth as needed. Take 2 at diarrhea onset, then 1 tablet after each loose stool.  DO NOT exceed 8 tablets in a 24 hour period. May take 2 every 4hrs at night. If diarrhea recurs repeat. 100 tablet 1  . Multiple Vitamin (MULTIVITAMIN) tablet Take 1 tablet by mouth daily.    . Omega-3 Fatty Acids (FISH OIL) 1200 MG CAPS Take 1,000 mg by mouth  daily.    . lidocaine-prilocaine (EMLA) cream Apply small amount to port a cath site and cover with plastic wrap 1 hour prior to infusion appointments (Patient not taking: Reported on 11/19/2020) 30 g 3  . prochlorperazine (COMPAZINE) 10 MG tablet Take 1 tablet (10 mg total) by mouth every 6 (six) hours as needed (NAUSEA). (Patient not taking: Reported on 11/19/2020) 30 tablet 1   No current facility-administered medications for this visit.    ALLERGIES:  Allergies  Allergen Reactions  . Sulfa Antibiotics Other (See Comments)    "Sores in my mouth"    PHYSICAL EXAM:  Performance status (ECOG): 1 - Symptomatic but completely ambulatory  Vitals:   11/19/20 1007  BP: 127/60  Pulse: 86  Resp: 18  Temp: (!) 97.3 F (36.3 C)  SpO2: 100%   Wt Readings from Last 3 Encounters:  11/19/20 154 lb 12.8 oz (70.2 kg)  11/10/20 153 lb (69.4 kg)  11/09/20 155 lb (70.3 kg)   Physical Exam Vitals reviewed.  Constitutional:      Appearance: Normal appearance.  Cardiovascular:     Rate and Rhythm: Normal rate and regular rhythm.     Pulses: Normal pulses.     Heart sounds: Normal heart sounds.  Pulmonary:     Effort: Pulmonary effort is normal.     Breath sounds: Normal breath sounds.  Chest:  Breasts:     Right: No supraclavicular adenopathy.     Left: No supraclavicular adenopathy.      Comments: Port-a-Cath in R chest Abdominal:     Palpations: Abdomen is soft. There is no mass.     Tenderness: There is no abdominal  tenderness.  Musculoskeletal:     Right lower leg: No edema.     Left lower leg: No edema.  Lymphadenopathy:     Cervical: No cervical adenopathy.     Upper Body:     Right upper body: No supraclavicular adenopathy.     Left upper body: No supraclavicular adenopathy.  Neurological:     General: No focal deficit present.     Mental Status: She is alert and oriented to person, place, and time.  Psychiatric:        Mood and Affect: Mood normal.        Behavior: Behavior normal.     LABORATORY DATA:  I have reviewed the labs as listed.  CBC Latest Ref Rng & Units 11/19/2020 11/10/2020 11/09/2020  WBC 4.0 - 10.5 K/uL 7.0 6.7 8.6  Hemoglobin 12.0 - 15.0 g/dL 13.1 13.2 13.0  Hematocrit 36.0 - 46.0 % 41.4 41.4 41.2  Platelets 150 - 400 K/uL 268 226 243   CMP Latest Ref Rng & Units 11/19/2020 11/10/2020 09/28/2020  Glucose 70 - 99 mg/dL 118(H) 154(H) 109(H)  BUN 8 - 23 mg/dL 13 11 10   Creatinine 0.44 - 1.00 mg/dL 0.68 0.72 0.52  Sodium 135 - 145 mmol/L 137 136 141  Potassium 3.5 - 5.1 mmol/L 3.3(L) 3.6 4.0  Chloride 98 - 111 mmol/L 101 101 105  CO2 22 - 32 mmol/L 28 27 25   Calcium 8.9 - 10.3 mg/dL 9.3 9.6 9.6  Total Protein 6.5 - 8.1 g/dL 8.2(H) 7.9 8.2(H)  Total Bilirubin 0.3 - 1.2 mg/dL 0.4 0.6 0.8  Alkaline Phos 38 - 126 U/L 71 70 76  AST 15 - 41 U/L 20 24 22   ALT 0 - 44 U/L 15 16 16    Lab Results  Component Value Date   CEA1 21.4 (H) 09/17/2020  CEA1 4.1 05/01/2019   CEA1 4.6 10/28/2018    DIAGNOSTIC IMAGING:  I have independently reviewed the scans and discussed with the patient. NM PET Image Restag (PS) Skull Base To Thigh  Result Date: 11/02/2020 CLINICAL DATA:  Subsequent treatment strategy for colon cancer with metastatic disease. EXAM: NUCLEAR MEDICINE PET SKULL BASE TO THIGH TECHNIQUE: 7.26 mCi F-18 FDG was injected intravenously. Full-ring PET imaging was performed from the skull base to thigh after the radiotracer. CT data was obtained and used for attenuation  correction and anatomic localization. Fasting blood glucose: 99 mg/dl COMPARISON:  Multiple prior studies, most recent exam from March of 2022. FINDINGS: Mediastinal blood pool activity: SUV max 2.8 Liver activity: SUV max NA NECK: LEFT thoracic inlet lymph node with enlargement without significant corresponding FDG uptake. Maximum SUV of 3.1. Node measuring 2.2 cm short axis similar to previous imaging from September 27, 2020 No additional signs of nodal enlargement in the neck. Incidental CT findings: none CHEST: Enlarged lymph nodes in the chest, further enlargement of lymph nodes along the RIGHT paratracheal chain since the previous study. 95/3 1.8 cm RIGHT paratracheal lymph node previously less than a cm with a maximum SUV of 3.4. 82/3 a 1 cm lymph node in the superior mediastinum between the LEFT carotid and brachiocephalic origins with a maximum SUV of 5.4 Precarinal lymph node on image 99/3 approximately 1 cm, previously less than a cm with a maximum SUV of 3.9 Activity in the RIGHT hilum with suspected lymph node in this location, similar FDG uptake to mediastinal lymph nodes. Bulky retrocrural lymph node 2.5 cm with moderate FDG uptake approximately 4.04 maximum SUV. Similar uptake tracking into upper abdominal lymph nodes. Numerous bilateral pulmonary nodules which have enlarged since the previous study. Also multiple new pulmonary nodules are evident most less than a cm. Is Other nodules that were present previously show similar enlargement. RIGHT upper lobe pulmonary nodule (image 77, series 3) 11 mm, previously 8 mm. Image 110/3 1.3 cm RIGHT lower lobe pulmonary nodule with a maximum SUV of 1.6. This previously measured approximately 0.7 cm. Incidental CT findings: RIGHT-sided Port-A-Cath in place. Heart size stable. No substantial pericardial effusion. Aorta is normal caliber. Central pulmonary vessels normal caliber. Limited assessment of cardiovascular structures given lack of intravenous contrast.  ABDOMEN/PELVIS: Bulky mesenteric mass (image 194, series 3) 5.0 x 4.1 cm. The dominant portion of the mass, central portion shows limited FDG uptake. Associated mesenteric lymph nodes on image 206 of series 3 with intense metabolic activity maximum SUV in this area of approximately 9.3. Lymph nodes have enlarged within the small bowel mesentery since the previous study. For example on image 209 of series 3 a 2 mm lymph node previously measured approximately 7 mm. Other lymph nodes in this area have enlarged to a similar extent compared to recent imaging. Retroperitoneal adenopathy with similar size 2.1 cm lymph node on 186/3. LEFT periaortic lymph node on 186/3 2 cm. These are unchanged and with moderate FDG uptake Focal hepatic lesion increased in size 2.6 x 2.1 as compared to 1.4 x 0.9 cm with maximum SUV of 4.7. Subtle area of hypodensity in the dome of the RIGHT hemi liver on image 145 of series 3 suggested but not confirmed. Mildly heterogeneous uptake throughout the liver without additional discrete focus of increased metabolic activity Incidental CT findings: No acute findings related to liver, gallbladder, spleen, pancreas and adrenal glands. Smooth contour the kidneys without hydronephrosis or perinephric stranding. Postoperative changes related to partial colectomy. No  ascites. Uterus and adnexal structures grossly normal. SKELETON: No focal hypermetabolic activity to suggest skeletal metastasis. Incidental CT findings: none IMPRESSION: Interval worsening of disease since the prior study with enlarging lymph nodes in the chest and abdomen as described. Increasing number and size of pulmonary nodules. Areas that were largest on the prior study and showed low attenuation are little changed and show limited FDG uptake. Areas that have developed and or enlarged in the interval show moderate FDG uptake with most pronounced uptake in lymph nodes adjacent to the dominant mesenteric mass in the small bowel  mesentery. Increased size of dominant hepatic lesion. Questionable area developing in the dome of the RIGHT hemi liver. Attention on follow-up. Electronically Signed   By: Zetta Bills M.D.   On: 11/02/2020 12:19   US Abdomen Limited  Result Date: 11/10/2020 INDICATION: 75 year old female with a history of mucinous adenocarcinoma of the colon with imaging evidence concerning for multifocal metastatic disease. However, prior biopsies tend to yield a majority of mucinous tissue and only rare cancer cells. Patient is undergone prior biopsy of 2 sites of nodal metastatic disease which has not yielded sufficient cellular material to allow for advanced molecular testing. She presents for biopsy of a hypermetabolic lesion in the liver in an effort to obtain adequate tissue for molecular testing. EXAM: CT-guided biopsy of liver lesion. MEDICATIONS: None. ANESTHESIA/SEDATION: Moderate (conscious) sedation was employed during this procedure. A total of Versed mg and Fentanyl mcg was administered intravenously. Moderate Sedation Time: minutes. The patient's level of consciousness and vital signs were monitored continuously by radiology nursing throughout the procedure under my direct supervision. FLUOROSCOPY TIME:  Fluoroscopy Time:  minutes  seconds ( mGy). COMPLICATIONS: None immediate. PROCEDURE: Informed written consent was obtained from the patient after a thorough discussion of the procedural risks, benefits and alternatives. All questions were addressed. Maximal Sterile Barrier Technique was utilized including caps, mask, sterile gowns, sterile gloves, sterile drape, hand hygiene and skin antiseptic. A timeout was performed prior to the initiation of the procedure. Initially, the abdomen was interrogated with ultrasound. A very subtle isoechoic lesion is identified in the region of the known hypermetabolic hepatic mass. The overlying skin was sterilely prepped and draped in the standard fashion with chlorhexidine  skin prep. Local anesthesia was attained by infiltration with 1% lidocaine. A small dermatotomy was made. Next, under real-time ultrasound guidance, an attempt was made to advance a 17 gauge introducer needle into the margin of the mass. However, as the patient became sedated and her respirations more shallow, the liver retracted upward under the ribs and adequate visualization could not be achieved. Therefore, the procedure was stopped and the patient transported to CT to attempt biopsy under CT imaging guidance. A planning axial CT scan was performed after the patient was successfully positioned supine on the CT gantry. The hypoechoic 2.7 x 2.0 cm lesion is easily identified. A suitable skin entry site was selected and marked. The overlying skin was again sterilely prepped and draped with chlorhexidine in the standard fashion. Local anesthesia was again attained by infiltration with 1% lidocaine. A second small dermatotomy was made. Under intermittent CT guidance, a 17 gauge introducer needle was carefully advanced into the mass. CT imaging confirms presence of the needle within the mass. Multiple 18 gauge core biopsies were then obtained coaxially using the bio Pince automated biopsy device. 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 Best obtainable specimens were collected. Gel-Foam embolization of the biopsy tract  as the introducer needle was removed. Post biopsy CT imaging demonstrates no evidence of immediate complication. IMPRESSION: 1. Aborted attempted ultrasound-guided biopsy secondary to poor visualization of the mass. 2. Successful CT-guided biopsy of hypermetabolic liver lesion. Of note, biopsy specimens are highly gelatinous consistent with the known clinical history of mucinous adenocarcinoma. Numerous biopsies from the center and margin of the lesion were obtained. These are the best obtainable specimens. Further percutaneous sampling is  unlikely to yield any additional diagnostic material. Electronically Signed   By: Jacqulynn Cadet M.D.   On: 11/10/2020 09:21   CT LIVER MASS BIOPSY  Result Date: 11/10/2020 INDICATION: 75 year old female with a history of mucinous adenocarcinoma of the colon with imaging evidence concerning for multifocal metastatic disease. However, prior biopsies tend to yield a majority of mucinous tissue and only rare cancer cells. Patient is undergone prior biopsy of 2 sites of nodal metastatic disease which has not yielded sufficient cellular material to allow for advanced molecular testing. She presents for biopsy of a hypermetabolic lesion in the liver in an effort to obtain adequate tissue for molecular testing. EXAM: CT-guided biopsy of liver lesion. MEDICATIONS: None. ANESTHESIA/SEDATION: Moderate (conscious) sedation was employed during this procedure. A total of Versed mg and Fentanyl mcg was administered intravenously. Moderate Sedation Time: minutes. The patient's level of consciousness and vital signs were monitored continuously by radiology nursing throughout the procedure under my direct supervision. FLUOROSCOPY TIME:  Fluoroscopy Time:  minutes  seconds ( mGy). COMPLICATIONS: None immediate. PROCEDURE: Informed written consent was obtained from the patient after a thorough discussion of the procedural risks, benefits and alternatives. All questions were addressed. Maximal Sterile Barrier Technique was utilized including caps, mask, sterile gowns, sterile gloves, sterile drape, hand hygiene and skin antiseptic. A timeout was performed prior to the initiation of the procedure. Initially, the abdomen was interrogated with ultrasound. A very subtle isoechoic lesion is identified in the region of the known hypermetabolic hepatic mass. The overlying skin was sterilely prepped and draped in the standard fashion with chlorhexidine skin prep. Local anesthesia was attained by infiltration with 1% lidocaine. A small  dermatotomy was made. Next, under real-time ultrasound guidance, an attempt was made to advance a 17 gauge introducer needle into the margin of the mass. However, as the patient became sedated and her respirations more shallow, the liver retracted upward under the ribs and adequate visualization could not be achieved. Therefore, the procedure was stopped and the patient transported to CT to attempt biopsy under CT imaging guidance. A planning axial CT scan was performed after the patient was successfully positioned supine on the CT gantry. The hypoechoic 2.7 x 2.0 cm lesion is easily identified. A suitable skin entry site was selected and marked. The overlying skin was again sterilely prepped and draped with chlorhexidine in the standard fashion. Local anesthesia was again attained by infiltration with 1% lidocaine. A second small dermatotomy was made. Under intermittent CT guidance, a 17 gauge introducer needle was carefully advanced into the mass. CT imaging confirms presence of the needle within the mass. Multiple 18 gauge core biopsies were then obtained coaxially using the bio Pince automated biopsy device. 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 Best obtainable specimens were collected. Gel-Foam embolization of the biopsy tract as the introducer needle was removed. Post biopsy CT imaging demonstrates no evidence of immediate complication. IMPRESSION: 1. Aborted attempted ultrasound-guided biopsy secondary to poor visualization of the mass. 2. Successful CT-guided biopsy of hypermetabolic liver  lesion. Of note, biopsy specimens are highly gelatinous consistent with the known clinical history of mucinous adenocarcinoma. Numerous biopsies from the center and margin of the lesion were obtained. These are the best obtainable specimens. Further percutaneous sampling is unlikely to yield any additional diagnostic material. Electronically Signed   By:  Jacqulynn Cadet M.D.   On: 11/10/2020 09:21     ASSESSMENT/PLAN:  1.Metastatic colon cancer to the liver, lungs and lymph nodes: -She is currently on FOLFIRI and Avastin, reduced dose because of poor tolerance in the past.  She has been tolerating the current dose very well. -No major adverse effects except for some grade 1 diarrhea and grade 1 fatigue.   -Okay to continue with current dose of chemotherapy for anticipated cycle 2 of treatment.  2.Diarrhea: Grade 1, needing Imodium about twice a day.  3.  Abdominal pain: Since her last cycle of chemotherapy, she has noticed that her abdominal pain has gotten better and she is very thankful for this. We will continue current regimen and plan to reassess response after about 4 cycles of chemo.   Orders placed this encounter:  No orders of the defined types were placed in this encounter.  Benay Pike MD

## 2020-11-20 ENCOUNTER — Encounter (HOSPITAL_COMMUNITY): Payer: Self-pay | Admitting: Hematology and Oncology

## 2020-11-23 ENCOUNTER — Other Ambulatory Visit (HOSPITAL_COMMUNITY): Payer: Self-pay | Admitting: Surgery

## 2020-11-23 DIAGNOSIS — K521 Toxic gastroenteritis and colitis: Secondary | ICD-10-CM

## 2020-11-23 DIAGNOSIS — Z5111 Encounter for antineoplastic chemotherapy: Secondary | ICD-10-CM

## 2020-11-23 DIAGNOSIS — T451X5A Adverse effect of antineoplastic and immunosuppressive drugs, initial encounter: Secondary | ICD-10-CM

## 2020-11-23 DIAGNOSIS — C787 Secondary malignant neoplasm of liver and intrahepatic bile duct: Secondary | ICD-10-CM

## 2020-11-23 DIAGNOSIS — C189 Malignant neoplasm of colon, unspecified: Secondary | ICD-10-CM

## 2020-11-23 NOTE — Progress Notes (Signed)
[place], [place] [address]   CLINIC:  Medical Oncology/Hematology  PCP:  [facility] [employer] / [place] [address] [phone]   REASON FOR VISIT:  Follow-up for transverse colon adenocarcinoma  PRIOR THERAPY:  1.Right hemicolectomy on 12/29/2013. 2. FOLFOX x 3 cycles from 02/10/2014 to 03/10/2014, stopped secondary to poor tolerance.  NGS Results: not done  CURRENT THERAPY:  FOLFIRI, Avastin and Aloxi every 2 weeks  BRIEF ONCOLOGIC HISTORY:  Oncology History  Adenocarcinoma of transverse colon ([place]) (Resolved)  12/09/2013 Initial Diagnosis   1. Colon, biopsy, proximal transverse mass INVASIVE ADENOCARCINOMA.   12/29/2013 Definitive Surgery   Colon, segmental resection for tumor, right - INVASIVE COLORECTAL ADENOCARCINOMA, 7 CM, EXTENDING INTO PERICOLONIC CONNECTIVE TISSUE. - METASTATIC CARCINOMA IN 6 OF 33 LYMPH NODES (6/33).   02/10/2014 - 03/10/2014 Adjuvant Chemotherapy   FOLFOX x 3 cycles   03/11/2014 Adverse Reaction   Patient called reporting diarrhea despite dose reduction and she wants to stop therapy.  Patient seen on 03/24/14 to discuss other treatment options and she stands by her decision to stop all therapy.   04/01/2014 Surgery   Port-a-cath removal by Dr. Arnoldo Morale.   01/12/2015 PET scan   Mild abdominal mesenteric LAD show hypermetabolic activity, 7 mm indeterminate pulm nodule in sup segment of RLL shows no assoc metabolic activity   12/21/4313 PET scan   Mild progression of nodal mets in anterior mid abdominal mesentery, new nodal mets at L thoracic inlet. stable 6 mm nodule in posterior RLL, unchanged since 2015   06/22/2015 PET scan   Resolution of metabolic activity and decreased size of mild LAD at L thoracic inlet, stable mild mid abd hypermetabolic mesenteric LAD, stable 6 mm posterior LLL pulm nodule without metabolic activity   4/00/8676 Imaging   MRI lumbar spine L4-L5 disc degenerated,  broad based disc hernation, B facet arthropathy with hypertophy and edema, stenosis of lateral recesses that could cause neural compression   10/18/2016 Imaging   CT CAP- 1. Several small ground-glass pulmonary nodules are present in the lungs and are stable over the past 9 months, but several are mildly larger than they were in 2015. Low-grade adenocarcinoma can sometimes have this appearance and surveillance is likely warranted. 2. There is a cluster of nodal tissue in the central mesentery, maximum short axis diameter 1.3 cm. This nodal cluster is relatively low in density and not appreciably changed from the prior exam. Given the lack of change is may represent quiescent residua of malignancy, but again, surveillance is likely warranted. 3. The left-sided rib fractures are more numerous than was revealed on the prior radiographs ; there are nondisplaced healing fractures of the left fifth, sixth, seventh, eighth, ninth, and tenth ribs. 4. Mild cardiomegaly although part of this appearance may be due to pectus excavatum. 5. Several additional small pulmonary nodules are stable from 2015 and considered benign. 6. Right hemicolectomy and sigmoid colon diverticulosis. 7. Small right paraumbilical hernia containing adipose tissue. 8. Lower lumbar spondylosis and degenerative disc disease potentially with mild impingement at L4-5.   04/25/2017 Imaging   CT C/A/P: Scattered solid pulmonary nodules measure up to 6 mm in the right lower lobe, unchanged. There are a few scattered ground-glass nodules measuring up to 8 mm in the right upper lobe, also stable. Distal perigastric lymph nodes are stable. No additional evidence of metastatic disease.   Metastatic colon cancer to liver ([place])  11/04/2020 Initial Diagnosis   Metastatic colon  cancer to liver [facility])   11/10/2020 -  Chemotherapy    Patient is on Treatment Plan: COLORECTAL FOLFIRI / BEVACIZUMAB Q14D        CANCER STAGING: Cancer  Staging No matching staging information was found for the patient.  INTERVAL HISTORY:  [name], a 75 y.o. female, returns for routine follow-up and consideration for next cycle of chemotherapy. Amanda Norris was last seen on 11/10/2020.  Due for cycle #2 of FOLFIRI / BEVACIZUMAB  today.   Overall, she tells me she has been feeling pretty well. She reports decreased energy levels for the 2 weeks following last Tx. She is drinking 1 can of Boost/Ensure daily. She denies abdominal cramping, but reports diarrhea which is being successfully treated with imodium (3-4 pills daily); the diarrhea has been a chronic issue prior to chemo. She denies nausea or vomiting; but reports slightly decreased appetite due to fatigue from chewing. She reports that the abdominal pain she had previously felt after Tx has gone away. She denies any soreness in her mouth.  Overall, she feels ready for next cycle of chemo today.    REVIEW OF SYSTEMS:  Review of Systems  Constitutional: Positive for appetite change (50%) and fatigue (50%).  HENT:   Positive for trouble swallowing (trouble chewing d/t fatigue).   Gastrointestinal: Positive for diarrhea (treated with imodium). Negative for abdominal pain, nausea and vomiting.  All other systems reviewed and are negative.   PAST MEDICAL/SURGICAL HISTORY:  Past Medical History:  Diagnosis Date  . Arthritis    wrist and thumbs  . Colon cancer ([place])   . PONV (postoperative nausea and vomiting)   . Port-A-Cath in place 11/09/2020  . Ruptured lumbar disc    L1-L5 per patient report  . Sciatica of left side    Past Surgical History:  Procedure Laterality Date  . COLONOSCOPY N/A 12/12/2013   Procedure: COLONOSCOPY;  Surgeon: Rogene Houston, MD;  [facility];  Service: Endoscopy;  Laterality: N/A;  730  . COLONOSCOPY N/A 01/20/2015   Procedure: COLONOSCOPY;  Surgeon: Rogene Houston, MD;  [facility];  Service: Endoscopy;  Laterality: N/A;  1030  .  COLONOSCOPY N/A 03/28/2018   Procedure: COLONOSCOPY;  Surgeon: Rogene Houston, MD;  [facility];  Service: Endoscopy;  Laterality: N/A;  1015  . EUS N/A 12/18/2013   Procedure: UPPER ENDOSCOPIC ULTRASOUND (EUS) LINEAR;  Surgeon: Milus Banister, MD;  Location: [facility];  Service: Endoscopy;  Laterality: N/A;  . herniated disc     [id number]  . IR IMAGING GUIDED PORT INSERTION  10/14/2020  . IR US GUIDANCE  10/14/2020  . PARTIAL COLECTOMY N/A 12/29/2013   Procedure: RIGHT HEMICOLECTOMY;  Surgeon: Jamesetta So, MD;  Location: [facility];  Service: General;  Laterality: N/A;  . POLYPECTOMY  03/28/2018   Procedure: POLYPECTOMY;  Surgeon: Rogene Houston, MD;  [facility];  Service: Endoscopy;;  transverse colon (hot snare);  Marland Kitchen PORT-A-CATH REMOVAL Right 04/01/2014   Procedure: MINOR REMOVAL PORT-A-CATH;  Surgeon: Jamesetta So, MD;  Location: [facility];  Service: General;  Laterality: Right;  . PORTACATH PLACEMENT Right 02/04/2014   Procedure: INSERTION PORT-A-CATH;  Surgeon: Jamesetta So, MD;  Location: [facility];  Service: General;  Laterality: Right;    SOCIAL HISTORY:  Social History   Socioeconomic History  . Marital status: Married    Spouse name: Not on file  . Number of children: Not on file  .  Years of education: Not on file  . Highest education level: Not on file  Occupational History  . Not on file  Tobacco Use  . Smoking status: Former Smoker    Packs/day: 0.25    Years: 5.00    Pack years: 1.25    Types: Cigarettes    Quit date: 04/19/1959    Years since quitting: 61.6  . Smokeless tobacco: Never Used  . Tobacco comment: smoked for 5 years as teenager  Vaping Use  . Vaping Use: Never used  Substance and Sexual Activity  . Alcohol use: No  . Drug use: No  . Sexual activity: Yes    Birth control/protection: Post-menopausal  Other Topics Concern  . Not on file  Social History Narrative  . Not on file   Social Determinants of Health   Financial  Resource Strain: Low Risk   . Difficulty of Paying Living Expenses: Not hard at all  Food Insecurity: No Food Insecurity  . Worried About Charity fundraiser in the Last Year: Never true  . Ran Out of Food in the Last Year: Never true  Transportation Needs: No Transportation Needs  . Lack of Transportation (Medical): No  . Lack of Transportation (Non-Medical): No  Physical Activity: Inactive  . Days of Exercise per Week: 0 days  . Minutes of Exercise per Session: 0 min  Stress: Stress Concern Present  . Feeling of Stress : To some extent  Social Connections: Socially Integrated  . Frequency of Communication with Friends and Family: More than three times a week  . Frequency of Social Gatherings with Friends and Family: More than three times a week  . Attends Religious Services: More than 4 times per year  . Active Member of Clubs or Organizations: Yes  . Attends Archivist Meetings: More than 4 times per year  . Marital Status: Married  Human resources officer Violence: Not At Risk  . Fear of Current or Ex-Partner: No  . Emotionally Abused: No  . Physically Abused: No  . Sexually Abused: No    FAMILY HISTORY:  Family History  Problem Relation Age of Onset  . Diabetes type II Mother   . Dementia Father     CURRENT MEDICATIONS:  Current Outpatient Medications  Medication Sig Dispense Refill  . acetaminophen (TYLENOL) 500 MG tablet Take 500 mg by mouth every 6 (six) hours as needed for moderate pain or headache.    Marland Kitchen aspirin 81 MG EC tablet Take 1 tablet (81 mg total) by mouth daily. Swallow whole. 30 tablet 12  . Bioflavonoid Products (ESTER C PO) Take 1,000 mg by mouth daily.    . Cholecalciferol (D3-1000) 25 MCG (1000 UT) capsule Take 125 Units by mouth daily.    . Cranberry-Vitamin C-Vitamin E 4200-20-3 MG-MG-UNIT CAPS Take 500 mg by mouth daily.    . Garlic 123XX123 MG CAPS Take 1,000 mg by mouth daily.     Marland Kitchen lidocaine-prilocaine (EMLA) cream Apply small amount to port a  cath site and cover with plastic wrap 1 hour prior to infusion appointments (Patient not taking: Reported on 11/19/2020) 30 g 3  . loperamide (IMODIUM A-D) 2 MG tablet Take 2 mg by mouth 4 (four) times daily as needed for diarrhea or loose stools.    Marland Kitchen loperamide (IMODIUM A-D) 2 MG tablet Take 1 tablet (2 mg total) by mouth as needed. Take 2 at diarrhea onset, then 1 tablet after each loose stool.  DO NOT exceed 8 tablets in a 24  hour period. May take 2 every 4hrs at night. If diarrhea recurs repeat. 100 tablet 1  . Multiple Vitamin (MULTIVITAMIN) tablet Take 1 tablet by mouth daily.    . Omega-3 Fatty Acids (FISH OIL) 1200 MG CAPS Take 1,000 mg by mouth daily.    . prochlorperazine (COMPAZINE) 10 MG tablet Take 1 tablet (10 mg total) by mouth every 6 (six) hours as needed (NAUSEA). (Patient not taking: Reported on 11/19/2020) 30 tablet 1   No current facility-administered medications for this visit.    ALLERGIES:  Allergies  Allergen Reactions  . Sulfa Antibiotics Other (See Comments)    "Sores in my mouth"    PHYSICAL EXAM:  Performance status (ECOG): 1 - Symptomatic but completely ambulatory  There were no vitals filed for this visit. Wt Readings from Last 3 Encounters:  11/19/20 154 lb 12.8 oz (70.2 kg)  11/10/20 153 lb (69.4 kg)  11/09/20 155 lb (70.3 kg)   Physical Exam Vitals reviewed.  Constitutional:      Appearance: Normal appearance.  Cardiovascular:     Rate and Rhythm: Normal rate and regular rhythm.     Pulses: Normal pulses.     Heart sounds: Normal heart sounds.  Pulmonary:     Effort: Pulmonary effort is normal.     Breath sounds: Normal breath sounds.  Abdominal:     Palpations: Abdomen is soft. There is no hepatomegaly, splenomegaly or mass.     Tenderness: There is no abdominal tenderness.  Musculoskeletal:     Right lower leg: No edema.     Left lower leg: No edema.  Neurological:     General: No focal deficit present.     Mental Status: She is alert  and oriented to person, place, and time.  Psychiatric:        Mood and Affect: Mood normal.        Behavior: Behavior normal.     LABORATORY DATA:  I have reviewed the labs as listed.  CBC Latest Ref Rng & Units 11/19/2020 11/10/2020 11/09/2020  WBC 4.0 - 10.5 K/uL 7.0 6.7 8.6  Hemoglobin 12.0 - 15.0 g/dL 13.1 13.2 13.0  Hematocrit 36.0 - 46.0 % 41.4 41.4 41.2  Platelets 150 - 400 K/uL 268 226 243   CMP Latest Ref Rng & Units 11/19/2020 11/10/2020 09/28/2020  Glucose 70 - 99 mg/dL 118(H) 154(H) 109(H)  BUN 8 - 23 mg/dL 13 11 10   Creatinine 0.44 - 1.00 mg/dL 0.68 0.72 0.52  Sodium 135 - 145 mmol/L 137 136 141  Potassium 3.5 - 5.1 mmol/L 3.3(L) 3.6 4.0  Chloride 98 - 111 mmol/L 101 101 105  CO2 22 - 32 mmol/L 28 27 25   Calcium 8.9 - 10.3 mg/dL 9.3 9.6 9.6  Total Protein 6.5 - 8.1 g/dL 8.2(H) 7.9 8.2(H)  Total Bilirubin 0.3 - 1.2 mg/dL 0.4 0.6 0.8  Alkaline Phos 38 - 126 U/L 71 70 76  AST 15 - 41 U/L 20 24 22   ALT 0 - 44 U/L 15 16 16     DIAGNOSTIC IMAGING:  I have independently reviewed the scans and discussed with the patient. NM PET Image Restag (PS) Skull Base To Thigh  Result Date: 11/02/2020 CLINICAL DATA:  Subsequent treatment strategy for colon cancer with metastatic disease. EXAM: NUCLEAR MEDICINE PET SKULL BASE TO THIGH TECHNIQUE: 7.26 mCi F-18 FDG was injected intravenously. Full-ring PET imaging was performed from the skull base to thigh after the radiotracer. CT data was obtained and used for attenuation correction and anatomic  localization. Fasting blood glucose: 99 mg/dl COMPARISON:  Multiple prior studies, most recent exam from March of 2022. FINDINGS: Mediastinal blood pool activity: SUV max 2.8 Liver activity: SUV max NA NECK: LEFT thoracic inlet lymph node with enlargement without significant corresponding FDG uptake. Maximum SUV of 3.1. Node measuring 2.2 cm short axis similar to previous imaging from September 27, 2020 No additional signs of nodal enlargement in the neck.  Incidental CT findings: none CHEST: Enlarged lymph nodes in the chest, further enlargement of lymph nodes along the RIGHT paratracheal chain since the previous study. 95/3 1.8 cm RIGHT paratracheal lymph node previously less than a cm with a maximum SUV of 3.4. 82/3 a 1 cm lymph node in the superior mediastinum between the LEFT carotid and brachiocephalic origins with a maximum SUV of 5.4 Precarinal lymph node on image 99/3 approximately 1 cm, previously less than a cm with a maximum SUV of 3.9 Activity in the RIGHT hilum with suspected lymph node in this location, similar FDG uptake to mediastinal lymph nodes. Bulky retrocrural lymph node 2.5 cm with moderate FDG uptake approximately 4.04 maximum SUV. Similar uptake tracking into upper abdominal lymph nodes. Numerous bilateral pulmonary nodules which have enlarged since the previous study. Also multiple new pulmonary nodules are evident most less than a cm. Is Other nodules that were present previously show similar enlargement. RIGHT upper lobe pulmonary nodule (image 77, series 3) 11 mm, previously 8 mm. Image 110/3 1.3 cm RIGHT lower lobe pulmonary nodule with a maximum SUV of 1.6. This previously measured approximately 0.7 cm. Incidental CT findings: RIGHT-sided Port-A-Cath in place. Heart size stable. No substantial pericardial effusion. Aorta is normal caliber. Central pulmonary vessels normal caliber. Limited assessment of cardiovascular structures given lack of intravenous contrast. ABDOMEN/PELVIS: Bulky mesenteric mass (image 194, series 3) 5.0 x 4.1 cm. The dominant portion of the mass, central portion shows limited FDG uptake. Associated mesenteric lymph nodes on image 206 of series 3 with intense metabolic activity maximum SUV in this area of approximately 9.3. Lymph nodes have enlarged within the small bowel mesentery since the previous study. For example on image 209 of series 3 a 2 mm lymph node previously measured approximately 7 mm. Other lymph  nodes in this area have enlarged to a similar extent compared to recent imaging. Retroperitoneal adenopathy with similar size 2.1 cm lymph node on 186/3. LEFT periaortic lymph node on 186/3 2 cm. These are unchanged and with moderate FDG uptake Focal hepatic lesion increased in size 2.6 x 2.1 as compared to 1.4 x 0.9 cm with maximum SUV of 4.7. Subtle area of hypodensity in the dome of the RIGHT hemi liver on image 145 of series 3 suggested but not confirmed. Mildly heterogeneous uptake throughout the liver without additional discrete focus of increased metabolic activity Incidental CT findings: No acute findings related to liver, gallbladder, spleen, pancreas and adrenal glands. Smooth contour the kidneys without hydronephrosis or perinephric stranding. Postoperative changes related to partial colectomy. No ascites. Uterus and adnexal structures grossly normal. SKELETON: No focal hypermetabolic activity to suggest skeletal metastasis. Incidental CT findings: none IMPRESSION: Interval worsening of disease since the prior study with enlarging lymph nodes in the chest and abdomen as described. Increasing number and size of pulmonary nodules. Areas that were largest on the prior study and showed low attenuation are little changed and show limited FDG uptake. Areas that have developed and or enlarged in the interval show moderate FDG uptake with most pronounced uptake in lymph nodes adjacent to the  dominant mesenteric mass in the small bowel mesentery. Increased size of dominant hepatic lesion. Questionable area developing in the dome of the RIGHT hemi liver. Attention on follow-up. Electronically Signed   By: Zetta Bills M.D.   On: 11/02/2020 12:19   US Abdomen Limited  Result Date: 11/10/2020 INDICATION: 75 year old female with a history of mucinous adenocarcinoma of the colon with imaging evidence concerning for multifocal metastatic disease. However, prior biopsies tend to yield a majority of mucinous tissue  and only rare cancer cells. Patient is undergone prior biopsy of 2 sites of nodal metastatic disease which has not yielded sufficient cellular material to allow for advanced molecular testing. She presents for biopsy of a hypermetabolic lesion in the liver in an effort to obtain adequate tissue for molecular testing. EXAM: CT-guided biopsy of liver lesion. MEDICATIONS: None. ANESTHESIA/SEDATION: Moderate (conscious) sedation was employed during this procedure. A total of Versed mg and Fentanyl mcg was administered intravenously. Moderate Sedation Time: minutes. The patient's level of consciousness and vital signs were monitored continuously by radiology nursing throughout the procedure under my direct supervision. FLUOROSCOPY TIME:  Fluoroscopy Time:  minutes  seconds ( mGy). COMPLICATIONS: None immediate. PROCEDURE: Informed written consent was obtained from the patient after a thorough discussion of the procedural risks, benefits and alternatives. All questions were addressed. Maximal Sterile Barrier Technique was utilized including caps, mask, sterile gowns, sterile gloves, sterile drape, hand hygiene and skin antiseptic. A timeout was performed prior to the initiation of the procedure. Initially, the abdomen was interrogated with ultrasound. A very subtle isoechoic lesion is identified in the region of the known hypermetabolic hepatic mass. The overlying skin was sterilely prepped and draped in the standard fashion with chlorhexidine skin prep. Local anesthesia was attained by infiltration with 1% lidocaine. A small dermatotomy was made. Next, under real-time ultrasound guidance, an attempt was made to advance a 17 gauge introducer needle into the margin of the mass. However, as the patient became sedated and her respirations more shallow, the liver retracted upward under the ribs and adequate visualization could not be achieved. Therefore, the procedure was stopped and the patient transported to CT to attempt  biopsy under CT imaging guidance. A planning axial CT scan was performed after the patient was successfully positioned supine on the CT gantry. The hypoechoic 2.7 x 2.0 cm lesion is easily identified. A suitable skin entry site was selected and marked. The overlying skin was again sterilely prepped and draped with chlorhexidine in the standard fashion. Local anesthesia was again attained by infiltration with 1% lidocaine. A second small dermatotomy was made. Under intermittent CT guidance, a 17 gauge introducer needle was carefully advanced into the mass. CT imaging confirms presence of the needle within the mass. Multiple 18 gauge core biopsies were then obtained coaxially using the bio Pince automated biopsy device. 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 Best obtainable specimens were collected. Gel-Foam embolization of the biopsy tract as the introducer needle was removed. Post biopsy CT imaging demonstrates no evidence of immediate complication. IMPRESSION: 1. Aborted attempted ultrasound-guided biopsy secondary to poor visualization of the mass. 2. Successful CT-guided biopsy of hypermetabolic liver lesion. Of note, biopsy specimens are highly gelatinous consistent with the known clinical history of mucinous adenocarcinoma. Numerous biopsies from the center and margin of the lesion were obtained. These are the best obtainable specimens. Further percutaneous sampling is unlikely to yield any additional diagnostic material. Electronically Signed   By: Dellis Filbert.D.  On: 11/10/2020 09:21   CT LIVER MASS BIOPSY  Result Date: 11/10/2020 INDICATION: 75 year old female with a history of mucinous adenocarcinoma of the colon with imaging evidence concerning for multifocal metastatic disease. However, prior biopsies tend to yield a majority of mucinous tissue and only rare cancer cells. Patient is undergone prior biopsy of 2 sites of nodal  metastatic disease which has not yielded sufficient cellular material to allow for advanced molecular testing. She presents for biopsy of a hypermetabolic lesion in the liver in an effort to obtain adequate tissue for molecular testing. EXAM: CT-guided biopsy of liver lesion. MEDICATIONS: None. ANESTHESIA/SEDATION: Moderate (conscious) sedation was employed during this procedure. A total of Versed mg and Fentanyl mcg was administered intravenously. Moderate Sedation Time: minutes. The patient's level of consciousness and vital signs were monitored continuously by radiology nursing throughout the procedure under my direct supervision. FLUOROSCOPY TIME:  Fluoroscopy Time:  minutes  seconds ( mGy). COMPLICATIONS: None immediate. PROCEDURE: Informed written consent was obtained from the patient after a thorough discussion of the procedural risks, benefits and alternatives. All questions were addressed. Maximal Sterile Barrier Technique was utilized including caps, mask, sterile gowns, sterile gloves, sterile drape, hand hygiene and skin antiseptic. A timeout was performed prior to the initiation of the procedure. Initially, the abdomen was interrogated with ultrasound. A very subtle isoechoic lesion is identified in the region of the known hypermetabolic hepatic mass. The overlying skin was sterilely prepped and draped in the standard fashion with chlorhexidine skin prep. Local anesthesia was attained by infiltration with 1% lidocaine. A small dermatotomy was made. Next, under real-time ultrasound guidance, an attempt was made to advance a 17 gauge introducer needle into the margin of the mass. However, as the patient became sedated and her respirations more shallow, the liver retracted upward under the ribs and adequate visualization could not be achieved. Therefore, the procedure was stopped and the patient transported to CT to attempt biopsy under CT imaging guidance. A planning axial CT scan was performed after the  patient was successfully positioned supine on the CT gantry. The hypoechoic 2.7 x 2.0 cm lesion is easily identified. A suitable skin entry site was selected and marked. The overlying skin was again sterilely prepped and draped with chlorhexidine in the standard fashion. Local anesthesia was again attained by infiltration with 1% lidocaine. A second small dermatotomy was made. Under intermittent CT guidance, a 17 gauge introducer needle was carefully advanced into the mass. CT imaging confirms presence of the needle within the mass. Multiple 18 gauge core biopsies were then obtained coaxially using the bio Pince automated biopsy device. 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 Best obtainable specimens were collected. Gel-Foam embolization of the biopsy tract as the introducer needle was removed. Post biopsy CT imaging demonstrates no evidence of immediate complication. IMPRESSION: 1. Aborted attempted ultrasound-guided biopsy secondary to poor visualization of the mass. 2. Successful CT-guided biopsy of hypermetabolic liver lesion. Of note, biopsy specimens are highly gelatinous consistent with the known clinical history of mucinous adenocarcinoma. Numerous biopsies from the center and margin of the lesion were obtained. These are the best obtainable specimens. Further percutaneous sampling is unlikely to yield any additional diagnostic material. Electronically Signed   By: Jacqulynn Cadet M.D.   On: 11/10/2020 09:21     ASSESSMENT:  1. 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 -Status  post 3 cycles of FOLFOX from 02/10/2014 through 03/10/2014, discontinued secondary to poor tolerance. DPD was negative. -Last colonoscopy on 03/28/2018 patent ileo-colonic anastomosis. 8 mm polyp in the transverse  colon. Diverticulosis in the sigmoid colon, descending colon. -CT scan on 11/04/2018 shows postoperative findings of right colectomy with redemonstrated mesenteric nodule/lymph node measuring 1.9 x 1.7 cm, unchanged from prior scan in October 2019. No evidence of metastatic disease. Stable solid and groundglass pulmonary nodules, stable over multiple prior exams. -CEA was 4.6 on 10/28/2018. -CTAP on 09/21/2020 showed numerous small pulmonary nodules throughout the lung bases, 5 mm. Enlarged retrocrural periaortic lymph node in the lower chest. Soft tissue nodule within the central mesentery substantially increased in size, encasing proximal superior mesenteric artery and measuring 5 x 4.3 cm. Numerous retroperitoneal lymph nodes, largest aortocaval lymph node measuring 2.8 x 2.2 cm. -CT chest on 09/27/2020 shows a left thoracic inlet lymph node approximately 2.3 cm. Mediastinal lymph nodes and multiple subcentimeter pulmonary nodules suggestive of metastatic disease. -Mesenteric lymph node biopsy on 09/28/2020 shows mucin and fibrosis. -Left supraclavicular lymph node biopsy on 10/14/2020-soft tissue with abundant mucin, rare fragments of atypical columnar epithelium. - FOLFIRI and bevacizumab started on 11/10/2020.  2. Health maintenance: -Mammogram dated 05/16/2018 was BI-RADS Category 1.    PLAN:  1.Metastatic colon cancer to the liver, lungs and lymph nodes: -  She has tolerated cycle 1 of FOLFIRI at 60% dose reasonably well. - Reviewed her labs today which showed normal LFTs and CBC.  Last UA was negative for protein. - Reported slight fatigue after last cycle. - We will increase FOLFIRI to 67% dose and introduce bevacizumab today. - I discussed side effects of bevacizumab in detail.  She will proceed with cycle 2 today.  RTC 2 weeks for follow-up.   2.Diarrhea: -She is taking Imodium 2 tablets in the morning followed by 1 tablet.  She takes anywhere between 3 to 4 tablets which  is helping her diarrhea.  3.  Abdominal pain: -  Abdominal pain has completely resolved after first cycle of chemotherapy.  4.  Nutrition: -  Continue 1 can of boost per day along with meals.  She lost 1 pound in the last 2 weeks.   Orders placed this encounter:  No orders of the defined types were placed in this encounter.    Derek Jack, MD [facility] [phone]   I, Thana Ates, am acting as a scribe for Dr. Derek Jack.  I, Derek Jack MD, have reviewed the above documentation for accuracy and completeness, and I agree with the above.

## 2020-11-24 ENCOUNTER — Inpatient Hospital Stay (HOSPITAL_COMMUNITY): Payer: Medicare Other

## 2020-11-24 ENCOUNTER — Other Ambulatory Visit: Payer: Self-pay

## 2020-11-24 ENCOUNTER — Inpatient Hospital Stay (HOSPITAL_BASED_OUTPATIENT_CLINIC_OR_DEPARTMENT_OTHER): Payer: Medicare Other | Admitting: Hematology

## 2020-11-24 VITALS — BP 129/55 | HR 72 | Temp 97.0°F | Resp 18 | Wt 152.0 lb

## 2020-11-24 VITALS — BP 117/43 | HR 66 | Temp 97.2°F | Resp 18

## 2020-11-24 DIAGNOSIS — C787 Secondary malignant neoplasm of liver and intrahepatic bile duct: Secondary | ICD-10-CM

## 2020-11-24 DIAGNOSIS — C7801 Secondary malignant neoplasm of right lung: Secondary | ICD-10-CM | POA: Diagnosis not present

## 2020-11-24 DIAGNOSIS — C770 Secondary and unspecified malignant neoplasm of lymph nodes of head, face and neck: Secondary | ICD-10-CM | POA: Diagnosis not present

## 2020-11-24 DIAGNOSIS — Z95828 Presence of other vascular implants and grafts: Secondary | ICD-10-CM

## 2020-11-24 DIAGNOSIS — C184 Malignant neoplasm of transverse colon: Secondary | ICD-10-CM

## 2020-11-24 DIAGNOSIS — C189 Malignant neoplasm of colon, unspecified: Secondary | ICD-10-CM

## 2020-11-24 DIAGNOSIS — Z5111 Encounter for antineoplastic chemotherapy: Secondary | ICD-10-CM

## 2020-11-24 DIAGNOSIS — Z5112 Encounter for antineoplastic immunotherapy: Secondary | ICD-10-CM | POA: Diagnosis not present

## 2020-11-24 DIAGNOSIS — R197 Diarrhea, unspecified: Secondary | ICD-10-CM | POA: Diagnosis not present

## 2020-11-24 DIAGNOSIS — T451X5A Adverse effect of antineoplastic and immunosuppressive drugs, initial encounter: Secondary | ICD-10-CM

## 2020-11-24 LAB — CBC WITH DIFFERENTIAL/PLATELET
Abs Immature Granulocytes: 0.02 10*3/uL (ref 0.00–0.07)
Basophils Absolute: 0 10*3/uL (ref 0.0–0.1)
Basophils Relative: 1 %
Eosinophils Absolute: 0.1 10*3/uL (ref 0.0–0.5)
Eosinophils Relative: 2 %
HCT: 42.3 % (ref 36.0–46.0)
Hemoglobin: 13.5 g/dL (ref 12.0–15.0)
Immature Granulocytes: 0 %
Lymphocytes Relative: 20 %
Lymphs Abs: 1.3 10*3/uL (ref 0.7–4.0)
MCH: 29.9 pg (ref 26.0–34.0)
MCHC: 31.9 g/dL (ref 30.0–36.0)
MCV: 93.6 fL (ref 80.0–100.0)
Monocytes Absolute: 0.7 10*3/uL (ref 0.1–1.0)
Monocytes Relative: 10 %
Neutro Abs: 4.5 10*3/uL (ref 1.7–7.7)
Neutrophils Relative %: 67 %
Platelets: 214 10*3/uL (ref 150–400)
RBC: 4.52 MIL/uL (ref 3.87–5.11)
RDW: 13.2 % (ref 11.5–15.5)
WBC: 6.6 10*3/uL (ref 4.0–10.5)
nRBC: 0 % (ref 0.0–0.2)

## 2020-11-24 LAB — COMPREHENSIVE METABOLIC PANEL
ALT: 12 U/L (ref 0–44)
AST: 20 U/L (ref 15–41)
Albumin: 4 g/dL (ref 3.5–5.0)
Alkaline Phosphatase: 68 U/L (ref 38–126)
Anion gap: 9 (ref 5–15)
BUN: 12 mg/dL (ref 8–23)
CO2: 27 mmol/L (ref 22–32)
Calcium: 9.3 mg/dL (ref 8.9–10.3)
Chloride: 100 mmol/L (ref 98–111)
Creatinine, Ser: 0.66 mg/dL (ref 0.44–1.00)
GFR, Estimated: >60 mL/min (ref >60)
Glucose, Bld: 99 mg/dL (ref 70–99)
Potassium: 4.3 mmol/L (ref 3.5–5.1)
Sodium: 136 mmol/L (ref 135–145)
Total Bilirubin: 0.5 mg/dL (ref 0.3–1.2)
Total Protein: 7.9 g/dL (ref 6.5–8.1)

## 2020-11-24 LAB — MAGNESIUM: Magnesium: 2.3 mg/dL (ref 1.7–2.4)

## 2020-11-24 MED ORDER — SODIUM CHLORIDE 0.9 % IV SOLN
5.0000 mg/kg | Freq: Once | INTRAVENOUS | Status: AC
  Administered 2020-11-24: 350 mg via INTRAVENOUS
  Filled 2020-11-24: qty 14

## 2020-11-24 MED ORDER — PALONOSETRON HCL INJECTION 0.25 MG/5ML
0.2500 mg | Freq: Once | INTRAVENOUS | Status: AC
  Administered 2020-11-24: 0.25 mg via INTRAVENOUS
  Filled 2020-11-24: qty 5

## 2020-11-24 MED ORDER — ATROPINE SULFATE 1 MG/ML IJ SOLN
0.5000 mg | Freq: Once | INTRAMUSCULAR | Status: AC
  Administered 2020-11-24: 0.5 mg via INTRAVENOUS
  Filled 2020-11-24: qty 1

## 2020-11-24 MED ORDER — SODIUM CHLORIDE 0.9% FLUSH: 10.0000 mL | INTRAVENOUS | Status: DC | PRN

## 2020-11-24 MED ORDER — SODIUM CHLORIDE 0.9 % IV SOLN
10.0000 mg | Freq: Once | INTRAVENOUS | Status: AC
  Administered 2020-11-24: 10 mg via INTRAVENOUS
  Filled 2020-11-24: qty 10

## 2020-11-24 MED ORDER — SODIUM CHLORIDE 0.9 % IV SOLN
120.0000 mg/m2 | Freq: Once | INTRAVENOUS | Status: AC
  Administered 2020-11-24: 220 mg via INTRAVENOUS
  Filled 2020-11-24: qty 10

## 2020-11-24 MED ORDER — SODIUM CHLORIDE 0.9 % IV SOLN
1600.0000 mg/m2 | INTRAVENOUS | Status: DC
  Administered 2020-11-24: 2850 mg via INTRAVENOUS
  Filled 2020-11-24: qty 57

## 2020-11-24 MED ORDER — SODIUM CHLORIDE 0.9 % IV SOLN
266.6667 mg/m2 | Freq: Once | INTRAVENOUS | Status: AC
  Administered 2020-11-24: 474 mg via INTRAVENOUS
  Filled 2020-11-24: qty 23.7

## 2020-11-24 MED ORDER — SODIUM CHLORIDE 0.9 % IV SOLN: Freq: Once | INTRAVENOUS | Status: AC

## 2020-11-24 MED ORDER — FLUOROURACIL CHEMO INJECTION 500 MG/10ML
266.6667 mg/m2 | Freq: Once | INTRAVENOUS | Status: AC
  Administered 2020-11-24: 450 mg via INTRAVENOUS
  Filled 2020-11-24: qty 9

## 2020-11-24 MED ORDER — SODIUM CHLORIDE 0.9 % IV SOLN
Freq: Once | INTRAVENOUS | Status: AC
Start: 2020-11-24 — End: 2020-11-24

## 2020-11-24 NOTE — Patient Instructions (Signed)
[facility]  Discharge Instructions: Thank you for choosing [facility] to provide your oncology and hematology care.  If you have a lab appointment with the [facility], please come in thru the Main Entrance and check in at the main information desk.  Wear comfortable clothing and clothing appropriate for easy access to any Portacath or PICC line.   We strive to give you quality time with your provider. You may need to reschedule your appointment if you arrive late (15 or more minutes).  Arriving late affects you and other patients whose appointments are after yours.  Also, if you miss three or more appointments without notifying the office, you may be dismissed from the clinic at the provider's discretion.      For prescription refill requests, have your pharmacy contact our office and allow 72 hours for refills to be completed.    Today you received the following chemotherapy and/or immunotherapy agents avastin, irinotecan, leucovorin, adruicil.       To help prevent nausea and vomiting after your treatment, we encourage you to take your nausea medication as directed.  BELOW ARE SYMPTOMS THAT SHOULD BE REPORTED IMMEDIATELY: . *FEVER GREATER THAN 100.4 F (38 C) OR HIGHER . *CHILLS OR SWEATING . *NAUSEA AND VOMITING THAT IS NOT CONTROLLED WITH YOUR NAUSEA MEDICATION . *UNUSUAL SHORTNESS OF BREATH . *UNUSUAL BRUISING OR BLEEDING . *URINARY PROBLEMS (pain or burning when urinating, or frequent urination) . *BOWEL PROBLEMS (unusual diarrhea, constipation, pain near the anus) . TENDERNESS IN MOUTH AND THROAT WITH OR WITHOUT PRESENCE OF ULCERS (sore throat, sores in mouth, or a toothache) . UNUSUAL RASH, SWELLING OR PAIN  . UNUSUAL VAGINAL DISCHARGE OR ITCHING   Items with * indicate a potential emergency and should be followed up as soon as possible or go to the Emergency Department if any problems should occur.  Please show the CHEMOTHERAPY ALERT CARD or  IMMUNOTHERAPY ALERT CARD at check-in to the Emergency Department and triage nurse.  Should you have questions after your visit or need to cancel or reschedule your appointment, please contact [facility] [phone]  and follow the prompts.  Office hours are 8:00 a.m. to 4:30 p.m. Monday - Friday. Please note that voicemails left after 4:00 p.m. may not be returned until the following business day.  We are closed weekends and major holidays. You have access to a nurse at all times for urgent questions. Please call the main number to the clinic [phone] and follow the prompts.  For any non-urgent questions, you may also contact your provider using MyChart. We now offer e-Visits for anyone 76 and older to request care online for non-urgent symptoms. For details visit [email or web address].   Also download the MyChart app! Go to the app store, search "MyChart", open the app, select [facility], and log in with your MyChart username and password.  Due to Covid, a mask is required upon entering the hospital/clinic. If you do not have a mask, one will be given to you upon arrival. For doctor visits, patients may have 1 support person aged 53 or older with them. For treatment visits, patients cannot have anyone with them due to current Covid guidelines and our immunocompromised population.

## 2020-11-24 NOTE — Progress Notes (Signed)
Patient assessed and labs reviewed by Dr Delton Coombes.  No distress noted today.  Okay for treatment.

## 2020-11-24 NOTE — Patient Instructions (Signed)
[facility] at [facility] Discharge Instructions  You were seen today by Dr. Katragadda. He went over your recent results, and you received treatment. Dr. Katragadda will see you back in 2 weeks for labs and follow up.   Thank you for choosing [facility] at [facility] to provide your oncology and hematology care.  To afford each patient quality time with our provider, please arrive at least 15 minutes before your scheduled appointment time.   If you have a lab appointment with the Cancer Center please come in thru the Main Entrance and check in at the main information desk  You need to re-schedule your appointment should you arrive 10 or more minutes late.  We strive to give you quality time with our providers, and arriving late affects you and other patients whose appointments are after yours.  Also, if you no show three or more times for appointments you may be dismissed from the clinic at the providers discretion.     Again, thank you for choosing [facility].  Our hope is that these requests will decrease the amount of time that you wait before being seen by our physicians.       _____________________________________________________________  Should you have questions after your visit to [facility], please contact our office at [phone] between the hours of 8:00 a.m. and 4:30 p.m.  Voicemails left after 4:00 p.m. will not be returned until the following business day.  For prescription refill requests, have your pharmacy contact our office and allow 72 hours.    Cancer Center Support Programs:   > Cancer Support Group  2nd Tuesday of the month 1pm-2pm, Journey Room    

## 2020-11-24 NOTE — Progress Notes (Signed)
Patient tolerated chemotherapy with no complaints voiced.  Side effects with management reviewed with understanding verbalized.  Port site clean and dry with no bruising or swelling noted at site.  Good blood return noted before and after administration of chemotherapy.  Chemotherapy pump connected with no alarms noted.   Patient left in satisfactory condition with VSS and no s/s of distress noted.  

## 2020-11-25 ENCOUNTER — Ambulatory Visit (INDEPENDENT_AMBULATORY_CARE_PROVIDER_SITE_OTHER): Payer: Medicare Other | Admitting: Gastroenterology

## 2020-11-26 ENCOUNTER — Other Ambulatory Visit: Payer: Self-pay

## 2020-11-26 ENCOUNTER — Encounter (HOSPITAL_COMMUNITY): Payer: Self-pay

## 2020-11-26 ENCOUNTER — Inpatient Hospital Stay (HOSPITAL_COMMUNITY): Payer: Medicare Other

## 2020-11-26 VITALS — BP 165/76 | HR 68 | Temp 96.5°F | Resp 18

## 2020-11-26 DIAGNOSIS — C770 Secondary and unspecified malignant neoplasm of lymph nodes of head, face and neck: Secondary | ICD-10-CM | POA: Diagnosis not present

## 2020-11-26 DIAGNOSIS — R197 Diarrhea, unspecified: Secondary | ICD-10-CM | POA: Diagnosis not present

## 2020-11-26 DIAGNOSIS — Z5112 Encounter for antineoplastic immunotherapy: Secondary | ICD-10-CM | POA: Diagnosis not present

## 2020-11-26 DIAGNOSIS — C184 Malignant neoplasm of transverse colon: Secondary | ICD-10-CM | POA: Diagnosis not present

## 2020-11-26 DIAGNOSIS — Z95828 Presence of other vascular implants and grafts: Secondary | ICD-10-CM

## 2020-11-26 DIAGNOSIS — C189 Malignant neoplasm of colon, unspecified: Secondary | ICD-10-CM

## 2020-11-26 DIAGNOSIS — C7801 Secondary malignant neoplasm of right lung: Secondary | ICD-10-CM | POA: Diagnosis not present

## 2020-11-26 DIAGNOSIS — C787 Secondary malignant neoplasm of liver and intrahepatic bile duct: Secondary | ICD-10-CM | POA: Diagnosis not present

## 2020-11-26 MED ORDER — SODIUM CHLORIDE 0.9% FLUSH
10.0000 mL | INTRAVENOUS | Status: DC | PRN
  Administered 2020-11-26: 10 mL

## 2020-11-26 MED ORDER — HEPARIN SOD (PORK) LOCK FLUSH 100 UNIT/ML IV SOLN
500.0000 [IU] | Freq: Once | INTRAVENOUS | Status: AC | PRN
  Administered 2020-11-26: 500 [IU]

## 2020-11-26 MED ORDER — ONDANSETRON 8 MG PO TBDP: 8.0000 mg | ORAL_TABLET | Freq: Three times a day (TID) | ORAL | 3 refills | Status: DC | PRN

## 2020-11-26 NOTE — Addendum Note (Signed)
Addended by: Joie Bimler on: 11/26/2020 02:04 PM   Modules accepted: Orders

## 2020-11-26 NOTE — Progress Notes (Addendum)
Port [place], Port flushed with good blood return noted. No bruising or swelling at site. Bandaid applied and patient discharged in satisfactory condition. VVS stable with no signs or symptoms of distressed noted.

## 2020-11-26 NOTE — Patient Instructions (Signed)
[facility]  Discharge Instructions: ?Thank you for choosing [facility] to provide your oncology and hematology care.  ?If you have a lab appointment with the Cancer Center, please come in thru the Main Entrance and check in at the main information desk. ? ?Wear comfortable clothing and clothing appropriate for easy access to any Portacath or PICC line.  ? ?We strive to give you quality time with your provider. You may need to reschedule your appointment if you arrive late (15 or more minutes).  Arriving late affects you and other patients whose appointments are after yours.  Also, if you miss three or more appointments without notifying the office, you may be dismissed from the clinic at the provider?s discretion.    ?  ?For prescription refill requests, have your pharmacy contact our office and allow 72 hours for refills to be completed.   ? ?Today your pump was disconnected, return as scheduled. ?  ?To help prevent nausea and vomiting after your treatment, we encourage you to take your nausea medication as directed. ? ?BELOW ARE SYMPTOMS THAT SHOULD BE REPORTED IMMEDIATELY: ?*FEVER GREATER THAN 100.4 F (38 ?C) OR HIGHER ?*CHILLS OR SWEATING ?*NAUSEA AND VOMITING THAT IS NOT CONTROLLED WITH YOUR NAUSEA MEDICATION ?*UNUSUAL SHORTNESS OF BREATH ?*UNUSUAL BRUISING OR BLEEDING ?*URINARY PROBLEMS (pain or burning when urinating, or frequent urination) ?*BOWEL PROBLEMS (unusual diarrhea, constipation, pain near the anus) ?TENDERNESS IN MOUTH AND THROAT WITH OR WITHOUT PRESENCE OF ULCERS (sore throat, sores in mouth, or a toothache) ?UNUSUAL RASH, SWELLING OR PAIN  ?UNUSUAL VAGINAL DISCHARGE OR ITCHING  ? ?Items with * indicate a potential emergency and should be followed up as soon as possible or go to the Emergency Department if any problems should occur. ? ?Please show the CHEMOTHERAPY ALERT CARD or IMMUNOTHERAPY ALERT CARD at check-in to the Emergency Department and triage nurse. ? ?Should  you have questions after your visit or need to cancel or reschedule your appointment, please contact  [facility]ANC[facility]ENTER [phone]  and follow the prompts.  Office hours are 8:00 a.m. to 4:30 p.m. Monday - Friday. Please note that voicemails left after 4:00 p.m. may not be returned until the following business day.  We are closed weekends and major holidays. You have access to a nurse at all times for urgent questions. Please call the main number to the clinic [phone] and follow the prompts. ? ?For any non-urgent questions, you may also contact your provider using MyChart. We now offer e-Visits for anyone 18 and older to request care online for non-urgent symptoms. For details visit [email or web address]. ?  ?Also download the MyChart app! Go to the app store, search "MyChart", open the app, select [facility], and log in with your MyChart username and password. ? ?Due to Covid, a mask is required upon entering the hospital/clinic. If you do not have a mask, one will be given to you upon arrival. For doctor visits, patients may have 1 support person aged 18 or older with them. For treatment visits, patients cannot have anyone with them due to current Covid guidelines and our immunocompromised population.  ?

## 2020-12-03 DIAGNOSIS — C787 Secondary malignant neoplasm of liver and intrahepatic bile duct: Secondary | ICD-10-CM | POA: Diagnosis not present

## 2020-12-03 DIAGNOSIS — C187 Malignant neoplasm of sigmoid colon: Secondary | ICD-10-CM | POA: Diagnosis not present

## 2020-12-07 NOTE — Progress Notes (Signed)
Amanda Norris, Amanda Norris   CLINIC:  Medical Oncology/Hematology  PCP:  Amanda Norris, Amanda Norris / Amanda Norris [place] [id number] [phone]   REASON FOR VISIT:  Follow-up for transverse colon adenocarcinoma  PRIOR THERAPY:  1.Right hemicolectomy on 12/29/2013. 2. FOLFOX x 3 cycles from 02/10/2014 to 03/10/2014, stopped secondary to poor tolerance.  NGS Results: not done  CURRENT THERAPY: FOLFIRI, Avastin and Aloxi every 2 weeks  BRIEF ONCOLOGIC HISTORY:  Oncology History  Adenocarcinoma of transverse colon ([place]) (Resolved)  12/09/2013 Initial Diagnosis   1. Colon, biopsy, proximal transverse mass INVASIVE ADENOCARCINOMA.   12/29/2013 Definitive Surgery   Colon, segmental resection for tumor, right - INVASIVE COLORECTAL ADENOCARCINOMA, 7 CM, EXTENDING INTO PERICOLONIC CONNECTIVE TISSUE. - METASTATIC CARCINOMA IN 6 OF 33 LYMPH NODES (6/33).   02/10/2014 - 03/10/2014 Adjuvant Chemotherapy   FOLFOX x 3 cycles   03/11/2014 Adverse Reaction   Patient called reporting diarrhea despite dose reduction and Amanda Norris wants to stop therapy.  Patient seen on 03/24/14 to discuss other treatment options and Amanda Norris stands by her decision to stop all therapy.   04/01/2014 Surgery   Port-a-cath removal by Dr. Arnoldo Morale.   01/12/2015 PET scan   Mild abdominal mesenteric LAD show hypermetabolic activity, 7 mm indeterminate pulm nodule in sup segment of RLL shows no assoc metabolic activity   0/38/3338 PET scan   Mild progression of nodal mets in anterior mid abdominal mesentery, new nodal mets at L thoracic inlet. stable 6 mm nodule in posterior RLL, unchanged since 2015   06/22/2015 PET scan   Resolution of metabolic activity and decreased size of mild LAD at L thoracic inlet, stable mild mid abd hypermetabolic mesenteric LAD, stable 6 mm posterior LLL pulm nodule without metabolic activity   10/06/1914 Imaging   MRI lumbar spine L4-L5 disc degenerated,  broad based disc hernation, B facet arthropathy with hypertophy and edema, stenosis of lateral recesses that could cause neural compression   10/18/2016 Imaging   CT CAP- 1. Several small ground-glass pulmonary nodules are present in the lungs and are stable over the past 9 months, but several are mildly larger than they were in 2015. Low-grade adenocarcinoma can sometimes have this appearance and surveillance is likely warranted. 2. There is a cluster of nodal tissue in the central mesentery, maximum short axis diameter 1.3 cm. This nodal cluster is relatively low in density and not appreciably changed from the prior exam. Given the lack of change is may represent quiescent residua of malignancy, but again, surveillance is likely warranted. 3. The left-sided rib fractures are more numerous than was revealed on the prior radiographs ; there are nondisplaced healing fractures of the left fifth, sixth, seventh, eighth, ninth, and tenth ribs. 4. Mild cardiomegaly although part of this appearance may be due to pectus excavatum. 5. Several additional small pulmonary nodules are stable from 2015 and considered benign. 6. Right hemicolectomy and sigmoid colon diverticulosis. 7. Small right paraumbilical hernia containing adipose tissue. 8. Lower lumbar spondylosis and degenerative disc disease potentially with mild impingement at L4-5.   04/25/2017 Imaging   CT C/A/P: Scattered solid pulmonary nodules measure up to 6 mm in the right lower lobe, unchanged. There are a few scattered ground-glass nodules measuring up to 8 mm in the right upper lobe, also stable. Distal perigastric lymph nodes are stable. No additional evidence of metastatic disease.   Metastatic colon cancer to liver ([place])  11/04/2020 Initial Diagnosis   Metastatic colon cancer  to liver [facility])   11/10/2020 -  Chemotherapy    Patient is on Treatment Plan: COLORECTAL FOLFIRI / BEVACIZUMAB Q14D        CANCER STAGING: Cancer  Staging No matching staging information was found for the patient.  INTERVAL HISTORY:  Amanda Norris, a 75 y.o. female, returns for routine follow-up and consideration for next cycle of chemotherapy. Amanda Norris was last seen on 11/24/2020.  Due for cycle #3 of FOLFIRI / BEVACIZUMAB today.   Overall, Amanda Norris tells me Amanda Norris has been feeling pretty well. Amanda Norris reports severe headaches which have caused sleep disturbances that lasted 2 days following pump removal. Amanda Norris reports increased anxiety leading up to treatment which has also caused sleep disturbances. Amanda Norris vomited twice the night following the pump removal. Amanda Norris reports occasional nausea but no current vomiting. Amanda Norris has constant diarrhea that Amanda Norris is treating with imodium and it is currently stable. Her energy waxes and wanes but is around 80% off her baseline. Amanda Norris reports occasional small amounts of blood in mucous when blowing nose as well as occasional mild abdominal pain. Her appetite is good.   Overall, Amanda Norris feels ready for next cycle of chemo today.   REVIEW OF SYSTEMS:  Review of Systems  Constitutional: Positive for fatigue (80%). Negative for appetite change.  HENT:   Positive for nosebleeds (when blowing nose) and trouble swallowing (chewing).   Respiratory: Positive for cough (allergies) and shortness of breath (w/ exertion).   Gastrointestinal: Positive for abdominal pain (occasional; mild), diarrhea (treating w/ imodium), nausea (after Tx) and vomiting (after Tx).  Neurological: Positive for headaches and numbness (L foot 3-4 years; slightly worse).  Psychiatric/Behavioral: Positive for depression and sleep disturbance. The patient is nervous/anxious.   All other systems reviewed and are negative.   PAST MEDICAL/SURGICAL HISTORY:  Past Medical History:  Diagnosis Date  . Arthritis    wrist and thumbs  . Colon cancer ([place])   . PONV (postoperative nausea and vomiting)   . Port-A-Cath in place 11/09/2020  . Ruptured lumbar disc    L1-L5  per patient report  . Sciatica of left side    Past Surgical History:  Procedure Laterality Date  . COLONOSCOPY N/A 12/12/2013   Procedure: COLONOSCOPY;  Surgeon: Rogene Houston, MD;  [facility];  Service: Endoscopy;  Laterality: N/A;  730  . COLONOSCOPY N/A 01/20/2015   Procedure: COLONOSCOPY;  Surgeon: Rogene Houston, MD;  [facility]: [facility];  Service: Endoscopy;  Laterality: N/A;  1030  . COLONOSCOPY N/A 03/28/2018   Procedure: COLONOSCOPY;  Surgeon: Rogene Houston, MD;  Location: [facility];  Service: Endoscopy;  Laterality: N/A;  1015  . EUS N/A 12/18/2013   Procedure: UPPER ENDOSCOPIC ULTRASOUND (EUS) LINEAR;  Surgeon: Milus Banister, MD;  Location: [facility];  Service: Endoscopy;  Laterality: N/A;  . herniated disc     [id number]  . IR IMAGING GUIDED PORT INSERTION  10/14/2020  . IR US GUIDANCE  10/14/2020  . PARTIAL COLECTOMY N/A 12/29/2013   Procedure: RIGHT HEMICOLECTOMY;  Surgeon: Jamesetta So, MD;  Location: [facility];  Service: General;  Laterality: N/A;  . POLYPECTOMY  03/28/2018   Procedure: POLYPECTOMY;  Surgeon: Rogene Houston, MD;  [facility]: [facility];  Service: Endoscopy;;  transverse colon (hot snare);  Marland Kitchen PORT-A-CATH REMOVAL Right 04/01/2014   Procedure: MINOR REMOVAL PORT-A-CATH;  Surgeon: Jamesetta So, MD;  Location: [facility];  Service: General;  Laterality: Right;  . PORTACATH PLACEMENT Right 02/04/2014  Procedure: INSERTION PORT-A-CATH;  Surgeon: Jamesetta So, MD;  Location: [facility];  Service: General;  Laterality: Right;    SOCIAL HISTORY:  Social History   Socioeconomic History  . Marital status: Married    Spouse name: Not on file  . Number of children: Not on file  . Years of education: Not on file  . Highest education level: Not on file  Occupational History  . Not on file  Tobacco Use  . Smoking status: Former Smoker    Packs/day: 0.25    Years: 5.00    Pack years: 1.25    Types: Cigarettes    Quit date: 04/19/1959     Years since quitting: 61.6  . Smokeless tobacco: Never Used  . Tobacco comment: smoked for 5 years as teenager  Vaping Use  . Vaping Use: Never used  Substance and Sexual Activity  . Alcohol use: No  . Drug use: No  . Sexual activity: Yes    Birth control/protection: Post-menopausal  Other Topics Concern  . Not on file  Social History Narrative  . Not on file   Social Determinants of Health   Financial Resource Strain: Low Risk   . Difficulty of Paying Living Expenses: Not hard at all  Food Insecurity: No Food Insecurity  . Worried About Charity fundraiser in the Last Year: Never true  . Ran Out of Food in the Last Year: Never true  Transportation Needs: No Transportation Needs  . Lack of Transportation (Medical): No  . Lack of Transportation (Non-Medical): No  Physical Activity: Inactive  . Days of Exercise per Week: 0 days  . Minutes of Exercise per Session: 0 min  Stress: Stress Concern Present  . Feeling of Stress : To some extent  Social Connections: Socially Integrated  . Frequency of Communication with Friends and Family: More than three times a week  . Frequency of Social Gatherings with Friends and Family: More than three times a week  . Attends Religious Services: More than 4 times per year  . Active Member of Clubs or Organizations: Yes  . Attends Archivist Meetings: More than 4 times per year  . Marital Status: Married  Human resources officer Violence: Not At Risk  . Fear of Current or Ex-Partner: No  . Emotionally Abused: No  . Physically Abused: No  . Sexually Abused: No    FAMILY HISTORY:  Family History  Problem Relation Age of Onset  . Diabetes type II Mother   . Dementia Father     CURRENT MEDICATIONS:  Current Outpatient Medications  Medication Sig Dispense Refill  . acetaminophen (TYLENOL) 500 MG tablet Take 500 mg by mouth every 6 (six) hours as needed for moderate pain or headache.    Marland Kitchen aspirin 81 MG EC tablet Take 1 tablet (81 mg  total) by mouth daily. Swallow whole. 30 tablet 12  . Bioflavonoid Products (ESTER C PO) Take 1,000 mg by mouth daily.    . Cholecalciferol (D3-1000) 25 MCG (1000 UT) capsule Take 125 Units by mouth daily.    . Cranberry-Vitamin C-Vitamin E 4200-20-3 MG-MG-UNIT CAPS Take 500 mg by mouth daily.    . Garlic 6195 MG CAPS Take 1,000 mg by mouth daily.     Marland Kitchen lidocaine-prilocaine (EMLA) cream Apply small amount to port a cath site and cover with plastic wrap 1 hour prior to infusion appointments 30 g 3  . loperamide (IMODIUM A-D) 2 MG tablet Take 1 tablet (2 mg total) by mouth as  needed. Take 2 at diarrhea onset, then 1 tablet after each loose stool.  DO NOT exceed 8 tablets in a 24 hour period. May take 2 every 4hrs at night. If diarrhea recurs repeat. 100 tablet 1  . Multiple Vitamin (MULTIVITAMIN) tablet Take 1 tablet by mouth daily.    . Omega-3 Fatty Acids (FISH OIL) 1200 MG CAPS Take 1,000 mg by mouth daily.    . ondansetron (ZOFRAN ODT) 8 MG disintegrating tablet Take 1 tablet (8 mg total) by mouth every 8 (eight) hours as needed for nausea or vomiting. 30 tablet 3  . prochlorperazine (COMPAZINE) 10 MG tablet Take 1 tablet (10 mg total) by mouth every 6 (six) hours as needed (NAUSEA). 30 tablet 1   No current facility-administered medications for this visit.    ALLERGIES:  Allergies  Allergen Reactions  . Sulfa Antibiotics Other (See Comments)    "Sores in my mouth"    PHYSICAL EXAM:  Performance status (ECOG): 1 - Symptomatic but completely ambulatory  There were no vitals filed for this visit. Wt Readings from Last 3 Encounters:  11/24/20 152 lb (68.9 kg)  11/19/20 154 lb 12.8 oz (70.2 kg)  11/10/20 153 lb (69.4 kg)   Physical Exam Vitals reviewed.  Constitutional:      Appearance: Normal appearance.  Cardiovascular:     Rate and Rhythm: Normal rate and regular rhythm.     Pulses: Normal pulses.     Heart sounds: Normal heart sounds.  Pulmonary:     Effort: Pulmonary effort  is normal.     Breath sounds: Normal breath sounds.  Abdominal:     Palpations: Abdomen is soft. There is no hepatomegaly, splenomegaly or mass.     Tenderness: There is no abdominal tenderness.  Musculoskeletal:     Right lower leg: No edema.     Left lower leg: No edema.  Neurological:     General: No focal deficit present.     Mental Status: Amanda Norris is alert and oriented to person, place, and time.  Psychiatric:        Mood and Affect: Mood normal.        Behavior: Behavior normal.     LABORATORY DATA:  I have reviewed the labs as listed.  CBC Latest Ref Rng & Units 11/24/2020 11/19/2020 11/10/2020  WBC 4.0 - 10.5 K/uL 6.6 7.0 6.7  Hemoglobin 12.0 - 15.0 g/dL 13.5 13.1 13.2  Hematocrit 36.0 - 46.0 % 42.3 41.4 41.4  Platelets 150 - 400 K/uL 214 268 226   CMP Latest Ref Rng & Units 11/24/2020 11/19/2020 11/10/2020  Glucose 70 - 99 mg/dL 99 118(H) 154(H)  BUN 8 - 23 mg/dL _0 Creatinine 0.44 - 1.00 mg/dL 0.66 0.68 0.72  Sodium 135 - 145 mmol/L 136 137 136  Potassium 3.5 - 5.1 mmol/L 4.3 3.3(L) 3.6  Chloride 98 - 111 mmol/L 100 101 101  CO2 22 - 32 mmol/L _1 Calcium 8.9 - 10.3 mg/dL 9.3 9.3 9.6  Total Protein 6.5 - 8.1 g/dL 7.9 8.2(H) 7.9  Total Bilirubin 0.3 - 1.2 mg/dL 0.5 0.4 0.6  Alkaline Phos 38 - 126 U/L 68 71 70  AST 15 - 41 U/L _2 ALT 0 - 44 U/L _3 DIAGNOSTIC IMAGING:  I have independently reviewed the scans and discussed with the patient. US Abdomen Limited  Result Date: 11/10/2020 INDICATION: 75 year old female with a history of mucinous adenocarcinoma of the colon with  imaging evidence concerning for multifocal metastatic disease. However, prior biopsies tend to yield a majority of mucinous tissue and only rare cancer cells. Patient is undergone prior biopsy of 2 sites of nodal metastatic disease which has not yielded sufficient cellular material to allow for advanced molecular testing. Amanda Norris presents for biopsy of a hypermetabolic lesion in the  liver in an effort to obtain adequate tissue for molecular testing. EXAM: CT-guided biopsy of liver lesion. MEDICATIONS: None. ANESTHESIA/SEDATION: Moderate (conscious) sedation was employed during this procedure. A total of Versed mg and Fentanyl mcg was administered intravenously. Moderate Sedation Time: minutes. The patient's level of consciousness and vital signs were monitored continuously by radiology nursing throughout the procedure under my direct supervision. FLUOROSCOPY TIME:  Fluoroscopy Time:  minutes  seconds ( mGy). COMPLICATIONS: None immediate. PROCEDURE: Informed written consent was obtained from the patient after a thorough discussion of the procedural risks, benefits and alternatives. All questions were addressed. Maximal Sterile Barrier Technique was utilized including caps, mask, sterile gowns, sterile gloves, sterile drape, hand hygiene and skin antiseptic. A timeout was performed prior to the initiation of the procedure. Initially, the abdomen was interrogated with ultrasound. A very subtle isoechoic lesion is identified in the region of the known hypermetabolic hepatic mass. The overlying skin was sterilely prepped and draped in the standard fashion with chlorhexidine skin prep. Local anesthesia was attained by infiltration with 1% lidocaine. A small dermatotomy was made. Next, under real-time ultrasound guidance, an attempt was made to advance a 17 gauge introducer needle into the margin of the mass. However, as the patient became sedated and her respirations more shallow, the liver retracted upward under the ribs and adequate visualization could not be achieved. Therefore, the procedure was stopped and the patient transported to CT to attempt biopsy under CT imaging guidance. A planning axial CT scan was performed after the patient was successfully positioned supine on the CT gantry. The hypoechoic 2.7 x 2.0 cm lesion is easily identified. A suitable skin entry site was selected and marked.  The overlying skin was again sterilely prepped and draped with chlorhexidine in the standard fashion. Local anesthesia was again attained by infiltration with 1% lidocaine. A second small dermatotomy was made. Under intermittent CT guidance, a 17 gauge introducer needle was carefully advanced into the mass. CT imaging confirms presence of the needle within the mass. Multiple 18 gauge core biopsies were then obtained coaxially using the bio Pince automated biopsy device. 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 Best obtainable specimens were collected. Gel-Foam embolization of the biopsy tract as the introducer needle was removed. Post biopsy CT imaging demonstrates no evidence of immediate complication. IMPRESSION: 1. Aborted attempted ultrasound-guided biopsy secondary to poor visualization of the mass. 2. Successful CT-guided biopsy of hypermetabolic liver lesion. Of note, biopsy specimens are highly gelatinous consistent with the known clinical history of mucinous adenocarcinoma. Numerous biopsies from the center and margin of the lesion were obtained. These are the best obtainable specimens. Further percutaneous sampling is unlikely to yield any additional diagnostic material. Electronically Signed   By: Jacqulynn Cadet M.D.   On: 11/10/2020 09:21   CT LIVER MASS BIOPSY  Result Date: 11/10/2020 INDICATION: 75 year old female with a history of mucinous adenocarcinoma of the colon with imaging evidence concerning for multifocal metastatic disease. However, prior biopsies tend to yield a majority of mucinous tissue and only rare cancer cells. Patient is undergone prior biopsy of 2 sites of nodal metastatic disease which  has not yielded sufficient cellular material to allow for advanced molecular testing. Amanda Norris presents for biopsy of a hypermetabolic lesion in the liver in an effort to obtain adequate tissue for molecular testing. EXAM: CT-guided  biopsy of liver lesion. MEDICATIONS: None. ANESTHESIA/SEDATION: Moderate (conscious) sedation was employed during this procedure. A total of Versed mg and Fentanyl mcg was administered intravenously. Moderate Sedation Time: minutes. The patient's level of consciousness and vital signs were monitored continuously by radiology nursing throughout the procedure under my direct supervision. FLUOROSCOPY TIME:  Fluoroscopy Time:  minutes  seconds ( mGy). COMPLICATIONS: None immediate. PROCEDURE: Informed written consent was obtained from the patient after a thorough discussion of the procedural risks, benefits and alternatives. All questions were addressed. Maximal Sterile Barrier Technique was utilized including caps, mask, sterile gowns, sterile gloves, sterile drape, hand hygiene and skin antiseptic. A timeout was performed prior to the initiation of the procedure. Initially, the abdomen was interrogated with ultrasound. A very subtle isoechoic lesion is identified in the region of the known hypermetabolic hepatic mass. The overlying skin was sterilely prepped and draped in the standard fashion with chlorhexidine skin prep. Local anesthesia was attained by infiltration with 1% lidocaine. A small dermatotomy was made. Next, under real-time ultrasound guidance, an attempt was made to advance a 17 gauge introducer needle into the margin of the mass. However, as the patient became sedated and her respirations more shallow, the liver retracted upward under the ribs and adequate visualization could not be achieved. Therefore, the procedure was stopped and the patient transported to CT to attempt biopsy under CT imaging guidance. A planning axial CT scan was performed after the patient was successfully positioned supine on the CT gantry. The hypoechoic 2.7 x 2.0 cm lesion is easily identified. A suitable skin entry site was selected and marked. The overlying skin was again sterilely prepped and draped with chlorhexidine in the  standard fashion. Local anesthesia was again attained by infiltration with 1% lidocaine. A second small dermatotomy was made. Under intermittent CT guidance, a 17 gauge introducer needle was carefully advanced into the mass. CT imaging confirms presence of the needle within the mass. Multiple 18 gauge core biopsies were then obtained coaxially using the bio Pince automated biopsy device. 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 Best obtainable specimens were collected. Gel-Foam embolization of the biopsy tract as the introducer needle was removed. Post biopsy CT imaging demonstrates no evidence of immediate complication. IMPRESSION: 1. Aborted attempted ultrasound-guided biopsy secondary to poor visualization of the mass. 2. Successful CT-guided biopsy of hypermetabolic liver lesion. Of note, biopsy specimens are highly gelatinous consistent with the known clinical history of mucinous adenocarcinoma. Numerous biopsies from the center and margin of the lesion were obtained. These are the best obtainable specimens. Further percutaneous sampling is unlikely to yield any additional diagnostic material. Electronically Signed   By: Jacqulynn Cadet M.D.   On: 11/10/2020 09:21     ASSESSMENT:  1. 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 -Status post 3 cycles of FOLFOX from 02/10/2014 through 03/10/2014, discontinued secondary to poor tolerance. DPD was negative. -Last colonoscopy on 03/28/2018 patent ileo-colonic anastomosis. 8 mm polyp in the transverse colon. Diverticulosis in the sigmoid colon, descending colon. -CT scan on 11/04/2018 shows postoperative findings of right colectomy with redemonstrated mesenteric nodule/lymph node measuring 1.9 x 1.7 cm, unchanged from prior scan  in October 2019. No  evidence of metastatic disease. Stable solid and groundglass pulmonary nodules, stable over multiple prior exams. -CEA was 4.6 on 10/28/2018. -CTAP on 09/21/2020 showed numerous small pulmonary nodules throughout the lung bases, 5 mm. Enlarged retrocrural periaortic lymph node in the lower chest. Soft tissue nodule within the central mesentery substantially increased in size, encasing proximal superior mesenteric artery and measuring 5 x 4.3 cm. Numerous retroperitoneal lymph nodes, largest aortocaval lymph node measuring 2.8 x 2.2 cm. -CT chest on 09/27/2020 shows a left thoracic inlet lymph node approximately 2.3 cm. Mediastinal lymph nodes and multiple subcentimeter pulmonary nodules suggestive of metastatic disease. -Mesenteric lymph node biopsy on 09/28/2020 shows mucin and fibrosis. -Left supraclavicular lymph node biopsy on 10/14/2020-soft tissue with abundant mucin, rare fragments of atypical columnar epithelium. - FOLFIRI and bevacizumab started on 11/10/2020. - Caris test-BRAF V600 E+, MMR deficient, MSI high, TMB high, HER2 negative, BRCA1/2 pathogenic variant on exon 14 and exon 9 respectively.  The report also suggested decreased benefit to FOLFOX and bevacizumab in the first-line metastatic setting.  2. Health maintenance: -Mammogram dated 05/16/2018 was BI-RADS Category 1.   PLAN:  1.Metastatic colon cancer to the liver, lungs and lymph nodes: - We have increased her dose to 67% during cycle 2. - Amanda Norris has experienced some vomiting during the night of treatment and could not sleep at all.  Amanda Norris had headaches as a result.  Amanda Norris also had occasional nausea after that. - We will add Emend to her regimen today. - Amanda Norris will take melatonin as needed for sleep. - Reviewed her labs today which showed normal LFTs and CBC.  We have sent a CEA level which is pending.  Based on the Caris test, Amanda Norris could be a candidate for immunotherapy if intolerance or progression on current regimen. - We will  proceed with same dose during cycle 3 today.  RTC 2 weeks for follow-up.  2.Diarrhea: -Diarrhea is stable.  Continue Imodium 2 tablets in the morning followed by 1 tablet.  Amanda Norris takes anywhere between 3 to 4 tablets.  3. Abdominal pain: -  This has completely resolved after first cycle of chemotherapy.  4. Nutrition: -Continue 1 can of boost per day along with meals.   Orders placed this encounter:  No orders of the defined types were placed in this encounter.    Derek Jack, MD [place] [phone]   I, Thana Ates, am acting as a scribe for Dr. Derek Jack.  I, Derek Jack MD, have reviewed the above documentation for accuracy and completeness, and I agree with the above.

## 2020-12-08 ENCOUNTER — Inpatient Hospital Stay (HOSPITAL_BASED_OUTPATIENT_CLINIC_OR_DEPARTMENT_OTHER): Payer: Medicare Other | Admitting: Hematology

## 2020-12-08 ENCOUNTER — Inpatient Hospital Stay (HOSPITAL_COMMUNITY): Payer: Medicare Other

## 2020-12-08 ENCOUNTER — Other Ambulatory Visit: Payer: Self-pay

## 2020-12-08 ENCOUNTER — Inpatient Hospital Stay (HOSPITAL_COMMUNITY): Payer: Medicare Other | Attending: Hematology

## 2020-12-08 VITALS — BP 146/76 | HR 68 | Temp 97.3°F | Resp 18 | Wt 152.5 lb

## 2020-12-08 VITALS — BP 116/71 | HR 59 | Temp 97.2°F | Resp 18

## 2020-12-08 DIAGNOSIS — Z780 Asymptomatic menopausal state: Secondary | ICD-10-CM | POA: Diagnosis not present

## 2020-12-08 DIAGNOSIS — C189 Malignant neoplasm of colon, unspecified: Secondary | ICD-10-CM | POA: Diagnosis not present

## 2020-12-08 DIAGNOSIS — R634 Abnormal weight loss: Secondary | ICD-10-CM | POA: Diagnosis not present

## 2020-12-08 DIAGNOSIS — Z95828 Presence of other vascular implants and grafts: Secondary | ICD-10-CM

## 2020-12-08 DIAGNOSIS — R109 Unspecified abdominal pain: Secondary | ICD-10-CM | POA: Diagnosis not present

## 2020-12-08 DIAGNOSIS — R197 Diarrhea, unspecified: Secondary | ICD-10-CM | POA: Insufficient documentation

## 2020-12-08 DIAGNOSIS — C184 Malignant neoplasm of transverse colon: Secondary | ICD-10-CM

## 2020-12-08 DIAGNOSIS — Z5112 Encounter for antineoplastic immunotherapy: Secondary | ICD-10-CM | POA: Diagnosis not present

## 2020-12-08 DIAGNOSIS — Z5111 Encounter for antineoplastic chemotherapy: Secondary | ICD-10-CM | POA: Diagnosis not present

## 2020-12-08 DIAGNOSIS — C787 Secondary malignant neoplasm of liver and intrahepatic bile duct: Secondary | ICD-10-CM | POA: Insufficient documentation

## 2020-12-08 DIAGNOSIS — Z9049 Acquired absence of other specified parts of digestive tract: Secondary | ICD-10-CM | POA: Diagnosis not present

## 2020-12-08 DIAGNOSIS — Z87891 Personal history of nicotine dependence: Secondary | ICD-10-CM | POA: Diagnosis not present

## 2020-12-08 DIAGNOSIS — C7800 Secondary malignant neoplasm of unspecified lung: Secondary | ICD-10-CM | POA: Insufficient documentation

## 2020-12-08 DIAGNOSIS — C779 Secondary and unspecified malignant neoplasm of lymph node, unspecified: Secondary | ICD-10-CM | POA: Diagnosis not present

## 2020-12-08 LAB — CBC WITH DIFFERENTIAL/PLATELET
Abs Immature Granulocytes: 0.03 10*3/uL (ref 0.00–0.07)
Basophils Absolute: 0 10*3/uL (ref 0.0–0.1)
Basophils Relative: 1 %
Eosinophils Absolute: 0.1 10*3/uL (ref 0.0–0.5)
Eosinophils Relative: 2 %
HCT: 38.7 % (ref 36.0–46.0)
Hemoglobin: 12.1 g/dL (ref 12.0–15.0)
Immature Granulocytes: 0 %
Lymphocytes Relative: 19 %
Lymphs Abs: 1.3 10*3/uL (ref 0.7–4.0)
MCH: 29.7 pg (ref 26.0–34.0)
MCHC: 31.3 g/dL (ref 30.0–36.0)
MCV: 94.9 fL (ref 80.0–100.0)
Monocytes Absolute: 0.7 10*3/uL (ref 0.1–1.0)
Monocytes Relative: 10 %
Neutro Abs: 4.8 10*3/uL (ref 1.7–7.7)
Neutrophils Relative %: 68 %
Platelets: 187 10*3/uL (ref 150–400)
RBC: 4.08 MIL/uL (ref 3.87–5.11)
RDW: 13.8 % (ref 11.5–15.5)
WBC: 6.9 10*3/uL (ref 4.0–10.5)
nRBC: 0 % (ref 0.0–0.2)

## 2020-12-08 LAB — COMPREHENSIVE METABOLIC PANEL
ALT: 17 U/L (ref 0–44)
AST: 19 U/L (ref 15–41)
Albumin: 3.8 g/dL (ref 3.5–5.0)
Alkaline Phosphatase: 64 U/L (ref 38–126)
Anion gap: 8 (ref 5–15)
BUN: 18 mg/dL (ref 8–23)
CO2: 26 mmol/L (ref 22–32)
Calcium: 9.1 mg/dL (ref 8.9–10.3)
Chloride: 102 mmol/L (ref 98–111)
Creatinine, Ser: 0.61 mg/dL (ref 0.44–1.00)
GFR, Estimated: >60 mL/min (ref >60)
Glucose, Bld: 112 mg/dL — ABNORMAL HIGH (ref 70–99)
Potassium: 3.9 mmol/L (ref 3.5–5.1)
Sodium: 136 mmol/L (ref 135–145)
Total Bilirubin: 0.3 mg/dL (ref 0.3–1.2)
Total Protein: 7.4 g/dL (ref 6.5–8.1)

## 2020-12-08 LAB — MAGNESIUM: Magnesium: 2.2 mg/dL (ref 1.7–2.4)

## 2020-12-08 MED ORDER — SODIUM CHLORIDE 0.9 % IV SOLN
1600.0000 mg/m2 | INTRAVENOUS | Status: DC
  Administered 2020-12-08: 2850 mg via INTRAVENOUS
  Filled 2020-12-08: qty 50

## 2020-12-08 MED ORDER — SODIUM CHLORIDE 0.9% FLUSH: 10.0000 mL | INTRAVENOUS | Status: DC | PRN

## 2020-12-08 MED ORDER — SODIUM CHLORIDE 0.9 % IV SOLN: Freq: Once | INTRAVENOUS | Status: AC

## 2020-12-08 MED ORDER — PALONOSETRON HCL INJECTION 0.25 MG/5ML
0.2500 mg | Freq: Once | INTRAVENOUS | Status: AC
  Administered 2020-12-08: 0.25 mg via INTRAVENOUS
  Filled 2020-12-08: qty 5

## 2020-12-08 MED ORDER — ATROPINE SULFATE 1 MG/ML IJ SOLN: 0.5000 mg | Freq: Once | INTRAMUSCULAR | Status: DC | PRN

## 2020-12-08 MED ORDER — SODIUM CHLORIDE 0.9 % IV SOLN
Freq: Once | INTRAVENOUS | Status: AC
Start: 2020-12-08 — End: 2020-12-08

## 2020-12-08 MED ORDER — SODIUM CHLORIDE 0.9 % IV SOLN
266.6667 mg/m2 | Freq: Once | INTRAVENOUS | Status: AC
  Administered 2020-12-08: 474 mg via INTRAVENOUS
  Filled 2020-12-08: qty 23.7

## 2020-12-08 MED ORDER — FLUOROURACIL CHEMO INJECTION 500 MG/10ML
266.6667 mg/m2 | Freq: Once | INTRAVENOUS | Status: AC
  Administered 2020-12-08: 450 mg via INTRAVENOUS
  Filled 2020-12-08: qty 9

## 2020-12-08 MED ORDER — ATROPINE SULFATE 1 MG/ML IJ SOLN
0.5000 mg | Freq: Once | INTRAMUSCULAR | Status: AC
  Administered 2020-12-08: 0.5 mg via INTRAVENOUS
  Filled 2020-12-08: qty 1

## 2020-12-08 MED ORDER — SODIUM CHLORIDE 0.9 % IV SOLN
150.0000 mg | Freq: Once | INTRAVENOUS | Status: AC
  Administered 2020-12-08: 150 mg via INTRAVENOUS
  Filled 2020-12-08: qty 150

## 2020-12-08 MED ORDER — SODIUM CHLORIDE 0.9 % IV SOLN
10.0000 mg | Freq: Once | INTRAVENOUS | Status: AC
  Administered 2020-12-08: 10 mg via INTRAVENOUS
  Filled 2020-12-08: qty 10

## 2020-12-08 MED ORDER — SODIUM CHLORIDE 0.9 % IV SOLN
5.0000 mg/kg | Freq: Once | INTRAVENOUS | Status: AC
  Administered 2020-12-08: 350 mg via INTRAVENOUS
  Filled 2020-12-08: qty 14

## 2020-12-08 MED ORDER — SODIUM CHLORIDE 0.9 % IV SOLN
120.0000 mg/m2 | Freq: Once | INTRAVENOUS | Status: AC
  Administered 2020-12-08: 220 mg via INTRAVENOUS
  Filled 2020-12-08: qty 10

## 2020-12-08 NOTE — Progress Notes (Signed)
Patient has been assessed, vital signs and labs have been reviewed by Dr. Katragadda. ANC, Creatinine, LFTs, and Platelets are within treatment parameters per Dr. Katragadda. The patient is good to proceed with treatment at this time. Primary RN and pharmacy aware.  

## 2020-12-08 NOTE — Patient Instructions (Signed)
[facility]  Discharge Instructions: Thank you for choosing [facility] to provide your oncology and hematology care.  If you have a lab appointment with the [facility], please come in thru the Main Entrance and check in at the main information desk.  Wear comfortable clothing and clothing appropriate for easy access to any Portacath or PICC line.   We strive to give you quality time with your provider. You may need to reschedule your appointment if you arrive late (15 or more minutes).  Arriving late affects you and other patients whose appointments are after yours.  Also, if you miss three or more appointments without notifying the office, you may be dismissed from the clinic at the provider's discretion.      For prescription refill requests, have your pharmacy contact our office and allow 72 hours for refills to be completed.    Today you received the following chemotherapy and/or immunotherapy agents avastin/FOLFIRI    To help prevent nausea and vomiting after your treatment, we encourage you to take your nausea medication as directed.  BELOW ARE SYMPTOMS THAT SHOULD BE REPORTED IMMEDIATELY: . *FEVER GREATER THAN 100.4 F (38 C) OR HIGHER . *CHILLS OR SWEATING . *NAUSEA AND VOMITING THAT IS NOT CONTROLLED WITH YOUR NAUSEA MEDICATION . *UNUSUAL SHORTNESS OF BREATH . *UNUSUAL BRUISING OR BLEEDING . *URINARY PROBLEMS (pain or burning when urinating, or frequent urination) . *BOWEL PROBLEMS (unusual diarrhea, constipation, pain near the anus) . TENDERNESS IN MOUTH AND THROAT WITH OR WITHOUT PRESENCE OF ULCERS (sore throat, sores in mouth, or a toothache) . UNUSUAL RASH, SWELLING OR PAIN  . UNUSUAL VAGINAL DISCHARGE OR ITCHING   Items with * indicate a potential emergency and should be followed up as soon as possible or go to the Emergency Department if any problems should occur.  Please show the CHEMOTHERAPY ALERT CARD or IMMUNOTHERAPY ALERT CARD at check-in  to the Emergency Department and triage nurse.  Should you have questions after your visit or need to cancel or reschedule your appointment, please contact [facility] [phone]  and follow the prompts.  Office hours are 8:00 a.m. to 4:30 p.m. Monday - Friday. Please note that voicemails left after 4:00 p.m. may not be returned until the following business day.  We are closed weekends and major holidays. You have access to a nurse at all times for urgent questions. Please call the main number to the clinic [phone] and follow the prompts.  For any non-urgent questions, you may also contact your provider using MyChart. We now offer e-Visits for anyone 32 and older to request care online for non-urgent symptoms. For details visit [email or web address].   Also download the MyChart app! Go to the app store, search "MyChart", open the app, select [facility], and log in with your MyChart username and password.  Due to Covid, a mask is required upon entering the hospital/clinic. If you do not have a mask, one will be given to you upon arrival. For doctor visits, patients may have 1 support person aged 110 or older with them. For treatment visits, patients cannot have anyone with them due to current Covid guidelines and our immunocompromised population.    Bevacizumab injection What is this medicine? BEVACIZUMAB (be va SIZ yoo mab) is a monoclonal antibody. It is used to treat many types of cancer. This medicine may be used for other purposes; ask your health care provider or pharmacist if you have questions. COMMON BRAND NAME(S): Avastin, MVASI,  Zirabev What should I tell my health care provider before I take this medicine? They need to know if you have any of these conditions:  diabetes  heart disease  high blood pressure  history of coughing up blood  prior anthracycline chemotherapy (e.g., doxorubicin, daunorubicin, epirubicin)  recent or ongoing radiation  therapy  recent or planning to have surgery  stroke  an unusual or allergic reaction to bevacizumab, hamster proteins, mouse proteins, other medicines, foods, dyes, or preservatives  pregnant or trying to get pregnant  breast-feeding How should I use this medicine? This medicine is for infusion into a vein. It is given by a health care professional in a hospital or clinic setting. Talk to your pediatrician regarding the use of this medicine in children. Special care may be needed. Overdosage: If you think you have taken too much of this medicine contact a poison control center or emergency room at once. NOTE: This medicine is only for you. Do not share this medicine with others. What if I miss a dose? It is important not to miss your dose. Call your doctor or health care professional if you are unable to keep an appointment. What may interact with this medicine? Interactions are not expected. This list may not describe all possible interactions. Give your health care provider a list of all the medicines, herbs, non-prescription drugs, or dietary supplements you use. Also tell them if you smoke, drink alcohol, or use illegal drugs. Some items may interact with your medicine. What should I watch for while using this medicine? Your condition will be monitored carefully while you are receiving this medicine. You will need important blood work and urine testing done while you are taking this medicine. This medicine may increase your risk to bruise or bleed. Call your doctor or health care professional if you notice any unusual bleeding. Before having surgery, talk to your health care provider to make sure it is ok. This drug can increase the risk of poor healing of your surgical site or wound. You will need to stop this drug for 28 days before surgery. After surgery, wait at least 28 days before restarting this drug. Make sure the surgical site or wound is healed enough before restarting this  drug. Talk to your health care provider if questions. Do not become pregnant while taking this medicine or for 6 months after stopping it. Women should inform their doctor if they wish to become pregnant or think they might be pregnant. There is a potential for serious side effects to an unborn child. Talk to your health care professional or pharmacist for more information. Do not breast-feed an infant while taking this medicine and for 6 months after the last dose. This medicine has caused ovarian failure in some women. This medicine may interfere with the ability to have a child. You should talk to your doctor or health care professional if you are concerned about your fertility. What side effects may I notice from receiving this medicine? Side effects that you should report to your doctor or health care professional as soon as possible:  allergic reactions like skin rash, itching or hives, swelling of the face, lips, or tongue  chest pain or chest tightness  chills  coughing up blood  high fever  seizures  severe constipation  signs and symptoms of bleeding such as bloody or black, tarry stools; red or dark-brown urine; spitting up blood or brown material that looks like coffee grounds; red spots on the skin; unusual bruising  or bleeding from the eye, gums, or nose  signs and symptoms of a blood clot such as breathing problems; chest pain; severe, sudden headache; pain, swelling, warmth in the leg  signs and symptoms of a stroke like changes in vision; confusion; trouble speaking or understanding; severe headaches; sudden numbness or weakness of the face, arm or leg; trouble walking; dizziness; loss of balance or coordination  stomach pain  sweating  swelling of legs or ankles  vomiting  weight gain Side effects that usually do not require medical attention (report to your doctor or health care professional if they continue or are bothersome):  back pain  changes in  taste  decreased appetite  dry skin  nausea  tiredness This list may not describe all possible side effects. Call your doctor for medical advice about side effects. You may report side effects to FDA at 1-800-FDA-1088. Where should I keep my medicine? This drug is given in a hospital or clinic and will not be stored at home. NOTE: This sheet is a summary. It may not cover all possible information. If you have questions about this medicine, talk to your doctor, pharmacist, or health care provider.  2021 Elsevier/Gold Standard (2019-04-23 10:50:46)    Fluorouracil, 5-FU injection What is this medicine? FLUOROURACIL, 5-FU (flure oh YOOR a sil) is a chemotherapy drug. It slows the growth of cancer cells. This medicine is used to treat many types of cancer like breast cancer, colon or rectal cancer, pancreatic cancer, and stomach cancer. This medicine may be used for other purposes; ask your health care provider or pharmacist if you have questions. COMMON BRAND NAME(S): Adrucil What should I tell my health care provider before I take this medicine? They need to know if you have any of these conditions:  blood disorders  dihydropyrimidine dehydrogenase (DPD) deficiency  infection (especially a virus infection such as chickenpox, cold sores, or herpes)  kidney disease  liver disease  malnourished, poor nutrition  recent or ongoing radiation therapy  an unusual or allergic reaction to fluorouracil, other chemotherapy, other medicines, foods, dyes, or preservatives  pregnant or trying to get pregnant  breast-feeding How should I use this medicine? This drug is given as an infusion or injection into a vein. It is administered in a hospital or clinic by a specially trained health care professional. Talk to your pediatrician regarding the use of this medicine in children. Special care may be needed. Overdosage: If you think you have taken too much of this medicine contact a poison  control center or emergency room at once. NOTE: This medicine is only for you. Do not share this medicine with others. What if I miss a dose? It is important not to miss your dose. Call your doctor or health care professional if you are unable to keep an appointment. What may interact with this medicine? Do not take this medicine with any of the following medications:  live virus vaccines This medicine may also interact with the following medications:  medicines that treat or prevent blood clots like warfarin, enoxaparin, and dalteparin This list may not describe all possible interactions. Give your health care provider a list of all the medicines, herbs, non-prescription drugs, or dietary supplements you use. Also tell them if you smoke, drink alcohol, or use illegal drugs. Some items may interact with your medicine. What should I watch for while using this medicine? Visit your doctor for checks on your progress. This drug may make you feel generally unwell. This is not  uncommon, as chemotherapy can affect healthy cells as well as cancer cells. Report any side effects. Continue your course of treatment even though you feel ill unless your doctor tells you to stop. In some cases, you may be given additional medicines to help with side effects. Follow all directions for their use. Call your doctor or health care professional for advice if you get a fever, chills or sore throat, or other symptoms of a cold or flu. Do not treat yourself. This drug decreases your body's ability to fight infections. Try to avoid being around people who are sick. This medicine may increase your risk to bruise or bleed. Call your doctor or health care professional if you notice any unusual bleeding. Be careful brushing and flossing your teeth or using a toothpick because you may get an infection or bleed more easily. If you have any dental work done, tell your dentist you are receiving this medicine. Avoid taking products  that contain aspirin, acetaminophen, ibuprofen, naproxen, or ketoprofen unless instructed by your doctor. These medicines may hide a fever. Do not become pregnant while taking this medicine. Women should inform their doctor if they wish to become pregnant or think they might be pregnant. There is a potential for serious side effects to an unborn child. Talk to your health care professional or pharmacist for more information. Do not breast-feed an infant while taking this medicine. Men should inform their doctor if they wish to father a child. This medicine may lower sperm counts. Do not treat diarrhea with over the counter products. Contact your doctor if you have diarrhea that lasts more than 2 days or if it is severe and watery. This medicine can make you more sensitive to the sun. Keep out of the sun. If you cannot avoid being in the sun, wear protective clothing and use sunscreen. Do not use sun lamps or tanning beds/booths. What side effects may I notice from receiving this medicine? Side effects that you should report to your doctor or health care professional as soon as possible:  allergic reactions like skin rash, itching or hives, swelling of the face, lips, or tongue  low blood counts - this medicine may decrease the number of white blood cells, red blood cells and platelets. You may be at increased risk for infections and bleeding.  signs of infection - fever or chills, cough, sore throat, pain or difficulty passing urine  signs of decreased platelets or bleeding - bruising, pinpoint red spots on the skin, black, tarry stools, blood in the urine  signs of decreased red blood cells - unusually weak or tired, fainting spells, lightheadedness  breathing problems  changes in vision  chest pain  mouth sores  nausea and vomiting  pain, swelling, redness at site where injected  pain, tingling, numbness in the hands or feet  redness, swelling, or sores on hands or feet  stomach  pain  unusual bleeding Side effects that usually do not require medical attention (report to your doctor or health care professional if they continue or are bothersome):  changes in finger or toe nails  diarrhea  dry or itchy skin  hair loss  headache  loss of appetite  sensitivity of eyes to the light  stomach upset  unusually teary eyes This list may not describe all possible side effects. Call your doctor for medical advice about side effects. You may report side effects to FDA at 1-800-FDA-1088. Where should I keep my medicine? This drug is given in a hospital  or clinic and will not be stored at home. NOTE: This sheet is a summary. It may not cover all possible information. If you have questions about this medicine, talk to your doctor, pharmacist, or health care provider.  2021 Elsevier/Gold Standard (2019-05-27 15:00:03)     Irinotecan injection What is this medicine? IRINOTECAN (ir in oh TEE kan ) is a chemotherapy drug. It is used to treat colon and rectal cancer. This medicine may be used for other purposes; ask your health care provider or pharmacist if you have questions. COMMON BRAND NAME(S): Camptosar What should I tell my health care provider before I take this medicine? They need to know if you have any of these conditions:  dehydration  diarrhea  infection (especially a virus infection such as chickenpox, cold sores, or herpes)  liver disease  low blood counts, like low white cell, platelet, or red cell counts  low levels of calcium, magnesium, or potassium in the blood  recent or ongoing radiation therapy  an unusual or allergic reaction to irinotecan, other medicines, foods, dyes, or preservatives  pregnant or trying to get pregnant  breast-feeding How should I use this medicine? This drug is given as an infusion into a vein. It is administered in a hospital or clinic by a specially trained health care professional. Talk to your  pediatrician regarding the use of this medicine in children. Special care may be needed. Overdosage: If you think you have taken too much of this medicine contact a poison control center or emergency room at once. NOTE: This medicine is only for you. Do not share this medicine with others. What if I miss a dose? It is important not to miss your dose. Call your doctor or health care professional if you are unable to keep an appointment. What may interact with this medicine? Do not take this medicine with any of the following medications:  cobicistat  itraconazole This medicine may interact with the following medications:  antiviral medicines for HIV or AIDS  certain antibiotics like rifampin or rifabutin  certain medicines for fungal infections like ketoconazole, posaconazole, and voriconazole  certain medicines for seizures like carbamazepine, phenobarbital, phenotoin  clarithromycin  gemfibrozil  nefazodone  St. John's Wort This list may not describe all possible interactions. Give your health care provider a list of all the medicines, herbs, non-prescription drugs, or dietary supplements you use. Also tell them if you smoke, drink alcohol, or use illegal drugs. Some items may interact with your medicine. What should I watch for while using this medicine? Your condition will be monitored carefully while you are receiving this medicine. You will need important blood work done while you are taking this medicine. This drug may make you feel generally unwell. This is not uncommon, as chemotherapy can affect healthy cells as well as cancer cells. Report any side effects. Continue your course of treatment even though you feel ill unless your doctor tells you to stop. In some cases, you may be given additional medicines to help with side effects. Follow all directions for their use. You may get drowsy or dizzy. Do not drive, use machinery, or do anything that needs mental alertness until  you know how this medicine affects you. Do not stand or sit up quickly, especially if you are an older patient. This reduces the risk of dizzy or fainting spells. Call your health care professional for advice if you get a fever, chills, or sore throat, or other symptoms of a cold or flu. Do  not treat yourself. This medicine decreases your body's ability to fight infections. Try to avoid being around people who are sick. Avoid taking products that contain aspirin, acetaminophen, ibuprofen, naproxen, or ketoprofen unless instructed by your doctor. These medicines may hide a fever. This medicine may increase your risk to bruise or bleed. Call your doctor or health care professional if you notice any unusual bleeding. Be careful brushing and flossing your teeth or using a toothpick because you may get an infection or bleed more easily. If you have any dental work done, tell your dentist you are receiving this medicine. Do not become pregnant while taking this medicine or for 6 months after stopping it. Women should inform their health care professional if they wish to become pregnant or think they might be pregnant. Men should not father a child while taking this medicine and for 3 months after stopping it. There is potential for serious side effects to an unborn child. Talk to your health care professional for more information. Do not breast-feed an infant while taking this medicine or for 7 days after stopping it. This medicine has caused ovarian failure in some women. This medicine may make it more difficult to get pregnant. Talk to your health care professional if you are concerned about your fertility. This medicine has caused decreased sperm counts in some men. This may make it more difficult to father a child. Talk to your health care professional if you are concerned about your fertility. What side effects may I notice from receiving this medicine? Side effects that you should report to your doctor or  health care professional as soon as possible:  allergic reactions like skin rash, itching or hives, swelling of the face, lips, or tongue  chest pain  diarrhea  flushing, runny nose, sweating during infusion  low blood counts - this medicine may decrease the number of white blood cells, red blood cells and platelets. You may be at increased risk for infections and bleeding.  nausea, vomiting  pain, swelling, warmth in the leg  signs of decreased platelets or bleeding - bruising, pinpoint red spots on the skin, black, tarry stools, blood in the urine  signs of infection - fever or chills, cough, sore throat, pain or difficulty passing urine  signs of decreased red blood cells - unusually weak or tired, fainting spells, lightheadedness Side effects that usually do not require medical attention (report to your doctor or health care professional if they continue or are bothersome):  constipation  hair loss  headache  loss of appetite  mouth sores  stomach pain This list may not describe all possible side effects. Call your doctor for medical advice about side effects. You may report side effects to FDA at 1-800-FDA-1088. Where should I keep my medicine? This drug is given in a hospital or clinic and will not be stored at home. NOTE: This sheet is a summary. It may not cover all possible information. If you have questions about this medicine, talk to your doctor, pharmacist, or health care provider.  2021 Elsevier/Gold Standard (2019-05-27 17:46:13)    Leucovorin injection What is this medicine? LEUCOVORIN (loo koe VOR in) is used to prevent or treat the harmful effects of some medicines. This medicine is used to treat anemia caused by a low amount of folic acid in the body. It is also used with 5-fluorouracil (5-FU) to treat colon cancer. This medicine may be used for other purposes; ask your health care provider or pharmacist if you have  questions. What should I tell my  health care provider before I take this medicine? They need to know if you have any of these conditions:  anemia from low levels of vitamin B-12 in the blood  an unusual or allergic reaction to leucovorin, folic acid, other medicines, foods, dyes, or preservatives  pregnant or trying to get pregnant  breast-feeding How should I use this medicine? This medicine is for injection into a muscle or into a vein. It is given by a health care professional in a hospital or clinic setting. Talk to your pediatrician regarding the use of this medicine in children. Special care may be needed. Overdosage: If you think you have taken too much of this medicine contact a poison control center or emergency room at once. NOTE: This medicine is only for you. Do not share this medicine with others. What if I miss a dose? This does not apply. What may interact with this medicine?  capecitabine  fluorouracil  phenobarbital  phenytoin  primidone  trimethoprim-sulfamethoxazole This list may not describe all possible interactions. Give your health care provider a list of all the medicines, herbs, non-prescription drugs, or dietary supplements you use. Also tell them if you smoke, drink alcohol, or use illegal drugs. Some items may interact with your medicine. What should I watch for while using this medicine? Your condition will be monitored carefully while you are receiving this medicine. This medicine may increase the side effects of 5-fluorouracil, 5-FU. Tell your doctor or health care professional if you have diarrhea or mouth sores that do not get better or that get worse. What side effects may I notice from receiving this medicine? Side effects that you should report to your doctor or health care professional as soon as possible:  allergic reactions like skin rash, itching or hives, swelling of the face, lips, or tongue  breathing problems  fever, infection  mouth sores  unusual bleeding or  bruising  unusually weak or tired Side effects that usually do not require medical attention (report to your doctor or health care professional if they continue or are bothersome):  constipation or diarrhea  loss of appetite  nausea, vomiting This list may not describe all possible side effects. Call your doctor for medical advice about side effects. You may report side effects to FDA at 1-800-FDA-1088. Where should I keep my medicine? This drug is given in a hospital or clinic and will not be stored at home. NOTE: This sheet is a summary. It may not cover all possible information. If you have questions about this medicine, talk to your doctor, pharmacist, or health care provider.  2021 Elsevier/Gold Standard (2007-12-31 16:50:29)

## 2020-12-08 NOTE — Patient Instructions (Signed)
[facility] at [facility] Discharge Instructions  You were seen today by Dr. Katragadda. He went over your recent results, and you received your treatment. Dr. Katragadda will see you back in 2 weeks for labs and follow up.   Thank you for choosing [facility] at [facility] to provide your oncology and hematology care.  To afford each patient quality time with our provider, please arrive at least 15 minutes before your scheduled appointment time.   If you have a lab appointment with the Cancer Center please come in thru the Main Entrance and check in at the main information desk  You need to re-schedule your appointment should you arrive 10 or more minutes late.  We strive to give you quality time with our providers, and arriving late affects you and other patients whose appointments are after yours.  Also, if you no show three or more times for appointments you may be dismissed from the clinic at the providers discretion.     Again, thank you for choosing [facility].  Our hope is that these requests will decrease the amount of time that you wait before being seen by our physicians.       _____________________________________________________________  Should you have questions after your visit to [facility], please contact our office at [phone] between the hours of 8:00 a.m. and 4:30 p.m.  Voicemails left after 4:00 p.m. will not be returned until the following business day.  For prescription refill requests, have your pharmacy contact our office and allow 72 hours.    Cancer Center Support Programs:   > Cancer Support Group  2nd Tuesday of the month 1pm-2pm, Journey Room   

## 2020-12-08 NOTE — Progress Notes (Signed)
Pt here for avastin/folfiri.  Last urine completed on 11/10/20 that was negative for protein. Okay for treatment today per Dr Raliegh Ip.  Tolerated treatment well today without incidence.  Stable during and after treatment.  Vital signs stable prior to discharge.  Discharged in stable condition ambulatory with ambulatory 5FU pump.  AVS reviewed.

## 2020-12-09 ENCOUNTER — Encounter (HOSPITAL_COMMUNITY): Payer: Self-pay | Admitting: Hematology

## 2020-12-09 LAB — CEA: CEA: 39.1 ng/mL — ABNORMAL HIGH (ref 0.0–4.7)

## 2020-12-10 ENCOUNTER — Other Ambulatory Visit: Payer: Self-pay

## 2020-12-10 ENCOUNTER — Encounter (HOSPITAL_COMMUNITY): Payer: Self-pay

## 2020-12-10 ENCOUNTER — Inpatient Hospital Stay (HOSPITAL_COMMUNITY): Payer: Medicare Other

## 2020-12-10 VITALS — BP 160/55 | HR 51 | Temp 97.3°F | Resp 18

## 2020-12-10 DIAGNOSIS — Z5112 Encounter for antineoplastic immunotherapy: Secondary | ICD-10-CM | POA: Diagnosis not present

## 2020-12-10 DIAGNOSIS — C184 Malignant neoplasm of transverse colon: Secondary | ICD-10-CM | POA: Diagnosis not present

## 2020-12-10 DIAGNOSIS — C787 Secondary malignant neoplasm of liver and intrahepatic bile duct: Secondary | ICD-10-CM | POA: Diagnosis not present

## 2020-12-10 DIAGNOSIS — C189 Malignant neoplasm of colon, unspecified: Secondary | ICD-10-CM

## 2020-12-10 DIAGNOSIS — Z5111 Encounter for antineoplastic chemotherapy: Secondary | ICD-10-CM | POA: Diagnosis not present

## 2020-12-10 DIAGNOSIS — Z95828 Presence of other vascular implants and grafts: Secondary | ICD-10-CM

## 2020-12-10 DIAGNOSIS — C7800 Secondary malignant neoplasm of unspecified lung: Secondary | ICD-10-CM | POA: Diagnosis not present

## 2020-12-10 DIAGNOSIS — C779 Secondary and unspecified malignant neoplasm of lymph node, unspecified: Secondary | ICD-10-CM | POA: Diagnosis not present

## 2020-12-10 MED ORDER — HEPARIN SOD (PORK) LOCK FLUSH 100 UNIT/ML IV SOLN
500.0000 [IU] | Freq: Once | INTRAVENOUS | Status: AC | PRN
  Administered 2020-12-10: 500 [IU]

## 2020-12-10 MED ORDER — SODIUM CHLORIDE 0.9% FLUSH
10.0000 mL | INTRAVENOUS | Status: DC | PRN
  Administered 2020-12-10: 10 mL

## 2020-12-10 NOTE — Progress Notes (Signed)
Chemo pump disconnected. Port flushed with good blood return noted. No bruising or swelling at site. Bandaid applied and patient discharged in satisfactory condition. VVS stable with no signs or symptoms of distressed noted. 

## 2020-12-10 NOTE — Patient Instructions (Signed)
[facility]  Discharge Instructions: Thank you for choosing [facility] to provide your oncology and hematology care.  If you have a lab appointment with the [facility], please come in thru the Main Entrance and check in at the main information desk.  Wear comfortable clothing and clothing appropriate for easy access to any Portacath or PICC line.   We strive to give you quality time with your provider. You may need to reschedule your appointment if you arrive late (15 or more minutes).  Arriving late affects you and other patients whose appointments are after yours.  Also, if you miss three or more appointments without notifying the office, you may be dismissed from the clinic at the provider's discretion.      For prescription refill requests, have your pharmacy contact our office and allow 72 hours for refills to be completed.    Today your pump was deaccessed, return as scheduled.  To help prevent nausea and vomiting after your treatment, we encourage you to take your nausea medication as directed.  BELOW ARE SYMPTOMS THAT SHOULD BE REPORTED IMMEDIATELY: . *FEVER GREATER THAN 100.4 F (38 C) OR HIGHER . *CHILLS OR SWEATING . *NAUSEA AND VOMITING THAT IS NOT CONTROLLED WITH YOUR NAUSEA MEDICATION . *UNUSUAL SHORTNESS OF BREATH . *UNUSUAL BRUISING OR BLEEDING . *URINARY PROBLEMS (pain or burning when urinating, or frequent urination) . *BOWEL PROBLEMS (unusual diarrhea, constipation, pain near the anus) . TENDERNESS IN MOUTH AND THROAT WITH OR WITHOUT PRESENCE OF ULCERS (sore throat, sores in mouth, or a toothache) . UNUSUAL RASH, SWELLING OR PAIN  . UNUSUAL VAGINAL DISCHARGE OR ITCHING   Items with * indicate a potential emergency and should be followed up as soon as possible or go to the Emergency Department if any problems should occur.  Please show the CHEMOTHERAPY ALERT CARD or IMMUNOTHERAPY ALERT CARD at check-in to the Emergency Department and triage  nurse.  Should you have questions after your visit or need to cancel or reschedule your appointment, please contact [facility] [phone]  and follow the prompts.  Office hours are 8:00 a.m. to 4:30 p.m. Monday - Friday. Please note that voicemails left after 4:00 p.m. may not be returned until the following business day.  We are closed weekends and major holidays. You have access to a nurse at all times for urgent questions. Please call the main number to the clinic [phone] and follow the prompts.  For any non-urgent questions, you may also contact your provider using MyChart. We now offer e-Visits for anyone 75 and older to request care online for non-urgent symptoms. For details visit [email or web address].   Also download the MyChart app! Go to the app store, search "MyChart", open the app, select [facility], and log in with your MyChart username and password.  Due to Covid, a mask is required upon entering the hospital/clinic. If you do not have a mask, one will be given to you upon arrival. For doctor visits, patients may have 1 support person aged 75 or older with them. For treatment visits, patients cannot have anyone with them due to current Covid guidelines and our immunocompromised population.

## 2020-12-21 NOTE — Progress Notes (Signed)
Amanda Norris, [place] [id number]   CLINIC:  Medical Oncology/Hematology  PCP:  [facility], [place] / [place] [address] [phone]   REASON FOR VISIT:  Follow-up for transverse colon adenocarcinoma  PRIOR THERAPY:  1. Right hemicolectomy on 12/29/2013. 2. FOLFOX x 3 cycles from 02/10/2014 to 03/10/2014, stopped secondary to poor tolerance.  NGS Results: 12/03/2020: MSI high, MMR deficient, BRAF V600E, TMB high   CURRENT THERAPY: FOLFIRI, Avastin and Aloxi every 2 weeks  BRIEF ONCOLOGIC HISTORY:  Oncology History  Adenocarcinoma of transverse colon ([facility]) (Resolved)  12/09/2013 Initial Diagnosis   1. Colon, biopsy, proximal transverse mass INVASIVE ADENOCARCINOMA.    12/29/2013 Definitive Surgery   Colon, segmental resection for tumor, right - INVASIVE COLORECTAL ADENOCARCINOMA, 7 CM, EXTENDING INTO PERICOLONIC CONNECTIVE TISSUE. - METASTATIC CARCINOMA IN 6 OF 33 LYMPH NODES (6/33).    02/10/2014 - 03/10/2014 Adjuvant Chemotherapy   FOLFOX x 3 cycles    03/11/2014 Adverse Reaction   Patient called reporting diarrhea despite dose reduction and she wants to stop therapy.  Patient seen on 03/24/14 to discuss other treatment options and she stands by her decision to stop all therapy.    04/01/2014 Surgery   Port-a-cath removal by Dr. Arnoldo Morale.    01/12/2015 PET scan   Mild abdominal mesenteric LAD show hypermetabolic activity, 7 mm indeterminate pulm nodule in sup segment of RLL shows no assoc metabolic activity    0/38/3338 PET scan   Mild progression of nodal mets in anterior mid abdominal mesentery, new nodal mets at L thoracic inlet. stable 6 mm nodule in posterior RLL, unchanged since 2015    06/22/2015 PET scan   Resolution of metabolic activity and decreased size of mild LAD at L thoracic inlet, stable mild mid abd hypermetabolic mesenteric LAD, stable 6 mm posterior LLL pulm nodule without metabolic activity     10/06/1914 Imaging   MRI lumbar spine L4-L5 disc degenerated, broad based disc hernation, B facet arthropathy with hypertophy and edema, stenosis of lateral recesses that could cause neural compression    10/18/2016 Imaging   CT CAP- 1. Several small ground-glass pulmonary nodules are present in the lungs and are stable over the past 9 months, but several are mildly larger than they were in 2015. Low-grade adenocarcinoma can sometimes have this appearance and surveillance is likely warranted. 2. There is a cluster of nodal tissue in the central mesentery, maximum short axis diameter 1.3 cm. This nodal cluster is relatively low in density and not appreciably changed from the prior exam. Given the lack of change is may represent quiescent residua of malignancy, but again, surveillance is likely warranted. 3. The left-sided rib fractures are more numerous than was revealed on the prior radiographs ; there are nondisplaced healing fractures of the left fifth, sixth, seventh, eighth, ninth, and tenth ribs. 4. Mild cardiomegaly although part of this appearance may be due to pectus excavatum. 5. Several additional small pulmonary nodules are stable from 2015 and considered benign. 6. Right hemicolectomy and sigmoid colon diverticulosis. 7. Small right paraumbilical hernia containing adipose tissue. 8. Lower lumbar spondylosis and degenerative disc disease potentially with mild impingement at L4-5.    04/25/2017 Imaging   CT C/A/P: Scattered solid pulmonary nodules measure up to 6 mm in the right lower lobe, unchanged. There are a few scattered ground-glass nodules measuring up to 8 mm in the right upper lobe, also stable. Distal perigastric lymph nodes are stable. No additional evidence of  metastatic disease.   Metastatic colon cancer to liver ([facility])  11/04/2020 Initial Diagnosis   Metastatic colon cancer to liver ([facility])    11/10/2020 -  Chemotherapy    Patient is on Treatment Plan:  COLORECTAL FOLFIRI / BEVACIZUMAB Q14D         CANCER STAGING: Cancer Staging No matching staging information was found for the patient.  INTERVAL HISTORY:  Amanda Norris, a 75 y.o. female, returns for routine follow-up and consideration for next cycle of chemotherapy. Amanda Norris was last seen on 12/08/2020.  Due for cycle #4 of  FOLFIRI / BEVACIZUMAB  today.   Overall, she tells me she has been feeling pretty well. She denies nausea and vomiting, but she reports diarrhea. She is taking imodium TID. Her abdominal pain is no longer present. Her fatigue and sleep has improved with the use of melatonin. She is not currently taking boost or ensure due to concern the dairy with exacerbate her diarrhea. She denies any issues with bleeding or nosebleeds. She reports arthritis in her spine. Sh denies abdominal cramping. She reports chronic tingling and numbness in her left foot that is stable.   REVIEW OF SYSTEMS:  Review of Systems  Constitutional:  Positive for appetite change (75%) and fatigue (50%).  HENT:   Negative for nosebleeds.   Gastrointestinal:  Positive for diarrhea. Negative for abdominal pain.  Neurological:  Positive for numbness.  Hematological:  Does not bruise/bleed easily.  All other systems reviewed and are negative.  PAST MEDICAL/SURGICAL HISTORY:  Past Medical History:  Diagnosis Date   Arthritis    wrist and thumbs   Colon cancer (HCC)    PONV (postoperative nausea and vomiting)    Port-A-Cath in place 11/09/2020   Ruptured lumbar disc    L1-L5 per patient report   Sciatica of left side    Past Surgical History:  Procedure Laterality Date   COLONOSCOPY N/A 12/12/2013   Procedure: COLONOSCOPY;  Surgeon: Rogene Houston, MD;  [facility];  Service: Endoscopy;  Laterality: N/A;  730   COLONOSCOPY N/A 01/20/2015   Procedure: COLONOSCOPY;  Surgeon: Rogene Houston, MD;  Location: [facility];  Service: Endoscopy;  Laterality: N/A;  1030   COLONOSCOPY N/A  03/28/2018   Procedure: COLONOSCOPY;  Surgeon: Rogene Houston, MD;  Location: [facility];  Service: Endoscopy;  Laterality: N/A;  1015   EUS N/A 12/18/2013   Procedure: UPPER ENDOSCOPIC ULTRASOUND (EUS) LINEAR;  Surgeon: Milus Banister, MD;  Location: [facility];  Service: Endoscopy;  Laterality: N/A;   herniated disc     1996-[id number]   IR IMAGING GUIDED PORT INSERTION  10/14/2020   IR US GUIDANCE  10/14/2020   PARTIAL COLECTOMY N/A 12/29/2013   Procedure: RIGHT HEMICOLECTOMY;  Surgeon: Jamesetta So, MD;  Location: [facility] ORS;  Service: General;  Laterality: N/A;   POLYPECTOMY  03/28/2018   Procedure: POLYPECTOMY;  Surgeon: Rogene Houston, MD;  [facility];  Service: Endoscopy;;  transverse colon (hot snare);   PORT-A-CATH REMOVAL Right 04/01/2014   Procedure: MINOR REMOVAL PORT-A-CATH;  Surgeon: Jamesetta So, MD;  Location: [facility];  Service: General;  Laterality: Right;   PORTACATH PLACEMENT Right 02/04/2014   Procedure: INSERTION PORT-A-CATH;  Surgeon: Jamesetta So, MD;  Location: [facility];  Service: General;  Laterality: Right;    SOCIAL HISTORY:  Social History   Socioeconomic History   Marital status: Married    Spouse name: Not on file  Number of children: Not on file   Years of education: Not on file   Highest education level: Not on file  Occupational History   Not on file  Tobacco Use   Smoking status: Former    Packs/day: 0.25    Years: 5.00    Pack years: 1.25    Types: Cigarettes    Quit date: 04/19/1959    Years since quitting: 61.7   Smokeless tobacco: Never   Tobacco comments:    smoked for 5 years as teenager  Vaping Use   Vaping Use: Never used  Substance and Sexual Activity   Alcohol use: No   Drug use: No   Sexual activity: Yes    Birth control/protection: Post-menopausal  Other Topics Concern   Not on file  Social History Narrative   Not on file   Social Determinants of Health   Financial Resource Strain: Low Risk    Difficulty of  Paying Living Expenses: Not hard at all  Food Insecurity: No Food Insecurity   Worried About Charity fundraiser in the Last Year: Never true   [place] in the Last Year: Never true  Transportation Needs: No Transportation Needs   Lack of Transportation (Medical): No   Lack of Transportation (Non-Medical): No  Physical Activity: Inactive   Days of Exercise per Week: 0 days   Minutes of Exercise per Session: 0 min  Stress: Stress Concern Present   Feeling of Stress : To some extent  Social Connections: Engineer, building services of Communication with Friends and Family: More than three times a week   Frequency of Social Gatherings with Friends and Family: More than three times a week   Attends Religious Services: More than 4 times per year   Active Member of [employer] or Organizations: Yes   Attends Music therapist: More than 4 times per year   Marital Status: Married  Human resources officer Violence: Not At Risk   Fear of Current or Ex-Partner: No   Emotionally Abused: No   Physically Abused: No   Sexually Abused: No    FAMILY HISTORY:  Family History  Problem Relation Age of Onset   Diabetes type II Mother    Dementia Father     CURRENT MEDICATIONS:  Current Outpatient Medications  Medication Sig Dispense Refill   acetaminophen (TYLENOL) 500 MG tablet Take 500 mg by mouth every 6 (six) hours as needed for moderate pain or headache.     aspirin 81 MG EC tablet Take 1 tablet (81 mg total) by mouth daily. Swallow whole. 30 tablet 12   Bioflavonoid Products (ESTER C PO) Take 1,000 mg by mouth daily.     Cholecalciferol (D3-1000) 25 MCG (1000 UT) capsule Take 125 Units by mouth daily.     Cranberry-Vitamin C-Vitamin E 4200-20-3 MG-MG-UNIT CAPS Take 500 mg by mouth daily.     Garlic 4034 MG CAPS Take 1,000 mg by mouth daily.      lidocaine-prilocaine (EMLA) cream Apply small amount to port a cath site and cover with plastic wrap 1 hour prior to infusion  appointments 30 g 3   loperamide (IMODIUM A-D) 2 MG tablet Take 1 tablet (2 mg total) by mouth as needed. Take 2 at diarrhea onset, then 1 tablet after each loose stool.  DO NOT exceed 8 tablets in a 24 hour period. May take 2 every 4hrs at night. If diarrhea recurs repeat. 100 tablet 1   Multiple Vitamin (MULTIVITAMIN) tablet Take  1 tablet by mouth daily.     Omega-3 Fatty Acids (FISH OIL) 1200 MG CAPS Take 1,000 mg by mouth daily.     ondansetron (ZOFRAN ODT) 8 MG disintegrating tablet Take 1 tablet (8 mg total) by mouth every 8 (eight) hours as needed for nausea or vomiting. 30 tablet 3   prochlorperazine (COMPAZINE) 10 MG tablet Take 1 tablet (10 mg total) by mouth every 6 (six) hours as needed (NAUSEA). 30 tablet 1   No current facility-administered medications for this visit.    ALLERGIES:  Allergies  Allergen Reactions   Sulfa Antibiotics Other (See Comments)    "Sores in my mouth"    PHYSICAL EXAM:  Performance status (ECOG): 1 - Symptomatic but completely ambulatory  There were no vitals filed for this visit. Wt Readings from Last 3 Encounters:  12/08/20 152 lb 8 oz (69.2 kg)  11/24/20 152 lb (68.9 kg)  11/19/20 154 lb 12.8 oz (70.2 kg)   Physical Exam Vitals reviewed.  Constitutional:      Appearance: Normal appearance.  Cardiovascular:     Rate and Rhythm: Normal rate and regular rhythm.     Pulses: Normal pulses.     Heart sounds: Normal heart sounds.  Pulmonary:     Effort: Pulmonary effort is normal.     Breath sounds: Normal breath sounds.  Abdominal:     Palpations: Abdomen is soft. There is no hepatomegaly, splenomegaly or mass.     Tenderness: There is no abdominal tenderness.  Musculoskeletal:     Right lower leg: No edema.     Left lower leg: No edema.  Neurological:     General: No focal deficit present.     Mental Status: She is alert and oriented to person, place, and time.  Psychiatric:        Mood and Affect: Mood normal.        Behavior:  Behavior normal.    LABORATORY DATA:  I have reviewed the labs as listed.  CBC Latest Ref Rng & Units 12/08/2020 11/24/2020 11/19/2020  WBC 4.0 - 10.5 K/uL 6.9 6.6 7.0  Hemoglobin 12.0 - 15.0 g/dL 12.1 13.5 13.1  Hematocrit 36.0 - 46.0 % 38.7 42.3 41.4  Platelets 150 - 400 K/uL 187 214 268   CMP Latest Ref Rng & Units 12/08/2020 11/24/2020 11/19/2020  Glucose 70 - 99 mg/dL 112(H) 99 118(H)  BUN 8 - 23 mg/dL 18 12 13   Creatinine 0.44 - 1.00 mg/dL 0.61 0.66 0.68  Sodium 135 - 145 mmol/L 136 136 137  Potassium 3.5 - 5.1 mmol/L 3.9 4.3 3.3(L)  Chloride 98 - 111 mmol/L 102 100 101  CO2 22 - 32 mmol/L 26 27 28   Calcium 8.9 - 10.3 mg/dL 9.1 9.3 9.3  Total Protein 6.5 - 8.1 g/dL 7.4 7.9 8.2(H)  Total Bilirubin 0.3 - 1.2 mg/dL 0.3 0.5 0.4  Alkaline Phos 38 - 126 U/L 64 68 71  AST 15 - 41 U/L 19 20 20   ALT 0 - 44 U/L 17 12 15     DIAGNOSTIC IMAGING:  I have independently reviewed the scans and discussed with the patient. No results found.   ASSESSMENT:  1.  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 - Status post 3 cycles of FOLFOX from 02/10/2014 through 03/10/2014, discontinued secondary to poor tolerance.  DPD was negative. -Last colonoscopy on 03/28/2018 patent ileo-colonic anastomosis.  8  mm polyp in the transverse colon.  Diverticulosis in the sigmoid colon, descending colon. - CT scan on 11/04/2018 shows postoperative findings of right colectomy with redemonstrated mesenteric nodule/lymph node measuring 1.9 x 1.7 cm, unchanged from prior scan in October 2019.  No evidence of metastatic disease.  Stable solid and groundglass pulmonary nodules, stable over multiple prior exams. -CEA was 4.6 on 10/28/2018. -CTAP on 09/21/2020 showed numerous small pulmonary nodules throughout the lung bases, 5 mm.  Enlarged retrocrural periaortic lymph node in the lower  chest.  Soft tissue nodule within the central mesentery substantially increased in size, encasing proximal superior mesenteric artery and measuring 5 x 4.3 cm.  Numerous retroperitoneal lymph nodes, largest aortocaval lymph node measuring 2.8 x 2.2 cm. -CT chest on 09/27/2020 shows a left thoracic inlet lymph node approximately 2.3 cm.  Mediastinal lymph nodes and multiple subcentimeter pulmonary nodules suggestive of metastatic disease. -Mesenteric lymph node biopsy on 09/28/2020 shows mucin and fibrosis. -Left supraclavicular lymph node biopsy on 10/14/2020-soft tissue with abundant mucin, rare fragments of atypical columnar epithelium. - 3 cycles of FOLFIRI and bevacizumab dose reduced from 11/10/2020 through 12/08/2020. - [facility] test-BRAF V600 E+, MMR deficient, MSI high, TMB high, HER2 negative, BRCA1/2 pathogenic variant on exon 14 and exon 9 respectively.  The report also suggested decreased benefit to FOLFOX and bevacizumab in the first-line metastatic setting.   2.  Health maintenance: -Mammogram dated 05/16/2018 was BI-RADS Category 1.  PLAN:  1.  Metastatic colon cancer to the liver, lungs and lymph nodes: - She had more fatigue after cycle 3. - She still continues to be tired today. - Reviewed her labs which showed normal LFTs and electrolytes.  CBC was grossly normal.  CEA has gone up to 39.1 on 12/08/2020, likely from tumor lysis. - I have reviewed her NGS test results which showed MSI high status. - Given MSI high status, I have recommended discontinuing chemotherapy due to poor tolerance. - She would like to go on vacation last week of this month.  I plan to see her back in 3 weeks to initiate her pembrolizumab.  We discussed side effects in detail.   2.  Diarrhea: - Diarrhea stable. - Continue Imodium tablets as needed.   3.  Abdominal pain: - This has completely resolved after first cycle of chemotherapy.   4.  Nutrition: - She lost 3 pounds in the last 2 weeks. - Have recommended  nutritional supplements like boost.   Orders placed this encounter:  No orders of the defined types were placed in this encounter.    Derek Jack, MD [place] [phone]   I, Thana Ates, am acting as a scribe for Dr. Derek Jack.  I, Derek Jack MD, have reviewed the above documentation for accuracy and completeness, and I agree with the above.

## 2020-12-22 ENCOUNTER — Inpatient Hospital Stay (HOSPITAL_COMMUNITY): Payer: Medicare Other

## 2020-12-22 ENCOUNTER — Other Ambulatory Visit: Payer: Self-pay

## 2020-12-22 ENCOUNTER — Inpatient Hospital Stay (HOSPITAL_BASED_OUTPATIENT_CLINIC_OR_DEPARTMENT_OTHER): Payer: Medicare Other | Admitting: Hematology

## 2020-12-22 VITALS — BP 158/72 | HR 74 | Temp 97.0°F | Resp 18 | Wt 149.2 lb

## 2020-12-22 DIAGNOSIS — C779 Secondary and unspecified malignant neoplasm of lymph node, unspecified: Secondary | ICD-10-CM | POA: Diagnosis not present

## 2020-12-22 DIAGNOSIS — C184 Malignant neoplasm of transverse colon: Secondary | ICD-10-CM | POA: Diagnosis not present

## 2020-12-22 DIAGNOSIS — Z5112 Encounter for antineoplastic immunotherapy: Secondary | ICD-10-CM | POA: Diagnosis not present

## 2020-12-22 DIAGNOSIS — Z5111 Encounter for antineoplastic chemotherapy: Secondary | ICD-10-CM | POA: Diagnosis not present

## 2020-12-22 DIAGNOSIS — C787 Secondary malignant neoplasm of liver and intrahepatic bile duct: Secondary | ICD-10-CM | POA: Diagnosis not present

## 2020-12-22 DIAGNOSIS — C189 Malignant neoplasm of colon, unspecified: Secondary | ICD-10-CM

## 2020-12-22 DIAGNOSIS — C7800 Secondary malignant neoplasm of unspecified lung: Secondary | ICD-10-CM | POA: Diagnosis not present

## 2020-12-22 LAB — COMPREHENSIVE METABOLIC PANEL
ALT: 15 U/L (ref 0–44)
AST: 20 U/L (ref 15–41)
Albumin: 4.2 g/dL (ref 3.5–5.0)
Alkaline Phosphatase: 67 U/L (ref 38–126)
Anion gap: 8 (ref 5–15)
BUN: 15 mg/dL (ref 8–23)
CO2: 27 mmol/L (ref 22–32)
Calcium: 9.3 mg/dL (ref 8.9–10.3)
Chloride: 101 mmol/L (ref 98–111)
Creatinine, Ser: 0.66 mg/dL (ref 0.44–1.00)
GFR, Estimated: >60 mL/min (ref >60)
Glucose, Bld: 104 mg/dL — ABNORMAL HIGH (ref 70–99)
Potassium: 3.8 mmol/L (ref 3.5–5.1)
Sodium: 136 mmol/L (ref 135–145)
Total Bilirubin: 0.3 mg/dL (ref 0.3–1.2)
Total Protein: 7.8 g/dL (ref 6.5–8.1)

## 2020-12-22 LAB — URINALYSIS, DIPSTICK ONLY
Bilirubin Urine: NEGATIVE
Glucose, UA: NEGATIVE mg/dL
Hgb urine dipstick: NEGATIVE
Ketones, ur: NEGATIVE mg/dL
Leukocytes,Ua: NEGATIVE
Nitrite: NEGATIVE
Protein, ur: NEGATIVE mg/dL
Specific Gravity, Urine: 1.014 (ref 1.005–1.030)
pH: 6 (ref 5.0–8.0)

## 2020-12-22 LAB — CBC WITH DIFFERENTIAL/PLATELET
Abs Immature Granulocytes: 0.02 10*3/uL (ref 0.00–0.07)
Basophils Absolute: 0 10*3/uL (ref 0.0–0.1)
Basophils Relative: 1 %
Eosinophils Absolute: 0.1 10*3/uL (ref 0.0–0.5)
Eosinophils Relative: 2 %
HCT: 43.2 % (ref 36.0–46.0)
Hemoglobin: 13.6 g/dL (ref 12.0–15.0)
Immature Granulocytes: 0 %
Lymphocytes Relative: 20 %
Lymphs Abs: 1.4 10*3/uL (ref 0.7–4.0)
MCH: 29.6 pg (ref 26.0–34.0)
MCHC: 31.5 g/dL (ref 30.0–36.0)
MCV: 93.9 fL (ref 80.0–100.0)
Monocytes Absolute: 0.8 10*3/uL (ref 0.1–1.0)
Monocytes Relative: 12 %
Neutro Abs: 4.6 10*3/uL (ref 1.7–7.7)
Neutrophils Relative %: 65 %
Platelets: 185 10*3/uL (ref 150–400)
RBC: 4.6 MIL/uL (ref 3.87–5.11)
RDW: 14.5 % (ref 11.5–15.5)
WBC: 6.9 10*3/uL (ref 4.0–10.5)
nRBC: 0 % (ref 0.0–0.2)

## 2020-12-22 LAB — MAGNESIUM: Magnesium: 2.3 mg/dL (ref 1.7–2.4)

## 2020-12-22 NOTE — Patient Instructions (Addendum)
[facility] at [facility] Discharge Instructions  You were seen today by Dr. Katragadda. He went over your recent results. Dr. Katragadda will see you back in 3 weeks for labs and follow up.   Thank you for choosing [facility] at [facility] to provide your oncology and hematology care.  To afford each patient quality time with our provider, please arrive at least 15 minutes before your scheduled appointment time.   If you have a lab appointment with the Cancer Center please come in thru the Main Entrance and check in at the main information desk  You need to re-schedule your appointment should you arrive 10 or more minutes late.  We strive to give you quality time with our providers, and arriving late affects you and other patients whose appointments are after yours.  Also, if you no show three or more times for appointments you may be dismissed from the clinic at the providers discretion.     Again, thank you for choosing [facility].  Our hope is that these requests will decrease the amount of time that you wait before being seen by our physicians.       _____________________________________________________________  Should you have questions after your visit to [facility], please contact our office at [phone] between the hours of 8:00 a.m. and 4:30 p.m.  Voicemails left after 4:00 p.m. will not be returned until the following business day.  For prescription refill requests, have your pharmacy contact our office and allow 72 hours.    Cancer Center Support Programs:   > Cancer Support Group  2nd Tuesday of the month 1pm-2pm, Journey Room    

## 2020-12-22 NOTE — Progress Notes (Signed)
Patient has been assessed, vital signs and labs have been reviewed by Dr. Delton Coombes. ANC, Creatinine, LFTs, and Platelets are within treatment parameters per Dr. Delton Coombes. No treatment today, as plan is changing to [facility] in the future.  Primary RN and pharmacy aware.

## 2020-12-22 NOTE — Progress Notes (Signed)
No treatment today per Dr. Delton Coombes, patient left in satisfactory condition.

## 2020-12-22 NOTE — Patient Instructions (Signed)
[place]  Discharge Instructions: Thank you for choosing [facility] to provide your oncology and hematology care.  If you have a lab appointment with the [facility], please come in thru the Main Entrance and check in at the main information desk.  Wear comfortable clothing and clothing appropriate for easy access to any Portacath or PICC line.   We strive to give you quality time with your provider. You may need to reschedule your appointment if you arrive late (15 or more minutes).  Arriving late affects you and other patients whose appointments are after yours.  Also, if you miss three or more appointments without notifying the office, you may be dismissed from the clinic at the provider's discretion.      For prescription refill requests, have your pharmacy contact our office and allow 72 hours for refills to be completed.    No treatment today, return at scheduled time.   To help prevent nausea and vomiting after your treatment, we encourage you to take your nausea medication as directed.  BELOW ARE SYMPTOMS THAT SHOULD BE REPORTED IMMEDIATELY: *FEVER GREATER THAN 100.4 F (38 C) OR HIGHER *CHILLS OR SWEATING *NAUSEA AND VOMITING THAT IS NOT CONTROLLED WITH YOUR NAUSEA MEDICATION *UNUSUAL SHORTNESS OF BREATH *UNUSUAL BRUISING OR BLEEDING *URINARY PROBLEMS (pain or burning when urinating, or frequent urination) *BOWEL PROBLEMS (unusual diarrhea, constipation, pain near the anus) TENDERNESS IN MOUTH AND THROAT WITH OR WITHOUT PRESENCE OF ULCERS (sore throat, sores in mouth, or a toothache) UNUSUAL RASH, SWELLING OR PAIN  UNUSUAL VAGINAL DISCHARGE OR ITCHING   Items with * indicate a potential emergency and should be followed up as soon as possible or go to the Emergency Department if any problems should occur.  Please show the CHEMOTHERAPY ALERT CARD or IMMUNOTHERAPY ALERT CARD at check-in to the Emergency Department and triage nurse.  Should you have  questions after your visit or need to cancel or reschedule your appointment, please contact [facility] [phone]  and follow the prompts.  Office hours are 8:00 a.m. to 4:30 p.m. Monday - Friday. Please note that voicemails left after 4:00 p.m. may not be returned until the following business day.  We are closed weekends and major holidays. You have access to a nurse at all times for urgent questions. Please call the main number to the clinic [phone] and follow the prompts.  For any non-urgent questions, you may also contact your provider using MyChart. We now offer e-Visits for anyone 85 and older to request care online for non-urgent symptoms. For details visit [email or web address].   Also download the MyChart app! Go to the app store, search "MyChart", open the app, select [place], and log in with your MyChart username and password.  Due to Covid, a mask is required upon entering the hospital/clinic. If you do not have a mask, one will be given to you upon arrival. For doctor visits, patients may have 1 support person aged 71 or older with them. For treatment visits, patients cannot have anyone with them due to current Covid guidelines and our immunocompromised population.

## 2020-12-23 LAB — CEA: CEA: 30.2 ng/mL — ABNORMAL HIGH (ref 0.0–4.7)

## 2020-12-24 ENCOUNTER — Encounter (HOSPITAL_COMMUNITY): Payer: Medicare Other

## 2020-12-29 DIAGNOSIS — H35361 Drusen (degenerative) of macula, right eye: Secondary | ICD-10-CM | POA: Diagnosis not present

## 2020-12-29 DIAGNOSIS — H2513 Age-related nuclear cataract, bilateral: Secondary | ICD-10-CM | POA: Diagnosis not present

## 2021-01-12 ENCOUNTER — Other Ambulatory Visit (HOSPITAL_COMMUNITY): Payer: Self-pay | Admitting: Hematology

## 2021-01-12 ENCOUNTER — Encounter (HOSPITAL_COMMUNITY): Payer: Self-pay | Admitting: Hematology

## 2021-01-12 NOTE — Progress Notes (Signed)
DISCONTINUE ON PATHWAY REGIMEN - Colorectal     A cycle is every 14 days:     Bevacizumab-xxxx      Irinotecan      Leucovorin      Fluorouracil      Fluorouracil   **Always confirm dose/schedule in your pharmacy ordering system**  REASON: Toxicities / Adverse Event PRIOR TREATMENT: [id number]: FOLFIRI + Bevacizumab q14 Days TREATMENT RESPONSE: Partial Response (PR)  START ON PATHWAY REGIMEN - Colorectal     A cycle is every 21 days:     Pembrolizumab   **Always confirm dose/schedule in your pharmacy ordering system**  Patient Characteristics: Distant Metastases, Nonsurgical Candidate, BRAF V600 Mutation Positive (KRAS/NRAS Wild-Type), Immunotherapy, MSI-H/dMMR Tumor Location: Colon Therapeutic Status: Distant Metastases Microsatellite/Mismatch Repair Status: MSI-H/dMMR BRAF Mutation Status: Mutation Positive KRAS/NRAS Mutation Status: Wild-Type (no mutation) Intent of Therapy: Non-Curative / Palliative Intent, Discussed with Patient

## 2021-01-13 ENCOUNTER — Encounter (HOSPITAL_COMMUNITY): Payer: Self-pay | Admitting: Hematology

## 2021-01-13 ENCOUNTER — Inpatient Hospital Stay (HOSPITAL_BASED_OUTPATIENT_CLINIC_OR_DEPARTMENT_OTHER): Payer: Medicare Other | Admitting: Hematology

## 2021-01-13 ENCOUNTER — Other Ambulatory Visit: Payer: Self-pay

## 2021-01-13 ENCOUNTER — Inpatient Hospital Stay (HOSPITAL_COMMUNITY): Payer: Medicare Other

## 2021-01-13 ENCOUNTER — Ambulatory Visit (HOSPITAL_COMMUNITY): Payer: Medicare Other | Admitting: Dietician

## 2021-01-13 ENCOUNTER — Inpatient Hospital Stay (HOSPITAL_COMMUNITY): Payer: Medicare Other | Attending: Hematology

## 2021-01-13 VITALS — BP 151/70 | HR 66 | Temp 97.2°F | Resp 18 | Wt 147.8 lb

## 2021-01-13 VITALS — BP 121/61 | HR 75 | Temp 97.2°F | Resp 18

## 2021-01-13 DIAGNOSIS — C787 Secondary malignant neoplasm of liver and intrahepatic bile duct: Secondary | ICD-10-CM

## 2021-01-13 DIAGNOSIS — C184 Malignant neoplasm of transverse colon: Secondary | ICD-10-CM | POA: Diagnosis not present

## 2021-01-13 DIAGNOSIS — Z5112 Encounter for antineoplastic immunotherapy: Secondary | ICD-10-CM | POA: Insufficient documentation

## 2021-01-13 DIAGNOSIS — Z79899 Other long term (current) drug therapy: Secondary | ICD-10-CM | POA: Insufficient documentation

## 2021-01-13 DIAGNOSIS — C189 Malignant neoplasm of colon, unspecified: Secondary | ICD-10-CM

## 2021-01-13 DIAGNOSIS — E064 Drug-induced thyroiditis: Secondary | ICD-10-CM | POA: Diagnosis not present

## 2021-01-13 DIAGNOSIS — C7800 Secondary malignant neoplasm of unspecified lung: Secondary | ICD-10-CM | POA: Diagnosis not present

## 2021-01-13 DIAGNOSIS — Z95828 Presence of other vascular implants and grafts: Secondary | ICD-10-CM

## 2021-01-13 DIAGNOSIS — C773 Secondary and unspecified malignant neoplasm of axilla and upper limb lymph nodes: Secondary | ICD-10-CM | POA: Diagnosis not present

## 2021-01-13 LAB — CBC WITH DIFFERENTIAL/PLATELET
Abs Immature Granulocytes: 0.04 10*3/uL (ref 0.00–0.07)
Basophils Absolute: 0 10*3/uL (ref 0.0–0.1)
Basophils Relative: 0 %
Eosinophils Absolute: 0.1 10*3/uL (ref 0.0–0.5)
Eosinophils Relative: 1 %
HCT: 43.8 % (ref 36.0–46.0)
Hemoglobin: 13.9 g/dL (ref 12.0–15.0)
Immature Granulocytes: 0 %
Lymphocytes Relative: 15 %
Lymphs Abs: 1.4 10*3/uL (ref 0.7–4.0)
MCH: 29.8 pg (ref 26.0–34.0)
MCHC: 31.7 g/dL (ref 30.0–36.0)
MCV: 93.8 fL (ref 80.0–100.0)
Monocytes Absolute: 0.9 10*3/uL (ref 0.1–1.0)
Monocytes Relative: 10 %
Neutro Abs: 6.5 10*3/uL (ref 1.7–7.7)
Neutrophils Relative %: 74 %
Platelets: 213 10*3/uL (ref 150–400)
RBC: 4.67 MIL/uL (ref 3.87–5.11)
RDW: 14.7 % (ref 11.5–15.5)
WBC: 8.9 10*3/uL (ref 4.0–10.5)
nRBC: 0 % (ref 0.0–0.2)

## 2021-01-13 LAB — COMPREHENSIVE METABOLIC PANEL
ALT: 17 U/L (ref 0–44)
AST: 24 U/L (ref 15–41)
Albumin: 4.3 g/dL (ref 3.5–5.0)
Alkaline Phosphatase: 77 U/L (ref 38–126)
Anion gap: 9 (ref 5–15)
BUN: 13 mg/dL (ref 8–23)
CO2: 26 mmol/L (ref 22–32)
Calcium: 9.5 mg/dL (ref 8.9–10.3)
Chloride: 103 mmol/L (ref 98–111)
Creatinine, Ser: 0.66 mg/dL (ref 0.44–1.00)
GFR, Estimated: >60 mL/min (ref >60)
Glucose, Bld: 106 mg/dL — ABNORMAL HIGH (ref 70–99)
Potassium: 4.5 mmol/L (ref 3.5–5.1)
Sodium: 138 mmol/L (ref 135–145)
Total Bilirubin: 0.6 mg/dL (ref 0.3–1.2)
Total Protein: 8 g/dL (ref 6.5–8.1)

## 2021-01-13 LAB — MAGNESIUM: Magnesium: 2.2 mg/dL (ref 1.7–2.4)

## 2021-01-13 MED ORDER — SODIUM CHLORIDE 0.9% FLUSH
10.0000 mL | INTRAVENOUS | Status: DC | PRN
  Administered 2021-01-13: 10 mL

## 2021-01-13 MED ORDER — HEPARIN SOD (PORK) LOCK FLUSH 100 UNIT/ML IV SOLN
500.0000 [IU] | Freq: Once | INTRAVENOUS | Status: AC | PRN
  Administered 2021-01-13: 500 [IU]

## 2021-01-13 MED ORDER — SODIUM CHLORIDE 0.9 % IV SOLN
Freq: Once | INTRAVENOUS | Status: AC
Start: 2021-01-13 — End: 2021-01-13

## 2021-01-13 MED ORDER — SODIUM CHLORIDE 0.9 % IV SOLN
200.0000 mg | Freq: Once | INTRAVENOUS | Status: AC
  Administered 2021-01-13: 200 mg via INTRAVENOUS
  Filled 2021-01-13: qty 8

## 2021-01-13 NOTE — Patient Instructions (Signed)
[facility]  Discharge Instructions: Thank you for choosing [facility] to provide your oncology and hematology care.  If you have a lab appointment with the Cancer Center, please come in thru the Main Entrance and check in at the main information desk.  Wear comfortable clothing and clothing appropriate for easy access to any Portacath or PICC line.   We strive to give you quality time with your provider. You may need to reschedule your appointment if you arrive late (15 or more minutes).  Arriving late affects you and other patients whose appointments are after yours.  Also, if you miss three or more appointments without notifying the office, you may be dismissed from the clinic at the provider's discretion.      For prescription refill requests, have your pharmacy contact our office and allow 72 hours for refills to be completed.    Today you received the following chemotherapy and/or immunotherapy agents Keytruda       To help prevent nausea and vomiting after your treatment, we encourage you to take your nausea medication as directed.  BELOW ARE SYMPTOMS THAT SHOULD BE REPORTED IMMEDIATELY: *FEVER GREATER THAN 100.4 F (38 C) OR HIGHER *CHILLS OR SWEATING *NAUSEA AND VOMITING THAT IS NOT CONTROLLED WITH YOUR NAUSEA MEDICATION *UNUSUAL SHORTNESS OF BREATH *UNUSUAL BRUISING OR BLEEDING *URINARY PROBLEMS (pain or burning when urinating, or frequent urination) *BOWEL PROBLEMS (unusual diarrhea, constipation, pain near the anus) TENDERNESS IN MOUTH AND THROAT WITH OR WITHOUT PRESENCE OF ULCERS (sore throat, sores in mouth, or a toothache) UNUSUAL RASH, SWELLING OR PAIN  UNUSUAL VAGINAL DISCHARGE OR ITCHING   Items with * indicate a potential emergency and should be followed up as soon as possible or go to the Emergency Department if any problems should occur.  Please show the CHEMOTHERAPY ALERT CARD or IMMUNOTHERAPY ALERT CARD at check-in to the Emergency  Department and triage nurse.  Should you have questions after your visit or need to cancel or reschedule your appointment, please contact [facility] [phone]  and follow the prompts.  Office hours are 8:00 a.m. to 4:30 p.m. Monday - Friday. Please note that voicemails left after 4:00 p.m. may not be returned until the following business day.  We are closed weekends and major holidays. You have access to a nurse at all times for urgent questions. Please call the main number to the clinic [phone] and follow the prompts.  For any non-urgent questions, you may also contact your provider using MyChart. We now offer e-Visits for anyone 18 and older to request care online for non-urgent symptoms. For details visit [email or web address].   Also download the MyChart app! Go to the app store, search "MyChart", open the app, select [place], and log in with your MyChart username and password.  Due to Covid, a mask is required upon entering the hospital/clinic. If you do not have a mask, one will be given to you upon arrival. For doctor visits, patients may have 1 support person aged 18 or older with them. For treatment visits, patients cannot have anyone with them due to current Covid guidelines and our immunocompromised population.  

## 2021-01-13 NOTE — Progress Notes (Signed)
Nutrition Assessment   Reason for Assessment: RN request   ASSESSMENT: 75 year old female with colon cancer metastatic to liver, lung, and lymph. She is s/p right hemicolectomy 12/29/13. Patient is receiving pembrolizumab  Met with patient in infusion. She reports ongoing diarrhea over the last few months. Patient reports this is improving with Imodium which she is taking TID. Patient reports getting up several times throughout the night secondary to diarrhea the other evening. Husband recalls they had eaten [employer] that night. Wife reports most meals at home are baked or grilled. She is drinking one Boost daily, reports liking the taste of these and tolerates well.    Medications: Zofran, D3, Fish oil, MVI   Labs: Glucose 106   Anthropometrics: Weight 147 lb 12.8 oz today decreased ~6 lbs ( 3.6%) in the last month. This is insignificant for time frame.   Height: 5'4" Weight: 67 kg  UBW: 156 lb (Feb 2022) BMI: 25.37   NUTRITION DIAGNOSIS: Unintentional weight loss related to cancer and associated treatments side effects as evidenced by diarrhea and 6 lb weight loss in 1 month.    INTERVENTION:  Discussed strategies for diarrhea (educated on foods to avoid/limit, incorporating foods with pectin to firm up  stool, encouraged small frequent meals, laying down after eating) - handout provided Discussed importance of hydration, recipe for electrolyte solution provided Recommended drinking 2 Boost Plus/equivalent daly (360 kcal, 14 g protein) for added calories and protein Boost coupons given RD contact information provided    MONITORING, EVALUATION, GOAL: Patient will tolerate increased calories and protein to minimize weight loss   Next Visit: Thursday July 28 in infusion

## 2021-01-13 NOTE — Progress Notes (Signed)
Patient presents today for Day 1 Cycle 1 of Pembrolizumab. Patient assessed and labs reviewed by Dr. Delton Coombes. Consent signed.   Patient tolerated Pembrolizumab with no complaints voiced. Side effects with management reviewed understanding verbalized. Port site clean and dry with no bruising or swelling noted at site. Good blood return noted before and after administration of chemotherapy. Band aid applied. Patient left in satisfactory condition with VSS and no s/s of distress noted.

## 2021-01-13 NOTE — Progress Notes (Signed)
Patient has been assessed, vital signs and labs have been reviewed by Dr. Katragadda. ANC, Creatinine, LFTs, and Platelets are within treatment parameters per Dr. Katragadda. The patient is good to proceed with treatment at this time. Primary RN and pharmacy aware.  

## 2021-01-13 NOTE — Progress Notes (Signed)
Amanda Norris, [place] [address]   CLINIC:  Medical Oncology/Hematology  PCP:  [facility], [place] / [place] [address] [phone]   REASON FOR VISIT:  Follow-up for transverse colon adenocarcinoma  PRIOR THERAPY:  1. Right hemicolectomy on 12/29/2013. 2. FOLFOX x 3 cycles from 02/10/2014 to 03/10/2014, stopped secondary to poor tolerance.  NGS Results: 12/03/2020: MSI high, MMR deficient, BRAF V600E, TMB high   CURRENT THERAPY: Beryle Flock q 3wk  BRIEF ONCOLOGIC HISTORY:  Oncology History  Adenocarcinoma of transverse colon ([facility]) (Resolved)  12/09/2013 Initial Diagnosis   1. Colon, biopsy, proximal transverse mass INVASIVE ADENOCARCINOMA.   12/29/2013 Definitive Surgery   Colon, segmental resection for tumor, right - INVASIVE COLORECTAL ADENOCARCINOMA, 7 CM, EXTENDING INTO PERICOLONIC CONNECTIVE TISSUE. - METASTATIC CARCINOMA IN 6 OF 33 LYMPH NODES (6/33).   02/10/2014 - 03/10/2014 Adjuvant Chemotherapy   FOLFOX x 3 cycles   03/11/2014 Adverse Reaction   Patient called reporting diarrhea despite dose reduction and she wants to stop therapy.  Patient seen on 03/24/14 to discuss other treatment options and she stands by her decision to stop all therapy.   04/01/2014 Surgery   Port-a-cath removal by Dr. Arnoldo Morale.   01/12/2015 PET scan   Mild abdominal mesenteric LAD show hypermetabolic activity, 7 mm indeterminate pulm nodule in sup segment of RLL shows no assoc metabolic activity   2/37/6283 PET scan   Mild progression of nodal mets in anterior mid abdominal mesentery, new nodal mets at L thoracic inlet. stable 6 mm nodule in posterior RLL, unchanged since 2015   06/22/2015 PET scan   Resolution of metabolic activity and decreased size of mild LAD at L thoracic inlet, stable mild mid abd hypermetabolic mesenteric LAD, stable 6 mm posterior LLL pulm nodule without metabolic activity   1/51/7616 Imaging   MRI lumbar spine  L4-L5 disc degenerated, broad based disc hernation, B facet arthropathy with hypertophy and edema, stenosis of lateral recesses that could cause neural compression   10/18/2016 Imaging   CT CAP- 1. Several small ground-glass pulmonary nodules are present in the lungs and are stable over the past 9 months, but several are mildly larger than they were in 2015. Low-grade adenocarcinoma can sometimes have this appearance and surveillance is likely warranted. 2. There is a cluster of nodal tissue in the central mesentery, maximum short axis diameter 1.3 cm. This nodal cluster is relatively low in density and not appreciably changed from the prior exam. Given the lack of change is may represent quiescent residua of malignancy, but again, surveillance is likely warranted. 3. The left-sided rib fractures are more numerous than was revealed on the prior radiographs ; there are nondisplaced healing fractures of the left fifth, sixth, seventh, eighth, ninth, and tenth ribs. 4. Mild cardiomegaly although part of this appearance may be due to pectus excavatum. 5. Several additional small pulmonary nodules are stable from 2015 and considered benign. 6. Right hemicolectomy and sigmoid colon diverticulosis. 7. Small right paraumbilical hernia containing adipose tissue. 8. Lower lumbar spondylosis and degenerative disc disease potentially with mild impingement at L4-5.   04/25/2017 Imaging   CT C/A/P: Scattered solid pulmonary nodules measure up to 6 mm in the right lower lobe, unchanged. There are a few scattered ground-glass nodules measuring up to 8 mm in the right upper lobe, also stable. Distal perigastric lymph nodes are stable. No additional evidence of metastatic disease.   Metastatic colon cancer to liver ([facility])  11/04/2020 Initial Diagnosis  Metastatic colon cancer to liver ([facility])   11/10/2020 - 12/10/2020 Chemotherapy         01/13/2021 -  Chemotherapy    Patient is on Treatment Plan:  COLORECTAL PEMBROLIZUMAB Q21D        CANCER STAGING: Cancer Staging No matching staging information was found for the patient.  Virtual Visit via Video Note  I connected with Amanda Norris on 01/13/21 at 10:00 AM EDT by a video enabled telemedicine application and verified that I am speaking with the correct person using two identifiers.        Location: Patient: In the office Provider: At home   I discussed the limitations of evaluation and management by telemedicine and the availability of in person appointments. The patient expressed understanding and agreed to proceed.  INTERVAL HISTORY:  Amanda Norris, a 75 y.o. female, seen for follow-up of her metastatic colon cancer.  She reports that she has enjoyed her 3 weeks of vacation.  She reports slightly decreased energy levels today.  Denies any abdominal pain.  She lost about 2 pounds in the last 3 weeks.  She reports eating 2 meals per day which is highly.  She reports eating normal portions.  At baseline diarrhea has been stable.  She has up to 2-3 almost watery bowel movements per day.  She takes Imodium twice daily.  On her worst days she could have up to 4 watery bowel movements /day.  She reports pain in her back of the neck going towards the base of the skull whenever she tries to turn towards the left.  She reports having surgery in the neck with a discectomy many years ago.  Denies any weakness in the extremities.   REVIEW OF SYSTEMS:  Review of Systems  Constitutional:  Positive for appetite change (moderate and stable) and fatigue (mild fatigue today).  HENT:   Negative for nosebleeds.   Gastrointestinal:  Positive for diarrhea (2-3/day baseline). Negative for abdominal pain.  Neurological:  Positive for numbness.  Hematological:  Does not bruise/bleed easily.  All other systems reviewed and are negative.  PAST MEDICAL/SURGICAL HISTORY:  Past Medical History:  Diagnosis Date   Arthritis    wrist and thumbs   Colon  cancer (HCC)    PONV (postoperative nausea and vomiting)    Port-A-Cath in place 11/09/2020   Ruptured lumbar disc    L1-L5 per patient report   Sciatica of left side    Past Surgical History:  Procedure Laterality Date   COLONOSCOPY N/A 12/12/2013   Procedure: COLONOSCOPY;  Surgeon: Rogene Houston, MD;  [facility];  Service: Endoscopy;  Laterality: N/A;  730   COLONOSCOPY N/A 01/20/2015   Procedure: COLONOSCOPY;  Surgeon: Rogene Houston, MD;  [facility];  Service: Endoscopy;  Laterality: N/A;  1030   COLONOSCOPY N/A 03/28/2018   Procedure: COLONOSCOPY;  Surgeon: Rogene Houston, MD;  [facility];  Service: Endoscopy;  Laterality: N/A;  1015   EUS N/A 12/18/2013   Procedure: UPPER ENDOSCOPIC ULTRASOUND (EUS) LINEAR;  Surgeon: Milus Banister, MD;  Location: [facility];  Service: Endoscopy;  Laterality: N/A;   herniated disc     [id number]   IR IMAGING GUIDED PORT INSERTION  10/14/2020   IR US GUIDANCE  10/14/2020   PARTIAL COLECTOMY N/A 12/29/2013   Procedure: RIGHT HEMICOLECTOMY;  Surgeon: Jamesetta So, MD;  Location: [facility];  Service: General;  Laterality: N/A;   POLYPECTOMY  03/28/2018  Procedure: POLYPECTOMY;  Surgeon: Rogene Houston, MD;  [facility];  Service: Endoscopy;;  transverse colon (hot snare);   PORT-A-CATH REMOVAL Right 04/01/2014   Procedure: MINOR REMOVAL PORT-A-CATH;  Surgeon: Jamesetta So, MD;  Location: [facility];  Service: General;  Laterality: Right;   PORTACATH PLACEMENT Right 02/04/2014   Procedure: INSERTION PORT-A-CATH;  Surgeon: Jamesetta So, MD;  Location: [facility];  Service: General;  Laterality: Right;    SOCIAL HISTORY:  Social History   Socioeconomic History   Marital status: Married    Spouse name: Not on file   Number of children: Not on file   Years of education: Not on file   Highest education level: Not on file  Occupational History   Not on file  Tobacco Use   Smoking status: Former     Packs/day: 0.25    Years: 5.00    Pack years: 1.25    Types: Cigarettes    Quit date: 04/19/1959    Years since quitting: 61.7   Smokeless tobacco: Never   Tobacco comments:    smoked for 5 years as teenager  Vaping Use   Vaping Use: Never used  Substance and Sexual Activity   Alcohol use: No   Drug use: No   Sexual activity: Yes    Birth control/protection: Post-menopausal  Other Topics Concern   Not on file  Social History Narrative   Not on file   Social Determinants of Health   Financial Resource Strain: Low Risk    Difficulty of Paying Living Expenses: Not hard at all  Food Insecurity: No Food Insecurity   Worried About Charity fundraiser in the Last Year: Never true   [place] in the Last Year: Never true  Transportation Needs: No Transportation Needs   Lack of Transportation (Medical): No   Lack of Transportation (Non-Medical): No  Physical Activity: Inactive   Days of Exercise per Week: 0 days   Minutes of Exercise per Session: 0 min  Stress: Stress Concern Present   Feeling of Stress : To some extent  Social Connections: Engineer, building services of Communication with Friends and Family: More than three times a week   Frequency of Social Gatherings with Friends and Family: More than three times a week   Attends Religious Services: More than 4 times per year   Active Member of [employer] or Organizations: Yes   Attends Music therapist: More than 4 times per year   Marital Status: Married  Human resources officer Violence: Not At Risk   Fear of Current or Ex-Partner: No   Emotionally Abused: No   Physically Abused: No   Sexually Abused: No    FAMILY HISTORY:  Family History  Problem Relation Age of Onset   Diabetes type II Mother    Dementia Father     CURRENT MEDICATIONS:  Current Outpatient Medications  Medication Sig Dispense Refill   acetaminophen (TYLENOL) 500 MG tablet Take 500 mg by mouth every 6 (six) hours as needed for  moderate pain or headache.     aspirin 81 MG EC tablet Take 1 tablet (81 mg total) by mouth daily. Swallow whole. 30 tablet 12   Bioflavonoid Products (ESTER C PO) Take 1,000 mg by mouth daily.     Cholecalciferol (D3-1000) 25 MCG (1000 UT) capsule Take 125 Units by mouth daily.     Cranberry-Vitamin C-Vitamin E 4200-20-3 MG-MG-UNIT CAPS Take 500 mg by mouth daily.  Garlic 3013 MG CAPS Take 1,000 mg by mouth daily.      Multiple Vitamin (MULTIVITAMIN) tablet Take 1 tablet by mouth daily.     Omega-3 Fatty Acids (FISH OIL) 1200 MG CAPS Take 1,000 mg by mouth daily.     ondansetron (ZOFRAN ODT) 8 MG disintegrating tablet Take 1 tablet (8 mg total) by mouth every 8 (eight) hours as needed for nausea or vomiting. 30 tablet 3   No current facility-administered medications for this visit.    ALLERGIES:  Allergies  Allergen Reactions   Sulfa Antibiotics Other (See Comments)    "Sores in my mouth"    PHYSICAL EXAM:  Performance status (ECOG): 1 - Symptomatic but completely ambulatory  Vitals:   01/13/21 0900  BP: (!) 151/70  Pulse: 66  Resp: 18  Temp: (!) 97.2 F (36.2 C)  SpO2: 99%   Wt Readings from Last 3 Encounters:  01/13/21 147 lb 12.8 oz (67 kg)  12/22/20 149 lb 3.2 oz (67.7 kg)  12/08/20 152 lb 8 oz (69.2 kg)   Physical Exam Vitals reviewed.  Constitutional:      Appearance: Normal appearance.  Musculoskeletal:     Right lower leg: No edema.     Left lower leg: No edema.  Neurological:     Mental Status: She is alert and oriented to person, place, and time.  Psychiatric:        Mood and Affect: Mood normal.        Behavior: Behavior normal.    LABORATORY DATA:  I have reviewed the labs as listed.  CBC Latest Ref Rng & Units 01/13/2021 12/22/2020 12/08/2020  WBC 4.0 - 10.5 K/uL 8.9 6.9 6.9  Hemoglobin 12.0 - 15.0 g/dL 13.9 13.6 12.1  Hematocrit 36.0 - 46.0 % 43.8 43.2 38.7  Platelets 150 - 400 K/uL 213 185 187   CMP Latest Ref Rng & Units 01/13/2021 12/22/2020  12/08/2020  Glucose 70 - 99 mg/dL 106(H) 104(H) 112(H)  BUN 8 - 23 mg/dL 13 15 18   Creatinine 0.44 - 1.00 mg/dL 0.66 0.66 0.61  Sodium 135 - 145 mmol/L 138 136 136  Potassium 3.5 - 5.1 mmol/L 4.5 3.8 3.9  Chloride 98 - 111 mmol/L 103 101 102  CO2 22 - 32 mmol/L 26 27 26   Calcium 8.9 - 10.3 mg/dL 9.5 9.3 9.1  Total Protein 6.5 - 8.1 g/dL 8.0 7.8 7.4  Total Bilirubin 0.3 - 1.2 mg/dL 0.6 0.3 0.3  Alkaline Phos 38 - 126 U/L 77 67 64  AST 15 - 41 U/L 24 20 19   ALT 0 - 44 U/L 17 15 17     DIAGNOSTIC IMAGING:  I have independently reviewed the scans and discussed with the patient. No results found.   ASSESSMENT:  1.  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 - Status post 3 cycles of FOLFOX from 02/10/2014 through 03/10/2014, discontinued secondary to poor tolerance.  DPD was negative. -Last colonoscopy on 03/28/2018 patent ileo-colonic anastomosis.  8 mm polyp in the transverse colon.  Diverticulosis in the sigmoid colon, descending colon. - CT scan on 11/04/2018 shows postoperative findings of right colectomy with redemonstrated mesenteric nodule/lymph node measuring 1.9 x 1.7 cm, unchanged from prior scan in October 2019.  No evidence of metastatic disease.  Stable solid and groundglass pulmonary nodules, stable over multiple prior exams. -CEA was 4.6 on 10/28/2018. -CTAP on 09/21/2020 showed numerous  small pulmonary nodules throughout the lung bases, 5 mm.  Enlarged retrocrural periaortic lymph node in the lower chest.  Soft tissue nodule within the central mesentery substantially increased in size, encasing proximal superior mesenteric artery and measuring 5 x 4.3 cm.  Numerous retroperitoneal lymph nodes, largest aortocaval lymph node measuring 2.8 x 2.2 cm. -CT chest on 09/27/2020 shows a left thoracic inlet lymph node approximately 2.3 cm.  Mediastinal  lymph nodes and multiple subcentimeter pulmonary nodules suggestive of metastatic disease. -Mesenteric lymph node biopsy on 09/28/2020 shows mucin and fibrosis. -Left supraclavicular lymph node biopsy on 10/14/2020-soft tissue with abundant mucin, rare fragments of atypical columnar epithelium. - 3 cycles of FOLFIRI and bevacizumab dose reduced from 11/10/2020 through 12/08/2020. - [facility] test-BRAF V600 E+, MMR deficient, MSI high, TMB high, HER2 negative, BRCA1/2 pathogenic variant on exon 14 and exon 9 respectively.  The report also suggested decreased benefit to FOLFOX and bevacizumab in the first-line metastatic setting. - Cycle 1 of Keytruda on 01/13/2021.   2.  Health maintenance: -Mammogram dated 05/16/2018 was BI-RADS Category 1.  PLAN:  1.  Metastatic colon cancer to the liver, lungs and lymph nodes: -Reviewed her labs from today which shows normal CBC and LFTs.  Electrolytes are normal. - We talked about starting her on immunotherapy with Keytruda every 3 weeks given her MSI high status. - We talked about, immunotherapy related side effects including mild fatigue, skin dryness and itching, thyroid function abnormalities.  We also talked about rare but serious side effects including less than 5% incidence of colitis, pneumonitis, hypophysitis and more severe skin rashes. - We will proceed with her first cycle of Keytruda today. - We will obtain CT CAP with contrast and a CEA level prior to next visit in 3 weeks.  We will also check TSH level. - She reports pain in the neck radiating to the base of the skull, on rotating her head to the left side.  She also had previous history of neck surgery.  I have recommended x-ray of her C-spine multiple views.   2.  Diarrhea: - She has 2-3 almost watery bowel movements per day.  She takes Imodium twice daily.  On a bad day she can have up to 4 watery bowel movements per day.  This is stable and was present even prior to start of chemotherapy.   3.   Abdominal pain: - This has completely resolved after first cycle of chemotherapy.   4.  Nutrition: - She lost 2 pounds in the last 2 weeks.  She reports eating 2 meals per day which is her baseline.  She also reports eating the same portion size. - I have recommended to drink at least 1 can of boost or Ensure per day.   Orders placed this encounter:  No orders of the defined types were placed in this encounter.  I provided 30 minutes of non-face-to-face time during this encounter.  Derek Jack, MD [place] [phone]

## 2021-01-13 NOTE — Patient Instructions (Addendum)
[place] at [facility] Discharge Instructions  You were seen today by Dr. Delton Coombes. He discussed your recent lab work and everything looks good. He discussed your recent weight loss. He recommends that you drink one can of boost/ensure/premier protein daily to help with weight loss. Dr. Delton Coombes does not want you to lose anymore weight. Dr. Delton Coombes discussed that testing showed that immunotherapy is the best option for you. Beryle Flock is the immunotherapy that you will be started on. Dr. Delton Coombes discussed the side effects of Keytruda with you. Dr. Delton Coombes will keep a check on your thyroid labs while you are undergoing immunotherapy since Keytruda can cause thyroid issues. If you have multiple watery bowel movements you need to go to the Emergency Department.  You received your treatment today. Please follow up as scheduled.   Thank you for choosing [place] at [facility] to provide your oncology and hematology care.  To afford each patient quality time with our provider, please arrive at least 15 minutes before your scheduled appointment time.   If you have a lab appointment with the [facility] please come in thru the Main Entrance and check in at the main information desk.  You need to re-schedule your appointment should you arrive 10 or more minutes late.  We strive to give you quality time with our providers, and arriving late affects you and other patients whose appointments are after yours.  Also, if you no show three or more times for appointments you may be dismissed from the clinic at the providers discretion.     Again, thank you for choosing [facility].  Our hope is that these requests will decrease the amount of time that you wait before being seen by our physicians.       _____________________________________________________________  Should you have questions after your visit to [facility],  please contact our office at [phone] and follow the prompts.  Our office hours are 8:00 a.m. and 4:30 p.m. Monday - Friday.  Please note that voicemails left after 4:00 p.m. may not be returned until the following business day.  We are closed weekends and major holidays.  You do have access to a nurse 24-7, just call the main number to the clinic [phone] and do not press any options, hold on the line and a nurse will answer the phone.    For prescription refill requests, have your pharmacy contact our office and allow 72 hours.    Due to Covid, you will need to wear a mask upon entering the hospital. If you do not have a mask, a mask will be given to you at the Main Entrance upon arrival. For doctor visits, patients may have 1 support person age 45 or older with them. For treatment visits, patients can not have anyone with them due to social distancing guidelines and our immunocompromised population.

## 2021-01-14 LAB — CEA: CEA: 49.8 ng/mL — ABNORMAL HIGH (ref 0.0–4.7)

## 2021-01-14 NOTE — Progress Notes (Signed)
Patient called for 24-hour follow-up. No complaints voiced at this time.

## 2021-01-31 ENCOUNTER — Ambulatory Visit (HOSPITAL_COMMUNITY)
Admission: RE | Admit: 2021-01-31 | Discharge: 2021-01-31 | Disposition: A | Discharge status: Home / Self Care | Payer: Medicare Other | Source: Ambulatory Visit | Attending: Hematology | Admitting: Hematology

## 2021-01-31 ENCOUNTER — Other Ambulatory Visit: Payer: Self-pay

## 2021-01-31 DIAGNOSIS — C787 Secondary malignant neoplasm of liver and intrahepatic bile duct: Secondary | ICD-10-CM | POA: Insufficient documentation

## 2021-01-31 DIAGNOSIS — C184 Malignant neoplasm of transverse colon: Secondary | ICD-10-CM | POA: Diagnosis not present

## 2021-01-31 DIAGNOSIS — R590 Localized enlarged lymph nodes: Secondary | ICD-10-CM | POA: Diagnosis not present

## 2021-01-31 DIAGNOSIS — C189 Malignant neoplasm of colon, unspecified: Secondary | ICD-10-CM | POA: Diagnosis not present

## 2021-01-31 DIAGNOSIS — I700 Atherosclerosis of aorta: Secondary | ICD-10-CM | POA: Diagnosis not present

## 2021-01-31 DIAGNOSIS — K769 Liver disease, unspecified: Secondary | ICD-10-CM | POA: Diagnosis not present

## 2021-01-31 DIAGNOSIS — R918 Other nonspecific abnormal finding of lung field: Secondary | ICD-10-CM | POA: Diagnosis not present

## 2021-01-31 MED ORDER — IOHEXOL 350 MG/ML SOLN
100.0000 mL | Freq: Once | INTRAVENOUS | Status: AC | PRN
  Administered 2021-01-31: 80 mL via INTRAVENOUS

## 2021-02-02 ENCOUNTER — Ambulatory Visit (HOSPITAL_COMMUNITY): Payer: Medicare Other

## 2021-02-02 DIAGNOSIS — H35363 Drusen (degenerative) of macula, bilateral: Secondary | ICD-10-CM | POA: Diagnosis not present

## 2021-02-02 DIAGNOSIS — H43822 Vitreomacular adhesion, left eye: Secondary | ICD-10-CM | POA: Diagnosis not present

## 2021-02-02 DIAGNOSIS — H2513 Age-related nuclear cataract, bilateral: Secondary | ICD-10-CM | POA: Diagnosis not present

## 2021-02-02 DIAGNOSIS — H25043 Posterior subcapsular polar age-related cataract, bilateral: Secondary | ICD-10-CM | POA: Diagnosis not present

## 2021-02-02 DIAGNOSIS — H2511 Age-related nuclear cataract, right eye: Secondary | ICD-10-CM | POA: Diagnosis not present

## 2021-02-02 DIAGNOSIS — H25013 Cortical age-related cataract, bilateral: Secondary | ICD-10-CM | POA: Diagnosis not present

## 2021-02-02 NOTE — Progress Notes (Signed)
Amanda Norris, [place] [address]   CLINIC:  Medical Oncology/Hematology  PCP:  [facility], [place] / [place] [address] [phone]   REASON FOR VISIT:  Follow-up for transverse colon adenocarcinoma  PRIOR THERAPY:  1. Right hemicolectomy on 12/29/2013. 2. FOLFOX x 3 cycles from 02/10/2014 to 03/10/2014, stopped secondary to poor tolerance.  NGS Results: MSI high, MMR deficient, BRAF V600E, TMB high  CURRENT THERAPY: Beryle Flock q 3wk  BRIEF ONCOLOGIC HISTORY:  Oncology History  Adenocarcinoma of transverse colon ([place]) (Resolved)  12/09/2013 Initial Diagnosis   1. Colon, biopsy, proximal transverse mass INVASIVE ADENOCARCINOMA.    12/29/2013 Definitive Surgery   Colon, segmental resection for tumor, right - INVASIVE COLORECTAL ADENOCARCINOMA, 7 CM, EXTENDING INTO PERICOLONIC CONNECTIVE TISSUE. - METASTATIC CARCINOMA IN 6 OF 33 LYMPH NODES (6/33).    02/10/2014 - 03/10/2014 Adjuvant Chemotherapy   FOLFOX x 3 cycles    03/11/2014 Adverse Reaction   Patient called reporting diarrhea despite dose reduction and she wants to stop therapy.  Patient seen on 03/24/14 to discuss other treatment options and she stands by her decision to stop all therapy.    04/01/2014 Surgery   Port-a-cath removal by Dr. Arnoldo Morale.    01/12/2015 PET scan   Mild abdominal mesenteric LAD show hypermetabolic activity, 7 mm indeterminate pulm nodule in sup segment of RLL shows no assoc metabolic activity    6/43/3295 PET scan   Mild progression of nodal mets in anterior mid abdominal mesentery, new nodal mets at L thoracic inlet. stable 6 mm nodule in posterior RLL, unchanged since 2015    06/22/2015 PET scan   Resolution of metabolic activity and decreased size of mild LAD at L thoracic inlet, stable mild mid abd hypermetabolic mesenteric LAD, stable 6 mm posterior LLL pulm nodule without metabolic activity    1/88/4166 Imaging   MRI lumbar spine  L4-L5 disc degenerated, broad based disc hernation, B facet arthropathy with hypertophy and edema, stenosis of lateral recesses that could cause neural compression    10/18/2016 Imaging   CT CAP- 1. Several small ground-glass pulmonary nodules are present in the lungs and are stable over the past 9 months, but several are mildly larger than they were in 2015. Low-grade adenocarcinoma can sometimes have this appearance and surveillance is likely warranted. 2. There is a cluster of nodal tissue in the central mesentery, maximum short axis diameter 1.3 cm. This nodal cluster is relatively low in density and not appreciably changed from the prior exam. Given the lack of change is may represent quiescent residua of malignancy, but again, surveillance is likely warranted. 3. The left-sided rib fractures are more numerous than was revealed on the prior radiographs ; there are nondisplaced healing fractures of the left fifth, sixth, seventh, eighth, ninth, and tenth ribs. 4. Mild cardiomegaly although part of this appearance may be due to pectus excavatum. 5. Several additional small pulmonary nodules are stable from 2015 and considered benign. 6. Right hemicolectomy and sigmoid colon diverticulosis. 7. Small right paraumbilical hernia containing adipose tissue. 8. Lower lumbar spondylosis and degenerative disc disease potentially with mild impingement at L4-5.    04/25/2017 Imaging   CT C/A/P: Scattered solid pulmonary nodules measure up to 6 mm in the right lower lobe, unchanged. There are a few scattered ground-glass nodules measuring up to 8 mm in the right upper lobe, also stable. Distal perigastric lymph nodes are stable. No additional evidence of metastatic disease.   Metastatic colon  cancer to liver [facility])  11/04/2020 Initial Diagnosis   Metastatic colon cancer to liver ([place])    11/10/2020 - 12/10/2020 Chemotherapy          01/13/2021 -  Chemotherapy    Patient is on Treatment Plan:  COLORECTAL PEMBROLIZUMAB Q21D         CANCER STAGING: Cancer Staging No matching staging information was found for the patient.  INTERVAL HISTORY:  Amanda Norris, a 75 y.o. female, returns for routine follow-up and consideration for next cycle of immunotherapy. [name] was last seen on 01/13/21.  Due for cycle #2 of Keytruda today.   Overall, she tells me she has been feeling pretty well. She reports watery diarrhea 3-4 times daily over the past year which has been helped slightly with imodium. She reports good appetite and denies abdominal pain, nausea, and vomiting. She is scheduled for cataract surgery in September. She is drinking 1-2 cans of Boost daily.   Overall, she feels ready for next cycle of immunotherapy today.   REVIEW OF SYSTEMS:  Review of Systems  Constitutional:  Positive for appetite change (60%) and fatigue (50%).  Gastrointestinal:  Positive for diarrhea. Negative for abdominal pain, nausea and vomiting.  Psychiatric/Behavioral:  Positive for depression.   All other systems reviewed and are negative.  PAST MEDICAL/SURGICAL HISTORY:  Past Medical History:  Diagnosis Date   Arthritis    wrist and thumbs   Colon cancer (HCC)    PONV (postoperative nausea and vomiting)    Port-A-Cath in place 11/09/2020   Ruptured lumbar disc    L1-L5 per patient report   Sciatica of left side    Past Surgical History:  Procedure Laterality Date   COLONOSCOPY N/A 12/12/2013   Procedure: COLONOSCOPY;  Surgeon: Rogene Houston, MD;  [facility];  Service: Endoscopy;  Laterality: N/A;  730   COLONOSCOPY N/A 01/20/2015   Procedure: COLONOSCOPY;  Surgeon: Rogene Houston, MD;  [facility];  Service: Endoscopy;  Laterality: N/A;  1030   COLONOSCOPY N/A 03/28/2018   Procedure: COLONOSCOPY;  Surgeon: Rogene Houston, MD;  [facility];  Service: Endoscopy;  Laterality: N/A;  1015   EUS N/A 12/18/2013   Procedure: UPPER ENDOSCOPIC ULTRASOUND (EUS)  LINEAR;  Surgeon: Milus Banister, MD;  Location: [facility];  Service: Endoscopy;  Laterality: N/A;   herniated disc     1996-[id number]   IR IMAGING GUIDED PORT INSERTION  10/14/2020   IR US GUIDANCE  10/14/2020   PARTIAL COLECTOMY N/A 12/29/2013   Procedure: RIGHT HEMICOLECTOMY;  Surgeon: Jamesetta So, MD;  Location: [facility];  Service: General;  Laterality: N/A;   POLYPECTOMY  03/28/2018   Procedure: POLYPECTOMY;  Surgeon: Rogene Houston, MD;  [facility];  Service: Endoscopy;;  transverse colon (hot snare);   PORT-A-CATH REMOVAL Right 04/01/2014   Procedure: MINOR REMOVAL PORT-A-CATH;  Surgeon: Jamesetta So, MD;  Location: [facility];  Service: General;  Laterality: Right;   PORTACATH PLACEMENT Right 02/04/2014   Procedure: INSERTION PORT-A-CATH;  Surgeon: Jamesetta So, MD;  Location: [facility];  Service: General;  Laterality: Right;    SOCIAL HISTORY:  Social History   Socioeconomic History   Marital status: Married    Spouse name: Not on file   Number of children: Not on file   Years of education: Not on file   Highest education level: Not on file  Occupational History   Not on file  Tobacco Use  Smoking status: Former    Packs/day: 0.25    Years: 5.00    Pack years: 1.25    Types: Cigarettes    Quit date: 04/19/1959    Years since quitting: 61.8   Smokeless tobacco: Never   Tobacco comments:    smoked for 5 years as teenager  Vaping Use   Vaping Use: Never used  Substance and Sexual Activity   Alcohol use: No   Drug use: No   Sexual activity: Yes    Birth control/protection: Post-menopausal  Other Topics Concern   Not on file  Social History Narrative   Not on file   Social Determinants of Health   Financial Resource Strain: Low Risk    Difficulty of Paying Living Expenses: Not hard at all  Food Insecurity: No Food Insecurity   Worried About Charity fundraiser in the Last Year: Never true   Ran Out of Food in the Last Year: Never true  Transportation  Needs: No Transportation Needs   Lack of Transportation (Medical): No   Lack of Transportation (Non-Medical): No  Physical Activity: Inactive   Days of Exercise per Week: 0 days   Minutes of Exercise per Session: 0 min  Stress: Stress Concern Present   Feeling of Stress : To some extent  Social Connections: Engineer, building services of Communication with Friends and Family: More than three times a week   Frequency of Social Gatherings with Friends and Family: More than three times a week   Attends Religious Services: More than 4 times per year   Active Member of [employer] or Organizations: Yes   Attends Music therapist: More than 4 times per year   Marital Status: Married  Human resources officer Violence: Not At Risk   Fear of Current or Ex-Partner: No   Emotionally Abused: No   Physically Abused: No   Sexually Abused: No    FAMILY HISTORY:  Family History  Problem Relation Age of Onset   Diabetes type II Mother    Dementia Father     CURRENT MEDICATIONS:  Current Outpatient Medications  Medication Sig Dispense Refill   acetaminophen (TYLENOL) 500 MG tablet Take 500 mg by mouth every 6 (six) hours as needed for moderate pain or headache.     aspirin 81 MG EC tablet Take 1 tablet (81 mg total) by mouth daily. Swallow whole. 30 tablet 12   Bioflavonoid Products (ESTER C PO) Take 1,000 mg by mouth daily.     Cholecalciferol (D3-1000) 25 MCG (1000 UT) capsule Take 125 Units by mouth daily.     Cranberry-Vitamin C-Vitamin E 4200-20-3 MG-MG-UNIT CAPS Take 500 mg by mouth daily.     Garlic 5465 MG CAPS Take 1,000 mg by mouth daily.      Multiple Vitamin (MULTIVITAMIN) tablet Take 1 tablet by mouth daily.     Omega-3 Fatty Acids (FISH OIL) 1200 MG CAPS Take 1,000 mg by mouth daily.     ondansetron (ZOFRAN ODT) 8 MG disintegrating tablet Take 1 tablet (8 mg total) by mouth every 8 (eight) hours as needed for nausea or vomiting. 30 tablet 3   No current facility-administered  medications for this visit.    ALLERGIES:  Allergies  Allergen Reactions   Sulfa Antibiotics Other (See Comments)    "Sores in my mouth"    PHYSICAL EXAM:  Performance status (ECOG): 1 - Symptomatic but completely ambulatory  There were no vitals filed for this visit. Wt Readings from Last 3 Encounters:  01/13/21 147 lb 12.8 oz (67 kg)  12/22/20 149 lb 3.2 oz (67.7 kg)  12/08/20 152 lb 8 oz (69.2 kg)   Physical Exam Vitals reviewed.  Constitutional:      Appearance: Normal appearance.  Cardiovascular:     Rate and Rhythm: Normal rate and regular rhythm.     Pulses: Normal pulses.     Heart sounds: Normal heart sounds.  Pulmonary:     Effort: Pulmonary effort is normal.     Breath sounds: Normal breath sounds.  Neurological:     General: No focal deficit present.     Mental Status: She is alert and oriented to person, place, and time.  Psychiatric:        Mood and Affect: Mood normal.        Behavior: Behavior normal.    LABORATORY DATA:  I have reviewed the labs as listed.  CBC Latest Ref Rng & Units 01/13/2021 12/22/2020 12/08/2020  WBC 4.0 - 10.5 K/uL 8.9 6.9 6.9  Hemoglobin 12.0 - 15.0 g/dL 13.9 13.6 12.1  Hematocrit 36.0 - 46.0 % 43.8 43.2 38.7  Platelets 150 - 400 K/uL 213 185 187   CMP Latest Ref Rng & Units 01/13/2021 12/22/2020 12/08/2020  Glucose 70 - 99 mg/dL 106(H) 104(H) 112(H)  BUN 8 - 23 mg/dL 13 15 18   Creatinine 0.44 - 1.00 mg/dL 0.66 0.66 0.61  Sodium 135 - 145 mmol/L 138 136 136  Potassium 3.5 - 5.1 mmol/L 4.5 3.8 3.9  Chloride 98 - 111 mmol/L 103 101 102  CO2 22 - 32 mmol/L 26 27 26   Calcium 8.9 - 10.3 mg/dL 9.5 9.3 9.1  Total Protein 6.5 - 8.1 g/dL 8.0 7.8 7.4  Total Bilirubin 0.3 - 1.2 mg/dL 0.6 0.3 0.3  Alkaline Phos 38 - 126 U/L 77 67 64  AST 15 - 41 U/L 24 20 19   ALT 0 - 44 U/L 17 15 17     DIAGNOSTIC IMAGING:  I have independently reviewed the scans and discussed with the patient. CT CHEST ABDOMEN PELVIS W CONTRAST  Result Date:  02/01/2021 CLINICAL DATA:  Metastatic colon cancer restaging, ongoing immunotherapy EXAM: CT CHEST, ABDOMEN, AND PELVIS WITH CONTRAST TECHNIQUE: Multidetector CT imaging of the chest, abdomen and pelvis was performed following the standard protocol during bolus administration of intravenous contrast. CONTRAST:  9m OMNIPAQUE IOHEXOL 350 MG/ML SOLN COMPARISON:  PET-CT, 11/01/2020, CT biopsy 11/09/2020 FINDINGS: CT CHEST FINDINGS Cardiovascular: Right chest port catheter. Normal heart size. No pericardial effusion. Mediastinum/Nodes: Numerous enlarged mediastinal and hilar lymph nodes, several of which are substantially increased in size compared to prior examination, most notably a subcarinal node or conglomerate measuring 3.4 x 1.9 cm, previously no greater than 1.4 x 1.1 cm (series 2, image 32). Thyroid gland, trachea, and esophagus demonstrate no significant findings. Lungs/Pleura: Numerous pulmonary nodules of varying sizes throughout the lungs, generally slightly increased in size compared to prior examination, for example an index nodule in the left lower lobe measuring 1.8 x 1.5 cm, previously 1.4 x 0.9 cm when measured similarly (series 6, image 113). No pleural effusion or pneumothorax. Musculoskeletal: No chest wall mass or suspicious bone lesions identified. CT ABDOMEN PELVIS FINDINGS Hepatobiliary: Redemonstrated hypodense lesion of the anterior midline liver measuring 2.7 x 2.0 cm, not significantly changed (series 2, image 60). No gallstones, gallbladder wall thickening, or biliary dilatation. Pancreas: Unremarkable. No pancreatic ductal dilatation or surrounding inflammatory changes. Spleen: Normal in size without significant abnormality. Adrenals/Urinary Tract: Adrenal glands are unremarkable. Kidneys are normal,  without renal calculi, solid lesion, or hydronephrosis. Bladder is unremarkable. Stomach/Bowel: Stomach is within normal limits. Status post right hemicolectomy and reanastomosis. No  evidence of bowel wall thickening, distention, or inflammatory changes. Descending and sigmoid diverticula. Vascular/Lymphatic: Scattered aortic atherosclerosis. Numerous redemonstrated enlarged retroperitoneal, aortocaval, and celiac axis lymph nodes, as well as numerous enlarged mesenteric lymph nodes and a soft tissue mass about the central small bowel mesentery and SMA. Retroperitoneal nodes are very slightly enlarged compared to prior examination, largest index left retroperitoneal node measuring 3.1 x 2.2 cm, previously 2.8 x 2.0 cm (series 2, image 70). Mesenteric soft tissue mass is slightly enlarged compared to prior examination, measuring 5.8 x 4.5 cm, previously 5.1 x 4.1 cm when measured similarly (series 2, image 74). Reproductive: No mass or other abnormality. Other: No abdominal wall hernia or abnormality. No abdominopelvic ascites. Musculoskeletal: No acute or significant osseous findings. IMPRESSION: 1. Numerous enlarged mediastinal and hilar lymph nodes, several of which are substantially increased in size compared to prior examination. 2. Numerous pulmonary nodules of varying sizes throughout the lungs, generally slightly increased in size compared to prior examination. 3. Numerous redemonstrated enlarged retroperitoneal, aortocaval, and celiac axis lymph nodes, as well as numerous enlarged mesenteric lymph nodes and a soft tissue mass about the central small bowel mesentery and SMA. Lymph nodes and soft tissue mass are slightly enlarged compared to prior examination. 4. Redemonstrated hypodense lesion of the anterior midline liver measuring 2.7 x 2.0 cm, not significantly changed. 5. Overall constellation of findings is consistent with worsened pulmonary, mesenteric, and nodal metastatic disease with unchanged hepatic metastatic disease. 6. Status post right hemicolectomy and reanastomosis. Aortic Atherosclerosis (ICD10-I70.0). Electronically Signed   By: Eddie Candle M.D.   On: 02/01/2021 08:25      ASSESSMENT:  1.  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 - Status post 3 cycles of FOLFOX from 02/10/2014 through 03/10/2014, discontinued secondary to poor tolerance.  DPD was negative. -Last colonoscopy on 03/28/2018 patent ileo-colonic anastomosis.  8 mm polyp in the transverse colon.  Diverticulosis in the sigmoid colon, descending colon. - CT scan on 11/04/2018 shows postoperative findings of right colectomy with redemonstrated mesenteric nodule/lymph node measuring 1.9 x 1.7 cm, unchanged from prior scan in October 2019.  No evidence of metastatic disease.  Stable solid and groundglass pulmonary nodules, stable over multiple prior exams. -CEA was 4.6 on 10/28/2018. -CTAP on 09/21/2020 showed numerous small pulmonary nodules throughout the lung bases, 5 mm.  Enlarged retrocrural periaortic lymph node in the lower chest.  Soft tissue nodule within the central mesentery substantially increased in size, encasing proximal superior mesenteric artery and measuring 5 x 4.3 cm.  Numerous retroperitoneal lymph nodes, largest aortocaval lymph node measuring 2.8 x 2.2 cm. -CT chest on 09/27/2020 shows a left thoracic inlet lymph node approximately 2.3 cm.  Mediastinal lymph nodes and multiple subcentimeter pulmonary nodules suggestive of metastatic disease. -Mesenteric lymph node biopsy on 09/28/2020 shows mucin and fibrosis. -Left supraclavicular lymph node biopsy on 10/14/2020-soft tissue with abundant mucin, rare fragments of atypical columnar epithelium. - 3 cycles of FOLFIRI and bevacizumab dose reduced from 11/10/2020 through 12/08/2020. - Caris test-BRAF V600 E+, MMR deficient, MSI high, TMB high, HER2 negative, BRCA1/2 pathogenic variant on exon 14 and exon 9 respectively.  The report also suggested decreased benefit to FOLFOX and bevacizumab in the first-line  metastatic setting. - Cycle 1 of Keytruda  on 01/13/2021.   2.  Health maintenance: -Mammogram dated 05/16/2018 was BI-RADS Category 1.   PLAN:  1.  Metastatic colon cancer to the liver, lungs and lymph nodes: - She has tolerated first cycle of pembrolizumab very well.  Denied any worsening of baseline diarrhea.  Denied any dry cough or shortness of breath.  No skin rashes reported. - Last CEA was 49.8 on 01/13/2021, up from 30 on 12/22/2020. - Reviewed CT CAP with contrast from 01/31/2021 which showed increased size of mediastinal and hilar lymphadenopathy and numerous lung nodules.  Slight enlargement of retroperitoneal, aortocaval and celiac axis lymph nodes and soft tissue mass about the central small bowel mesentery.  Liver lesion is stable. - The CT scan will serve as a baseline to compare.  We will plan to repeat another scan after 4 cycles of pembrolizumab. - Reviewed her labs today which showed normal CBC and comprehensive metabolic panel.  TSH was 2.05. - We will proceed with second cycle of pembrolizumab today.  She will come back in 3 weeks for cycle 3.  I plan to see her back in 6 weeks prior to cycle 4.   2.  Diarrhea: - She reports that she has about 3-4 watery bowel movements per day for the last 1 year. - Continue Imodium as needed.  We will I have counseled her about immunotherapy associated colitis diarrhea.  She was told to call us if any worsening of her baseline diarrhea.   3.  Abdominal pain: - This has completely resolved after the chemotherapy was initiated.   4.  Nutrition: - Weight has been stable since last visit.  Continue 1 can of boost per day.   Orders placed this encounter:  No orders of the defined types were placed in this encounter.    Derek Jack, MD [place] [phone]   I, Thana Ates, am acting as a scribe for Dr. Derek Jack.  I, Derek Jack MD, have reviewed the above documentation for accuracy and  completeness, and I agree with the above.

## 2021-02-03 ENCOUNTER — Inpatient Hospital Stay (HOSPITAL_COMMUNITY): Payer: Medicare Other | Admitting: Dietician

## 2021-02-03 ENCOUNTER — Inpatient Hospital Stay (HOSPITAL_COMMUNITY): Payer: Medicare Other

## 2021-02-03 ENCOUNTER — Other Ambulatory Visit: Payer: Self-pay

## 2021-02-03 ENCOUNTER — Inpatient Hospital Stay (HOSPITAL_BASED_OUTPATIENT_CLINIC_OR_DEPARTMENT_OTHER): Payer: Medicare Other | Admitting: Hematology

## 2021-02-03 ENCOUNTER — Ambulatory Visit (INDEPENDENT_AMBULATORY_CARE_PROVIDER_SITE_OTHER): Payer: Medicare Other | Admitting: Gastroenterology

## 2021-02-03 VITALS — BP 128/52 | HR 64 | Temp 97.9°F | Resp 18

## 2021-02-03 VITALS — BP 128/62 | HR 72 | Temp 96.9°F | Resp 18 | Wt 149.0 lb

## 2021-02-03 DIAGNOSIS — C184 Malignant neoplasm of transverse colon: Secondary | ICD-10-CM

## 2021-02-03 DIAGNOSIS — E064 Drug-induced thyroiditis: Secondary | ICD-10-CM

## 2021-02-03 DIAGNOSIS — Z79899 Other long term (current) drug therapy: Secondary | ICD-10-CM | POA: Diagnosis not present

## 2021-02-03 DIAGNOSIS — C7800 Secondary malignant neoplasm of unspecified lung: Secondary | ICD-10-CM | POA: Diagnosis not present

## 2021-02-03 DIAGNOSIS — C787 Secondary malignant neoplasm of liver and intrahepatic bile duct: Secondary | ICD-10-CM | POA: Diagnosis not present

## 2021-02-03 DIAGNOSIS — C189 Malignant neoplasm of colon, unspecified: Secondary | ICD-10-CM

## 2021-02-03 DIAGNOSIS — C773 Secondary and unspecified malignant neoplasm of axilla and upper limb lymph nodes: Secondary | ICD-10-CM | POA: Diagnosis not present

## 2021-02-03 DIAGNOSIS — Z95828 Presence of other vascular implants and grafts: Secondary | ICD-10-CM

## 2021-02-03 DIAGNOSIS — Z5112 Encounter for antineoplastic immunotherapy: Secondary | ICD-10-CM | POA: Diagnosis not present

## 2021-02-03 LAB — TSH: TSH: 2.045 u[IU]/mL (ref 0.350–4.500)

## 2021-02-03 LAB — CBC WITH DIFFERENTIAL/PLATELET
Abs Immature Granulocytes: 0.03 10*3/uL (ref 0.00–0.07)
Basophils Absolute: 0.1 10*3/uL (ref 0.0–0.1)
Basophils Relative: 1 %
Eosinophils Absolute: 0.2 10*3/uL (ref 0.0–0.5)
Eosinophils Relative: 2 %
HCT: 41 % (ref 36.0–46.0)
Hemoglobin: 13 g/dL (ref 12.0–15.0)
Immature Granulocytes: 0 %
Lymphocytes Relative: 18 %
Lymphs Abs: 1.6 10*3/uL (ref 0.7–4.0)
MCH: 30.4 pg (ref 26.0–34.0)
MCHC: 31.7 g/dL (ref 30.0–36.0)
MCV: 96 fL (ref 80.0–100.0)
Monocytes Absolute: 0.8 10*3/uL (ref 0.1–1.0)
Monocytes Relative: 10 %
Neutro Abs: 6 10*3/uL (ref 1.7–7.7)
Neutrophils Relative %: 69 %
Platelets: 286 10*3/uL (ref 150–400)
RBC: 4.27 MIL/uL (ref 3.87–5.11)
RDW: 14.3 % (ref 11.5–15.5)
WBC: 8.6 10*3/uL (ref 4.0–10.5)
nRBC: 0 % (ref 0.0–0.2)

## 2021-02-03 LAB — COMPREHENSIVE METABOLIC PANEL
ALT: 16 U/L (ref 0–44)
AST: 19 U/L (ref 15–41)
Albumin: 3.7 g/dL (ref 3.5–5.0)
Alkaline Phosphatase: 76 U/L (ref 38–126)
Anion gap: 7 (ref 5–15)
BUN: 15 mg/dL (ref 8–23)
CO2: 25 mmol/L (ref 22–32)
Calcium: 9.1 mg/dL (ref 8.9–10.3)
Chloride: 102 mmol/L (ref 98–111)
Creatinine, Ser: 0.67 mg/dL (ref 0.44–1.00)
GFR, Estimated: >60 mL/min (ref >60)
Glucose, Bld: 112 mg/dL — ABNORMAL HIGH (ref 70–99)
Potassium: 4.3 mmol/L (ref 3.5–5.1)
Sodium: 134 mmol/L — ABNORMAL LOW (ref 135–145)
Total Bilirubin: 0.4 mg/dL (ref 0.3–1.2)
Total Protein: 7.6 g/dL (ref 6.5–8.1)

## 2021-02-03 LAB — MAGNESIUM: Magnesium: 2.2 mg/dL (ref 1.7–2.4)

## 2021-02-03 MED ORDER — SODIUM CHLORIDE 0.9 % IV SOLN: Freq: Once | INTRAVENOUS | Status: AC

## 2021-02-03 MED ORDER — SODIUM CHLORIDE 0.9 % IV SOLN
200.0000 mg | Freq: Once | INTRAVENOUS | Status: AC
  Administered 2021-02-03: 200 mg via INTRAVENOUS
  Filled 2021-02-03: qty 8

## 2021-02-03 MED ORDER — HEPARIN SOD (PORK) LOCK FLUSH 100 UNIT/ML IV SOLN
500.0000 [IU] | Freq: Once | INTRAVENOUS | Status: AC | PRN
  Administered 2021-02-03: 500 [IU]

## 2021-02-03 MED ORDER — SODIUM CHLORIDE 0.9% FLUSH
10.0000 mL | INTRAVENOUS | Status: DC | PRN
Start: 2021-02-03 — End: 2021-02-03
  Administered 2021-02-03: 10 mL

## 2021-02-03 NOTE — Progress Notes (Signed)
Patient has been assessed and labs reviewed by Dr. Delton Coombes. Okay to proceed with treatment today. Primary RN and Pharmacy made aware.

## 2021-02-03 NOTE — Patient Instructions (Signed)
[facility] at [facility] Discharge Instructions  You were seen today by Dr. Katragadda. He went over your recent results and scans. Dr. Katragadda will see you back in 6 weeks for labs and follow up.   Thank you for choosing [facility] at [facility] to provide your oncology and hematology care.  To afford each patient quality time with our provider, please arrive at least 15 minutes before your scheduled appointment time.   If you have a lab appointment with the Cancer Center please come in thru the Main Entrance and check in at the main information desk  You need to re-schedule your appointment should you arrive 10 or more minutes late.  We strive to give you quality time with our providers, and arriving late affects you and other patients whose appointments are after yours.  Also, if you no show three or more times for appointments you may be dismissed from the clinic at the providers discretion.     Again, thank you for choosing [facility].  Our hope is that these requests will decrease the amount of time that you wait before being seen by our physicians.       _____________________________________________________________  Should you have questions after your visit to [facility], please contact our office at [phone] between the hours of 8:00 a.m. and 4:30 p.m.  Voicemails left after 4:00 p.m. will not be returned until the following business day.  For prescription refill requests, have your pharmacy contact our office and allow 72 hours.    Cancer Center Support Programs:   > Cancer Support Group  2nd Tuesday of the month 1pm-2pm, Journey Room   

## 2021-02-03 NOTE — Progress Notes (Signed)
Nutrition Follow-up:  Patient with colon cancer metastatic to liver, lung, and lymph. She is s/p right hemicolectomy 12/29/13. Patient is receiving Keytruda.  Met with patient in infusion, she reports ongoing diarrhea. Patient is taking Imodium, says sometimes this works and sometimes it doesn't. Patient reports she has been taking it so long she is probably immune to it. Patient reports 3-6 watery bowel movements daily. She often does not eat on days she has appointments because she is afraid of having diarrhea. Patient reports her appetite is good and waits to eat when she can remain at home. Patient reports going out to eat last night with her family, says she had to use the bathroom before leaving the restaurant and again after getting home. Patient had a cheese steak sub. Patient reports diarrhea has been going on prior to treatment, says her stools are foul smelling and are light in color.    Medications: Zofran, D3, Fish oil, MVI  Labs: Na 134, Glucose 112  Anthropometrics: Weight 149 lb today increased from 147 lb 12.8 oz on 7/07  NUTRITION DIAGNOSIS: Unintentional weight loss stable   INTERVENTION:  Reviewed tips for diarrhea, patient has handout Encouraged bland foods, avoiding greasy fried foods, eating small meals more frequently Recipes for home made electrolyte solution, rice porridge given ?? EPI - Suggested trial of low-fat diet, handout with sample menu provided Boost Breeze sample given to drink during infusion Boost coupons given Patient has contact information      MONITORING, EVALUATION, GOAL: weight trends, intake   NEXT VISIT: Thursday August 18 in infusion

## 2021-02-03 NOTE — Progress Notes (Signed)
Patient presents today for Keytruda infusion per providers order.  Vital signs and labs within parameters for treatment.  Patient has no new complaints at this time.  Keytruda given today per MD orders.  Stable during infusion without adverse affects.  Vital signs stable.  No complaints at this time.  Discharge from clinic ambulatory in stable condition.  Alert and oriented X 3.  Follow up with [facility] as scheduled.  

## 2021-02-03 NOTE — Patient Instructions (Signed)
[facility]  Discharge Instructions: Thank you for choosing [facility] to provide your oncology and hematology care.  If you have a lab appointment with the Cancer Center, please come in thru the Main Entrance and check in at the main information desk.  Wear comfortable clothing and clothing appropriate for easy access to any Portacath or PICC line.   We strive to give you quality time with your provider. You may need to reschedule your appointment if you arrive late (15 or more minutes).  Arriving late affects you and other patients whose appointments are after yours.  Also, if you miss three or more appointments without notifying the office, you may be dismissed from the clinic at the provider's discretion.      For prescription refill requests, have your pharmacy contact our office and allow 72 hours for refills to be completed.    Today you received the following chemotherapy and/or immunotherapy agents Keytruda       To help prevent nausea and vomiting after your treatment, we encourage you to take your nausea medication as directed.  BELOW ARE SYMPTOMS THAT SHOULD BE REPORTED IMMEDIATELY: *FEVER GREATER THAN 100.4 F (38 C) OR HIGHER *CHILLS OR SWEATING *NAUSEA AND VOMITING THAT IS NOT CONTROLLED WITH YOUR NAUSEA MEDICATION *UNUSUAL SHORTNESS OF BREATH *UNUSUAL BRUISING OR BLEEDING *URINARY PROBLEMS (pain or burning when urinating, or frequent urination) *BOWEL PROBLEMS (unusual diarrhea, constipation, pain near the anus) TENDERNESS IN MOUTH AND THROAT WITH OR WITHOUT PRESENCE OF ULCERS (sore throat, sores in mouth, or a toothache) UNUSUAL RASH, SWELLING OR PAIN  UNUSUAL VAGINAL DISCHARGE OR ITCHING   Items with * indicate a potential emergency and should be followed up as soon as possible or go to the Emergency Department if any problems should occur.  Please show the CHEMOTHERAPY ALERT CARD or IMMUNOTHERAPY ALERT CARD at check-in to the Emergency  Department and triage nurse.  Should you have questions after your visit or need to cancel or reschedule your appointment, please contact  [facility]ANC[facility]ENTER [phone]  and follow the prompts.  Office hours are 8:00 a.m. to 4:30 p.m. Monday - Friday. Please note that voicemails left after 4:00 p.m. may not be returned until the following business day.  We are closed weekends and major holidays. You have access to a nurse at all times for urgent questions. Please call the main number to the clinic [phone] and follow the prompts.  For any non-urgent questions, you may also contact your provider using MyChart. We now offer e-Visits for anyone 18 and older to request care online for non-urgent symptoms. For details visit [email or web address].   Also download the MyChart app! Go to the app store, search "MyChart", open the app, select [facility], and log in with your MyChart username and password.  Due to Covid, a mask is required upon entering the hospital/clinic. If you do not have a mask, one will be given to you upon arrival. For doctor visits, patients may have 1 support person aged 18 or older with them. For treatment visits, patients cannot have anyone with them due to current Covid guidelines and our immunocompromised population.  

## 2021-02-04 LAB — CEA: CEA: 53.7 ng/mL — ABNORMAL HIGH (ref 0.0–4.7)

## 2021-02-24 ENCOUNTER — Inpatient Hospital Stay (HOSPITAL_COMMUNITY): Payer: Medicare Other | Admitting: Dietician

## 2021-02-24 ENCOUNTER — Other Ambulatory Visit: Payer: Self-pay

## 2021-02-24 ENCOUNTER — Inpatient Hospital Stay (HOSPITAL_COMMUNITY): Payer: Medicare Other

## 2021-02-24 ENCOUNTER — Inpatient Hospital Stay (HOSPITAL_COMMUNITY): Payer: Medicare Other | Attending: Hematology

## 2021-02-24 ENCOUNTER — Other Ambulatory Visit (HOSPITAL_COMMUNITY): Payer: Self-pay | Admitting: *Deleted

## 2021-02-24 VITALS — BP 124/51 | HR 63 | Temp 97.9°F | Resp 18

## 2021-02-24 DIAGNOSIS — C779 Secondary and unspecified malignant neoplasm of lymph node, unspecified: Secondary | ICD-10-CM | POA: Diagnosis not present

## 2021-02-24 DIAGNOSIS — C184 Malignant neoplasm of transverse colon: Secondary | ICD-10-CM | POA: Diagnosis not present

## 2021-02-24 DIAGNOSIS — C787 Secondary malignant neoplasm of liver and intrahepatic bile duct: Secondary | ICD-10-CM | POA: Diagnosis not present

## 2021-02-24 DIAGNOSIS — Z5112 Encounter for antineoplastic immunotherapy: Secondary | ICD-10-CM | POA: Insufficient documentation

## 2021-02-24 DIAGNOSIS — Z95828 Presence of other vascular implants and grafts: Secondary | ICD-10-CM

## 2021-02-24 DIAGNOSIS — Z79899 Other long term (current) drug therapy: Secondary | ICD-10-CM | POA: Diagnosis not present

## 2021-02-24 DIAGNOSIS — C7800 Secondary malignant neoplasm of unspecified lung: Secondary | ICD-10-CM | POA: Diagnosis not present

## 2021-02-24 DIAGNOSIS — E064 Drug-induced thyroiditis: Secondary | ICD-10-CM

## 2021-02-24 DIAGNOSIS — C189 Malignant neoplasm of colon, unspecified: Secondary | ICD-10-CM

## 2021-02-24 LAB — COMPREHENSIVE METABOLIC PANEL
ALT: 19 U/L (ref 0–44)
AST: 25 U/L (ref 15–41)
Albumin: 3.8 g/dL (ref 3.5–5.0)
Alkaline Phosphatase: 70 U/L (ref 38–126)
Anion gap: 7 (ref 5–15)
BUN: 16 mg/dL (ref 8–23)
CO2: 27 mmol/L (ref 22–32)
Calcium: 9.7 mg/dL (ref 8.9–10.3)
Chloride: 100 mmol/L (ref 98–111)
Creatinine, Ser: 0.58 mg/dL (ref 0.44–1.00)
GFR, Estimated: >60 mL/min (ref >60)
Glucose, Bld: 100 mg/dL — ABNORMAL HIGH (ref 70–99)
Potassium: 4.4 mmol/L (ref 3.5–5.1)
Sodium: 134 mmol/L — ABNORMAL LOW (ref 135–145)
Total Bilirubin: 0.4 mg/dL (ref 0.3–1.2)
Total Protein: 7.7 g/dL (ref 6.5–8.1)

## 2021-02-24 LAB — TSH: TSH: 2.602 u[IU]/mL (ref 0.350–4.500)

## 2021-02-24 LAB — CBC WITH DIFFERENTIAL/PLATELET
Abs Immature Granulocytes: 0.06 10*3/uL (ref 0.00–0.07)
Basophils Absolute: 0.1 10*3/uL (ref 0.0–0.1)
Basophils Relative: 1 %
Eosinophils Absolute: 0.2 10*3/uL (ref 0.0–0.5)
Eosinophils Relative: 2 %
HCT: 41 % (ref 36.0–46.0)
Hemoglobin: 12.9 g/dL (ref 12.0–15.0)
Immature Granulocytes: 1 %
Lymphocytes Relative: 18 %
Lymphs Abs: 1.9 10*3/uL (ref 0.7–4.0)
MCH: 29.6 pg (ref 26.0–34.0)
MCHC: 31.5 g/dL (ref 30.0–36.0)
MCV: 94 fL (ref 80.0–100.0)
Monocytes Absolute: 1 10*3/uL (ref 0.1–1.0)
Monocytes Relative: 10 %
Neutro Abs: 7.2 10*3/uL (ref 1.7–7.7)
Neutrophils Relative %: 68 %
Platelets: 215 10*3/uL (ref 150–400)
RBC: 4.36 MIL/uL (ref 3.87–5.11)
RDW: 13.9 % (ref 11.5–15.5)
WBC: 10.3 10*3/uL (ref 4.0–10.5)
nRBC: 0 % (ref 0.0–0.2)

## 2021-02-24 LAB — MAGNESIUM: Magnesium: 2.2 mg/dL (ref 1.7–2.4)

## 2021-02-24 MED ORDER — HEPARIN SOD (PORK) LOCK FLUSH 100 UNIT/ML IV SOLN
500.0000 [IU] | Freq: Once | INTRAVENOUS | Status: AC | PRN
  Administered 2021-02-24: 500 [IU]

## 2021-02-24 MED ORDER — SODIUM CHLORIDE 0.9 % IV SOLN
200.0000 mg | Freq: Once | INTRAVENOUS | Status: AC
  Administered 2021-02-24: 200 mg via INTRAVENOUS
  Filled 2021-02-24: qty 8

## 2021-02-24 MED ORDER — SODIUM CHLORIDE 0.9% FLUSH
10.0000 mL | INTRAVENOUS | Status: DC | PRN
  Administered 2021-02-24: 10 mL

## 2021-02-24 MED ORDER — HYDROCORT-PRAMOXINE (PERIANAL) 1-1 % EX FOAM: 1.0000 | Freq: Two times a day (BID) | CUTANEOUS | 0 refills | Status: DC

## 2021-02-24 MED ORDER — SODIUM CHLORIDE 0.9 % IV SOLN: Freq: Once | INTRAVENOUS | Status: AC

## 2021-02-24 NOTE — Patient Instructions (Signed)
[facility]  Discharge Instructions: Thank you for choosing [facility] to provide your oncology and hematology care.  If you have a lab appointment with the Cancer Center, please come in thru the Main Entrance and check in at the main information desk.  Wear comfortable clothing and clothing appropriate for easy access to any Portacath or PICC line.   We strive to give you quality time with your provider. You may need to reschedule your appointment if you arrive late (15 or more minutes).  Arriving late affects you and other patients whose appointments are after yours.  Also, if you miss three or more appointments without notifying the office, you may be dismissed from the clinic at the provider's discretion.      For prescription refill requests, have your pharmacy contact our office and allow 72 hours for refills to be completed.    Today you received the following chemotherapy and/or immunotherapy agents Keytruda       To help prevent nausea and vomiting after your treatment, we encourage you to take your nausea medication as directed.  BELOW ARE SYMPTOMS THAT SHOULD BE REPORTED IMMEDIATELY: *FEVER GREATER THAN 100.4 F (38 C) OR HIGHER *CHILLS OR SWEATING *NAUSEA AND VOMITING THAT IS NOT CONTROLLED WITH YOUR NAUSEA MEDICATION *UNUSUAL SHORTNESS OF BREATH *UNUSUAL BRUISING OR BLEEDING *URINARY PROBLEMS (pain or burning when urinating, or frequent urination) *BOWEL PROBLEMS (unusual diarrhea, constipation, pain near the anus) TENDERNESS IN MOUTH AND THROAT WITH OR WITHOUT PRESENCE OF ULCERS (sore throat, sores in mouth, or a toothache) UNUSUAL RASH, SWELLING OR PAIN  UNUSUAL VAGINAL DISCHARGE OR ITCHING   Items with * indicate a potential emergency and should be followed up as soon as possible or go to the Emergency Department if any problems should occur.  Please show the CHEMOTHERAPY ALERT CARD or IMMUNOTHERAPY ALERT CARD at check-in to the Emergency  Department and triage nurse.  Should you have questions after your visit or need to cancel or reschedule your appointment, please contact [facility] [phone]  and follow the prompts.  Office hours are 8:00 a.m. to 4:30 p.m. Monday - Friday. Please note that voicemails left after 4:00 p.m. may not be returned until the following business day.  We are closed weekends and major holidays. You have access to a nurse at all times for urgent questions. Please call the main number to the clinic [phone] and follow the prompts.  For any non-urgent questions, you may also contact your provider using MyChart. We now offer e-Visits for anyone 18 and older to request care online for non-urgent symptoms. For details visit [email or web address].   Also download the MyChart app! Go to the app store, search "MyChart", open the app, select [facility], and log in with your MyChart username and password.  Due to Covid, a mask is required upon entering the hospital/clinic. If you do not have a mask, one will be given to you upon arrival. For doctor visits, patients may have 1 support person aged 18 or older with them. For treatment visits, patients cannot have anyone with them due to current Covid guidelines and our immunocompromised population.  

## 2021-02-24 NOTE — Progress Notes (Signed)
Nutrition Follow-up:  Patient with colon cancer metastatic to liver, lung and lymph. She is receiving Keytruda.   Met with patient in infusion. She reports diarrhea has not improved. Patient having continues to have diarrhea after eating. She states it feels like fire water it burns so bad. She is taking imodium which works sometimes. Patient reports MD has called in suppository for her today. She "has so many hemorrhoids she doesn't know if she will be able to find where to put it" Patient has tried banana's and rice, this has not helped to firm up stool. Patient is eating smaller meals and adding snacks. She is trying to watch what she eats, avoids spicy foods but sometimes can not help to have favorite foods that she knows will result in diarrhea. She is drinking 1 Boost daily, normally forgets about them until later in the day or would drink more. She is drinking lots of water.    Medications: Proctofoam-HC  Labs: Na 134, Glucose 100  Anthropometrics: No new weight today for review   NUTRITION DIAGNOSIS: Unintentional weight loss likely ongoing   INTERVENTION:  Encouraged eating small meals and snacks, reviewed foods to avoid Discussed trying to eat around same times, encouraged laying down after eating as able  Encouraged pt to try electrolyte recipe provided, discussed importance of hydration Continue drinking Boost Plus, recommended 2-3 daily as tolerated, patient politely declined need for coupons today Patient has contact information   MONITORING, EVALUATION, GOAL: weight trends, intake   NEXT VISIT: Thursday September 9 in clinic

## 2021-02-24 NOTE — Progress Notes (Signed)
Patient presents today for Keytruda. Labs within parameters for treatment. Vital signs within parameters for treatment.   Pt c/o hemorrhoid pain Dr.Katragadda made aware. Message received from Dr.K he prescribed Proctofoam suppositories rx sent to [facility] in [place] . Pt made aware and verbalized understanding.  Keytruda given today per MD orders. Tolerated infusion without adverse affects. Vital signs stable. No complaints at this time. Discharged from clinic ambulatory in stable condition. Alert and oriented x 3. F/U with [facility] as scheduled.

## 2021-02-25 ENCOUNTER — Other Ambulatory Visit (HOSPITAL_COMMUNITY): Payer: Self-pay

## 2021-02-25 LAB — CEA: CEA: 31.7 ng/mL — ABNORMAL HIGH (ref 0.0–4.7)

## 2021-02-25 MED ORDER — HYDROCORTISONE ACE-PRAMOXINE 1-1 % EX CREA: 1.0000 "application " | TOPICAL_CREAM | Freq: Two times a day (BID) | CUTANEOUS | 0 refills | Status: DC

## 2021-03-01 DIAGNOSIS — C189 Malignant neoplasm of colon, unspecified: Secondary | ICD-10-CM | POA: Diagnosis not present

## 2021-03-01 DIAGNOSIS — Z0001 Encounter for general adult medical examination with abnormal findings: Secondary | ICD-10-CM | POA: Diagnosis not present

## 2021-03-01 DIAGNOSIS — Z6825 Body mass index (BMI) 25.0-25.9, adult: Secondary | ICD-10-CM | POA: Diagnosis not present

## 2021-03-01 DIAGNOSIS — Z1331 Encounter for screening for depression: Secondary | ICD-10-CM | POA: Diagnosis not present

## 2021-03-01 DIAGNOSIS — C787 Secondary malignant neoplasm of liver and intrahepatic bile duct: Secondary | ICD-10-CM | POA: Diagnosis not present

## 2021-03-01 DIAGNOSIS — E663 Overweight: Secondary | ICD-10-CM | POA: Diagnosis not present

## 2021-03-15 DIAGNOSIS — H2512 Age-related nuclear cataract, left eye: Secondary | ICD-10-CM | POA: Diagnosis not present

## 2021-03-15 DIAGNOSIS — H2511 Age-related nuclear cataract, right eye: Secondary | ICD-10-CM | POA: Diagnosis not present

## 2021-03-16 NOTE — Progress Notes (Signed)
[place], [place] [address]   CLINIC:  Medical Oncology/Hematology  PCP:  [facility], [facility] / [place] [address] [phone]   REASON FOR VISIT:  Follow-up for transverse colon adenocarcinoma  PRIOR THERAPY: 1. Right hemicolectomy on 12/29/2013. 2. FOLFOX x 3 cycles from 02/10/2014 to 03/10/2014, stopped secondary to poor tolerance  NGS Results: MSI high, MMR deficient, BRAF V600E, TMB high  CURRENT THERAPY: Keytruda every 3 weeks  BRIEF ONCOLOGIC HISTORY:  Oncology History  Adenocarcinoma of transverse colon ([facility]) (Resolved)  12/09/2013 Initial Diagnosis   1. Colon, biopsy, proximal transverse mass INVASIVE ADENOCARCINOMA.   12/29/2013 Definitive Surgery   Colon, segmental resection for tumor, right - INVASIVE COLORECTAL ADENOCARCINOMA, 7 CM, EXTENDING INTO PERICOLONIC CONNECTIVE TISSUE. - METASTATIC CARCINOMA IN 6 OF 33 LYMPH NODES (6/33).   02/10/2014 - 03/10/2014 Adjuvant Chemotherapy   FOLFOX x 3 cycles   03/11/2014 Adverse Reaction   Patient called reporting diarrhea despite dose reduction and she wants to stop therapy.  Patient seen on 03/24/14 to discuss other treatment options and she stands by her decision to stop all therapy.   04/01/2014 Surgery   Port-a-cath removal by Dr. Arnoldo Morale.   01/12/2015 PET scan   Mild abdominal mesenteric LAD show hypermetabolic activity, 7 mm indeterminate pulm nodule in sup segment of RLL shows no assoc metabolic activity   6/83/4196 PET scan   Mild progression of nodal mets in anterior mid abdominal mesentery, new nodal mets at L thoracic inlet. stable 6 mm nodule in posterior RLL, unchanged since 2015   06/22/2015 PET scan   Resolution of metabolic activity and decreased size of mild LAD at L thoracic inlet, stable mild mid abd hypermetabolic mesenteric LAD, stable 6 mm posterior LLL pulm nodule without metabolic activity   08/31/9796 Imaging   MRI lumbar spine L4-L5 disc  degenerated, broad based disc hernation, B facet arthropathy with hypertophy and edema, stenosis of lateral recesses that could cause neural compression   10/18/2016 Imaging   CT CAP- 1. Several small ground-glass pulmonary nodules are present in the lungs and are stable over the past 9 months, but several are mildly larger than they were in 2015. Low-grade adenocarcinoma can sometimes have this appearance and surveillance is likely warranted. 2. There is a cluster of nodal tissue in the central mesentery, maximum short axis diameter 1.3 cm. This nodal cluster is relatively low in density and not appreciably changed from the prior exam. Given the lack of change is may represent quiescent residua of malignancy, but again, surveillance is likely warranted. 3. The left-sided rib fractures are more numerous than was revealed on the prior radiographs ; there are nondisplaced healing fractures of the left fifth, sixth, seventh, eighth, ninth, and tenth ribs. 4. Mild cardiomegaly although part of this appearance may be due to pectus excavatum. 5. Several additional small pulmonary nodules are stable from 2015 and considered benign. 6. Right hemicolectomy and sigmoid colon diverticulosis. 7. Small right paraumbilical hernia containing adipose tissue. 8. Lower lumbar spondylosis and degenerative disc disease potentially with mild impingement at L4-5.   04/25/2017 Imaging   CT C/A/P: Scattered solid pulmonary nodules measure up to 6 mm in the right lower lobe, unchanged. There are a few scattered ground-glass nodules measuring up to 8 mm in the right upper lobe, also stable. Distal perigastric lymph nodes are stable. No additional evidence of metastatic disease.   Metastatic colon cancer to liver ([facility])  11/04/2020 Initial Diagnosis  Metastatic colon cancer to liver ([facility])   11/10/2020 - 12/10/2020 Chemotherapy          01/13/2021 -  Chemotherapy    Patient is on Treatment Plan: COLORECTAL  PEMBROLIZUMAB Q21D         CANCER STAGING: Cancer Staging No matching staging information was found for the patient.  INTERVAL HISTORY:  Amanda Norris, a 75 y.o. female, returns for routine follow-up and consideration for next cycle of chemotherapy. Margery was last seen on 02/03/2021.  Due for cycle #4 of Keytruda today.   Overall, she tells me she has been feeling pretty well. She reports severe burning on the soles of her feet over the past few week while sitting down at night. She reports stable but still present watery diarrhea as well as fatigue. The diarrhea is present after eating and is helped by Imodium. She denies SOB, cough, itching, and rash.   Overall, she feels ready for next cycle of chemo today.   REVIEW OF SYSTEMS:  Review of Systems  Constitutional:  Positive for fatigue (25%). Negative for appetite change (75%).  Respiratory:  Negative for cough and shortness of breath.   Gastrointestinal:  Positive for diarrhea.  Skin:  Negative for itching and rash.  Neurological:  Positive for numbness (burning bialteral feet while sitting at night).  All other systems reviewed and are negative.  PAST MEDICAL/SURGICAL HISTORY:  Past Medical History:  Diagnosis Date   Arthritis    wrist and thumbs   Colon cancer (HCC)    PONV (postoperative nausea and vomiting)    Port-A-Cath in place 11/09/2020   Ruptured lumbar disc    L1-L5 per patient report   Sciatica of left side    Past Surgical History:  Procedure Laterality Date   COLONOSCOPY N/A 12/12/2013   Procedure: COLONOSCOPY;  Surgeon: Rogene Houston, MD;  [facility];  Service: Endoscopy;  Laterality: N/A;  730   COLONOSCOPY N/A 01/20/2015   Procedure: COLONOSCOPY;  Surgeon: Rogene Houston, MD;  [facility];  Service: Endoscopy;  Laterality: N/A;  1030   COLONOSCOPY N/A 03/28/2018   Procedure: COLONOSCOPY;  Surgeon: Rogene Houston, MD;  [facility];  Service: Endoscopy;   Laterality: N/A;  1015   EUS N/A 12/18/2013   Procedure: UPPER ENDOSCOPIC ULTRASOUND (EUS) LINEAR;  Surgeon: Milus Banister, MD;  Location: [facility];  Service: Endoscopy;  Laterality: N/A;   herniated disc     1996-[id number]   IR IMAGING GUIDED PORT INSERTION  10/14/2020   IR US GUIDANCE  10/14/2020   PARTIAL COLECTOMY N/A 12/29/2013   Procedure: RIGHT HEMICOLECTOMY;  Surgeon: Jamesetta So, MD;  Location: [facility];  Service: General;  Laterality: N/A;   POLYPECTOMY  03/28/2018   Procedure: POLYPECTOMY;  Surgeon: Rogene Houston, MD;  [facility];  Service: Endoscopy;;  transverse colon (hot snare);   PORT-A-CATH REMOVAL Right 04/01/2014   Procedure: MINOR REMOVAL PORT-A-CATH;  Surgeon: Jamesetta So, MD;  Location: [facility];  Service: General;  Laterality: Right;   PORTACATH PLACEMENT Right 02/04/2014   Procedure: INSERTION PORT-A-CATH;  Surgeon: Jamesetta So, MD;  Location: [facility];  Service: General;  Laterality: Right;    SOCIAL HISTORY:  Social History   Socioeconomic History   Marital status: Married    Spouse name: Not on file   Number of children: Not on file   Years of education: Not on file   Highest education level: Not  on file  Occupational History   Not on file  Tobacco Use   Smoking status: Former    Packs/day: 0.25    Years: 5.00    Pack years: 1.25    Types: Cigarettes    Quit date: 04/19/1959    Years since quitting: 61.9   Smokeless tobacco: Never   Tobacco comments:    smoked for 5 years as teenager  Vaping Use   Vaping Use: Never used  Substance and Sexual Activity   Alcohol use: No   Drug use: No   Sexual activity: Yes    Birth control/protection: Post-menopausal  Other Topics Concern   Not on file  Social History Narrative   Not on file   Social Determinants of Health   Financial Resource Strain: Low Risk    Difficulty of Paying Living Expenses: Not hard at all  Food Insecurity: No Food Insecurity   Worried About Charity fundraiser in  the Last Year: Never true   Ran Out of Food in the Last Year: Never true  Transportation Needs: No Transportation Needs   Lack of Transportation (Medical): No   Lack of Transportation (Non-Medical): No  Physical Activity: Inactive   Days of Exercise per Week: 0 days   Minutes of Exercise per Session: 0 min  Stress: Stress Concern Present   Feeling of Stress : To some extent  Social Connections: Engineer, building services of Communication with Friends and Family: More than three times a week   Frequency of Social Gatherings with Friends and Family: More than three times a week   Attends Religious Services: More than 4 times per year   Active Member of [employer] or Organizations: Yes   Attends Music therapist: More than 4 times per year   Marital Status: Married  Human resources officer Violence: Not At Risk   Fear of Current or Ex-Partner: No   Emotionally Abused: No   Physically Abused: No   Sexually Abused: No    FAMILY HISTORY:  Family History  Problem Relation Age of Onset   Diabetes type II Mother    Dementia Father     CURRENT MEDICATIONS:  Current Outpatient Medications  Medication Sig Dispense Refill   acetaminophen (TYLENOL) 500 MG tablet Take 500 mg by mouth every 6 (six) hours as needed for moderate pain or headache.     aspirin 81 MG EC tablet Take 1 tablet (81 mg total) by mouth daily. Swallow whole. 30 tablet 12   Bioflavonoid Products (ESTER C PO) Take 1,000 mg by mouth daily.     Cholecalciferol (D3-1000) 25 MCG (1000 UT) capsule Take 125 Units by mouth daily.     Cranberry-Vitamin C-Vitamin E 4200-20-3 MG-MG-UNIT CAPS Take 500 mg by mouth daily.     Garlic 2330 MG CAPS Take 1,000 mg by mouth daily.      hydrocortisone-pramoxine (PROCTOFOAM-HC) rectal foam Place 1 applicator rectally 2 (two) times daily. 30 g 0   Multiple Vitamin (MULTIVITAMIN) tablet Take 1 tablet by mouth daily.     Omega-3 Fatty Acids (FISH OIL) 1200 MG CAPS Take 1,000 mg by mouth  daily.     ondansetron (ZOFRAN ODT) 8 MG disintegrating tablet Take 1 tablet (8 mg total) by mouth every 8 (eight) hours as needed for nausea or vomiting. 30 tablet 3   OVER THE COUNTER MEDICATION Take 1,000 mg by mouth daily. [place] 1000 mg daily     OVER THE COUNTER MEDICATION Take 1,000 mg by mouth daily.  L-Lysine 1037m po daily     pramoxine-hydrocortisone (PROCTOCREAM-HC) 1-1 % rectal cream Place 1 application rectally 2 (two) times daily. 30 g 0   zinc gluconate 50 MG tablet Take 50 mg by mouth daily.     No current facility-administered medications for this visit.    ALLERGIES:  Allergies  Allergen Reactions   Sulfa Antibiotics Other (See Comments)    "Sores in my mouth"    PHYSICAL EXAM:  Performance status (ECOG): 1 - Symptomatic but completely ambulatory  There were no vitals filed for this visit. Wt Readings from Last 3 Encounters:  02/03/21 149 lb (67.6 kg)  01/13/21 147 lb 12.8 oz (67 kg)  12/22/20 149 lb 3.2 oz (67.7 kg)   Physical Exam Vitals reviewed.  Constitutional:      Appearance: Normal appearance.  Cardiovascular:     Rate and Rhythm: Normal rate and regular rhythm.     Pulses: Normal pulses.     Heart sounds: Normal heart sounds.  Pulmonary:     Effort: Pulmonary effort is normal.     Breath sounds: Normal breath sounds.  Musculoskeletal:     Right lower leg: No edema.     Left lower leg: No edema.  Neurological:     General: No focal deficit present.     Mental Status: She is alert and oriented to person, place, and time.  Psychiatric:        Mood and Affect: Mood normal.        Behavior: Behavior normal.    LABORATORY DATA:  I have reviewed the labs as listed.  CBC Latest Ref Rng & Units 02/24/2021 02/03/2021 01/13/2021  WBC 4.0 - 10.5 K/uL 10.3 8.6 8.9  Hemoglobin 12.0 - 15.0 g/dL 12.9 13.0 13.9  Hematocrit 36.0 - 46.0 % 41.0 41.0 43.8  Platelets 150 - 400 K/uL 215 286 213   CMP Latest Ref Rng & Units 02/24/2021 02/03/2021 01/13/2021   Glucose 70 - 99 mg/dL 100(H) 112(H) 106(H)  BUN 8 - 23 mg/dL 16 15 13   Creatinine 0.44 - 1.00 mg/dL 0.58 0.67 0.66  Sodium 135 - 145 mmol/L 134(L) 134(L) 138  Potassium 3.5 - 5.1 mmol/L 4.4 4.3 4.5  Chloride 98 - 111 mmol/L 100 102 103  CO2 22 - 32 mmol/L 27 25 26   Calcium 8.9 - 10.3 mg/dL 9.7 9.1 9.5  Total Protein 6.5 - 8.1 g/dL 7.7 7.6 8.0  Total Bilirubin 0.3 - 1.2 mg/dL 0.4 0.4 0.6  Alkaline Phos 38 - 126 U/L 70 76 77  AST 15 - 41 U/L 25 19 24   ALT 0 - 44 U/L 19 16 17     DIAGNOSTIC IMAGING:  I have independently reviewed the scans and discussed with the patient. No results found.   ASSESSMENT:  1.  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 - Status post 3 cycles of FOLFOX from 02/10/2014 through 03/10/2014, discontinued secondary to poor tolerance.  DPD was negative. -Last colonoscopy on 03/28/2018 patent ileo-colonic anastomosis.  8 mm polyp in the transverse colon.  Diverticulosis in the sigmoid colon, descending colon. - CT scan on 11/04/2018 shows postoperative findings of right colectomy with redemonstrated mesenteric nodule/lymph node measuring 1.9 x 1.7 cm, unchanged from prior scan in October 2019.  No evidence of metastatic disease.  Stable solid and groundglass pulmonary nodules, stable over multiple prior exams. -CEA was 4.6 on 10/28/2018. -CTAP on  09/21/2020 showed numerous small pulmonary nodules throughout the lung bases, 5 mm.  Enlarged retrocrural periaortic lymph node in the lower chest.  Soft tissue nodule within the central mesentery substantially increased in size, encasing proximal superior mesenteric artery and measuring 5 x 4.3 cm.  Numerous retroperitoneal lymph nodes, largest aortocaval lymph node measuring 2.8 x 2.2 cm. -CT chest on 09/27/2020 shows a left thoracic inlet lymph node approximately 2.3 cm.  Mediastinal lymph  nodes and multiple subcentimeter pulmonary nodules suggestive of metastatic disease. -Mesenteric lymph node biopsy on 09/28/2020 shows mucin and fibrosis. -Left supraclavicular lymph node biopsy on 10/14/2020-soft tissue with abundant mucin, rare fragments of atypical columnar epithelium. - 3 cycles of FOLFIRI and bevacizumab dose reduced from 11/10/2020 through 12/08/2020. - [facility] test-BRAF V600 E+, MMR deficient, MSI high, TMB high, HER2 negative, BRCA1/2 pathogenic variant on exon 14 and exon 9 respectively.  The report also suggested decreased benefit to FOLFOX and bevacizumab in the first-line metastatic setting. - Cycle 1 of Keytruda on 01/13/2021.   2.  Health maintenance: -Mammogram dated 05/16/2018 was BI-RADS Category 1.     PLAN:  1.  Metastatic colon cancer to the liver, lungs and lymph nodes: - CT CAP with contrast on 01/31/2021 showed increased size of mediastinal and hilar adenopathy with numerous lung nodules.  Slight enlargement of retroperitoneal, aortocaval and celiac axis lymph nodes and soft tissue mass about the central small bowel mesentery.  Liver lesions are stable. - She has tolerated 3 cycles of pembrolizumab reasonably well.  Reviewed labs today which showed normal LFTs and CBC.  TSH was 1.3.  CEA improved to 31.7. - She felt tired for couple of days after last treatment.  No other immunotherapy related side effects. - We will proceed with treatment today.  RTC 3 weeks for follow-up.  I plan to repeat CT CAP prior to next visit to evaluate response.   2.  Diarrhea: - She has baseline diarrhea about 3-4 watery bowel movements per day for the last 1 year. - Continue Imodium as needed.  No worsening reported.   3.  Peripheral neuropathy: - She reported burning sensation in the feet only at nighttime when she goes to the bathroom and sits on the commode.  When she comes back to the bed, burning sensation goes away.  She thinks it could be because of exposure to cold.  She  received oxaliplatin for 3 cycles in 2015.  After that she did not receive any chemotherapy that would cause peripheral neuropathy.  We will closely monitor.   4.  Nutrition: - Weight is stable.  Continue 1 can of boost per day.   Orders placed this encounter:  No orders of the defined types were placed in this encounter.    Derek Jack, MD [facility] [phone]   I, Thana Ates, am acting as a scribe for Dr. Derek Jack.  I, Derek Jack MD, have reviewed the above documentation for accuracy and completeness, and I agree with the above.

## 2021-03-17 ENCOUNTER — Inpatient Hospital Stay (HOSPITAL_COMMUNITY): Payer: Medicare Other | Admitting: Dietician

## 2021-03-17 ENCOUNTER — Inpatient Hospital Stay (HOSPITAL_BASED_OUTPATIENT_CLINIC_OR_DEPARTMENT_OTHER): Payer: Medicare Other | Admitting: Hematology

## 2021-03-17 ENCOUNTER — Inpatient Hospital Stay (HOSPITAL_COMMUNITY): Payer: Medicare Other | Attending: Hematology

## 2021-03-17 ENCOUNTER — Other Ambulatory Visit: Payer: Self-pay

## 2021-03-17 ENCOUNTER — Encounter (HOSPITAL_COMMUNITY): Payer: Self-pay | Admitting: Hematology

## 2021-03-17 ENCOUNTER — Inpatient Hospital Stay (HOSPITAL_COMMUNITY): Payer: Medicare Other

## 2021-03-17 VITALS — BP 138/52 | HR 63 | Temp 96.9°F | Resp 18 | Wt 149.6 lb

## 2021-03-17 DIAGNOSIS — C787 Secondary malignant neoplasm of liver and intrahepatic bile duct: Secondary | ICD-10-CM | POA: Insufficient documentation

## 2021-03-17 DIAGNOSIS — Z79899 Other long term (current) drug therapy: Secondary | ICD-10-CM | POA: Diagnosis not present

## 2021-03-17 DIAGNOSIS — C189 Malignant neoplasm of colon, unspecified: Secondary | ICD-10-CM

## 2021-03-17 DIAGNOSIS — Z5112 Encounter for antineoplastic immunotherapy: Secondary | ICD-10-CM | POA: Diagnosis not present

## 2021-03-17 DIAGNOSIS — Z95828 Presence of other vascular implants and grafts: Secondary | ICD-10-CM

## 2021-03-17 DIAGNOSIS — C184 Malignant neoplasm of transverse colon: Secondary | ICD-10-CM

## 2021-03-17 DIAGNOSIS — E064 Drug-induced thyroiditis: Secondary | ICD-10-CM

## 2021-03-17 LAB — CBC WITH DIFFERENTIAL/PLATELET
Abs Immature Granulocytes: 0.02 10*3/uL (ref 0.00–0.07)
Basophils Absolute: 0 10*3/uL (ref 0.0–0.1)
Basophils Relative: 1 %
Eosinophils Absolute: 0.1 10*3/uL (ref 0.0–0.5)
Eosinophils Relative: 1 %
HCT: 42.8 % (ref 36.0–46.0)
Hemoglobin: 13.2 g/dL (ref 12.0–15.0)
Immature Granulocytes: 0 %
Lymphocytes Relative: 27 %
Lymphs Abs: 1.8 10*3/uL (ref 0.7–4.0)
MCH: 29.5 pg (ref 26.0–34.0)
MCHC: 30.8 g/dL (ref 30.0–36.0)
MCV: 95.7 fL (ref 80.0–100.0)
Monocytes Absolute: 0.6 10*3/uL (ref 0.1–1.0)
Monocytes Relative: 9 %
Neutro Abs: 4.3 10*3/uL (ref 1.7–7.7)
Neutrophils Relative %: 62 %
Platelets: 215 10*3/uL (ref 150–400)
RBC: 4.47 MIL/uL (ref 3.87–5.11)
RDW: 13.3 % (ref 11.5–15.5)
WBC: 6.8 10*3/uL (ref 4.0–10.5)
nRBC: 0 % (ref 0.0–0.2)

## 2021-03-17 LAB — COMPREHENSIVE METABOLIC PANEL
ALT: 14 U/L (ref 0–44)
AST: 21 U/L (ref 15–41)
Albumin: 4.2 g/dL (ref 3.5–5.0)
Alkaline Phosphatase: 73 U/L (ref 38–126)
Anion gap: 9 (ref 5–15)
BUN: 11 mg/dL (ref 8–23)
CO2: 25 mmol/L (ref 22–32)
Calcium: 9.3 mg/dL (ref 8.9–10.3)
Chloride: 103 mmol/L (ref 98–111)
Creatinine, Ser: 0.62 mg/dL (ref 0.44–1.00)
GFR, Estimated: >60 mL/min (ref >60)
Glucose, Bld: 93 mg/dL (ref 70–99)
Potassium: 4.1 mmol/L (ref 3.5–5.1)
Sodium: 137 mmol/L (ref 135–145)
Total Bilirubin: 0.5 mg/dL (ref 0.3–1.2)
Total Protein: 7.7 g/dL (ref 6.5–8.1)

## 2021-03-17 LAB — TSH: TSH: 1.372 u[IU]/mL (ref 0.350–4.500)

## 2021-03-17 LAB — MAGNESIUM: Magnesium: 2.3 mg/dL (ref 1.7–2.4)

## 2021-03-17 MED ORDER — SODIUM CHLORIDE 0.9 % IV SOLN: Freq: Once | INTRAVENOUS | Status: AC

## 2021-03-17 MED ORDER — SODIUM CHLORIDE 0.9 % IV SOLN
200.0000 mg | Freq: Once | INTRAVENOUS | Status: AC
  Administered 2021-03-17: 200 mg via INTRAVENOUS
  Filled 2021-03-17: qty 8

## 2021-03-17 MED ORDER — HEPARIN SOD (PORK) LOCK FLUSH 100 UNIT/ML IV SOLN
500.0000 [IU] | Freq: Once | INTRAVENOUS | Status: AC | PRN
  Administered 2021-03-17: 500 [IU]

## 2021-03-17 MED ORDER — SODIUM CHLORIDE 0.9% FLUSH
10.0000 mL | INTRAVENOUS | Status: DC | PRN
  Administered 2021-03-17: 10 mL

## 2021-03-17 NOTE — Progress Notes (Signed)
Nutrition Follow-up:  Patient receiving Keytruda for metastatic colon cancer.   Unable to complete nutrition follow-up today. Patient completed treatment early and left infusion prior to visit.    Medications: Proctofoam -HC, proctocream  Labs: reviewed  Anthropometrics: Weight 149 lb 9.6 oz today stable  7/28 - 149 lb 7/7 - 147 lb 12.8 oz 6/15 - 149 lb 3.2 oz  NUTRITION DIAGNOSIS: Unintentional weight loss stable    MONITORING, EVALUATION, GOAL: weight trends, intake   NEXT VISIT: via telephone ~ 2 weeks

## 2021-03-17 NOTE — Patient Instructions (Signed)
[facility]  Discharge Instructions: Thank you for choosing [facility] to provide your oncology and hematology care.  If you have a lab appointment with the Cancer Center, please come in thru the Main Entrance and check in at the main information desk.  Wear comfortable clothing and clothing appropriate for easy access to any Portacath or PICC line.   We strive to give you quality time with your provider. You may need to reschedule your appointment if you arrive late (15 or more minutes).  Arriving late affects you and other patients whose appointments are after yours.  Also, if you miss three or more appointments without notifying the office, you may be dismissed from the clinic at the provider's discretion.      For prescription refill requests, have your pharmacy contact our office and allow 72 hours for refills to be completed.        To help prevent nausea and vomiting after your treatment, we encourage you to take your nausea medication as directed.  BELOW ARE SYMPTOMS THAT SHOULD BE REPORTED IMMEDIATELY: *FEVER GREATER THAN 100.4 F (38 C) OR HIGHER *CHILLS OR SWEATING *NAUSEA AND VOMITING THAT IS NOT CONTROLLED WITH YOUR NAUSEA MEDICATION *UNUSUAL SHORTNESS OF BREATH *UNUSUAL BRUISING OR BLEEDING *URINARY PROBLEMS (pain or burning when urinating, or frequent urination) *BOWEL PROBLEMS (unusual diarrhea, constipation, pain near the anus) TENDERNESS IN MOUTH AND THROAT WITH OR WITHOUT PRESENCE OF ULCERS (sore throat, sores in mouth, or a toothache) UNUSUAL RASH, SWELLING OR PAIN  UNUSUAL VAGINAL DISCHARGE OR ITCHING   Items with * indicate a potential emergency and should be followed up as soon as possible or go to the Emergency Department if any problems should occur.  Please show the CHEMOTHERAPY ALERT CARD or IMMUNOTHERAPY ALERT CARD at check-in to the Emergency Department and triage nurse.  Should you have questions after your visit or need to cancel  or reschedule your appointment, please contact [facility] [phone]  and follow the prompts.  Office hours are 8:00 a.m. to 4:30 p.m. Monday - Friday. Please note that voicemails left after 4:00 p.m. may not be returned until the following business day.  We are closed weekends and major holidays. You have access to a nurse at all times for urgent questions. Please call the main number to the clinic [phone] and follow the prompts.  For any non-urgent questions, you may also contact your provider using MyChart. We now offer e-Visits for anyone 18 and older to request care online for non-urgent symptoms. For details visit [email or web address].   Also download the MyChart app! Go to the app store, search "MyChart", open the app, select [facility], and log in with your MyChart username and password.  Due to Covid, a mask is required upon entering the hospital/clinic. If you do not have a mask, one will be given to you upon arrival. For doctor visits, patients may have 1 support person aged 18 or older with them. For treatment visits, patients cannot have anyone with them due to current Covid guidelines and our immunocompromised population.  

## 2021-03-17 NOTE — Progress Notes (Signed)
Patient presents today for Keytruda infusion.  Labs reviewed by Dr. Delton Coombes during her office visit.  All labs within treatment parameters.  We will proceed with treatment today per MD orders.

## 2021-03-17 NOTE — Progress Notes (Signed)
Patient assessed and labs reviewed by Dr. Delton Coombes. Okay to proceed with treatment today per Dr. Delton Coombes. Primary RNs and Pharmacy made aware.

## 2021-03-31 ENCOUNTER — Telehealth (HOSPITAL_COMMUNITY): Payer: Self-pay | Admitting: Dietician

## 2021-03-31 ENCOUNTER — Ambulatory Visit (HOSPITAL_COMMUNITY)
Admission: RE | Admit: 2021-03-31 | Discharge: 2021-03-31 | Disposition: A | Discharge status: Home / Self Care | Payer: Medicare Other | Source: Ambulatory Visit | Attending: Hematology | Admitting: Hematology

## 2021-03-31 ENCOUNTER — Other Ambulatory Visit: Payer: Self-pay

## 2021-03-31 ENCOUNTER — Encounter (HOSPITAL_COMMUNITY): Payer: Medicare Other | Admitting: Dietician

## 2021-03-31 DIAGNOSIS — K769 Liver disease, unspecified: Secondary | ICD-10-CM | POA: Diagnosis not present

## 2021-03-31 DIAGNOSIS — C184 Malignant neoplasm of transverse colon: Secondary | ICD-10-CM | POA: Insufficient documentation

## 2021-03-31 DIAGNOSIS — R1900 Intra-abdominal and pelvic swelling, mass and lump, unspecified site: Secondary | ICD-10-CM | POA: Diagnosis not present

## 2021-03-31 DIAGNOSIS — C189 Malignant neoplasm of colon, unspecified: Secondary | ICD-10-CM | POA: Diagnosis not present

## 2021-03-31 DIAGNOSIS — Z9889 Other specified postprocedural states: Secondary | ICD-10-CM | POA: Diagnosis not present

## 2021-03-31 DIAGNOSIS — R590 Localized enlarged lymph nodes: Secondary | ICD-10-CM | POA: Diagnosis not present

## 2021-03-31 DIAGNOSIS — R918 Other nonspecific abnormal finding of lung field: Secondary | ICD-10-CM | POA: Diagnosis not present

## 2021-03-31 MED ORDER — IOHEXOL 350 MG/ML SOLN
75.0000 mL | Freq: Once | INTRAVENOUS | Status: AC | PRN
  Administered 2021-03-31: 75 mL via INTRAVENOUS

## 2021-03-31 NOTE — Telephone Encounter (Signed)
Nutrition Follow-up:  Patient is receiving Keytruda for metastatic colon cancer.   Spoke with patient via telephone. She reports ongoing diarrhea, but this has improved some. Patient states nothing she does or does not eat is going to matter while on Keytruda and she has accepted that. Patient is taking half tablets of Imodium as needed, reports diarrhea has been less frequent. Patient reports she is eating better, she is drinking body armor and a hydration drink. Patient continues to drink Boost, but not everyday. Her PCP directed to stop drinking dairy. Patient has been able to get out more frequently, she has been walking, states she is trying to gain some muscle back.    Medications: reviewed   Labs: reviewed  Anthropometrics: Weight 149 lb 9.6 oz on 9/8 stable   7/28 - 149 lb 7/7 - 147 lb 12.8 oz 6/15 - 149 lb 3.2 oz   NUTRITION DIAGNOSIS: Unintentional weight loss stable   INTERVENTION:  Encouraged small frequent meals and snacks with adequate calories and protein Suggested pt try [employer]life milk - lactose free, 13 g protein per serving  Continue Boost supplements as tolerated, suggested [employer] as alternate (150 kcal, 30 g protein) Continue Imodium for diarrhea as needed Encouraged activity as able Patient has contact information    MONITORING, EVALUATION, GOAL: weight trends, intake   NEXT VISIT: To be scheduled

## 2021-04-05 DIAGNOSIS — H2512 Age-related nuclear cataract, left eye: Secondary | ICD-10-CM | POA: Diagnosis not present

## 2021-04-06 NOTE — Progress Notes (Signed)
[place], [place] [address]   CLINIC:  Medical Oncology/Hematology  PCP:  [facility], [place] / [place] [address] [phone]   REASON FOR VISIT:  Follow-up for transverse colon adenocarcinoma  PRIOR THERAPY:  1. Right hemicolectomy on 12/29/2013. 2. FOLFOX x 3 cycles from 02/10/2014 to 03/10/2014, stopped secondary to poor tolerance  NGS Results: MSI high, MMR deficient, BRAF V600E, TMB high  CURRENT THERAPY: Keytruda every 3 weeks  BRIEF ONCOLOGIC HISTORY:  Oncology History  Adenocarcinoma of transverse colon ([place]) (Resolved)  12/09/2013 Initial Diagnosis   1. Colon, biopsy, proximal transverse mass INVASIVE ADENOCARCINOMA.   12/29/2013 Definitive Surgery   Colon, segmental resection for tumor, right - INVASIVE COLORECTAL ADENOCARCINOMA, 7 CM, EXTENDING INTO PERICOLONIC CONNECTIVE TISSUE. - METASTATIC CARCINOMA IN 6 OF 33 LYMPH NODES (6/33).   02/10/2014 - 03/10/2014 Adjuvant Chemotherapy   FOLFOX x 3 cycles   03/11/2014 Adverse Reaction   Patient called reporting diarrhea despite dose reduction and she wants to stop therapy.  Patient seen on 03/24/14 to discuss other treatment options and she stands by her decision to stop all therapy.   04/01/2014 Surgery   Port-a-cath removal by Dr. Arnoldo Morale.   01/12/2015 PET scan   Mild abdominal mesenteric LAD show hypermetabolic activity, 7 mm indeterminate pulm nodule in sup segment of RLL shows no assoc metabolic activity   9/52/8413 PET scan   Mild progression of nodal mets in anterior mid abdominal mesentery, new nodal mets at L thoracic inlet. stable 6 mm nodule in posterior RLL, unchanged since 2015   06/22/2015 PET scan   Resolution of metabolic activity and decreased size of mild LAD at L thoracic inlet, stable mild mid abd hypermetabolic mesenteric LAD, stable 6 mm posterior LLL pulm nodule without metabolic activity   2/44/0102 Imaging   MRI lumbar spine L4-L5 disc  degenerated, broad based disc hernation, B facet arthropathy with hypertophy and edema, stenosis of lateral recesses that could cause neural compression   10/18/2016 Imaging   CT CAP- 1. Several small ground-glass pulmonary nodules are present in the lungs and are stable over the past 9 months, but several are mildly larger than they were in 2015. Low-grade adenocarcinoma can sometimes have this appearance and surveillance is likely warranted. 2. There is a cluster of nodal tissue in the central mesentery, maximum short axis diameter 1.3 cm. This nodal cluster is relatively low in density and not appreciably changed from the prior exam. Given the lack of change is may represent quiescent residua of malignancy, but again, surveillance is likely warranted. 3. The left-sided rib fractures are more numerous than was revealed on the prior radiographs ; there are nondisplaced healing fractures of the left fifth, sixth, seventh, eighth, ninth, and tenth ribs. 4. Mild cardiomegaly although part of this appearance may be due to pectus excavatum. 5. Several additional small pulmonary nodules are stable from 2015 and considered benign. 6. Right hemicolectomy and sigmoid colon diverticulosis. 7. Small right paraumbilical hernia containing adipose tissue. 8. Lower lumbar spondylosis and degenerative disc disease potentially with mild impingement at L4-5.   04/25/2017 Imaging   CT C/A/P: Scattered solid pulmonary nodules measure up to 6 mm in the right lower lobe, unchanged. There are a few scattered ground-glass nodules measuring up to 8 mm in the right upper lobe, also stable. Distal perigastric lymph nodes are stable. No additional evidence of metastatic disease.   Metastatic colon cancer to liver ([place])  11/04/2020 Initial Diagnosis  Metastatic colon cancer to liver ([facility])   11/10/2020 - 12/10/2020 Chemotherapy          01/13/2021 -  Chemotherapy    Patient is on Treatment Plan: COLORECTAL  PEMBROLIZUMAB Q21D         CANCER STAGING: Cancer Staging No matching staging information was found for the patient.  INTERVAL HISTORY:  Amanda Norris, a 75 y.o. female, returns for routine follow-up and consideration for next cycle of chemotherapy. Amanda Norris was last seen on 03/17/2021.  Due for cycle #5 of Keytruda today.   Overall, she tells me she has been feeling pretty well. She denies fatigue and reports constant chronic diarrhea that is stable. She reports stable dry skin and denies rash and itching. She reports good appetite, and the burning in her feet has resolved without needing medication. She is not currently drinking Boost or Ensure. She denies nausea and vomiting. She reports occasionally feeling a lump in of the left side of her abdomen after eating.   Overall, she feels ready for next cycle of chemo today.   REVIEW OF SYSTEMS:  Review of Systems  Constitutional:  Negative for appetite change (75%) and fatigue (80%).  HENT:   Positive for lump/mass (L side abdomen; occasional).   Gastrointestinal:  Positive for diarrhea. Negative for nausea and vomiting.  Skin:  Negative for itching and rash.  Neurological:  Negative for numbness.  All other systems reviewed and are negative.  PAST MEDICAL/SURGICAL HISTORY:  Past Medical History:  Diagnosis Date   Arthritis    wrist and thumbs   Colon cancer (HCC)    PONV (postoperative nausea and vomiting)    Port-A-Cath in place 11/09/2020   Ruptured lumbar disc    L1-L5 per patient report   Sciatica of left side    Past Surgical History:  Procedure Laterality Date   COLONOSCOPY N/A 12/12/2013   Procedure: COLONOSCOPY;  Surgeon: Malissa Hippo, MD;  [facility];  Service: Endoscopy;  Laterality: N/A;  730   COLONOSCOPY N/A 01/20/2015   Procedure: COLONOSCOPY;  Surgeon: Malissa Hippo, MD;  [facility]: [facility];  Service: Endoscopy;  Laterality: N/A;  1030   COLONOSCOPY N/A 03/28/2018   Procedure: COLONOSCOPY;   Surgeon: Malissa Hippo, MD;  [facility];  Service: Endoscopy;  Laterality: N/A;  1015   EUS N/A 12/18/2013   Procedure: UPPER ENDOSCOPIC ULTRASOUND (EUS) LINEAR;  Surgeon: Rachael Fee, MD;  Location: [facility];  Service: Endoscopy;  Laterality: N/A;   herniated disc     1996-[id number]   IR IMAGING GUIDED PORT INSERTION  10/14/2020   IR US GUIDANCE  10/14/2020   PARTIAL COLECTOMY N/A 12/29/2013   Procedure: RIGHT HEMICOLECTOMY;  Surgeon: Dalia Heading, MD;  Location: [facility];  Service: General;  Laterality: N/A;   POLYPECTOMY  03/28/2018   Procedure: POLYPECTOMY;  Surgeon: Malissa Hippo, MD;  [facility];  Service: Endoscopy;;  transverse colon (hot snare);   PORT-A-CATH REMOVAL Right 04/01/2014   Procedure: MINOR REMOVAL PORT-A-CATH;  Surgeon: Dalia Heading, MD;  Location: [facility];  Service: General;  Laterality: Right;   PORTACATH PLACEMENT Right 02/04/2014   Procedure: INSERTION PORT-A-CATH;  Surgeon: Dalia Heading, MD;  Location: [facility];  Service: General;  Laterality: Right;    SOCIAL HISTORY:  Social History   Socioeconomic History   Marital status: Married    Spouse name: Not on file   Number of children: Not on file  Years of education: Not on file   Highest education level: Not on file  Occupational History   Not on file  Tobacco Use   Smoking status: Former    Packs/day: 0.25    Years: 5.00    Pack years: 1.25    Types: Cigarettes    Quit date: 04/19/1959    Years since quitting: 62.0   Smokeless tobacco: Never   Tobacco comments:    smoked for 5 years as teenager  Vaping Use   Vaping Use: Never used  Substance and Sexual Activity   Alcohol use: No   Drug use: No   Sexual activity: Yes    Birth control/protection: Post-menopausal  Other Topics Concern   Not on file  Social History Narrative   Not on file   Social Determinants of Health   Financial Resource Strain: Low Risk    Difficulty of Paying Living Expenses: Not hard at  all  Food Insecurity: No Food Insecurity   Worried About Programme researcher, broadcasting/film/video in the Last Year: Never true   Ran Out of Food in the Last Year: Never true  Transportation Needs: No Transportation Needs   Lack of Transportation (Medical): No   Lack of Transportation (Non-Medical): No  Physical Activity: Inactive   Days of Exercise per Week: 0 days   Minutes of Exercise per Session: 0 min  Stress: Stress Concern Present   Feeling of Stress : To some extent  Social Connections: Press photographer of Communication with Friends and Family: More than three times a week   Frequency of Social Gatherings with Friends and Family: More than three times a week   Attends Religious Services: More than 4 times per year   Active Member of [employer] or Organizations: Yes   Attends Engineer, structural: More than 4 times per year   Marital Status: Married  Catering manager Violence: Not At Risk   Fear of Current or Ex-Partner: No   Emotionally Abused: No   Physically Abused: No   Sexually Abused: No    FAMILY HISTORY:  Family History  Problem Relation Age of Onset   Diabetes type II Mother    Dementia Father     CURRENT MEDICATIONS:  Current Outpatient Medications  Medication Sig Dispense Refill   acetaminophen (TYLENOL) 500 MG tablet Take 500 mg by mouth every 6 (six) hours as needed for moderate pain or headache.     aspirin 81 MG EC tablet Take 1 tablet (81 mg total) by mouth daily. Swallow whole. 30 tablet 12   Bioflavonoid Products (ESTER C PO) Take 1,000 mg by mouth daily.     Cholecalciferol (D3-1000) 25 MCG (1000 UT) capsule Take 125 Units by mouth daily.     Cranberry-Vitamin C-Vitamin E 4200-20-3 MG-MG-UNIT CAPS Take 500 mg by mouth daily.     Garlic 1000 MG CAPS Take 1,000 mg by mouth daily.      hydrocortisone-pramoxine (PROCTOFOAM-HC) rectal foam Place 1 applicator rectally 2 (two) times daily. 30 g 0   Multiple Vitamin (MULTIVITAMIN) tablet Take 1 tablet by  mouth daily.     Omega-3 Fatty Acids (FISH OIL) 1200 MG CAPS Take 1,000 mg by mouth daily.     ondansetron (ZOFRAN ODT) 8 MG disintegrating tablet Take 1 tablet (8 mg total) by mouth every 8 (eight) hours as needed for nausea or vomiting. 30 tablet 3   OVER THE COUNTER MEDICATION Take 1,000 mg by mouth daily. [place] 1000 mg daily  OVER THE COUNTER MEDICATION Take 1,000 mg by mouth daily. L-Lysine $RemoveBeforeD'1000mg'dZHXoNbOlSLvWu$  po daily     pramoxine-hydrocortisone (PROCTOCREAM-HC) 1-1 % rectal cream Place 1 application rectally 2 (two) times daily. 30 g 0   zinc gluconate 50 MG tablet Take 50 mg by mouth daily.     No current facility-administered medications for this visit.    ALLERGIES:  Allergies  Allergen Reactions   Sulfa Antibiotics Other (See Comments)    "Sores in my mouth"    PHYSICAL EXAM:  Performance status (ECOG): 1 - Symptomatic but completely ambulatory  There were no vitals filed for this visit. Wt Readings from Last 3 Encounters:  03/17/21 149 lb 9.6 oz (67.9 kg)  02/03/21 149 lb (67.6 kg)  01/13/21 147 lb 12.8 oz (67 kg)   Physical Exam Vitals reviewed.  Constitutional:      Appearance: Normal appearance.  Cardiovascular:     Rate and Rhythm: Normal rate and regular rhythm.     Pulses: Normal pulses.     Heart sounds: Normal heart sounds.  Pulmonary:     Effort: Pulmonary effort is normal.     Breath sounds: Normal breath sounds.  Abdominal:     General: A surgical scar is present.     Palpations: Abdomen is soft. There is no hepatomegaly, splenomegaly or mass.     Tenderness: There is no abdominal tenderness.  Neurological:     General: No focal deficit present.     Mental Status: She is alert and oriented to person, place, and time.  Psychiatric:        Mood and Affect: Mood normal.        Behavior: Behavior normal.    LABORATORY DATA:  I have reviewed the labs as listed.  CBC Latest Ref Rng & Units 03/17/2021 02/24/2021 02/03/2021  WBC 4.0 - 10.5 K/uL 6.8 10.3 8.6   Hemoglobin 12.0 - 15.0 g/dL 13.2 12.9 13.0  Hematocrit 36.0 - 46.0 % 42.8 41.0 41.0  Platelets 150 - 400 K/uL 215 215 286   CMP Latest Ref Rng & Units 03/17/2021 02/24/2021 02/03/2021  Glucose 70 - 99 mg/dL 93 100(H) 112(H)  BUN 8 - 23 mg/dL $Remove'11 16 15  'HLsKQVK$ Creatinine 0.44 - 1.00 mg/dL 0.62 0.58 0.67  Sodium 135 - 145 mmol/L 137 134(L) 134(L)  Potassium 3.5 - 5.1 mmol/L 4.1 4.4 4.3  Chloride 98 - 111 mmol/L 103 100 102  CO2 22 - 32 mmol/L $RemoveB'25 27 25  'TzCprAKS$ Calcium 8.9 - 10.3 mg/dL 9.3 9.7 9.1  Total Protein 6.5 - 8.1 g/dL 7.7 7.7 7.6  Total Bilirubin 0.3 - 1.2 mg/dL 0.5 0.4 0.4  Alkaline Phos 38 - 126 U/L 73 70 76  AST 15 - 41 U/L $Remo'21 25 19  'XWEEi$ ALT 0 - 44 U/L $Remo'14 19 16    'gbUUb$ DIAGNOSTIC IMAGING:  I have independently reviewed the scans and discussed with the patient. CT CHEST ABDOMEN PELVIS W CONTRAST  Result Date: 04/02/2021 CLINICAL DATA:  Metastatic colon cancer, assess treatment response EXAM: CT CHEST, ABDOMEN, AND PELVIS WITH CONTRAST TECHNIQUE: Multidetector CT imaging of the chest, abdomen and pelvis was performed following the standard protocol during bolus administration of intravenous contrast. CONTRAST:  9mL OMNIPAQUE IOHEXOL 350 MG/ML SOLN, additional oral enteric contrast COMPARISON:  01/31/2021 FINDINGS: CT CHEST FINDINGS Cardiovascular: Right chest port catheter. Normal heart size. No pericardial effusion. Mediastinum/Nodes: Numerous enlarged, hypodense pretracheal, prevascular, subcarinal, and left supraclavicular lymph nodes are not significantly changed, index subcarinal node measuring 3.3 x 1.7 cm, previously  3.4 x 1.9 cm (series 2, image 33). Index pretracheal node measures 2.4 x 1.8 cm, previously 2.6 x 1.7 cm (series 2, image 27). Thyroid gland, trachea, and esophagus demonstrate no significant findings. Lungs/Pleura: Numerous small pulmonary nodules throughout the lungs are not significantly changed, index nodule of the right lower lobe measuring 1.3 x 0.9 cm (series 4, image 86). Index  nodule of the left lower lobe measures 1.6 x 1.5 cm (series 4, image 118). No pleural effusion or pneumothorax. Musculoskeletal: No chest wall mass or suspicious bone lesions identified. Pectus deformity. CT ABDOMEN PELVIS FINDINGS Hepatobiliary: Hypodense lesion of the anterior liver is unchanged measuring 2.7 x 2.0 cm (series 2, image 63). No gallstones, gallbladder wall thickening, or biliary dilatation. Pancreas: Unremarkable. No pancreatic ductal dilatation or surrounding inflammatory changes. Spleen: Normal in size without significant abnormality. Adrenals/Urinary Tract: Adrenal glands are unremarkable. Kidneys are normal, without renal calculi, solid lesion, or hydronephrosis. Bladder is unremarkable. Stomach/Bowel: Stomach is within normal limits. Redemonstrated postoperative findings right hemicolectomy and ileocolic reanastomosis. No evidence of bowel wall thickening, distention, or inflammatory changes. Vascular/Lymphatic: No significant vascular findings are present. Numerous enlarged paraesophageal, retroperitoneal, aortocaval, celiac axis, and mesenteric lymph nodes with a soft tissue mass in the central small bowel mesentery are not significantly changed. Mesenteric soft tissue mass measures 5.9 x 4.5 cm, previously 5.8 x 4.5 cm (series 2, image 75). Index left retroperitoneal node measures 2.9 x 2.2 cm (series 2, image 68). Reproductive: No mass or other abnormality. Other: No abdominal wall hernia or abnormality. No abdominopelvic ascites. Musculoskeletal: No acute or significant osseous findings. IMPRESSION: 1. Numerous enlarged, hypodense lymph nodes throughout the chest, abdomen, and pelvis are not significantly changed. 2. Numerous small pulmonary nodules, not significantly changed. 3. Soft tissue mass in the central small bowel mesentery, not significantly changed. 4. Hypodense liver lesion, unchanged. 5. Above constellation of findings is consistent with stable metastatic disease. 6. Status  post right hemicolectomy and ileocolic reanastomosis. Electronically Signed   By: Eddie Candle M.D.   On: 04/02/2021 12:46     ASSESSMENT:  1.  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 - Status post 3 cycles of FOLFOX from 02/10/2014 through 03/10/2014, discontinued secondary to poor tolerance.  DPD was negative. -Last colonoscopy on 03/28/2018 patent ileo-colonic anastomosis.  8 mm polyp in the transverse colon.  Diverticulosis in the sigmoid colon, descending colon. - CT scan on 11/04/2018 shows postoperative findings of right colectomy with redemonstrated mesenteric nodule/lymph node measuring 1.9 x 1.7 cm, unchanged from prior scan in October 2019.  No evidence of metastatic disease.  Stable solid and groundglass pulmonary nodules, stable over multiple prior exams. -CEA was 4.6 on 10/28/2018. -CTAP on 09/21/2020 showed numerous small pulmonary nodules throughout the lung bases, 5 mm.  Enlarged retrocrural periaortic lymph node in the lower chest.  Soft tissue nodule within the central mesentery substantially increased in size, encasing proximal superior mesenteric artery and measuring 5 x 4.3 cm.  Numerous retroperitoneal lymph nodes, largest aortocaval lymph node measuring 2.8 x 2.2 cm. -CT chest on 09/27/2020 shows a left thoracic inlet lymph node approximately 2.3 cm.  Mediastinal lymph nodes and multiple subcentimeter pulmonary nodules suggestive of metastatic disease. -Mesenteric lymph node biopsy on 09/28/2020 shows mucin and fibrosis. -Left supraclavicular lymph node biopsy on 10/14/2020-soft tissue with abundant mucin, rare fragments of atypical columnar epithelium. - 3 cycles of FOLFIRI and bevacizumab  dose reduced from 11/10/2020 through 12/08/2020. - Caris test-BRAF V600 E+, MMR deficient, MSI high, TMB high, HER2 negative, BRCA1/2 pathogenic variant on exon  14 and exon 9 respectively.  The report also suggested decreased benefit to FOLFOX and bevacizumab in the first-line metastatic setting. - Cycle 1 of Keytruda on 01/13/2021.   2.  Health maintenance: -Mammogram dated 05/16/2018 was BI-RADS Category 1.   PLAN:  1.  Metastatic colon cancer to the liver, lungs and lymph nodes: - She has completed 4 cycles of Keytruda. - We have reviewed CT CAP from 03/31/2021 and compared it with the prior scan.  There is slight improvement in the lymph nodes in the chest, abdomen and pelvis.  No new lesions.  Liver lesion and lung nodules have not substantially changed. - She does not have any immunotherapy related side effects.  Labs from today shows normal LFTs and CBC.  TSH was 2.06.  Last CEA was 31.7, downtrending.  CEA from today is pending. - She reports occasional feeling of a lump on the left side of her umbilicus after eating which disappears within an hour or so.  I could not feel any mass at this time.  We have also reviewed CT images. - Proceed with treatment today and in 3 weeks.  RTC 6 weeks for follow-up.  Plan to repeat scan in 4 months.   2.  Diarrhea: - Baseline diarrhea about 3-4 watery bowel movements per day for the last 1 year stable.  Well controlled with Imodium.   3.  Peripheral neuropathy: - At last visit she reported burning sensation in the feet when she went to the bathroom at night.  She no longer has it at this time.   4.  Nutrition: - Her weight is stable.  She is able to eat well.  She discontinued nutritional supplement.   Orders placed this encounter:  No orders of the defined types were placed in this encounter.    Derek Jack, MD [facility] [phone]   I, Thana Ates, am acting as a scribe for Dr. Derek Jack.  I, Derek Jack MD, have reviewed the above documentation for accuracy and completeness, and I agree with the above.

## 2021-04-07 ENCOUNTER — Inpatient Hospital Stay (HOSPITAL_COMMUNITY): Payer: Medicare Other

## 2021-04-07 ENCOUNTER — Inpatient Hospital Stay (HOSPITAL_BASED_OUTPATIENT_CLINIC_OR_DEPARTMENT_OTHER): Payer: Medicare Other | Admitting: Hematology

## 2021-04-07 ENCOUNTER — Other Ambulatory Visit: Payer: Self-pay

## 2021-04-07 VITALS — BP 127/66 | HR 61 | Temp 97.9°F | Resp 18

## 2021-04-07 VITALS — BP 151/89 | HR 89 | Temp 98.1°F | Resp 18 | Wt 149.6 lb

## 2021-04-07 DIAGNOSIS — C184 Malignant neoplasm of transverse colon: Secondary | ICD-10-CM

## 2021-04-07 DIAGNOSIS — C189 Malignant neoplasm of colon, unspecified: Secondary | ICD-10-CM

## 2021-04-07 DIAGNOSIS — Z5112 Encounter for antineoplastic immunotherapy: Secondary | ICD-10-CM | POA: Diagnosis not present

## 2021-04-07 DIAGNOSIS — C787 Secondary malignant neoplasm of liver and intrahepatic bile duct: Secondary | ICD-10-CM | POA: Diagnosis not present

## 2021-04-07 DIAGNOSIS — Z95828 Presence of other vascular implants and grafts: Secondary | ICD-10-CM

## 2021-04-07 DIAGNOSIS — E064 Drug-induced thyroiditis: Secondary | ICD-10-CM

## 2021-04-07 DIAGNOSIS — Z79899 Other long term (current) drug therapy: Secondary | ICD-10-CM | POA: Diagnosis not present

## 2021-04-07 LAB — COMPREHENSIVE METABOLIC PANEL
ALT: 15 U/L (ref 0–44)
AST: 23 U/L (ref 15–41)
Albumin: 4.2 g/dL (ref 3.5–5.0)
Alkaline Phosphatase: 73 U/L (ref 38–126)
Anion gap: 8 (ref 5–15)
BUN: 11 mg/dL (ref 8–23)
CO2: 29 mmol/L (ref 22–32)
Calcium: 9.9 mg/dL (ref 8.9–10.3)
Chloride: 102 mmol/L (ref 98–111)
Creatinine, Ser: 0.65 mg/dL (ref 0.44–1.00)
GFR, Estimated: >60 mL/min (ref >60)
Glucose, Bld: 99 mg/dL (ref 70–99)
Potassium: 4.8 mmol/L (ref 3.5–5.1)
Sodium: 139 mmol/L (ref 135–145)
Total Bilirubin: 0.3 mg/dL (ref 0.3–1.2)
Total Protein: 8 g/dL (ref 6.5–8.1)

## 2021-04-07 LAB — CBC WITH DIFFERENTIAL/PLATELET
Abs Immature Granulocytes: 0.02 10*3/uL (ref 0.00–0.07)
Basophils Absolute: 0 10*3/uL (ref 0.0–0.1)
Basophils Relative: 1 %
Eosinophils Absolute: 0.1 10*3/uL (ref 0.0–0.5)
Eosinophils Relative: 1 %
HCT: 42.2 % (ref 36.0–46.0)
Hemoglobin: 13.5 g/dL (ref 12.0–15.0)
Immature Granulocytes: 0 %
Lymphocytes Relative: 24 %
Lymphs Abs: 1.6 10*3/uL (ref 0.7–4.0)
MCH: 30.5 pg (ref 26.0–34.0)
MCHC: 32 g/dL (ref 30.0–36.0)
MCV: 95.3 fL (ref 80.0–100.0)
Monocytes Absolute: 0.6 10*3/uL (ref 0.1–1.0)
Monocytes Relative: 9 %
Neutro Abs: 4.4 10*3/uL (ref 1.7–7.7)
Neutrophils Relative %: 65 %
Platelets: 205 10*3/uL (ref 150–400)
RBC: 4.43 MIL/uL (ref 3.87–5.11)
RDW: 13.5 % (ref 11.5–15.5)
WBC: 6.8 10*3/uL (ref 4.0–10.5)
nRBC: 0 % (ref 0.0–0.2)

## 2021-04-07 LAB — TSH: TSH: 2.062 u[IU]/mL (ref 0.350–4.500)

## 2021-04-07 LAB — MAGNESIUM: Magnesium: 2.1 mg/dL (ref 1.7–2.4)

## 2021-04-07 MED ORDER — HEPARIN SOD (PORK) LOCK FLUSH 100 UNIT/ML IV SOLN
500.0000 [IU] | Freq: Once | INTRAVENOUS | Status: AC | PRN
  Administered 2021-04-07: 500 [IU]

## 2021-04-07 MED ORDER — SODIUM CHLORIDE 0.9 % IV SOLN: Freq: Once | INTRAVENOUS | Status: AC

## 2021-04-07 MED ORDER — SODIUM CHLORIDE 0.9 % IV SOLN
200.0000 mg | Freq: Once | INTRAVENOUS | Status: AC
  Administered 2021-04-07: 200 mg via INTRAVENOUS
  Filled 2021-04-07: qty 8

## 2021-04-07 MED ORDER — SODIUM CHLORIDE 0.9% FLUSH
10.0000 mL | INTRAVENOUS | Status: DC | PRN
Start: 2021-04-07 — End: 2021-04-07
  Administered 2021-04-07: 10 mL

## 2021-04-07 NOTE — Progress Notes (Signed)
Patient presents today for Keytruda infusion.  Patient is in satisfactory condition with no new complaints voiced. Vital signs are stable.  Labs reviewed by Dr. Delton Coombes during her office visit.  Labs are all within treatment parameters.  We will proceed with treatment per MD orders.    Patient tolerated treatment well with no complaints voiced.  Patient left ambulatory in stable condition.  Vital signs stable at discharge.  Follow up as scheduled.

## 2021-04-07 NOTE — Patient Instructions (Addendum)
[facility] Discharge Instructions  You were seen today by Dr. Katragadda. He went over your recent results and scans, and you received your treatment. Dr. Katragadda will see you back in 6 weeks for labs and follow up.   Thank you for choosing [facility] at [facility] to provide your oncology and hematology care.  To afford each patient quality time with our provider, please arrive at least 15 minutes before your scheduled appointment time.   If you have a lab appointment with the Cancer Center please come in thru the Main Entrance and check in at the main information desk  You need to re-schedule your appointment should you arrive 10 or more minutes late.  We strive to give you quality time with our providers, and arriving late affects you and other patients whose appointments are after yours.  Also, if you no show three or more times for appointments you may be dismissed from the clinic at the providers discretion.     Again, thank you for choosing [facility].  Our hope is that these requests will decrease the amount of time that you wait before being seen by our physicians.       _____________________________________________________________  Should you have questions after your visit to  Cancer Center, please contact our office at [phone] between the hours of 8:00 a.m. and 4:30 p.m.  Voicemails left after 4:00 p.m. will not be returned until the following business day.  For prescription refill requests, have your pharmacy contact our office and allow 72 hours.    Cancer Center Support Programs:   > Cancer Support Group  2nd Tuesday of the month 1pm-2pm, Journey Room   

## 2021-04-07 NOTE — Progress Notes (Signed)
Patient has been examined, vital signs and labs have been reviewed by Dr. Katragadda. ANC, Creatinine, LFTs, hemoglobin, and platelets are within treatment parameters per Dr. Katragadda. Patient may proceed with treatment per M.D.   

## 2021-04-07 NOTE — Patient Instructions (Signed)
[facility]  Discharge Instructions: Thank you for choosing [facility] to provide your oncology and hematology care.  If you have a lab appointment with the Cancer Center, please come in thru the Main Entrance and check in at the main information desk.  Wear comfortable clothing and clothing appropriate for easy access to any Portacath or PICC line.   We strive to give you quality time with your provider. You may need to reschedule your appointment if you arrive late (15 or more minutes).  Arriving late affects you and other patients whose appointments are after yours.  Also, if you miss three or more appointments without notifying the office, you may be dismissed from the clinic at the provider's discretion.      For prescription refill requests, have your pharmacy contact our office and allow 72 hours for refills to be completed.        To help prevent nausea and vomiting after your treatment, we encourage you to take your nausea medication as directed.  BELOW ARE SYMPTOMS THAT SHOULD BE REPORTED IMMEDIATELY: *FEVER GREATER THAN 100.4 F (38 C) OR HIGHER *CHILLS OR SWEATING *NAUSEA AND VOMITING THAT IS NOT CONTROLLED WITH YOUR NAUSEA MEDICATION *UNUSUAL SHORTNESS OF BREATH *UNUSUAL BRUISING OR BLEEDING *URINARY PROBLEMS (pain or burning when urinating, or frequent urination) *BOWEL PROBLEMS (unusual diarrhea, constipation, pain near the anus) TENDERNESS IN MOUTH AND THROAT WITH OR WITHOUT PRESENCE OF ULCERS (sore throat, sores in mouth, or a toothache) UNUSUAL RASH, SWELLING OR PAIN  UNUSUAL VAGINAL DISCHARGE OR ITCHING   Items with * indicate a potential emergency and should be followed up as soon as possible or go to the Emergency Department if any problems should occur.  Please show the CHEMOTHERAPY ALERT CARD or IMMUNOTHERAPY ALERT CARD at check-in to the Emergency Department and triage nurse.  Should you have questions after your visit or need to cancel  or reschedule your appointment, please contact [facility] [phone]  and follow the prompts.  Office hours are 8:00 a.m. to 4:30 p.m. Monday - Friday. Please note that voicemails left after 4:00 p.m. may not be returned until the following business day.  We are closed weekends and major holidays. You have access to a nurse at all times for urgent questions. Please call the main number to the clinic [phone] and follow the prompts.  For any non-urgent questions, you may also contact your provider using MyChart. We now offer e-Visits for anyone 18 and older to request care online for non-urgent symptoms. For details visit [email or web address].   Also download the MyChart app! Go to the app store, search "MyChart", open the app, select , and log in with your MyChart username and password.  Due to Covid, a mask is required upon entering the hospital/clinic. If you do not have a mask, one will be given to you upon arrival. For doctor visits, patients may have 1 support person aged 18 or older with them. For treatment visits, patients cannot have anyone with them due to current Covid guidelines and our immunocompromised population.  

## 2021-04-08 LAB — CEA: CEA: 25.5 ng/mL — ABNORMAL HIGH (ref 0.0–4.7)

## 2021-04-08 NOTE — Addendum Note (Signed)
Addended by: Joie Bimler on: 04/08/2021 09:38 AM   Modules accepted: Orders

## 2021-04-28 ENCOUNTER — Inpatient Hospital Stay (HOSPITAL_COMMUNITY): Payer: Medicare Other

## 2021-04-28 ENCOUNTER — Inpatient Hospital Stay (HOSPITAL_COMMUNITY): Payer: Medicare Other | Attending: Hematology

## 2021-04-28 ENCOUNTER — Encounter (HOSPITAL_COMMUNITY): Payer: Self-pay

## 2021-04-28 ENCOUNTER — Inpatient Hospital Stay (HOSPITAL_COMMUNITY): Payer: Medicare Other | Admitting: Dietician

## 2021-04-28 ENCOUNTER — Other Ambulatory Visit: Payer: Self-pay

## 2021-04-28 VITALS — BP 124/62 | HR 76 | Temp 98.6°F | Resp 18 | Wt 150.6 lb

## 2021-04-28 DIAGNOSIS — Z79899 Other long term (current) drug therapy: Secondary | ICD-10-CM | POA: Diagnosis not present

## 2021-04-28 DIAGNOSIS — C7800 Secondary malignant neoplasm of unspecified lung: Secondary | ICD-10-CM | POA: Diagnosis not present

## 2021-04-28 DIAGNOSIS — C189 Malignant neoplasm of colon, unspecified: Secondary | ICD-10-CM

## 2021-04-28 DIAGNOSIS — E064 Drug-induced thyroiditis: Secondary | ICD-10-CM

## 2021-04-28 DIAGNOSIS — Z5112 Encounter for antineoplastic immunotherapy: Secondary | ICD-10-CM | POA: Insufficient documentation

## 2021-04-28 DIAGNOSIS — C778 Secondary and unspecified malignant neoplasm of lymph nodes of multiple regions: Secondary | ICD-10-CM | POA: Diagnosis not present

## 2021-04-28 DIAGNOSIS — C787 Secondary malignant neoplasm of liver and intrahepatic bile duct: Secondary | ICD-10-CM | POA: Diagnosis not present

## 2021-04-28 DIAGNOSIS — Z95828 Presence of other vascular implants and grafts: Secondary | ICD-10-CM

## 2021-04-28 DIAGNOSIS — C184 Malignant neoplasm of transverse colon: Secondary | ICD-10-CM | POA: Insufficient documentation

## 2021-04-28 LAB — CBC WITH DIFFERENTIAL/PLATELET
Abs Immature Granulocytes: 0.03 10*3/uL (ref 0.00–0.07)
Basophils Absolute: 0.1 10*3/uL (ref 0.0–0.1)
Basophils Relative: 1 %
Eosinophils Absolute: 0.1 10*3/uL (ref 0.0–0.5)
Eosinophils Relative: 1 %
HCT: 41.8 % (ref 36.0–46.0)
Hemoglobin: 13.5 g/dL (ref 12.0–15.0)
Immature Granulocytes: 0 %
Lymphocytes Relative: 23 %
Lymphs Abs: 1.9 10*3/uL (ref 0.7–4.0)
MCH: 30.5 pg (ref 26.0–34.0)
MCHC: 32.3 g/dL (ref 30.0–36.0)
MCV: 94.6 fL (ref 80.0–100.0)
Monocytes Absolute: 0.9 10*3/uL (ref 0.1–1.0)
Monocytes Relative: 11 %
Neutro Abs: 5.2 10*3/uL (ref 1.7–7.7)
Neutrophils Relative %: 64 %
Platelets: 215 10*3/uL (ref 150–400)
RBC: 4.42 MIL/uL (ref 3.87–5.11)
RDW: 14 % (ref 11.5–15.5)
WBC: 8 10*3/uL (ref 4.0–10.5)
nRBC: 0 % (ref 0.0–0.2)

## 2021-04-28 LAB — COMPREHENSIVE METABOLIC PANEL
ALT: 16 U/L (ref 0–44)
AST: 24 U/L (ref 15–41)
Albumin: 4.3 g/dL (ref 3.5–5.0)
Alkaline Phosphatase: 67 U/L (ref 38–126)
Anion gap: 6 (ref 5–15)
BUN: 15 mg/dL (ref 8–23)
CO2: 27 mmol/L (ref 22–32)
Calcium: 9.7 mg/dL (ref 8.9–10.3)
Chloride: 101 mmol/L (ref 98–111)
Creatinine, Ser: 0.62 mg/dL (ref 0.44–1.00)
GFR, Estimated: >60 mL/min (ref >60)
Glucose, Bld: 101 mg/dL — ABNORMAL HIGH (ref 70–99)
Potassium: 4.7 mmol/L (ref 3.5–5.1)
Sodium: 134 mmol/L — ABNORMAL LOW (ref 135–145)
Total Bilirubin: 0.5 mg/dL (ref 0.3–1.2)
Total Protein: 8 g/dL (ref 6.5–8.1)

## 2021-04-28 LAB — MAGNESIUM: Magnesium: 2.4 mg/dL (ref 1.7–2.4)

## 2021-04-28 LAB — TSH: TSH: 2.408 u[IU]/mL (ref 0.350–4.500)

## 2021-04-28 MED ORDER — SODIUM CHLORIDE 0.9 % IV SOLN
200.0000 mg | Freq: Once | INTRAVENOUS | Status: AC
  Administered 2021-04-28: 200 mg via INTRAVENOUS
  Filled 2021-04-28: qty 8

## 2021-04-28 MED ORDER — HEPARIN SOD (PORK) LOCK FLUSH 100 UNIT/ML IV SOLN
500.0000 [IU] | Freq: Once | INTRAVENOUS | Status: AC | PRN
  Administered 2021-04-28: 500 [IU]

## 2021-04-28 MED ORDER — SODIUM CHLORIDE 0.9 % IV SOLN: Freq: Once | INTRAVENOUS | Status: AC

## 2021-04-28 MED ORDER — SODIUM CHLORIDE 0.9% FLUSH
10.0000 mL | INTRAVENOUS | Status: DC | PRN
Start: 2021-04-28 — End: 2021-04-28
  Administered 2021-04-28: 10 mL

## 2021-04-28 NOTE — Progress Notes (Signed)
Patient presents for [facility]. Patient tolerated chemotherapy with no complaints voiced. Side effects with management reviewed understanding verbalized. Port site clean and dry with no bruising or swelling noted at site. Good blood return noted before and after administration of chemotherapy. Band aid applied. Patient left in satisfactory condition with VSS and no s/s of distress noted.

## 2021-04-28 NOTE — Progress Notes (Signed)
Nutrition Follow-up:  Patient receiving Keytruda for metastatic colon cancer.   Met with patient during infusion. She reports good appetite and eating well. Patient reports ongoing diarrhea, but she has "figured out" the correct dosage and timing before meals that works for her. Yesterday she ate sausage, gravy, biscuit, for breakfast, chicken tenders from [employer] for lunch, and bowl of cereal for dinner. Patient has not been drinking Boost supplements. Patient reports increased energy, she is walking most everyday.    Medications: reviewed  Labs: Na 134, Glucose 101  Anthropometrics: Weight 150 lb 9.6 oz today stable x 4 months  9/29 - 149 lb 9.6 oz 9/8 - 149 lb 9.6 oz 7/28 - 148 lb 7/7 - 147 lb 12.8 oz 6/15 - 149 lb 3.2 oz    NUTRITION DIAGNOSIS: Unintentional weight loss stable    INTERVENTION:  Continue eating high calorie high protein foods for weight maintenance Continue Imodium for diarrhea Encouraged activity as able Patient has contact information   MONITORING, EVALUATION, GOAL: weight trends, intake   NEXT VISIT: To be scheduled as needed

## 2021-04-28 NOTE — Patient Instructions (Signed)
[facility]  Discharge Instructions: ?Thank you for choosing [facility] to provide your oncology and hematology care.  ?If you have a lab appointment with the Cancer Center, please come in thru the Main Entrance and check in at the main information desk. ? ?Wear comfortable clothing and clothing appropriate for easy access to any Portacath or PICC line.  ? ?We strive to give you quality time with your provider. You may need to reschedule your appointment if you arrive late (15 or more minutes).  Arriving late affects you and other patients whose appointments are after yours.  Also, if you miss three or more appointments without notifying the office, you may be dismissed from the clinic at the provider?s discretion.    ?  ?For prescription refill requests, have your pharmacy contact our office and allow 72 hours for refills to be completed.   ? ?Today you received the following chemotherapy and/or immunotherapy agents Keytruda, return as scheduled.  ?  ?To help prevent nausea and vomiting after your treatment, we encourage you to take your nausea medication as directed. ? ?BELOW ARE SYMPTOMS THAT SHOULD BE REPORTED IMMEDIATELY: ?*FEVER GREATER THAN 100.4 F (38 ?C) OR HIGHER ?*CHILLS OR SWEATING ?*NAUSEA AND VOMITING THAT IS NOT CONTROLLED WITH YOUR NAUSEA MEDICATION ?*UNUSUAL SHORTNESS OF BREATH ?*UNUSUAL BRUISING OR BLEEDING ?*URINARY PROBLEMS (pain or burning when urinating, or frequent urination) ?*BOWEL PROBLEMS (unusual diarrhea, constipation, pain near the anus) ?TENDERNESS IN MOUTH AND THROAT WITH OR WITHOUT PRESENCE OF ULCERS (sore throat, sores in mouth, or a toothache) ?UNUSUAL RASH, SWELLING OR PAIN  ?UNUSUAL VAGINAL DISCHARGE OR ITCHING  ? ?Items with * indicate a potential emergency and should be followed up as soon as possible or go to the Emergency Department if any problems should occur. ? ?Please show the CHEMOTHERAPY ALERT CARD or IMMUNOTHERAPY ALERT CARD at check-in to  the Emergency Department and triage nurse. ? ?Should you have questions after your visit or need to cancel or reschedule your appointment, please contact [facility] [phone]  and follow the prompts.  Office hours are 8:00 a.m. to 4:30 p.m. Monday - Friday. Please note that voicemails left after 4:00 p.m. may not be returned until the following business day.  We are closed weekends and major holidays. You have access to a nurse at all times for urgent questions. Please call the main number to the clinic [phone] and follow the prompts. ? ?For any non-urgent questions, you may also contact your provider using MyChart. We now offer e-Visits for anyone 18 and older to request care online for non-urgent symptoms. For details visit [email or web address]. ?  ?Also download the MyChart app! Go to the app store, search "MyChart", open the app, select [facility], and log in with your MyChart username and password. ? ?Due to Covid, a mask is required upon entering the hospital/clinic. If you do not have a mask, one will be given to you upon arrival. For doctor visits, patients may have 1 support person aged 18 or older with them. For treatment visits, patients cannot have anyone with them due to current Covid guidelines and our immunocompromised population.  ?

## 2021-04-29 LAB — CEA: CEA: 17.5 ng/mL — ABNORMAL HIGH (ref 0.0–4.7)

## 2021-05-18 NOTE — Progress Notes (Signed)
[place], [place] [address]   CLINIC:  Medical Oncology/Hematology  PCP:  [facility], [place] / [place] [address] [phone]   REASON FOR VISIT:  Follow-up for transverse colon adenocarcinoma  PRIOR THERAPY:  1. Right hemicolectomy on 12/29/2013. 2. FOLFOX x 3 cycles from 02/10/2014 to 03/10/2014, stopped secondary to poor tolerance  NGS Results: MSI high, MMR deficient, BRAF V600E, TMB high  CURRENT THERAPY: Keytruda every 3 weeks  BRIEF ONCOLOGIC HISTORY:  Oncology History  Adenocarcinoma of transverse colon ([place]) (Resolved)  12/09/2013 Initial Diagnosis   1. Colon, biopsy, proximal transverse mass INVASIVE ADENOCARCINOMA.   12/29/2013 Definitive Surgery   Colon, segmental resection for tumor, right - INVASIVE COLORECTAL ADENOCARCINOMA, 7 CM, EXTENDING INTO PERICOLONIC CONNECTIVE TISSUE. - METASTATIC CARCINOMA IN 6 OF 33 LYMPH NODES (6/33).   02/10/2014 - 03/10/2014 Adjuvant Chemotherapy   FOLFOX x 3 cycles   03/11/2014 Adverse Reaction   Patient called reporting diarrhea despite dose reduction and she wants to stop therapy.  Patient seen on 03/24/14 to discuss other treatment options and she stands by her decision to stop all therapy.   04/01/2014 Surgery   Port-a-cath removal by Dr. Arnoldo Morale.   01/12/2015 PET scan   Mild abdominal mesenteric LAD show hypermetabolic activity, 7 mm indeterminate pulm nodule in sup segment of RLL shows no assoc metabolic activity   9/52/8413 PET scan   Mild progression of nodal mets in anterior mid abdominal mesentery, new nodal mets at L thoracic inlet. stable 6 mm nodule in posterior RLL, unchanged since 2015   06/22/2015 PET scan   Resolution of metabolic activity and decreased size of mild LAD at L thoracic inlet, stable mild mid abd hypermetabolic mesenteric LAD, stable 6 mm posterior LLL pulm nodule without metabolic activity   2/44/0102 Imaging   MRI lumbar spine L4-L5 disc  degenerated, broad based disc hernation, B facet arthropathy with hypertophy and edema, stenosis of lateral recesses that could cause neural compression   10/18/2016 Imaging   CT CAP- 1. Several small ground-glass pulmonary nodules are present in the lungs and are stable over the past 9 months, but several are mildly larger than they were in 2015. Low-grade adenocarcinoma can sometimes have this appearance and surveillance is likely warranted. 2. There is a cluster of nodal tissue in the central mesentery, maximum short axis diameter 1.3 cm. This nodal cluster is relatively low in density and not appreciably changed from the prior exam. Given the lack of change is may represent quiescent residua of malignancy, but again, surveillance is likely warranted. 3. The left-sided rib fractures are more numerous than was revealed on the prior radiographs ; there are nondisplaced healing fractures of the left fifth, sixth, seventh, eighth, ninth, and tenth ribs. 4. Mild cardiomegaly although part of this appearance may be due to pectus excavatum. 5. Several additional small pulmonary nodules are stable from 2015 and considered benign. 6. Right hemicolectomy and sigmoid colon diverticulosis. 7. Small right paraumbilical hernia containing adipose tissue. 8. Lower lumbar spondylosis and degenerative disc disease potentially with mild impingement at L4-5.   04/25/2017 Imaging   CT C/A/P: Scattered solid pulmonary nodules measure up to 6 mm in the right lower lobe, unchanged. There are a few scattered ground-glass nodules measuring up to 8 mm in the right upper lobe, also stable. Distal perigastric lymph nodes are stable. No additional evidence of metastatic disease.   Metastatic colon cancer to liver ([place])  11/04/2020 Initial Diagnosis  Metastatic colon cancer to liver (HCC)   11/10/2020 - 12/10/2020 Chemotherapy          01/13/2021 -  Chemotherapy   Patient is on Treatment Plan : COLORECTAL  Pembrolizumab q21d       CANCER STAGING: Cancer Staging No matching staging information was found for the patient.  INTERVAL HISTORY:  Ms. Itzabella H Selke, a 75 y.o. female, returns for routine follow-up and consideration for next cycle of chemotherapy. Brian was last seen on 09/29/20222.  Due for cycle #7 of Keytruda today.   Overall, she tells me she has been feeling pretty well. She reports watery diarrhea 3-4 times daily which is controlled with 1/2 tablet of Imodium up to 3 times daily. She denies SOB, fatigue, rash, headaches, vision changes, dry skin, itching, and cough. Her appetite is good, and the burning in her feet has resolved.   Overall, she feels ready for next cycle of chemo today.   REVIEW OF SYSTEMS:  Review of Systems  Constitutional:  Negative for appetite change and fatigue (75%).  Eyes:  Negative for eye problems.  Respiratory:  Negative for cough and shortness of breath.   Gastrointestinal:  Positive for diarrhea.  Skin:  Negative for itching and rash.  Neurological:  Negative for headaches and numbness.  All other systems reviewed and are negative.  PAST MEDICAL/SURGICAL HISTORY:  Past Medical History:  Diagnosis Date   Arthritis    wrist and thumbs   Colon cancer (HCC)    PONV (postoperative nausea and vomiting)    Port-A-Cath in place 11/09/2020   Ruptured lumbar disc    L1-L5 per patient report   Sciatica of left side    Past Surgical History:  Procedure Laterality Date   COLONOSCOPY N/A 12/12/2013   Procedure: COLONOSCOPY;  Surgeon: Najeeb U Rehman, MD;  [facility];  Service: Endoscopy;  Laterality: N/A;  730   COLONOSCOPY N/A 01/20/2015   Procedure: COLONOSCOPY;  Surgeon: Najeeb U Rehman, MD;  Location: [facility];  Service: Endoscopy;  Laterality: N/A;  1030   COLONOSCOPY N/A 03/28/2018   Procedure: COLONOSCOPY;  Surgeon: Rehman, Najeeb U, MD;  Location: [facility];  Service: Endoscopy;  Laterality: N/A;  1015   EUS N/A 12/18/2013    Procedure: UPPER ENDOSCOPIC ULTRASOUND (EUS) LINEAR;  Surgeon: Daniel P Jacobs, MD;  Location: [facility];  Service: Endoscopy;  Laterality: N/A;   herniated disc     1996-[id number]   IR IMAGING GUIDED PORT INSERTION  10/14/2020   IR US GUIDANCE  10/14/2020   PARTIAL COLECTOMY N/A 12/29/2013   Procedure: RIGHT HEMICOLECTOMY;  Surgeon: Mark A Jenkins, MD;  Location: [facility];  Service: General;  Laterality: N/A;   POLYPECTOMY  03/28/2018   Procedure: POLYPECTOMY;  Surgeon: Rehman, Najeeb U, MD;  Location: [facility];  Service: Endoscopy;;  transverse colon (hot snare);   PORT-A-CATH REMOVAL Right 04/01/2014   Procedure: MINOR REMOVAL PORT-A-CATH;  Surgeon: Mark A Jenkins, MD;  Location: [facility];  Service: General;  Laterality: Right;   PORTACATH PLACEMENT Right 02/04/2014   Procedure: INSERTION PORT-A-CATH;  Surgeon: Mark A Jenkins, MD;  Location: [facility];  Service: General;  Laterality: Right;    SOCIAL HISTORY:  Social History   Socioeconomic History   Marital status: Married    Spouse name: Not on file   Number of children: Not on file   Years of education: Not on file   Highest education level: Not on file  Occupational History     Not on file  Tobacco Use   Smoking status: Former    Packs/day: 0.25    Years: 5.00    Pack years: 1.25    Types: Cigarettes    Quit date: 04/19/1959    Years since quitting: 62.1   Smokeless tobacco: Never   Tobacco comments:    smoked for 5 years as teenager  Vaping Use   Vaping Use: Never used  Substance and Sexual Activity   Alcohol use: No   Drug use: No   Sexual activity: Yes    Birth control/protection: Post-menopausal  Other Topics Concern   Not on file  Social History Narrative   Not on file   Social Determinants of Health   Financial Resource Strain: Low Risk    Difficulty of Paying Living Expenses: Not hard at all  Food Insecurity: No Food Insecurity   Worried About Charity fundraiser in the Last Year: Never true   Ran Out of Food  in the Last Year: Never true  Transportation Needs: No Transportation Needs   Lack of Transportation (Medical): No   Lack of Transportation (Non-Medical): No  Physical Activity: Inactive   Days of Exercise per Week: 0 days   Minutes of Exercise per Session: 0 min  Stress: Stress Concern Present   Feeling of Stress : To some extent  Social Connections: Engineer, building services of Communication with Friends and Family: More than three times a week   Frequency of Social Gatherings with Friends and Family: More than three times a week   Attends Religious Services: More than 4 times per year   Active Member of [employer] or Organizations: Yes   Attends Music therapist: More than 4 times per year   Marital Status: Married  Human resources officer Violence: Not At Risk   Fear of Current or Ex-Partner: No   Emotionally Abused: No   Physically Abused: No   Sexually Abused: No    FAMILY HISTORY:  Family History  Problem Relation Age of Onset   Diabetes type II Mother    Dementia Father     CURRENT MEDICATIONS:  Current Outpatient Medications  Medication Sig Dispense Refill   acetaminophen (TYLENOL) 500 MG tablet Take 500 mg by mouth every 6 (six) hours as needed for moderate pain or headache.     aspirin 81 MG EC tablet Take 1 tablet (81 mg total) by mouth daily. Swallow whole. 30 tablet 12   BESIVANCE 0.6 % SUSP Place 1 drop into the left eye 3 (three) times daily.     Bioflavonoid Products (ESTER C PO) Take 1,000 mg by mouth daily.     Cholecalciferol (D3-1000) 25 MCG (1000 UT) capsule Take 125 Units by mouth daily.     Cranberry-Vitamin C-Vitamin E 4200-20-3 MG-MG-UNIT CAPS Take 500 mg by mouth daily.     DUREZOL 0.05 % EMUL Place 1 drop into the left eye 3 (three) times daily.     Garlic 3335 MG CAPS Take 1,000 mg by mouth daily.      hydrocortisone-pramoxine (PROCTOFOAM-HC) rectal foam Place 1 applicator rectally 2 (two) times daily. 30 g 0   Multiple Vitamin  (MULTIVITAMIN) tablet Take 1 tablet by mouth daily.     Omega-3 Fatty Acids (FISH OIL) 1200 MG CAPS Take 1,000 mg by mouth daily.     ondansetron (ZOFRAN ODT) 8 MG disintegrating tablet Take 1 tablet (8 mg total) by mouth every 8 (eight) hours as needed for nausea or vomiting. (Patient not  taking: No sig reported) 30 tablet 3   OVER THE COUNTER MEDICATION Take 1,000 mg by mouth daily. [place] 1000 mg daily     OVER THE COUNTER MEDICATION Take 1,000 mg by mouth daily. L-Lysine 1000mg po daily     pramoxine-hydrocortisone (PROCTOCREAM-HC) 1-1 % rectal cream Place 1 application rectally 2 (two) times daily. 30 g 0   PROLENSA 0.07 % SOLN Place 1 drop into the left eye 2 (two) times daily.     zinc gluconate 50 MG tablet Take 50 mg by mouth daily.     No current facility-administered medications for this visit.    ALLERGIES:  Allergies  Allergen Reactions   Sulfa Antibiotics Other (See Comments)    "Sores in my mouth"    PHYSICAL EXAM:  Performance status (ECOG): 1 - Symptomatic but completely ambulatory  There were no vitals filed for this visit. Wt Readings from Last 3 Encounters:  04/28/21 150 lb 9.6 oz (68.3 kg)  04/07/21 149 lb 9.6 oz (67.9 kg)  03/17/21 149 lb 9.6 oz (67.9 kg)   Physical Exam Vitals reviewed.  Constitutional:      Appearance: Normal appearance.  Cardiovascular:     Rate and Rhythm: Normal rate and regular rhythm.     Pulses: Normal pulses.     Heart sounds: Normal heart sounds.  Pulmonary:     Effort: Pulmonary effort is normal.     Breath sounds: Normal breath sounds.  Abdominal:     Palpations: Abdomen is soft. There is no hepatomegaly, splenomegaly or mass.     Tenderness: There is no abdominal tenderness.  Musculoskeletal:     Right lower leg: No edema.     Left lower leg: No edema.  Neurological:     General: No focal deficit present.     Mental Status: She is alert and oriented to person, place, and time.  Psychiatric:        Mood and Affect:  Mood normal.        Behavior: Behavior normal.    LABORATORY DATA:  I have reviewed the labs as listed.  CBC Latest Ref Rng & Units 04/28/2021 04/07/2021 03/17/2021  WBC 4.0 - 10.5 K/uL 8.0 6.8 6.8  Hemoglobin 12.0 - 15.0 g/dL 13.5 13.5 13.2  Hematocrit 36.0 - 46.0 % 41.8 42.2 42.8  Platelets 150 - 400 K/uL 215 205 215   CMP Latest Ref Rng & Units 04/28/2021 04/07/2021 03/17/2021  Glucose 70 - 99 mg/dL 101(H) 99 93  BUN 8 - 23 mg/dL 15 11 11  Creatinine 0.44 - 1.00 mg/dL 0.62 0.65 0.62  Sodium 135 - 145 mmol/L 134(L) 139 137  Potassium 3.5 - 5.1 mmol/L 4.7 4.8 4.1  Chloride 98 - 111 mmol/L 101 102 103  CO2 22 - 32 mmol/L 27 29 25  Calcium 8.9 - 10.3 mg/dL 9.7 9.9 9.3  Total Protein 6.5 - 8.1 g/dL 8.0 8.0 7.7  Total Bilirubin 0.3 - 1.2 mg/dL 0.5 0.3 0.5  Alkaline Phos 38 - 126 U/L 67 73 73  AST 15 - 41 U/L 24 23 21  ALT 0 - 44 U/L 16 15 14    DIAGNOSTIC IMAGING:  I have independently reviewed the scans and discussed with the patient. No results found.   ASSESSMENT:  1.  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 - Status post 3 cycles of FOLFOX from 02/10/2014 through 03/10/2014,   discontinued secondary to poor tolerance.  DPD was negative. -Last colonoscopy on 03/28/2018 patent ileo-colonic anastomosis.  8 mm polyp in the transverse colon.  Diverticulosis in the sigmoid colon, descending colon. - CT scan on 11/04/2018 shows postoperative findings of right colectomy with redemonstrated mesenteric nodule/lymph node measuring 1.9 x 1.7 cm, unchanged from prior scan in October 2019.  No evidence of metastatic disease.  Stable solid and groundglass pulmonary nodules, stable over multiple prior exams. -CEA was 4.6 on 10/28/2018. -CTAP on 09/21/2020 showed numerous small pulmonary nodules throughout the lung bases, 5 mm.  Enlarged retrocrural periaortic  lymph node in the lower chest.  Soft tissue nodule within the central mesentery substantially increased in size, encasing proximal superior mesenteric artery and measuring 5 x 4.3 cm.  Numerous retroperitoneal lymph nodes, largest aortocaval lymph node measuring 2.8 x 2.2 cm. -CT chest on 09/27/2020 shows a left thoracic inlet lymph node approximately 2.3 cm.  Mediastinal lymph nodes and multiple subcentimeter pulmonary nodules suggestive of metastatic disease. -Mesenteric lymph node biopsy on 09/28/2020 shows mucin and fibrosis. -Left supraclavicular lymph node biopsy on 10/14/2020-soft tissue with abundant mucin, rare fragments of atypical columnar epithelium. - 3 cycles of FOLFIRI and bevacizumab dose reduced from 11/10/2020 through 12/08/2020. - [facility] test-BRAF V600 E+, MMR deficient, MSI high, TMB high, HER2 negative, BRCA1/2 pathogenic variant on exon 14 and exon 9 respectively.  The report also suggested decreased benefit to FOLFOX and bevacizumab in the first-line metastatic setting. - Cycle 1 of Keytruda on 01/13/2021.   2.  Health maintenance: -Mammogram dated 05/16/2018 was BI-RADS Category 1.   PLAN:  1.  Metastatic colon cancer to the liver, lungs and lymph nodes: - CT CAP on 04/01/1999 4:22 cycles of Keytruda showed slight improvement in the lymph nodes in the chest, abdomen or pelvis.  No new lesions.  Liver lesion and lung nodules are not substantially changed. - She is not reporting any immunotherapy related side effects. - Reviewed her labs today which showed normal LFTs.  CBC was normal.  CEA is continuing to trend down.  Latest CEA was 17.5.  TSH was 1.457. - Continue Keytruda today and in 3 weeks.  RTC 6 weeks for toxicity assessment.  Plan to repeat CT scan towards the end of January.   2.  Diarrhea: - Baseline diarrhea about 3-4 watery bowel movements per day for the last 1 year is stable. - Continue Imodium half tablet anywhere between 0-3/day.   3.  Peripheral neuropathy: - She  no longer has burning sensation in the feet.   4.  Nutrition: - Her weight is stable.  She is eating well.  She is no longer requiring nutritional supplement.    Orders placed this encounter:  No orders of the defined types were placed in this encounter.    Derek Jack, MD [facility] [phone]   I, Thana Ates, am acting as a scribe for Dr. Derek Jack.  I, Derek Jack MD, have reviewed the above documentation for accuracy and completeness, and I agree with the above.

## 2021-05-19 ENCOUNTER — Other Ambulatory Visit: Payer: Self-pay

## 2021-05-19 ENCOUNTER — Inpatient Hospital Stay (HOSPITAL_COMMUNITY): Payer: Medicare Other

## 2021-05-19 ENCOUNTER — Inpatient Hospital Stay (HOSPITAL_COMMUNITY): Payer: Medicare Other | Attending: Hematology

## 2021-05-19 ENCOUNTER — Inpatient Hospital Stay (HOSPITAL_BASED_OUTPATIENT_CLINIC_OR_DEPARTMENT_OTHER): Payer: Medicare Other | Admitting: Hematology

## 2021-05-19 VITALS — BP 137/63 | HR 64 | Temp 96.6°F | Resp 18 | Wt 150.4 lb

## 2021-05-19 VITALS — BP 103/71 | HR 54 | Temp 98.3°F | Resp 18

## 2021-05-19 DIAGNOSIS — C189 Malignant neoplasm of colon, unspecified: Secondary | ICD-10-CM

## 2021-05-19 DIAGNOSIS — C184 Malignant neoplasm of transverse colon: Secondary | ICD-10-CM

## 2021-05-19 DIAGNOSIS — Z79899 Other long term (current) drug therapy: Secondary | ICD-10-CM | POA: Diagnosis not present

## 2021-05-19 DIAGNOSIS — C7800 Secondary malignant neoplasm of unspecified lung: Secondary | ICD-10-CM | POA: Diagnosis not present

## 2021-05-19 DIAGNOSIS — C778 Secondary and unspecified malignant neoplasm of lymph nodes of multiple regions: Secondary | ICD-10-CM | POA: Insufficient documentation

## 2021-05-19 DIAGNOSIS — Z95828 Presence of other vascular implants and grafts: Secondary | ICD-10-CM

## 2021-05-19 DIAGNOSIS — C787 Secondary malignant neoplasm of liver and intrahepatic bile duct: Secondary | ICD-10-CM | POA: Insufficient documentation

## 2021-05-19 DIAGNOSIS — Z5111 Encounter for antineoplastic chemotherapy: Secondary | ICD-10-CM | POA: Insufficient documentation

## 2021-05-19 DIAGNOSIS — E064 Drug-induced thyroiditis: Secondary | ICD-10-CM

## 2021-05-19 LAB — CBC WITH DIFFERENTIAL/PLATELET
Abs Immature Granulocytes: 0.02 10*3/uL (ref 0.00–0.07)
Basophils Absolute: 0.1 10*3/uL (ref 0.0–0.1)
Basophils Relative: 1 %
Eosinophils Absolute: 0.1 10*3/uL (ref 0.0–0.5)
Eosinophils Relative: 2 %
HCT: 41.8 % (ref 36.0–46.0)
Hemoglobin: 13.6 g/dL (ref 12.0–15.0)
Immature Granulocytes: 0 %
Lymphocytes Relative: 25 %
Lymphs Abs: 1.7 10*3/uL (ref 0.7–4.0)
MCH: 30.1 pg (ref 26.0–34.0)
MCHC: 32.5 g/dL (ref 30.0–36.0)
MCV: 92.5 fL (ref 80.0–100.0)
Monocytes Absolute: 0.6 10*3/uL (ref 0.1–1.0)
Monocytes Relative: 9 %
Neutro Abs: 4.3 10*3/uL (ref 1.7–7.7)
Neutrophils Relative %: 63 %
Platelets: 190 10*3/uL (ref 150–400)
RBC: 4.52 MIL/uL (ref 3.87–5.11)
RDW: 13.7 % (ref 11.5–15.5)
WBC: 6.9 10*3/uL (ref 4.0–10.5)
nRBC: 0 % (ref 0.0–0.2)

## 2021-05-19 LAB — COMPREHENSIVE METABOLIC PANEL
ALT: 16 U/L (ref 0–44)
AST: 23 U/L (ref 15–41)
Albumin: 4.4 g/dL (ref 3.5–5.0)
Alkaline Phosphatase: 70 U/L (ref 38–126)
Anion gap: 9 (ref 5–15)
BUN: 15 mg/dL (ref 8–23)
CO2: 27 mmol/L (ref 22–32)
Calcium: 9.6 mg/dL (ref 8.9–10.3)
Chloride: 100 mmol/L (ref 98–111)
Creatinine, Ser: 0.61 mg/dL (ref 0.44–1.00)
GFR, Estimated: >60 mL/min (ref >60)
Glucose, Bld: 98 mg/dL (ref 70–99)
Potassium: 3.7 mmol/L (ref 3.5–5.1)
Sodium: 136 mmol/L (ref 135–145)
Total Bilirubin: 0.5 mg/dL (ref 0.3–1.2)
Total Protein: 7.8 g/dL (ref 6.5–8.1)

## 2021-05-19 LAB — MAGNESIUM: Magnesium: 2.3 mg/dL (ref 1.7–2.4)

## 2021-05-19 LAB — TSH: TSH: 1.457 u[IU]/mL (ref 0.350–4.500)

## 2021-05-19 MED ORDER — HEPARIN SOD (PORK) LOCK FLUSH 100 UNIT/ML IV SOLN
500.0000 [IU] | Freq: Once | INTRAVENOUS | Status: AC | PRN
  Administered 2021-05-19: 500 [IU]

## 2021-05-19 MED ORDER — SODIUM CHLORIDE 0.9 % IV SOLN: Freq: Once | INTRAVENOUS | Status: AC

## 2021-05-19 MED ORDER — SODIUM CHLORIDE 0.9% FLUSH
10.0000 mL | INTRAVENOUS | Status: DC | PRN
Start: 2021-05-19 — End: 2021-05-19
  Administered 2021-05-19: 10 mL

## 2021-05-19 MED ORDER — SODIUM CHLORIDE 0.9 % IV SOLN
200.0000 mg | Freq: Once | INTRAVENOUS | Status: AC
  Administered 2021-05-19: 200 mg via INTRAVENOUS
  Filled 2021-05-19: qty 8

## 2021-05-19 NOTE — Progress Notes (Signed)
Pt presents today for Keytruda per provider's order. Vital signs and labs WNL for treatment and pt voiced no new complaints at this time.  Keytruda given today per MD orders. Tolerated infusion without adverse affects. Vital signs stable. No complaints at this time. Discharged from clinic ambulatory in stable condition. Alert and oriented x 3. F/U with [facility] as scheduled.

## 2021-05-19 NOTE — Patient Instructions (Signed)
[place] at [facility] Discharge Instructions  You were seen and examined today by Dr. Delton Coombes.  We will proceed with your Keytruda infusion today. Return as scheduled in 3 weeks for lab work and treatment. Return as scheduled in 6 weeks for lab work, office visit, and treatment.    Thank you for choosing [place] at [facility] to provide your oncology and hematology care.  To afford each patient quality time with our provider, please arrive at least 15 minutes before your scheduled appointment time.   If you have a lab appointment with the [facility] please come in thru the Main Entrance and check in at the main information desk.  You need to re-schedule your appointment should you arrive 10 or more minutes late.  We strive to give you quality time with our providers, and arriving late affects you and other patients whose appointments are after yours.  Also, if you no show three or more times for appointments you may be dismissed from the clinic at the providers discretion.     Again, thank you for choosing [facility].  Our hope is that these requests will decrease the amount of time that you wait before being seen by our physicians.       _____________________________________________________________  Should you have questions after your visit to [place], please contact our office at [phone] and follow the prompts.  Our office hours are 8:00 a.m. and 4:30 p.m. Monday - Friday.  Please note that voicemails left after 4:00 p.m. may not be returned until the following business day.  We are closed weekends and major holidays.  You do have access to a nurse 24-7, just call the main number to the clinic [phone] and do not press any options, hold on the line and a nurse will answer the phone.    For prescription refill requests, have your pharmacy contact our office and allow 72 hours.    Due to Covid,  you will need to wear a mask upon entering the hospital. If you do not have a mask, a mask will be given to you at the Main Entrance upon arrival. For doctor visits, patients may have 1 support person age 63 or older with them. For treatment visits, patients can not have anyone with them due to social distancing guidelines and our immunocompromised population.

## 2021-05-19 NOTE — Progress Notes (Signed)
Patient has been examined, vital signs and labs have been reviewed by Dr. Katragadda. ANC, Creatinine, LFTs, hemoglobin, and platelets are within treatment parameters per Dr. Katragadda. Patient may proceed with treatment per M.D.   

## 2021-05-21 LAB — CEA: CEA: 13.3 ng/mL — ABNORMAL HIGH (ref 0.0–4.7)

## 2021-06-09 ENCOUNTER — Inpatient Hospital Stay (HOSPITAL_COMMUNITY): Payer: Medicare Other | Attending: Hematology

## 2021-06-09 ENCOUNTER — Inpatient Hospital Stay (HOSPITAL_COMMUNITY): Payer: Medicare Other

## 2021-06-09 ENCOUNTER — Other Ambulatory Visit: Payer: Self-pay

## 2021-06-09 VITALS — BP 142/58 | HR 78 | Temp 98.2°F | Resp 18 | Wt 150.0 lb

## 2021-06-09 DIAGNOSIS — G629 Polyneuropathy, unspecified: Secondary | ICD-10-CM | POA: Insufficient documentation

## 2021-06-09 DIAGNOSIS — Z7982 Long term (current) use of aspirin: Secondary | ICD-10-CM | POA: Insufficient documentation

## 2021-06-09 DIAGNOSIS — Z87891 Personal history of nicotine dependence: Secondary | ICD-10-CM | POA: Insufficient documentation

## 2021-06-09 DIAGNOSIS — Z5112 Encounter for antineoplastic immunotherapy: Secondary | ICD-10-CM | POA: Insufficient documentation

## 2021-06-09 DIAGNOSIS — C787 Secondary malignant neoplasm of liver and intrahepatic bile duct: Secondary | ICD-10-CM | POA: Diagnosis not present

## 2021-06-09 DIAGNOSIS — Z79899 Other long term (current) drug therapy: Secondary | ICD-10-CM | POA: Insufficient documentation

## 2021-06-09 DIAGNOSIS — R197 Diarrhea, unspecified: Secondary | ICD-10-CM | POA: Diagnosis not present

## 2021-06-09 DIAGNOSIS — C184 Malignant neoplasm of transverse colon: Secondary | ICD-10-CM | POA: Diagnosis not present

## 2021-06-09 DIAGNOSIS — Z95828 Presence of other vascular implants and grafts: Secondary | ICD-10-CM

## 2021-06-09 DIAGNOSIS — E064 Drug-induced thyroiditis: Secondary | ICD-10-CM

## 2021-06-09 DIAGNOSIS — C770 Secondary and unspecified malignant neoplasm of lymph nodes of head, face and neck: Secondary | ICD-10-CM | POA: Diagnosis not present

## 2021-06-09 DIAGNOSIS — C7800 Secondary malignant neoplasm of unspecified lung: Secondary | ICD-10-CM | POA: Diagnosis not present

## 2021-06-09 LAB — COMPREHENSIVE METABOLIC PANEL
ALT: 27 U/L (ref 0–44)
AST: 27 U/L (ref 15–41)
Albumin: 4 g/dL (ref 3.5–5.0)
Alkaline Phosphatase: 73 U/L (ref 38–126)
Anion gap: 8 (ref 5–15)
BUN: 11 mg/dL (ref 8–23)
CO2: 27 mmol/L (ref 22–32)
Calcium: 9.2 mg/dL (ref 8.9–10.3)
Chloride: 102 mmol/L (ref 98–111)
Creatinine, Ser: 0.62 mg/dL (ref 0.44–1.00)
GFR, Estimated: >60 mL/min (ref >60)
Glucose, Bld: 98 mg/dL (ref 70–99)
Potassium: 3.9 mmol/L (ref 3.5–5.1)
Sodium: 137 mmol/L (ref 135–145)
Total Bilirubin: 0.4 mg/dL (ref 0.3–1.2)
Total Protein: 7.7 g/dL (ref 6.5–8.1)

## 2021-06-09 LAB — CBC WITH DIFFERENTIAL/PLATELET
Abs Immature Granulocytes: 0.02 10*3/uL (ref 0.00–0.07)
Basophils Absolute: 0 10*3/uL (ref 0.0–0.1)
Basophils Relative: 1 %
Eosinophils Absolute: 0.1 10*3/uL (ref 0.0–0.5)
Eosinophils Relative: 2 %
HCT: 40.3 % (ref 36.0–46.0)
Hemoglobin: 13 g/dL (ref 12.0–15.0)
Immature Granulocytes: 0 %
Lymphocytes Relative: 26 %
Lymphs Abs: 1.5 10*3/uL (ref 0.7–4.0)
MCH: 30 pg (ref 26.0–34.0)
MCHC: 32.3 g/dL (ref 30.0–36.0)
MCV: 92.9 fL (ref 80.0–100.0)
Monocytes Absolute: 0.6 10*3/uL (ref 0.1–1.0)
Monocytes Relative: 10 %
Neutro Abs: 3.5 10*3/uL (ref 1.7–7.7)
Neutrophils Relative %: 61 %
Platelets: 181 10*3/uL (ref 150–400)
RBC: 4.34 MIL/uL (ref 3.87–5.11)
RDW: 13.3 % (ref 11.5–15.5)
WBC: 5.8 10*3/uL (ref 4.0–10.5)
nRBC: 0 % (ref 0.0–0.2)

## 2021-06-09 LAB — MAGNESIUM: Magnesium: 2.1 mg/dL (ref 1.7–2.4)

## 2021-06-09 LAB — TSH: TSH: 1.776 u[IU]/mL (ref 0.350–4.500)

## 2021-06-09 MED ORDER — SODIUM CHLORIDE 0.9% FLUSH
10.0000 mL | INTRAVENOUS | Status: DC | PRN
  Administered 2021-06-09: 10 mL

## 2021-06-09 MED ORDER — SODIUM CHLORIDE 0.9 % IV SOLN
200.0000 mg | Freq: Once | INTRAVENOUS | Status: AC
  Administered 2021-06-09: 200 mg via INTRAVENOUS
  Filled 2021-06-09: qty 8

## 2021-06-09 MED ORDER — SODIUM CHLORIDE 0.9 % IV SOLN: Freq: Once | INTRAVENOUS | Status: AC

## 2021-06-09 MED ORDER — HEPARIN SOD (PORK) LOCK FLUSH 100 UNIT/ML IV SOLN
500.0000 [IU] | Freq: Once | INTRAVENOUS | Status: AC | PRN
  Administered 2021-06-09: 500 [IU]

## 2021-06-09 NOTE — Patient Instructions (Signed)
[facility]  Discharge Instructions: Thank you for choosing [facility] to provide your oncology and hematology care.  If you have a lab appointment with the Cancer Center, please come in thru the Main Entrance and check in at the main information desk.  Wear comfortable clothing and clothing appropriate for easy access to any Portacath or PICC line.   We strive to give you quality time with your provider. You may need to reschedule your appointment if you arrive late (15 or more minutes).  Arriving late affects you and other patients whose appointments are after yours.  Also, if you miss three or more appointments without notifying the office, you may be dismissed from the clinic at the provider's discretion.      For prescription refill requests, have your pharmacy contact our office and allow 72 hours for refills to be completed.    Today you received the following chemotherapy and/or immunotherapy agents Keytruda       To help prevent nausea and vomiting after your treatment, we encourage you to take your nausea medication as directed.  BELOW ARE SYMPTOMS THAT SHOULD BE REPORTED IMMEDIATELY: *FEVER GREATER THAN 100.4 F (38 C) OR HIGHER *CHILLS OR SWEATING *NAUSEA AND VOMITING THAT IS NOT CONTROLLED WITH YOUR NAUSEA MEDICATION *UNUSUAL SHORTNESS OF BREATH *UNUSUAL BRUISING OR BLEEDING *URINARY PROBLEMS (pain or burning when urinating, or frequent urination) *BOWEL PROBLEMS (unusual diarrhea, constipation, pain near the anus) TENDERNESS IN MOUTH AND THROAT WITH OR WITHOUT PRESENCE OF ULCERS (sore throat, sores in mouth, or a toothache) UNUSUAL RASH, SWELLING OR PAIN  UNUSUAL VAGINAL DISCHARGE OR ITCHING   Items with * indicate a potential emergency and should be followed up as soon as possible or go to the Emergency Department if any problems should occur.  Please show the CHEMOTHERAPY ALERT CARD or IMMUNOTHERAPY ALERT CARD at check-in to the Emergency  Department and triage nurse.  Should you have questions after your visit or need to cancel or reschedule your appointment, please contact [facility] [phone]  and follow the prompts.  Office hours are 8:00 a.m. to 4:30 p.m. Monday - Friday. Please note that voicemails left after 4:00 p.m. may not be returned until the following business day.  We are closed weekends and major holidays. You have access to a nurse at all times for urgent questions. Please call the main number to the clinic [phone] and follow the prompts.  For any non-urgent questions, you may also contact your provider using MyChart. We now offer e-Visits for anyone 18 and older to request care online for non-urgent symptoms. For details visit [email or web address].   Also download the MyChart app! Go to the app store, search "MyChart", open the app, select [place], and log in with your MyChart username and password.  Due to Covid, a mask is required upon entering the hospital/clinic. If you do not have a mask, one will be given to you upon arrival. For doctor visits, patients may have 1 support person aged 18 or older with them. For treatment visits, patients cannot have anyone with them due to current Covid guidelines and our immunocompromised population.  

## 2021-06-09 NOTE — Progress Notes (Signed)
Patient presents today for Keytruda infusion per providers order.  Vital signs and labs within parameters.  Patient has no new complaints at this time.  Keytruda given today per MD orders.  Stable during infusion without adverse affects.  Vital signs stable.  No complaints at this time.  Discharge from clinic ambulatory in stable condition.  Alert and oriented X 3.  Patient declined AVS.  Follow up with [facility] as scheduled.

## 2021-06-25 ENCOUNTER — Other Ambulatory Visit: Payer: Self-pay

## 2021-06-29 NOTE — Progress Notes (Signed)
[place], [place] [address]   CLINIC:  Medical Oncology/Hematology  PCP:  Amanda Norris, [place] / [place] [address] [phone]   REASON FOR VISIT:  Follow-up for transverse colon adenocarcinoma  PRIOR THERAPY:  1. Right hemicolectomy on 12/29/2013. 2. FOLFOX x 3 cycles from 02/10/2014 to 03/10/2014, stopped secondary to poor tolerance  NGS Results: MSI high, MMR deficient, BRAF V600E, TMB high  CURRENT THERAPY: Keytruda every 3 weeks  BRIEF ONCOLOGIC HISTORY:  Oncology History  Adenocarcinoma of transverse colon ([place]) (Resolved)  12/09/2013 Initial Diagnosis   1. Colon, biopsy, proximal transverse mass INVASIVE ADENOCARCINOMA.   12/29/2013 Definitive Surgery   Colon, segmental resection for tumor, right - INVASIVE COLORECTAL ADENOCARCINOMA, 7 CM, EXTENDING INTO PERICOLONIC CONNECTIVE TISSUE. - METASTATIC CARCINOMA IN 6 OF 33 LYMPH NODES (6/33).   02/10/2014 - 03/10/2014 Adjuvant Chemotherapy   FOLFOX x 3 cycles   03/11/2014 Adverse Reaction   Patient called reporting diarrhea despite dose reduction and she wants to stop therapy.  Patient seen on 03/24/14 to discuss other treatment options and she stands by her decision to stop all therapy.   04/01/2014 Surgery   Port-a-cath removal by Dr. Arnoldo Norris.   01/12/2015 PET scan   Mild abdominal mesenteric LAD show hypermetabolic activity, 7 mm indeterminate pulm nodule in sup segment of RLL shows no assoc metabolic activity   6/56/8127 PET scan   Mild progression of nodal mets in anterior mid abdominal mesentery, new nodal mets at L thoracic inlet. stable 6 mm nodule in posterior RLL, unchanged since 2015   06/22/2015 PET scan   Resolution of metabolic activity and decreased size of mild LAD at L thoracic inlet, stable mild mid abd hypermetabolic mesenteric LAD, stable 6 mm posterior LLL pulm nodule without metabolic activity   11/24/15 Imaging   MRI lumbar spine L4-L5 disc  degenerated, broad based disc hernation, B facet arthropathy with hypertophy and edema, stenosis of lateral recesses that could cause neural compression   10/18/2016 Imaging   CT CAP- 1. Several small ground-glass pulmonary nodules are present in the lungs and are stable over the past 9 months, but several are mildly larger than they were in 2015. Low-grade adenocarcinoma can sometimes have this appearance and surveillance is likely warranted. 2. There is a cluster of nodal tissue in the central mesentery, maximum short axis diameter 1.3 cm. This nodal cluster is relatively low in density and not appreciably changed from the prior exam. Given the lack of change is may represent quiescent residua of malignancy, but again, surveillance is likely warranted. 3. The left-sided rib fractures are more numerous than was revealed on the prior radiographs ; there are nondisplaced healing fractures of the left fifth, sixth, seventh, eighth, ninth, and tenth ribs. 4. Mild cardiomegaly although part of this appearance may be due to pectus excavatum. 5. Several additional small pulmonary nodules are stable from 2015 and considered benign. 6. Right hemicolectomy and sigmoid colon diverticulosis. 7. Small right paraumbilical hernia containing adipose tissue. 8. Lower lumbar spondylosis and degenerative disc disease potentially with mild impingement at L4-5.   04/25/2017 Imaging   CT C/A/P: Scattered solid pulmonary nodules measure up to 6 mm in the right lower lobe, unchanged. There are a few scattered ground-glass nodules measuring up to 8 mm in the right upper lobe, also stable. Distal perigastric lymph nodes are stable. No additional evidence of metastatic disease.   Metastatic colon cancer to liver ([place])  11/04/2020 Initial Diagnosis  Metastatic colon cancer to liver [facility])   11/10/2020 - 12/10/2020 Chemotherapy          01/13/2021 -  Chemotherapy   Patient is on Treatment Plan : COLORECTAL  Pembrolizumab q21d       CANCER STAGING:  Cancer Staging  No matching staging information was found for the patient.  INTERVAL HISTORY:  [name], a 75 y.o. female, returns for routine follow-up and consideration for next cycle of chemotherapy. Thi was last seen on 05/19/2021.  Due for cycle #9 of Keytruda today.   Overall, she tells me she has been feeling pretty well. She denies abdominal pain, dry cough, SOB, burning sensation in her feet, ankle swellings, and skin rash. Her diarrhea is stable for which she take 1/2 tablet of imodium, and she reports itching on her back.    Overall, she feels ready for next cycle of chemo today.   REVIEW OF SYSTEMS:  Review of Systems  Constitutional:  Negative for appetite change and fatigue (75%).  Respiratory:  Negative for cough and shortness of breath.   Cardiovascular:  Negative for leg swelling.  Gastrointestinal:  Positive for diarrhea. Negative for abdominal pain.  Skin:  Positive for itching (back). Negative for rash.  All other systems reviewed and are negative.  PAST MEDICAL/SURGICAL HISTORY:  Past Medical History:  Diagnosis Date   Arthritis    wrist and thumbs   Colon cancer (HCC)    PONV (postoperative nausea and vomiting)    Port-A-Cath in place 11/09/2020   Ruptured lumbar disc    L1-L5 per patient report   Sciatica of left side    Past Surgical History:  Procedure Laterality Date   COLONOSCOPY N/A 12/12/2013   Procedure: COLONOSCOPY;  Surgeon: Amanda Houston, MD;  [facility];  Service: Endoscopy;  Laterality: N/A;  730   COLONOSCOPY N/A 01/20/2015   Procedure: COLONOSCOPY;  Surgeon: Amanda Houston, MD;  [facility];  Service: Endoscopy;  Laterality: N/A;  1030   COLONOSCOPY N/A 03/28/2018   Procedure: COLONOSCOPY;  Surgeon: Amanda Houston, MD;  [facility]: [facility];  Service: Endoscopy;  Laterality: N/A;  1015   EUS N/A 12/18/2013   Procedure: UPPER ENDOSCOPIC ULTRASOUND (EUS)  LINEAR;  Surgeon: Amanda Banister, MD;  Location: [facility];  Service: Endoscopy;  Laterality: N/A;   herniated disc     1996-[id number]   IR IMAGING GUIDED PORT INSERTION  10/14/2020   IR US GUIDANCE  10/14/2020   PARTIAL COLECTOMY N/A 12/29/2013   Procedure: RIGHT HEMICOLECTOMY;  Surgeon: Jamesetta So, MD;  Location: [facility];  Service: General;  Laterality: N/A;   POLYPECTOMY  03/28/2018   Procedure: POLYPECTOMY;  Surgeon: Amanda Houston, MD;  [facility];  Service: Endoscopy;;  transverse colon (hot snare);   PORT-A-CATH REMOVAL Right 04/01/2014   Procedure: MINOR REMOVAL PORT-A-CATH;  Surgeon: Jamesetta So, MD;  Location: [facility];  Service: General;  Laterality: Right;   PORTACATH PLACEMENT Right 02/04/2014   Procedure: INSERTION PORT-A-CATH;  Surgeon: Jamesetta So, MD;  Location: [facility];  Service: General;  Laterality: Right;    SOCIAL HISTORY:  Social History   Socioeconomic History   Marital status: Married    Spouse name: Not on file   Number of children: Not on file   Years of education: Not on file   Highest education level: Not on file  Occupational History   Not on file  Tobacco Use  Smoking status: Former    Packs/day: 0.25    Years: 5.00    Pack years: 1.25    Types: Cigarettes    Quit date: 04/19/1959    Years since quitting: 62.2   Smokeless tobacco: Never   Tobacco comments:    smoked for 5 years as teenager  Vaping Use   Vaping Use: Never used  Substance and Sexual Activity   Alcohol use: No   Drug use: No   Sexual activity: Yes    Birth control/protection: Post-menopausal  Other Topics Concern   Not on file  Social History Narrative   Not on file   Social Determinants of Health   Financial Resource Strain: Low Risk    Difficulty of Paying Living Expenses: Not hard at all  Food Insecurity: No Food Insecurity   Worried About Charity fundraiser in the Last Year: Never true   Ran Out of Food in the Last Year: Never true  Transportation  Needs: No Transportation Needs   Lack of Transportation (Medical): No   Lack of Transportation (Non-Medical): No  Physical Activity: Inactive   Days of Exercise per Week: 0 days   Minutes of Exercise per Session: 0 min  Stress: Stress Concern Present   Feeling of Stress : To some extent  Social Connections: Engineer, building services of Communication with Friends and Family: More than three times a week   Frequency of Social Gatherings with Friends and Family: More than three times a week   Attends Religious Services: More than 4 times per year   Active Member of [employer] or Organizations: Yes   Attends Music therapist: More than 4 times per year   Marital Status: Married  Human resources officer Violence: Not At Risk   Fear of Current or Ex-Partner: No   Emotionally Abused: No   Physically Abused: No   Sexually Abused: No    FAMILY HISTORY:  Family History  Problem Relation Age of Onset   Diabetes type II Mother    Dementia Father     CURRENT MEDICATIONS:  Current Outpatient Medications  Medication Sig Dispense Refill   acetaminophen (TYLENOL) 500 MG tablet Take 500 mg by mouth every 6 (six) hours as needed for moderate pain or headache.     aspirin 81 MG EC tablet Take 1 tablet (81 mg total) by mouth daily. Swallow whole. 30 tablet 12   Bioflavonoid Products (ESTER C PO) Take 1,000 mg by mouth daily.     Cholecalciferol (D3-1000) 25 MCG (1000 UT) capsule Take 125 Units by mouth daily.     Cranberry-Vitamin C-Vitamin E 4200-20-3 MG-MG-UNIT CAPS Take 500 mg by mouth daily.     Garlic 3212 MG CAPS Take 1,000 mg by mouth daily.      Multiple Vitamin (MULTIVITAMIN) tablet Take 1 tablet by mouth daily.     Omega-3 Fatty Acids (FISH OIL) 1200 MG CAPS Take 1,000 mg by mouth daily.     OVER THE COUNTER MEDICATION Take 1,000 mg by mouth daily. [place] 1000 mg daily     OVER THE COUNTER MEDICATION Take 1,000 mg by mouth daily. L-Lysine 1084m po daily      pramoxine-hydrocortisone (PROCTOCREAM-HC) 1-1 % rectal cream Place 1 application rectally 2 (two) times daily. 30 g 0   zinc gluconate 50 MG tablet Take 50 mg by mouth daily.     No current facility-administered medications for this visit.    ALLERGIES:  Allergies  Allergen Reactions   Sulfa Antibiotics  Other (See Comments)    "Sores in my mouth"    PHYSICAL EXAM:  Performance status (ECOG): 1 - Symptomatic but completely ambulatory  Vitals:   06/30/21 1305  BP: 138/74  Pulse: 99  Resp: 19  Temp: 98.7 F (37.1 C)  SpO2: 99%   Wt Readings from Last 3 Encounters:  06/30/21 152 lb 4.8 oz (69.1 kg)  06/09/21 150 lb (68 kg)  05/19/21 150 lb 6.4 oz (68.2 kg)   Physical Exam Vitals reviewed.  Constitutional:      Appearance: Normal appearance.  Cardiovascular:     Rate and Rhythm: Normal rate and regular rhythm.     Pulses: Normal pulses.     Heart sounds: Normal heart sounds.  Pulmonary:     Effort: Pulmonary effort is normal.     Breath sounds: Normal breath sounds.  Skin:    Findings: No rash.  Neurological:     General: No focal deficit present.     Mental Status: She is alert and oriented to person, place, and time.  Psychiatric:        Mood and Affect: Mood normal.        Behavior: Behavior normal.    LABORATORY DATA:  I have reviewed the labs as listed.  CBC Latest Ref Rng & Units 06/30/2021 06/09/2021 05/19/2021  WBC 4.0 - 10.5 K/uL 6.4 5.8 6.9  Hemoglobin 12.0 - 15.0 g/dL 13.6 13.0 13.6  Hematocrit 36.0 - 46.0 % 42.2 40.3 41.8  Platelets 150 - 400 K/uL 194 181 190   CMP Latest Ref Rng & Units 06/30/2021 06/09/2021 05/19/2021  Glucose 70 - 99 mg/dL 99 98 98  BUN 8 - 23 mg/dL _0 Creatinine 0.44 - 1.00 mg/dL 0.64 0.62 0.61  Sodium 135 - 145 mmol/L 136 137 136  Potassium 3.5 - 5.1 mmol/L 3.7 3.9 3.7  Chloride 98 - 111 mmol/L 101 102 100  CO2 22 - 32 mmol/L _1 Calcium 8.9 - 10.3 mg/dL 9.2 9.2 9.6  Total Protein 6.5 - 8.1 g/dL 8.2(H) 7.7 7.8   Total Bilirubin 0.3 - 1.2 mg/dL 0.3 0.4 0.5  Alkaline Phos 38 - 126 U/L 67 73 70  AST 15 - 41 U/L _2 ALT 0 - 44 U/L _3 DIAGNOSTIC IMAGING:  I have independently reviewed the scans and discussed with the patient. No results found.   ASSESSMENT:  1.  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 - Status post 3 cycles of FOLFOX from 02/10/2014 through 03/10/2014, discontinued secondary to poor tolerance.  DPD was negative. -Last colonoscopy on 03/28/2018 patent ileo-colonic anastomosis.  8 mm polyp in the transverse colon.  Diverticulosis in the sigmoid colon, descending colon. - CT scan on 11/04/2018 shows postoperative findings of right colectomy with redemonstrated mesenteric nodule/lymph node measuring 1.9 x 1.7 cm, unchanged from prior scan in October 2019.  No evidence of metastatic disease.  Stable solid and groundglass pulmonary nodules, stable over multiple prior exams. -CEA was 4.6 on 10/28/2018. -CTAP on 09/21/2020 showed numerous small pulmonary nodules throughout the lung bases, 5 mm.  Enlarged retrocrural periaortic lymph node in the lower chest.  Soft tissue nodule within the central mesentery substantially increased in size, encasing proximal superior mesenteric artery and measuring 5 x 4.3 cm.  Numerous retroperitoneal lymph nodes, largest aortocaval lymph node measuring 2.8 x  2.2 cm. -CT chest on 09/27/2020 shows a left thoracic inlet lymph node approximately 2.3 cm.  Mediastinal lymph nodes and multiple subcentimeter pulmonary nodules suggestive of metastatic disease. -Mesenteric lymph node biopsy on 09/28/2020 shows mucin and fibrosis. -Left supraclavicular lymph node biopsy on 10/14/2020-soft tissue with abundant mucin, rare fragments of atypical columnar epithelium. - 3 cycles of FOLFIRI and bevacizumab dose reduced from 11/10/2020  through 12/08/2020. - [facility] test-BRAF V600 E+, MMR deficient, MSI high, TMB high, HER2 negative, BRCA1/2 pathogenic variant on exon 14 and exon 9 respectively.  The report also suggested decreased benefit to FOLFOX and bevacizumab in the first-line metastatic setting. - Cycle 1 of Keytruda on 01/13/2021.   2.  Health maintenance: -Mammogram dated 05/16/2018 was BI-RADS Category 1.   PLAN:  1.  Metastatic colon cancer to the liver, lungs and lymph nodes: - CT CAP on 03/31/2021 after Keytruda showed slight improvement in the lymph nodes in the chest, abdomen and pelvis.  No new lesions.  Liver lesion and lung lesion are not substantially changed. - She is tolerating Keytruda very well.  Last CEA level was 13.3 and is continuing to improve. - Labs from today shows normal LFTs and CBC.  TSH was 2.7. - We will proceed with treatment today and in 3 weeks.  RTC 6 weeks with repeat CT CAP and CEA levels.   2.  Diarrhea: - Baseline diarrhea about 3-4 watery bowel movements per day is stable.  This is stable since surgery.  She will continue Imodium half tablet daily as needed.   3.  Peripheral neuropathy: - Peripheral to neuropathy with burning sensation in the feet has improved.   4.  Nutrition: - She is eating well and her weight is stable.  She is not requiring nutritional supplements.   Orders placed this encounter:  No orders of the defined types were placed in this encounter.    Derek Jack, MD [place] [phone]   I, Thana Ates, am acting as a scribe for Dr. Derek Jack.  I, Derek Jack MD, have reviewed the above documentation for accuracy and completeness, and I agree with the above.

## 2021-06-30 ENCOUNTER — Inpatient Hospital Stay (HOSPITAL_BASED_OUTPATIENT_CLINIC_OR_DEPARTMENT_OTHER): Payer: Medicare Other | Admitting: Hematology

## 2021-06-30 ENCOUNTER — Encounter (HOSPITAL_COMMUNITY): Payer: Self-pay | Admitting: Hematology

## 2021-06-30 ENCOUNTER — Inpatient Hospital Stay (HOSPITAL_COMMUNITY): Payer: Medicare Other

## 2021-06-30 ENCOUNTER — Other Ambulatory Visit: Payer: Self-pay

## 2021-06-30 VITALS — BP 138/74 | HR 99 | Temp 98.7°F | Resp 19 | Ht 63.0 in | Wt 152.3 lb

## 2021-06-30 VITALS — BP 124/71 | HR 71 | Temp 98.6°F | Resp 18

## 2021-06-30 DIAGNOSIS — C7800 Secondary malignant neoplasm of unspecified lung: Secondary | ICD-10-CM | POA: Diagnosis not present

## 2021-06-30 DIAGNOSIS — C787 Secondary malignant neoplasm of liver and intrahepatic bile duct: Secondary | ICD-10-CM | POA: Diagnosis not present

## 2021-06-30 DIAGNOSIS — E064 Drug-induced thyroiditis: Secondary | ICD-10-CM

## 2021-06-30 DIAGNOSIS — Z5112 Encounter for antineoplastic immunotherapy: Secondary | ICD-10-CM | POA: Diagnosis not present

## 2021-06-30 DIAGNOSIS — C184 Malignant neoplasm of transverse colon: Secondary | ICD-10-CM

## 2021-06-30 DIAGNOSIS — C770 Secondary and unspecified malignant neoplasm of lymph nodes of head, face and neck: Secondary | ICD-10-CM | POA: Diagnosis not present

## 2021-06-30 DIAGNOSIS — R197 Diarrhea, unspecified: Secondary | ICD-10-CM | POA: Diagnosis not present

## 2021-06-30 DIAGNOSIS — Z95828 Presence of other vascular implants and grafts: Secondary | ICD-10-CM

## 2021-06-30 LAB — COMPREHENSIVE METABOLIC PANEL
ALT: 16 U/L (ref 0–44)
AST: 24 U/L (ref 15–41)
Albumin: 4.5 g/dL (ref 3.5–5.0)
Alkaline Phosphatase: 67 U/L (ref 38–126)
Anion gap: 10 (ref 5–15)
BUN: 15 mg/dL (ref 8–23)
CO2: 25 mmol/L (ref 22–32)
Calcium: 9.2 mg/dL (ref 8.9–10.3)
Chloride: 101 mmol/L (ref 98–111)
Creatinine, Ser: 0.64 mg/dL (ref 0.44–1.00)
GFR, Estimated: >60 mL/min (ref >60)
Glucose, Bld: 99 mg/dL (ref 70–99)
Potassium: 3.7 mmol/L (ref 3.5–5.1)
Sodium: 136 mmol/L (ref 135–145)
Total Bilirubin: 0.3 mg/dL (ref 0.3–1.2)
Total Protein: 8.2 g/dL — ABNORMAL HIGH (ref 6.5–8.1)

## 2021-06-30 LAB — CBC WITH DIFFERENTIAL/PLATELET
Abs Immature Granulocytes: 0.03 10*3/uL (ref 0.00–0.07)
Basophils Absolute: 0 10*3/uL (ref 0.0–0.1)
Basophils Relative: 1 %
Eosinophils Absolute: 0.1 10*3/uL (ref 0.0–0.5)
Eosinophils Relative: 2 %
HCT: 42.2 % (ref 36.0–46.0)
Hemoglobin: 13.6 g/dL (ref 12.0–15.0)
Immature Granulocytes: 1 %
Lymphocytes Relative: 25 %
Lymphs Abs: 1.6 10*3/uL (ref 0.7–4.0)
MCH: 30.5 pg (ref 26.0–34.0)
MCHC: 32.2 g/dL (ref 30.0–36.0)
MCV: 94.6 fL (ref 80.0–100.0)
Monocytes Absolute: 0.6 10*3/uL (ref 0.1–1.0)
Monocytes Relative: 9 %
Neutro Abs: 4.1 10*3/uL (ref 1.7–7.7)
Neutrophils Relative %: 62 %
Platelets: 194 10*3/uL (ref 150–400)
RBC: 4.46 MIL/uL (ref 3.87–5.11)
RDW: 13.3 % (ref 11.5–15.5)
WBC: 6.4 10*3/uL (ref 4.0–10.5)
nRBC: 0 % (ref 0.0–0.2)

## 2021-06-30 LAB — MAGNESIUM: Magnesium: 2.2 mg/dL (ref 1.7–2.4)

## 2021-06-30 LAB — TSH: TSH: 2.747 u[IU]/mL (ref 0.350–4.500)

## 2021-06-30 MED ORDER — HEPARIN SOD (PORK) LOCK FLUSH 100 UNIT/ML IV SOLN
500.0000 [IU] | Freq: Once | INTRAVENOUS | Status: AC | PRN
  Administered 2021-06-30: 15:00:00 500 [IU]

## 2021-06-30 MED ORDER — SODIUM CHLORIDE 0.9 % IV SOLN
200.0000 mg | Freq: Once | INTRAVENOUS | Status: AC
  Administered 2021-06-30: 14:00:00 200 mg via INTRAVENOUS
  Filled 2021-06-30: qty 8

## 2021-06-30 MED ORDER — SODIUM CHLORIDE 0.9% FLUSH
10.0000 mL | INTRAVENOUS | Status: DC | PRN
  Administered 2021-06-30: 15:00:00 10 mL

## 2021-06-30 MED ORDER — SODIUM CHLORIDE 0.9 % IV SOLN: Freq: Once | INTRAVENOUS | Status: AC

## 2021-06-30 NOTE — Patient Instructions (Signed)
[place] at [facility] Discharge Instructions   You were seen and examined today by Dr. Delton Coombes. He reviewed your lab work which was normal/stable. You will receive your treatment today and every 3 weeks. We will see you back in 6 weeks with a CT scan prior.    Thank you for choosing [place] at [facility] to provide your oncology and hematology care.  To afford each patient quality time with our provider, please arrive at least 15 minutes before your scheduled appointment time.   If you have a lab appointment with the [facility] please come in thru the Main Entrance and check in at the main information desk.  You need to re-schedule your appointment should you arrive 10 or more minutes late.  We strive to give you quality time with our providers, and arriving late affects you and other patients whose appointments are after yours.  Also, if you no show three or more times for appointments you may be dismissed from the clinic at the providers discretion.     Again, thank you for choosing [facility].  Our hope is that these requests will decrease the amount of time that you wait before being seen by our physicians.       _____________________________________________________________  Should you have questions after your visit to [facility], please contact our office at [phone] and follow the prompts.  Our office hours are 8:00 a.m. and 4:30 p.m. Monday - Friday.  Please note that voicemails left after 4:00 p.m. may not be returned until the following business day.  We are closed weekends and major holidays.  You do have access to a nurse 24-7, just call the main number to the clinic [phone] and do not press any options, hold on the line and a nurse will answer the phone.    For prescription refill requests, have your pharmacy contact our office and allow 72 hours.    Due to Covid, you will need to  wear a mask upon entering the hospital. If you do not have a mask, a mask will be given to you at the Main Entrance upon arrival. For doctor visits, patients may have 1 support person age 39 or older with them. For treatment visits, patients can not have anyone with them due to social distancing guidelines and our immunocompromised population.

## 2021-06-30 NOTE — Progress Notes (Signed)
Treatment given per orders. Patient tolerated it well without problems. Vitals stable and discharged home from clinic ambulatory. Follow up as scheduled.  

## 2021-06-30 NOTE — Patient Instructions (Signed)
[facility]  Discharge Instructions: Thank you for choosing [facility] to provide your oncology and hematology care.  If you have a lab appointment with the Cancer Center, please come in thru the Main Entrance and check in at the main information desk.  Wear comfortable clothing and clothing appropriate for easy access to any Portacath or PICC line.   We strive to give you quality time with your provider. You may need to reschedule your appointment if you arrive late (15 or more minutes).  Arriving late affects you and other patients whose appointments are after yours.  Also, if you miss three or more appointments without notifying the office, you may be dismissed from the clinic at the provider's discretion.      For prescription refill requests, have your pharmacy contact our office and allow 72 hours for refills to be completed.        To help prevent nausea and vomiting after your treatment, we encourage you to take your nausea medication as directed.  BELOW ARE SYMPTOMS THAT SHOULD BE REPORTED IMMEDIATELY: *FEVER GREATER THAN 100.4 F (38 C) OR HIGHER *CHILLS OR SWEATING *NAUSEA AND VOMITING THAT IS NOT CONTROLLED WITH YOUR NAUSEA MEDICATION *UNUSUAL SHORTNESS OF BREATH *UNUSUAL BRUISING OR BLEEDING *URINARY PROBLEMS (pain or burning when urinating, or frequent urination) *BOWEL PROBLEMS (unusual diarrhea, constipation, pain near the anus) TENDERNESS IN MOUTH AND THROAT WITH OR WITHOUT PRESENCE OF ULCERS (sore throat, sores in mouth, or a toothache) UNUSUAL RASH, SWELLING OR PAIN  UNUSUAL VAGINAL DISCHARGE OR ITCHING   Items with * indicate a potential emergency and should be followed up as soon as possible or go to the Emergency Department if any problems should occur.  Please show the CHEMOTHERAPY ALERT CARD or IMMUNOTHERAPY ALERT CARD at check-in to the Emergency Department and triage nurse.  Should you have questions after your visit or need to cancel  or reschedule your appointment, please contact [facility] [phone]  and follow the prompts.  Office hours are 8:00 a.m. to 4:30 p.m. Monday - Friday. Please note that voicemails left after 4:00 p.m. may not be returned until the following business day.  We are closed weekends and major holidays. You have access to a nurse at all times for urgent questions. Please call the main number to the clinic [phone] and follow the prompts.  For any non-urgent questions, you may also contact your provider using MyChart. We now offer e-Visits for anyone 18 and older to request care online for non-urgent symptoms. For details visit [email or web address].   Also download the MyChart app! Go to the app store, search "MyChart", open the app, select [place], and log in with your MyChart username and password.  Due to Covid, a mask is required upon entering the hospital/clinic. If you do not have a mask, one will be given to you upon arrival. For doctor visits, patients may have 1 support person aged 18 or older with them. For treatment visits, patients cannot have anyone with them due to current Covid guidelines and our immunocompromised population.  

## 2021-06-30 NOTE — Progress Notes (Signed)
Patient has been examined by Dr. Katragadda, and vital signs and labs have been reviewed. ANC, Creatinine, LFTs, hemoglobin, and platelets are within treatment parameters per M.D. - pt may proceed with treatment.    °

## 2021-07-01 LAB — CEA: CEA: 7.1 ng/mL — ABNORMAL HIGH (ref 0.0–4.7)

## 2021-07-21 ENCOUNTER — Inpatient Hospital Stay (HOSPITAL_COMMUNITY): Payer: Medicare Other | Attending: Hematology

## 2021-07-21 ENCOUNTER — Inpatient Hospital Stay (HOSPITAL_COMMUNITY): Payer: Medicare Other

## 2021-07-21 ENCOUNTER — Encounter (HOSPITAL_COMMUNITY): Payer: Self-pay

## 2021-07-21 ENCOUNTER — Other Ambulatory Visit: Payer: Self-pay

## 2021-07-21 VITALS — BP 140/90 | HR 61 | Temp 97.8°F | Resp 18 | Ht 64.17 in | Wt 151.9 lb

## 2021-07-21 DIAGNOSIS — C7800 Secondary malignant neoplasm of unspecified lung: Secondary | ICD-10-CM | POA: Insufficient documentation

## 2021-07-21 DIAGNOSIS — C787 Secondary malignant neoplasm of liver and intrahepatic bile duct: Secondary | ICD-10-CM

## 2021-07-21 DIAGNOSIS — Z5112 Encounter for antineoplastic immunotherapy: Secondary | ICD-10-CM | POA: Insufficient documentation

## 2021-07-21 DIAGNOSIS — Z79899 Other long term (current) drug therapy: Secondary | ICD-10-CM | POA: Diagnosis not present

## 2021-07-21 DIAGNOSIS — C189 Malignant neoplasm of colon, unspecified: Secondary | ICD-10-CM

## 2021-07-21 DIAGNOSIS — C770 Secondary and unspecified malignant neoplasm of lymph nodes of head, face and neck: Secondary | ICD-10-CM | POA: Diagnosis not present

## 2021-07-21 DIAGNOSIS — C184 Malignant neoplasm of transverse colon: Secondary | ICD-10-CM | POA: Diagnosis not present

## 2021-07-21 DIAGNOSIS — Z95828 Presence of other vascular implants and grafts: Secondary | ICD-10-CM

## 2021-07-21 DIAGNOSIS — E064 Drug-induced thyroiditis: Secondary | ICD-10-CM

## 2021-07-21 LAB — CBC WITH DIFFERENTIAL/PLATELET
Abs Immature Granulocytes: 0.03 10*3/uL (ref 0.00–0.07)
Basophils Absolute: 0 10*3/uL (ref 0.0–0.1)
Basophils Relative: 1 %
Eosinophils Absolute: 0.2 10*3/uL (ref 0.0–0.5)
Eosinophils Relative: 2 %
HCT: 43.1 % (ref 36.0–46.0)
Hemoglobin: 13.9 g/dL (ref 12.0–15.0)
Immature Granulocytes: 0 %
Lymphocytes Relative: 24 %
Lymphs Abs: 1.8 10*3/uL (ref 0.7–4.0)
MCH: 31 pg (ref 26.0–34.0)
MCHC: 32.3 g/dL (ref 30.0–36.0)
MCV: 96 fL (ref 80.0–100.0)
Monocytes Absolute: 0.7 10*3/uL (ref 0.1–1.0)
Monocytes Relative: 10 %
Neutro Abs: 4.6 10*3/uL (ref 1.7–7.7)
Neutrophils Relative %: 63 %
Platelets: 202 10*3/uL (ref 150–400)
RBC: 4.49 MIL/uL (ref 3.87–5.11)
RDW: 13.4 % (ref 11.5–15.5)
WBC: 7.3 10*3/uL (ref 4.0–10.5)
nRBC: 0 % (ref 0.0–0.2)

## 2021-07-21 LAB — COMPREHENSIVE METABOLIC PANEL
ALT: 16 U/L (ref 0–44)
AST: 23 U/L (ref 15–41)
Albumin: 4.4 g/dL (ref 3.5–5.0)
Alkaline Phosphatase: 67 U/L (ref 38–126)
Anion gap: 8 (ref 5–15)
BUN: 13 mg/dL (ref 8–23)
CO2: 27 mmol/L (ref 22–32)
Calcium: 9.4 mg/dL (ref 8.9–10.3)
Chloride: 102 mmol/L (ref 98–111)
Creatinine, Ser: 0.61 mg/dL (ref 0.44–1.00)
GFR, Estimated: >60 mL/min (ref >60)
Glucose, Bld: 99 mg/dL (ref 70–99)
Potassium: 4.4 mmol/L (ref 3.5–5.1)
Sodium: 137 mmol/L (ref 135–145)
Total Bilirubin: 0.5 mg/dL (ref 0.3–1.2)
Total Protein: 7.9 g/dL (ref 6.5–8.1)

## 2021-07-21 LAB — TSH: TSH: 2.342 u[IU]/mL (ref 0.350–4.500)

## 2021-07-21 LAB — MAGNESIUM: Magnesium: 2.4 mg/dL (ref 1.7–2.4)

## 2021-07-21 MED ORDER — HEPARIN SOD (PORK) LOCK FLUSH 100 UNIT/ML IV SOLN
500.0000 [IU] | Freq: Once | INTRAVENOUS | Status: AC | PRN
  Administered 2021-07-21: 500 [IU]

## 2021-07-21 MED ORDER — SODIUM CHLORIDE 0.9% FLUSH
10.0000 mL | INTRAVENOUS | Status: DC | PRN
  Administered 2021-07-21: 10 mL

## 2021-07-21 MED ORDER — SODIUM CHLORIDE 0.9 % IV SOLN: Freq: Once | INTRAVENOUS | Status: AC

## 2021-07-21 MED ORDER — SODIUM CHLORIDE 0.9 % IV SOLN
200.0000 mg | Freq: Once | INTRAVENOUS | Status: AC
  Administered 2021-07-21: 200 mg via INTRAVENOUS
  Filled 2021-07-21: qty 8

## 2021-07-21 NOTE — Progress Notes (Signed)
Patient presents today for treatment. Vital signs and labs within parameters for treatment today. Patient has no complaints at this time. Patient denies any side effects from last treatment. MAR reviewed and updated.   Keytruda given today per MD orders. Tolerated infusion without adverse affects. Vital signs stable. No complaints at this time. Discharged from clinic ambulatory in stable condition. Alert and oriented x 3. F/U with [facility] as scheduled.

## 2021-07-21 NOTE — Patient Instructions (Signed)
[facility]  Discharge Instructions: Thank you for choosing [facility] to provide your oncology and hematology care.  If you have a lab appointment with the Cancer Center, please come in thru the Main Entrance and check in at the main information desk.  Wear comfortable clothing and clothing appropriate for easy access to any Portacath or PICC line.   We strive to give you quality time with your provider. You may need to reschedule your appointment if you arrive late (15 or more minutes).  Arriving late affects you and other patients whose appointments are after yours.  Also, if you miss three or more appointments without notifying the office, you may be dismissed from the clinic at the provider's discretion.      For prescription refill requests, have your pharmacy contact our office and allow 72 hours for refills to be completed.    Today you received the following chemotherapy and/or immunotherapy agents Keytruda       To help prevent nausea and vomiting after your treatment, we encourage you to take your nausea medication as directed.  BELOW ARE SYMPTOMS THAT SHOULD BE REPORTED IMMEDIATELY: *FEVER GREATER THAN 100.4 F (38 C) OR HIGHER *CHILLS OR SWEATING *NAUSEA AND VOMITING THAT IS NOT CONTROLLED WITH YOUR NAUSEA MEDICATION *UNUSUAL SHORTNESS OF BREATH *UNUSUAL BRUISING OR BLEEDING *URINARY PROBLEMS (pain or burning when urinating, or frequent urination) *BOWEL PROBLEMS (unusual diarrhea, constipation, pain near the anus) TENDERNESS IN MOUTH AND THROAT WITH OR WITHOUT PRESENCE OF ULCERS (sore throat, sores in mouth, or a toothache) UNUSUAL RASH, SWELLING OR PAIN  UNUSUAL VAGINAL DISCHARGE OR ITCHING   Items with * indicate a potential emergency and should be followed up as soon as possible or go to the Emergency Department if any problems should occur.  Please show the CHEMOTHERAPY ALERT CARD or IMMUNOTHERAPY ALERT CARD at check-in to the Emergency  Department and triage nurse.  Should you have questions after your visit or need to cancel or reschedule your appointment, please contact [facility] [phone]  and follow the prompts.  Office hours are 8:00 a.m. to 4:30 p.m. Monday - Friday. Please note that voicemails left after 4:00 p.m. may not be returned until the following business day.  We are closed weekends and major holidays. You have access to a nurse at all times for urgent questions. Please call the main number to the clinic [phone] and follow the prompts.  For any non-urgent questions, you may also contact your provider using MyChart. We now offer e-Visits for anyone 18 and older to request care online for non-urgent symptoms. For details visit [email or web address].   Also download the MyChart app! Go to the app store, search "MyChart", open the app, select [place], and log in with your MyChart username and password.  Due to Covid, a mask is required upon entering the hospital/clinic. If you do not have a mask, one will be given to you upon arrival. For doctor visits, patients may have 1 support person aged 18 or older with them. For treatment visits, patients cannot have anyone with them due to current Covid guidelines and our immunocompromised population.  

## 2021-08-04 ENCOUNTER — Ambulatory Visit (HOSPITAL_COMMUNITY)
Admission: RE | Admit: 2021-08-04 | Discharge: 2021-08-04 | Disposition: A | Discharge status: Home / Self Care | Payer: Medicare Other | Source: Ambulatory Visit | Attending: Hematology | Admitting: Hematology

## 2021-08-04 ENCOUNTER — Other Ambulatory Visit: Payer: Self-pay

## 2021-08-04 DIAGNOSIS — C189 Malignant neoplasm of colon, unspecified: Secondary | ICD-10-CM | POA: Diagnosis not present

## 2021-08-04 DIAGNOSIS — C184 Malignant neoplasm of transverse colon: Secondary | ICD-10-CM | POA: Insufficient documentation

## 2021-08-04 DIAGNOSIS — K769 Liver disease, unspecified: Secondary | ICD-10-CM | POA: Diagnosis not present

## 2021-08-04 DIAGNOSIS — Z9049 Acquired absence of other specified parts of digestive tract: Secondary | ICD-10-CM | POA: Diagnosis not present

## 2021-08-04 DIAGNOSIS — R590 Localized enlarged lymph nodes: Secondary | ICD-10-CM | POA: Diagnosis not present

## 2021-08-04 DIAGNOSIS — K5730 Diverticulosis of large intestine without perforation or abscess without bleeding: Secondary | ICD-10-CM | POA: Diagnosis not present

## 2021-08-04 DIAGNOSIS — R918 Other nonspecific abnormal finding of lung field: Secondary | ICD-10-CM | POA: Diagnosis not present

## 2021-08-04 MED ORDER — IOHEXOL 300 MG/ML  SOLN
100.0000 mL | Freq: Once | INTRAMUSCULAR | Status: AC | PRN
  Administered 2021-08-04: 100 mL via INTRAVENOUS

## 2021-08-11 ENCOUNTER — Inpatient Hospital Stay (HOSPITAL_COMMUNITY): Payer: Medicare Other

## 2021-08-11 ENCOUNTER — Inpatient Hospital Stay (HOSPITAL_COMMUNITY): Payer: Medicare Other | Attending: Hematology

## 2021-08-11 ENCOUNTER — Other Ambulatory Visit: Payer: Self-pay

## 2021-08-11 ENCOUNTER — Inpatient Hospital Stay (HOSPITAL_BASED_OUTPATIENT_CLINIC_OR_DEPARTMENT_OTHER): Payer: Medicare Other | Admitting: Hematology

## 2021-08-11 VITALS — BP 143/69 | HR 64 | Temp 98.3°F | Resp 18

## 2021-08-11 VITALS — BP 132/86 | HR 89 | Temp 98.1°F | Resp 16 | Ht 63.0 in | Wt 152.8 lb

## 2021-08-11 DIAGNOSIS — C770 Secondary and unspecified malignant neoplasm of lymph nodes of head, face and neck: Secondary | ICD-10-CM | POA: Diagnosis not present

## 2021-08-11 DIAGNOSIS — Z5112 Encounter for antineoplastic immunotherapy: Secondary | ICD-10-CM | POA: Insufficient documentation

## 2021-08-11 DIAGNOSIS — Z95828 Presence of other vascular implants and grafts: Secondary | ICD-10-CM

## 2021-08-11 DIAGNOSIS — C787 Secondary malignant neoplasm of liver and intrahepatic bile duct: Secondary | ICD-10-CM | POA: Insufficient documentation

## 2021-08-11 DIAGNOSIS — C184 Malignant neoplasm of transverse colon: Secondary | ICD-10-CM | POA: Insufficient documentation

## 2021-08-11 DIAGNOSIS — C189 Malignant neoplasm of colon, unspecified: Secondary | ICD-10-CM

## 2021-08-11 DIAGNOSIS — Z79899 Other long term (current) drug therapy: Secondary | ICD-10-CM | POA: Diagnosis not present

## 2021-08-11 DIAGNOSIS — C7800 Secondary malignant neoplasm of unspecified lung: Secondary | ICD-10-CM | POA: Diagnosis not present

## 2021-08-11 DIAGNOSIS — E064 Drug-induced thyroiditis: Secondary | ICD-10-CM

## 2021-08-11 LAB — CBC WITH DIFFERENTIAL/PLATELET
Abs Immature Granulocytes: 0.01 10*3/uL (ref 0.00–0.07)
Basophils Absolute: 0 10*3/uL (ref 0.0–0.1)
Basophils Relative: 1 %
Eosinophils Absolute: 0.2 10*3/uL (ref 0.0–0.5)
Eosinophils Relative: 2 %
HCT: 42.4 % (ref 36.0–46.0)
Hemoglobin: 13.7 g/dL (ref 12.0–15.0)
Immature Granulocytes: 0 %
Lymphocytes Relative: 24 %
Lymphs Abs: 1.7 10*3/uL (ref 0.7–4.0)
MCH: 30.9 pg (ref 26.0–34.0)
MCHC: 32.3 g/dL (ref 30.0–36.0)
MCV: 95.5 fL (ref 80.0–100.0)
Monocytes Absolute: 0.7 10*3/uL (ref 0.1–1.0)
Monocytes Relative: 10 %
Neutro Abs: 4.6 10*3/uL (ref 1.7–7.7)
Neutrophils Relative %: 63 %
Platelets: 201 10*3/uL (ref 150–400)
RBC: 4.44 MIL/uL (ref 3.87–5.11)
RDW: 13.3 % (ref 11.5–15.5)
WBC: 7.3 10*3/uL (ref 4.0–10.5)
nRBC: 0 % (ref 0.0–0.2)

## 2021-08-11 LAB — TSH: TSH: 2.727 u[IU]/mL (ref 0.350–4.500)

## 2021-08-11 LAB — COMPREHENSIVE METABOLIC PANEL
ALT: 20 U/L (ref 0–44)
AST: 25 U/L (ref 15–41)
Albumin: 4.4 g/dL (ref 3.5–5.0)
Alkaline Phosphatase: 69 U/L (ref 38–126)
Anion gap: 7 (ref 5–15)
BUN: 14 mg/dL (ref 8–23)
CO2: 28 mmol/L (ref 22–32)
Calcium: 9.7 mg/dL (ref 8.9–10.3)
Chloride: 102 mmol/L (ref 98–111)
Creatinine, Ser: 0.64 mg/dL (ref 0.44–1.00)
GFR, Estimated: >60 mL/min (ref >60)
Glucose, Bld: 101 mg/dL — ABNORMAL HIGH (ref 70–99)
Potassium: 4.8 mmol/L (ref 3.5–5.1)
Sodium: 137 mmol/L (ref 135–145)
Total Bilirubin: 0.3 mg/dL (ref 0.3–1.2)
Total Protein: 8.1 g/dL (ref 6.5–8.1)

## 2021-08-11 LAB — MAGNESIUM: Magnesium: 2.2 mg/dL (ref 1.7–2.4)

## 2021-08-11 MED ORDER — SODIUM CHLORIDE 0.9% FLUSH
10.0000 mL | INTRAVENOUS | Status: DC | PRN
  Administered 2021-08-11: 10 mL

## 2021-08-11 MED ORDER — SODIUM CHLORIDE 0.9 % IV SOLN: Freq: Once | INTRAVENOUS | Status: AC

## 2021-08-11 MED ORDER — HEPARIN SOD (PORK) LOCK FLUSH 100 UNIT/ML IV SOLN
500.0000 [IU] | Freq: Once | INTRAVENOUS | Status: AC | PRN
  Administered 2021-08-11: 500 [IU]

## 2021-08-11 MED ORDER — SODIUM CHLORIDE 0.9 % IV SOLN
200.0000 mg | Freq: Once | INTRAVENOUS | Status: AC
  Administered 2021-08-11: 200 mg via INTRAVENOUS
  Filled 2021-08-11: qty 8

## 2021-08-11 NOTE — Progress Notes (Signed)
Pt presents today for Keytruda per provider's order. Vital signs and labs WNL for treatment today. Pt voiced no new complaints at this time.Okay to proceed with treatment today per Dr.K.  Beryle Flock given today per MD orders. Tolerated infusion without adverse affects. Vital signs stable. No complaints at this time. Discharged from clinic ambulatory in stable condition. Alert and oriented x 3. F/U with [facility] as scheduled.

## 2021-08-11 NOTE — Patient Instructions (Signed)
[facility]  Discharge Instructions: Thank you for choosing [facility] to provide your oncology and hematology care.  If you have a lab appointment with the Cancer Center, please come in thru the Main Entrance and check in at the main information desk.  Wear comfortable clothing and clothing appropriate for easy access to any Portacath or PICC line.   We strive to give you quality time with your provider. You may need to reschedule your appointment if you arrive late (15 or more minutes).  Arriving late affects you and other patients whose appointments are after yours.  Also, if you miss three or more appointments without notifying the office, you may be dismissed from the clinic at the provider's discretion.      For prescription refill requests, have your pharmacy contact our office and allow 72 hours for refills to be completed.    Today you received the following chemotherapy and/or immunotherapy agents Keytruda       To help prevent nausea and vomiting after your treatment, we encourage you to take your nausea medication as directed.  BELOW ARE SYMPTOMS THAT SHOULD BE REPORTED IMMEDIATELY: *FEVER GREATER THAN 100.4 F (38 C) OR HIGHER *CHILLS OR SWEATING *NAUSEA AND VOMITING THAT IS NOT CONTROLLED WITH YOUR NAUSEA MEDICATION *UNUSUAL SHORTNESS OF BREATH *UNUSUAL BRUISING OR BLEEDING *URINARY PROBLEMS (pain or burning when urinating, or frequent urination) *BOWEL PROBLEMS (unusual diarrhea, constipation, pain near the anus) TENDERNESS IN MOUTH AND THROAT WITH OR WITHOUT PRESENCE OF ULCERS (sore throat, sores in mouth, or a toothache) UNUSUAL RASH, SWELLING OR PAIN  UNUSUAL VAGINAL DISCHARGE OR ITCHING   Items with * indicate a potential emergency and should be followed up as soon as possible or go to the Emergency Department if any problems should occur.  Please show the CHEMOTHERAPY ALERT CARD or IMMUNOTHERAPY ALERT CARD at check-in to the Emergency  Department and triage nurse.  Should you have questions after your visit or need to cancel or reschedule your appointment, please contact [facility] [phone]  and follow the prompts.  Office hours are 8:00 a.m. to 4:30 p.m. Monday - Friday. Please note that voicemails left after 4:00 p.m. may not be returned until the following business day.  We are closed weekends and major holidays. You have access to a nurse at all times for urgent questions. Please call the main number to the clinic [phone] and follow the prompts.  For any non-urgent questions, you may also contact your provider using MyChart. We now offer e-Visits for anyone 18 and older to request care online for non-urgent symptoms. For details visit [email or web address].   Also download the MyChart app! Go to the app store, search "MyChart", open the app, select , and log in with your MyChart username and password.  Due to Covid, a mask is required upon entering the hospital/clinic. If you do not have a mask, one will be given to you upon arrival. For doctor visits, patients may have 1 support person aged 18 or older with them. For treatment visits, patients cannot have anyone with them due to current Covid guidelines and our immunocompromised population.  

## 2021-08-11 NOTE — Progress Notes (Signed)
[place], [place] [address]   CLINIC:  Medical Oncology/Hematology  PCP:  [facility], [place] / [place] [id number] [phone]   REASON FOR VISIT:  Follow-up for transverse colon adenocarcinoma  PRIOR THERAPY:  1. Right hemicolectomy on 12/29/2013. 2. FOLFOX x 3 cycles from 02/10/2014 to 03/10/2014, stopped secondary to poor tolerance  NGS Results: MSI high, MMR deficient, BRAF V600E, TMB high  CURRENT THERAPY: Keytruda every 3 weeks  BRIEF ONCOLOGIC HISTORY:  Oncology History  Adenocarcinoma of transverse colon ([facility]) (Resolved)  12/09/2013 Initial Diagnosis   1. Colon, biopsy, proximal transverse mass INVASIVE ADENOCARCINOMA.   12/29/2013 Definitive Surgery   Colon, segmental resection for tumor, right - INVASIVE COLORECTAL ADENOCARCINOMA, 7 CM, EXTENDING INTO PERICOLONIC CONNECTIVE TISSUE. - METASTATIC CARCINOMA IN 6 OF 33 LYMPH NODES (6/33).   02/10/2014 - 03/10/2014 Adjuvant Chemotherapy   FOLFOX x 3 cycles   03/11/2014 Adverse Reaction   Patient called reporting diarrhea despite dose reduction and she wants to stop therapy.  Patient seen on 03/24/14 to discuss other treatment options and she stands by her decision to stop all therapy.   04/01/2014 Surgery   Port-a-cath removal by Dr. Arnoldo Morale.   01/12/2015 PET scan   Mild abdominal mesenteric LAD show hypermetabolic activity, 7 mm indeterminate pulm nodule in sup segment of RLL shows no assoc metabolic activity   0/14/1030 PET scan   Mild progression of nodal mets in anterior mid abdominal mesentery, new nodal mets at L thoracic inlet. stable 6 mm nodule in posterior RLL, unchanged since 2015   06/22/2015 PET scan   Resolution of metabolic activity and decreased size of mild LAD at L thoracic inlet, stable mild mid abd hypermetabolic mesenteric LAD, stable 6 mm posterior LLL pulm nodule without metabolic activity   08/09/4386 Imaging   MRI lumbar spine L4-L5 disc  degenerated, broad based disc hernation, B facet arthropathy with hypertophy and edema, stenosis of lateral recesses that could cause neural compression   10/18/2016 Imaging   CT CAP- 1. Several small ground-glass pulmonary nodules are present in the lungs and are stable over the past 9 months, but several are mildly larger than they were in 2015. Low-grade adenocarcinoma can sometimes have this appearance and surveillance is likely warranted. 2. There is a cluster of nodal tissue in the central mesentery, maximum short axis diameter 1.3 cm. This nodal cluster is relatively low in density and not appreciably changed from the prior exam. Given the lack of change is may represent quiescent residua of malignancy, but again, surveillance is likely warranted. 3. The left-sided rib fractures are more numerous than was revealed on the prior radiographs ; there are nondisplaced healing fractures of the left fifth, sixth, seventh, eighth, ninth, and tenth ribs. 4. Mild cardiomegaly although part of this appearance may be due to pectus excavatum. 5. Several additional small pulmonary nodules are stable from 2015 and considered benign. 6. Right hemicolectomy and sigmoid colon diverticulosis. 7. Small right paraumbilical hernia containing adipose tissue. 8. Lower lumbar spondylosis and degenerative disc disease potentially with mild impingement at L4-5.   04/25/2017 Imaging   CT C/A/P: Scattered solid pulmonary nodules measure up to 6 mm in the right lower lobe, unchanged. There are a few scattered ground-glass nodules measuring up to 8 mm in the right upper lobe, also stable. Distal perigastric lymph nodes are stable. No additional evidence of metastatic disease.   Metastatic colon cancer to liver ([place])  11/04/2020 Initial Diagnosis  Metastatic colon cancer to liver [facility])   11/10/2020 - 12/10/2020 Chemotherapy          01/13/2021 -  Chemotherapy   Patient is on Treatment Plan : COLORECTAL  Pembrolizumab q21d       CANCER STAGING:  Cancer Staging  No matching staging information was found for the patient.  INTERVAL HISTORY:  Amanda Norris, a 76 y.o. female, returns for routine follow-up of her transverse colon adenocarcinoma. Amanda Norris was last seen on 06/30/2021.   Today she reports feeling good. She reports pain in her left groin which radiates down into her leg.  Her diarrhea is stable and she continues to take Imodium as needed. She denies skin rash. Her appetite is good, and the neuropathy in her feet is stable.   REVIEW OF SYSTEMS:  Review of Systems  Constitutional:  Negative for appetite change and fatigue.  Gastrointestinal:  Diarrhea: stable.  Skin:  Negative for rash.  Neurological:  Numbness: stable.  All other systems reviewed and are negative.  PAST MEDICAL/SURGICAL HISTORY:  Past Medical History:  Diagnosis Date   Arthritis    wrist and thumbs   Colon cancer (HCC)    PONV (postoperative nausea and vomiting)    Port-A-Cath in place 11/09/2020   Ruptured lumbar disc    L1-L5 per patient report   Sciatica of left side    Past Surgical History:  Procedure Laterality Date   COLONOSCOPY N/A 12/12/2013   Procedure: COLONOSCOPY;  Surgeon: Rogene Houston, MD;  [facility];  Service: Endoscopy;  Laterality: N/A;  730   COLONOSCOPY N/A 01/20/2015   Procedure: COLONOSCOPY;  Surgeon: Rogene Houston, MD;  [facility];  Service: Endoscopy;  Laterality: N/A;  1030   COLONOSCOPY N/A 03/28/2018   Procedure: COLONOSCOPY;  Surgeon: Rogene Houston, MD;  [facility];  Service: Endoscopy;  Laterality: N/A;  1015   EUS N/A 12/18/2013   Procedure: UPPER ENDOSCOPIC ULTRASOUND (EUS) LINEAR;  Surgeon: Milus Banister, MD;  [facility]: [facility];  Service: Endoscopy;  Laterality: N/A;   herniated disc     [id number]   IR IMAGING GUIDED PORT INSERTION  10/14/2020   IR US GUIDANCE  10/14/2020   PARTIAL COLECTOMY N/A 12/29/2013   Procedure: RIGHT  HEMICOLECTOMY;  Surgeon: Jamesetta So, MD;  Location: [facility];  Service: General;  Laterality: N/A;   POLYPECTOMY  03/28/2018   Procedure: POLYPECTOMY;  Surgeon: Rogene Houston, MD;  [facility];  Service: Endoscopy;;  transverse colon (hot snare);   PORT-A-CATH REMOVAL Right 04/01/2014   Procedure: MINOR REMOVAL PORT-A-CATH;  Surgeon: Jamesetta So, MD;  Location: [facility];  Service: General;  Laterality: Right;   PORTACATH PLACEMENT Right 02/04/2014   Procedure: INSERTION PORT-A-CATH;  Surgeon: Jamesetta So, MD;  Location: [facility];  Service: General;  Laterality: Right;    SOCIAL HISTORY:  Social History   Socioeconomic History   Marital status: Married    Spouse name: Not on file   Number of children: Not on file   Years of education: Not on file   Highest education level: Not on file  Occupational History   Not on file  Tobacco Use   Smoking status: Former    Packs/day: 0.25    Years: 5.00    Pack years: 1.25    Types: Cigarettes    Quit date: 04/19/1959    Years since quitting: 62.3   Smokeless tobacco: Never   Tobacco  comments:    smoked for 5 years as teenager  Vaping Use   Vaping Use: Never used  Substance and Sexual Activity   Alcohol use: No   Drug use: No   Sexual activity: Yes    Birth control/protection: Post-menopausal  Other Topics Concern   Not on file  Social History Narrative   Not on file   Social Determinants of Health   Financial Resource Strain: Low Risk    Difficulty of Paying Living Expenses: Not hard at all  Food Insecurity: No Food Insecurity   Worried About Charity fundraiser in the Last Year: Never true   Animas in the Last Year: Never true  Transportation Needs: No Transportation Needs   Lack of Transportation (Medical): No   Lack of Transportation (Non-Medical): No  Physical Activity: Inactive   Days of Exercise per Week: 0 days   Minutes of Exercise per Session: 0 min  Stress: Stress Concern Present    Feeling of Stress : To some extent  Social Connections: Engineer, building services of Communication with Friends and Family: More than three times a week   Frequency of Social Gatherings with Friends and Family: More than three times a week   Attends Religious Services: More than 4 times per year   Active Member of [employer] or Organizations: Yes   Attends Music therapist: More than 4 times per year   Marital Status: Married  Human resources officer Violence: Not At Risk   Fear of Current or Ex-Partner: No   Emotionally Abused: No   Physically Abused: No   Sexually Abused: No    FAMILY HISTORY:  Family History  Problem Relation Age of Onset   Diabetes type II Mother    Dementia Father     CURRENT MEDICATIONS:  Current Outpatient Medications  Medication Sig Dispense Refill   acetaminophen (TYLENOL) 500 MG tablet Take 500 mg by mouth every 6 (six) hours as needed for moderate pain or headache.     aspirin 81 MG EC tablet Take 1 tablet (81 mg total) by mouth daily. Swallow whole. 30 tablet 12   Bioflavonoid Products (ESTER C PO) Take 1,000 mg by mouth daily.     Cholecalciferol (D3-1000) 25 MCG (1000 UT) capsule Take 125 Units by mouth daily.     Cranberry-Vitamin C-Vitamin E 4200-20-3 MG-MG-UNIT CAPS Take 500 mg by mouth daily.     Garlic 5409 MG CAPS Take 1,000 mg by mouth daily.      Multiple Vitamin (MULTIVITAMIN) tablet Take 1 tablet by mouth daily.     Omega-3 Fatty Acids (FISH OIL) 1200 MG CAPS Take 1,000 mg by mouth daily.     OVER THE COUNTER MEDICATION Take 1,000 mg by mouth daily. [place] 1000 mg daily     OVER THE COUNTER MEDICATION Take 1,000 mg by mouth daily. L-Lysine 1062m po daily     pramoxine-hydrocortisone (PROCTOCREAM-HC) 1-1 % rectal cream Place 1 application rectally 2 (two) times daily. 30 g 0   zinc gluconate 50 MG tablet Take 50 mg by mouth daily.     No current facility-administered medications for this visit.    ALLERGIES:  Allergies   Allergen Reactions   Sulfa Antibiotics Other (See Comments)    "Sores in my mouth"    PHYSICAL EXAM:  Performance status (ECOG): 1 - Symptomatic but completely ambulatory  Vitals:   08/11/21 1312  BP: 132/86  Pulse: 89  Resp: 16  Temp: 98.1 F (36.7  C)  SpO2: 100%   Wt Readings from Last 3 Encounters:  08/11/21 152 lb 12.5 oz (69.3 kg)  07/21/21 151 lb 14.4 oz (68.9 kg)  06/30/21 152 lb 4.8 oz (69.1 kg)   Physical Exam Vitals reviewed.  Constitutional:      Appearance: Normal appearance.  Cardiovascular:     Rate and Rhythm: Normal rate and regular rhythm.     Pulses: Normal pulses.     Heart sounds: Normal heart sounds.  Pulmonary:     Effort: Pulmonary effort is normal.     Breath sounds: Normal breath sounds.  Abdominal:     Palpations: Abdomen is soft. There is no hepatomegaly, splenomegaly or mass.     Tenderness: There is no abdominal tenderness.  Musculoskeletal:     Right lower leg: No edema.     Left lower leg: No edema.  Lymphadenopathy:     Upper Body:     Right upper body: No supraclavicular, axillary or pectoral adenopathy.     Left upper body: No supraclavicular, axillary or pectoral adenopathy.  Neurological:     General: No focal deficit present.     Mental Status: She is alert and oriented to person, place, and time.  Psychiatric:        Mood and Affect: Mood normal.        Behavior: Behavior normal.     LABORATORY DATA:  I have reviewed the labs as listed.  CBC Latest Ref Rng & Units 08/11/2021 07/21/2021 06/30/2021  WBC 4.0 - 10.5 K/uL 7.3 7.3 6.4  Hemoglobin 12.0 - 15.0 g/dL 13.7 13.9 13.6  Hematocrit 36.0 - 46.0 % 42.4 43.1 42.2  Platelets 150 - 400 K/uL 201 202 194   CMP Latest Ref Rng & Units 08/11/2021 07/21/2021 06/30/2021  Glucose 70 - 99 mg/dL 101(H) 99 99  BUN 8 - 23 mg/dL _0 Creatinine 0.44 - 1.00 mg/dL 0.64 0.61 0.64  Sodium 135 - 145 mmol/L 137 137 136  Potassium 3.5 - 5.1 mmol/L 4.8 4.4 3.7  Chloride 98 - 111 mmol/L  102 102 101  CO2 22 - 32 mmol/L _1 Calcium 8.9 - 10.3 mg/dL 9.7 9.4 9.2  Total Protein 6.5 - 8.1 g/dL 8.1 7.9 8.2(H)  Total Bilirubin 0.3 - 1.2 mg/dL 0.3 0.5 0.3  Alkaline Phos 38 - 126 U/L 69 67 67  AST 15 - 41 U/L _2 ALT 0 - 44 U/L _3 DIAGNOSTIC IMAGING:  I have independently reviewed the scans and discussed with the patient. CT CHEST ABDOMEN PELVIS W CONTRAST  Result Date: 08/05/2021 CLINICAL DATA:  Follow-up metastatic colon carcinoma. Assess treatment response. EXAM: CT CHEST, ABDOMEN, AND PELVIS WITH CONTRAST TECHNIQUE: Multidetector CT imaging of the chest, abdomen and pelvis was performed following the standard protocol during bolus administration of intravenous contrast. RADIATION DOSE REDUCTION: This exam was performed according to the departmental dose-optimization program which includes automated exposure control, adjustment of the mA and/or kV according to patient size and/or use of iterative reconstruction technique. CONTRAST:  185m OMNIPAQUE IOHEXOL 300 MG/ML  SOLN COMPARISON:  03/31/2021 FINDINGS: CT CHEST FINDINGS Cardiovascular: No acute findings. Mediastinum/Lymph Nodes: Low-attenuation lymphadenopathy in the left supraclavicular region and throughout the mediastinum is again seen, without significant change. Largest lymph nodes are located in the left supraclavicular region measuring 2.1 cm short axis, and the precarinal region measuring 1.7 cm short axis, without significant change since prior exam. Lymphadenopathy in the lower  middle mediastinum along the medial aspect of the descending aorta also shows no significant change, measuring 1.9 cm short axis compared to 2.0 cm previously. No new or increased sites of lymphadenopathy identified. Lungs/Pleura: Innumerable small bilateral pulmonary nodules are again seen throughout both lungs. These show no significant change in size or number, consistent with diffuse pulmonary metastases. No evidence of pulmonary  consolidation or pleural effusion. Musculoskeletal:  No suspicious bone lesions identified. CT ABDOMEN AND PELVIS FINDINGS Hepatobiliary: 2.5 x 1.9 cm low-attenuation lesion in segment 4 B of the left lobe shows no significant change. No new or enlarging liver lesions identified. Gallbladder is unremarkable. No evidence of biliary ductal dilatation. Pancreas:  No mass or inflammatory changes. Spleen:  Within normal limits in size and appearance. Adrenals/Urinary tract:  No masses or hydronephrosis. Stomach/Bowel: Prior right hemicolectomy again noted. No evidence of obstruction, inflammatory process, or abnormal fluid collections. Diverticulosis is seen mainly involving the descending and sigmoid colon, however there is no evidence of diverticulitis. Vascular/Lymphatic: Low-attenuation lymphadenopathy is again seen throughout the bilateral retrocrural spaces, aortocaval and left paraaortic spaces, and the central small bowel mesentery. Largest area lymphadenopathy in the central mesentery measures 5.9 x 4.2 cm on image 76/2, without significant change compared to prior study. No new or increased sites of lymphadenopathy identified within the abdomen or pelvis. Reproductive:  No mass or other significant abnormality identified. Other:  None. Musculoskeletal:  No suspicious bone lesions identified. IMPRESSION: Stable low-attenuation lymphadenopathy throughout the chest and abdomen. Stable diffuse bilateral pulmonary metastases. Stable small low-attenuation left hepatic lobe lesion. No evidence of new or progressive metastatic disease. Colonic diverticulosis, without radiographic evidence of diverticulitis. Electronically Signed   By: Marlaine Hind M.D.   On: 08/05/2021 15:40     ASSESSMENT:  1.  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 - Status post 3 cycles of FOLFOX  from 02/10/2014 through 03/10/2014, discontinued secondary to poor tolerance.  DPD was negative. -Last colonoscopy on 03/28/2018 patent ileo-colonic anastomosis.  8 mm polyp in the transverse colon.  Diverticulosis in the sigmoid colon, descending colon. - CT scan on 11/04/2018 shows postoperative findings of right colectomy with redemonstrated mesenteric nodule/lymph node measuring 1.9 x 1.7 cm, unchanged from prior scan in October 2019.  No evidence of metastatic disease.  Stable solid and groundglass pulmonary nodules, stable over multiple prior exams. -CEA was 4.6 on 10/28/2018. -CTAP on 09/21/2020 showed numerous small pulmonary nodules throughout the lung bases, 5 mm.  Enlarged retrocrural periaortic lymph node in the lower chest.  Soft tissue nodule within the central mesentery substantially increased in size, encasing proximal superior mesenteric artery and measuring 5 x 4.3 cm.  Numerous retroperitoneal lymph nodes, largest aortocaval lymph node measuring 2.8 x 2.2 cm. -CT chest on 09/27/2020 shows a left thoracic inlet lymph node approximately 2.3 cm.  Mediastinal lymph nodes and multiple subcentimeter pulmonary nodules suggestive of metastatic disease. -Mesenteric lymph node biopsy on 09/28/2020 shows mucin and fibrosis. -Left supraclavicular lymph node biopsy on 10/14/2020-soft tissue with abundant mucin, rare fragments of atypical columnar epithelium. - 3 cycles of FOLFIRI and bevacizumab dose reduced from 11/10/2020 through 12/08/2020. - [facility] test-BRAF V600 E+, MMR deficient, MSI high, TMB high, HER2 negative, BRCA1/2 pathogenic variant on exon 14 and exon 9 respectively.  The report also suggested decreased benefit to FOLFOX and bevacizumab in the first-line metastatic setting. - Cycle 1 of Keytruda on 01/13/2021.  2.  Health maintenance: -Mammogram dated 05/16/2018 was BI-RADS Category 1.   PLAN:  1.  Metastatic colon cancer to the liver, lungs and lymph nodes: - She does not report any  immunotherapy related side effects. - Last CEA has improved to 7.1.  Labs today shows normal CBC and LFTs. - Reviewed CT CAP from 08/04/2021 which showed stable lymphadenopathy, bilateral lung nodules and a left hepatic lesion.  No new lesions were seen. - Recommend continuing immunotherapy every 3 weeks.  RTC 6 weeks for follow-up and toxicity assessment.   2.  Diarrhea: - Baseline diarrhea about 3-4 watery bowel movements per day stable since surgery. - Continue Imodium half tablet daily as needed.   3.  Peripheral neuropathy: - Burning sensation in the feet has improved.   4.  Nutrition: - Weight has been stable.  Not requiring any nutritional supplements.   Orders placed this encounter:  No orders of the defined types were placed in this encounter.    Derek Jack, MD [place] [phone]   I, Thana Ates, am acting as a scribe for Dr. Derek Jack.  I, Derek Jack MD, have reviewed the above documentation for accuracy and completeness, and I agree with the above.

## 2021-08-11 NOTE — Patient Instructions (Signed)
[place] at [facility] Discharge Instructions   You were seen and examined today by Dr. Delton Coombes.  He reviewed the results of your lab work and CT scan - all results are normal/stable.  We will proceed with your treatment today.  Return as scheduled for lab work, treatments, and office visit.    Thank you for choosing [place] at [facility] to provide your oncology and hematology care.  To afford each patient quality time with our provider, please arrive at least 15 minutes before your scheduled appointment time.   If you have a lab appointment with the [facility] please come in thru the Main Entrance and check in at the main information desk.  You need to re-schedule your appointment should you arrive 10 or more minutes late.  We strive to give you quality time with our providers, and arriving late affects you and other patients whose appointments are after yours.  Also, if you no show three or more times for appointments you may be dismissed from the clinic at the providers discretion.     Again, thank you for choosing [facility].  Our hope is that these requests will decrease the amount of time that you wait before being seen by our physicians.       _____________________________________________________________  Should you have questions after your visit to [facility], please contact our office at [phone] and follow the prompts.  Our office hours are 8:00 a.m. and 4:30 p.m. Monday - Friday.  Please note that voicemails left after 4:00 p.m. may not be returned until the following business day.  We are closed weekends and major holidays.  You do have access to a nurse 24-7, just call the main number to the clinic [phone] and do not press any options, hold on the line and a nurse will answer the phone.    For prescription refill requests, have your pharmacy contact our office and allow 72 hours.     Due to Covid, you will need to wear a mask upon entering the hospital. If you do not have a mask, a mask will be given to you at the Main Entrance upon arrival. For doctor visits, patients may have 1 support person age 64 or older with them. For treatment visits, patients can not have anyone with them due to social distancing guidelines and our immunocompromised population.

## 2021-08-11 NOTE — Progress Notes (Signed)
Patient has been examined by Dr. Katragadda, and vital signs and labs have been reviewed. ANC, Creatinine, LFTs, hemoglobin, and platelets are within treatment parameters per M.D. - pt may proceed with treatment.    °

## 2021-08-12 LAB — CEA: CEA: 6.1 ng/mL — ABNORMAL HIGH (ref 0.0–4.7)

## 2021-09-01 ENCOUNTER — Inpatient Hospital Stay (HOSPITAL_COMMUNITY): Payer: Medicare Other

## 2021-09-01 ENCOUNTER — Other Ambulatory Visit: Payer: Self-pay

## 2021-09-01 VITALS — BP 130/66 | HR 67 | Temp 98.3°F | Resp 18

## 2021-09-01 DIAGNOSIS — C184 Malignant neoplasm of transverse colon: Secondary | ICD-10-CM | POA: Diagnosis not present

## 2021-09-01 DIAGNOSIS — C7800 Secondary malignant neoplasm of unspecified lung: Secondary | ICD-10-CM | POA: Diagnosis not present

## 2021-09-01 DIAGNOSIS — C770 Secondary and unspecified malignant neoplasm of lymph nodes of head, face and neck: Secondary | ICD-10-CM | POA: Diagnosis not present

## 2021-09-01 DIAGNOSIS — Z95828 Presence of other vascular implants and grafts: Secondary | ICD-10-CM

## 2021-09-01 DIAGNOSIS — E064 Drug-induced thyroiditis: Secondary | ICD-10-CM

## 2021-09-01 DIAGNOSIS — C787 Secondary malignant neoplasm of liver and intrahepatic bile duct: Secondary | ICD-10-CM | POA: Diagnosis not present

## 2021-09-01 DIAGNOSIS — C189 Malignant neoplasm of colon, unspecified: Secondary | ICD-10-CM

## 2021-09-01 DIAGNOSIS — Z5112 Encounter for antineoplastic immunotherapy: Secondary | ICD-10-CM | POA: Diagnosis not present

## 2021-09-01 DIAGNOSIS — Z79899 Other long term (current) drug therapy: Secondary | ICD-10-CM | POA: Diagnosis not present

## 2021-09-01 LAB — COMPREHENSIVE METABOLIC PANEL
ALT: 17 U/L (ref 0–44)
AST: 24 U/L (ref 15–41)
Albumin: 4.5 g/dL (ref 3.5–5.0)
Alkaline Phosphatase: 69 U/L (ref 38–126)
Anion gap: 5 (ref 5–15)
BUN: 14 mg/dL (ref 8–23)
CO2: 30 mmol/L (ref 22–32)
Calcium: 9.3 mg/dL (ref 8.9–10.3)
Chloride: 101 mmol/L (ref 98–111)
Creatinine, Ser: 0.71 mg/dL (ref 0.44–1.00)
GFR, Estimated: >60 mL/min (ref >60)
Glucose, Bld: 96 mg/dL (ref 70–99)
Potassium: 4.4 mmol/L (ref 3.5–5.1)
Sodium: 136 mmol/L (ref 135–145)
Total Bilirubin: 0.6 mg/dL (ref 0.3–1.2)
Total Protein: 8.2 g/dL — ABNORMAL HIGH (ref 6.5–8.1)

## 2021-09-01 LAB — CBC WITH DIFFERENTIAL/PLATELET
Abs Immature Granulocytes: 0.02 10*3/uL (ref 0.00–0.07)
Basophils Absolute: 0.1 10*3/uL (ref 0.0–0.1)
Basophils Relative: 1 %
Eosinophils Absolute: 0.2 10*3/uL (ref 0.0–0.5)
Eosinophils Relative: 2 %
HCT: 40.7 % (ref 36.0–46.0)
Hemoglobin: 13.2 g/dL (ref 12.0–15.0)
Immature Granulocytes: 0 %
Lymphocytes Relative: 29 %
Lymphs Abs: 2.1 10*3/uL (ref 0.7–4.0)
MCH: 30.8 pg (ref 26.0–34.0)
MCHC: 32.4 g/dL (ref 30.0–36.0)
MCV: 94.9 fL (ref 80.0–100.0)
Monocytes Absolute: 0.8 10*3/uL (ref 0.1–1.0)
Monocytes Relative: 10 %
Neutro Abs: 4.2 10*3/uL (ref 1.7–7.7)
Neutrophils Relative %: 58 %
Platelets: 196 10*3/uL (ref 150–400)
RBC: 4.29 MIL/uL (ref 3.87–5.11)
RDW: 13.3 % (ref 11.5–15.5)
WBC: 7.2 10*3/uL (ref 4.0–10.5)
nRBC: 0 % (ref 0.0–0.2)

## 2021-09-01 LAB — MAGNESIUM: Magnesium: 2.3 mg/dL (ref 1.7–2.4)

## 2021-09-01 LAB — TSH: TSH: 2.36 u[IU]/mL (ref 0.350–4.500)

## 2021-09-01 MED ORDER — SODIUM CHLORIDE 0.9 % IV SOLN: Freq: Once | INTRAVENOUS | Status: AC

## 2021-09-01 MED ORDER — SODIUM CHLORIDE 0.9% FLUSH
10.0000 mL | INTRAVENOUS | Status: DC | PRN
  Administered 2021-09-01: 10 mL

## 2021-09-01 MED ORDER — HEPARIN SOD (PORK) LOCK FLUSH 100 UNIT/ML IV SOLN
500.0000 [IU] | Freq: Once | INTRAVENOUS | Status: AC | PRN
  Administered 2021-09-01: 500 [IU]

## 2021-09-01 MED ORDER — SODIUM CHLORIDE 0.9 % IV SOLN
200.0000 mg | Freq: Once | INTRAVENOUS | Status: AC
  Administered 2021-09-01: 200 mg via INTRAVENOUS
  Filled 2021-09-01: qty 8

## 2021-09-01 NOTE — Patient Instructions (Signed)
[facility]  Discharge Instructions: Thank you for choosing [facility] to provide your oncology and hematology care.  If you have a lab appointment with the Cancer Center, please come in thru the Main Entrance and check in at the main information desk.  Wear comfortable clothing and clothing appropriate for easy access to any Portacath or PICC line.   We strive to give you quality time with your provider. You may need to reschedule your appointment if you arrive late (15 or more minutes).  Arriving late affects you and other patients whose appointments are after yours.  Also, if you miss three or more appointments without notifying the office, you may be dismissed from the clinic at the provider's discretion.      For prescription refill requests, have your pharmacy contact our office and allow 72 hours for refills to be completed.    Today you received the following chemotherapy and/or immunotherapy agents keytruda.       To help prevent nausea and vomiting after your treatment, we encourage you to take your nausea medication as directed.  BELOW ARE SYMPTOMS THAT SHOULD BE REPORTED IMMEDIATELY: *FEVER GREATER THAN 100.4 F (38 C) OR HIGHER *CHILLS OR SWEATING *NAUSEA AND VOMITING THAT IS NOT CONTROLLED WITH YOUR NAUSEA MEDICATION *UNUSUAL SHORTNESS OF BREATH *UNUSUAL BRUISING OR BLEEDING *URINARY PROBLEMS (pain or burning when urinating, or frequent urination) *BOWEL PROBLEMS (unusual diarrhea, constipation, pain near the anus) TENDERNESS IN MOUTH AND THROAT WITH OR WITHOUT PRESENCE OF ULCERS (sore throat, sores in mouth, or a toothache) UNUSUAL RASH, SWELLING OR PAIN  UNUSUAL VAGINAL DISCHARGE OR ITCHING   Items with * indicate a potential emergency and should be followed up as soon as possible or go to the Emergency Department if any problems should occur.  Please show the CHEMOTHERAPY ALERT CARD or IMMUNOTHERAPY ALERT CARD at check-in to the Emergency  Department and triage nurse.  Should you have questions after your visit or need to cancel or reschedule your appointment, please contact [facility] [phone]  and follow the prompts.  Office hours are 8:00 a.m. to 4:30 p.m. Monday - Friday. Please note that voicemails left after 4:00 p.m. may not be returned until the following business day.  We are closed weekends and major holidays. You have access to a nurse at all times for urgent questions. Please call the main number to the clinic [phone] and follow the prompts.  For any non-urgent questions, you may also contact your provider using MyChart. We now offer e-Visits for anyone 18 and older to request care online for non-urgent symptoms. For details visit [email or web address].   Also download the MyChart app! Go to the app store, search "MyChart", open the app, select [facility], and log in with your MyChart username and password.  Due to Covid, a mask is required upon entering the hospital/clinic. If you do not have a mask, one will be given to you upon arrival. For doctor visits, patients may have 1 support person aged 18 or older with them. For treatment visits, patients cannot have anyone with them due to current Covid guidelines and our immunocompromised population.  

## 2021-09-01 NOTE — Progress Notes (Signed)
Pt presents today for Keytruda per provider's order.Labs and vitals WNL for treatment today.Okay to proceed with treatment today. Pt voiced no new complaints at this time.  Keytruda given today per MD orders. Tolerated infusion without adverse affects. Vital signs stable. No complaints at this time. Discharged from clinic ambulatory in stable condition. Alert and oriented x 3. F/U with [facility] as scheduled.

## 2021-09-22 ENCOUNTER — Other Ambulatory Visit: Payer: Self-pay

## 2021-09-22 ENCOUNTER — Inpatient Hospital Stay (HOSPITAL_COMMUNITY): Payer: Medicare Other | Attending: Hematology

## 2021-09-22 ENCOUNTER — Inpatient Hospital Stay (HOSPITAL_COMMUNITY): Payer: Medicare Other

## 2021-09-22 ENCOUNTER — Inpatient Hospital Stay (HOSPITAL_BASED_OUTPATIENT_CLINIC_OR_DEPARTMENT_OTHER): Payer: Medicare Other | Admitting: Hematology

## 2021-09-22 VITALS — BP 130/77 | HR 62 | Temp 97.9°F | Resp 18 | Ht 63.0 in | Wt 154.4 lb

## 2021-09-22 VITALS — BP 124/63 | HR 62 | Temp 97.9°F | Resp 16

## 2021-09-22 DIAGNOSIS — C7800 Secondary malignant neoplasm of unspecified lung: Secondary | ICD-10-CM | POA: Insufficient documentation

## 2021-09-22 DIAGNOSIS — C787 Secondary malignant neoplasm of liver and intrahepatic bile duct: Secondary | ICD-10-CM

## 2021-09-22 DIAGNOSIS — C184 Malignant neoplasm of transverse colon: Secondary | ICD-10-CM

## 2021-09-22 DIAGNOSIS — Z5112 Encounter for antineoplastic immunotherapy: Secondary | ICD-10-CM | POA: Diagnosis not present

## 2021-09-22 DIAGNOSIS — C189 Malignant neoplasm of colon, unspecified: Secondary | ICD-10-CM

## 2021-09-22 DIAGNOSIS — C770 Secondary and unspecified malignant neoplasm of lymph nodes of head, face and neck: Secondary | ICD-10-CM | POA: Diagnosis not present

## 2021-09-22 DIAGNOSIS — Z79899 Other long term (current) drug therapy: Secondary | ICD-10-CM | POA: Insufficient documentation

## 2021-09-22 LAB — CBC WITH DIFFERENTIAL/PLATELET
Abs Immature Granulocytes: 0.02 10*3/uL (ref 0.00–0.07)
Basophils Absolute: 0 10*3/uL (ref 0.0–0.1)
Basophils Relative: 1 %
Eosinophils Absolute: 0.2 10*3/uL (ref 0.0–0.5)
Eosinophils Relative: 3 %
HCT: 38.8 % (ref 36.0–46.0)
Hemoglobin: 12.5 g/dL (ref 12.0–15.0)
Immature Granulocytes: 0 %
Lymphocytes Relative: 22 %
Lymphs Abs: 1.4 10*3/uL (ref 0.7–4.0)
MCH: 30.8 pg (ref 26.0–34.0)
MCHC: 32.2 g/dL (ref 30.0–36.0)
MCV: 95.6 fL (ref 80.0–100.0)
Monocytes Absolute: 0.6 10*3/uL (ref 0.1–1.0)
Monocytes Relative: 10 %
Neutro Abs: 4 10*3/uL (ref 1.7–7.7)
Neutrophils Relative %: 64 %
Platelets: 222 10*3/uL (ref 150–400)
RBC: 4.06 MIL/uL (ref 3.87–5.11)
RDW: 13.1 % (ref 11.5–15.5)
WBC: 6.2 10*3/uL (ref 4.0–10.5)
nRBC: 0 % (ref 0.0–0.2)

## 2021-09-22 LAB — COMPREHENSIVE METABOLIC PANEL
ALT: 19 U/L (ref 0–44)
AST: 24 U/L (ref 15–41)
Albumin: 4.1 g/dL (ref 3.5–5.0)
Alkaline Phosphatase: 69 U/L (ref 38–126)
Anion gap: 11 (ref 5–15)
BUN: 11 mg/dL (ref 8–23)
CO2: 26 mmol/L (ref 22–32)
Calcium: 9.2 mg/dL (ref 8.9–10.3)
Chloride: 100 mmol/L (ref 98–111)
Creatinine, Ser: 0.62 mg/dL (ref 0.44–1.00)
GFR, Estimated: >60 mL/min (ref >60)
Glucose, Bld: 102 mg/dL — ABNORMAL HIGH (ref 70–99)
Potassium: 4 mmol/L (ref 3.5–5.1)
Sodium: 137 mmol/L (ref 135–145)
Total Bilirubin: 0.4 mg/dL (ref 0.3–1.2)
Total Protein: 8 g/dL (ref 6.5–8.1)

## 2021-09-22 LAB — TSH: TSH: 2.058 u[IU]/mL (ref 0.350–4.500)

## 2021-09-22 LAB — MAGNESIUM: Magnesium: 2.3 mg/dL (ref 1.7–2.4)

## 2021-09-22 MED ORDER — SODIUM CHLORIDE 0.9 % IV SOLN
200.0000 mg | Freq: Once | INTRAVENOUS | Status: AC
  Administered 2021-09-22: 200 mg via INTRAVENOUS
  Filled 2021-09-22: qty 8

## 2021-09-22 MED ORDER — SODIUM CHLORIDE 0.9 % IV SOLN: Freq: Once | INTRAVENOUS | Status: AC

## 2021-09-22 MED ORDER — SODIUM CHLORIDE 0.9% FLUSH
10.0000 mL | INTRAVENOUS | Status: DC | PRN
  Administered 2021-09-22: 10 mL

## 2021-09-22 MED ORDER — HEPARIN SOD (PORK) LOCK FLUSH 100 UNIT/ML IV SOLN
500.0000 [IU] | Freq: Once | INTRAVENOUS | Status: AC | PRN
  Administered 2021-09-22: 500 [IU]

## 2021-09-22 NOTE — Progress Notes (Signed)
Patient has been examined by Dr. Katragadda, and vital signs and labs have been reviewed. ANC, Creatinine, LFTs, hemoglobin, and platelets are within treatment parameters per M.D. - pt may proceed with treatment.    °

## 2021-09-22 NOTE — Progress Notes (Signed)
Patient tolerated therapy with no complaints voiced.  Side effects with management reviewed with understanding verbalized.  Port site clean and dry with no bruising or swelling noted at site.  Good blood return noted before and after administration of therapy.  Band aid applied.  Patient left in satisfactory condition with VSS and no s/s of distress noted.  

## 2021-09-22 NOTE — Progress Notes (Signed)
? ?[facility] ?[address] ?[place], [place] [address] ? ? ?CLINIC:  ?Medical Oncology/Hematology ? ?PCP:  ?Redmond School, MD ?[address] / [place] [address] ?[phone] ? ? ?REASON FOR VISIT:  ?Follow-up for transverse colon adenocarcinoma ? ?PRIOR THERAPY:  ?1. Right hemicolectomy on 12/29/2013. ?2. FOLFOX x 3 cycles from 02/10/2014 to 03/10/2014, stopped secondary to poor tolerance ? ?NGS Results: MSI high, MMR deficient, BRAF V600E, TMB high ? ?CURRENT THERAPY: Keytruda every 3 weeks ? ?BRIEF ONCOLOGIC HISTORY:  ?Oncology History  ?Adenocarcinoma of transverse colon ([place]) (Resolved)  ?12/09/2013 Initial Diagnosis  ? 1. Colon, biopsy, proximal transverse mass INVASIVE ADENOCARCINOMA. ?  ?12/29/2013 Definitive Surgery  ? Colon, segmental resection for tumor, right - INVASIVE COLORECTAL ADENOCARCINOMA, 7 CM, EXTENDING INTO PERICOLONIC CONNECTIVE TISSUE. - METASTATIC CARCINOMA IN 6 OF 33 LYMPH NODES (6/33). ?  ?02/10/2014 - 03/10/2014 Adjuvant Chemotherapy  ? FOLFOX x 3 cycles ?  ?03/11/2014 Adverse Reaction  ? Patient called reporting diarrhea despite dose reduction and she wants to stop therapy.  Patient seen on 03/24/14 to discuss other treatment options and she stands by her decision to stop all therapy. ?  ?04/01/2014 Surgery  ? Port-a-cath removal by Dr. Arnoldo Morale. ?  ?01/12/2015 PET scan  ? Mild abdominal mesenteric LAD show hypermetabolic activity, 7 mm indeterminate pulm nodule in sup segment of RLL shows no assoc metabolic activity ?  ?03/24/2015 PET scan  ? Mild progression of nodal mets in anterior mid abdominal mesentery, new nodal mets at L thoracic inlet. stable 6 mm nodule in posterior RLL, unchanged since 2015 ?  ?06/22/2015 PET scan  ? Resolution of metabolic activity and decreased size of mild LAD at L thoracic inlet, stable mild mid abd hypermetabolic mesenteric LAD, stable 6 mm posterior LLL pulm nodule without metabolic activity ?  ?12/22/2015 Imaging  ? MRI lumbar spine L4-L5 disc  degenerated, broad based disc hernation, B facet arthropathy with hypertophy and edema, stenosis of lateral recesses that could cause neural compression ?  ?10/18/2016 Imaging  ? CT CAP- 1. Several small ground-glass pulmonary nodules are present in the ?lungs and are stable over the past 9 months, but several are mildly ?larger than they were in 2015. Low-grade adenocarcinoma can ?sometimes have this appearance and surveillance is likely warranted. ?2. There is a cluster of nodal tissue in the central mesentery, ?maximum short axis diameter 1.3 cm. This nodal cluster is relatively ?low in density and not appreciably changed from the prior exam. ?Given the lack of change is may represent quiescent residua of ?malignancy, but again, surveillance is likely warranted. ?3. The left-sided rib fractures are more numerous than was revealed ?on the prior radiographs ; there are nondisplaced healing fractures ?of the left fifth, sixth, seventh, eighth, ninth, and tenth ribs. ?4. Mild cardiomegaly although part of this appearance may be due to ?pectus excavatum. ?5. Several additional small pulmonary nodules are stable from 2015 ?and considered benign. ?6. Right hemicolectomy and sigmoid colon diverticulosis. ?7. Small right paraumbilical hernia containing adipose tissue. ?8. Lower lumbar spondylosis and degenerative disc disease ?potentially with mild impingement at L4-5. ?  ?04/25/2017 Imaging  ? CT C/A/P: ?Scattered solid pulmonary nodules measure up to 6 mm in the right lower lobe, unchanged. There are a few scattered ground-glass nodules measuring up to 8 mm in the right upper lobe, also stable. Distal perigastric lymph nodes are stable. No additional evidence of metastatic disease. ?  ?Metastatic colon cancer to liver [facility])  ?11/04/2020 Initial Diagnosis  ?  Metastatic colon cancer to liver [facility]) ?  ?11/10/2020 - 12/10/2020 Chemotherapy  ?  ? ?  ? ?  ?01/13/2021 -  Chemotherapy  ? Patient is on Treatment Plan : COLORECTAL  Pembrolizumab q21d  ?   ? ? ?CANCER STAGING: ? Cancer Staging  ?No matching staging information was found for the patient. ? ?INTERVAL HISTORY:  ?[name], a 76 y.o. female, returns for routine follow-up and consideration for next cycle of chemotherapy. [name] was last seen on 08/11/2021. ? ?Due for cycle #13 of Keytruda today.  ? ?Overall, she tells me she has been feeling pretty well. She reports feeling depressed. She reports occasional itching and fatigue. She denies SOB and cough. She denies skin rash. Her appetite is good, and she denies nausea and vomiting. She denies burning sensation in her feet.  ? ?Overall, she feels ready for next cycle of chemo today.  ? ?REVIEW OF SYSTEMS:  ?Review of Systems  ?Constitutional:  Positive for fatigue. Negative for appetite change.  ?Respiratory:  Negative for cough and shortness of breath.   ?Gastrointestinal:  Positive for diarrhea. Negative for nausea and vomiting.  ?Skin:  Positive for itching. Negative for rash.  ?Psychiatric/Behavioral:  Positive for depression and sleep disturbance.   ?All other systems reviewed and are negative. ? ?PAST MEDICAL/SURGICAL HISTORY:  ?Past Medical History:  ?Diagnosis Date  ? Arthritis   ? wrist and thumbs  ? Colon cancer ([place])   ? PONV (postoperative nausea and vomiting)   ? Port-A-Cath in place 11/09/2020  ? Ruptured lumbar disc   ? L1-L5 per patient report  ? Sciatica of left side   ? ?Past Surgical History:  ?Procedure Laterality Date  ? COLONOSCOPY N/A 12/12/2013  ? Procedure: COLONOSCOPY;  Surgeon: Rogene Houston, MD;  [facility];  Service: Endoscopy;  Laterality: N/A;  730  ? COLONOSCOPY N/A 01/20/2015  ? Procedure: COLONOSCOPY;  Surgeon: Rogene Houston, MD;  [facility]: [facility];  Service: Endoscopy;  Laterality: N/A;  1030  ? COLONOSCOPY N/A 03/28/2018  ? Procedure: COLONOSCOPY;  Surgeon: Rogene Houston, MD;  [facility];  Service: Endoscopy;  Laterality: N/A;  1015  ? EUS N/A 12/18/2013  ?  Procedure: UPPER ENDOSCOPIC ULTRASOUND (EUS) LINEAR;  Surgeon: Milus Banister, MD;  Location: [facility];  Service: Endoscopy;  Laterality: N/A;  ? herniated disc    ? 1996-[id number]  ? IR IMAGING GUIDED PORT INSERTION  10/14/2020  ? IR US GUIDANCE  10/14/2020  ? PARTIAL COLECTOMY N/A 12/29/2013  ? Procedure: RIGHT HEMICOLECTOMY;  Surgeon: Jamesetta So, MD;  Location: [facility] ORS;  Service: General;  Laterality: N/A;  ? POLYPECTOMY  03/28/2018  ? Procedure: POLYPECTOMY;  Surgeon: Rogene Houston, MD;  Location[facility];  Service: Endoscopy;;  transverse colon (hot snare);  ? PORT-A-CATH REMOVAL Right 04/01/2014  ? Procedure: MINOR REMOVAL PORT-A-CATH;  Surgeon: Jamesetta So, MD;  Location: [facility];  Service: General;  Laterality: Right;  ? PORTACATH PLACEMENT Right 02/04/2014  ? Procedure: INSERTION PORT-A-CATH;  Surgeon: Jamesetta So, MD;  Location: [facility];  Service: General;  Laterality: Right;  ? ? ?SOCIAL HISTORY:  ?Social History  ? ?Socioeconomic History  ? Marital status: Married  ?  Spouse name: Not on file  ? Number of children: Not on file  ? Years of education: Not on file  ? Highest education level: Not on file  ?Occupational History  ? Not on file  ?Tobacco Use  ?  Smoking status: Former  ?  Packs/day: 0.25  ?  Years: 5.00  ?  Pack years: 1.25  ?  Types: Cigarettes  ?  Quit date: 04/19/1959  ?  Years since quitting: 62.4  ? Smokeless tobacco: Never  ? Tobacco comments:  ?  smoked for 5 years as teenager  ?Vaping Use  ? Vaping Use: Never used  ?Substance and Sexual Activity  ? Alcohol use: No  ? Drug use: No  ? Sexual activity: Yes  ?  Birth control/protection: Post-menopausal  ?Other Topics Concern  ? Not on file  ?Social History Narrative  ? Not on file  ? ?Social Determinants of Health  ? ?Financial Resource Strain: Low Risk   ? Difficulty of Paying Living Expenses: Not hard at all  ?Food Insecurity: No Food Insecurity  ? Worried About Charity fundraiser in the Last Year: Never true  ? Ran Out of Food  in the Last Year: Never true  ?Transportation Needs: No Transportation Needs  ? Lack of Transportation (Medical): No  ? Lack of Transportation (Non-Medical): No  ?Physical Activity: Inactive  ? Days of Exercise

## 2021-09-22 NOTE — Progress Notes (Signed)
Labs reviewed with MD today. Will proceed with treatment today.  ?

## 2021-09-22 NOTE — Patient Instructions (Signed)
[place] at [facility] ?Discharge Instructions ? ? ?You were seen and examined today by Dr. Delton Coombes. ? ?He reviewed your lab work which is normal/stable. ? ?We will proceed with your treatment today. ? ?Return as scheduled for lab work, office visit, and treatment.  ? ? ?Thank you for choosing [place] at [facility] to provide your oncology and hematology care.  To afford each patient quality time with our provider, please arrive at least 15 minutes before your scheduled appointment time.  ? ?If you have a lab appointment with the [facility] please come in thru the Main Entrance and check in at the main information desk. ? ?You need to re-schedule your appointment should you arrive 10 or more minutes late.  We strive to give you quality time with our providers, and arriving late affects you and other patients whose appointments are after yours.  Also, if you no show three or more times for appointments you may be dismissed from the clinic at the providers discretion.     ?Again, thank you for choosing [facility].  Our hope is that these requests will decrease the amount of time that you wait before being seen by our physicians.       ?_____________________________________________________________ ? ?Should you have questions after your visit to [facility], please contact our office at [phone] and follow the prompts.  Our office hours are 8:00 a.m. and 4:30 p.m. Monday - Friday.  Please note that voicemails left after 4:00 p.m. may not be returned until the following business day.  We are closed weekends and major holidays.  You do have access to a nurse 24-7, just call the main number to the clinic [phone] and do not press any options, hold on the line and a nurse will answer the phone.   ? ?For prescription refill requests, have your pharmacy contact our office and allow 72 hours.   ? ?Due to Covid, you will need to  wear a mask upon entering the hospital. If you do not have a mask, a mask will be given to you at the Main Entrance upon arrival. For doctor visits, patients may have 1 support person age 7 or older with them. For treatment visits, patients can not have anyone with them due to social distancing guidelines and our immunocompromised population.  ? ?   ?

## 2021-09-22 NOTE — Patient Instructions (Signed)
[facility]  Discharge Instructions: Thank you for choosing [facility] to provide your oncology and hematology care.  If you have a lab appointment with the Cancer Center, please come in thru the Main Entrance and check in at the main information desk.  Wear comfortable clothing and clothing appropriate for easy access to any Portacath or PICC line.   We strive to give you quality time with your provider. You may need to reschedule your appointment if you arrive late (15 or more minutes).  Arriving late affects you and other patients whose appointments are after yours.  Also, if you miss three or more appointments without notifying the office, you may be dismissed from the clinic at the provider's discretion.      For prescription refill requests, have your pharmacy contact our office and allow 72 hours for refills to be completed.    Today you received the following chemotherapy and/or immunotherapy agents keytruda.       To help prevent nausea and vomiting after your treatment, we encourage you to take your nausea medication as directed.  BELOW ARE SYMPTOMS THAT SHOULD BE REPORTED IMMEDIATELY: *FEVER GREATER THAN 100.4 F (38 C) OR HIGHER *CHILLS OR SWEATING *NAUSEA AND VOMITING THAT IS NOT CONTROLLED WITH YOUR NAUSEA MEDICATION *UNUSUAL SHORTNESS OF BREATH *UNUSUAL BRUISING OR BLEEDING *URINARY PROBLEMS (pain or burning when urinating, or frequent urination) *BOWEL PROBLEMS (unusual diarrhea, constipation, pain near the anus) TENDERNESS IN MOUTH AND THROAT WITH OR WITHOUT PRESENCE OF ULCERS (sore throat, sores in mouth, or a toothache) UNUSUAL RASH, SWELLING OR PAIN  UNUSUAL VAGINAL DISCHARGE OR ITCHING   Items with * indicate a potential emergency and should be followed up as soon as possible or go to the Emergency Department if any problems should occur.  Please show the CHEMOTHERAPY ALERT CARD or IMMUNOTHERAPY ALERT CARD at check-in to the Emergency  Department and triage nurse.  Should you have questions after your visit or need to cancel or reschedule your appointment, please contact  CANCER CENTER [phone]  and follow the prompts.  Office hours are 8:00 a.m. to 4:30 p.m. Monday - Friday. Please note that voicemails left after 4:00 p.m. may not be returned until the following business day.  We are closed weekends and major holidays. You have access to a nurse at all times for urgent questions. Please call the main number to the clinic [phone] and follow the prompts.  For any non-urgent questions, you may also contact your provider using MyChart. We now offer e-Visits for anyone 18 and older to request care online for non-urgent symptoms. For details visit [email or web address].   Also download the MyChart app! Go to the app store, search "MyChart", open the app, select [facility], and log in with your MyChart username and password.  Due to Covid, a mask is required upon entering the hospital/clinic. If you do not have a mask, one will be given to you upon arrival. For doctor visits, patients may have 1 support person aged 18 or older with them. For treatment visits, patients cannot have anyone with them due to current Covid guidelines and our immunocompromised population.  

## 2021-09-23 LAB — CEA: CEA: 5.2 ng/mL — ABNORMAL HIGH (ref 0.0–4.7)

## 2021-09-28 ENCOUNTER — Other Ambulatory Visit: Payer: Self-pay

## 2021-09-28 ENCOUNTER — Ambulatory Visit (HOSPITAL_COMMUNITY)
Admission: RE | Admit: 2021-09-28 | Discharge: 2021-09-28 | Disposition: A | Discharge status: Home / Self Care | Payer: Medicare Other | Source: Ambulatory Visit | Attending: Hematology | Admitting: Hematology

## 2021-09-28 ENCOUNTER — Encounter (HOSPITAL_COMMUNITY): Payer: Self-pay | Admitting: Radiology

## 2021-09-28 DIAGNOSIS — C189 Malignant neoplasm of colon, unspecified: Secondary | ICD-10-CM | POA: Diagnosis not present

## 2021-09-28 DIAGNOSIS — C787 Secondary malignant neoplasm of liver and intrahepatic bile duct: Secondary | ICD-10-CM | POA: Diagnosis not present

## 2021-09-28 DIAGNOSIS — R918 Other nonspecific abnormal finding of lung field: Secondary | ICD-10-CM | POA: Diagnosis not present

## 2021-09-28 DIAGNOSIS — K5730 Diverticulosis of large intestine without perforation or abscess without bleeding: Secondary | ICD-10-CM | POA: Diagnosis not present

## 2021-09-28 DIAGNOSIS — K769 Liver disease, unspecified: Secondary | ICD-10-CM | POA: Diagnosis not present

## 2021-09-28 MED ORDER — IOHEXOL 300 MG/ML  SOLN
100.0000 mL | Freq: Once | INTRAMUSCULAR | Status: AC | PRN
  Administered 2021-09-28: 100 mL via INTRAVENOUS

## 2021-10-13 ENCOUNTER — Encounter (HOSPITAL_COMMUNITY): Payer: Self-pay

## 2021-10-13 ENCOUNTER — Inpatient Hospital Stay (HOSPITAL_COMMUNITY): Payer: Medicare Other | Attending: Hematology

## 2021-10-13 ENCOUNTER — Inpatient Hospital Stay (HOSPITAL_COMMUNITY): Payer: Medicare Other

## 2021-10-13 VITALS — BP 114/69 | HR 61 | Temp 98.4°F | Resp 18 | Ht 63.0 in | Wt 157.5 lb

## 2021-10-13 DIAGNOSIS — C787 Secondary malignant neoplasm of liver and intrahepatic bile duct: Secondary | ICD-10-CM | POA: Insufficient documentation

## 2021-10-13 DIAGNOSIS — C189 Malignant neoplasm of colon, unspecified: Secondary | ICD-10-CM

## 2021-10-13 DIAGNOSIS — Z79899 Other long term (current) drug therapy: Secondary | ICD-10-CM | POA: Diagnosis not present

## 2021-10-13 DIAGNOSIS — C7800 Secondary malignant neoplasm of unspecified lung: Secondary | ICD-10-CM | POA: Insufficient documentation

## 2021-10-13 DIAGNOSIS — C778 Secondary and unspecified malignant neoplasm of lymph nodes of multiple regions: Secondary | ICD-10-CM | POA: Insufficient documentation

## 2021-10-13 DIAGNOSIS — C184 Malignant neoplasm of transverse colon: Secondary | ICD-10-CM | POA: Diagnosis not present

## 2021-10-13 DIAGNOSIS — Z95828 Presence of other vascular implants and grafts: Secondary | ICD-10-CM

## 2021-10-13 DIAGNOSIS — Z5112 Encounter for antineoplastic immunotherapy: Secondary | ICD-10-CM | POA: Insufficient documentation

## 2021-10-13 LAB — CBC WITH DIFFERENTIAL/PLATELET
Abs Immature Granulocytes: 0.03 10*3/uL (ref 0.00–0.07)
Basophils Absolute: 0.1 10*3/uL (ref 0.0–0.1)
Basophils Relative: 1 %
Eosinophils Absolute: 0.2 10*3/uL (ref 0.0–0.5)
Eosinophils Relative: 3 %
HCT: 39.9 % (ref 36.0–46.0)
Hemoglobin: 12.9 g/dL (ref 12.0–15.0)
Immature Granulocytes: 1 %
Lymphocytes Relative: 24 %
Lymphs Abs: 1.6 10*3/uL (ref 0.7–4.0)
MCH: 30.4 pg (ref 26.0–34.0)
MCHC: 32.3 g/dL (ref 30.0–36.0)
MCV: 93.9 fL (ref 80.0–100.0)
Monocytes Absolute: 0.6 10*3/uL (ref 0.1–1.0)
Monocytes Relative: 9 %
Neutro Abs: 4.2 10*3/uL (ref 1.7–7.7)
Neutrophils Relative %: 62 %
Platelets: 209 10*3/uL (ref 150–400)
RBC: 4.25 MIL/uL (ref 3.87–5.11)
RDW: 13.2 % (ref 11.5–15.5)
WBC: 6.6 10*3/uL (ref 4.0–10.5)
nRBC: 0 % (ref 0.0–0.2)

## 2021-10-13 LAB — COMPREHENSIVE METABOLIC PANEL
ALT: 18 U/L (ref 0–44)
AST: 22 U/L (ref 15–41)
Albumin: 4.2 g/dL (ref 3.5–5.0)
Alkaline Phosphatase: 70 U/L (ref 38–126)
Anion gap: 7 (ref 5–15)
BUN: 12 mg/dL (ref 8–23)
CO2: 27 mmol/L (ref 22–32)
Calcium: 9.6 mg/dL (ref 8.9–10.3)
Chloride: 105 mmol/L (ref 98–111)
Creatinine, Ser: 0.68 mg/dL (ref 0.44–1.00)
GFR, Estimated: >60 mL/min (ref >60)
Glucose, Bld: 101 mg/dL — ABNORMAL HIGH (ref 70–99)
Potassium: 4.8 mmol/L (ref 3.5–5.1)
Sodium: 139 mmol/L (ref 135–145)
Total Bilirubin: 0.6 mg/dL (ref 0.3–1.2)
Total Protein: 8.1 g/dL (ref 6.5–8.1)

## 2021-10-13 LAB — MAGNESIUM: Magnesium: 2.3 mg/dL (ref 1.7–2.4)

## 2021-10-13 MED ORDER — HEPARIN SOD (PORK) LOCK FLUSH 100 UNIT/ML IV SOLN
500.0000 [IU] | Freq: Once | INTRAVENOUS | Status: AC | PRN
  Administered 2021-10-13: 500 [IU]

## 2021-10-13 MED ORDER — SODIUM CHLORIDE 0.9 % IV SOLN
200.0000 mg | Freq: Once | INTRAVENOUS | Status: AC
  Administered 2021-10-13: 200 mg via INTRAVENOUS
  Filled 2021-10-13: qty 8

## 2021-10-13 MED ORDER — SODIUM CHLORIDE 0.9% FLUSH
10.0000 mL | INTRAVENOUS | Status: DC | PRN
  Administered 2021-10-13: 10 mL

## 2021-10-13 MED ORDER — SODIUM CHLORIDE 0.9 % IV SOLN: Freq: Once | INTRAVENOUS | Status: AC

## 2021-10-13 NOTE — Patient Instructions (Signed)
[facility]  Discharge Instructions: Thank you for choosing [facility] to provide your oncology and hematology care.  If you have a lab appointment with the Cancer Center, please come in thru the Main Entrance and check in at the main information desk.  Wear comfortable clothing and clothing appropriate for easy access to any Portacath or PICC line.   We strive to give you quality time with your provider. You may need to reschedule your appointment if you arrive late (15 or more minutes).  Arriving late affects you and other patients whose appointments are after yours.  Also, if you miss three or more appointments without notifying the office, you may be dismissed from the clinic at the provider's discretion.      For prescription refill requests, have your pharmacy contact our office and allow 72 hours for refills to be completed.    Today you received the following chemotherapy and/or immunotherapy agents Keytruda       To help prevent nausea and vomiting after your treatment, we encourage you to take your nausea medication as directed.  BELOW ARE SYMPTOMS THAT SHOULD BE REPORTED IMMEDIATELY: *FEVER GREATER THAN 100.4 F (38 C) OR HIGHER *CHILLS OR SWEATING *NAUSEA AND VOMITING THAT IS NOT CONTROLLED WITH YOUR NAUSEA MEDICATION *UNUSUAL SHORTNESS OF BREATH *UNUSUAL BRUISING OR BLEEDING *URINARY PROBLEMS (pain or burning when urinating, or frequent urination) *BOWEL PROBLEMS (unusual diarrhea, constipation, pain near the anus) TENDERNESS IN MOUTH AND THROAT WITH OR WITHOUT PRESENCE OF ULCERS (sore throat, sores in mouth, or a toothache) UNUSUAL RASH, SWELLING OR PAIN  UNUSUAL VAGINAL DISCHARGE OR ITCHING   Items with * indicate a potential emergency and should be followed up as soon as possible or go to the Emergency Department if any problems should occur.  Please show the CHEMOTHERAPY ALERT CARD or IMMUNOTHERAPY ALERT CARD at check-in to the Emergency  Department and triage nurse.  Should you have questions after your visit or need to cancel or reschedule your appointment, please contact [facility] [phone]  and follow the prompts.  Office hours are 8:00 a.m. to 4:30 p.m. Monday - Friday. Please note that voicemails left after 4:00 p.m. may not be returned until the following business day.  We are closed weekends and major holidays. You have access to a nurse at all times for urgent questions. Please call the main number to the clinic [phone] and follow the prompts.  For any non-urgent questions, you may also contact your provider using MyChart. We now offer e-Visits for anyone 18 and older to request care online for non-urgent symptoms. For details visit [email or web address].   Also download the MyChart app! Go to the app store, search "MyChart", open the app, select [facility], and log in with your MyChart username and password.  Due to Covid, a mask is required upon entering the hospital/clinic. If you do not have a mask, one will be given to you upon arrival. For doctor visits, patients may have 1 support person aged 18 or older with them. For treatment visits, patients cannot have anyone with them due to current Covid guidelines and our immunocompromised population.  

## 2021-10-13 NOTE — Progress Notes (Signed)
Patient presents today for Keytruda. Vital signs within parameters for today's treatment. Patient has no complaints of any side effects related to her last treatment. MAR reviewed. Labs within parameters for treatment.  ? ?Treatment given today per MD orders. Tolerated infusion without adverse affects. Vital signs stable. No complaints at this time. Discharged from clinic ambulatory in stable condition. Alert and oriented x 3. F/U with [facility] as scheduled.   ?

## 2021-10-19 ENCOUNTER — Other Ambulatory Visit (HOSPITAL_COMMUNITY): Payer: Medicare Other | Admitting: Licensed Clinical Social Worker

## 2021-11-03 ENCOUNTER — Inpatient Hospital Stay (HOSPITAL_BASED_OUTPATIENT_CLINIC_OR_DEPARTMENT_OTHER): Payer: Medicare Other | Admitting: Hematology

## 2021-11-03 ENCOUNTER — Inpatient Hospital Stay (HOSPITAL_COMMUNITY): Payer: Medicare Other

## 2021-11-03 VITALS — BP 131/72 | HR 60 | Temp 98.1°F | Resp 18

## 2021-11-03 VITALS — BP 127/70 | HR 77 | Temp 98.6°F | Resp 18 | Ht 63.0 in | Wt 157.3 lb

## 2021-11-03 DIAGNOSIS — C787 Secondary malignant neoplasm of liver and intrahepatic bile duct: Secondary | ICD-10-CM

## 2021-11-03 DIAGNOSIS — C189 Malignant neoplasm of colon, unspecified: Secondary | ICD-10-CM

## 2021-11-03 DIAGNOSIS — C184 Malignant neoplasm of transverse colon: Secondary | ICD-10-CM

## 2021-11-03 DIAGNOSIS — C778 Secondary and unspecified malignant neoplasm of lymph nodes of multiple regions: Secondary | ICD-10-CM | POA: Diagnosis not present

## 2021-11-03 DIAGNOSIS — Z95828 Presence of other vascular implants and grafts: Secondary | ICD-10-CM

## 2021-11-03 DIAGNOSIS — Z79899 Other long term (current) drug therapy: Secondary | ICD-10-CM | POA: Diagnosis not present

## 2021-11-03 DIAGNOSIS — Z5112 Encounter for antineoplastic immunotherapy: Secondary | ICD-10-CM | POA: Diagnosis not present

## 2021-11-03 DIAGNOSIS — E064 Drug-induced thyroiditis: Secondary | ICD-10-CM

## 2021-11-03 DIAGNOSIS — C7800 Secondary malignant neoplasm of unspecified lung: Secondary | ICD-10-CM | POA: Diagnosis not present

## 2021-11-03 LAB — COMPREHENSIVE METABOLIC PANEL
ALT: 16 U/L (ref 0–44)
AST: 23 U/L (ref 15–41)
Albumin: 4.5 g/dL (ref 3.5–5.0)
Alkaline Phosphatase: 66 U/L (ref 38–126)
Anion gap: 7 (ref 5–15)
BUN: 14 mg/dL (ref 8–23)
CO2: 27 mmol/L (ref 22–32)
Calcium: 9.5 mg/dL (ref 8.9–10.3)
Chloride: 105 mmol/L (ref 98–111)
Creatinine, Ser: 0.64 mg/dL (ref 0.44–1.00)
GFR, Estimated: >60 mL/min (ref >60)
Glucose, Bld: 96 mg/dL (ref 70–99)
Potassium: 4 mmol/L (ref 3.5–5.1)
Sodium: 139 mmol/L (ref 135–145)
Total Bilirubin: 0.6 mg/dL (ref 0.3–1.2)
Total Protein: 8.2 g/dL — ABNORMAL HIGH (ref 6.5–8.1)

## 2021-11-03 LAB — CBC WITH DIFFERENTIAL/PLATELET
Abs Immature Granulocytes: 0.02 10*3/uL (ref 0.00–0.07)
Basophils Absolute: 0 10*3/uL (ref 0.0–0.1)
Basophils Relative: 1 %
Eosinophils Absolute: 0.2 10*3/uL (ref 0.0–0.5)
Eosinophils Relative: 2 %
HCT: 41.1 % (ref 36.0–46.0)
Hemoglobin: 13.2 g/dL (ref 12.0–15.0)
Immature Granulocytes: 0 %
Lymphocytes Relative: 24 %
Lymphs Abs: 1.6 10*3/uL (ref 0.7–4.0)
MCH: 30.1 pg (ref 26.0–34.0)
MCHC: 32.1 g/dL (ref 30.0–36.0)
MCV: 93.6 fL (ref 80.0–100.0)
Monocytes Absolute: 0.6 10*3/uL (ref 0.1–1.0)
Monocytes Relative: 9 %
Neutro Abs: 4.2 10*3/uL (ref 1.7–7.7)
Neutrophils Relative %: 64 %
Platelets: 193 10*3/uL (ref 150–400)
RBC: 4.39 MIL/uL (ref 3.87–5.11)
RDW: 13.2 % (ref 11.5–15.5)
WBC: 6.6 10*3/uL (ref 4.0–10.5)
nRBC: 0 % (ref 0.0–0.2)

## 2021-11-03 LAB — MAGNESIUM: Magnesium: 2.3 mg/dL (ref 1.7–2.4)

## 2021-11-03 LAB — TSH: TSH: 2.432 u[IU]/mL (ref 0.350–4.500)

## 2021-11-03 MED ORDER — SODIUM CHLORIDE 0.9% FLUSH
10.0000 mL | INTRAVENOUS | Status: DC | PRN
  Administered 2021-11-03: 10 mL

## 2021-11-03 MED ORDER — SODIUM CHLORIDE 0.9 % IV SOLN: Freq: Once | INTRAVENOUS | Status: AC

## 2021-11-03 MED ORDER — SODIUM CHLORIDE 0.9 % IV SOLN
200.0000 mg | Freq: Once | INTRAVENOUS | Status: AC
  Administered 2021-11-03: 200 mg via INTRAVENOUS
  Filled 2021-11-03: qty 8

## 2021-11-03 MED ORDER — HEPARIN SOD (PORK) LOCK FLUSH 100 UNIT/ML IV SOLN
500.0000 [IU] | Freq: Once | INTRAVENOUS | Status: AC | PRN
  Administered 2021-11-03: 500 [IU]

## 2021-11-03 NOTE — Patient Instructions (Signed)
[place] at [facility] ?Discharge Instructions ? ? ?You were seen and examined today by Dr. Delton Coombes. ? ?He reviewed the results of your lab work which are normal. ? ?He reviewed the results of your CT scan which is stable. ? ?We will proceed with your Keytruda infusion today. ? ?Return as scheduled.  ? ? ?Thank you for choosing [place] at [facility] to provide your oncology and hematology care.  To afford each patient quality time with our provider, please arrive at least 15 minutes before your scheduled appointment time.  ? ?If you have a lab appointment with the [facility] please come in thru the Main Entrance and check in at the main information desk. ? ?You need to re-schedule your appointment should you arrive 10 or more minutes late.  We strive to give you quality time with our providers, and arriving late affects you and other patients whose appointments are after yours.  Also, if you no show three or more times for appointments you may be dismissed from the clinic at the providers discretion.     ?Again, thank you for choosing [facility].  Our hope is that these requests will decrease the amount of time that you wait before being seen by our physicians.       ?_____________________________________________________________ ? ?Should you have questions after your visit to [facility], please contact our office at [phone] and follow the prompts.  Our office hours are 8:00 a.m. and 4:30 p.m. Monday - Friday.  Please note that voicemails left after 4:00 p.m. may not be returned until the following business day.  We are closed weekends and major holidays.  You do have access to a nurse 24-7, just call the main number to the clinic [phone] and do not press any options, hold on the line and a nurse will answer the phone.   ? ?For prescription refill requests, have your pharmacy contact our office and allow 72 hours.    ? ?Due to Covid, you will need to wear a mask upon entering the hospital. If you do not have a mask, a mask will be given to you at the Main Entrance upon arrival. For doctor visits, patients may have 1 support person age 57 or older with them. For treatment visits, patients can not have anyone with them due to social distancing guidelines and our immunocompromised population.  ? ?   ?

## 2021-11-03 NOTE — Progress Notes (Signed)
Patient tolerated therapy with no complaints voiced.  Side effects with management reviewed with understanding verbalized.  Port site clean and dry with no bruising or swelling noted at site.  Good blood return noted before and after administration of therapy.  Band aid applied.  Patient left in satisfactory condition with VSS and no s/s of distress noted.  

## 2021-11-03 NOTE — Patient Instructions (Signed)
[facility]  Discharge Instructions: ?Thank you for choosing [facility] to provide your oncology and hematology care.  ?If you have a lab appointment with the Cancer Center, please come in thru the Main Entrance and check in at the main information desk. ? ?Wear comfortable clothing and clothing appropriate for easy access to any Portacath or PICC line.  ? ?We strive to give you quality time with your provider. You may need to reschedule your appointment if you arrive late (15 or more minutes).  Arriving late affects you and other patients whose appointments are after yours.  Also, if you miss three or more appointments without notifying the office, you may be dismissed from the clinic at the provider?s discretion.    ?  ?For prescription refill requests, have your pharmacy contact our office and allow 72 hours for refills to be completed.   ? ?Today you received the following chemotherapy and/or immunotherapy agents Keytruda, return as scheduled.  ?  ?To help prevent nausea and vomiting after your treatment, we encourage you to take your nausea medication as directed. ? ?BELOW ARE SYMPTOMS THAT SHOULD BE REPORTED IMMEDIATELY: ?*FEVER GREATER THAN 100.4 F (38 ?C) OR HIGHER ?*CHILLS OR SWEATING ?*NAUSEA AND VOMITING THAT IS NOT CONTROLLED WITH YOUR NAUSEA MEDICATION ?*UNUSUAL SHORTNESS OF BREATH ?*UNUSUAL BRUISING OR BLEEDING ?*URINARY PROBLEMS (pain or burning when urinating, or frequent urination) ?*BOWEL PROBLEMS (unusual diarrhea, constipation, pain near the anus) ?TENDERNESS IN MOUTH AND THROAT WITH OR WITHOUT PRESENCE OF ULCERS (sore throat, sores in mouth, or a toothache) ?UNUSUAL RASH, SWELLING OR PAIN  ?UNUSUAL VAGINAL DISCHARGE OR ITCHING  ? ?Items with * indicate a potential emergency and should be followed up as soon as possible or go to the Emergency Department if any problems should occur. ? ?Please show the CHEMOTHERAPY ALERT CARD or IMMUNOTHERAPY ALERT CARD at check-in to  the Emergency Department and triage nurse. ? ?Should you have questions after your visit or need to cancel or reschedule your appointment, please contact [facility] [phone]  and follow the prompts.  Office hours are 8:00 a.m. to 4:30 p.m. Monday - Friday. Please note that voicemails left after 4:00 p.m. may not be returned until the following business day.  We are closed weekends and major holidays. You have access to a nurse at all times for urgent questions. Please call the main number to the clinic [phone] and follow the prompts. ? ?For any non-urgent questions, you may also contact your provider using MyChart. We now offer e-Visits for anyone 18 and older to request care online for non-urgent symptoms. For details visit [email or web address]. ?  ?Also download the MyChart app! Go to the app store, search "MyChart", open the app, select [facility], and log in with your MyChart username and password. ? ?Due to Covid, a mask is required upon entering the hospital/clinic. If you do not have a mask, one will be given to you upon arrival. For doctor visits, patients may have 1 support person aged 18 or older with them. For treatment visits, patients cannot have anyone with them due to current Covid guidelines and our immunocompromised population.  ?

## 2021-11-03 NOTE — Progress Notes (Signed)
? ?[facility] ?[address] ?[place], [place] [id number] ? ? ?CLINIC:  ?Medical Oncology/Hematology ? ?PCP:  ?Redmond School, MD ?[address] / [place] [address] ?[phone] ? ? ?REASON FOR VISIT:  ?Follow-up for transverse colon adenocarcinoma ? ?PRIOR THERAPY:  ?1. Right hemicolectomy on 12/29/2013. ?2. FOLFOX x 3 cycles from 02/10/2014 to 03/10/2014, stopped secondary to poor tolerance ? ?NGS Results: MSI high, MMR deficient, BRAF V600E, TMB high ? ?CURRENT THERAPY: Keytruda every 3 weeks ? ?BRIEF ONCOLOGIC HISTORY:  ?Oncology History  ?Adenocarcinoma of transverse colon ([place]) (Resolved)  ?12/09/2013 Initial Diagnosis  ? 1. Colon, biopsy, proximal transverse mass INVASIVE ADENOCARCINOMA. ? ?  ?12/29/2013 Definitive Surgery  ? Colon, segmental resection for tumor, right - INVASIVE COLORECTAL ADENOCARCINOMA, 7 CM, EXTENDING INTO PERICOLONIC CONNECTIVE TISSUE. - METASTATIC CARCINOMA IN 6 OF 33 LYMPH NODES (6/33). ? ?  ?02/10/2014 - 03/10/2014 Adjuvant Chemotherapy  ? FOLFOX x 3 cycles ? ?  ?03/11/2014 Adverse Reaction  ? Patient called reporting diarrhea despite dose reduction and she wants to stop therapy.  Patient seen on 03/24/14 to discuss other treatment options and she stands by her decision to stop all therapy. ? ?  ?04/01/2014 Surgery  ? Port-a-cath removal by Dr. Arnoldo Morale. ? ?  ?01/12/2015 PET scan  ? Mild abdominal mesenteric LAD show hypermetabolic activity, 7 mm indeterminate pulm nodule in sup segment of RLL shows no assoc metabolic activity ? ?  ?03/24/2015 PET scan  ? Mild progression of nodal mets in anterior mid abdominal mesentery, new nodal mets at L thoracic inlet. stable 6 mm nodule in posterior RLL, unchanged since 2015 ? ?  ?06/22/2015 PET scan  ? Resolution of metabolic activity and decreased size of mild LAD at L thoracic inlet, stable mild mid abd hypermetabolic mesenteric LAD, stable 6 mm posterior LLL pulm nodule without metabolic activity ? ?  ?2/68/3419 Imaging  ? MRI lumbar  spine L4-L5 disc degenerated, broad based disc hernation, B facet arthropathy with hypertophy and edema, stenosis of lateral recesses that could cause neural compression ? ?  ?10/18/2016 Imaging  ? CT CAP- 1. Several small ground-glass pulmonary nodules are present in the ?lungs and are stable over the past 9 months, but several are mildly ?larger than they were in 2015. Low-grade adenocarcinoma can ?sometimes have this appearance and surveillance is likely warranted. ?2. There is a cluster of nodal tissue in the central mesentery, ?maximum short axis diameter 1.3 cm. This nodal cluster is relatively ?low in density and not appreciably changed from the prior exam. ?Given the lack of change is may represent quiescent residua of ?malignancy, but again, surveillance is likely warranted. ?3. The left-sided rib fractures are more numerous than was revealed ?on the prior radiographs ; there are nondisplaced healing fractures ?of the left fifth, sixth, seventh, eighth, ninth, and tenth ribs. ?4. Mild cardiomegaly although part of this appearance may be due to ?pectus excavatum. ?5. Several additional small pulmonary nodules are stable from 2015 ?and considered benign. ?6. Right hemicolectomy and sigmoid colon diverticulosis. ?7. Small right paraumbilical hernia containing adipose tissue. ?8. Lower lumbar spondylosis and degenerative disc disease ?potentially with mild impingement at L4-5. ? ?  ?04/25/2017 Imaging  ? CT C/A/P: ?Scattered solid pulmonary nodules measure up to 6 mm in the right lower lobe, unchanged. There are a few scattered ground-glass nodules measuring up to 8 mm in the right upper lobe, also stable. Distal perigastric lymph nodes are stable. No additional evidence of metastatic disease. ?  ?Metastatic  colon cancer to liver [facility])  ?11/04/2020 Initial Diagnosis  ? Metastatic colon cancer to liver [facility]) ? ?  ?11/10/2020 - 12/10/2020 Chemotherapy  ?  ? ?  ? ?  ?01/13/2021 -  Chemotherapy  ? Patient is on Treatment  Plan : COLORECTAL Pembrolizumab q21d  ? ?  ?  ? ? ?CANCER STAGING: ? Cancer Staging  ?No matching staging information was found for the patient. ? ?INTERVAL HISTORY:  ?[name], a 76 y.o. female, returns for routine follow-up and consideration for next cycle of chemotherapy. [place] was last seen on 09/22/2021. ? ?Due for cycle #15 of Keytruda today.  ? ?Overall, she tells me she has been feeling pretty well. She reports mild fatigue for 3 days following treatment. Her diarrhea is stable. She denies nausea, vomiting, and abdominal pain. Her appetite is good, and she has gained 3 lbs since her last visit. She reports occasional itching patches of skin which are resolved with regular lotion applications. She continues to take 1 whole tablet of Imodium daily.  ? ?Overall, she feels ready for next cycle of chemo today.  ? ?REVIEW OF SYSTEMS:  ?Review of Systems  ?Constitutional:  Negative for appetite change, fatigue and unexpected weight change (+3 lbs).  ?Gastrointestinal:  Positive for diarrhea. Negative for abdominal pain, nausea and vomiting.  ?Skin:  Positive for itching.  ?Psychiatric/Behavioral:  Positive for depression. The patient is nervous/anxious.   ?All other systems reviewed and are negative. ? ?PAST MEDICAL/SURGICAL HISTORY:  ?Past Medical History:  ?Diagnosis Date  ? Arthritis   ? wrist and thumbs  ? Colon cancer ([place])   ? PONV (postoperative nausea and vomiting)   ? Port-A-Cath in place 11/09/2020  ? Ruptured lumbar disc   ? L1-L5 per patient report  ? Sciatica of left side   ? ?Past Surgical History:  ?Procedure Laterality Date  ? COLONOSCOPY N/A 12/12/2013  ? Procedure: COLONOSCOPY;  Surgeon: Rogene [facility], MD;  [facility];  Service: Endoscopy;  Laterality: N/A;  730  ? COLONOSCOPY N/A 01/20/2015  ? Procedure: COLONOSCOPY;  Surgeon: Rogene [facility], MD;  Location: [facility];  Service: Endoscopy;  Laterality: N/A;  1030  ? COLONOSCOPY N/A 03/28/2018  ? Procedure: COLONOSCOPY;   Surgeon: Rogene [facility], MD;  Location: [facility];  Service: Endoscopy;  Laterality: N/A;  1015  ? EUS N/A 12/18/2013  ? Procedure: UPPER ENDOSCOPIC ULTRASOUND (EUS) LINEAR;  Surgeon: Milus Banister, MD;  Location: [facility];  Service: Endoscopy;  Laterality: N/A;  ? herniated disc    ? 1996-c5-c7  ? IR IMAGING GUIDED PORT INSERTION  10/14/2020  ? IR US GUIDANCE  10/14/2020  ? PARTIAL COLECTOMY N/A 12/29/2013  ? Procedure: RIGHT HEMICOLECTOMY;  Surgeon: Jamesetta So, MD;  Location: [facility] ORS;  Service: General;  Laterality: N/A;  ? POLYPECTOMY  03/28/2018  ? Procedure: POLYPECTOMY;  Surgeon: Rogene [facility], MD;  [facility];  Service: Endoscopy;;  transverse colon (hot snare);  ? PORT-A-CATH REMOVAL Right 04/01/2014  ? Procedure: MINOR REMOVAL PORT-A-CATH;  Surgeon: Jamesetta So, MD;  Location: [facility];  Service: General;  Laterality: Right;  ? PORTACATH PLACEMENT Right 02/04/2014  ? Procedure: INSERTION PORT-A-CATH;  Surgeon: Jamesetta So, MD;  Location: [facility];  Service: General;  Laterality: Right;  ? ? ?SOCIAL HISTORY:  ?Social History  ? ?Socioeconomic History  ? Marital status: Married  ?  Spouse name: Not on file  ? Number of children: Not  on file  ? Years of education: Not on file  ? Highest education level: Not on file  ?Occupational History  ? Not on file  ?Tobacco Use  ? Smoking status: Former  ?  Packs/day: 0.25  ?  Years: 5.00  ?  Pack years: 1.25  ?  Types: Cigarettes  ?  Quit date: 04/19/1959  ?  Years since quitting: 62.5  ? Smokeless tobacco: Never  ? Tobacco comments:  ?  smoked for 5 years as teenager  ?Vaping Use  ? Vaping Use: Never used  ?Substance and Sexual Activity  ? Alcohol use: No  ? Drug use: No  ? Sexual activity: Yes  ?  Birth control/protection: Post-menopausal  ?Other Topics Concern  ? Not on file  ?Social History Narrative  ? Not on file  ? ?Social Determinants of Health  ? ?Financial Resource Strain: Not on file  ?Food Insecurity: Not on file  ?Transportation  Needs: Not on file  ?Physical Activity: Not on file  ?Stress: Not on file  ?Social Connections: Not on file  ?Intimate Partner Violence: Not on file  ? ? ?FAMILY HISTORY:  ?Family History  ?Problem Relation Age of

## 2021-11-03 NOTE — Progress Notes (Signed)
Patient has been examined by Dr. Katragadda, and vital signs and labs have been reviewed. ANC, Creatinine, LFTs, hemoglobin, and platelets are within treatment parameters per M.D. - pt may proceed with treatment.    °

## 2021-11-04 LAB — CEA: CEA: 5.4 ng/mL — ABNORMAL HIGH (ref 0.0–4.7)

## 2021-11-24 ENCOUNTER — Inpatient Hospital Stay (HOSPITAL_COMMUNITY): Payer: Medicare Other

## 2021-11-24 ENCOUNTER — Inpatient Hospital Stay (HOSPITAL_COMMUNITY): Payer: Medicare Other | Attending: Hematology

## 2021-11-24 VITALS — BP 125/69 | HR 58 | Temp 98.7°F | Resp 18 | Wt 156.6 lb

## 2021-11-24 DIAGNOSIS — E064 Drug-induced thyroiditis: Secondary | ICD-10-CM

## 2021-11-24 DIAGNOSIS — C772 Secondary and unspecified malignant neoplasm of intra-abdominal lymph nodes: Secondary | ICD-10-CM | POA: Diagnosis not present

## 2021-11-24 DIAGNOSIS — C787 Secondary malignant neoplasm of liver and intrahepatic bile duct: Secondary | ICD-10-CM | POA: Insufficient documentation

## 2021-11-24 DIAGNOSIS — C189 Malignant neoplasm of colon, unspecified: Secondary | ICD-10-CM

## 2021-11-24 DIAGNOSIS — Z5112 Encounter for antineoplastic immunotherapy: Secondary | ICD-10-CM | POA: Insufficient documentation

## 2021-11-24 DIAGNOSIS — Z95828 Presence of other vascular implants and grafts: Secondary | ICD-10-CM

## 2021-11-24 DIAGNOSIS — C184 Malignant neoplasm of transverse colon: Secondary | ICD-10-CM | POA: Diagnosis not present

## 2021-11-24 DIAGNOSIS — Z79899 Other long term (current) drug therapy: Secondary | ICD-10-CM | POA: Diagnosis not present

## 2021-11-24 DIAGNOSIS — C7800 Secondary malignant neoplasm of unspecified lung: Secondary | ICD-10-CM | POA: Diagnosis not present

## 2021-11-24 LAB — CBC WITH DIFFERENTIAL/PLATELET
Abs Immature Granulocytes: 0.02 10*3/uL (ref 0.00–0.07)
Basophils Absolute: 0.1 10*3/uL (ref 0.0–0.1)
Basophils Relative: 1 %
Eosinophils Absolute: 0.1 10*3/uL (ref 0.0–0.5)
Eosinophils Relative: 2 %
HCT: 41.4 % (ref 36.0–46.0)
Hemoglobin: 13.5 g/dL (ref 12.0–15.0)
Immature Granulocytes: 0 %
Lymphocytes Relative: 25 %
Lymphs Abs: 1.7 10*3/uL (ref 0.7–4.0)
MCH: 30.3 pg (ref 26.0–34.0)
MCHC: 32.6 g/dL (ref 30.0–36.0)
MCV: 93 fL (ref 80.0–100.0)
Monocytes Absolute: 0.7 10*3/uL (ref 0.1–1.0)
Monocytes Relative: 11 %
Neutro Abs: 4 10*3/uL (ref 1.7–7.7)
Neutrophils Relative %: 61 %
Platelets: 197 10*3/uL (ref 150–400)
RBC: 4.45 MIL/uL (ref 3.87–5.11)
RDW: 13.2 % (ref 11.5–15.5)
WBC: 6.6 10*3/uL (ref 4.0–10.5)
nRBC: 0 % (ref 0.0–0.2)

## 2021-11-24 LAB — COMPREHENSIVE METABOLIC PANEL
ALT: 14 U/L (ref 0–44)
AST: 22 U/L (ref 15–41)
Albumin: 4.4 g/dL (ref 3.5–5.0)
Alkaline Phosphatase: 64 U/L (ref 38–126)
Anion gap: 8 (ref 5–15)
BUN: 10 mg/dL (ref 8–23)
CO2: 27 mmol/L (ref 22–32)
Calcium: 9.6 mg/dL (ref 8.9–10.3)
Chloride: 103 mmol/L (ref 98–111)
Creatinine, Ser: 0.68 mg/dL (ref 0.44–1.00)
GFR, Estimated: >60 mL/min (ref >60)
Glucose, Bld: 101 mg/dL — ABNORMAL HIGH (ref 70–99)
Potassium: 4.5 mmol/L (ref 3.5–5.1)
Sodium: 138 mmol/L (ref 135–145)
Total Bilirubin: 0.4 mg/dL (ref 0.3–1.2)
Total Protein: 8 g/dL (ref 6.5–8.1)

## 2021-11-24 LAB — MAGNESIUM: Magnesium: 2.3 mg/dL (ref 1.7–2.4)

## 2021-11-24 LAB — TSH: TSH: 1.383 u[IU]/mL (ref 0.350–4.500)

## 2021-11-24 MED ORDER — HEPARIN SOD (PORK) LOCK FLUSH 100 UNIT/ML IV SOLN
500.0000 [IU] | Freq: Once | INTRAVENOUS | Status: AC | PRN
  Administered 2021-11-24: 500 [IU]

## 2021-11-24 MED ORDER — SODIUM CHLORIDE 0.9 % IV SOLN
200.0000 mg | Freq: Once | INTRAVENOUS | Status: AC
  Administered 2021-11-24: 200 mg via INTRAVENOUS
  Filled 2021-11-24: qty 8

## 2021-11-24 MED ORDER — SODIUM CHLORIDE 0.9 % IV SOLN: Freq: Once | INTRAVENOUS | Status: AC

## 2021-11-24 MED ORDER — SODIUM CHLORIDE 0.9% FLUSH
10.0000 mL | INTRAVENOUS | Status: DC | PRN
  Administered 2021-11-24: 10 mL

## 2021-11-24 NOTE — Progress Notes (Signed)
Patient presents today for Keytruda infusion per providers order.  Vital signs and labs within parameters for treatment.  Patient has no new complaints at this time.  Keytruda given today per MD orders.  Stable during infusion without adverse affects.  Vital signs stable.  No complaints at this time.  Discharge from clinic ambulatory in stable condition.  Alert and oriented X 3.  Follow up with [facility] as scheduled.  

## 2021-11-24 NOTE — Patient Instructions (Signed)
[facility]  Discharge Instructions: Thank you for choosing [facility] to provide your oncology and hematology care.  If you have a lab appointment with the Cancer Center, please come in thru the Main Entrance and check in at the main information desk.  Wear comfortable clothing and clothing appropriate for easy access to any Portacath or PICC line.   We strive to give you quality time with your provider. You may need to reschedule your appointment if you arrive late (15 or more minutes).  Arriving late affects you and other patients whose appointments are after yours.  Also, if you miss three or more appointments without notifying the office, you may be dismissed from the clinic at the provider's discretion.      For prescription refill requests, have your pharmacy contact our office and allow 72 hours for refills to be completed.    Today you received the following chemotherapy and/or immunotherapy agents Keytruda       To help prevent nausea and vomiting after your treatment, we encourage you to take your nausea medication as directed.  BELOW ARE SYMPTOMS THAT SHOULD BE REPORTED IMMEDIATELY: *FEVER GREATER THAN 100.4 F (38 C) OR HIGHER *CHILLS OR SWEATING *NAUSEA AND VOMITING THAT IS NOT CONTROLLED WITH YOUR NAUSEA MEDICATION *UNUSUAL SHORTNESS OF BREATH *UNUSUAL BRUISING OR BLEEDING *URINARY PROBLEMS (pain or burning when urinating, or frequent urination) *BOWEL PROBLEMS (unusual diarrhea, constipation, pain near the anus) TENDERNESS IN MOUTH AND THROAT WITH OR WITHOUT PRESENCE OF ULCERS (sore throat, sores in mouth, or a toothache) UNUSUAL RASH, SWELLING OR PAIN  UNUSUAL VAGINAL DISCHARGE OR ITCHING   Items with * indicate a potential emergency and should be followed up as soon as possible or go to the Emergency Department if any problems should occur.  Please show the CHEMOTHERAPY ALERT CARD or IMMUNOTHERAPY ALERT CARD at check-in to the Emergency  Department and triage nurse.  Should you have questions after your visit or need to cancel or reschedule your appointment, please contact [facility] [phone]  and follow the prompts.  Office hours are 8:00 a.m. to 4:30 p.m. Monday - Friday. Please note that voicemails left after 4:00 p.m. may not be returned until the following business day.  We are closed weekends and major holidays. You have access to a nurse at all times for urgent questions. Please call the main number to the clinic [phone] and follow the prompts.  For any non-urgent questions, you may also contact your provider using MyChart. We now offer e-Visits for anyone 18 and older to request care online for non-urgent symptoms. For details visit [email or web address].   Also download the MyChart app! Go to the app store, search "MyChart", open the app, select [facility], and log in with your MyChart username and password.  Due to Covid, a mask is required upon entering the hospital/clinic. If you do not have a mask, one will be given to you upon arrival. For doctor visits, patients may have 1 support person aged 18 or older with them. For treatment visits, patients cannot have anyone with them due to current Covid guidelines and our immunocompromised population.  

## 2021-12-15 ENCOUNTER — Inpatient Hospital Stay (HOSPITAL_COMMUNITY): Payer: Medicare Other

## 2021-12-15 ENCOUNTER — Inpatient Hospital Stay (HOSPITAL_COMMUNITY): Payer: Medicare Other | Attending: Hematology | Admitting: Hematology

## 2021-12-15 VITALS — BP 146/79 | HR 59 | Temp 98.2°F | Resp 18 | Ht 63.0 in | Wt 157.9 lb

## 2021-12-15 VITALS — BP 121/59 | HR 64 | Temp 98.3°F | Resp 18

## 2021-12-15 DIAGNOSIS — C7800 Secondary malignant neoplasm of unspecified lung: Secondary | ICD-10-CM | POA: Diagnosis not present

## 2021-12-15 DIAGNOSIS — C189 Malignant neoplasm of colon, unspecified: Secondary | ICD-10-CM

## 2021-12-15 DIAGNOSIS — Z79899 Other long term (current) drug therapy: Secondary | ICD-10-CM | POA: Diagnosis not present

## 2021-12-15 DIAGNOSIS — C778 Secondary and unspecified malignant neoplasm of lymph nodes of multiple regions: Secondary | ICD-10-CM | POA: Diagnosis not present

## 2021-12-15 DIAGNOSIS — C787 Secondary malignant neoplasm of liver and intrahepatic bile duct: Secondary | ICD-10-CM | POA: Insufficient documentation

## 2021-12-15 DIAGNOSIS — E064 Drug-induced thyroiditis: Secondary | ICD-10-CM

## 2021-12-15 DIAGNOSIS — Z5112 Encounter for antineoplastic immunotherapy: Secondary | ICD-10-CM | POA: Insufficient documentation

## 2021-12-15 DIAGNOSIS — C786 Secondary malignant neoplasm of retroperitoneum and peritoneum: Secondary | ICD-10-CM | POA: Insufficient documentation

## 2021-12-15 DIAGNOSIS — C184 Malignant neoplasm of transverse colon: Secondary | ICD-10-CM | POA: Diagnosis not present

## 2021-12-15 DIAGNOSIS — Z95828 Presence of other vascular implants and grafts: Secondary | ICD-10-CM

## 2021-12-15 LAB — COMPREHENSIVE METABOLIC PANEL
ALT: 16 U/L (ref 0–44)
AST: 24 U/L (ref 15–41)
Albumin: 4.4 g/dL (ref 3.5–5.0)
Alkaline Phosphatase: 65 U/L (ref 38–126)
Anion gap: 10 (ref 5–15)
BUN: 14 mg/dL (ref 8–23)
CO2: 25 mmol/L (ref 22–32)
Calcium: 9.3 mg/dL (ref 8.9–10.3)
Chloride: 103 mmol/L (ref 98–111)
Creatinine, Ser: 0.66 mg/dL (ref 0.44–1.00)
GFR, Estimated: >60 mL/min (ref >60)
Glucose, Bld: 98 mg/dL (ref 70–99)
Potassium: 4 mmol/L (ref 3.5–5.1)
Sodium: 138 mmol/L (ref 135–145)
Total Bilirubin: 0.6 mg/dL (ref 0.3–1.2)
Total Protein: 8 g/dL (ref 6.5–8.1)

## 2021-12-15 LAB — CBC WITH DIFFERENTIAL/PLATELET
Abs Immature Granulocytes: 0.02 10*3/uL (ref 0.00–0.07)
Basophils Absolute: 0.1 10*3/uL (ref 0.0–0.1)
Basophils Relative: 1 %
Eosinophils Absolute: 0.1 10*3/uL (ref 0.0–0.5)
Eosinophils Relative: 2 %
HCT: 43.3 % (ref 36.0–46.0)
Hemoglobin: 13.9 g/dL (ref 12.0–15.0)
Immature Granulocytes: 0 %
Lymphocytes Relative: 23 %
Lymphs Abs: 1.5 10*3/uL (ref 0.7–4.0)
MCH: 29.9 pg (ref 26.0–34.0)
MCHC: 32.1 g/dL (ref 30.0–36.0)
MCV: 93.1 fL (ref 80.0–100.0)
Monocytes Absolute: 0.8 10*3/uL (ref 0.1–1.0)
Monocytes Relative: 12 %
Neutro Abs: 4 10*3/uL (ref 1.7–7.7)
Neutrophils Relative %: 62 %
Platelets: 178 10*3/uL (ref 150–400)
RBC: 4.65 MIL/uL (ref 3.87–5.11)
RDW: 13.2 % (ref 11.5–15.5)
WBC: 6.5 10*3/uL (ref 4.0–10.5)
nRBC: 0 % (ref 0.0–0.2)

## 2021-12-15 LAB — MAGNESIUM: Magnesium: 2.2 mg/dL (ref 1.7–2.4)

## 2021-12-15 LAB — TSH: TSH: 1.743 u[IU]/mL (ref 0.350–4.500)

## 2021-12-15 MED ORDER — SODIUM CHLORIDE 0.9 % IV SOLN: Freq: Once | INTRAVENOUS | Status: AC

## 2021-12-15 MED ORDER — HEPARIN SOD (PORK) LOCK FLUSH 100 UNIT/ML IV SOLN
500.0000 [IU] | Freq: Once | INTRAVENOUS | Status: AC | PRN
  Administered 2021-12-15: 500 [IU]

## 2021-12-15 MED ORDER — SODIUM CHLORIDE 0.9% FLUSH
10.0000 mL | INTRAVENOUS | Status: DC | PRN
  Administered 2021-12-15: 10 mL

## 2021-12-15 MED ORDER — SODIUM CHLORIDE 0.9 % IV SOLN
200.0000 mg | Freq: Once | INTRAVENOUS | Status: AC
  Administered 2021-12-15: 200 mg via INTRAVENOUS
  Filled 2021-12-15: qty 8

## 2021-12-15 NOTE — Progress Notes (Signed)
Patient presents today for Keytruda infusion.  Patient is in satisfactory condition with no complaints voiced.  Vital signs are stable.  Labs reviewed by Dr. Katragadda during her office visit.  All labs are within treatment parameters.  We will proceed with treatment per MD orders.   Patient tolerated treatment well with no complaints voiced.  Patient left ambulatory in stable condition.  Vital signs stable at discharge.  Follow up as scheduled.    

## 2021-12-15 NOTE — Progress Notes (Signed)
Amanda Norris, [place] [id number]   CLINIC:  Medical Oncology/Hematology  PCP:  [facility], [place] / [place] [address] [phone]   REASON FOR VISIT:  Follow-up for transverse colon adenocarcinoma  PRIOR THERAPY:  1. Right hemicolectomy on 12/29/2013. 2. FOLFOX x 3 cycles from 02/10/2014 to 03/10/2014, stopped secondary to poor tolerance  NGS Results: MSI high, MMR deficient, BRAF V600E, TMB high  CURRENT THERAPY: Keytruda every 3 weeks  BRIEF ONCOLOGIC HISTORY:  Oncology History  Adenocarcinoma of transverse colon ([facility]) (Resolved)  12/09/2013 Initial Diagnosis   1. Colon, biopsy, proximal transverse mass INVASIVE ADENOCARCINOMA.   12/29/2013 Definitive Surgery   Colon, segmental resection for tumor, right - INVASIVE COLORECTAL ADENOCARCINOMA, 7 CM, EXTENDING INTO PERICOLONIC CONNECTIVE TISSUE. - METASTATIC CARCINOMA IN 6 OF 33 LYMPH NODES (6/33).   02/10/2014 - 03/10/2014 Adjuvant Chemotherapy   FOLFOX x 3 cycles   03/11/2014 Adverse Reaction   Patient called reporting diarrhea despite dose reduction and she wants to stop therapy.  Patient seen on 03/24/14 to discuss other treatment options and she stands by her decision to stop all therapy.   04/01/2014 Surgery   Port-a-cath removal by Dr. Arnoldo Morale.   01/12/2015 PET scan   Mild abdominal mesenteric LAD show hypermetabolic activity, 7 mm indeterminate pulm nodule in sup segment of RLL shows no assoc metabolic activity   9/52/8413 PET scan   Mild progression of nodal mets in anterior mid abdominal mesentery, new nodal mets at L thoracic inlet. stable 6 mm nodule in posterior RLL, unchanged since 2015   06/22/2015 PET scan   Resolution of metabolic activity and decreased size of mild LAD at L thoracic inlet, stable mild mid abd hypermetabolic mesenteric LAD, stable 6 mm posterior LLL pulm nodule without metabolic activity   2/44/0102 Imaging   MRI lumbar spine L4-L5 disc  degenerated, broad based disc hernation, B facet arthropathy with hypertophy and edema, stenosis of lateral recesses that could cause neural compression   10/18/2016 Imaging   CT CAP- 1. Several small ground-glass pulmonary nodules are present in the lungs and are stable over the past 9 months, but several are mildly larger than they were in 2015. Low-grade adenocarcinoma can sometimes have this appearance and surveillance is likely warranted. 2. There is a cluster of nodal tissue in the central mesentery, maximum short axis diameter 1.3 cm. This nodal cluster is relatively low in density and not appreciably changed from the prior exam. Given the lack of change is may represent quiescent residua of malignancy, but again, surveillance is likely warranted. 3. The left-sided rib fractures are more numerous than was revealed on the prior radiographs ; there are nondisplaced healing fractures of the left fifth, sixth, seventh, eighth, ninth, and tenth ribs. 4. Mild cardiomegaly although part of this appearance may be due to pectus excavatum. 5. Several additional small pulmonary nodules are stable from 2015 and considered benign. 6. Right hemicolectomy and sigmoid colon diverticulosis. 7. Small right paraumbilical hernia containing adipose tissue. 8. Lower lumbar spondylosis and degenerative disc disease potentially with mild impingement at L4-5.   04/25/2017 Imaging   CT C/A/P: Scattered solid pulmonary nodules measure up to 6 mm in the right lower lobe, unchanged. There are a few scattered ground-glass nodules measuring up to 8 mm in the right upper lobe, also stable. Distal perigastric lymph nodes are stable. No additional evidence of metastatic disease.   Metastatic colon cancer to liver ([facility])  11/04/2020 Initial Diagnosis  Metastatic colon cancer to liver [facility])   11/10/2020 - 12/10/2020 Chemotherapy         01/13/2021 -  Chemotherapy   Patient is on Treatment Plan : COLORECTAL  Pembrolizumab q21d       CANCER STAGING:  Cancer Staging  No matching staging information was found for the patient.  INTERVAL HISTORY:  Amanda Norris, a 76 y.o. female, returns for routine follow-up and consideration for next cycle of chemotherapy. Annika was last seen on 11/03/2021.  Due for cycle #17 of Keytruda today.   Overall, she tells me she has been feeling pretty well. She reports her diarrhea is controled with 2-3 tablets of Imodium daily. She denies new pains, skin rash, SOB, cough, and change in BM. Her appetite is good. She reports fatigue for 2 days following treatment.  Overall, she feels ready for next cycle of chemo today.    REVIEW OF SYSTEMS:  Review of Systems  Constitutional:  Negative for appetite change and fatigue.  Respiratory:  Negative for cough and shortness of breath.   Gastrointestinal:  Positive for diarrhea. Negative for constipation.  Skin:  Negative for rash.  All other systems reviewed and are negative.   PAST MEDICAL/SURGICAL HISTORY:  Past Medical History:  Diagnosis Date   Arthritis    wrist and thumbs   Colon cancer (HCC)    PONV (postoperative nausea and vomiting)    Port-A-Cath in place 11/09/2020   Ruptured lumbar disc    L1-L5 per patient report   Sciatica of left side    Past Surgical History:  Procedure Laterality Date   COLONOSCOPY N/A 12/12/2013   Procedure: COLONOSCOPY;  Surgeon: Rogene Houston, MD;  [facility];  Service: Endoscopy;  Laterality: N/A;  730   COLONOSCOPY N/A 01/20/2015   Procedure: COLONOSCOPY;  Surgeon: Rogene Houston, MD;  Location: [facility];  Service: Endoscopy;  Laterality: N/A;  1030   COLONOSCOPY N/A 03/28/2018   Procedure: COLONOSCOPY;  Surgeon: Rogene Houston, MD;  Location: [facility];  Service: Endoscopy;  Laterality: N/A;  1015   EUS N/A 12/18/2013   Procedure: UPPER ENDOSCOPIC ULTRASOUND (EUS) LINEAR;  Surgeon: Milus Banister, MD;  Location: [facility];  Service: Endoscopy;   Laterality: N/A;   herniated disc     1996-[id number]   IR IMAGING GUIDED PORT INSERTION  10/14/2020   IR US GUIDANCE  10/14/2020   PARTIAL COLECTOMY N/A 12/29/2013   Procedure: RIGHT HEMICOLECTOMY;  Surgeon: Jamesetta So, MD;  Location: [facility];  Service: General;  Laterality: N/A;   POLYPECTOMY  03/28/2018   Procedure: POLYPECTOMY;  Surgeon: Rogene Houston, MD;  Location[facility];  Service: Endoscopy;;  transverse colon (hot snare);   PORT-A-CATH REMOVAL Right 04/01/2014   Procedure: MINOR REMOVAL PORT-A-CATH;  Surgeon: Jamesetta So, MD;  Location: [facility];  Service: General;  Laterality: Right;   PORTACATH PLACEMENT Right 02/04/2014   Procedure: INSERTION PORT-A-CATH;  Surgeon: Jamesetta So, MD;  Location: [facility];  Service: General;  Laterality: Right;    SOCIAL HISTORY:  Social History   Socioeconomic History   Marital status: Married    Spouse name: Not on file   Number of children: Not on file   Years of education: Not on file   Highest education level: Not on file  Occupational History   Not on file  Tobacco Use   Smoking status: Former    Packs/day: 0.25    Years: 5.00  Total pack years: 1.25    Types: Cigarettes    Quit date: 04/19/1959    Years since quitting: 62.7   Smokeless tobacco: Never   Tobacco comments:    smoked for 5 years as teenager  Vaping Use   Vaping Use: Never used  Substance and Sexual Activity   Alcohol use: No   Drug use: No   Sexual activity: Yes    Birth control/protection: Post-menopausal  Other Topics Concern   Not on file  Social History Narrative   Not on file   Social Determinants of Health   Financial Resource Strain: Not on file  Food Insecurity: Not on file  Transportation Needs: Not on file  Physical Activity: Inactive (10/04/2020)   Exercise Vital Sign    Days of Exercise per Week: 0 days    Minutes of Exercise per Session: 0 min  Stress: Stress Concern Present (10/04/2020)   [place]    Feeling of Stress : To some extent  Social Connections: Not on file  Intimate Partner Violence: Not on file    FAMILY HISTORY:  Family History  Problem Relation Age of Onset   Diabetes type II Mother    Dementia Father     CURRENT MEDICATIONS:  Current Outpatient Medications  Medication Sig Dispense Refill   acetaminophen (TYLENOL) 500 MG tablet Take 500 mg by mouth every 6 (six) hours as needed for moderate pain or headache.     aspirin 81 MG EC tablet Take 1 tablet (81 mg total) by mouth daily. Swallow whole. 30 tablet 12   Bioflavonoid Products (ESTER C PO) Take 1,000 mg by mouth daily.     Cholecalciferol (D3-1000) 25 MCG (1000 UT) capsule Take 125 Units by mouth daily.     Cranberry-Vitamin C-Vitamin E 4200-20-3 MG-MG-UNIT CAPS Take 500 mg by mouth daily.     Garlic 4196 MG CAPS Take 1,000 mg by mouth daily.      lidocaine-prilocaine (EMLA) cream SMARTSIG:sparingly Topical     Multiple Vitamin (MULTIVITAMIN) tablet Take 1 tablet by mouth daily.     Omega-3 Fatty Acids (FISH OIL) 1200 MG CAPS Take 1,000 mg by mouth daily.     OVER THE COUNTER MEDICATION Take 1,000 mg by mouth daily. [place] 1000 mg daily     OVER THE COUNTER MEDICATION Take 1,000 mg by mouth daily. L-Lysine $RemoveBeforeD'1000mg'wIcjzsGPxCCEDf$  po daily     pramoxine-hydrocortisone (PROCTOCREAM-HC) 1-1 % rectal cream Place 1 application rectally 2 (two) times daily. 30 g 0   zinc gluconate 50 MG tablet Take 50 mg by mouth daily.     No current facility-administered medications for this visit.    ALLERGIES:  Allergies  Allergen Reactions   Sulfa Antibiotics Other (See Comments)    "Sores in my mouth"    PHYSICAL EXAM:  Performance status (ECOG): 1 - Symptomatic but completely ambulatory  Vitals:   12/15/21 1258  BP: (!) 146/79  Pulse: (!) 59  Resp: 18  Temp: 98.2 F (36.8 C)  SpO2: 94%   Wt Readings from Last 3 Encounters:  12/15/21 157 lb 14.4 oz (71.6 kg)  11/24/21 156 lb 9.6 oz (71  kg)  11/03/21 157 lb 4.8 oz (71.4 kg)   Physical Exam Vitals reviewed.  Constitutional:      Appearance: Normal appearance.  Cardiovascular:     Rate and Rhythm: Normal rate and regular rhythm.     Pulses: Normal pulses.     Heart sounds: Normal  heart sounds.  Pulmonary:     Effort: Pulmonary effort is normal.     Breath sounds: Normal breath sounds.  Abdominal:     Palpations: Abdomen is soft. There is no hepatomegaly, splenomegaly or mass.     Tenderness: There is no abdominal tenderness.  Musculoskeletal:     Right lower leg: No edema.     Left lower leg: No edema.  Lymphadenopathy:     Lower Body: No right inguinal adenopathy. No left inguinal adenopathy.  Neurological:     General: No focal deficit present.     Mental Status: She is alert and oriented to person, place, and time.  Psychiatric:        Mood and Affect: Mood normal.        Behavior: Behavior normal.     LABORATORY DATA:  I have reviewed the labs as listed.     Latest Ref Rng & Units 12/15/2021   11:50 AM 11/24/2021    1:00 PM 11/03/2021   11:49 AM  CBC  WBC 4.0 - 10.5 K/uL 6.5  6.6  6.6   Hemoglobin 12.0 - 15.0 g/dL 13.9  13.5  13.2   Hematocrit 36.0 - 46.0 % 43.3  41.4  41.1   Platelets 150 - 400 K/uL 178  197  193       Latest Ref Rng & Units 12/15/2021   11:50 AM 11/24/2021    1:00 PM 11/03/2021   11:49 AM  CMP  Glucose 70 - 99 mg/dL 98  101  96   BUN 8 - 23 mg/dL $Remove'14  10  14   'FwBScNr$ Creatinine 0.44 - 1.00 mg/dL 0.66  0.68  0.64   Sodium 135 - 145 mmol/L 138  138  139   Potassium 3.5 - 5.1 mmol/L 4.0  4.5  4.0   Chloride 98 - 111 mmol/L 103  103  105   CO2 22 - 32 mmol/L $RemoveB'25  27  27   'DlUFHDTw$ Calcium 8.9 - 10.3 mg/dL 9.3  9.6  9.5   Total Protein 6.5 - 8.1 g/dL 8.0  8.0  8.2   Total Bilirubin 0.3 - 1.2 mg/dL 0.6  0.4  0.6   Alkaline Phos 38 - 126 U/L 65  64  66   AST 15 - 41 U/L $Remo'24  22  23   'GELhE$ ALT 0 - 44 U/L $Remo'16  14  16     'ZRuFO$ DIAGNOSTIC IMAGING:  I have independently reviewed the scans and discussed with the  patient. No results found.   ASSESSMENT:  1.  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 - Status post 3 cycles of FOLFOX from 02/10/2014 through 03/10/2014, discontinued secondary to poor tolerance.  DPD was negative. -Last colonoscopy on 03/28/2018 patent ileo-colonic anastomosis.  8 mm polyp in the transverse colon.  Diverticulosis in the sigmoid colon, descending colon. - CT scan on 11/04/2018 shows postoperative findings of right colectomy with redemonstrated mesenteric nodule/lymph node measuring 1.9 x 1.7 cm, unchanged from prior scan in October 2019.  No evidence of metastatic disease.  Stable solid and groundglass pulmonary nodules, stable over multiple prior exams. -CEA was 4.6 on 10/28/2018. -CTAP on 09/21/2020 showed numerous small pulmonary nodules throughout the lung bases, 5 mm.  Enlarged retrocrural periaortic lymph node in the lower chest.  Soft tissue nodule within the central mesentery substantially increased in size, encasing proximal superior mesenteric artery  and measuring 5 x 4.3 cm.  Numerous retroperitoneal lymph nodes, largest aortocaval lymph node measuring 2.8 x 2.2 cm. -CT chest on 09/27/2020 shows a left thoracic inlet lymph node approximately 2.3 cm.  Mediastinal lymph nodes and multiple subcentimeter pulmonary nodules suggestive of metastatic disease. -Mesenteric lymph node biopsy on 09/28/2020 shows mucin and fibrosis. -Left supraclavicular lymph node biopsy on 10/14/2020-soft tissue with abundant mucin, rare fragments of atypical columnar epithelium. - 3 cycles of FOLFIRI and bevacizumab dose reduced from 11/10/2020 through 12/08/2020. - [facility] test-BRAF V600 E+, MMR deficient, MSI high, TMB high, HER2 negative, BRCA1/2 pathogenic variant on exon 14 and exon 9 respectively.  The report also suggested decreased benefit to FOLFOX and  bevacizumab in the first-line metastatic setting. - Cycle 1 of Keytruda on 01/13/2021.   2.  Health maintenance: -Mammogram dated 05/16/2018 was BI-RADS Category 1.   PLAN:  1.  Metastatic colon cancer to the liver, lungs and lymph nodes: - She does not have any immunotherapy related side effects.  Chronic diarrhea is stable.  She reports mild fatigue for couple of days after each treatment. - Last CEA was 5.4 on 11/03/2021, slightly up from 5.2 in March.  Level from today is pending. - CT CAP (09/28/2021): Metastatic disease in the lungs, retroperitoneum, mesentery is stable. - Reviewed labs today which shows normal LFTs and CBC.  TSH was 1.7. - Proceed with Keytruda today and in 3 weeks.  RTC 6 weeks.  We will schedule for CT CAP with contrast prior to next visit.   2.  Diarrhea: - Baseline diarrhea about 3-4 watery bowel movements since surgery has been stable. - She is taking 2-3 Imodium tablets daily.   Orders placed this encounter:  Orders Placed This Encounter  Procedures   CT CHEST [place], [place] [phone]   I, Thana Ates, am acting as a scribe for Dr. Derek Jack.  I, Derek Jack MD, have reviewed the above documentation for accuracy and completeness, and I agree with the above.

## 2021-12-15 NOTE — Patient Instructions (Signed)
[facility]  Discharge Instructions: Thank you for choosing [facility] to provide your oncology and hematology care.  If you have a lab appointment with the [facility], please come in thru the Main Entrance and check in at the main information desk.  Wear comfortable clothing and clothing appropriate for easy access to any Portacath or PICC line.   We strive to give you quality time with your provider. You may need to reschedule your appointment if you arrive late (15 or more minutes).  Arriving late affects you and other patients whose appointments are after yours.  Also, if you miss three or more appointments without notifying the office, you may be dismissed from the clinic at the provider's discretion.      For prescription refill requests, have your pharmacy contact our office and allow 72 hours for refills to be completed.    Pembrolizumab injection What is this medication? PEMBROLIZUMAB (pem broe liz ue mab) is a monoclonal antibody. It is used to treat certain types of cancer. This medicine may be used for other purposes; ask your health care provider or pharmacist if you have questions. COMMON BRAND NAME(S): Keytruda What should I tell my care team before I take this medication? They need to know if you have any of these conditions: autoimmune diseases like Crohn's disease, ulcerative colitis, or lupus have had or planning to have an allogeneic stem cell transplant (uses someone else's stem cells) history of organ transplant history of chest radiation nervous system problems like myasthenia gravis or Guillain-Barre syndrome an unusual or allergic reaction to pembrolizumab, other medicines, foods, dyes, or preservatives pregnant or trying to get pregnant breast-feeding How should I use this medication? This medicine is for infusion into a vein. It is given by a health care professional in a hospital or clinic setting. A special MedGuide will be given  to you before each treatment. Be sure to read this information carefully each time. Talk to your pediatrician regarding the use of this medicine in children. While this drug may be prescribed for children as young as 6 months for selected conditions, precautions do apply. Overdosage: If you think you have taken too much of this medicine contact a poison control center or emergency room at once. NOTE: This medicine is only for you. Do not share this medicine with others. What if I miss a dose? It is important not to miss your dose. Call your doctor or health care professional if you are unable to keep an appointment. What may interact with this medication? Interactions have not been studied. This list may not describe all possible interactions. Give your health care provider a list of all the medicines, herbs, non-prescription drugs, or dietary supplements you use. Also tell them if you smoke, drink alcohol, or use illegal drugs. Some items may interact with your medicine. What should I watch for while using this medication? Your condition will be monitored carefully while you are receiving this medicine. You may need blood work done while you are taking this medicine. Do not become pregnant while taking this medicine or for 4 months after stopping it. Women should inform their doctor if they wish to become pregnant or think they might be pregnant. There is a potential for serious side effects to an unborn child. Talk to your health care professional or pharmacist for more information. Do not breast-feed an infant while taking this medicine or for 4 months after the last dose. What side effects may I  notice from receiving this medication? Side effects that you should report to your doctor or health care professional as soon as possible: allergic reactions like skin rash, itching or hives, swelling of the face, lips, or tongue bloody or black, tarry breathing problems changes in vision chest  pain chills confusion constipation cough diarrhea dizziness or feeling faint or lightheaded fast or irregular heartbeat fever flushing joint pain low blood counts - this medicine may decrease the number of white blood cells, red blood cells and platelets. You may be at increased risk for infections and bleeding. muscle pain muscle weakness pain, tingling, numbness in the hands or feet persistent headache redness, blistering, peeling or loosening of the skin, including inside the mouth signs and symptoms of high blood sugar such as dizziness; dry mouth; dry skin; fruity breath; nausea; stomach pain; increased hunger or thirst; increased urination signs and symptoms of kidney injury like trouble passing urine or change in the amount of urine signs and symptoms of liver injury like dark urine, light-colored stools, loss of appetite, nausea, right upper belly pain, yellowing of the eyes or skin sweating swollen lymph nodes weight loss Side effects that usually do not require medical attention (report to your doctor or health care professional if they continue or are bothersome): decreased appetite hair loss tiredness This list may not describe all possible side effects. Call your doctor for medical advice about side effects. You may report side effects to FDA at 1-800-FDA-1088. Where should I keep my medication? This drug is given in a hospital or clinic and will not be stored at home. NOTE: This sheet is a summary. It may not cover all possible information. If you have questions about this medicine, talk to your doctor, pharmacist, or health care provider.  2023 Elsevier/Gold Standard (2021-05-27 00:00:00)    To help prevent nausea and vomiting after your treatment, we encourage you to take your nausea medication as directed.  BELOW ARE SYMPTOMS THAT SHOULD BE REPORTED IMMEDIATELY: *FEVER GREATER THAN 100.4 F (38 C) OR HIGHER *CHILLS OR SWEATING *NAUSEA AND VOMITING THAT IS NOT  CONTROLLED WITH YOUR NAUSEA MEDICATION *UNUSUAL SHORTNESS OF BREATH *UNUSUAL BRUISING OR BLEEDING *URINARY PROBLEMS (pain or burning when urinating, or frequent urination) *BOWEL PROBLEMS (unusual diarrhea, constipation, pain near the anus) TENDERNESS IN MOUTH AND THROAT WITH OR WITHOUT PRESENCE OF ULCERS (sore throat, sores in mouth, or a toothache) UNUSUAL RASH, SWELLING OR PAIN  UNUSUAL VAGINAL DISCHARGE OR ITCHING   Items with * indicate a potential emergency and should be followed up as soon as possible or go to the Emergency Department if any problems should occur.  Please show the CHEMOTHERAPY ALERT CARD or IMMUNOTHERAPY ALERT CARD at check-in to the Emergency Department and triage nurse.  Should you have questions after your visit or need to cancel or reschedule your appointment, please contact [facility] [phone]  and follow the prompts.  Office hours are 8:00 a.m. to 4:30 p.m. Monday - Friday. Please note that voicemails left after 4:00 p.m. may not be returned until the following business day.  We are closed weekends and major holidays. You have access to a nurse at all times for urgent questions. Please call the main number to the clinic [phone] and follow the prompts.  For any non-urgent questions, you may also contact your provider using MyChart. We now offer e-Visits for anyone 2 and older to request care online for non-urgent symptoms. For details visit [email or web address].   Also download the MyChart app! Go  to the app store, search "MyChart", open the app, select [facility], and log in with your MyChart username and password.  Due to Covid, a mask is required upon entering the hospital/clinic. If you do not have a mask, one will be given to you upon arrival. For doctor visits, patients may have 1 support person aged 52 or older with them. For treatment visits, patients cannot have anyone with them due to current Covid guidelines and our  immunocompromised population.

## 2021-12-15 NOTE — Progress Notes (Signed)
Patient has been examined by Dr. Katragadda, and vital signs and labs have been reviewed. ANC, Creatinine, LFTs, hemoglobin, and platelets are within treatment parameters per M.D. - pt may proceed with treatment.    °

## 2021-12-15 NOTE — Patient Instructions (Addendum)
[place] at [facility] Discharge Instructions   You were seen and examined today by Dr. Delton Coombes.  He reviewed the results of your lab work which are normal/stable.   We will proceed with your treatment today and every 3 weeks.  We will repeat a CT scan prior to your next office visit in 6 weeks.   Return as scheduled.    Thank you for choosing [place] at [facility] to provide your oncology and hematology care.  To afford each patient quality time with our provider, please arrive at least 15 minutes before your scheduled appointment time.   If you have a lab appointment with the [facility] please come in thru the Main Entrance and check in at the main information desk.  You need to re-schedule your appointment should you arrive 10 or more minutes late.  We strive to give you quality time with our providers, and arriving late affects you and other patients whose appointments are after yours.  Also, if you no show three or more times for appointments you may be dismissed from the clinic at the providers discretion.     Again, thank you for choosing [facility].  Our hope is that these requests will decrease the amount of time that you wait before being seen by our physicians.       _____________________________________________________________  Should you have questions after your visit to [facility], please contact our office at [phone] and follow the prompts.  Our office hours are 8:00 a.m. and 4:30 p.m. Monday - Friday.  Please note that voicemails left after 4:00 p.m. may not be returned until the following business day.  We are closed weekends and major holidays.  You do have access to a nurse 24-7, just call the main number to the clinic [phone] and do not press any options, hold on the line and a nurse will answer the phone.    For prescription refill requests, have your pharmacy contact our  office and allow 72 hours.    Due to Covid, you will need to wear a mask upon entering the hospital. If you do not have a mask, a mask will be given to you at the Main Entrance upon arrival. For doctor visits, patients may have 1 support person age 62 or older with them. For treatment visits, patients can not have anyone with them due to social distancing guidelines and our immunocompromised population.

## 2021-12-16 LAB — CEA: CEA: 5.5 ng/mL — ABNORMAL HIGH (ref 0.0–4.7)

## 2022-01-02 ENCOUNTER — Other Ambulatory Visit (HOSPITAL_BASED_OUTPATIENT_CLINIC_OR_DEPARTMENT_OTHER): Payer: Medicare Other

## 2022-01-05 ENCOUNTER — Inpatient Hospital Stay (HOSPITAL_COMMUNITY): Payer: Medicare Other

## 2022-01-05 ENCOUNTER — Ambulatory Visit (HOSPITAL_COMMUNITY): Payer: Medicare Other | Admitting: Hematology

## 2022-01-05 VITALS — BP 115/67 | HR 65 | Temp 98.3°F | Resp 18 | Wt 158.3 lb

## 2022-01-05 DIAGNOSIS — C787 Secondary malignant neoplasm of liver and intrahepatic bile duct: Secondary | ICD-10-CM | POA: Diagnosis not present

## 2022-01-05 DIAGNOSIS — C184 Malignant neoplasm of transverse colon: Secondary | ICD-10-CM

## 2022-01-05 DIAGNOSIS — C7800 Secondary malignant neoplasm of unspecified lung: Secondary | ICD-10-CM | POA: Diagnosis not present

## 2022-01-05 DIAGNOSIS — Z95828 Presence of other vascular implants and grafts: Secondary | ICD-10-CM

## 2022-01-05 DIAGNOSIS — C189 Malignant neoplasm of colon, unspecified: Secondary | ICD-10-CM

## 2022-01-05 DIAGNOSIS — Z5112 Encounter for antineoplastic immunotherapy: Secondary | ICD-10-CM | POA: Diagnosis not present

## 2022-01-05 DIAGNOSIS — C778 Secondary and unspecified malignant neoplasm of lymph nodes of multiple regions: Secondary | ICD-10-CM | POA: Diagnosis not present

## 2022-01-05 DIAGNOSIS — E064 Drug-induced thyroiditis: Secondary | ICD-10-CM

## 2022-01-05 DIAGNOSIS — C786 Secondary malignant neoplasm of retroperitoneum and peritoneum: Secondary | ICD-10-CM | POA: Diagnosis not present

## 2022-01-05 LAB — COMPREHENSIVE METABOLIC PANEL
ALT: 16 U/L (ref 0–44)
AST: 22 U/L (ref 15–41)
Albumin: 4.2 g/dL (ref 3.5–5.0)
Alkaline Phosphatase: 60 U/L (ref 38–126)
Anion gap: 7 (ref 5–15)
BUN: 11 mg/dL (ref 8–23)
CO2: 26 mmol/L (ref 22–32)
Calcium: 9.3 mg/dL (ref 8.9–10.3)
Chloride: 106 mmol/L (ref 98–111)
Creatinine, Ser: 0.61 mg/dL (ref 0.44–1.00)
GFR, Estimated: >60 mL/min (ref >60)
Glucose, Bld: 97 mg/dL (ref 70–99)
Potassium: 4.1 mmol/L (ref 3.5–5.1)
Sodium: 139 mmol/L (ref 135–145)
Total Bilirubin: 0.5 mg/dL (ref 0.3–1.2)
Total Protein: 7.8 g/dL (ref 6.5–8.1)

## 2022-01-05 LAB — CBC WITH DIFFERENTIAL/PLATELET
Abs Immature Granulocytes: 0.01 10*3/uL (ref 0.00–0.07)
Basophils Absolute: 0 10*3/uL (ref 0.0–0.1)
Basophils Relative: 1 %
Eosinophils Absolute: 0.1 10*3/uL (ref 0.0–0.5)
Eosinophils Relative: 2 %
HCT: 41.5 % (ref 36.0–46.0)
Hemoglobin: 13.4 g/dL (ref 12.0–15.0)
Immature Granulocytes: 0 %
Lymphocytes Relative: 24 %
Lymphs Abs: 1.5 10*3/uL (ref 0.7–4.0)
MCH: 29.8 pg (ref 26.0–34.0)
MCHC: 32.3 g/dL (ref 30.0–36.0)
MCV: 92.4 fL (ref 80.0–100.0)
Monocytes Absolute: 0.6 10*3/uL (ref 0.1–1.0)
Monocytes Relative: 10 %
Neutro Abs: 4.1 10*3/uL (ref 1.7–7.7)
Neutrophils Relative %: 63 %
Platelets: 171 10*3/uL (ref 150–400)
RBC: 4.49 MIL/uL (ref 3.87–5.11)
RDW: 13.3 % (ref 11.5–15.5)
WBC: 6.4 10*3/uL (ref 4.0–10.5)
nRBC: 0 % (ref 0.0–0.2)

## 2022-01-05 LAB — TSH: TSH: 1.615 u[IU]/mL (ref 0.350–4.500)

## 2022-01-05 LAB — MAGNESIUM: Magnesium: 2.2 mg/dL (ref 1.7–2.4)

## 2022-01-05 MED ORDER — SODIUM CHLORIDE 0.9 % IV SOLN
200.0000 mg | Freq: Once | INTRAVENOUS | Status: AC
  Administered 2022-01-05: 200 mg via INTRAVENOUS
  Filled 2022-01-05: qty 8

## 2022-01-05 MED ORDER — HEPARIN SOD (PORK) LOCK FLUSH 100 UNIT/ML IV SOLN
500.0000 [IU] | Freq: Once | INTRAVENOUS | Status: AC | PRN
  Administered 2022-01-05: 500 [IU]

## 2022-01-05 MED ORDER — SODIUM CHLORIDE 0.9 % IV SOLN: Freq: Once | INTRAVENOUS | Status: AC

## 2022-01-05 MED ORDER — SODIUM CHLORIDE 0.9% FLUSH
10.0000 mL | INTRAVENOUS | Status: DC | PRN
  Administered 2022-01-05: 10 mL

## 2022-01-05 NOTE — Patient Instructions (Signed)
[facility]  Discharge Instructions: Thank you for choosing [facility] to provide your oncology and hematology care.  If you have a lab appointment with the Cancer Center, please come in thru the Main Entrance and check in at the main information desk.  Wear comfortable clothing and clothing appropriate for easy access to any Portacath or PICC line.   We strive to give you quality time with your provider. You may need to reschedule your appointment if you arrive late (15 or more minutes).  Arriving late affects you and other patients whose appointments are after yours.  Also, if you miss three or more appointments without notifying the office, you may be dismissed from the clinic at the provider's discretion.      For prescription refill requests, have your pharmacy contact our office and allow 72 hours for refills to be completed.    Today you received the following chemotherapy and/or immunotherapy agents Keytruda   To help prevent nausea and vomiting after your treatment, we encourage you to take your nausea medication as directed.  BELOW ARE SYMPTOMS THAT SHOULD BE REPORTED IMMEDIATELY: *FEVER GREATER THAN 100.4 F (38 C) OR HIGHER *CHILLS OR SWEATING *NAUSEA AND VOMITING THAT IS NOT CONTROLLED WITH YOUR NAUSEA MEDICATION *UNUSUAL SHORTNESS OF BREATH *UNUSUAL BRUISING OR BLEEDING *URINARY PROBLEMS (pain or burning when urinating, or frequent urination) *BOWEL PROBLEMS (unusual diarrhea, constipation, pain near the anus) TENDERNESS IN MOUTH AND THROAT WITH OR WITHOUT PRESENCE OF ULCERS (sore throat, sores in mouth, or a toothache) UNUSUAL RASH, SWELLING OR PAIN  UNUSUAL VAGINAL DISCHARGE OR ITCHING   Items with * indicate a potential emergency and should be followed up as soon as possible or go to the Emergency Department if any problems should occur.  Please show the CHEMOTHERAPY ALERT CARD or IMMUNOTHERAPY ALERT CARD at check-in to the Emergency Department  and triage nurse.  Should you have questions after your visit or need to cancel or reschedule your appointment, please contact  CANCER CENTER [phone]  and follow the prompts.  Office hours are 8:00 a.m. to 4:30 p.m. Monday - Friday. Please note that voicemails left after 4:00 p.m. may not be returned until the following business day.  We are closed weekends and major holidays. You have access to a nurse at all times for urgent questions. Please call the main number to the clinic [phone] and follow the prompts.  For any non-urgent questions, you may also contact your provider using MyChart. We now offer e-Visits for anyone 18 and older to request care online for non-urgent symptoms. For details visit [email or web address].   Also download the MyChart app! Go to the app store, search "MyChart", open the app, select [place], and log in with your MyChart username and password.  Masks are optional in the cancer centers. If you would like for your care team to wear a mask while they are taking care of you, please let them know. For doctor visits, patients may have with them one support person who is at least 76 years old. At this time, visitors are not allowed in the infusion area. Pembrolizumab injection What is this medication? PEMBROLIZUMAB (pem broe liz ue mab) is a monoclonal antibody. It is used to treat certain types of cancer. This medicine may be used for other purposes; ask your health care provider or pharmacist if you have questions. COMMON BRAND NAME(S): Keytruda What should I tell my care team before I take this medication? They need to   know if you have any of these conditions: autoimmune diseases like Crohn's disease, ulcerative colitis, or lupus have had or planning to have an allogeneic stem cell transplant (uses someone else's stem cells) history of organ transplant history of chest radiation nervous system problems like myasthenia gravis or Guillain-Barre  syndrome an unusual or allergic reaction to pembrolizumab, other medicines, foods, dyes, or preservatives pregnant or trying to get pregnant breast-feeding How should I use this medication? This medicine is for infusion into a vein. It is given by a health care professional in a hospital or clinic setting. A special MedGuide will be given to you before each treatment. Be sure to read this information carefully each time. Talk to your pediatrician regarding the use of this medicine in children. While this drug may be prescribed for children as young as 6 months for selected conditions, precautions do apply. Overdosage: If you think you have taken too much of this medicine contact a poison control center or emergency room at once. NOTE: This medicine is only for you. Do not share this medicine with others. What if I miss a dose? It is important not to miss your dose. Call your doctor or health care professional if you are unable to keep an appointment. What may interact with this medication? Interactions have not been studied. This list may not describe all possible interactions. Give your health care provider a list of all the medicines, herbs, non-prescription drugs, or dietary supplements you use. Also tell them if you smoke, drink alcohol, or use illegal drugs. Some items may interact with your medicine. What should I watch for while using this medication? Your condition will be monitored carefully while you are receiving this medicine. You may need blood work done while you are taking this medicine. Do not become pregnant while taking this medicine or for 4 months after stopping it. Women should inform their doctor if they wish to become pregnant or think they might be pregnant. There is a potential for serious side effects to an unborn child. Talk to your health care professional or pharmacist for more information. Do not breast-feed an infant while taking this medicine or for 4 months after  the last dose. What side effects may I notice from receiving this medication? Side effects that you should report to your doctor or health care professional as soon as possible: allergic reactions like skin rash, itching or hives, swelling of the face, lips, or tongue bloody or black, tarry breathing problems changes in vision chest pain chills confusion constipation cough diarrhea dizziness or feeling faint or lightheaded fast or irregular heartbeat fever flushing joint pain low blood counts - this medicine may decrease the number of white blood cells, red blood cells and platelets. You may be at increased risk for infections and bleeding. muscle pain muscle weakness pain, tingling, numbness in the hands or feet persistent headache redness, blistering, peeling or loosening of the skin, including inside the mouth signs and symptoms of high blood sugar such as dizziness; dry mouth; dry skin; fruity breath; nausea; stomach pain; increased hunger or thirst; increased urination signs and symptoms of kidney injury like trouble passing urine or change in the amount of urine signs and symptoms of liver injury like dark urine, light-colored stools, loss of appetite, nausea, right upper belly pain, yellowing of the eyes or skin sweating swollen lymph nodes weight loss Side effects that usually do not require medical attention (report to your doctor or health care professional if they continue or are   bothersome): decreased appetite hair loss tiredness This list may not describe all possible side effects. Call your doctor for medical advice about side effects. You may report side effects to FDA at 1-800-FDA-1088. Where should I keep my medication? This drug is given in a hospital or clinic and will not be stored at home. NOTE: This sheet is a summary. It may not cover all possible information. If you have questions about this medicine, talk to your doctor, pharmacist, or health care  provider.  2023 Elsevier/Gold Standard (2021-05-27 00:00:00)  

## 2022-01-05 NOTE — Progress Notes (Signed)
Pt presents today for Keytruda per provider's order. Vital signs and labs WNL for treatment. Okay to proceed with treatment today.  Keytruda given today per MD orders. Tolerated infusion without adverse affects. Vital signs stable. No complaints at this time. Discharged from clinic ambulatory in stable condition. Alert and oriented x 3. F/U with [facility] as scheduled.   

## 2022-01-22 ENCOUNTER — Emergency Department (HOSPITAL_COMMUNITY): Payer: Medicare Other

## 2022-01-22 ENCOUNTER — Emergency Department (HOSPITAL_COMMUNITY)
Admission: EM | Admit: 2022-01-22 | Discharge: 2022-01-22 | Disposition: A | Discharge status: Home / Self Care | Payer: Medicare Other | Attending: Emergency Medicine | Admitting: Emergency Medicine

## 2022-01-22 ENCOUNTER — Other Ambulatory Visit: Payer: Self-pay

## 2022-01-22 ENCOUNTER — Encounter (HOSPITAL_COMMUNITY): Payer: Self-pay

## 2022-01-22 DIAGNOSIS — Z7982 Long term (current) use of aspirin: Secondary | ICD-10-CM | POA: Insufficient documentation

## 2022-01-22 DIAGNOSIS — R918 Other nonspecific abnormal finding of lung field: Secondary | ICD-10-CM | POA: Diagnosis not present

## 2022-01-22 DIAGNOSIS — U071 COVID-19: Secondary | ICD-10-CM | POA: Diagnosis not present

## 2022-01-22 DIAGNOSIS — R509 Fever, unspecified: Secondary | ICD-10-CM | POA: Diagnosis present

## 2022-01-22 DIAGNOSIS — R911 Solitary pulmonary nodule: Secondary | ICD-10-CM | POA: Diagnosis not present

## 2022-01-22 DIAGNOSIS — R059 Cough, unspecified: Secondary | ICD-10-CM | POA: Diagnosis not present

## 2022-01-22 DIAGNOSIS — R5381 Other malaise: Secondary | ICD-10-CM | POA: Diagnosis not present

## 2022-01-22 LAB — RESP PANEL BY RT-PCR (FLU A&B, COVID) ARPGX2
Influenza A by PCR: NEGATIVE
Influenza B by PCR: NEGATIVE
SARS Coronavirus 2 by RT PCR: POSITIVE — AB

## 2022-01-22 LAB — CBC WITH DIFFERENTIAL/PLATELET
Abs Immature Granulocytes: 0.01 10*3/uL (ref 0.00–0.07)
Basophils Absolute: 0 10*3/uL (ref 0.0–0.1)
Basophils Relative: 0 %
Eosinophils Absolute: 0 10*3/uL (ref 0.0–0.5)
Eosinophils Relative: 0 %
HCT: 39 % (ref 36.0–46.0)
Hemoglobin: 12.6 g/dL (ref 12.0–15.0)
Immature Granulocytes: 0 %
Lymphocytes Relative: 12 %
Lymphs Abs: 0.6 10*3/uL — ABNORMAL LOW (ref 0.7–4.0)
MCH: 30.1 pg (ref 26.0–34.0)
MCHC: 32.3 g/dL (ref 30.0–36.0)
MCV: 93.1 fL (ref 80.0–100.0)
Monocytes Absolute: 1 10*3/uL (ref 0.1–1.0)
Monocytes Relative: 20 %
Neutro Abs: 3.3 10*3/uL (ref 1.7–7.7)
Neutrophils Relative %: 68 %
Platelets: 125 10*3/uL — ABNORMAL LOW (ref 150–400)
RBC: 4.19 MIL/uL (ref 3.87–5.11)
RDW: 13.5 % (ref 11.5–15.5)
WBC: 5 10*3/uL (ref 4.0–10.5)
nRBC: 0 % (ref 0.0–0.2)

## 2022-01-22 LAB — COMPREHENSIVE METABOLIC PANEL
ALT: 17 U/L (ref 0–44)
AST: 25 U/L (ref 15–41)
Albumin: 3.8 g/dL (ref 3.5–5.0)
Alkaline Phosphatase: 58 U/L (ref 38–126)
Anion gap: 7 (ref 5–15)
BUN: 12 mg/dL (ref 8–23)
CO2: 23 mmol/L (ref 22–32)
Calcium: 8.5 mg/dL — ABNORMAL LOW (ref 8.9–10.3)
Chloride: 108 mmol/L (ref 98–111)
Creatinine, Ser: 0.73 mg/dL (ref 0.44–1.00)
GFR, Estimated: >60 mL/min (ref >60)
Glucose, Bld: 109 mg/dL — ABNORMAL HIGH (ref 70–99)
Potassium: 4.4 mmol/L (ref 3.5–5.1)
Sodium: 138 mmol/L (ref 135–145)
Total Bilirubin: 0.4 mg/dL (ref 0.3–1.2)
Total Protein: 7.2 g/dL (ref 6.5–8.1)

## 2022-01-22 MED ORDER — ACETAMINOPHEN 325 MG PO TABS
650.0000 mg | ORAL_TABLET | Freq: Once | ORAL | Status: AC
  Administered 2022-01-22: 650 mg via ORAL
  Filled 2022-01-22: qty 2

## 2022-01-22 MED ORDER — NIRMATRELVIR/RITONAVIR (PAXLOVID)TABLET
3.0000 | ORAL_TABLET | Freq: Two times a day (BID) | ORAL | Status: DC
  Administered 2022-01-22: 3 via ORAL
  Filled 2022-01-22: qty 30

## 2022-01-22 NOTE — ED Triage Notes (Signed)
Pt states that for three days she has had intermittent fevers, cough, malaise. Pt has hx of colon cancer with mets. Pt on Keytruda. States she tested positive on two home tests for COVID, last night and this morning.

## 2022-01-22 NOTE — ED Notes (Signed)
No signs of distress. Ambulatory without difficulty. Gait steady.

## 2022-01-22 NOTE — Discharge Instructions (Signed)
Take fluids and Tylenol or Motrin for aches and fever.  You have been given some Paxlovid to take also.  Follow-up with your MD as needed

## 2022-01-22 NOTE — ED Provider Notes (Signed)
[facility] EMERGENCY DEPARTMENT Provider Note   CSN: [id number] Arrival date & time: 01/22/22  0818     History  Chief Complaint  Patient presents with   URI   Fever    Amanda Norris is a 76 y.o. female.  Patient has a history of metastatic colon cancer comes in with fevers aches cough mild shortness of breath and cough  The history is provided by the patient. No language interpreter was used.  URI Presenting symptoms: cough and fever   Presenting symptoms: no congestion and no fatigue   Severity:  Mild Onset quality:  Sudden Timing:  Constant Progression:  Waxing and waning Chronicity:  New Relieved by:  Nothing Worsened by:  Nothing Ineffective treatments:  None tried Associated symptoms: no headaches   Fever Associated symptoms: cough   Associated symptoms: no chest pain, no congestion, no diarrhea, no headaches and no rash        Home Medications Prior to Admission medications   Medication Sig Start Date End Date Taking? Authorizing Provider  acetaminophen (TYLENOL) 500 MG tablet Take 500 mg by mouth every 6 (six) hours as needed for moderate pain or headache.    [provider]  aspirin 81 MG EC tablet Take 1 tablet (81 mg total) by mouth daily. Swallow whole. 04/04/18   Rehman, Mechele Dawley, MD  Bioflavonoid Products (ESTER C PO) Take 1,000 mg by mouth daily.    [provider]  Cholecalciferol (D3-1000) 25 MCG (1000 UT) capsule Take 125 Units by mouth daily.    [provider]  Cranberry-Vitamin C-Vitamin E 4200-20-3 MG-MG-UNIT CAPS Take 500 mg by mouth daily.    [provider]  Garlic 4765 MG CAPS Take 1,000 mg by mouth daily.     [provider]  lidocaine-prilocaine (EMLA) cream SMARTSIG:sparingly Topical 11/07/21   [provider]  Multiple Vitamin (MULTIVITAMIN) tablet Take 1 tablet by mouth daily.    [provider]  Omega-3 Fatty Acids (FISH OIL) 1200 MG CAPS Take 1,000 mg by mouth daily.     [provider]  OVER THE COUNTER MEDICATION Take 1,000 mg by mouth daily. [place]a 1000 mg daily    [provider]  OVER THE COUNTER MEDICATION Take 1,000 mg by mouth daily. L-Lysine '1000mg'$  po daily 02/24/21   [provider]  pramoxine-hydrocortisone (PROCTOCREAM-HC) 1-1 % rectal cream Place 1 application rectally 2 (two) times daily. 02/25/21   Derek Jack, MD  zinc gluconate 50 MG tablet Take 50 mg by mouth daily.    [provider]  prochlorperazine (COMPAZINE) 10 MG tablet Take 1 tablet (10 mg total) by mouth every 6 (six) hours as needed (NAUSEA). Patient not taking: Reported on 12/22/2020 11/09/20 01/12/21  Derek Jack, MD      Allergies    Sulfa antibiotics    Review of Systems   Review of Systems  Constitutional:  Positive for fever. Negative for appetite change and fatigue.  HENT:  Negative for congestion, ear discharge and sinus pressure.   Eyes:  Negative for discharge.  Respiratory:  Positive for cough.   Cardiovascular:  Negative for chest pain.  Gastrointestinal:  Negative for abdominal pain and diarrhea.  Genitourinary:  Negative for frequency and hematuria.  Musculoskeletal:  Negative for back pain.  Skin:  Negative for rash.  Neurological:  Negative for seizures and headaches.  Psychiatric/Behavioral:  Negative for hallucinations.     Physical Exam Updated Vital Signs BP (!) 99/48   Pulse 63   Temp  99.3 F (37.4 C) (Oral)   Resp 18   Ht '5\' 3"'$  (1.6 m)   Wt 72.6 kg   SpO2 96%   BMI 28.34 kg/m  Physical Exam Vitals and nursing note reviewed.  Constitutional:      Appearance: She is well-developed.  HENT:     Head: Normocephalic.     Nose: Nose normal.  Eyes:     General: No scleral icterus.    Conjunctiva/sclera: Conjunctivae normal.  Neck:     Thyroid: No thyromegaly.  Cardiovascular:     Rate and Rhythm: Normal rate and regular rhythm.     Heart sounds: No murmur heard.    No friction rub. No  gallop.  Pulmonary:     Breath sounds: No stridor. No wheezing or rales.  Chest:     Chest wall: No tenderness.  Abdominal:     General: There is no distension.     Tenderness: There is no abdominal tenderness. There is no rebound.  Musculoskeletal:        General: Normal range of motion.     Cervical back: Neck supple.  Lymphadenopathy:     Cervical: No cervical adenopathy.  Skin:    Findings: No erythema or rash.  Neurological:     Mental Status: She is alert and oriented to person, place, and time.     Motor: No abnormal muscle tone.     Coordination: Coordination normal.  Psychiatric:        Behavior: Behavior normal.     ED Results / Procedures / Treatments   Labs (all labs ordered are listed, but only abnormal results are displayed) Labs Reviewed  RESP PANEL BY RT-PCR (FLU A&B, COVID) ARPGX2 - Abnormal; Notable for the following components:      Result Value   SARS Coronavirus 2 by RT PCR POSITIVE (*)    All other components within normal limits  CBC WITH DIFFERENTIAL/PLATELET - Abnormal; Notable for the following components:   Platelets 125 (*)    Lymphs Abs 0.6 (*)    All other components within normal limits  COMPREHENSIVE METABOLIC PANEL - Abnormal; Notable for the following components:   Glucose, Bld 109 (*)    Calcium 8.5 (*)    All other components within normal limits    EKG None  Radiology DG Chest Port 1 View  Result Date: 01/22/2022 CLINICAL DATA:  Fever, cough, and malaise for 3 days. Metastatic colon carcinoma. EXAM: PORTABLE CHEST 1 VIEW COMPARISON:  09/18/2016 FINDINGS: Stable mild cardiomegaly. Right-sided power port is seen in appropriate position. Pulmonary hyperinflation again noted. Numerous small bilateral pulmonary nodules are new since previous study and consistent with pulmonary metastases. No evidence of pulmonary consolidation or pleural effusion. IMPRESSION: Numerous small bilateral pulmonary nodules, consistent with pulmonary  metastases. Mild cardiomegaly. Electronically Signed   By: Marlaine Hind M.D.   On: 01/22/2022 09:42    Procedures Procedures    Medications Ordered in ED Medications  nirmatrelvir/ritonavir EUA (PAXLOVID) 3 tablet (has no administration in time range)  acetaminophen (TYLENOL) tablet 650 mg (650 mg Oral Given 01/22/22 4696)    ED Course/ Medical Decision Making/ A&P                           Medical Decision Making Amount and/or Complexity of Data Reviewed Labs: ordered. Radiology: ordered.  Risk OTC drugs.  This patient presents to the ED for concern of fever chills cough, this involves an extensive number  of treatment options, and is a complaint that carries with it a high risk of complications and morbidity.  The differential diagnosis includes mono, COVID   Co morbidities that complicate the patient evaluation Large intestine CA  Additional history obtained:  Additional history obtained from family External records from outside source obtained and reviewed including hospital record   Lab Tests:  I Ordered, and personally interpreted labs.  The pertinent results include: CBC and chemistries unremarkable   Imaging Studies ordered:  I ordered imaging studies including chest x-ray I independently visualized and interpreted imaging which showed pulmonary mets I agree with the radiologist interpretation   Cardiac Monitoring: / EKG:  The patient was maintained on a cardiac monitor.  I personally viewed and interpreted the cardiac monitored which showed an underlying rhythm of: Normal sinus rhythm   Consultations Obtained:  No consultant Problem List / ED Course / Critical interventions / Medication management  Large intestine cancer and COVID I ordered medication including paxlovid for COVID Reevaluation of the patient after these medicines showed that the patient stayed the same I have reviewed the patients home medicines and have made adjustments as  needed   Social Determinants of Health:  Colon cancer   Test / Admission - Considered:  No additional test needed  Patient with COVID infection.  She is put on pack COVID and will follow-up with her doctor        Final Clinical Impression(s) / ED Diagnoses Final diagnoses:  COVID    Rx / DC Orders ED Discharge Orders     None         Milton Ferguson, MD 01/22/22 1718

## 2022-01-23 ENCOUNTER — Telehealth (HOSPITAL_COMMUNITY): Payer: Self-pay | Admitting: *Deleted

## 2022-01-23 ENCOUNTER — Ambulatory Visit (HOSPITAL_COMMUNITY): Payer: Medicare Other

## 2022-01-23 NOTE — Telephone Encounter (Signed)
Patient called to inform that she has tested positive for COVID and is currently on Paxlovid treatment.  Advised that CT for today and upcoming treatment will be pushed out 2 weeks and she will receive a call with new appointments.  She will call our office if anything else if needed.  Dr. Delton Coombes made aware.

## 2022-01-26 ENCOUNTER — Ambulatory Visit (HOSPITAL_COMMUNITY): Payer: Medicare Other | Admitting: Hematology

## 2022-01-26 ENCOUNTER — Other Ambulatory Visit (HOSPITAL_COMMUNITY): Payer: Medicare Other

## 2022-01-26 ENCOUNTER — Ambulatory Visit (HOSPITAL_COMMUNITY): Payer: Medicare Other

## 2022-01-30 ENCOUNTER — Other Ambulatory Visit: Payer: Self-pay

## 2022-02-02 ENCOUNTER — Other Ambulatory Visit: Payer: Self-pay

## 2022-02-06 ENCOUNTER — Ambulatory Visit (HOSPITAL_COMMUNITY)
Admission: RE | Admit: 2022-02-06 | Discharge: 2022-02-06 | Disposition: A | Discharge status: Home / Self Care | Payer: Medicare Other | Source: Ambulatory Visit | Attending: Hematology | Admitting: Hematology

## 2022-02-06 ENCOUNTER — Inpatient Hospital Stay (HOSPITAL_COMMUNITY): Payer: Medicare Other | Attending: Hematology

## 2022-02-06 DIAGNOSIS — C189 Malignant neoplasm of colon, unspecified: Secondary | ICD-10-CM | POA: Diagnosis not present

## 2022-02-06 DIAGNOSIS — K7689 Other specified diseases of liver: Secondary | ICD-10-CM | POA: Diagnosis not present

## 2022-02-06 DIAGNOSIS — R918 Other nonspecific abnormal finding of lung field: Secondary | ICD-10-CM | POA: Diagnosis not present

## 2022-02-06 DIAGNOSIS — Z79899 Other long term (current) drug therapy: Secondary | ICD-10-CM | POA: Insufficient documentation

## 2022-02-06 DIAGNOSIS — R590 Localized enlarged lymph nodes: Secondary | ICD-10-CM | POA: Diagnosis not present

## 2022-02-06 DIAGNOSIS — E064 Drug-induced thyroiditis: Secondary | ICD-10-CM

## 2022-02-06 DIAGNOSIS — K6389 Other specified diseases of intestine: Secondary | ICD-10-CM | POA: Diagnosis not present

## 2022-02-06 DIAGNOSIS — C184 Malignant neoplasm of transverse colon: Secondary | ICD-10-CM | POA: Insufficient documentation

## 2022-02-06 DIAGNOSIS — K449 Diaphragmatic hernia without obstruction or gangrene: Secondary | ICD-10-CM | POA: Diagnosis not present

## 2022-02-06 LAB — COMPREHENSIVE METABOLIC PANEL
ALT: 20 U/L (ref 0–44)
AST: 25 U/L (ref 15–41)
Albumin: 4.5 g/dL (ref 3.5–5.0)
Alkaline Phosphatase: 63 U/L (ref 38–126)
Anion gap: 7 (ref 5–15)
BUN: 13 mg/dL (ref 8–23)
CO2: 28 mmol/L (ref 22–32)
Calcium: 9.4 mg/dL (ref 8.9–10.3)
Chloride: 104 mmol/L (ref 98–111)
Creatinine, Ser: 0.71 mg/dL (ref 0.44–1.00)
GFR, Estimated: >60 mL/min (ref >60)
Glucose, Bld: 116 mg/dL — ABNORMAL HIGH (ref 70–99)
Potassium: 3.9 mmol/L (ref 3.5–5.1)
Sodium: 139 mmol/L (ref 135–145)
Total Bilirubin: 0.6 mg/dL (ref 0.3–1.2)
Total Protein: 8.4 g/dL — ABNORMAL HIGH (ref 6.5–8.1)

## 2022-02-06 LAB — CBC WITH DIFFERENTIAL/PLATELET
Abs Immature Granulocytes: 0.02 10*3/uL (ref 0.00–0.07)
Basophils Absolute: 0 10*3/uL (ref 0.0–0.1)
Basophils Relative: 1 %
Eosinophils Absolute: 0.1 10*3/uL (ref 0.0–0.5)
Eosinophils Relative: 1 %
HCT: 41.5 % (ref 36.0–46.0)
Hemoglobin: 13.8 g/dL (ref 12.0–15.0)
Immature Granulocytes: 0 %
Lymphocytes Relative: 17 %
Lymphs Abs: 1 10*3/uL (ref 0.7–4.0)
MCH: 30.7 pg (ref 26.0–34.0)
MCHC: 33.3 g/dL (ref 30.0–36.0)
MCV: 92.2 fL (ref 80.0–100.0)
Monocytes Absolute: 0.6 10*3/uL (ref 0.1–1.0)
Monocytes Relative: 9 %
Neutro Abs: 4.4 10*3/uL (ref 1.7–7.7)
Neutrophils Relative %: 72 %
Platelets: 197 10*3/uL (ref 150–400)
RBC: 4.5 MIL/uL (ref 3.87–5.11)
RDW: 13.5 % (ref 11.5–15.5)
WBC: 6.1 10*3/uL (ref 4.0–10.5)
nRBC: 0 % (ref 0.0–0.2)

## 2022-02-06 LAB — TSH: TSH: 1.619 u[IU]/mL (ref 0.350–4.500)

## 2022-02-06 LAB — MAGNESIUM: Magnesium: 2.3 mg/dL (ref 1.7–2.4)

## 2022-02-06 MED ORDER — IOHEXOL 300 MG/ML  SOLN
100.0000 mL | Freq: Once | INTRAMUSCULAR | Status: AC | PRN
  Administered 2022-02-06: 100 mL via INTRAVENOUS

## 2022-02-07 LAB — CEA: CEA: 5.3 ng/mL — ABNORMAL HIGH (ref 0.0–4.7)

## 2022-02-08 ENCOUNTER — Inpatient Hospital Stay: Payer: Medicare Other | Attending: Hematology

## 2022-02-08 ENCOUNTER — Inpatient Hospital Stay (HOSPITAL_BASED_OUTPATIENT_CLINIC_OR_DEPARTMENT_OTHER): Payer: Medicare Other | Admitting: Hematology

## 2022-02-08 VITALS — BP 152/75 | HR 69 | Temp 97.3°F | Resp 18 | Ht 63.25 in | Wt 156.6 lb

## 2022-02-08 VITALS — BP 135/56 | HR 59 | Temp 98.0°F | Resp 18

## 2022-02-08 DIAGNOSIS — C787 Secondary malignant neoplasm of liver and intrahepatic bile duct: Secondary | ICD-10-CM | POA: Diagnosis not present

## 2022-02-08 DIAGNOSIS — C184 Malignant neoplasm of transverse colon: Secondary | ICD-10-CM | POA: Diagnosis not present

## 2022-02-08 DIAGNOSIS — C778 Secondary and unspecified malignant neoplasm of lymph nodes of multiple regions: Secondary | ICD-10-CM | POA: Diagnosis not present

## 2022-02-08 DIAGNOSIS — Z79899 Other long term (current) drug therapy: Secondary | ICD-10-CM | POA: Diagnosis not present

## 2022-02-08 DIAGNOSIS — Z5112 Encounter for antineoplastic immunotherapy: Secondary | ICD-10-CM | POA: Insufficient documentation

## 2022-02-08 DIAGNOSIS — C7800 Secondary malignant neoplasm of unspecified lung: Secondary | ICD-10-CM | POA: Insufficient documentation

## 2022-02-08 DIAGNOSIS — Z95828 Presence of other vascular implants and grafts: Secondary | ICD-10-CM

## 2022-02-08 MED ORDER — SODIUM CHLORIDE 0.9 % IV SOLN: Freq: Once | INTRAVENOUS | Status: AC

## 2022-02-08 MED ORDER — SODIUM CHLORIDE 0.9% FLUSH
10.0000 mL | INTRAVENOUS | Status: DC | PRN
  Administered 2022-02-08: 10 mL

## 2022-02-08 MED ORDER — SODIUM CHLORIDE 0.9 % IV SOLN
200.0000 mg | Freq: Once | INTRAVENOUS | Status: AC
  Administered 2022-02-08: 200 mg via INTRAVENOUS
  Filled 2022-02-08: qty 8

## 2022-02-08 MED ORDER — HEPARIN SOD (PORK) LOCK FLUSH 100 UNIT/ML IV SOLN
500.0000 [IU] | Freq: Once | INTRAVENOUS | Status: AC | PRN
  Administered 2022-02-08: 500 [IU]

## 2022-02-08 NOTE — Patient Instructions (Signed)
[facility]  Discharge Instructions: Thank you for choosing [facility] to provide your oncology and hematology care.  If you have a lab appointment with the [facility], please come in thru the Main Entrance and check in at the main information desk.  Wear comfortable clothing and clothing appropriate for easy access to any Portacath or PICC line.   We strive to give you quality time with your provider. You may need to reschedule your appointment if you arrive late (15 or more minutes).  Arriving late affects you and other patients whose appointments are after yours.  Also, if you miss three or more appointments without notifying the office, you may be dismissed from the clinic at the provider's discretion.      For prescription refill requests, have your pharmacy contact our office and allow 72 hours for refills to be completed.    Today you received the following chemotherapy and/or immunotherapy agents keytruda.  Pembrolizumab injection What is this medication? PEMBROLIZUMAB (pem broe liz ue mab) is a monoclonal antibody. It is used to treat certain types of cancer. This medicine may be used for other purposes; ask your health care provider or pharmacist if you have questions. COMMON BRAND NAME(S): Keytruda What should I tell my care team before I take this medication? They need to know if you have any of these conditions: autoimmune diseases like Crohn's disease, ulcerative colitis, or lupus have had or planning to have an allogeneic stem cell transplant (uses someone else's stem cells) history of organ transplant history of chest radiation nervous system problems like myasthenia gravis or Guillain-Barre syndrome an unusual or allergic reaction to pembrolizumab, other medicines, foods, dyes, or preservatives pregnant or trying to get pregnant breast-feeding How should I use this medication? This medicine is for infusion into a vein. It is given by  a health care professional in a hospital or clinic setting. A special MedGuide will be given to you before each treatment. Be sure to read this information carefully each time. Talk to your pediatrician regarding the use of this medicine in children. While this drug may be prescribed for children as young as 6 months for selected conditions, precautions do apply. Overdosage: If you think you have taken too much of this medicine contact a poison control center or emergency room at once. NOTE: This medicine is only for you. Do not share this medicine with others. What if I miss a dose? It is important not to miss your dose. Call your doctor or health care professional if you are unable to keep an appointment. What may interact with this medication? Interactions have not been studied. This list may not describe all possible interactions. Give your health care provider a list of all the medicines, herbs, non-prescription drugs, or dietary supplements you use. Also tell them if you smoke, drink alcohol, or use illegal drugs. Some items may interact with your medicine. What should I watch for while using this medication? Your condition will be monitored carefully while you are receiving this medicine. You may need blood work done while you are taking this medicine. Do not become pregnant while taking this medicine or for 4 months after stopping it. Women should inform their doctor if they wish to become pregnant or think they might be pregnant. There is a potential for serious side effects to an unborn child. Talk to your health care professional or pharmacist for more information. Do not breast-feed an infant while taking this medicine or  for 4 months after the last dose. What side effects may I notice from receiving this medication? Side effects that you should report to your doctor or health care professional as soon as possible: allergic reactions like skin rash, itching or hives, swelling of the face,  lips, or tongue bloody or black, tarry breathing problems changes in vision chest pain chills confusion constipation cough diarrhea dizziness or feeling faint or lightheaded fast or irregular heartbeat fever flushing joint pain low blood counts - this medicine may decrease the number of white blood cells, red blood cells and platelets. You may be at increased risk for infections and bleeding. muscle pain muscle weakness pain, tingling, numbness in the hands or feet persistent headache redness, blistering, peeling or loosening of the skin, including inside the mouth signs and symptoms of high blood sugar such as dizziness; dry mouth; dry skin; fruity breath; nausea; stomach pain; increased hunger or thirst; increased urination signs and symptoms of kidney injury like trouble passing urine or change in the amount of urine signs and symptoms of liver injury like dark urine, light-colored stools, loss of appetite, nausea, right upper belly pain, yellowing of the eyes or skin sweating swollen lymph nodes weight loss Side effects that usually do not require medical attention (report to your doctor or health care professional if they continue or are bothersome): decreased appetite hair loss tiredness This list may not describe all possible side effects. Call your doctor for medical advice about side effects. You may report side effects to FDA at 1-800-FDA-1088. Where should I keep my medication? This drug is given in a hospital or clinic and will not be stored at home. NOTE: This sheet is a summary. It may not cover all possible information. If you have questions about this medicine, talk to your doctor, pharmacist, or health care provider.  2023 Elsevier/Gold Standard (2021-05-27 00:00:00)       To help prevent nausea and vomiting after your treatment, we encourage you to take your nausea medication as directed.  BELOW ARE SYMPTOMS THAT SHOULD BE REPORTED IMMEDIATELY: *FEVER  GREATER THAN 100.4 F (38 C) OR HIGHER *CHILLS OR SWEATING *NAUSEA AND VOMITING THAT IS NOT CONTROLLED WITH YOUR NAUSEA MEDICATION *UNUSUAL SHORTNESS OF BREATH *UNUSUAL BRUISING OR BLEEDING *URINARY PROBLEMS (pain or burning when urinating, or frequent urination) *BOWEL PROBLEMS (unusual diarrhea, constipation, pain near the anus) TENDERNESS IN MOUTH AND THROAT WITH OR WITHOUT PRESENCE OF ULCERS (sore throat, sores in mouth, or a toothache) UNUSUAL RASH, SWELLING OR PAIN  UNUSUAL VAGINAL DISCHARGE OR ITCHING   Items with * indicate a potential emergency and should be followed up as soon as possible or go to the Emergency Department if any problems should occur.  Please show the CHEMOTHERAPY ALERT CARD or IMMUNOTHERAPY ALERT CARD at check-in to the Emergency Department and triage nurse.  Should you have questions after your visit or need to cancel or reschedule your appointment, please contact [facility] [phone]  and follow the prompts.  Office hours are 8:00 a.m. to 4:30 p.m. Monday - Friday. Please note that voicemails left after 4:00 p.m. may not be returned until the following business day.  We are closed weekends and major holidays. You have access to a nurse at all times for urgent questions. Please call the main number to the clinic [phone] and follow the prompts.  For any non-urgent questions, you may also contact your provider using MyChart. We now offer e-Visits for anyone 69 and older to request care  online for non-urgent symptoms. For details visit [email or web address].   Also download the MyChart app! Go to the app store, search "MyChart", open the app, select [facility], and log in with your MyChart username and password.  Masks are optional in the cancer centers. If you would like for your care team to wear a mask while they are taking care of you, please let them know. For doctor visits, patients may have with them one support person who is at  least 76 years old. At this time, visitors are not allowed in the infusion area.

## 2022-02-08 NOTE — Progress Notes (Signed)
[place], [place] [address]   CLINIC:  Medical Oncology/Hematology  PCP:  [facility], [place] / [place] [address] [phone]   REASON FOR VISIT:  Follow-up for transverse colon adenocarcinoma  PRIOR THERAPY:  1. Right hemicolectomy on 12/29/2013. 2. FOLFOX x 3 cycles from 02/10/2014 to 03/10/2014, stopped secondary to poor tolerance  NGS Results: MSI high, MMR deficient, BRAF V600E, TMB high  CURRENT THERAPY: Keytruda every 3 weeks  BRIEF ONCOLOGIC HISTORY:  Oncology History  Adenocarcinoma of transverse colon ([place]) (Resolved)  12/09/2013 Initial Diagnosis   1. Colon, biopsy, proximal transverse mass INVASIVE ADENOCARCINOMA.   12/29/2013 Definitive Surgery   Colon, segmental resection for tumor, right - INVASIVE COLORECTAL ADENOCARCINOMA, 7 CM, EXTENDING INTO PERICOLONIC CONNECTIVE TISSUE. - METASTATIC CARCINOMA IN 6 OF 33 LYMPH NODES (6/33).   02/10/2014 - 03/10/2014 Adjuvant Chemotherapy   FOLFOX x 3 cycles   03/11/2014 Adverse Reaction   Patient called reporting diarrhea despite dose reduction and she wants to stop therapy.  Patient seen on 03/24/14 to discuss other treatment options and she stands by her decision to stop all therapy.   04/01/2014 Surgery   Port-a-cath removal by Dr. Arnoldo Morale.   01/12/2015 PET scan   Mild abdominal mesenteric LAD show hypermetabolic activity, 7 mm indeterminate pulm nodule in sup segment of RLL shows no assoc metabolic activity   9/52/8413 PET scan   Mild progression of nodal mets in anterior mid abdominal mesentery, new nodal mets at L thoracic inlet. stable 6 mm nodule in posterior RLL, unchanged since 2015   06/22/2015 PET scan   Resolution of metabolic activity and decreased size of mild LAD at L thoracic inlet, stable mild mid abd hypermetabolic mesenteric LAD, stable 6 mm posterior LLL pulm nodule without metabolic activity   2/44/0102 Imaging   MRI lumbar spine L4-L5 disc  degenerated, broad based disc hernation, B facet arthropathy with hypertophy and edema, stenosis of lateral recesses that could cause neural compression   10/18/2016 Imaging   CT CAP- 1. Several small ground-glass pulmonary nodules are present in the lungs and are stable over the past 9 months, but several are mildly larger than they were in 2015. Low-grade adenocarcinoma can sometimes have this appearance and surveillance is likely warranted. 2. There is a cluster of nodal tissue in the central mesentery, maximum short axis diameter 1.3 cm. This nodal cluster is relatively low in density and not appreciably changed from the prior exam. Given the lack of change is may represent quiescent residua of malignancy, but again, surveillance is likely warranted. 3. The left-sided rib fractures are more numerous than was revealed on the prior radiographs ; there are nondisplaced healing fractures of the left fifth, sixth, seventh, eighth, ninth, and tenth ribs. 4. Mild cardiomegaly although part of this appearance may be due to pectus excavatum. 5. Several additional small pulmonary nodules are stable from 2015 and considered benign. 6. Right hemicolectomy and sigmoid colon diverticulosis. 7. Small right paraumbilical hernia containing adipose tissue. 8. Lower lumbar spondylosis and degenerative disc disease potentially with mild impingement at L4-5.   04/25/2017 Imaging   CT C/A/P: Scattered solid pulmonary nodules measure up to 6 mm in the right lower lobe, unchanged. There are a few scattered ground-glass nodules measuring up to 8 mm in the right upper lobe, also stable. Distal perigastric lymph nodes are stable. No additional evidence of metastatic disease.   Metastatic colon cancer to liver ([place])  11/04/2020 Initial Diagnosis  Metastatic colon cancer to liver [facility])   11/10/2020 - 12/10/2020 Chemotherapy         01/13/2021 -  Chemotherapy   Patient is on Treatment Plan : COLORECTAL  Pembrolizumab q21d       CANCER STAGING:  Cancer Staging  No matching staging information was found for the patient.  INTERVAL HISTORY:  Amanda Norris, a 76 y.o. female, returns for routine follow-up and consideration for next cycle of chemotherapy. Amanda Norris was last seen on 12/15/2021.  Due for cycle #19 of Keytruda today.   Overall, she tells me she has been feeling pretty well. Her diarrhea is stable, and she takes Imodium prn. She denies abdominal pain. She reports fatigue for 1-2 day following treatment. She denies headaches and vision changes. She also denies light headedness. She reports occasional ankle swellings at night. She is able to do her typical daily activities.   Overall, she feels ready for next cycle of chemo today.   REVIEW OF SYSTEMS:  Review of Systems  Constitutional:  Positive for fatigue. Negative for appetite change.  Eyes:  Negative for eye problems.  Cardiovascular:  Positive for leg swelling (occasional at night).  Gastrointestinal:  Positive for diarrhea (stable). Negative for abdominal pain.  Neurological:  Negative for headaches and light-headedness.  All other systems reviewed and are negative.   PAST MEDICAL/SURGICAL HISTORY:  Past Medical History:  Diagnosis Date   Arthritis    wrist and thumbs   Colon cancer (HCC)    PONV (postoperative nausea and vomiting)    Port-A-Cath in place 11/09/2020   Ruptured lumbar disc    L1-L5 per patient report   Sciatica of left side    Past Surgical History:  Procedure Laterality Date   COLONOSCOPY N/A 12/12/2013   Procedure: COLONOSCOPY;  Surgeon: Rogene Houston, MD;  [facility];  Service: Endoscopy;  Laterality: N/A;  730   COLONOSCOPY N/A 01/20/2015   Procedure: COLONOSCOPY;  Surgeon: Rogene Houston, MD;  [facility]: [facility];  Service: Endoscopy;  Laterality: N/A;  1030   COLONOSCOPY N/A 03/28/2018   Procedure: COLONOSCOPY;  Surgeon: Rogene Houston, MD;  Location: [facility];   Service: Endoscopy;  Laterality: N/A;  1015   EUS N/A 12/18/2013   Procedure: UPPER ENDOSCOPIC ULTRASOUND (EUS) LINEAR;  Surgeon: Milus Banister, MD;  Location: [facility];  Service: Endoscopy;  Laterality: N/A;   herniated disc     1996-[id number]   IR IMAGING GUIDED PORT INSERTION  10/14/2020   IR US GUIDANCE  10/14/2020   PARTIAL COLECTOMY N/A 12/29/2013   Procedure: RIGHT HEMICOLECTOMY;  Surgeon: Jamesetta So, MD;  Location: [facility];  Service: General;  Laterality: N/A;   POLYPECTOMY  03/28/2018   Procedure: POLYPECTOMY;  Surgeon: Rogene Houston, MD;  [facility];  Service: Endoscopy;;  transverse colon (hot snare);   PORT-A-CATH REMOVAL Right 04/01/2014   Procedure: MINOR REMOVAL PORT-A-CATH;  Surgeon: Jamesetta So, MD;  Location: [facility];  Service: General;  Laterality: Right;   PORTACATH PLACEMENT Right 02/04/2014   Procedure: INSERTION PORT-A-CATH;  Surgeon: Jamesetta So, MD;  Location: [facility];  Service: General;  Laterality: Right;    SOCIAL HISTORY:  Social History   Socioeconomic History   Marital status: Married    Spouse name: Not on file   Number of children: Not on file   Years of education: Not on file   Highest education level: Not on file  Occupational History  Not on file  Tobacco Use   Smoking status: Former    Packs/day: 0.25    Years: 5.00    Total pack years: 1.25    Types: Cigarettes    Quit date: 04/19/1959    Years since quitting: 62.8   Smokeless tobacco: Never   Tobacco comments:    smoked for 5 years as teenager  Vaping Use   Vaping Use: Never used  Substance and Sexual Activity   Alcohol use: No   Drug use: No   Sexual activity: Yes    Birth control/protection: Post-menopausal  Other Topics Concern   Not on file  Social History Narrative   Not on file   Social Determinants of Health   Financial Resource Strain: Low Risk  (10/04/2020)   Overall Financial Resource Strain (CARDIA)    Difficulty of Paying Living Expenses: Not  hard at all  Food Insecurity: No Food Insecurity (10/04/2020)   Hunger Vital Sign    Worried About Running Out of Food in the Last Year: Never true    Ran Out of Food in the Last Year: Never true  Transportation Needs: No Transportation Needs (10/04/2020)   PRAPARE - Administrator, Civil Service (Medical): No    Lack of Transportation (Non-Medical): No  Physical Activity: Inactive (10/04/2020)   Exercise Vital Sign    Days of Exercise per Week: 0 days    Minutes of Exercise per Session: 0 min  Stress: Stress Concern Present (10/04/2020)   [employer] of Occupational Health - Occupational Stress Questionnaire    Feeling of Stress : To some extent  Social Connections: Socially Integrated (10/04/2020)   Social Connection and Isolation Panel [NHANES]    Frequency of Communication with Friends and Family: More than three times a week    Frequency of Social Gatherings with Friends and Family: More than three times a week    Attends Religious Services: More than 4 times per year    Active Member of [employer] or Organizations: Yes    Attends Engineer, structural: More than 4 times per year    Marital Status: Married  Catering manager Violence: Not At Risk (10/04/2020)   Humiliation, Afraid, Rape, and Kick questionnaire    Fear of Current or Ex-Partner: No    Emotionally Abused: No    Physically Abused: No    Sexually Abused: No    FAMILY HISTORY:  Family History  Problem Relation Age of Onset   Diabetes type II Mother    Dementia Father     CURRENT MEDICATIONS:  Current Outpatient Medications  Medication Sig Dispense Refill   acetaminophen (TYLENOL) 500 MG tablet Take 500 mg by mouth every 6 (six) hours as needed for moderate pain or headache.     aspirin 81 MG EC tablet Take 1 tablet (81 mg total) by mouth daily. Swallow whole. 30 tablet 12   Bioflavonoid Products (ESTER C PO) Take 1,000 mg by mouth daily.     Cholecalciferol (D3-1000) 25 MCG (1000 UT) capsule  Take 125 Units by mouth daily.     Cranberry-Vitamin C-Vitamin E 4200-20-3 MG-MG-UNIT CAPS Take 500 mg by mouth daily.     Garlic 1000 MG CAPS Take 1,000 mg by mouth daily.      lidocaine-prilocaine (EMLA) cream SMARTSIG:sparingly Topical     Multiple Vitamin (MULTIVITAMIN) tablet Take 1 tablet by mouth daily.     Omega-3 Fatty Acids (FISH OIL) 1200 MG CAPS Take 1,000 mg by mouth daily.  OVER THE COUNTER MEDICATION Take 1,000 mg by mouth daily. [place] 1000 mg daily     OVER THE COUNTER MEDICATION Take 1,000 mg by mouth daily. L-Lysine $RemoveBeforeD'1000mg'HaHBaJaHSgVVzz$  po daily     pramoxine-hydrocortisone (PROCTOCREAM-HC) 1-1 % rectal cream Place 1 application rectally 2 (two) times daily. 30 g 0   zinc gluconate 50 MG tablet Take 50 mg by mouth daily.     No current facility-administered medications for this visit.    ALLERGIES:  Allergies  Allergen Reactions   Sulfa Antibiotics Other (See Comments)    "Sores in my mouth"    PHYSICAL EXAM:  Performance status (ECOG): 1 - Symptomatic but completely ambulatory  There were no vitals filed for this visit. Wt Readings from Last 3 Encounters:  01/22/22 160 lb (72.6 kg)  01/05/22 158 lb 4.6 oz (71.8 kg)  12/15/21 157 lb 14.4 oz (71.6 kg)   Physical Exam Vitals reviewed.  Constitutional:      Appearance: Normal appearance.  Cardiovascular:     Rate and Rhythm: Normal rate and regular rhythm.     Pulses: Normal pulses.     Heart sounds: Normal heart sounds.  Pulmonary:     Effort: Pulmonary effort is normal.     Breath sounds: Normal breath sounds.  Neurological:     General: No focal deficit present.     Mental Status: She is alert and oriented to person, place, and time.  Psychiatric:        Mood and Affect: Mood normal.        Behavior: Behavior normal.     LABORATORY DATA:  I have reviewed the labs as listed.     Latest Ref Rng & Units 02/06/2022    9:21 AM 01/22/2022    9:11 AM 01/05/2022   12:44 PM  CBC  WBC 4.0 - 10.5 K/uL 6.1  5.0   6.4   Hemoglobin 12.0 - 15.0 g/dL 13.8  12.6  13.4   Hematocrit 36.0 - 46.0 % 41.5  39.0  41.5   Platelets 150 - 400 K/uL 197  125  171       Latest Ref Rng & Units 02/06/2022    9:21 AM 01/22/2022    9:11 AM 01/05/2022   12:44 PM  CMP  Glucose 70 - 99 mg/dL 116  109  97   BUN 8 - 23 mg/dL $Remove'13  12  11   'MdXRcqm$ Creatinine 0.44 - 1.00 mg/dL 0.71  0.73  0.61   Sodium 135 - 145 mmol/L 139  138  139   Potassium 3.5 - 5.1 mmol/L 3.9  4.4  4.1   Chloride 98 - 111 mmol/L 104  108  106   CO2 22 - 32 mmol/L $RemoveB'28  23  26   'OxIhlALF$ Calcium 8.9 - 10.3 mg/dL 9.4  8.5  9.3   Total Protein 6.5 - 8.1 g/dL 8.4  7.2  7.8   Total Bilirubin 0.3 - 1.2 mg/dL 0.6  0.4  0.5   Alkaline Phos 38 - 126 U/L 63  58  60   AST 15 - 41 U/L $Remo'25  25  22   'KxefO$ ALT 0 - 44 U/L $Remo'20  17  16     'jIisX$ DIAGNOSTIC IMAGING:  I have independently reviewed the scans and discussed with the patient. CT CHEST ABDOMEN PELVIS W CONTRAST  Result Date: 02/06/2022 CLINICAL DATA:  Metastatic colon cancer status post right hemicolectomy ongoing chemotherapy * Tracking Code: BO * EXAM: CT CHEST, ABDOMEN, AND PELVIS WITH CONTRAST  TECHNIQUE: Multidetector CT imaging of the chest, abdomen and pelvis was performed following the standard protocol during bolus administration of intravenous contrast. RADIATION DOSE REDUCTION: This exam was performed according to the departmental dose-optimization program which includes automated exposure control, adjustment of the mA and/or kV according to patient size and/or use of iterative reconstruction technique. CONTRAST:  129mL OMNIPAQUE IOHEXOL 300 MG/ML SOLN, additional oral enteric contrast COMPARISON:  09/29/2021 FINDINGS: CT CHEST FINDINGS Cardiovascular: Right chest port catheter. Normal heart size. No pericardial effusion. Mediastinum/Nodes: Unchanged enlarged, matted appearing mediastinal and right hilar lymph nodes, largest pretracheal node measuring 2.0 x 1.8 cm (series 2, image 25). Unchanged enlarged low right paraesophageal lymph  node measuring 2.7 x 1.8 cm (series 2, image 53). Small hiatal hernia. Thyroid gland, trachea, and esophagus demonstrate no significant findings. Lungs/Pleura: Numerous small bilateral pulmonary nodules, unchanged, index nodule of the right upper lobe measuring 1.1 x 0.9 cm (series 4, image 86), index nodule of the dependent left lower lobe measuring 0.8 x 0.8 cm (series 4, image 26). No pleural effusion or pneumothorax. Musculoskeletal: No chest wall mass or suspicious osseous lesions identified. Pectus deformity. CT ABDOMEN PELVIS FINDINGS Hepatobiliary: No solid liver abnormality is seen. Simple, benign low attenuation cyst of the anterior liver (series 2, image 61). No gallstones, gallbladder wall thickening, or biliary dilatation. Pancreas: Unremarkable. No pancreatic ductal dilatation or surrounding inflammatory changes. Spleen: Normal in size without significant abnormality. Adrenals/Urinary Tract: Adrenal glands are unremarkable. Kidneys are normal, without renal calculi, solid lesion, or hydronephrosis. Bladder is unremarkable. Stomach/Bowel: Stomach is within normal limits. Status post right hemicolectomy and ileocolic anastomosis. No evidence of bowel wall thickening, distention, or inflammatory changes. Sigmoid diverticula. Vascular/Lymphatic: No significant vascular findings are present. Unchanged, bulky, matted retroperitoneal small bowel mesentery lymph nodes, largest left retroperitoneal node measuring 2.6 x 1.7 cm (series 2, image 71). Unchanged lymph node conglomerate and or soft tissue nodule in the central small bowel mesentery, encasing the proximal superior mesenteric artery, measuring 5.8 x 4.5 cm (series 2, image 76). Reproductive: No mass. Prominent bilateral uterine and adnexal varices (series 2, image 108). The left ovarian vein measures up to 1.2 cm in caliber. Other: No abdominal wall hernia or abnormality. No ascites. Musculoskeletal: No acute osseous findings. IMPRESSION: 1. Status  post right hemicolectomy and ileocolic anastomosis. 2. Numerous unchanged pulmonary nodules. 3. Unchanged bulky, matted appearing mediastinal, retroperitoneal, and mesenteric lymphadenopathy. 4. Unchanged lymph node conglomerate or soft tissue mass encasing the proximal superior mesenteric artery. 5. Constellation of findings is consistent with unchanged metastatic disease. 6. Prominent bilateral uterine and adnexal varices. The left ovarian vein measures up to 1.2 cm in caliber. These findings can be seen in pelvic congestion for referable signs and symptoms are present. Electronically Signed   By: Delanna Ahmadi M.D.   On: 02/06/2022 13:16   DG Chest Port 1 View  Result Date: 01/22/2022 CLINICAL DATA:  Fever, cough, and malaise for 3 days. Metastatic colon carcinoma. EXAM: PORTABLE CHEST 1 VIEW COMPARISON:  09/18/2016 FINDINGS: Stable mild cardiomegaly. Right-sided power port is seen in appropriate position. Pulmonary hyperinflation again noted. Numerous small bilateral pulmonary nodules are new since previous study and consistent with pulmonary metastases. No evidence of pulmonary consolidation or pleural effusion. IMPRESSION: Numerous small bilateral pulmonary nodules, consistent with pulmonary metastases. Mild cardiomegaly. Electronically Signed   By: Marlaine Hind M.D.   On: 01/22/2022 09:42     ASSESSMENT:  1.  Stage IIIb (PT3PN2A) adenocarcinoma the proximal transverse colon: - Status post colectomy on  12/29/2013, 6/33+ lymph nodes, free margins, grade 2, no lymphovascular or perineural invasion. - Status post 3 cycles of FOLFOX from 02/10/2014 through 03/10/2014, discontinued secondary to poor tolerance.  DPD was negative. -Last colonoscopy on 03/28/2018 patent ileo-colonic anastomosis.  8 mm polyp in the transverse colon.  Diverticulosis in the sigmoid colon, descending colon. - CT scan on 11/04/2018 shows postoperative findings of right colectomy with redemonstrated mesenteric nodule/lymph node  measuring 1.9 x 1.7 cm, unchanged from prior scan in October 2019.  No evidence of metastatic disease.  Stable solid and groundglass pulmonary nodules, stable over multiple prior exams. -CEA was 4.6 on 10/28/2018. -CTAP on 09/21/2020 showed numerous small pulmonary nodules throughout the lung bases, 5 mm.  Enlarged retrocrural periaortic lymph node in the lower chest.  Soft tissue nodule within the central mesentery substantially increased in size, encasing proximal superior mesenteric artery and measuring 5 x 4.3 cm.  Numerous retroperitoneal lymph nodes, largest aortocaval lymph node measuring 2.8 x 2.2 cm. -CT chest on 09/27/2020 shows a left thoracic inlet lymph node approximately 2.3 cm.  Mediastinal lymph nodes and multiple subcentimeter pulmonary nodules suggestive of metastatic disease. -Mesenteric lymph node biopsy on 09/28/2020 shows mucin and fibrosis. -Left supraclavicular lymph node biopsy on 10/14/2020-soft tissue with abundant mucin, rare fragments of atypical columnar epithelium. - 3 cycles of FOLFIRI and bevacizumab dose reduced from 11/10/2020 through 12/08/2020. - [facility] test-BRAF V600 E+, MMR deficient, MSI high, TMB high, HER2 negative, BRCA1/2 pathogenic variant on exon 14 and exon 9 respectively.  The report also suggested decreased benefit to FOLFOX and bevacizumab in the first-line metastatic setting. - Cycle 1 of Keytruda on 01/13/2021.   2.  Health maintenance: -Mammogram dated 05/16/2018 was BI-RADS Category 1.   PLAN:  1.  Metastatic colon cancer to the liver, lungs and lymph nodes: - She does not report any immunotherapy related side effects. - Chronic diarrhea since surgery has been stable. - CEA has been stable between 5-6.  Last CEA was 5.3 on 02/06/2022. - CT CAP (02/06/2022): Numerous unchanged lung nodules.  Unchanged bulky matted appearing mediastinal, retroperitoneal and mesenteric adenopathy.  Unchanged lymph node conglomerate of soft tissue mass encasing the proximal  superior mesenteric artery. - Routine labs showed normal LFTs and CBC. - Proceed with Keytruda every 3 weeks.  RTC 9 weeks for follow-up with repeat CEA.   2.  Diarrhea: - Baseline diarrhea is stable.  Continue Imodium as needed.   Orders placed this encounter:  No orders of the defined types were placed in this encounter.    Derek Jack, MD [place] [phone]   I, Thana Ates, am acting as a scribe for Dr. Derek Jack.  I, Derek Jack MD, have reviewed the above documentation for accuracy and completeness, and I agree with the above.

## 2022-02-08 NOTE — Patient Instructions (Signed)
[place] at [facility] Discharge Instructions   You were seen and examined today by Dr. Delton Coombes.  He reviewed the results of your lab work which are normal/stable.   He reviewed the results of your CT scan which are stable.   We will proceed with your treatment today.   Return as scheduled.    Thank you for choosing [place] at [facility] to provide your oncology and hematology care.  To afford each patient quality time with our provider, please arrive at least 15 minutes before your scheduled appointment time.   If you have a lab appointment with the [facility] please come in thru the Main Entrance and check in at the main information desk.  You need to re-schedule your appointment should you arrive 10 or more minutes late.  We strive to give you quality time with our providers, and arriving late affects you and other patients whose appointments are after yours.  Also, if you no show three or more times for appointments you may be dismissed from the clinic at the providers discretion.     Again, thank you for choosing [facility].  Our hope is that these requests will decrease the amount of time that you wait before being seen by our physicians.       _____________________________________________________________  Should you have questions after your visit to [facility], please contact our office at [phone] and follow the prompts.  Our office hours are 8:00 a.m. and 4:30 p.m. Monday - Friday.  Please note that voicemails left after 4:00 p.m. may not be returned until the following business day.  We are closed weekends and major holidays.  You do have access to a nurse 24-7, just call the main number to the clinic [phone] and do not press any options, hold on the line and a nurse will answer the phone.    For prescription refill requests, have your pharmacy contact our office and allow 72 hours.     Due to Covid, you will need to wear a mask upon entering the hospital. If you do not have a mask, a mask will be given to you at the Main Entrance upon arrival. For doctor visits, patients may have 1 support person age 67 or older with them. For treatment visits, patients can not have anyone with them due to social distancing guidelines and our immunocompromised population.

## 2022-02-08 NOTE — Progress Notes (Signed)
Patient tolerated therapy with no complaints voiced.  Side effects with management reviewed with understanding verbalized.  Port site clean and dry with no bruising or swelling noted at site.  Good blood return noted before and after administration of therapy.  Band aid applied.  Patient left in satisfactory condition with VSS and no s/s of distress noted.  

## 2022-02-09 ENCOUNTER — Other Ambulatory Visit: Payer: Self-pay

## 2022-02-10 ENCOUNTER — Other Ambulatory Visit: Payer: Self-pay

## 2022-02-17 ENCOUNTER — Other Ambulatory Visit: Payer: Self-pay

## 2022-02-27 DIAGNOSIS — I700 Atherosclerosis of aorta: Secondary | ICD-10-CM | POA: Diagnosis not present

## 2022-02-27 DIAGNOSIS — Z6827 Body mass index (BMI) 27.0-27.9, adult: Secondary | ICD-10-CM | POA: Diagnosis not present

## 2022-02-27 DIAGNOSIS — Z1331 Encounter for screening for depression: Secondary | ICD-10-CM | POA: Diagnosis not present

## 2022-02-27 DIAGNOSIS — Z0001 Encounter for general adult medical examination with abnormal findings: Secondary | ICD-10-CM | POA: Diagnosis not present

## 2022-02-27 DIAGNOSIS — E663 Overweight: Secondary | ICD-10-CM | POA: Diagnosis not present

## 2022-03-02 ENCOUNTER — Inpatient Hospital Stay: Payer: Medicare Other

## 2022-03-02 VITALS — BP 110/59 | HR 69 | Temp 98.2°F | Resp 18 | Ht 63.0 in | Wt 155.0 lb

## 2022-03-02 DIAGNOSIS — C778 Secondary and unspecified malignant neoplasm of lymph nodes of multiple regions: Secondary | ICD-10-CM | POA: Diagnosis not present

## 2022-03-02 DIAGNOSIS — Z95828 Presence of other vascular implants and grafts: Secondary | ICD-10-CM

## 2022-03-02 DIAGNOSIS — C189 Malignant neoplasm of colon, unspecified: Secondary | ICD-10-CM

## 2022-03-02 DIAGNOSIS — E064 Drug-induced thyroiditis: Secondary | ICD-10-CM

## 2022-03-02 DIAGNOSIS — Z5112 Encounter for antineoplastic immunotherapy: Secondary | ICD-10-CM | POA: Diagnosis not present

## 2022-03-02 DIAGNOSIS — C184 Malignant neoplasm of transverse colon: Secondary | ICD-10-CM | POA: Diagnosis not present

## 2022-03-02 DIAGNOSIS — C7800 Secondary malignant neoplasm of unspecified lung: Secondary | ICD-10-CM | POA: Diagnosis not present

## 2022-03-02 DIAGNOSIS — C787 Secondary malignant neoplasm of liver and intrahepatic bile duct: Secondary | ICD-10-CM | POA: Diagnosis not present

## 2022-03-02 DIAGNOSIS — Z79899 Other long term (current) drug therapy: Secondary | ICD-10-CM | POA: Diagnosis not present

## 2022-03-02 LAB — COMPREHENSIVE METABOLIC PANEL
ALT: 18 U/L (ref 0–44)
AST: 22 U/L (ref 15–41)
Albumin: 4.4 g/dL (ref 3.5–5.0)
Alkaline Phosphatase: 60 U/L (ref 38–126)
Anion gap: 6 (ref 5–15)
BUN: 9 mg/dL (ref 8–23)
CO2: 27 mmol/L (ref 22–32)
Calcium: 9.1 mg/dL (ref 8.9–10.3)
Chloride: 103 mmol/L (ref 98–111)
Creatinine, Ser: 0.72 mg/dL (ref 0.44–1.00)
GFR, Estimated: >60 mL/min (ref >60)
Glucose, Bld: 95 mg/dL (ref 70–99)
Potassium: 4 mmol/L (ref 3.5–5.1)
Sodium: 136 mmol/L (ref 135–145)
Total Bilirubin: 0.6 mg/dL (ref 0.3–1.2)
Total Protein: 8.2 g/dL — ABNORMAL HIGH (ref 6.5–8.1)

## 2022-03-02 LAB — CBC WITH DIFFERENTIAL/PLATELET
Abs Immature Granulocytes: 0.03 10*3/uL (ref 0.00–0.07)
Basophils Absolute: 0 10*3/uL (ref 0.0–0.1)
Basophils Relative: 1 %
Eosinophils Absolute: 0.1 10*3/uL (ref 0.0–0.5)
Eosinophils Relative: 2 %
HCT: 40.4 % (ref 36.0–46.0)
Hemoglobin: 12.9 g/dL (ref 12.0–15.0)
Immature Granulocytes: 0 %
Lymphocytes Relative: 17 %
Lymphs Abs: 1.3 10*3/uL (ref 0.7–4.0)
MCH: 29.9 pg (ref 26.0–34.0)
MCHC: 31.9 g/dL (ref 30.0–36.0)
MCV: 93.7 fL (ref 80.0–100.0)
Monocytes Absolute: 0.8 10*3/uL (ref 0.1–1.0)
Monocytes Relative: 11 %
Neutro Abs: 5.3 10*3/uL (ref 1.7–7.7)
Neutrophils Relative %: 69 %
Platelets: 186 10*3/uL (ref 150–400)
RBC: 4.31 MIL/uL (ref 3.87–5.11)
RDW: 13.6 % (ref 11.5–15.5)
WBC: 7.6 10*3/uL (ref 4.0–10.5)
nRBC: 0 % (ref 0.0–0.2)

## 2022-03-02 LAB — MAGNESIUM: Magnesium: 2.3 mg/dL (ref 1.7–2.4)

## 2022-03-02 LAB — TSH: TSH: 1.204 u[IU]/mL (ref 0.350–4.500)

## 2022-03-02 MED ORDER — SODIUM CHLORIDE 0.9 % IV SOLN
200.0000 mg | Freq: Once | INTRAVENOUS | Status: AC
  Administered 2022-03-02: 200 mg via INTRAVENOUS
  Filled 2022-03-02: qty 8

## 2022-03-02 MED ORDER — HEPARIN SOD (PORK) LOCK FLUSH 100 UNIT/ML IV SOLN
500.0000 [IU] | Freq: Once | INTRAVENOUS | Status: AC | PRN
  Administered 2022-03-02: 500 [IU]

## 2022-03-02 MED ORDER — SODIUM CHLORIDE 0.9% FLUSH
10.0000 mL | INTRAVENOUS | Status: DC | PRN
  Administered 2022-03-02: 10 mL

## 2022-03-02 MED ORDER — SODIUM CHLORIDE 0.9 % IV SOLN: Freq: Once | INTRAVENOUS | Status: AC

## 2022-03-02 NOTE — Patient Instructions (Signed)
[facility]  Discharge Instructions: Thank you for choosing [facility] to provide your oncology and hematology care.  If you have a lab appointment with the Cancer Center, please come in thru the Main Entrance and check in at the main information desk.  Wear comfortable clothing and clothing appropriate for easy access to any Portacath or PICC line.   We strive to give you quality time with your provider. You may need to reschedule your appointment if you arrive late (15 or more minutes).  Arriving late affects you and other patients whose appointments are after yours.  Also, if you miss three or more appointments without notifying the office, you may be dismissed from the clinic at the provider's discretion.      For prescription refill requests, have your pharmacy contact our office and allow 72 hours for refills to be completed.    Today you received the following chemotherapy and/or immunotherapy agents Keytruda, return as scheduled.   To help prevent nausea and vomiting after your treatment, we encourage you to take your nausea medication as directed.  BELOW ARE SYMPTOMS THAT SHOULD BE REPORTED IMMEDIATELY: *FEVER GREATER THAN 100.4 F (38 C) OR HIGHER *CHILLS OR SWEATING *NAUSEA AND VOMITING THAT IS NOT CONTROLLED WITH YOUR NAUSEA MEDICATION *UNUSUAL SHORTNESS OF BREATH *UNUSUAL BRUISING OR BLEEDING *URINARY PROBLEMS (pain or burning when urinating, or frequent urination) *BOWEL PROBLEMS (unusual diarrhea, constipation, pain near the anus) TENDERNESS IN MOUTH AND THROAT WITH OR WITHOUT PRESENCE OF ULCERS (sore throat, sores in mouth, or a toothache) UNUSUAL RASH, SWELLING OR PAIN  UNUSUAL VAGINAL DISCHARGE OR ITCHING   Items with * indicate a potential emergency and should be followed up as soon as possible or go to the Emergency Department if any problems should occur.  Please show the CHEMOTHERAPY ALERT CARD or IMMUNOTHERAPY ALERT CARD at  check-in to the Emergency Department and triage nurse.  Should you have questions after your visit or need to cancel or reschedule your appointment, please contact [facility] [phone]  and follow the prompts.  Office hours are 8:00 a.m. to 4:30 p.m. Monday - Friday. Please note that voicemails left after 4:00 p.m. may not be returned until the following business day.  We are closed weekends and major holidays. You have access to a nurse at all times for urgent questions. Please call the main number to the clinic [phone] and follow the prompts.  For any non-urgent questions, you may also contact your provider using MyChart. We now offer e-Visits for anyone 18 and older to request care online for non-urgent symptoms. For details visit [email or web address].   Also download the MyChart app! Go to the app store, search "MyChart", open the app, select [facility], and log in with your MyChart username and password.  Masks are optional in the cancer centers. If you would like for your care team to wear a mask while they are taking care of you, please let them know. You may have one support person who is at least 76 years old accompany you for your appointments.  

## 2022-03-02 NOTE — Progress Notes (Signed)
Patient tolerated therapy with no complaints voiced.  Side effects with management reviewed with understanding verbalized.  Port site clean and dry with no bruising or swelling noted at site.  Good blood return noted before and after administration of therapy.  Band aid applied.  Patient left in satisfactory condition with VSS and no s/s of distress noted.  

## 2022-03-11 ENCOUNTER — Other Ambulatory Visit: Payer: Self-pay | Admitting: Hematology

## 2022-03-11 DIAGNOSIS — C189 Malignant neoplasm of colon, unspecified: Secondary | ICD-10-CM

## 2022-03-11 DIAGNOSIS — Z95828 Presence of other vascular implants and grafts: Secondary | ICD-10-CM

## 2022-03-15 ENCOUNTER — Telehealth: Payer: Self-pay | Admitting: *Deleted

## 2022-03-15 NOTE — Patient Outreach (Signed)
  Care Coordination   03/15/2022 Name: Amanda Norris MRN: [id number] DOB: 23-Dec-1945   Care Coordination Outreach Attempts:  An unsuccessful telephone outreach was attempted today to offer the patient information about available care coordination services as a benefit of their health plan.   Follow Up Plan:  Additional outreach attempts will be made to offer the patient care coordination information and services.   Encounter Outcome:  No Answer  Care Coordination Interventions Activated:  No   Care Coordination Interventions:  No, not indicated    Chong Sicilian, BSN, RN-BC [place] [facility] Pharmacist, community Dial: [phone]

## 2022-03-21 ENCOUNTER — Telehealth: Payer: Self-pay | Admitting: *Deleted

## 2022-03-21 NOTE — Patient Outreach (Signed)
  Care Coordination   03/21/2022 Name: Amanda Norris MRN: [id number] DOB: Nov 08, 1945   Care Coordination Outreach Attempts:  A second unsuccessful outreach was attempted today to offer the patient with information about available care coordination services as a benefit of their health plan.     Follow Up Plan:  Additional outreach attempts will be made to offer the patient care coordination information and services.   Encounter Outcome:  No Answer  Care Coordination Interventions Activated:  No   Care Coordination Interventions:  No, not indicated    Chong Sicilian, BSN, RN-BC RN Care Coordinator [place]: [phone] Main #: [phone]

## 2022-03-22 DIAGNOSIS — H04123 Dry eye syndrome of bilateral lacrimal glands: Secondary | ICD-10-CM | POA: Diagnosis not present

## 2022-03-22 DIAGNOSIS — H26493 Other secondary cataract, bilateral: Secondary | ICD-10-CM | POA: Diagnosis not present

## 2022-03-22 DIAGNOSIS — H35361 Drusen (degenerative) of macula, right eye: Secondary | ICD-10-CM | POA: Diagnosis not present

## 2022-03-23 ENCOUNTER — Inpatient Hospital Stay: Payer: Medicare Other

## 2022-03-23 ENCOUNTER — Inpatient Hospital Stay: Payer: Medicare Other | Attending: Hematology

## 2022-03-23 ENCOUNTER — Other Ambulatory Visit: Payer: Self-pay

## 2022-03-23 VITALS — BP 114/61 | HR 61 | Temp 98.0°F | Resp 18 | Wt 156.8 lb

## 2022-03-23 DIAGNOSIS — Z95828 Presence of other vascular implants and grafts: Secondary | ICD-10-CM

## 2022-03-23 DIAGNOSIS — C189 Malignant neoplasm of colon, unspecified: Secondary | ICD-10-CM

## 2022-03-23 DIAGNOSIS — C787 Secondary malignant neoplasm of liver and intrahepatic bile duct: Secondary | ICD-10-CM | POA: Diagnosis not present

## 2022-03-23 DIAGNOSIS — C184 Malignant neoplasm of transverse colon: Secondary | ICD-10-CM | POA: Insufficient documentation

## 2022-03-23 DIAGNOSIS — C778 Secondary and unspecified malignant neoplasm of lymph nodes of multiple regions: Secondary | ICD-10-CM | POA: Insufficient documentation

## 2022-03-23 DIAGNOSIS — Z79899 Other long term (current) drug therapy: Secondary | ICD-10-CM | POA: Diagnosis not present

## 2022-03-23 DIAGNOSIS — Z5112 Encounter for antineoplastic immunotherapy: Secondary | ICD-10-CM | POA: Diagnosis not present

## 2022-03-23 DIAGNOSIS — C7800 Secondary malignant neoplasm of unspecified lung: Secondary | ICD-10-CM | POA: Insufficient documentation

## 2022-03-23 LAB — CBC WITH DIFFERENTIAL/PLATELET
Abs Immature Granulocytes: 0.02 10*3/uL (ref 0.00–0.07)
Basophils Absolute: 0 10*3/uL (ref 0.0–0.1)
Basophils Relative: 1 %
Eosinophils Absolute: 0.2 10*3/uL (ref 0.0–0.5)
Eosinophils Relative: 3 %
HCT: 40.8 % (ref 36.0–46.0)
Hemoglobin: 13.4 g/dL (ref 12.0–15.0)
Immature Granulocytes: 0 %
Lymphocytes Relative: 23 %
Lymphs Abs: 1.4 10*3/uL (ref 0.7–4.0)
MCH: 30.8 pg (ref 26.0–34.0)
MCHC: 32.8 g/dL (ref 30.0–36.0)
MCV: 93.8 fL (ref 80.0–100.0)
Monocytes Absolute: 0.6 10*3/uL (ref 0.1–1.0)
Monocytes Relative: 10 %
Neutro Abs: 3.8 10*3/uL (ref 1.7–7.7)
Neutrophils Relative %: 63 %
Platelets: 188 10*3/uL (ref 150–400)
RBC: 4.35 MIL/uL (ref 3.87–5.11)
RDW: 13.3 % (ref 11.5–15.5)
WBC: 6 10*3/uL (ref 4.0–10.5)
nRBC: 0 % (ref 0.0–0.2)

## 2022-03-23 LAB — COMPREHENSIVE METABOLIC PANEL
ALT: 21 U/L (ref 0–44)
AST: 25 U/L (ref 15–41)
Albumin: 4.3 g/dL (ref 3.5–5.0)
Alkaline Phosphatase: 65 U/L (ref 38–126)
Anion gap: 7 (ref 5–15)
BUN: 13 mg/dL (ref 8–23)
CO2: 26 mmol/L (ref 22–32)
Calcium: 9.1 mg/dL (ref 8.9–10.3)
Chloride: 103 mmol/L (ref 98–111)
Creatinine, Ser: 0.69 mg/dL (ref 0.44–1.00)
GFR, Estimated: >60 mL/min (ref >60)
Glucose, Bld: 98 mg/dL (ref 70–99)
Potassium: 3.9 mmol/L (ref 3.5–5.1)
Sodium: 136 mmol/L (ref 135–145)
Total Bilirubin: 0.7 mg/dL (ref 0.3–1.2)
Total Protein: 8.1 g/dL (ref 6.5–8.1)

## 2022-03-23 LAB — TSH: TSH: 1.581 u[IU]/mL (ref 0.350–4.500)

## 2022-03-23 LAB — MAGNESIUM: Magnesium: 2.1 mg/dL (ref 1.7–2.4)

## 2022-03-23 MED ORDER — SODIUM CHLORIDE 0.9 % IV SOLN
200.0000 mg | Freq: Once | INTRAVENOUS | Status: AC
  Administered 2022-03-23: 200 mg via INTRAVENOUS
  Filled 2022-03-23: qty 8

## 2022-03-23 MED ORDER — HEPARIN SOD (PORK) LOCK FLUSH 100 UNIT/ML IV SOLN
500.0000 [IU] | Freq: Once | INTRAVENOUS | Status: AC | PRN
  Administered 2022-03-23: 500 [IU]

## 2022-03-23 MED ORDER — SODIUM CHLORIDE 0.9% FLUSH
10.0000 mL | INTRAVENOUS | Status: DC | PRN
  Administered 2022-03-23: 10 mL

## 2022-03-23 MED ORDER — SODIUM CHLORIDE 0.9 % IV SOLN: Freq: Once | INTRAVENOUS | Status: AC

## 2022-03-23 NOTE — Progress Notes (Signed)
Pt presents today for Keytruda per privder's order. Vital signs and labs WNL for treatment today. Okay to proceed with treatment today.  Keytruda given today per MD orders. Tolerated infusion without adverse affects. Vital signs stable. No complaints at this time. Discharged from clinic ambulatory in stable condition. Alert and oriented x 3. F/U with [facility] as scheduled.

## 2022-03-23 NOTE — Patient Instructions (Signed)
[facility]  Discharge Instructions: Thank you for choosing [facility] to provide your oncology and hematology care.  If you have a lab appointment with the Cancer Center, please come in thru the Main Entrance and check in at the main information desk.  Wear comfortable clothing and clothing appropriate for easy access to any Portacath or PICC line.   We strive to give you quality time with your provider. You may need to reschedule your appointment if you arrive late (15 or more minutes).  Arriving late affects you and other patients whose appointments are after yours.  Also, if you miss three or more appointments without notifying the office, you may be dismissed from the clinic at the provider's discretion.      For prescription refill requests, have your pharmacy contact our office and allow 72 hours for refills to be completed.    Today you received the following chemotherapy and/or immunotherapy agents Keytruda.      To help prevent nausea and vomiting after your treatment, we encourage you to take your nausea medication as directed.  BELOW ARE SYMPTOMS THAT SHOULD BE REPORTED IMMEDIATELY: *FEVER GREATER THAN 100.4 F (38 C) OR HIGHER *CHILLS OR SWEATING *NAUSEA AND VOMITING THAT IS NOT CONTROLLED WITH YOUR NAUSEA MEDICATION *UNUSUAL SHORTNESS OF BREATH *UNUSUAL BRUISING OR BLEEDING *URINARY PROBLEMS (pain or burning when urinating, or frequent urination) *BOWEL PROBLEMS (unusual diarrhea, constipation, pain near the anus) TENDERNESS IN MOUTH AND THROAT WITH OR WITHOUT PRESENCE OF ULCERS (sore throat, sores in mouth, or a toothache) UNUSUAL RASH, SWELLING OR PAIN  UNUSUAL VAGINAL DISCHARGE OR ITCHING   Items with * indicate a potential emergency and should be followed up as soon as possible or go to the Emergency Department if any problems should occur.  Please show the CHEMOTHERAPY ALERT CARD or IMMUNOTHERAPY ALERT CARD at check-in to the  Emergency Department and triage nurse.  Should you have questions after your visit or need to cancel or reschedule your appointment, please contact [facility] [phone]  and follow the prompts.  Office hours are 8:00 a.m. to 4:30 p.m. Monday - Friday. Please note that voicemails left after 4:00 p.m. may not be returned until the following business day.  We are closed weekends and major holidays. You have access to a nurse at all times for urgent questions. Please call the main number to the clinic [phone] and follow the prompts.  For any non-urgent questions, you may also contact your provider using MyChart. We now offer e-Visits for anyone 18 and older to request care online for non-urgent symptoms. For details visit [email or web address].   Also download the MyChart app! Go to the app store, search "MyChart", open the app, select [facility], and log in with your MyChart username and password.  Masks are optional in the cancer centers. If you would like for your care team to wear a mask while they are taking care of you, please let them know. You may have one support person who is at least 76 years old accompany you for your appointments.  Pembrolizumab Injection What is this medication? PEMBROLIZUMAB (PEM broe LIZ ue mab) treats some types of cancer. It works by helping your immune system slow or stop the spread of cancer cells. It is a monoclonal antibody. This medicine may be used for other purposes; ask your health care provider or pharmacist if you have questions. COMMON BRAND NAME(S): Keytruda What should I tell my care team before   I take this medication? They need to know if you have any of these conditions: Allogeneic stem cell transplant (uses someone else's stem cells) Autoimmune diseases, such as Crohn disease, ulcerative colitis, lupus History of chest radiation Nervous system problems, such as Guillain-Barre syndrome, myasthenia gravis Organ  transplant An unusual or allergic reaction to pembrolizumab, other medications, foods, dyes, or preservatives Pregnant or trying to get pregnant Breast-feeding How should I use this medication? This medication is injected into a vein. It is given by your care team in a hospital or clinic setting. A special MedGuide will be given to you before each treatment. Be sure to read this information carefully each time. Talk to your care team about the use of this medication in children. While it may be prescribed for children as young as 6 months for selected conditions, precautions do apply. Overdosage: If you think you have taken too much of this medicine contact a poison control center or emergency room at once. NOTE: This medicine is only for you. Do not share this medicine with others. What if I miss a dose? Keep appointments for follow-up doses. It is important not to miss your dose. Call your care team if you are unable to keep an appointment. What may interact with this medication? Interactions have not been studied. This list may not describe all possible interactions. Give your health care provider a list of all the medicines, herbs, non-prescription drugs, or dietary supplements you use. Also tell them if you smoke, drink alcohol, or use illegal drugs. Some items may interact with your medicine. What should I watch for while using this medication? Your condition will be monitored carefully while you are receiving this medication. You may need blood work while taking this medication. This medication may cause serious skin reactions. They can happen weeks to months after starting the medication. Contact your care team right away if you notice fevers or flu-like symptoms with a rash. The rash may be red or purple and then turn into blisters or peeling of the skin. You may also notice a red rash with swelling of the face, lips, or lymph nodes in your neck or under your arms. Tell your care team  right away if you have any change in your eyesight. Talk to your care team if you may be pregnant. Serious birth defects can occur if you take this medication during pregnancy and for 4 months after the last dose. You will need a negative pregnancy test before starting this medication. Contraception is recommended while taking this medication and for 4 months after the last dose. Your care team can help you find the option that works for you. Do not breastfeed while taking this medication and for 4 months after the last dose. What side effects may I notice from receiving this medication? Side effects that you should report to your care team as soon as possible: Allergic reactions--skin rash, itching, hives, swelling of the face, lips, tongue, or throat Dry cough, shortness of breath or trouble breathing Eye pain, redness, irritation, or discharge with blurry or decreased vision Heart muscle inflammation--unusual weakness or fatigue, shortness of breath, chest pain, fast or irregular heartbeat, dizziness, swelling of the ankles, feet, or hands Hormone gland problems--headache, sensitivity to light, unusual weakness or fatigue, dizziness, fast or irregular heartbeat, increased sensitivity to cold or heat, excessive sweating, constipation, hair loss, increased thirst or amount of urine, tremors or shaking, irritability Infusion reactions--chest pain, shortness of breath or trouble breathing, feeling faint or lightheaded   Kidney injury (glomerulonephritis)--decrease in the amount of urine, red or dark brown urine, foamy or bubbly urine, swelling of the ankles, hands, or feet Liver injury--right upper belly pain, loss of appetite, nausea, light-colored stool, dark yellow or brown urine, yellowing skin or eyes, unusual weakness or fatigue Pain, tingling, or numbness in the hands or feet, muscle weakness, change in vision, confusion or trouble speaking, loss of balance or coordination, trouble walking,  seizures Rash, fever, and swollen lymph nodes Redness, blistering, peeling, or loosening of the skin, including inside the mouth Sudden or severe stomach pain, bloody diarrhea, fever, nausea, vomiting Side effects that usually do not require medical attention (report to your care team if they continue or are bothersome): Bone, joint, or muscle pain Diarrhea Fatigue Loss of appetite Nausea Skin rash This list may not describe all possible side effects. Call your doctor for medical advice about side effects. You may report side effects to FDA at 1-800-FDA-1088. Where should I keep my medication? This medication is given in a hospital or clinic. It will not be stored at home. NOTE: This sheet is a summary. It may not cover all possible information. If you have questions about this medicine, talk to your doctor, pharmacist, or health care provider.  2023 Elsevier/Gold Standard (2021-10-17 00:00:00)  

## 2022-03-28 ENCOUNTER — Telehealth: Payer: Self-pay | Admitting: *Deleted

## 2022-03-28 NOTE — Patient Outreach (Signed)
  Care Coordination   03/28/2022 Name: Amanda Norris MRN: [id number] DOB: 1946/02/27   Care Coordination Outreach Attempts:  A third unsuccessful outreach was attempted today to offer the patient with information about available care coordination services as a benefit of their health plan.   Follow Up Plan:  No further outreach attempts will be made at this time. We have been unable to contact the patient to offer or enroll patient in care coordination services  Encounter Outcome:  No Answer  Care Coordination Interventions Activated:  No   Care Coordination Interventions:  No, not indicated    Chong Sicilian, BSN, RN-BC RN Care Coordinator [place]: [phone] Main #: [phone]

## 2022-04-07 ENCOUNTER — Other Ambulatory Visit: Payer: Self-pay

## 2022-04-11 ENCOUNTER — Other Ambulatory Visit: Payer: Self-pay

## 2022-04-12 ENCOUNTER — Other Ambulatory Visit: Payer: Self-pay

## 2022-04-12 DIAGNOSIS — C189 Malignant neoplasm of colon, unspecified: Secondary | ICD-10-CM

## 2022-04-13 ENCOUNTER — Inpatient Hospital Stay: Payer: Medicare Other

## 2022-04-13 ENCOUNTER — Inpatient Hospital Stay: Payer: Medicare Other | Attending: Hematology | Admitting: Hematology

## 2022-04-13 ENCOUNTER — Other Ambulatory Visit: Payer: Self-pay | Admitting: *Deleted

## 2022-04-13 ENCOUNTER — Encounter: Payer: Self-pay | Admitting: Hematology

## 2022-04-13 VITALS — BP 120/77 | HR 64 | Temp 97.0°F | Resp 18

## 2022-04-13 VITALS — BP 156/97 | HR 74 | Temp 98.4°F | Wt 155.1 lb

## 2022-04-13 DIAGNOSIS — C189 Malignant neoplasm of colon, unspecified: Secondary | ICD-10-CM

## 2022-04-13 DIAGNOSIS — Z95828 Presence of other vascular implants and grafts: Secondary | ICD-10-CM | POA: Diagnosis not present

## 2022-04-13 DIAGNOSIS — Z5112 Encounter for antineoplastic immunotherapy: Secondary | ICD-10-CM | POA: Insufficient documentation

## 2022-04-13 DIAGNOSIS — Z79899 Other long term (current) drug therapy: Secondary | ICD-10-CM | POA: Insufficient documentation

## 2022-04-13 DIAGNOSIS — C184 Malignant neoplasm of transverse colon: Secondary | ICD-10-CM | POA: Insufficient documentation

## 2022-04-13 DIAGNOSIS — C778 Secondary and unspecified malignant neoplasm of lymph nodes of multiple regions: Secondary | ICD-10-CM | POA: Insufficient documentation

## 2022-04-13 DIAGNOSIS — C787 Secondary malignant neoplasm of liver and intrahepatic bile duct: Secondary | ICD-10-CM | POA: Insufficient documentation

## 2022-04-13 LAB — COMPREHENSIVE METABOLIC PANEL
ALT: 22 U/L (ref 0–44)
AST: 26 U/L (ref 15–41)
Albumin: 4.1 g/dL (ref 3.5–5.0)
Alkaline Phosphatase: 67 U/L (ref 38–126)
Anion gap: 9 (ref 5–15)
BUN: 12 mg/dL (ref 8–23)
CO2: 26 mmol/L (ref 22–32)
Calcium: 9.2 mg/dL (ref 8.9–10.3)
Chloride: 102 mmol/L (ref 98–111)
Creatinine, Ser: 0.66 mg/dL (ref 0.44–1.00)
GFR, Estimated: >60 mL/min (ref >60)
Glucose, Bld: 96 mg/dL (ref 70–99)
Potassium: 3.8 mmol/L (ref 3.5–5.1)
Sodium: 137 mmol/L (ref 135–145)
Total Bilirubin: 0.7 mg/dL (ref 0.3–1.2)
Total Protein: 8 g/dL (ref 6.5–8.1)

## 2022-04-13 LAB — CBC WITH DIFFERENTIAL/PLATELET
Abs Immature Granulocytes: 0.02 10*3/uL (ref 0.00–0.07)
Basophils Absolute: 0 10*3/uL (ref 0.0–0.1)
Basophils Relative: 1 %
Eosinophils Absolute: 0.1 10*3/uL (ref 0.0–0.5)
Eosinophils Relative: 2 %
HCT: 40.6 % (ref 36.0–46.0)
Hemoglobin: 13.3 g/dL (ref 12.0–15.0)
Immature Granulocytes: 0 %
Lymphocytes Relative: 23 %
Lymphs Abs: 1.3 10*3/uL (ref 0.7–4.0)
MCH: 30.4 pg (ref 26.0–34.0)
MCHC: 32.8 g/dL (ref 30.0–36.0)
MCV: 92.9 fL (ref 80.0–100.0)
Monocytes Absolute: 0.5 10*3/uL (ref 0.1–1.0)
Monocytes Relative: 9 %
Neutro Abs: 3.7 10*3/uL (ref 1.7–7.7)
Neutrophils Relative %: 65 %
Platelets: 194 10*3/uL (ref 150–400)
RBC: 4.37 MIL/uL (ref 3.87–5.11)
RDW: 13.2 % (ref 11.5–15.5)
WBC: 5.6 10*3/uL (ref 4.0–10.5)
nRBC: 0 % (ref 0.0–0.2)

## 2022-04-13 LAB — MAGNESIUM: Magnesium: 2.1 mg/dL (ref 1.7–2.4)

## 2022-04-13 MED ORDER — SODIUM CHLORIDE 0.9% FLUSH
10.0000 mL | INTRAVENOUS | Status: DC | PRN
  Administered 2022-04-13: 10 mL

## 2022-04-13 MED ORDER — SODIUM CHLORIDE 0.9 % IV SOLN
200.0000 mg | Freq: Once | INTRAVENOUS | Status: AC
  Administered 2022-04-13: 200 mg via INTRAVENOUS
  Filled 2022-04-13: qty 8

## 2022-04-13 MED ORDER — SODIUM CHLORIDE 0.9 % IV SOLN: Freq: Once | INTRAVENOUS | Status: AC

## 2022-04-13 MED ORDER — HEPARIN SOD (PORK) LOCK FLUSH 100 UNIT/ML IV SOLN
500.0000 [IU] | Freq: Once | INTRAVENOUS | Status: AC | PRN
  Administered 2022-04-13: 500 [IU]

## 2022-04-13 NOTE — Progress Notes (Signed)
Patient presents today for Keytruda infusion per providers order.  Vital signs and labs within parameters for treatment.  Patient has no new complaints at this time.  Treatment given today per MD orders.  Stable during infusion without adverse affects.  Vital signs stable.  No complaints at this time.  Discharge from clinic ambulatory in stable condition.  Alert and oriented X 3.  Follow up with [facility] as scheduled.  

## 2022-04-13 NOTE — Progress Notes (Signed)
Patient has been examined by Dr. Katragadda, and vital signs and labs have been reviewed. ANC, Creatinine, LFTs, hemoglobin, and platelets are within treatment parameters per M.D. - pt may proceed with treatment.  Primary RN and pharmacy notified.  

## 2022-04-13 NOTE — Patient Instructions (Signed)
[facility]  Discharge Instructions: Thank you for choosing [facility] to provide your oncology and hematology care.  If you have a lab appointment with the Cancer Center, please come in thru the Main Entrance and check in at the main information desk.  Wear comfortable clothing and clothing appropriate for easy access to any Portacath or PICC line.   We strive to give you quality time with your provider. You may need to reschedule your appointment if you arrive late (15 or more minutes).  Arriving late affects you and other patients whose appointments are after yours.  Also, if you miss three or more appointments without notifying the office, you may be dismissed from the clinic at the provider's discretion.      For prescription refill requests, have your pharmacy contact our office and allow 72 hours for refills to be completed.    Today you received the following chemotherapy and/or immunotherapy agents keytruda       To help prevent nausea and vomiting after your treatment, we encourage you to take your nausea medication as directed.  BELOW ARE SYMPTOMS THAT SHOULD BE REPORTED IMMEDIATELY: *FEVER GREATER THAN 100.4 F (38 C) OR HIGHER *CHILLS OR SWEATING *NAUSEA AND VOMITING THAT IS NOT CONTROLLED WITH YOUR NAUSEA MEDICATION *UNUSUAL SHORTNESS OF BREATH *UNUSUAL BRUISING OR BLEEDING *URINARY PROBLEMS (pain or burning when urinating, or frequent urination) *BOWEL PROBLEMS (unusual diarrhea, constipation, pain near the anus) TENDERNESS IN MOUTH AND THROAT WITH OR WITHOUT PRESENCE OF ULCERS (sore throat, sores in mouth, or a toothache) UNUSUAL RASH, SWELLING OR PAIN  UNUSUAL VAGINAL DISCHARGE OR ITCHING   Items with * indicate a potential emergency and should be followed up as soon as possible or go to the Emergency Department if any problems should occur.  Please show the CHEMOTHERAPY ALERT CARD or IMMUNOTHERAPY ALERT CARD at check-in to the  Emergency Department and triage nurse.  Should you have questions after your visit or need to cancel or reschedule your appointment, please contact [facility] [phone]  and follow the prompts.  Office hours are 8:00 a.m. to 4:30 p.m. Monday - Friday. Please note that voicemails left after 4:00 p.m. may not be returned until the following business day.  We are closed weekends and major holidays. You have access to a nurse at all times for urgent questions. Please call the main number to the clinic [phone] and follow the prompts.  For any non-urgent questions, you may also contact your provider using MyChart. We now offer e-Visits for anyone 18 and older to request care online for non-urgent symptoms. For details visit [email or web address].   Also download the MyChart app! Go to the app store, search "MyChart", open the app, select [facility], and log in with your MyChart username and password.  Masks are optional in the cancer centers. If you would like for your care team to wear a mask while they are taking care of you, please let them know. You may have one support person who is at least 76 years old accompany you for your appointments.  

## 2022-04-13 NOTE — Patient Instructions (Signed)
[facility] Discharge Instructions   You were seen and examined today by Dr. Katragadda.     Thank you for choosing [facility] at [facility] to provide your oncology and hematology care.  To afford each patient quality time with our provider, please arrive at least 15 minutes before your scheduled appointment time.   If you have a lab appointment with the Cancer Center please come in thru the Main Entrance and check in at the main information desk.  You need to re-schedule your appointment should you arrive 10 or more minutes late.  We strive to give you quality time with our providers, and arriving late affects you and other patients whose appointments are after yours.  Also, if you no show three or more times for appointments you may be dismissed from the clinic at the providers discretion.     Again, thank you for choosing [facility].  Our hope is that these requests will decrease the amount of time that you wait before being seen by our physicians.       _____________________________________________________________  Should you have questions after your visit to [facility], please contact our office at [phone] and follow the prompts.  Our office hours are 8:00 a.m. and 4:30 p.m. Monday - Friday.  Please note that voicemails left after 4:00 p.m. may not be returned until the following business day.  We are closed weekends and major holidays.  You do have access to a nurse 24-7, just call the main number to the clinic [phone] and do not press any options, hold on the line and a nurse will answer the phone.    For prescription refill requests, have your pharmacy contact our office and allow 72 hours.    Due to Covid, you will need to wear a mask upon entering the hospital. If you do not have a mask, a mask will be given to you at the Main Entrance upon arrival. For doctor visits, patients may  have 1 support person age 18 or older with them. For treatment visits, patients can not have anyone with them due to social distancing guidelines and our immunocompromised population.      

## 2022-04-13 NOTE — Progress Notes (Signed)
[place], [place] [address]   CLINIC:  Medical Oncology/Hematology  PCP:  [facility], [place] / [place] [address] [phone]   REASON FOR VISIT:  Follow-up for transverse colon adenocarcinoma  PRIOR THERAPY:  1. Right hemicolectomy on 12/29/2013. 2. FOLFOX x 3 cycles from 02/10/2014 to 03/10/2014, stopped secondary to poor tolerance  NGS Results: MSI high, MMR deficient, BRAF V600E, TMB high  CURRENT THERAPY: Keytruda every 3 weeks  BRIEF ONCOLOGIC HISTORY:  Oncology History  Adenocarcinoma of transverse colon ([place]) (Resolved)  12/09/2013 Initial Diagnosis   1. Colon, biopsy, proximal transverse mass INVASIVE ADENOCARCINOMA.   12/29/2013 Definitive Surgery   Colon, segmental resection for tumor, right - INVASIVE COLORECTAL ADENOCARCINOMA, 7 CM, EXTENDING INTO PERICOLONIC CONNECTIVE TISSUE. - METASTATIC CARCINOMA IN 6 OF 33 LYMPH NODES (6/33).   02/10/2014 - 03/10/2014 Adjuvant Chemotherapy   FOLFOX x 3 cycles   03/11/2014 Adverse Reaction   Patient called reporting diarrhea despite dose reduction and she wants to stop therapy.  Patient seen on 03/24/14 to discuss other treatment options and she stands by her decision to stop all therapy.   04/01/2014 Surgery   Port-a-cath removal by Dr. Arnoldo Morale.   01/12/2015 PET scan   Mild abdominal mesenteric LAD show hypermetabolic activity, 7 mm indeterminate pulm nodule in sup segment of RLL shows no assoc metabolic activity   9/52/8413 PET scan   Mild progression of nodal mets in anterior mid abdominal mesentery, new nodal mets at L thoracic inlet. stable 6 mm nodule in posterior RLL, unchanged since 2015   06/22/2015 PET scan   Resolution of metabolic activity and decreased size of mild LAD at L thoracic inlet, stable mild mid abd hypermetabolic mesenteric LAD, stable 6 mm posterior LLL pulm nodule without metabolic activity   2/44/0102 Imaging   MRI lumbar spine L4-L5 disc  degenerated, broad based disc hernation, B facet arthropathy with hypertophy and edema, stenosis of lateral recesses that could cause neural compression   10/18/2016 Imaging   CT CAP- 1. Several small ground-glass pulmonary nodules are present in the lungs and are stable over the past 9 months, but several are mildly larger than they were in 2015. Low-grade adenocarcinoma can sometimes have this appearance and surveillance is likely warranted. 2. There is a cluster of nodal tissue in the central mesentery, maximum short axis diameter 1.3 cm. This nodal cluster is relatively low in density and not appreciably changed from the prior exam. Given the lack of change is may represent quiescent residua of malignancy, but again, surveillance is likely warranted. 3. The left-sided rib fractures are more numerous than was revealed on the prior radiographs ; there are nondisplaced healing fractures of the left fifth, sixth, seventh, eighth, ninth, and tenth ribs. 4. Mild cardiomegaly although part of this appearance may be due to pectus excavatum. 5. Several additional small pulmonary nodules are stable from 2015 and considered benign. 6. Right hemicolectomy and sigmoid colon diverticulosis. 7. Small right paraumbilical hernia containing adipose tissue. 8. Lower lumbar spondylosis and degenerative disc disease potentially with mild impingement at L4-5.   04/25/2017 Imaging   CT C/A/P: Scattered solid pulmonary nodules measure up to 6 mm in the right lower lobe, unchanged. There are a few scattered ground-glass nodules measuring up to 8 mm in the right upper lobe, also stable. Distal perigastric lymph nodes are stable. No additional evidence of metastatic disease.   Metastatic colon cancer to liver ([place])  11/04/2020 Initial Diagnosis  Metastatic colon cancer to liver ([place])   11/10/2020 - 12/10/2020 Chemotherapy         01/13/2021 - 03/02/2022 Chemotherapy   Patient is on Treatment Plan : COLORECTAL  Pembrolizumab q21d     01/13/2021 -  Chemotherapy   Patient is on Treatment Plan : COLORECTAL Pembrolizumab (200) q21d       CANCER STAGING:  Cancer Staging  No matching staging information was found for the patient.  INTERVAL HISTORY:  Ms. YMANI PORCHER, a 76 y.o. female, returns for follow-up of metastatic colon cancer.  She is receiving Keytruda and is seen prior to her next treatment for toxicity assessment.  Last treatment was on 03/23/2022.  She denies any worsening of baseline diarrhea.  Denies any cough or hemoptysis.  Reports energy levels are 50%.  Appetite is stable.  REVIEW OF SYSTEMS:  Review of Systems  Gastrointestinal:  Positive for diarrhea (stable).  All other systems reviewed and are negative.   PAST MEDICAL/SURGICAL HISTORY:  Past Medical History:  Diagnosis Date   Arthritis    wrist and thumbs   Colon cancer (HCC)    PONV (postoperative nausea and vomiting)    Port-A-Cath in place 11/09/2020   Ruptured lumbar disc    L1-L5 per patient report   Sciatica of left side    Past Surgical History:  Procedure Laterality Date   COLONOSCOPY N/A 12/12/2013   Procedure: COLONOSCOPY;  Surgeon: Rogene Houston, MD;  [facility];  Service: Endoscopy;  Laterality: N/A;  730   COLONOSCOPY N/A 01/20/2015   Procedure: COLONOSCOPY;  Surgeon: Rogene Houston, MD;  [facility];  Service: Endoscopy;  Laterality: N/A;  1030   COLONOSCOPY N/A 03/28/2018   Procedure: COLONOSCOPY;  Surgeon: Rogene Houston, MD;  [facility];  Service: Endoscopy;  Laterality: N/A;  1015   EUS N/A 12/18/2013   Procedure: UPPER ENDOSCOPIC ULTRASOUND (EUS) LINEAR;  Surgeon: Milus Banister, MD;  Location: [facility];  Service: Endoscopy;  Laterality: N/A;   herniated disc     [id number]   IR IMAGING GUIDED PORT INSERTION  10/14/2020   IR US GUIDANCE  10/14/2020   PARTIAL COLECTOMY N/A 12/29/2013   Procedure: RIGHT HEMICOLECTOMY;  Surgeon: Jamesetta So, MD;  Location: [facility];  Service: General;  Laterality: N/A;   POLYPECTOMY  03/28/2018   Procedure: POLYPECTOMY;  Surgeon: Rogene Houston, MD;  [facility];  Service: Endoscopy;;  transverse colon (hot snare);   PORT-A-CATH REMOVAL Right 04/01/2014   Procedure: MINOR REMOVAL PORT-A-CATH;  Surgeon: Jamesetta So, MD;  Location: [facility];  Service: General;  Laterality: Right;   PORTACATH PLACEMENT Right 02/04/2014   Procedure: INSERTION PORT-A-CATH;  Surgeon: Jamesetta So, MD;  Location: [facility];  Service: General;  Laterality: Right;    SOCIAL HISTORY:  Social History   Socioeconomic History   Marital status: Married    Spouse name: Not on file   Number of children: Not on file   Years of education: Not on file   Highest education level: Not on file  Occupational History   Not on file  Tobacco Use   Smoking status: Former    Packs/day: 0.25    Years: 5.00    Total pack years: 1.25    Types: Cigarettes    Quit date: 04/19/1959    Years since quitting: 63.0   Smokeless tobacco: Never   Tobacco comments:    smoked for 5 years as  teenager  Vaping Use   Vaping Use: Never used  Substance and Sexual Activity   Alcohol use: No   Drug use: No   Sexual activity: Yes    Birth control/protection: Post-menopausal  Other Topics Concern   Not on file  Social History Narrative   Not on file   Social Determinants of Health   Financial Resource Strain: Low Risk  (10/04/2020)   Overall Financial Resource Strain (CARDIA)    Difficulty of Paying Living Expenses: Not hard at all  Food Insecurity: No Food Insecurity (10/04/2020)   Hunger Vital Sign    Worried About Running Out of Food in the Last Year: Never true    Ran Out of Food in the Last Year: Never true  Transportation Needs: No Transportation Needs (10/04/2020)   PRAPARE - Hydrologist (Medical): No    Lack of Transportation (Non-Medical): No  Physical Activity: Inactive (10/04/2020)   Exercise Vital Sign     Days of Exercise per Week: 0 days    Minutes of Exercise per Session: 0 min  Stress: Stress Concern Present (10/04/2020)   [place]    Feeling of Stress : To some extent  Social Connections: Socially Integrated (10/04/2020)   Social Connection and Isolation Panel [NHANES]    Frequency of Communication with Friends and Family: More than three times a week    Frequency of Social Gatherings with Friends and Family: More than three times a week    Attends Religious Services: More than 4 times per year    Active Member of [employer] or Organizations: Yes    Attends Music therapist: More than 4 times per year    Marital Status: Married  Human resources officer Violence: Not At Risk (10/04/2020)   Humiliation, Afraid, Rape, and Kick questionnaire    Fear of Current or Ex-Partner: No    Emotionally Abused: No    Physically Abused: No    Sexually Abused: No    FAMILY HISTORY:  Family History  Problem Relation Age of Onset   Diabetes type II Mother    Dementia Father     CURRENT MEDICATIONS:  Current Outpatient Medications  Medication Sig Dispense Refill   acetaminophen (TYLENOL) 500 MG tablet Take 500 mg by mouth every 6 (six) hours as needed for moderate pain or headache.     aspirin 81 MG EC tablet Take 1 tablet (81 mg total) by mouth daily. Swallow whole. 30 tablet 12   Bioflavonoid Products (ESTER C PO) Take 1,000 mg by mouth daily.     Cholecalciferol (D3-1000) 25 MCG (1000 UT) capsule Take 125 Units by mouth daily.     Cranberry-Vitamin C-Vitamin E 4200-20-3 MG-MG-UNIT CAPS Take 500 mg by mouth daily.     Garlic 7121 MG CAPS Take 1,000 mg by mouth daily.      lidocaine-prilocaine (EMLA) cream      Multiple Vitamin (MULTIVITAMIN) tablet Take 1 tablet by mouth daily.     Omega-3 Fatty Acids (FISH OIL) 1200 MG CAPS Take 1,000 mg by mouth daily.     OVER THE COUNTER MEDICATION Take 1,000 mg by mouth daily. [place]  1000 mg daily     OVER THE COUNTER MEDICATION Take 1,000 mg by mouth daily. L-Lysine $RemoveBeforeD'1000mg'qncJeLYkFBRFYK$  po daily     pramoxine-hydrocortisone (PROCTOCREAM-HC) 1-1 % rectal cream Place 1 application rectally 2 (two) times daily. 30 g 0   zinc gluconate 50 MG tablet Take 50  mg by mouth daily.     No current facility-administered medications for this visit.    ALLERGIES:  Allergies  Allergen Reactions   Sulfa Antibiotics Other (See Comments)    "Sores in my mouth"    PHYSICAL EXAM:  Performance status (ECOG): 1 - Symptomatic but completely ambulatory  Vitals:   04/13/22 1300  BP: (!) 156/97  Pulse: 74  Temp: 98.4 F (36.9 C)  SpO2: 99%   Wt Readings from Last 3 Encounters:  04/13/22 155 lb 1.6 oz (70.4 kg)  03/23/22 156 lb 12.8 oz (71.1 kg)  03/02/22 154 lb 15.7 oz (70.3 kg)   Physical Exam Vitals reviewed.  Constitutional:      Appearance: Normal appearance.  Cardiovascular:     Rate and Rhythm: Normal rate and regular rhythm.     Pulses: Normal pulses.     Heart sounds: Normal heart sounds.  Pulmonary:     Effort: Pulmonary effort is normal.     Breath sounds: Normal breath sounds.  Neurological:     General: No focal deficit present.     Mental Status: She is alert and oriented to person, place, and time.  Psychiatric:        Mood and Affect: Mood normal.        Behavior: Behavior normal.    LABORATORY DATA:  I have reviewed the labs as listed.     Latest Ref Rng & Units 04/13/2022   12:01 PM 03/23/2022   12:15 PM 03/02/2022   12:26 PM  CBC  WBC 4.0 - 10.5 K/uL 5.6  6.0  7.6   Hemoglobin 12.0 - 15.0 g/dL 13.3  13.4  12.9   Hematocrit 36.0 - 46.0 % 40.6  40.8  40.4   Platelets 150 - 400 K/uL 194  188  186       Latest Ref Rng & Units 04/13/2022   12:01 PM 03/23/2022   12:15 PM 03/02/2022   12:26 PM  CMP  Glucose 70 - 99 mg/dL 96  98  95   BUN 8 - 23 mg/dL $Remove'12  13  9   'teiJeMJ$ Creatinine 0.44 - 1.00 mg/dL 0.66  0.69  0.72   Sodium 135 - 145 mmol/L 137  136  136   Potassium  3.5 - 5.1 mmol/L 3.8  3.9  4.0   Chloride 98 - 111 mmol/L 102  103  103   CO2 22 - 32 mmol/L $RemoveB'26  26  27   'BQqVaGIh$ Calcium 8.9 - 10.3 mg/dL 9.2  9.1  9.1   Total Protein 6.5 - 8.1 g/dL 8.0  8.1  8.2   Total Bilirubin 0.3 - 1.2 mg/dL 0.7  0.7  0.6   Alkaline Phos 38 - 126 U/L 67  65  60   AST 15 - 41 U/L $Remo'26  25  22   'QkLrE$ ALT 0 - 44 U/L $Remo'22  21  18     'LuiMy$ DIAGNOSTIC IMAGING:  I have independently reviewed the scans and discussed with the patient. No results found.   ASSESSMENT:  1.  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 - Status post 3 cycles of FOLFOX from 02/10/2014 through 03/10/2014, discontinued secondary to poor tolerance.  DPD was negative. -Last colonoscopy on 03/28/2018 patent ileo-colonic anastomosis.  8 mm polyp in the transverse colon.  Diverticulosis in the sigmoid colon, descending colon. - CT scan on 11/04/2018 shows postoperative  findings of right colectomy with redemonstrated mesenteric nodule/lymph node measuring 1.9 x 1.7 cm, unchanged from prior scan in October 2019.  No evidence of metastatic disease.  Stable solid and groundglass pulmonary nodules, stable over multiple prior exams. -CEA was 4.6 on 10/28/2018. -CTAP on 09/21/2020 showed numerous small pulmonary nodules throughout the lung bases, 5 mm.  Enlarged retrocrural periaortic lymph node in the lower chest.  Soft tissue nodule within the central mesentery substantially increased in size, encasing proximal superior mesenteric artery and measuring 5 x 4.3 cm.  Numerous retroperitoneal lymph nodes, largest aortocaval lymph node measuring 2.8 x 2.2 cm. -CT chest on 09/27/2020 shows a left thoracic inlet lymph node approximately 2.3 cm.  Mediastinal lymph nodes and multiple subcentimeter pulmonary nodules suggestive of metastatic disease. -Mesenteric lymph node biopsy on 09/28/2020 shows mucin and  fibrosis. -Left supraclavicular lymph node biopsy on 10/14/2020-soft tissue with abundant mucin, rare fragments of atypical columnar epithelium. - 3 cycles of FOLFIRI and bevacizumab dose reduced from 11/10/2020 through 12/08/2020. - [facility]is test-BRAF V600 E+, MMR deficient, MSI high, TMB high, HER2 negative, BRCA1/2 pathogenic variant on exon 14 and exon 9 respectively.  The report also suggested decreased benefit to FOLFOX and bevacizumab in the first-line metastatic setting. - Cycle 1 of Keytruda on 01/13/2021.   2.  Health maintenance: -Mammogram dated 05/16/2018 was BI-RADS Category 1.   PLAN:  1.  Metastatic colon cancer to the liver, lungs and lymph nodes, BRAF V600E+: - CT CAP (02/06/2022): Numerous unchanged lung nodules.  Unchanged bulky matted appearing mediastinal, retroperitoneal and mesenteric adenopathy.  Unchanged lymph node conglomerate of the soft tissue mass encasing the proximal superior mesenteric artery. - Reviewed labs today which showed normal LFTs and CBC.  Last CEA was 5.3 on 02/06/2022.  TSH is 1.5. - Recommend proceeding with Keytruda today and in 3 weeks.  We will check a CEA level in 3 weeks. - RTC 6 weeks for follow-up.  We will plan to repeat CT scan prior to next visit.   2.  Diarrhea: - Baseline diarrhea is stable.  Continue Imodium as needed.   Orders placed this encounter:  Orders Placed This Encounter  Procedures   CT CHEST [place], [place] [phone]

## 2022-05-03 ENCOUNTER — Other Ambulatory Visit: Payer: Self-pay

## 2022-05-04 ENCOUNTER — Ambulatory Visit: Payer: Medicare Other | Admitting: Hematology

## 2022-05-04 ENCOUNTER — Other Ambulatory Visit: Payer: Medicare Other

## 2022-05-04 ENCOUNTER — Inpatient Hospital Stay: Payer: Medicare Other

## 2022-05-04 VITALS — BP 138/59 | HR 57 | Temp 98.3°F | Resp 16 | Wt 158.0 lb

## 2022-05-04 DIAGNOSIS — Z95828 Presence of other vascular implants and grafts: Secondary | ICD-10-CM

## 2022-05-04 DIAGNOSIS — C189 Malignant neoplasm of colon, unspecified: Secondary | ICD-10-CM

## 2022-05-04 DIAGNOSIS — C778 Secondary and unspecified malignant neoplasm of lymph nodes of multiple regions: Secondary | ICD-10-CM | POA: Diagnosis not present

## 2022-05-04 DIAGNOSIS — C787 Secondary malignant neoplasm of liver and intrahepatic bile duct: Secondary | ICD-10-CM | POA: Diagnosis not present

## 2022-05-04 DIAGNOSIS — C184 Malignant neoplasm of transverse colon: Secondary | ICD-10-CM | POA: Diagnosis not present

## 2022-05-04 DIAGNOSIS — Z5112 Encounter for antineoplastic immunotherapy: Secondary | ICD-10-CM | POA: Diagnosis not present

## 2022-05-04 DIAGNOSIS — Z79899 Other long term (current) drug therapy: Secondary | ICD-10-CM | POA: Diagnosis not present

## 2022-05-04 LAB — COMPREHENSIVE METABOLIC PANEL
ALT: 22 U/L (ref 0–44)
AST: 24 U/L (ref 15–41)
Albumin: 4.1 g/dL (ref 3.5–5.0)
Alkaline Phosphatase: 66 U/L (ref 38–126)
Anion gap: 7 (ref 5–15)
BUN: 11 mg/dL (ref 8–23)
CO2: 27 mmol/L (ref 22–32)
Calcium: 9.1 mg/dL (ref 8.9–10.3)
Chloride: 104 mmol/L (ref 98–111)
Creatinine, Ser: 0.56 mg/dL (ref 0.44–1.00)
GFR, Estimated: >60 mL/min (ref >60)
Glucose, Bld: 96 mg/dL (ref 70–99)
Potassium: 3.7 mmol/L (ref 3.5–5.1)
Sodium: 138 mmol/L (ref 135–145)
Total Bilirubin: 0.9 mg/dL (ref 0.3–1.2)
Total Protein: 7.8 g/dL (ref 6.5–8.1)

## 2022-05-04 LAB — CBC WITH DIFFERENTIAL/PLATELET
Abs Immature Granulocytes: 0.01 10*3/uL (ref 0.00–0.07)
Basophils Absolute: 0 10*3/uL (ref 0.0–0.1)
Basophils Relative: 1 %
Eosinophils Absolute: 0.1 10*3/uL (ref 0.0–0.5)
Eosinophils Relative: 2 %
HCT: 39.4 % (ref 36.0–46.0)
Hemoglobin: 13.1 g/dL (ref 12.0–15.0)
Immature Granulocytes: 0 %
Lymphocytes Relative: 21 %
Lymphs Abs: 1.3 10*3/uL (ref 0.7–4.0)
MCH: 30.9 pg (ref 26.0–34.0)
MCHC: 33.2 g/dL (ref 30.0–36.0)
MCV: 92.9 fL (ref 80.0–100.0)
Monocytes Absolute: 0.6 10*3/uL (ref 0.1–1.0)
Monocytes Relative: 10 %
Neutro Abs: 3.9 10*3/uL (ref 1.7–7.7)
Neutrophils Relative %: 66 %
Platelets: 189 10*3/uL (ref 150–400)
RBC: 4.24 MIL/uL (ref 3.87–5.11)
RDW: 13 % (ref 11.5–15.5)
WBC: 5.9 10*3/uL (ref 4.0–10.5)
nRBC: 0 % (ref 0.0–0.2)

## 2022-05-04 MED ORDER — HEPARIN SOD (PORK) LOCK FLUSH 100 UNIT/ML IV SOLN
500.0000 [IU] | Freq: Once | INTRAVENOUS | Status: AC | PRN
  Administered 2022-05-04: 500 [IU]

## 2022-05-04 MED ORDER — SODIUM CHLORIDE 0.9% FLUSH
10.0000 mL | INTRAVENOUS | Status: DC | PRN
  Administered 2022-05-04: 10 mL

## 2022-05-04 MED ORDER — SODIUM CHLORIDE 0.9 % IV SOLN
200.0000 mg | Freq: Once | INTRAVENOUS | Status: AC
  Administered 2022-05-04: 200 mg via INTRAVENOUS
  Filled 2022-05-04: qty 8

## 2022-05-04 MED ORDER — SODIUM CHLORIDE 0.9 % IV SOLN: Freq: Once | INTRAVENOUS | Status: AC

## 2022-05-04 NOTE — Patient Instructions (Signed)
[facility]  Discharge Instructions: Thank you for choosing [facility] to provide your oncology and hematology care.  If you have a lab appointment with the Cancer Center, please come in thru the Main Entrance and check in at the main information desk.  Wear comfortable clothing and clothing appropriate for easy access to any Portacath or PICC line.   We strive to give you quality time with your provider. You may need to reschedule your appointment if you arrive late (15 or more minutes).  Arriving late affects you and other patients whose appointments are after yours.  Also, if you miss three or more appointments without notifying the office, you may be dismissed from the clinic at the provider's discretion.      For prescription refill requests, have your pharmacy contact our office and allow 72 hours for refills to be completed.    Today you received the following chemotherapy and/or immunotherapy agents Keytruda   To help prevent nausea and vomiting after your treatment, we encourage you to take your nausea medication as directed.  Pembrolizumab Injection What is this medication? PEMBROLIZUMAB (PEM broe LIZ ue mab) treats some types of cancer. It works by helping your immune system slow or stop the spread of cancer cells. It is a monoclonal antibody. This medicine may be used for other purposes; ask your health care provider or pharmacist if you have questions. COMMON BRAND NAME(S): Keytruda What should I tell my care team before I take this medication? They need to know if you have any of these conditions: Allogeneic stem cell transplant (uses someone else's stem cells) Autoimmune diseases, such as Crohn disease, ulcerative colitis, lupus History of chest radiation Nervous system problems, such as Guillain-Barre syndrome, myasthenia gravis Organ transplant An unusual or allergic reaction to pembrolizumab, other medications, foods, dyes, or  preservatives Pregnant or trying to get pregnant Breast-feeding How should I use this medication? This medication is injected into a vein. It is given by your care team in a hospital or clinic setting. A special MedGuide will be given to you before each treatment. Be sure to read this information carefully each time. Talk to your care team about the use of this medication in children. While it may be prescribed for children as young as 6 months for selected conditions, precautions do apply. Overdosage: If you think you have taken too much of this medicine contact a poison control center or emergency room at once. NOTE: This medicine is only for you. Do not share this medicine with others. What if I miss a dose? Keep appointments for follow-up doses. It is important not to miss your dose. Call your care team if you are unable to keep an appointment. What may interact with this medication? Interactions have not been studied. This list may not describe all possible interactions. Give your health care provider a list of all the medicines, herbs, non-prescription drugs, or dietary supplements you use. Also tell them if you smoke, drink alcohol, or use illegal drugs. Some items may interact with your medicine. What should I watch for while using this medication? Your condition will be monitored carefully while you are receiving this medication. You may need blood work while taking this medication. This medication may cause serious skin reactions. They can happen weeks to months after starting the medication. Contact your care team right away if you notice fevers or flu-like symptoms with a rash. The rash may be red or purple and then turn into   blisters or peeling of the skin. You may also notice a red rash with swelling of the face, lips, or lymph nodes in your neck or under your arms. Tell your care team right away if you have any change in your eyesight. Talk to your care team if you may be pregnant.  Serious birth defects can occur if you take this medication during pregnancy and for 4 months after the last dose. You will need a negative pregnancy test before starting this medication. Contraception is recommended while taking this medication and for 4 months after the last dose. Your care team can help you find the option that works for you. Do not breastfeed while taking this medication and for 4 months after the last dose. What side effects may I notice from receiving this medication? Side effects that you should report to your care team as soon as possible: Allergic reactions--skin rash, itching, hives, swelling of the face, lips, tongue, or throat Dry cough, shortness of breath or trouble breathing Eye pain, redness, irritation, or discharge with blurry or decreased vision Heart muscle inflammation--unusual weakness or fatigue, shortness of breath, chest pain, fast or irregular heartbeat, dizziness, swelling of the ankles, feet, or hands Hormone gland problems--headache, sensitivity to light, unusual weakness or fatigue, dizziness, fast or irregular heartbeat, increased sensitivity to cold or heat, excessive sweating, constipation, hair loss, increased thirst or amount of urine, tremors or shaking, irritability Infusion reactions--chest pain, shortness of breath or trouble breathing, feeling faint or lightheaded Kidney injury (glomerulonephritis)--decrease in the amount of urine, red or dark brown urine, foamy or bubbly urine, swelling of the ankles, hands, or feet Liver injury--right upper belly pain, loss of appetite, nausea, light-colored stool, dark yellow or brown urine, yellowing skin or eyes, unusual weakness or fatigue Pain, tingling, or numbness in the hands or feet, muscle weakness, change in vision, confusion or trouble speaking, loss of balance or coordination, trouble walking, seizures Rash, fever, and swollen lymph nodes Redness, blistering, peeling, or loosening of the skin,  including inside the mouth Sudden or severe stomach pain, bloody diarrhea, fever, nausea, vomiting Side effects that usually do not require medical attention (report to your care team if they continue or are bothersome): Bone, joint, or muscle pain Diarrhea Fatigue Loss of appetite Nausea Skin rash This list may not describe all possible side effects. Call your doctor for medical advice about side effects. You may report side effects to FDA at 1-800-FDA-1088. Where should I keep my medication? This medication is given in a hospital or clinic. It will not be stored at home. NOTE: This sheet is a summary. It may not cover all possible information. If you have questions about this medicine, talk to your doctor, pharmacist, or health care provider.  2023 Elsevier/Gold Standard (2013-03-17 00:00:00)   BELOW ARE SYMPTOMS THAT SHOULD BE REPORTED IMMEDIATELY: *FEVER GREATER THAN 100.4 F (38 C) OR HIGHER *CHILLS OR SWEATING *NAUSEA AND VOMITING THAT IS NOT CONTROLLED WITH YOUR NAUSEA MEDICATION *UNUSUAL SHORTNESS OF BREATH *UNUSUAL BRUISING OR BLEEDING *URINARY PROBLEMS (pain or burning when urinating, or frequent urination) *BOWEL PROBLEMS (unusual diarrhea, constipation, pain near the anus) TENDERNESS IN MOUTH AND THROAT WITH OR WITHOUT PRESENCE OF ULCERS (sore throat, sores in mouth, or a toothache) UNUSUAL RASH, SWELLING OR PAIN  UNUSUAL VAGINAL DISCHARGE OR ITCHING   Items with * indicate a potential emergency and should be followed up as soon as possible or go to the Emergency Department if any problems should occur.  Please show the CHEMOTHERAPY   ALERT CARD or IMMUNOTHERAPY ALERT CARD at check-in to the Emergency Department and triage nurse.  Should you have questions after your visit or need to cancel or reschedule your appointment, please contact [facility] [phone]  and follow the prompts.  Office hours are 8:00 a.m. to 4:30 p.m. Monday - Friday. Please  note that voicemails left after 4:00 p.m. may not be returned until the following business day.  We are closed weekends and major holidays. You have access to a nurse at all times for urgent questions. Please call the main number to the clinic [phone] and follow the prompts.  For any non-urgent questions, you may also contact your provider using MyChart. We now offer e-Visits for anyone 18 and older to request care online for non-urgent symptoms. For details visit [email or web address].   Also download the MyChart app! Go to the app store, search "MyChart", open the app, select [facility], and log in with your MyChart username and password.  Masks are optional in the cancer centers. If you would like for your care team to wear a mask while they are taking care of you, please let them know. You may have one support person who is at least 76 years old accompany you for your appointments.  

## 2022-05-04 NOTE — Progress Notes (Signed)
Pt presents today for Keytruda per provider's order. Vital signs and labs WNL for treatment. Okay to proceed with treatment today.  Keytruda given today per MD orders. Tolerated infusion without adverse affects. Vital signs stable. No complaints at this time. Discharged from clinic ambulatory in stable condition. Alert and oriented x 3. F/U with [facility] as scheduled.   

## 2022-05-06 LAB — CEA: CEA: 5.4 ng/mL — ABNORMAL HIGH (ref 0.0–4.7)

## 2022-05-09 ENCOUNTER — Other Ambulatory Visit: Payer: Self-pay

## 2022-05-16 ENCOUNTER — Other Ambulatory Visit: Payer: Self-pay

## 2022-05-18 ENCOUNTER — Ambulatory Visit (HOSPITAL_COMMUNITY)
Admission: RE | Admit: 2022-05-18 | Discharge: 2022-05-18 | Disposition: A | Discharge status: Home / Self Care | Payer: Medicare Other | Source: Ambulatory Visit | Attending: Hematology | Admitting: Hematology

## 2022-05-18 DIAGNOSIS — R918 Other nonspecific abnormal finding of lung field: Secondary | ICD-10-CM | POA: Diagnosis not present

## 2022-05-18 DIAGNOSIS — I862 Pelvic varices: Secondary | ICD-10-CM | POA: Diagnosis not present

## 2022-05-18 DIAGNOSIS — C189 Malignant neoplasm of colon, unspecified: Secondary | ICD-10-CM | POA: Insufficient documentation

## 2022-05-18 DIAGNOSIS — C787 Secondary malignant neoplasm of liver and intrahepatic bile duct: Secondary | ICD-10-CM | POA: Diagnosis not present

## 2022-05-18 DIAGNOSIS — R590 Localized enlarged lymph nodes: Secondary | ICD-10-CM | POA: Diagnosis not present

## 2022-05-18 DIAGNOSIS — Z9049 Acquired absence of other specified parts of digestive tract: Secondary | ICD-10-CM | POA: Diagnosis not present

## 2022-05-18 MED ORDER — IOHEXOL 300 MG/ML  SOLN
100.0000 mL | Freq: Once | INTRAMUSCULAR | Status: AC | PRN
  Administered 2022-05-18: 100 mL via INTRAVENOUS

## 2022-05-24 ENCOUNTER — Other Ambulatory Visit: Payer: Self-pay

## 2022-05-25 ENCOUNTER — Inpatient Hospital Stay (HOSPITAL_BASED_OUTPATIENT_CLINIC_OR_DEPARTMENT_OTHER): Payer: Medicare Other | Admitting: Hematology

## 2022-05-25 ENCOUNTER — Inpatient Hospital Stay: Payer: Medicare Other

## 2022-05-25 ENCOUNTER — Inpatient Hospital Stay: Payer: Medicare Other | Attending: Hematology

## 2022-05-25 VITALS — BP 118/76 | HR 61 | Temp 97.9°F | Resp 18

## 2022-05-25 DIAGNOSIS — C189 Malignant neoplasm of colon, unspecified: Secondary | ICD-10-CM | POA: Diagnosis not present

## 2022-05-25 DIAGNOSIS — C184 Malignant neoplasm of transverse colon: Secondary | ICD-10-CM | POA: Insufficient documentation

## 2022-05-25 DIAGNOSIS — C7800 Secondary malignant neoplasm of unspecified lung: Secondary | ICD-10-CM | POA: Diagnosis not present

## 2022-05-25 DIAGNOSIS — C787 Secondary malignant neoplasm of liver and intrahepatic bile duct: Secondary | ICD-10-CM | POA: Insufficient documentation

## 2022-05-25 DIAGNOSIS — Z79899 Other long term (current) drug therapy: Secondary | ICD-10-CM | POA: Diagnosis not present

## 2022-05-25 DIAGNOSIS — C778 Secondary and unspecified malignant neoplasm of lymph nodes of multiple regions: Secondary | ICD-10-CM | POA: Insufficient documentation

## 2022-05-25 DIAGNOSIS — Z95828 Presence of other vascular implants and grafts: Secondary | ICD-10-CM | POA: Diagnosis not present

## 2022-05-25 DIAGNOSIS — Z5112 Encounter for antineoplastic immunotherapy: Secondary | ICD-10-CM | POA: Insufficient documentation

## 2022-05-25 LAB — CBC WITH DIFFERENTIAL/PLATELET
Abs Immature Granulocytes: 0.01 10*3/uL (ref 0.00–0.07)
Basophils Absolute: 0 10*3/uL (ref 0.0–0.1)
Basophils Relative: 1 %
Eosinophils Absolute: 0.1 10*3/uL (ref 0.0–0.5)
Eosinophils Relative: 2 %
HCT: 41.9 % (ref 36.0–46.0)
Hemoglobin: 13.7 g/dL (ref 12.0–15.0)
Immature Granulocytes: 0 %
Lymphocytes Relative: 20 %
Lymphs Abs: 1.2 10*3/uL (ref 0.7–4.0)
MCH: 30.3 pg (ref 26.0–34.0)
MCHC: 32.7 g/dL (ref 30.0–36.0)
MCV: 92.7 fL (ref 80.0–100.0)
Monocytes Absolute: 0.6 10*3/uL (ref 0.1–1.0)
Monocytes Relative: 10 %
Neutro Abs: 4.1 10*3/uL (ref 1.7–7.7)
Neutrophils Relative %: 67 %
Platelets: 195 10*3/uL (ref 150–400)
RBC: 4.52 MIL/uL (ref 3.87–5.11)
RDW: 12.9 % (ref 11.5–15.5)
WBC: 6 10*3/uL (ref 4.0–10.5)
nRBC: 0 % (ref 0.0–0.2)

## 2022-05-25 LAB — COMPREHENSIVE METABOLIC PANEL
ALT: 21 U/L (ref 0–44)
AST: 27 U/L (ref 15–41)
Albumin: 4.3 g/dL (ref 3.5–5.0)
Alkaline Phosphatase: 70 U/L (ref 38–126)
Anion gap: 8 (ref 5–15)
BUN: 10 mg/dL (ref 8–23)
CO2: 26 mmol/L (ref 22–32)
Calcium: 9.4 mg/dL (ref 8.9–10.3)
Chloride: 104 mmol/L (ref 98–111)
Creatinine, Ser: 0.66 mg/dL (ref 0.44–1.00)
GFR, Estimated: >60 mL/min (ref >60)
Glucose, Bld: 99 mg/dL (ref 70–99)
Potassium: 3.8 mmol/L (ref 3.5–5.1)
Sodium: 138 mmol/L (ref 135–145)
Total Bilirubin: 0.4 mg/dL (ref 0.3–1.2)
Total Protein: 8.2 g/dL — ABNORMAL HIGH (ref 6.5–8.1)

## 2022-05-25 LAB — TSH: TSH: 2.57 u[IU]/mL (ref 0.350–4.500)

## 2022-05-25 MED ORDER — SODIUM CHLORIDE 0.9 % IV SOLN: Freq: Once | INTRAVENOUS | Status: AC

## 2022-05-25 MED ORDER — HEPARIN SOD (PORK) LOCK FLUSH 100 UNIT/ML IV SOLN
500.0000 [IU] | Freq: Once | INTRAVENOUS | Status: AC | PRN
  Administered 2022-05-25: 500 [IU]

## 2022-05-25 MED ORDER — SODIUM CHLORIDE 0.9% FLUSH
10.0000 mL | INTRAVENOUS | Status: DC | PRN
  Administered 2022-05-25: 10 mL

## 2022-05-25 MED ORDER — SODIUM CHLORIDE 0.9 % IV SOLN
200.0000 mg | Freq: Once | INTRAVENOUS | Status: AC
  Administered 2022-05-25: 200 mg via INTRAVENOUS
  Filled 2022-05-25: qty 8

## 2022-05-25 NOTE — Progress Notes (Signed)
Patient has been examined by Dr. Katragadda, and vital signs and labs have been reviewed. ANC, Creatinine, LFTs, hemoglobin, and platelets are within treatment parameters per M.D. - pt may proceed with treatment.  Primary RN and pharmacy notified.  

## 2022-05-25 NOTE — Progress Notes (Signed)
Patient presents today for [facility] per providers order.  Labs and Vital signs reviewed by the MD.  Message received from Anastasio Champion RN/Dr. Delton Coombes, patient okay for treatment.  Treatment given today per MD orders.  Stable during infusion without adverse affects.  Vital signs stable.  No complaints at this time.  Discharge from clinic ambulatory in stable condition.  Alert and oriented X 3.  Follow up with [facility] as scheduled.

## 2022-05-25 NOTE — Patient Instructions (Addendum)
[facility] at [facility] Discharge Instructions   You were seen and examined today by Dr. Katragadda.  He reviewed the results of your CT scan which is stable.   He reviewed the results of your lab work which are normal/stable.   We will proceed with your treatment today.   Return as scheduled.    Thank you for choosing [facility] at [facility] to provide your oncology and hematology care.  To afford each patient quality time with our provider, please arrive at least 15 minutes before your scheduled appointment time.   If you have a lab appointment with the Cancer Center please come in thru the Main Entrance and check in at the main information desk.  You need to re-schedule your appointment should you arrive 10 or more minutes late.  We strive to give you quality time with our providers, and arriving late affects you and other patients whose appointments are after yours.  Also, if you no show three or more times for appointments you may be dismissed from the clinic at the providers discretion.     Again, thank you for choosing [facility].  Our hope is that these requests will decrease the amount of time that you wait before being seen by our physicians.       _____________________________________________________________  Should you have questions after your visit to [facility], please contact our office at [phone] and follow the prompts.  Our office hours are 8:00 a.m. and 4:30 p.m. Monday - Friday.  Please note that voicemails left after 4:00 p.m. may not be returned until the following business day.  We are closed weekends and major holidays.  You do have access to a nurse 24-7, just call the main number to the clinic [phone] and do not press any options, hold on the line and a nurse will answer the phone.    For prescription refill requests, have your pharmacy contact our office and allow 72 hours.     Due to Covid, you will need to wear a mask upon entering the hospital. If you do not have a mask, a mask will be given to you at the Main Entrance upon arrival. For doctor visits, patients may have 1 support person age 18 or older with them. For treatment visits, patients can not have anyone with them due to social distancing guidelines and our immunocompromised population.      

## 2022-05-25 NOTE — Progress Notes (Signed)
[place] [address] [place], [place] [address]   CLINIC:  Medical Oncology/Hematology  PCP:  [facility], [place] / [place] [address] [phone]   REASON FOR VISIT:  Follow-up for transverse colon adenocarcinoma  PRIOR THERAPY:  1. Right hemicolectomy on 12/29/2013. 2. FOLFOX x 3 cycles from 02/10/2014 to 03/10/2014, stopped secondary to poor tolerance  NGS Results: MSI high, MMR deficient, BRAF V600E, TMB high  CURRENT THERAPY: Keytruda every 3 weeks  BRIEF ONCOLOGIC HISTORY:  Oncology History  Adenocarcinoma of transverse colon ([place]) (Resolved)  12/09/2013 Initial Diagnosis   1. Colon, biopsy, proximal transverse mass INVASIVE ADENOCARCINOMA.   12/29/2013 Definitive Surgery   Colon, segmental resection for tumor, right - INVASIVE COLORECTAL ADENOCARCINOMA, 7 CM, EXTENDING INTO PERICOLONIC CONNECTIVE TISSUE. - METASTATIC CARCINOMA IN 6 OF 33 LYMPH NODES (6/33).   02/10/2014 - 03/10/2014 Adjuvant Chemotherapy   FOLFOX x 3 cycles   03/11/2014 Adverse Reaction   Patient called reporting diarrhea despite dose reduction and she wants to stop therapy.  Patient seen on 03/24/14 to discuss other treatment options and she stands by her decision to stop all therapy.   04/01/2014 Surgery   Port-a-cath removal by Dr. Arnoldo Morale.   01/12/2015 PET scan   Mild abdominal mesenteric LAD show hypermetabolic activity, 7 mm indeterminate pulm nodule in sup segment of RLL shows no assoc metabolic activity   9/52/8413 PET scan   Mild progression of nodal mets in anterior mid abdominal mesentery, new nodal mets at L thoracic inlet. stable 6 mm nodule in posterior RLL, unchanged since 2015   06/22/2015 PET scan   Resolution of metabolic activity and decreased size of mild LAD at L thoracic inlet, stable mild mid abd hypermetabolic mesenteric LAD, stable 6 mm posterior LLL pulm nodule without metabolic activity   2/44/0102 Imaging   MRI lumbar spine L4-L5 disc  degenerated, broad based disc hernation, B facet arthropathy with hypertophy and edema, stenosis of lateral recesses that could cause neural compression   10/18/2016 Imaging   CT CAP- 1. Several small ground-glass pulmonary nodules are present in the lungs and are stable over the past 9 months, but several are mildly larger than they were in 2015. Low-grade adenocarcinoma can sometimes have this appearance and surveillance is likely warranted. 2. There is a cluster of nodal tissue in the central mesentery, maximum short axis diameter 1.3 cm. This nodal cluster is relatively low in density and not appreciably changed from the prior exam. Given the lack of change is may represent quiescent residua of malignancy, but again, surveillance is likely warranted. 3. The left-sided rib fractures are more numerous than was revealed on the prior radiographs ; there are nondisplaced healing fractures of the left fifth, sixth, seventh, eighth, ninth, and tenth ribs. 4. Mild cardiomegaly although part of this appearance may be due to pectus excavatum. 5. Several additional small pulmonary nodules are stable from 2015 and considered benign. 6. Right hemicolectomy and sigmoid colon diverticulosis. 7. Small right paraumbilical hernia containing adipose tissue. 8. Lower lumbar spondylosis and degenerative disc disease potentially with mild impingement at L4-5.   04/25/2017 Imaging   CT C/A/P: Scattered solid pulmonary nodules measure up to 6 mm in the right lower lobe, unchanged. There are a few scattered ground-glass nodules measuring up to 8 mm in the right upper lobe, also stable. Distal perigastric lymph nodes are stable. No additional evidence of metastatic disease.   Metastatic colon cancer to liver ([place])  11/04/2020 Initial Diagnosis  Metastatic colon cancer to liver ([place])   11/10/2020 - 12/10/2020 Chemotherapy         01/13/2021 - 03/02/2022 Chemotherapy   Patient is on Treatment Plan : COLORECTAL  Pembrolizumab q21d     01/13/2021 -  Chemotherapy   Patient is on Treatment Plan : COLORECTAL Pembrolizumab (200) q21d       CANCER STAGING:  Cancer Staging  No matching staging information was found for the patient.  INTERVAL HISTORY:  Amanda Norris, a 76 y.o. female, seen for follow-up of metastatic colon cancer.  She is tolerating Keytruda reasonably well.  She has chronic diarrhea which is stable and controlled with Imodium as needed.  Energy levels are reported as 80%.  Denies any skin rashes severe fatigue or joint pains.  REVIEW OF SYSTEMS:  Review of Systems  Gastrointestinal:  Positive for diarrhea (stable).  All other systems reviewed and are negative.   PAST MEDICAL/SURGICAL HISTORY:  Past Medical History:  Diagnosis Date   Arthritis    wrist and thumbs   Colon cancer (HCC)    PONV (postoperative nausea and vomiting)    Port-A-Cath in place 11/09/2020   Ruptured lumbar disc    L1-L5 per patient report   Sciatica of left side    Past Surgical History:  Procedure Laterality Date   COLONOSCOPY N/A 12/12/2013   Procedure: COLONOSCOPY;  Surgeon: Rogene Houston, MD;  [facility];  Service: Endoscopy;  Laterality: N/A;  730   COLONOSCOPY N/A 01/20/2015   Procedure: COLONOSCOPY;  Surgeon: Rogene Houston, MD;  Location: [facility];  Service: Endoscopy;  Laterality: N/A;  1030   COLONOSCOPY N/A 03/28/2018   Procedure: COLONOSCOPY;  Surgeon: Rogene Houston, MD;  Location: [facility];  Service: Endoscopy;  Laterality: N/A;  1015   EUS N/A 12/18/2013   Procedure: UPPER ENDOSCOPIC ULTRASOUND (EUS) LINEAR;  Surgeon: Milus Banister, MD;  Location: [facility];  Service: Endoscopy;  Laterality: N/A;   herniated disc     [id number]   IR IMAGING GUIDED PORT INSERTION  10/14/2020   IR US GUIDANCE  10/14/2020   PARTIAL COLECTOMY N/A 12/29/2013   Procedure: RIGHT HEMICOLECTOMY;  Surgeon: Jamesetta So, MD;  Location: [facility];  Service: General;  Laterality: N/A;    POLYPECTOMY  03/28/2018   Procedure: POLYPECTOMY;  Surgeon: Rogene Houston, MD;  [facility]: [facility];  Service: Endoscopy;;  transverse colon (hot snare);   PORT-A-CATH REMOVAL Right 04/01/2014   Procedure: MINOR REMOVAL PORT-A-CATH;  Surgeon: Jamesetta So, MD;  Location: [facility];  Service: General;  Laterality: Right;   PORTACATH PLACEMENT Right 02/04/2014   Procedure: INSERTION PORT-A-CATH;  Surgeon: Jamesetta So, MD;  Location: [facility];  Service: General;  Laterality: Right;    SOCIAL HISTORY:  Social History   Socioeconomic History   Marital status: Married    Spouse name: Not on file   Number of children: Not on file   Years of education: Not on file   Highest education level: Not on file  Occupational History   Not on file  Tobacco Use   Smoking status: Former    Packs/day: 0.25    Years: 5.00    Total pack years: 1.25    Types: Cigarettes    Quit date: 04/19/1959    Years since quitting: 63.1   Smokeless tobacco: Never   Tobacco comments:    smoked for 5 years as teenager  Vaping Use   Vaping Use:  Never used  Substance and Sexual Activity   Alcohol use: No   Drug use: No   Sexual activity: Yes    Birth control/protection: Post-menopausal  Other Topics Concern   Not on file  Social History Narrative   Not on file   Social Determinants of Health   Financial Resource Strain: Low Risk  (10/04/2020)   Overall Financial Resource Strain (CARDIA)    Difficulty of Paying Living Expenses: Not hard at all  Food Insecurity: No Food Insecurity (10/04/2020)   Hunger Vital Sign    Worried About Running Out of Food in the Last Year: Never true    Ran Out of Food in the Last Year: Never true  Transportation Needs: No Transportation Needs (10/04/2020)   PRAPARE - Hydrologist (Medical): No    Lack of Transportation (Non-Medical): No  Physical Activity: Inactive (10/04/2020)   Exercise Vital Sign    Days of Exercise per Week: 0 days     Minutes of Exercise per Session: 0 min  Stress: Stress Concern Present (10/04/2020)   [place]    Feeling of Stress : To some extent  Social Connections: Socially Integrated (10/04/2020)   Social Connection and Isolation Panel [NHANES]    Frequency of Communication with Friends and Family: More than three times a week    Frequency of Social Gatherings with Friends and Family: More than three times a week    Attends Religious Services: More than 4 times per year    Active Member of [employer] or Organizations: Yes    Attends Music therapist: More than 4 times per year    Marital Status: Married  Human resources officer Violence: Not At Risk (10/04/2020)   Humiliation, Afraid, Rape, and Kick questionnaire    Fear of Current or Ex-Partner: No    Emotionally Abused: No    Physically Abused: No    Sexually Abused: No    FAMILY HISTORY:  Family History  Problem Relation Age of Onset   Diabetes type II Mother    Dementia Father     CURRENT MEDICATIONS:  Current Outpatient Medications  Medication Sig Dispense Refill   acetaminophen (TYLENOL) 500 MG tablet Take 500 mg by mouth every 6 (six) hours as needed for moderate pain or headache.     aspirin 81 MG EC tablet Take 1 tablet (81 mg total) by mouth daily. Swallow whole. 30 tablet 12   Bioflavonoid Products (ESTER C PO) Take 1,000 mg by mouth daily.     Cholecalciferol (D3-1000) 25 MCG (1000 UT) capsule Take 125 Units by mouth daily.     Cranberry-Vitamin C-Vitamin E 4200-20-3 MG-MG-UNIT CAPS Take 500 mg by mouth daily.     Garlic 3662 MG CAPS Take 1,000 mg by mouth daily.      lidocaine-prilocaine (EMLA) cream      Multiple Vitamin (MULTIVITAMIN) tablet Take 1 tablet by mouth daily.     Omega-3 Fatty Acids (FISH OIL) 1200 MG CAPS Take 1,000 mg by mouth daily.     OVER THE COUNTER MEDICATION Take 1,000 mg by mouth daily. [place] 1000 mg daily     OVER THE COUNTER  MEDICATION Take 1,000 mg by mouth daily. L-Lysine 1068m po daily     pramoxine-hydrocortisone (PROCTOCREAM-HC) 1-1 % rectal cream Place 1 application rectally 2 (two) times daily. 30 g 0   zinc gluconate 50 MG tablet Take 50 mg by mouth daily.  No current facility-administered medications for this visit.    ALLERGIES:  Allergies  Allergen Reactions   Sulfa Antibiotics Other (See Comments)    "Sores in my mouth"    PHYSICAL EXAM:  Performance status (ECOG): 1 - Symptomatic but completely ambulatory  There were no vitals filed for this visit.  Wt Readings from Last 3 Encounters:  05/04/22 158 lb (71.7 kg)  04/13/22 155 lb 1.6 oz (70.4 kg)  03/23/22 156 lb 12.8 oz (71.1 kg)   Physical Exam Vitals reviewed.  Constitutional:      Appearance: Normal appearance.  Cardiovascular:     Rate and Rhythm: Normal rate and regular rhythm.     Pulses: Normal pulses.     Heart sounds: Normal heart sounds.  Pulmonary:     Effort: Pulmonary effort is normal.     Breath sounds: Normal breath sounds.  Neurological:     General: No focal deficit present.     Mental Status: She is alert and oriented to person, place, and time.  Psychiatric:        Mood and Affect: Mood normal.        Behavior: Behavior normal.    LABORATORY DATA:  I have reviewed the labs as listed.     Latest Ref Rng & Units 05/25/2022   10:12 AM 05/04/2022    1:24 PM 04/13/2022   12:01 PM  CBC  WBC 4.0 - 10.5 K/uL 6.0  5.9  5.6   Hemoglobin 12.0 - 15.0 g/dL 13.7  13.1  13.3   Hematocrit 36.0 - 46.0 % 41.9  39.4  40.6   Platelets 150 - 400 K/uL 195  189  194       Latest Ref Rng & Units 05/25/2022   10:12 AM 05/04/2022    1:24 PM 04/13/2022   12:01 PM  CMP  Glucose 70 - 99 mg/dL 99  96  96   BUN 8 - 23 mg/dL _0 Creatinine 0.44 - 1.00 mg/dL 0.66  0.56  0.66   Sodium 135 - 145 mmol/L 138  138  137   Potassium 3.5 - 5.1 mmol/L 3.8  3.7  3.8   Chloride 98 - 111 mmol/L 104  104  102   CO2 22 - 32  mmol/L _1 Calcium 8.9 - 10.3 mg/dL 9.4  9.1  9.2   Total Protein 6.5 - 8.1 g/dL 8.2  7.8  8.0   Total Bilirubin 0.3 - 1.2 mg/dL 0.4  0.9  0.7   Alkaline Phos 38 - 126 U/L 70  66  67   AST 15 - 41 U/L _2 ALT 0 - 44 U/L _3 DIAGNOSTIC IMAGING:  I have independently reviewed the scans and discussed with the patient. CT CHEST ABDOMEN PELVIS W CONTRAST  Result Date: 05/20/2022 CLINICAL DATA:  Metastatic disease evaluation, colon cancer, status post right hemicolectomy, ongoing chemotherapy * Tracking Code: BO * EXAM: CT CHEST, ABDOMEN, AND PELVIS WITH CONTRAST TECHNIQUE: Multidetector CT imaging of the chest, abdomen and pelvis was performed following the standard protocol during bolus administration of intravenous contrast. RADIATION DOSE REDUCTION: This exam was performed according to the departmental dose-optimization program which includes automated exposure control, adjustment of the mA and/or kV according to patient size and/or use of iterative reconstruction technique. CONTRAST:  143m OMNIPAQUE IOHEXOL 300 MG/ML SOLN additional oral enteric contrast COMPARISON:  02/06/2022 FINDINGS: CT CHEST FINDINGS Cardiovascular: Right chest port catheter. Normal heart size. No pericardial effusion. Mediastinum/Nodes: Multiple enlarged mediastinal and left supraclavicular lymph nodes are unchanged, index left supraclavicular node measuring 3.4 x 2.0 cm (series 2, image 10). Index pretracheal node measures 1.9 x 1.8 cm (series 2, image 25). Right periaortic node measures 2.7 x 1.9 cm (series 2, image). Thyroid gland, trachea, and esophagus demonstrate no significant findings. Lungs/Pleura: Numerous bilateral pulmonary nodules are unchanged, index nodule in the right lower lobe measuring 1.1 x 0.9 cm (series 3, image 87), additional index nodule in the dependent left lower lobe measuring 0.8 x 0.8 cm (series 3, image 128). No pleural effusion or pneumothorax. Musculoskeletal: No chest  wall abnormality. No acute osseous findings. CT ABDOMEN PELVIS FINDINGS Hepatobiliary: No solid liver abnormality is seen. No gallstones, gallbladder wall thickening, or biliary dilatation. Pancreas: Unremarkable. No pancreatic ductal dilatation or surrounding inflammatory changes. Spleen: Normal in size without significant abnormality. Adrenals/Urinary Tract: Adrenal glands are unremarkable. Kidneys are normal, without renal calculi, solid lesion, or hydronephrosis. Bladder is unremarkable. Stomach/Bowel: Stomach is within normal limits. Status post right hemicolectomy and reanastomosis. No evidence of bowel wall thickening, distention, or inflammatory changes. Vascular/Lymphatic: No significant vascular findings are present. Numerous bulky, matted retroperitoneal lymph nodes are unchanged, index left retroperitoneal node measuring 2.8 x 1.8 cm (series 2, image 71). Unchanged lymph node conglomerate or soft tissue mass in the central small bowel mesentery, again encasing the superior mesenteric artery measuring 5.8 x 4.2 cm (series 2, image 77), as well as multiple smaller lymph nodes in the small bowel mesentery. Reproductive: No mass. Prominent bilateral ovarian and adnexal varices (series 2, image 109). Left ovarian vein measures up to 1.0 cm in caliber near the confluence with the left renal vein. Other: No abdominal wall hernia or abnormality. No ascites. Musculoskeletal: No acute osseous findings. IMPRESSION: 1. Status post right hemicolectomy and reanastomosis. 2. Numerous unchanged pulmonary nodules. 3. Unchanged left supraclavicular, mediastinal, and retroperitoneal lymphadenopathy. 4. Unchanged lymph node conglomerate or soft tissue mass in the central small bowel mesentery, as well as multiple smaller lymph nodes in the small bowel mesentery. 5. Constellation of findings is consistent with unchanged metastatic disease. 6. No evidence of new metastatic disease in the chest, abdomen, or pelvis. 7.  Prominent bilateral ovarian and adnexal varices, which again can be seen in pelvic congestion if referable signs and symptoms are present. Electronically Signed   By: Delanna Ahmadi M.D.   On: 05/20/2022 10:58     ASSESSMENT:  1.  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 - Status post 3 cycles of FOLFOX from 02/10/2014 through 03/10/2014, discontinued secondary to poor tolerance.  DPD was negative. -Last colonoscopy on 03/28/2018 patent ileo-colonic anastomosis.  8 mm polyp in the transverse colon.  Diverticulosis in the sigmoid colon, descending colon. - CT scan on 11/04/2018 shows postoperative findings of right colectomy with redemonstrated mesenteric nodule/lymph node measuring 1.9 x 1.7 cm, unchanged from prior scan in October 2019.  No evidence of metastatic disease.  Stable solid and groundglass pulmonary nodules, stable over multiple prior exams. -CEA was 4.6 on 10/28/2018. -CTAP on 09/21/2020 showed numerous small pulmonary nodules throughout the lung bases, 5 mm.  Enlarged retrocrural periaortic lymph node in the lower chest.  Soft tissue nodule within the central mesentery substantially increased in size, encasing proximal superior mesenteric artery and measuring 5 x  4.3 cm.  Numerous retroperitoneal lymph nodes, largest aortocaval lymph node measuring 2.8 x 2.2 cm. -CT chest on 09/27/2020 shows a left thoracic inlet lymph node approximately 2.3 cm.  Mediastinal lymph nodes and multiple subcentimeter pulmonary nodules suggestive of metastatic disease. -Mesenteric lymph node biopsy on 09/28/2020 shows mucin and fibrosis. -Left supraclavicular lymph node biopsy on 10/14/2020-soft tissue with abundant mucin, rare fragments of atypical columnar epithelium. - 3 cycles of FOLFIRI and bevacizumab dose reduced from 11/10/2020 through 12/08/2020. - Caris test-BRAF V600  E+, MMR deficient, MSI high, TMB high, HER2 negative, BRCA1/2 pathogenic variant on exon 14 and exon 9 respectively.  The report also suggested decreased benefit to FOLFOX and bevacizumab in the first-line metastatic setting. - Cycle 1 of Keytruda on 01/13/2021.   2.  Health maintenance: -Mammogram dated 05/16/2018 was BI-RADS Category 1.   PLAN:  1.  Metastatic colon cancer to the liver, lungs and lymph nodes, BRAF V600E+: - CT CAP (05/18/2022): Stable pulmonary nodules, left supraclavicular, mediastinal and retroperitoneal adenopathy.  Stable lymph node conglomerate in the central small bowel mesentery.  No new metastatic disease.  Prominent bilateral ovarian/adnexal varices. - Reviewed labs today which showed normal LFTs and CBC.  TSH is 2.5. - Last CEA was 5.4. - We discussed the treatment plan of continuing Keytruda until progression or intolerance. - Proceed with Keytruda today and in 3 weeks.  RTC 6 weeks for follow-up.   2.  Diarrhea: - Baseline diarrhea is stable.  Continue Imodium as needed.   Orders placed this encounter:  No orders of the defined types were placed in this encounter.     Derek Jack, MD [place] [phone]

## 2022-05-25 NOTE — Patient Instructions (Signed)
[facility]  Discharge Instructions: Thank you for choosing [facility] to provide your oncology and hematology care.  If you have a lab appointment with the Cancer Center, please come in thru the Main Entrance and check in at the main information desk.  Wear comfortable clothing and clothing appropriate for easy access to any Portacath or PICC line.   We strive to give you quality time with your provider. You may need to reschedule your appointment if you arrive late (15 or more minutes).  Arriving late affects you and other patients whose appointments are after yours.  Also, if you miss three or more appointments without notifying the office, you may be dismissed from the clinic at the provider's discretion.      For prescription refill requests, have your pharmacy contact our office and allow 72 hours for refills to be completed.    Today you received the following chemotherapy and/or immunotherapy agents Keytruda      To help prevent nausea and vomiting after your treatment, we encourage you to take your nausea medication as directed.  BELOW ARE SYMPTOMS THAT SHOULD BE REPORTED IMMEDIATELY: *FEVER GREATER THAN 100.4 F (38 C) OR HIGHER *CHILLS OR SWEATING *NAUSEA AND VOMITING THAT IS NOT CONTROLLED WITH YOUR NAUSEA MEDICATION *UNUSUAL SHORTNESS OF BREATH *UNUSUAL BRUISING OR BLEEDING *URINARY PROBLEMS (pain or burning when urinating, or frequent urination) *BOWEL PROBLEMS (unusual diarrhea, constipation, pain near the anus) TENDERNESS IN MOUTH AND THROAT WITH OR WITHOUT PRESENCE OF ULCERS (sore throat, sores in mouth, or a toothache) UNUSUAL RASH, SWELLING OR PAIN  UNUSUAL VAGINAL DISCHARGE OR ITCHING   Items with * indicate a potential emergency and should be followed up as soon as possible or go to the Emergency Department if any problems should occur.  Please show the CHEMOTHERAPY ALERT CARD or IMMUNOTHERAPY ALERT CARD at check-in to the  Emergency Department and triage nurse.  Should you have questions after your visit or need to cancel or reschedule your appointment, please contact [facility] [phone]  and follow the prompts.  Office hours are 8:00 a.m. to 4:30 p.m. Monday - Friday. Please note that voicemails left after 4:00 p.m. may not be returned until the following business day.  We are closed weekends and major holidays. You have access to a nurse at all times for urgent questions. Please call the main number to the clinic [phone] and follow the prompts.  For any non-urgent questions, you may also contact your provider using MyChart. We now offer e-Visits for anyone 18 and older to request care online for non-urgent symptoms. For details visit [email or web address].   Also download the MyChart app! Go to the app store, search "MyChart", open the app, select , and log in with your MyChart username and password.  Masks are optional in the cancer centers. If you would like for your care team to wear a mask while they are taking care of you, please let them know. You may have one support person who is at least 76 years old accompany you for your appointments.  

## 2022-05-27 LAB — T4: T4, Total: 8.8 ug/dL (ref 4.5–12.0)

## 2022-05-30 ENCOUNTER — Other Ambulatory Visit: Payer: Self-pay

## 2022-06-06 ENCOUNTER — Other Ambulatory Visit: Payer: Self-pay

## 2022-06-14 ENCOUNTER — Other Ambulatory Visit: Payer: Self-pay

## 2022-06-14 DIAGNOSIS — C189 Malignant neoplasm of colon, unspecified: Secondary | ICD-10-CM

## 2022-06-15 ENCOUNTER — Inpatient Hospital Stay: Payer: Medicare Other

## 2022-06-15 ENCOUNTER — Ambulatory Visit: Payer: Medicare Other | Admitting: Hematology

## 2022-06-15 ENCOUNTER — Other Ambulatory Visit: Payer: Medicare Other

## 2022-06-15 ENCOUNTER — Inpatient Hospital Stay: Payer: Medicare Other | Attending: Hematology

## 2022-06-15 VITALS — BP 134/50 | HR 57 | Temp 98.6°F | Resp 18

## 2022-06-15 DIAGNOSIS — C787 Secondary malignant neoplasm of liver and intrahepatic bile duct: Secondary | ICD-10-CM | POA: Insufficient documentation

## 2022-06-15 DIAGNOSIS — Z79899 Other long term (current) drug therapy: Secondary | ICD-10-CM | POA: Insufficient documentation

## 2022-06-15 DIAGNOSIS — Z5112 Encounter for antineoplastic immunotherapy: Secondary | ICD-10-CM | POA: Diagnosis not present

## 2022-06-15 DIAGNOSIS — C189 Malignant neoplasm of colon, unspecified: Secondary | ICD-10-CM

## 2022-06-15 DIAGNOSIS — Z95828 Presence of other vascular implants and grafts: Secondary | ICD-10-CM

## 2022-06-15 DIAGNOSIS — C184 Malignant neoplasm of transverse colon: Secondary | ICD-10-CM | POA: Insufficient documentation

## 2022-06-15 LAB — COMPREHENSIVE METABOLIC PANEL
ALT: 23 U/L (ref 0–44)
AST: 29 U/L (ref 15–41)
Albumin: 4.3 g/dL (ref 3.5–5.0)
Alkaline Phosphatase: 70 U/L (ref 38–126)
Anion gap: 9 (ref 5–15)
BUN: 12 mg/dL (ref 8–23)
CO2: 25 mmol/L (ref 22–32)
Calcium: 9.4 mg/dL (ref 8.9–10.3)
Chloride: 103 mmol/L (ref 98–111)
Creatinine, Ser: 0.65 mg/dL (ref 0.44–1.00)
GFR, Estimated: >60 mL/min (ref >60)
Glucose, Bld: 96 mg/dL (ref 70–99)
Potassium: 4.4 mmol/L (ref 3.5–5.1)
Sodium: 137 mmol/L (ref 135–145)
Total Bilirubin: 0.4 mg/dL (ref 0.3–1.2)
Total Protein: 8.3 g/dL — ABNORMAL HIGH (ref 6.5–8.1)

## 2022-06-15 LAB — CBC WITH DIFFERENTIAL/PLATELET
Abs Immature Granulocytes: 0.03 10*3/uL (ref 0.00–0.07)
Basophils Absolute: 0 10*3/uL (ref 0.0–0.1)
Basophils Relative: 1 %
Eosinophils Absolute: 0.1 10*3/uL (ref 0.0–0.5)
Eosinophils Relative: 2 %
HCT: 40.5 % (ref 36.0–46.0)
Hemoglobin: 13.3 g/dL (ref 12.0–15.0)
Immature Granulocytes: 0 %
Lymphocytes Relative: 20 %
Lymphs Abs: 1.3 10*3/uL (ref 0.7–4.0)
MCH: 30.2 pg (ref 26.0–34.0)
MCHC: 32.8 g/dL (ref 30.0–36.0)
MCV: 92 fL (ref 80.0–100.0)
Monocytes Absolute: 0.6 10*3/uL (ref 0.1–1.0)
Monocytes Relative: 10 %
Neutro Abs: 4.5 10*3/uL (ref 1.7–7.7)
Neutrophils Relative %: 67 %
Platelets: 191 10*3/uL (ref 150–400)
RBC: 4.4 MIL/uL (ref 3.87–5.11)
RDW: 12.7 % (ref 11.5–15.5)
WBC: 6.7 10*3/uL (ref 4.0–10.5)
nRBC: 0 % (ref 0.0–0.2)

## 2022-06-15 LAB — TSH: TSH: 1.483 u[IU]/mL (ref 0.350–4.500)

## 2022-06-15 LAB — MAGNESIUM: Magnesium: 2.2 mg/dL (ref 1.7–2.4)

## 2022-06-15 MED ORDER — HEPARIN SOD (PORK) LOCK FLUSH 100 UNIT/ML IV SOLN
500.0000 [IU] | Freq: Once | INTRAVENOUS | Status: AC | PRN
  Administered 2022-06-15: 500 [IU]

## 2022-06-15 MED ORDER — SODIUM CHLORIDE 0.9 % IV SOLN: Freq: Once | INTRAVENOUS | Status: AC

## 2022-06-15 MED ORDER — SODIUM CHLORIDE 0.9% FLUSH
10.0000 mL | INTRAVENOUS | Status: DC | PRN
  Administered 2022-06-15: 10 mL

## 2022-06-15 MED ORDER — SODIUM CHLORIDE 0.9 % IV SOLN
200.0000 mg | Freq: Once | INTRAVENOUS | Status: AC
  Administered 2022-06-15: 200 mg via INTRAVENOUS
  Filled 2022-06-15: qty 8

## 2022-06-15 NOTE — Patient Instructions (Signed)
[facility]  Discharge Instructions: Thank you for choosing [facility] to provide your oncology and hematology care.  If you have a lab appointment with the Cancer Center, please come in thru the Main Entrance and check in at the main information desk.  Wear comfortable clothing and clothing appropriate for easy access to any Portacath or PICC line.   We strive to give you quality time with your provider. You may need to reschedule your appointment if you arrive late (15 or more minutes).  Arriving late affects you and other patients whose appointments are after yours.  Also, if you miss three or more appointments without notifying the office, you may be dismissed from the clinic at the provider's discretion.      For prescription refill requests, have your pharmacy contact our office and allow 72 hours for refills to be completed.    Today you received the following chemotherapy and/or immunotherapy agents Keytruda, return as scheduled.   To help prevent nausea and vomiting after your treatment, we encourage you to take your nausea medication as directed.  BELOW ARE SYMPTOMS THAT SHOULD BE REPORTED IMMEDIATELY: *FEVER GREATER THAN 100.4 F (38 C) OR HIGHER *CHILLS OR SWEATING *NAUSEA AND VOMITING THAT IS NOT CONTROLLED WITH YOUR NAUSEA MEDICATION *UNUSUAL SHORTNESS OF BREATH *UNUSUAL BRUISING OR BLEEDING *URINARY PROBLEMS (pain or burning when urinating, or frequent urination) *BOWEL PROBLEMS (unusual diarrhea, constipation, pain near the anus) TENDERNESS IN MOUTH AND THROAT WITH OR WITHOUT PRESENCE OF ULCERS (sore throat, sores in mouth, or a toothache) UNUSUAL RASH, SWELLING OR PAIN  UNUSUAL VAGINAL DISCHARGE OR ITCHING   Items with * indicate a potential emergency and should be followed up as soon as possible or go to the Emergency Department if any problems should occur.  Please show the CHEMOTHERAPY ALERT CARD or IMMUNOTHERAPY ALERT CARD at  check-in to the Emergency Department and triage nurse.  Should you have questions after your visit or need to cancel or reschedule your appointment, please contact [facility] [phone]  and follow the prompts.  Office hours are 8:00 a.m. to 4:30 p.m. Monday - Friday. Please note that voicemails left after 4:00 p.m. may not be returned until the following business day.  We are closed weekends and major holidays. You have access to a nurse at all times for urgent questions. Please call the main number to the clinic [phone] and follow the prompts.  For any non-urgent questions, you may also contact your provider using MyChart. We now offer e-Visits for anyone 18 and older to request care online for non-urgent symptoms. For details visit [email or web address].   Also download the MyChart app! Go to the app store, search "MyChart", open the app, select [facility], and log in with your MyChart username and password.  Masks are optional in the cancer centers. If you would like for your care team to wear a mask while they are taking care of you, please let them know. You may have one support person who is at least 76 years old accompany you for your appointments.  

## 2022-06-15 NOTE — Progress Notes (Signed)
Patient tolerated therapy with no complaints voiced.  Side effects with management reviewed with understanding verbalized.  Port site clean and dry with no bruising or swelling noted at site.  Good blood return noted before and after administration of therapy.  Band aid applied.  Patient left in satisfactory condition with VSS and no s/s of distress noted.  

## 2022-06-17 LAB — CEA: CEA: 5.6 ng/mL — ABNORMAL HIGH (ref 0.0–4.7)

## 2022-07-03 ENCOUNTER — Other Ambulatory Visit: Payer: Self-pay

## 2022-07-05 ENCOUNTER — Other Ambulatory Visit: Payer: Self-pay

## 2022-07-05 DIAGNOSIS — C189 Malignant neoplasm of colon, unspecified: Secondary | ICD-10-CM

## 2022-07-06 ENCOUNTER — Inpatient Hospital Stay: Payer: Medicare Other

## 2022-07-06 ENCOUNTER — Inpatient Hospital Stay (HOSPITAL_BASED_OUTPATIENT_CLINIC_OR_DEPARTMENT_OTHER): Payer: Medicare Other | Admitting: Hematology

## 2022-07-06 VITALS — BP 128/76 | HR 67 | Temp 97.8°F

## 2022-07-06 DIAGNOSIS — C184 Malignant neoplasm of transverse colon: Secondary | ICD-10-CM | POA: Diagnosis not present

## 2022-07-06 DIAGNOSIS — Z79899 Other long term (current) drug therapy: Secondary | ICD-10-CM | POA: Diagnosis not present

## 2022-07-06 DIAGNOSIS — Z95828 Presence of other vascular implants and grafts: Secondary | ICD-10-CM

## 2022-07-06 DIAGNOSIS — C787 Secondary malignant neoplasm of liver and intrahepatic bile duct: Secondary | ICD-10-CM | POA: Diagnosis not present

## 2022-07-06 DIAGNOSIS — C189 Malignant neoplasm of colon, unspecified: Secondary | ICD-10-CM

## 2022-07-06 DIAGNOSIS — Z5112 Encounter for antineoplastic immunotherapy: Secondary | ICD-10-CM | POA: Diagnosis not present

## 2022-07-06 DIAGNOSIS — E064 Drug-induced thyroiditis: Secondary | ICD-10-CM

## 2022-07-06 LAB — CBC WITH DIFFERENTIAL/PLATELET
Abs Immature Granulocytes: 0.03 10*3/uL (ref 0.00–0.07)
Basophils Absolute: 0.1 10*3/uL (ref 0.0–0.1)
Basophils Relative: 1 %
Eosinophils Absolute: 0.1 10*3/uL (ref 0.0–0.5)
Eosinophils Relative: 2 %
HCT: 41.2 % (ref 36.0–46.0)
Hemoglobin: 13.3 g/dL (ref 12.0–15.0)
Immature Granulocytes: 0 %
Lymphocytes Relative: 18 %
Lymphs Abs: 1.3 10*3/uL (ref 0.7–4.0)
MCH: 30.2 pg (ref 26.0–34.0)
MCHC: 32.3 g/dL (ref 30.0–36.0)
MCV: 93.6 fL (ref 80.0–100.0)
Monocytes Absolute: 0.7 10*3/uL (ref 0.1–1.0)
Monocytes Relative: 9 %
Neutro Abs: 5 10*3/uL (ref 1.7–7.7)
Neutrophils Relative %: 70 %
Platelets: 202 10*3/uL (ref 150–400)
RBC: 4.4 MIL/uL (ref 3.87–5.11)
RDW: 13.2 % (ref 11.5–15.5)
WBC: 7.2 10*3/uL (ref 4.0–10.5)
nRBC: 0 % (ref 0.0–0.2)

## 2022-07-06 LAB — COMPREHENSIVE METABOLIC PANEL
ALT: 18 U/L (ref 0–44)
AST: 24 U/L (ref 15–41)
Albumin: 4.3 g/dL (ref 3.5–5.0)
Alkaline Phosphatase: 76 U/L (ref 38–126)
Anion gap: 7 (ref 5–15)
BUN: 13 mg/dL (ref 8–23)
CO2: 28 mmol/L (ref 22–32)
Calcium: 9.4 mg/dL (ref 8.9–10.3)
Chloride: 105 mmol/L (ref 98–111)
Creatinine, Ser: 0.62 mg/dL (ref 0.44–1.00)
GFR, Estimated: >60 mL/min (ref >60)
Glucose, Bld: 101 mg/dL — ABNORMAL HIGH (ref 70–99)
Potassium: 4.2 mmol/L (ref 3.5–5.1)
Sodium: 140 mmol/L (ref 135–145)
Total Bilirubin: 0.6 mg/dL (ref 0.3–1.2)
Total Protein: 8.2 g/dL — ABNORMAL HIGH (ref 6.5–8.1)

## 2022-07-06 LAB — TSH: TSH: 1.787 u[IU]/mL (ref 0.350–4.500)

## 2022-07-06 LAB — MAGNESIUM: Magnesium: 2.2 mg/dL (ref 1.7–2.4)

## 2022-07-06 MED ORDER — SODIUM CHLORIDE 0.9 % IV SOLN
200.0000 mg | Freq: Once | INTRAVENOUS | Status: AC
  Administered 2022-07-06: 200 mg via INTRAVENOUS
  Filled 2022-07-06: qty 8

## 2022-07-06 MED ORDER — HEPARIN SOD (PORK) LOCK FLUSH 100 UNIT/ML IV SOLN
500.0000 [IU] | Freq: Once | INTRAVENOUS | Status: AC | PRN
  Administered 2022-07-06: 500 [IU]

## 2022-07-06 MED ORDER — SODIUM CHLORIDE 0.9% FLUSH
10.0000 mL | INTRAVENOUS | Status: DC | PRN
  Administered 2022-07-06: 10 mL

## 2022-07-06 MED ORDER — SODIUM CHLORIDE 0.9 % IV SOLN: Freq: Once | INTRAVENOUS | Status: AC

## 2022-07-06 NOTE — Progress Notes (Signed)
[place], [place] [address]   CLINIC:  Medical Oncology/Hematology  PCP:  [facility], [place] / [place] [address] [phone]   REASON FOR VISIT:  Follow-up for transverse colon adenocarcinoma  PRIOR THERAPY:  1. Right hemicolectomy on 12/29/2013. 2. FOLFOX x 3 cycles from 02/10/2014 to 03/10/2014, stopped secondary to poor tolerance  NGS Results: MSI high, MMR deficient, BRAF V600E, TMB high  CURRENT THERAPY: Keytruda every 3 weeks  BRIEF ONCOLOGIC HISTORY:  Oncology History  Adenocarcinoma of transverse colon ([place]) (Resolved)  12/09/2013 Initial Diagnosis   1. Colon, biopsy, proximal transverse mass INVASIVE ADENOCARCINOMA.   12/29/2013 Definitive Surgery   Colon, segmental resection for tumor, right - INVASIVE COLORECTAL ADENOCARCINOMA, 7 CM, EXTENDING INTO PERICOLONIC CONNECTIVE TISSUE. - METASTATIC CARCINOMA IN 6 OF 33 LYMPH NODES (6/33).   02/10/2014 - 03/10/2014 Adjuvant Chemotherapy   FOLFOX x 3 cycles   03/11/2014 Adverse Reaction   Patient called reporting diarrhea despite dose reduction and she wants to stop therapy.  Patient seen on 03/24/14 to discuss other treatment options and she stands by her decision to stop all therapy.   04/01/2014 Surgery   Port-a-cath removal by Dr. Arnoldo Morale.   01/12/2015 PET scan   Mild abdominal mesenteric LAD show hypermetabolic activity, 7 mm indeterminate pulm nodule in sup segment of RLL shows no assoc metabolic activity   9/52/8413 PET scan   Mild progression of nodal mets in anterior mid abdominal mesentery, new nodal mets at L thoracic inlet. stable 6 mm nodule in posterior RLL, unchanged since 2015   06/22/2015 PET scan   Resolution of metabolic activity and decreased size of mild LAD at L thoracic inlet, stable mild mid abd hypermetabolic mesenteric LAD, stable 6 mm posterior LLL pulm nodule without metabolic activity   2/44/0102 Imaging   MRI lumbar spine L4-L5 disc  degenerated, broad based disc hernation, B facet arthropathy with hypertophy and edema, stenosis of lateral recesses that could cause neural compression   10/18/2016 Imaging   CT CAP- 1. Several small ground-glass pulmonary nodules are present in the lungs and are stable over the past 9 months, but several are mildly larger than they were in 2015. Low-grade adenocarcinoma can sometimes have this appearance and surveillance is likely warranted. 2. There is a cluster of nodal tissue in the central mesentery, maximum short axis diameter 1.3 cm. This nodal cluster is relatively low in density and not appreciably changed from the prior exam. Given the lack of change is may represent quiescent residua of malignancy, but again, surveillance is likely warranted. 3. The left-sided rib fractures are more numerous than was revealed on the prior radiographs ; there are nondisplaced healing fractures of the left fifth, sixth, seventh, eighth, ninth, and tenth ribs. 4. Mild cardiomegaly although part of this appearance may be due to pectus excavatum. 5. Several additional small pulmonary nodules are stable from 2015 and considered benign. 6. Right hemicolectomy and sigmoid colon diverticulosis. 7. Small right paraumbilical hernia containing adipose tissue. 8. Lower lumbar spondylosis and degenerative disc disease potentially with mild impingement at L4-5.   04/25/2017 Imaging   CT C/A/P: Scattered solid pulmonary nodules measure up to 6 mm in the right lower lobe, unchanged. There are a few scattered ground-glass nodules measuring up to 8 mm in the right upper lobe, also stable. Distal perigastric lymph nodes are stable. No additional evidence of metastatic disease.   Metastatic colon cancer to liver ([place])  11/04/2020 Initial Diagnosis  Metastatic colon cancer to liver ([place])   11/10/2020 - 12/10/2020 Chemotherapy         01/13/2021 - 03/02/2022 Chemotherapy   Patient is on Treatment Plan : COLORECTAL  Pembrolizumab q21d     01/13/2021 -  Chemotherapy   Patient is on Treatment Plan : COLORECTAL Pembrolizumab (200) q21d       CANCER STAGING:  Cancer Staging  No matching staging information was found for the patient.  INTERVAL HISTORY:  [name], a 76 y.o. female, seen for follow-up of metastatic colon cancer.  She is tolerating Keytruda very well.  Chronic diarrhea has been stable.  Energy levels are 70%.  Denies any dyspnea or dry cough.  No fevers or infections reported.  REVIEW OF SYSTEMS:  Review of Systems  Gastrointestinal:  Positive for diarrhea (stable).  All other systems reviewed and are negative.   PAST MEDICAL/SURGICAL HISTORY:  Past Medical History:  Diagnosis Date   Arthritis    wrist and thumbs   Colon cancer (HCC)    PONV (postoperative nausea and vomiting)    Port-A-Cath in place 11/09/2020   Ruptured lumbar disc    L1-L5 per patient report   Sciatica of left side    Past Surgical History:  Procedure Laterality Date   COLONOSCOPY N/A 12/12/2013   Procedure: COLONOSCOPY;  Surgeon: Rogene Houston, MD;  [facility];  Service: Endoscopy;  Laterality: N/A;  730   COLONOSCOPY N/A 01/20/2015   Procedure: COLONOSCOPY;  Surgeon: Rogene Houston, MD;  [facility];  Service: Endoscopy;  Laterality: N/A;  1030   COLONOSCOPY N/A 03/28/2018   Procedure: COLONOSCOPY;  Surgeon: Rogene Houston, MD;  [facility];  Service: Endoscopy;  Laterality: N/A;  1015   EUS N/A 12/18/2013   Procedure: UPPER ENDOSCOPIC ULTRASOUND (EUS) LINEAR;  Surgeon: Milus Banister, MD;  [facility]: [facility];  Service: Endoscopy;  Laterality: N/A;   herniated disc     1996-[id number]   IR IMAGING GUIDED PORT INSERTION  10/14/2020   IR US GUIDANCE  10/14/2020   PARTIAL COLECTOMY N/A 12/29/2013   Procedure: RIGHT HEMICOLECTOMY;  Surgeon: Jamesetta So, MD;  Location: [facility];  Service: General;  Laterality: N/A;   POLYPECTOMY  03/28/2018   Procedure: POLYPECTOMY;   Surgeon: Rogene Houston, MD;  [facility];  Service: Endoscopy;;  transverse colon (hot snare);   PORT-A-CATH REMOVAL Right 04/01/2014   Procedure: MINOR REMOVAL PORT-A-CATH;  Surgeon: Jamesetta So, MD;  Location: [facility];  Service: General;  Laterality: Right;   PORTACATH PLACEMENT Right 02/04/2014   Procedure: INSERTION PORT-A-CATH;  Surgeon: Jamesetta So, MD;  Location: [facility];  Service: General;  Laterality: Right;    SOCIAL HISTORY:  Social History   Socioeconomic History   Marital status: Married    Spouse name: Not on file   Number of children: Not on file   Years of education: Not on file   Highest education level: Not on file  Occupational History   Not on file  Tobacco Use   Smoking status: Former    Packs/day: 0.25    Years: 5.00    Total pack years: 1.25    Types: Cigarettes    Quit date: 04/19/1959    Years since quitting: 63.2   Smokeless tobacco: Never   Tobacco comments:    smoked for 5 years as teenager  Vaping Use   Vaping Use: Never used  Substance and Sexual Activity  Alcohol use: No   Drug use: No   Sexual activity: Yes    Birth control/protection: Post-menopausal  Other Topics Concern   Not on file  Social History Narrative   Not on file   Social Determinants of Health   Financial Resource Strain: Low Risk  (10/04/2020)   Overall Financial Resource Strain (CARDIA)    Difficulty of Paying Living Expenses: Not hard at all  Food Insecurity: No Food Insecurity (10/04/2020)   Hunger Vital Sign    Worried About Running Out of Food in the Last Year: Never true    Ran Out of Food in the Last Year: Never true  Transportation Needs: No Transportation Needs (10/04/2020)   PRAPARE - Hydrologist (Medical): No    Lack of Transportation (Non-Medical): No  Physical Activity: Inactive (10/04/2020)   Exercise Vital Sign    Days of Exercise per Week: 0 days    Minutes of Exercise per Session: 0 min  Stress: Stress  Concern Present (10/04/2020)   [place]    Feeling of Stress : To some extent  Social Connections: Socially Integrated (10/04/2020)   Social Connection and Isolation Panel [NHANES]    Frequency of Communication with Friends and Family: More than three times a week    Frequency of Social Gatherings with Friends and Family: More than three times a week    Attends Religious Services: More than 4 times per year    Active Member of [employer] or Organizations: Yes    Attends Music therapist: More than 4 times per year    Marital Status: Married  Human resources officer Violence: Not At Risk (10/04/2020)   Humiliation, Afraid, Rape, and Kick questionnaire    Fear of Current or Ex-Partner: No    Emotionally Abused: No    Physically Abused: No    Sexually Abused: No    FAMILY HISTORY:  Family History  Problem Relation Age of Onset   Diabetes type II Mother    Dementia Father     CURRENT MEDICATIONS:  Current Outpatient Medications  Medication Sig Dispense Refill   acetaminophen (TYLENOL) 500 MG tablet Take 500 mg by mouth every 6 (six) hours as needed for moderate pain or headache.     aspirin 81 MG EC tablet Take 1 tablet (81 mg total) by mouth daily. Swallow whole. 30 tablet 12   Bioflavonoid Products (ESTER C PO) Take 1,000 mg by mouth daily.     Cholecalciferol (D3-1000) 25 MCG (1000 UT) capsule Take 125 Units by mouth daily.     Cranberry-Vitamin C-Vitamin E 4200-20-3 MG-MG-UNIT CAPS Take 500 mg by mouth daily.     Garlic 9678 MG CAPS Take 1,000 mg by mouth daily.      lidocaine-prilocaine (EMLA) cream      Multiple Vitamin (MULTIVITAMIN) tablet Take 1 tablet by mouth daily.     Omega-3 Fatty Acids (FISH OIL) 1200 MG CAPS Take 1,000 mg by mouth daily.     OVER THE COUNTER MEDICATION Take 1,000 mg by mouth daily. [place] 1000 mg daily     OVER THE COUNTER MEDICATION Take 1,000 mg by mouth daily. L-Lysine 1068m po  daily     pramoxine-hydrocortisone (PROCTOCREAM-HC) 1-1 % rectal cream Place 1 application rectally 2 (two) times daily. 30 g 0   zinc gluconate 50 MG tablet Take 50 mg by mouth daily.     No current facility-administered medications for this visit.  ALLERGIES:  Allergies  Allergen Reactions   Sulfa Antibiotics Other (See Comments)    "Sores in my mouth"    PHYSICAL EXAM:  Performance status (ECOG): 1 - Symptomatic but completely ambulatory  There were no vitals filed for this visit.  Wt Readings from Last 3 Encounters:  05/25/22 164 lb 0.4 oz (74.4 kg)  05/04/22 158 lb (71.7 kg)  04/13/22 155 lb 1.6 oz (70.4 kg)   Physical Exam Vitals reviewed.  Constitutional:      Appearance: Normal appearance.  Cardiovascular:     Rate and Rhythm: Normal rate and regular rhythm.     Pulses: Normal pulses.     Heart sounds: Normal heart sounds.  Pulmonary:     Effort: Pulmonary effort is normal.     Breath sounds: Normal breath sounds.  Neurological:     General: No focal deficit present.     Mental Status: She is alert and oriented to person, place, and time.  Psychiatric:        Mood and Affect: Mood normal.        Behavior: Behavior normal.     LABORATORY DATA:  I have reviewed the labs as listed.     Latest Ref Rng & Units 06/15/2022    1:25 PM 05/25/2022   10:12 AM 05/04/2022    1:24 PM  CBC  WBC 4.0 - 10.5 K/uL 6.7  6.0  5.9   Hemoglobin 12.0 - 15.0 g/dL 13.3  13.7  13.1   Hematocrit 36.0 - 46.0 % 40.5  41.9  39.4   Platelets 150 - 400 K/uL 191  195  189       Latest Ref Rng & Units 06/15/2022    1:25 PM 05/25/2022   10:12 AM 05/04/2022    1:24 PM  CMP  Glucose 70 - 99 mg/dL 96  99  96   BUN 8 - 23 mg/dL _0 Creatinine 0.44 - 1.00 mg/dL 0.65  0.66  0.56   Sodium 135 - 145 mmol/L 137  138  138   Potassium 3.5 - 5.1 mmol/L 4.4  3.8  3.7   Chloride 98 - 111 mmol/L 103  104  104   CO2 22 - 32 mmol/L _1 Calcium 8.9 - 10.3 mg/dL 9.4  9.4  9.1    Total Protein 6.5 - 8.1 g/dL 8.3  8.2  7.8   Total Bilirubin 0.3 - 1.2 mg/dL 0.4  0.4  0.9   Alkaline Phos 38 - 126 U/L 70  70  66   AST 15 - 41 U/L _2 ALT 0 - 44 U/L _3 DIAGNOSTIC IMAGING:  I have independently reviewed the scans and discussed with the patient. No results found.   ASSESSMENT:  1.  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 - Status post 3 cycles of FOLFOX from 02/10/2014 through 03/10/2014, discontinued secondary to poor tolerance.  DPD was negative. -Last colonoscopy on 03/28/2018 patent ileo-colonic anastomosis.  8 mm polyp in the transverse colon.  Diverticulosis in the sigmoid colon, descending colon. - CT scan on 11/04/2018 shows postoperative findings of right colectomy with redemonstrated mesenteric nodule/lymph node measuring 1.9 x 1.7 cm, unchanged from prior scan in October 2019.  No evidence of metastatic disease.  Stable  solid and groundglass pulmonary nodules, stable over multiple prior exams. -CEA was 4.6 on 10/28/2018. -CTAP on 09/21/2020 showed numerous small pulmonary nodules throughout the lung bases, 5 mm.  Enlarged retrocrural periaortic lymph node in the lower chest.  Soft tissue nodule within the central mesentery substantially increased in size, encasing proximal superior mesenteric artery and measuring 5 x 4.3 cm.  Numerous retroperitoneal lymph nodes, largest aortocaval lymph node measuring 2.8 x 2.2 cm. -CT chest on 09/27/2020 shows a left thoracic inlet lymph node approximately 2.3 cm.  Mediastinal lymph nodes and multiple subcentimeter pulmonary nodules suggestive of metastatic disease. -Mesenteric lymph node biopsy on 09/28/2020 shows mucin and fibrosis. -Left supraclavicular lymph node biopsy on 10/14/2020-soft tissue with abundant mucin, rare fragments of atypical columnar epithelium. - 3  cycles of FOLFIRI and bevacizumab dose reduced from 11/10/2020 through 12/08/2020. - [facility] test-BRAF V600 E+, MMR deficient, MSI high, TMB high, HER2 negative, BRCA1/2 pathogenic variant on exon 14 and exon 9 respectively.  The report also suggested decreased benefit to FOLFOX and bevacizumab in the first-line metastatic setting. - Cycle 1 of Keytruda on 01/13/2021.   2.  Health maintenance: -Mammogram dated 05/16/2018 was BI-RADS Category 1.   PLAN:  1.  Metastatic colon cancer to the liver, lungs and lymph nodes, BRAF V600E+: - CT CAP on 05/18/2022: Stable lung nodules, left supraclavicular, mediastinal and retroperitoneal adenopathy.  Stable lymph node conglomerate in the central small bowel mesentery.  No new metastatic disease. - Last CEA was 5.6 on 06/15/2022, stable between 5 and 6. - Labs today shows elevated total protein and normal LFTs.  CBC was normal.  TSH is 1.7. - Proceed with Keytruda today and every 3 weeks.  RTC 9 weeks with labs and treatment. - Will do scans in 6 months from the last.   2.  Diarrhea: - Baseline diarrhea is stable.  Continue Imodium as needed.   Orders placed this encounter:  No orders of the defined types were placed in this encounter.     Derek Jack, MD [place] [phone]

## 2022-07-06 NOTE — Progress Notes (Signed)
Patient tolerated therapy with no complaints voiced.  Side effects with management reviewed with understanding verbalized.  Port site clean and dry with no bruising or swelling noted at site.  Good blood return noted before and after administration of therapy.  Band aid applied.  Patient left in satisfactory condition with VSS and no s/s of distress noted.  

## 2022-07-06 NOTE — Patient Instructions (Signed)
[facility]  Discharge Instructions: Thank you for choosing [facility] to provide your oncology and hematology care.  If you have a lab appointment with the [facility], please come in thru the Main Entrance and check in at the main information desk.  Wear comfortable clothing and clothing appropriate for easy access to any Portacath or PICC line.   We strive to give you quality time with your provider. You may need to reschedule your appointment if you arrive late (15 or more minutes).  Arriving late affects you and other patients whose appointments are after yours.  Also, if you miss three or more appointments without notifying the office, you may be dismissed from the clinic at the provider's discretion.      For prescription refill requests, have your pharmacy contact our office and allow 72 hours for refills to be completed.    Today you received the following chemotherapy and/or immunotherapy agents keytruda.  Pembrolizumab Injection What is this medication? PEMBROLIZUMAB (PEM broe LIZ ue mab) treats some types of cancer. It works by helping your immune system slow or stop the spread of cancer cells. It is a monoclonal antibody. This medicine may be used for other purposes; ask your health care provider or pharmacist if you have questions. COMMON BRAND NAME(S): Keytruda What should I tell my care team before I take this medication? They need to know if you have any of these conditions: Allogeneic stem cell transplant (uses someone else's stem cells) Autoimmune diseases, such as Crohn disease, ulcerative colitis, lupus History of chest radiation Nervous system problems, such as Guillain-Barre syndrome, myasthenia gravis Organ transplant An unusual or allergic reaction to pembrolizumab, other medications, foods, dyes, or preservatives Pregnant or trying to get pregnant Breast-feeding How should I use this medication? This medication is injected  into a vein. It is given by your care team in a hospital or clinic setting. A special MedGuide will be given to you before each treatment. Be sure to read this information carefully each time. Talk to your care team about the use of this medication in children. While it may be prescribed for children as young as 6 months for selected conditions, precautions do apply. Overdosage: If you think you have taken too much of this medicine contact a poison control center or emergency room at once. NOTE: This medicine is only for you. Do not share this medicine with others. What if I miss a dose? Keep appointments for follow-up doses. It is important not to miss your dose. Call your care team if you are unable to keep an appointment. What may interact with this medication? Interactions have not been studied. This list may not describe all possible interactions. Give your health care provider a list of all the medicines, herbs, non-prescription drugs, or dietary supplements you use. Also tell them if you smoke, drink alcohol, or use illegal drugs. Some items may interact with your medicine. What should I watch for while using this medication? Your condition will be monitored carefully while you are receiving this medication. You may need blood work while taking this medication. This medication may cause serious skin reactions. They can happen weeks to months after starting the medication. Contact your care team right away if you notice fevers or flu-like symptoms with a rash. The rash may be red or purple and then turn into blisters or peeling of the skin. You may also notice a red rash with swelling of the face, lips, or lymph  nodes in your neck or under your arms. Tell your care team right away if you have any change in your eyesight. Talk to your care team if you may be pregnant. Serious birth defects can occur if you take this medication during pregnancy and for 4 months after the last dose. You will need a  negative pregnancy test before starting this medication. Contraception is recommended while taking this medication and for 4 months after the last dose. Your care team can help you find the option that works for you. Do not breastfeed while taking this medication and for 4 months after the last dose. What side effects may I notice from receiving this medication? Side effects that you should report to your care team as soon as possible: Allergic reactions--skin rash, itching, hives, swelling of the face, lips, tongue, or throat Dry cough, shortness of breath or trouble breathing Eye pain, redness, irritation, or discharge with blurry or decreased vision Heart muscle inflammation--unusual weakness or fatigue, shortness of breath, chest pain, fast or irregular heartbeat, dizziness, swelling of the ankles, feet, or hands Hormone gland problems--headache, sensitivity to light, unusual weakness or fatigue, dizziness, fast or irregular heartbeat, increased sensitivity to cold or heat, excessive sweating, constipation, hair loss, increased thirst or amount of urine, tremors or shaking, irritability Infusion reactions--chest pain, shortness of breath or trouble breathing, feeling faint or lightheaded Kidney injury (glomerulonephritis)--decrease in the amount of urine, red or dark brown urine, foamy or bubbly urine, swelling of the ankles, hands, or feet Liver injury--right upper belly pain, loss of appetite, nausea, light-colored stool, dark yellow or brown urine, yellowing skin or eyes, unusual weakness or fatigue Pain, tingling, or numbness in the hands or feet, muscle weakness, change in vision, confusion or trouble speaking, loss of balance or coordination, trouble walking, seizures Rash, fever, and swollen lymph nodes Redness, blistering, peeling, or loosening of the skin, including inside the mouth Sudden or severe stomach pain, bloody diarrhea, fever, nausea, vomiting Side effects that usually do not  require medical attention (report to your care team if they continue or are bothersome): Bone, joint, or muscle pain Diarrhea Fatigue Loss of appetite Nausea Skin rash This list may not describe all possible side effects. Call your doctor for medical advice about side effects. You may report side effects to FDA at 1-800-FDA-1088. Where should I keep my medication? This medication is given in a hospital or clinic. It will not be stored at home. NOTE: This sheet is a summary. It may not cover all possible information. If you have questions about this medicine, talk to your doctor, pharmacist, or health care provider.  2023 Elsevier/Gold Standard (2013-03-17 00:00:00)       To help prevent nausea and vomiting after your treatment, we encourage you to take your nausea medication as directed.  BELOW ARE SYMPTOMS THAT SHOULD BE REPORTED IMMEDIATELY: *FEVER GREATER THAN 100.4 F (38 C) OR HIGHER *CHILLS OR SWEATING *NAUSEA AND VOMITING THAT IS NOT CONTROLLED WITH YOUR NAUSEA MEDICATION *UNUSUAL SHORTNESS OF BREATH *UNUSUAL BRUISING OR BLEEDING *URINARY PROBLEMS (pain or burning when urinating, or frequent urination) *BOWEL PROBLEMS (unusual diarrhea, constipation, pain near the anus) TENDERNESS IN MOUTH AND THROAT WITH OR WITHOUT PRESENCE OF ULCERS (sore throat, sores in mouth, or a toothache) UNUSUAL RASH, SWELLING OR PAIN  UNUSUAL VAGINAL DISCHARGE OR ITCHING   Items with * indicate a potential emergency and should be followed up as soon as possible or go to the Emergency Department if any problems should occur.  Please  show the CHEMOTHERAPY ALERT CARD or IMMUNOTHERAPY ALERT CARD at check-in to the Emergency Department and triage nurse.  Should you have questions after your visit or need to cancel or reschedule your appointment, please contact [facility] [phone]  and follow the prompts.  Office hours are 8:00 a.m. to 4:30 p.m. Monday - Friday. Please note that  voicemails left after 4:00 p.m. may not be returned until the following business day.  We are closed weekends and major holidays. You have access to a nurse at all times for urgent questions. Please call the main number to the clinic [phone] and follow the prompts.  For any non-urgent questions, you may also contact your provider using MyChart. We now offer e-Visits for anyone 18 and older to request care online for non-urgent symptoms. For details visit [email or web address].   Also download the MyChart app! Go to the app store, search "MyChart", open the app, select [facility], and log in with your MyChart username and password.   

## 2022-07-06 NOTE — Patient Instructions (Addendum)
[facility] at [facility] Discharge Instructions   You were seen and examined today by Dr. Katragadda.  He reviewed the results of your lab work which are normal/stable.   We will proceed with your treatment today.  Return as scheduled.    Thank you for choosing [facility] at [facility] to provide your oncology and hematology care.  To afford each patient quality time with our provider, please arrive at least 15 minutes before your scheduled appointment time.   If you have a lab appointment with the Cancer Center please come in thru the Main Entrance and check in at the main information desk.  You need to re-schedule your appointment should you arrive 10 or more minutes late.  We strive to give you quality time with our providers, and arriving late affects you and other patients whose appointments are after yours.  Also, if you no show three or more times for appointments you may be dismissed from the clinic at the providers discretion.     Again, thank you for choosing [facility].  Our hope is that these requests will decrease the amount of time that you wait before being seen by our physicians.       _____________________________________________________________  Should you have questions after your visit to [facility], please contact our office at [phone] and follow the prompts.  Our office hours are 8:00 a.m. and 4:30 p.m. Monday - Friday.  Please note that voicemails left after 4:00 p.m. may not be returned until the following business day.  We are closed weekends and major holidays.  You do have access to a nurse 24-7, just call the main number to the clinic [phone] and do not press any options, hold on the line and a nurse will answer the phone.    For prescription refill requests, have your pharmacy contact our office and allow 72 hours.    Due to Covid, you will need to wear a mask upon entering  the hospital. If you do not have a mask, a mask will be given to you at the Main Entrance upon arrival. For doctor visits, patients may have 1 support person age 18 or older with them. For treatment visits, patients can not have anyone with them due to social distancing guidelines and our immunocompromised population.      

## 2022-07-07 ENCOUNTER — Other Ambulatory Visit: Payer: Self-pay

## 2022-07-27 ENCOUNTER — Inpatient Hospital Stay: Payer: Medicare Other | Attending: Hematology

## 2022-07-27 ENCOUNTER — Inpatient Hospital Stay: Payer: Medicare Other

## 2022-07-27 ENCOUNTER — Ambulatory Visit: Payer: Medicare Other

## 2022-07-27 ENCOUNTER — Other Ambulatory Visit: Payer: Medicare Other

## 2022-07-27 ENCOUNTER — Ambulatory Visit: Payer: Medicare Other | Admitting: Hematology

## 2022-07-27 VITALS — BP 141/75 | HR 63 | Temp 98.4°F | Resp 18

## 2022-07-27 DIAGNOSIS — C787 Secondary malignant neoplasm of liver and intrahepatic bile duct: Secondary | ICD-10-CM | POA: Diagnosis not present

## 2022-07-27 DIAGNOSIS — Z95828 Presence of other vascular implants and grafts: Secondary | ICD-10-CM

## 2022-07-27 DIAGNOSIS — Z79899 Other long term (current) drug therapy: Secondary | ICD-10-CM | POA: Insufficient documentation

## 2022-07-27 DIAGNOSIS — Z5112 Encounter for antineoplastic immunotherapy: Secondary | ICD-10-CM | POA: Diagnosis not present

## 2022-07-27 DIAGNOSIS — C184 Malignant neoplasm of transverse colon: Secondary | ICD-10-CM

## 2022-07-27 DIAGNOSIS — C189 Malignant neoplasm of colon, unspecified: Secondary | ICD-10-CM

## 2022-07-27 DIAGNOSIS — E064 Drug-induced thyroiditis: Secondary | ICD-10-CM

## 2022-07-27 LAB — CBC WITH DIFFERENTIAL/PLATELET
Abs Immature Granulocytes: 0.01 10*3/uL (ref 0.00–0.07)
Basophils Absolute: 0 10*3/uL (ref 0.0–0.1)
Basophils Relative: 1 %
Eosinophils Absolute: 0.1 10*3/uL (ref 0.0–0.5)
Eosinophils Relative: 2 %
HCT: 41.7 % (ref 36.0–46.0)
Hemoglobin: 13.5 g/dL (ref 12.0–15.0)
Immature Granulocytes: 0 %
Lymphocytes Relative: 22 %
Lymphs Abs: 1.4 10*3/uL (ref 0.7–4.0)
MCH: 30.3 pg (ref 26.0–34.0)
MCHC: 32.4 g/dL (ref 30.0–36.0)
MCV: 93.5 fL (ref 80.0–100.0)
Monocytes Absolute: 0.6 10*3/uL (ref 0.1–1.0)
Monocytes Relative: 9 %
Neutro Abs: 4.2 10*3/uL (ref 1.7–7.7)
Neutrophils Relative %: 66 %
Platelets: 189 10*3/uL (ref 150–400)
RBC: 4.46 MIL/uL (ref 3.87–5.11)
RDW: 13.2 % (ref 11.5–15.5)
WBC: 6.3 10*3/uL (ref 4.0–10.5)
nRBC: 0 % (ref 0.0–0.2)

## 2022-07-27 LAB — COMPREHENSIVE METABOLIC PANEL
ALT: 20 U/L (ref 0–44)
AST: 26 U/L (ref 15–41)
Albumin: 4.6 g/dL (ref 3.5–5.0)
Alkaline Phosphatase: 75 U/L (ref 38–126)
Anion gap: 9 (ref 5–15)
BUN: 13 mg/dL (ref 8–23)
CO2: 28 mmol/L (ref 22–32)
Calcium: 9.3 mg/dL (ref 8.9–10.3)
Chloride: 99 mmol/L (ref 98–111)
Creatinine, Ser: 0.64 mg/dL (ref 0.44–1.00)
GFR, Estimated: >60 mL/min (ref >60)
Glucose, Bld: 95 mg/dL (ref 70–99)
Potassium: 3.7 mmol/L (ref 3.5–5.1)
Sodium: 136 mmol/L (ref 135–145)
Total Bilirubin: 0.7 mg/dL (ref 0.3–1.2)
Total Protein: 8.3 g/dL — ABNORMAL HIGH (ref 6.5–8.1)

## 2022-07-27 LAB — MAGNESIUM: Magnesium: 2.3 mg/dL (ref 1.7–2.4)

## 2022-07-27 LAB — TSH: TSH: 2.006 u[IU]/mL (ref 0.350–4.500)

## 2022-07-27 MED ORDER — HEPARIN SOD (PORK) LOCK FLUSH 100 UNIT/ML IV SOLN
500.0000 [IU] | Freq: Once | INTRAVENOUS | Status: AC | PRN
  Administered 2022-07-27: 500 [IU]

## 2022-07-27 MED ORDER — SODIUM CHLORIDE 0.9 % IV SOLN
200.0000 mg | Freq: Once | INTRAVENOUS | Status: AC
  Administered 2022-07-27: 200 mg via INTRAVENOUS
  Filled 2022-07-27: qty 8

## 2022-07-27 MED ORDER — SODIUM CHLORIDE 0.9 % IV SOLN: Freq: Once | INTRAVENOUS | Status: AC

## 2022-07-27 MED ORDER — SODIUM CHLORIDE 0.9% FLUSH
10.0000 mL | INTRAVENOUS | Status: DC | PRN
  Administered 2022-07-27: 10 mL

## 2022-07-27 NOTE — Patient Instructions (Signed)
[facility]  Discharge Instructions: Thank you for choosing [facility] to provide your oncology and hematology care.  If you have a lab appointment with the [facility], please come in thru the Main Entrance and check in at the main information desk.  Wear comfortable clothing and clothing appropriate for easy access to any Portacath or PICC line.   We strive to give you quality time with your provider. You may need to reschedule your appointment if you arrive late (15 or more minutes).  Arriving late affects you and other patients whose appointments are after yours.  Also, if you miss three or more appointments without notifying the office, you may be dismissed from the clinic at the provider's discretion.      For prescription refill requests, have your pharmacy contact our office and allow 72 hours for refills to be completed.    Today you received the following chemotherapy and/or immunotherapy agents Keytruda.  Pembrolizumab Injection What is this medication? PEMBROLIZUMAB (PEM broe LIZ ue mab) treats some types of cancer. It works by helping your immune system slow or stop the spread of cancer cells. It is a monoclonal antibody. This medicine may be used for other purposes; ask your health care provider or pharmacist if you have questions. COMMON BRAND NAME(S): Keytruda What should I tell my care team before I take this medication? They need to know if you have any of these conditions: Allogeneic stem cell transplant (uses someone else's stem cells) Autoimmune diseases, such as Crohn disease, ulcerative colitis, lupus History of chest radiation Nervous system problems, such as Guillain-Barre syndrome, myasthenia gravis Organ transplant An unusual or allergic reaction to pembrolizumab, other medications, foods, dyes, or preservatives Pregnant or trying to get pregnant Breast-feeding How should I use this medication? This medication is injected  into a vein. It is given by your care team in a hospital or clinic setting. A special MedGuide will be given to you before each treatment. Be sure to read this information carefully each time. Talk to your care team about the use of this medication in children. While it may be prescribed for children as young as 6 months for selected conditions, precautions do apply. Overdosage: If you think you have taken too much of this medicine contact a poison control center or emergency room at once. NOTE: This medicine is only for you. Do not share this medicine with others. What if I miss a dose? Keep appointments for follow-up doses. It is important not to miss your dose. Call your care team if you are unable to keep an appointment. What may interact with this medication? Interactions have not been studied. This list may not describe all possible interactions. Give your health care provider a list of all the medicines, herbs, non-prescription drugs, or dietary supplements you use. Also tell them if you smoke, drink alcohol, or use illegal drugs. Some items may interact with your medicine. What should I watch for while using this medication? Your condition will be monitored carefully while you are receiving this medication. You may need blood work while taking this medication. This medication may cause serious skin reactions. They can happen weeks to months after starting the medication. Contact your care team right away if you notice fevers or flu-like symptoms with a rash. The rash may be red or purple and then turn into blisters or peeling of the skin. You may also notice a red rash with swelling of the face, lips, or lymph  nodes in your neck or under your arms. Tell your care team right away if you have any change in your eyesight. Talk to your care team if you may be pregnant. Serious birth defects can occur if you take this medication during pregnancy and for 4 months after the last dose. You will need a  negative pregnancy test before starting this medication. Contraception is recommended while taking this medication and for 4 months after the last dose. Your care team can help you find the option that works for you. Do not breastfeed while taking this medication and for 4 months after the last dose. What side effects may I notice from receiving this medication? Side effects that you should report to your care team as soon as possible: Allergic reactions--skin rash, itching, hives, swelling of the face, lips, tongue, or throat Dry cough, shortness of breath or trouble breathing Eye pain, redness, irritation, or discharge with blurry or decreased vision Heart muscle inflammation--unusual weakness or fatigue, shortness of breath, chest pain, fast or irregular heartbeat, dizziness, swelling of the ankles, feet, or hands Hormone gland problems--headache, sensitivity to light, unusual weakness or fatigue, dizziness, fast or irregular heartbeat, increased sensitivity to cold or heat, excessive sweating, constipation, hair loss, increased thirst or amount of urine, tremors or shaking, irritability Infusion reactions--chest pain, shortness of breath or trouble breathing, feeling faint or lightheaded Kidney injury (glomerulonephritis)--decrease in the amount of urine, red or dark Ausencio Vaden urine, foamy or bubbly urine, swelling of the ankles, hands, or feet Liver injury--right upper belly pain, loss of appetite, nausea, light-colored stool, dark yellow or Amaar Oshita urine, yellowing skin or eyes, unusual weakness or fatigue Pain, tingling, or numbness in the hands or feet, muscle weakness, change in vision, confusion or trouble speaking, loss of balance or coordination, trouble walking, seizures Rash, fever, and swollen lymph nodes Redness, blistering, peeling, or loosening of the skin, including inside the mouth Sudden or severe stomach pain, bloody diarrhea, fever, nausea, vomiting Side effects that usually do not  require medical attention (report to your care team if they continue or are bothersome): Bone, joint, or muscle pain Diarrhea Fatigue Loss of appetite Nausea Skin rash This list may not describe all possible side effects. Call your doctor for medical advice about side effects. You may report side effects to FDA at 1-800-FDA-1088. Where should I keep my medication? This medication is given in a hospital or clinic. It will not be stored at home. NOTE: This sheet is a summary. It may not cover all possible information. If you have questions about this medicine, talk to your doctor, pharmacist, or health care provider.  2023 Elsevier/Gold Standard (2013-03-17 00:00:00)        To help prevent nausea and vomiting after your treatment, we encourage you to take your nausea medication as directed.  BELOW ARE SYMPTOMS THAT SHOULD BE REPORTED IMMEDIATELY: *FEVER GREATER THAN 100.4 F (38 C) OR HIGHER *CHILLS OR SWEATING *NAUSEA AND VOMITING THAT IS NOT CONTROLLED WITH YOUR NAUSEA MEDICATION *UNUSUAL SHORTNESS OF BREATH *UNUSUAL BRUISING OR BLEEDING *URINARY PROBLEMS (pain or burning when urinating, or frequent urination) *BOWEL PROBLEMS (unusual diarrhea, constipation, pain near the anus) TENDERNESS IN MOUTH AND THROAT WITH OR WITHOUT PRESENCE OF ULCERS (sore throat, sores in mouth, or a toothache) UNUSUAL RASH, SWELLING OR PAIN  UNUSUAL VAGINAL DISCHARGE OR ITCHING   Items with * indicate a potential emergency and should be followed up as soon as possible or go to the Emergency Department if any problems should occur.  Please show the CHEMOTHERAPY ALERT CARD or IMMUNOTHERAPY ALERT CARD at check-in to the Emergency Department and triage nurse.  Should you have questions after your visit or need to cancel or reschedule your appointment, please contact [facility] [phone]  and follow the prompts.  Office hours are 8:00 a.m. to 4:30 p.m. Monday - Friday. Please note  that voicemails left after 4:00 p.m. may not be returned until the following business day.  We are closed weekends and major holidays. You have access to a nurse at all times for urgent questions. Please call the main number to the clinic [phone] and follow the prompts.  For any non-urgent questions, you may also contact your provider using MyChart. We now offer e-Visits for anyone 8 and older to request care online for non-urgent symptoms. For details visit [email or web address].   Also download the MyChart app! Go to the app store, search "MyChart", open the app, select [facility], and log in with your MyChart username and password.

## 2022-07-27 NOTE — Progress Notes (Signed)
Patient presents today for Keytruda infusion.  Patient is in satisfactory condition with no new complaints voiced.  Vital signs are stable.   Labs reviewed and all labs are within treatment parameters.  We will proceed with treatment per MD orders.   Patient tolerated treatment well with no complaints voiced.  Patient left ambulatory in stable condition.  Vital signs stable at discharge.  Follow up as scheduled.    

## 2022-08-16 ENCOUNTER — Other Ambulatory Visit: Payer: Self-pay

## 2022-08-16 DIAGNOSIS — C787 Secondary malignant neoplasm of liver and intrahepatic bile duct: Secondary | ICD-10-CM

## 2022-08-16 DIAGNOSIS — E064 Drug-induced thyroiditis: Secondary | ICD-10-CM

## 2022-08-17 ENCOUNTER — Inpatient Hospital Stay: Payer: Medicare Other | Attending: Hematology

## 2022-08-17 ENCOUNTER — Inpatient Hospital Stay: Payer: Medicare Other

## 2022-08-17 VITALS — BP 129/62 | HR 62 | Temp 97.4°F | Resp 16 | Wt 156.6 lb

## 2022-08-17 DIAGNOSIS — C787 Secondary malignant neoplasm of liver and intrahepatic bile duct: Secondary | ICD-10-CM | POA: Insufficient documentation

## 2022-08-17 DIAGNOSIS — Z5112 Encounter for antineoplastic immunotherapy: Secondary | ICD-10-CM | POA: Insufficient documentation

## 2022-08-17 DIAGNOSIS — C778 Secondary and unspecified malignant neoplasm of lymph nodes of multiple regions: Secondary | ICD-10-CM | POA: Insufficient documentation

## 2022-08-17 DIAGNOSIS — C189 Malignant neoplasm of colon, unspecified: Secondary | ICD-10-CM

## 2022-08-17 DIAGNOSIS — Z79899 Other long term (current) drug therapy: Secondary | ICD-10-CM | POA: Diagnosis not present

## 2022-08-17 DIAGNOSIS — C7800 Secondary malignant neoplasm of unspecified lung: Secondary | ICD-10-CM | POA: Diagnosis not present

## 2022-08-17 DIAGNOSIS — E064 Drug-induced thyroiditis: Secondary | ICD-10-CM

## 2022-08-17 DIAGNOSIS — C184 Malignant neoplasm of transverse colon: Secondary | ICD-10-CM | POA: Insufficient documentation

## 2022-08-17 DIAGNOSIS — Z95828 Presence of other vascular implants and grafts: Secondary | ICD-10-CM

## 2022-08-17 LAB — CBC WITH DIFFERENTIAL/PLATELET
Abs Immature Granulocytes: 0.02 10*3/uL (ref 0.00–0.07)
Basophils Absolute: 0 10*3/uL (ref 0.0–0.1)
Basophils Relative: 1 %
Eosinophils Absolute: 0.1 10*3/uL (ref 0.0–0.5)
Eosinophils Relative: 2 %
HCT: 41.7 % (ref 36.0–46.0)
Hemoglobin: 13.4 g/dL (ref 12.0–15.0)
Immature Granulocytes: 0 %
Lymphocytes Relative: 23 %
Lymphs Abs: 1.4 10*3/uL (ref 0.7–4.0)
MCH: 30.1 pg (ref 26.0–34.0)
MCHC: 32.1 g/dL (ref 30.0–36.0)
MCV: 93.7 fL (ref 80.0–100.0)
Monocytes Absolute: 0.6 10*3/uL (ref 0.1–1.0)
Monocytes Relative: 10 %
Neutro Abs: 3.9 10*3/uL (ref 1.7–7.7)
Neutrophils Relative %: 64 %
Platelets: 185 10*3/uL (ref 150–400)
RBC: 4.45 MIL/uL (ref 3.87–5.11)
RDW: 12.9 % (ref 11.5–15.5)
WBC: 6.1 10*3/uL (ref 4.0–10.5)
nRBC: 0 % (ref 0.0–0.2)

## 2022-08-17 LAB — MAGNESIUM: Magnesium: 2.3 mg/dL (ref 1.7–2.4)

## 2022-08-17 LAB — COMPREHENSIVE METABOLIC PANEL
ALT: 20 U/L (ref 0–44)
AST: 30 U/L (ref 15–41)
Albumin: 4.4 g/dL (ref 3.5–5.0)
Alkaline Phosphatase: 72 U/L (ref 38–126)
Anion gap: 10 (ref 5–15)
BUN: 15 mg/dL (ref 8–23)
CO2: 26 mmol/L (ref 22–32)
Calcium: 9.3 mg/dL (ref 8.9–10.3)
Chloride: 101 mmol/L (ref 98–111)
Creatinine, Ser: 0.74 mg/dL (ref 0.44–1.00)
GFR, Estimated: >60 mL/min (ref >60)
Glucose, Bld: 95 mg/dL (ref 70–99)
Potassium: 4.1 mmol/L (ref 3.5–5.1)
Sodium: 137 mmol/L (ref 135–145)
Total Bilirubin: 0.6 mg/dL (ref 0.3–1.2)
Total Protein: 8.2 g/dL — ABNORMAL HIGH (ref 6.5–8.1)

## 2022-08-17 LAB — TSH: TSH: 2.09 u[IU]/mL (ref 0.350–4.500)

## 2022-08-17 MED ORDER — SODIUM CHLORIDE 0.9 % IV SOLN
200.0000 mg | Freq: Once | INTRAVENOUS | Status: AC
  Administered 2022-08-17: 200 mg via INTRAVENOUS
  Filled 2022-08-17: qty 8

## 2022-08-17 MED ORDER — HEPARIN SOD (PORK) LOCK FLUSH 100 UNIT/ML IV SOLN
500.0000 [IU] | Freq: Once | INTRAVENOUS | Status: AC | PRN
  Administered 2022-08-17: 500 [IU]

## 2022-08-17 MED ORDER — SODIUM CHLORIDE 0.9 % IV SOLN: Freq: Once | INTRAVENOUS | Status: AC

## 2022-08-17 MED ORDER — SODIUM CHLORIDE 0.9% FLUSH
10.0000 mL | INTRAVENOUS | Status: DC | PRN
  Administered 2022-08-17: 10 mL

## 2022-08-17 NOTE — Progress Notes (Signed)
Patient here for treatment today, labs meet parameters today, will proceed as follows.    Treatment given per orders. Patient tolerated it well without problems. Vitals stable and discharged home from clinic ambulatory. Follow up as scheduled.

## 2022-08-17 NOTE — Patient Instructions (Signed)
[facility]  Discharge Instructions: Thank you for choosing [facility] to provide your oncology and hematology care.  If you have a lab appointment with the Cancer Center, please come in thru the Main Entrance and check in at the main information desk.  Wear comfortable clothing and clothing appropriate for easy access to any Portacath or PICC line.   We strive to give you quality time with your provider. You may need to reschedule your appointment if you arrive late (15 or more minutes).  Arriving late affects you and other patients whose appointments are after yours.  Also, if you miss three or more appointments without notifying the office, you may be dismissed from the clinic at the provider's discretion.      For prescription refill requests, have your pharmacy contact our office and allow 72 hours for refills to be completed.    Today you received the following chemotherapy and/or immunotherapy agents keytruda   To help prevent nausea and vomiting after your treatment, we encourage you to take your nausea medication as directed.  BELOW ARE SYMPTOMS THAT SHOULD BE REPORTED IMMEDIATELY: *FEVER GREATER THAN 100.4 F (38 C) OR HIGHER *CHILLS OR SWEATING *NAUSEA AND VOMITING THAT IS NOT CONTROLLED WITH YOUR NAUSEA MEDICATION *UNUSUAL SHORTNESS OF BREATH *UNUSUAL BRUISING OR BLEEDING *URINARY PROBLEMS (pain or burning when urinating, or frequent urination) *BOWEL PROBLEMS (unusual diarrhea, constipation, pain near the anus) TENDERNESS IN MOUTH AND THROAT WITH OR WITHOUT PRESENCE OF ULCERS (sore throat, sores in mouth, or a toothache) UNUSUAL RASH, SWELLING OR PAIN  UNUSUAL VAGINAL DISCHARGE OR ITCHING   Items with * indicate a potential emergency and should be followed up as soon as possible or go to the Emergency Department if any problems should occur.  Please show the CHEMOTHERAPY ALERT CARD or IMMUNOTHERAPY ALERT CARD at check-in to the Emergency  Department and triage nurse.  Should you have questions after your visit or need to cancel or reschedule your appointment, please contact [facility] [phone]  and follow the prompts.  Office hours are 8:00 a.m. to 4:30 p.m. Monday - Friday. Please note that voicemails left after 4:00 p.m. may not be returned until the following business day.  We are closed weekends and major holidays. You have access to a nurse at all times for urgent questions. Please call the main number to the clinic [phone] and follow the prompts.  For any non-urgent questions, you may also contact your provider using MyChart. We now offer e-Visits for anyone 18 and older to request care online for non-urgent symptoms. For details visit [email or web address].   Also download the MyChart app! Go to the app store, search "MyChart", open the app, select [facility], and log in with your MyChart username and password.   

## 2022-08-19 LAB — CEA: CEA: 5.6 ng/mL — ABNORMAL HIGH (ref 0.0–4.7)

## 2022-09-06 ENCOUNTER — Other Ambulatory Visit: Payer: Self-pay

## 2022-09-06 DIAGNOSIS — E064 Drug-induced thyroiditis: Secondary | ICD-10-CM

## 2022-09-06 DIAGNOSIS — K529 Noninfective gastroenteritis and colitis, unspecified: Secondary | ICD-10-CM

## 2022-09-06 DIAGNOSIS — C189 Malignant neoplasm of colon, unspecified: Secondary | ICD-10-CM

## 2022-09-06 DIAGNOSIS — C184 Malignant neoplasm of transverse colon: Secondary | ICD-10-CM

## 2022-09-07 ENCOUNTER — Inpatient Hospital Stay: Payer: Medicare Other

## 2022-09-07 ENCOUNTER — Encounter: Payer: Self-pay | Admitting: Hematology

## 2022-09-07 ENCOUNTER — Inpatient Hospital Stay (HOSPITAL_BASED_OUTPATIENT_CLINIC_OR_DEPARTMENT_OTHER): Payer: Medicare Other | Admitting: Hematology

## 2022-09-07 VITALS — BP 153/70 | HR 65 | Temp 97.8°F | Resp 16 | Wt 159.3 lb

## 2022-09-07 VITALS — BP 124/59 | HR 56 | Temp 97.7°F | Resp 18

## 2022-09-07 DIAGNOSIS — C189 Malignant neoplasm of colon, unspecified: Secondary | ICD-10-CM | POA: Diagnosis not present

## 2022-09-07 DIAGNOSIS — Z95828 Presence of other vascular implants and grafts: Secondary | ICD-10-CM

## 2022-09-07 DIAGNOSIS — C787 Secondary malignant neoplasm of liver and intrahepatic bile duct: Secondary | ICD-10-CM

## 2022-09-07 DIAGNOSIS — C778 Secondary and unspecified malignant neoplasm of lymph nodes of multiple regions: Secondary | ICD-10-CM | POA: Diagnosis not present

## 2022-09-07 DIAGNOSIS — Z79899 Other long term (current) drug therapy: Secondary | ICD-10-CM | POA: Diagnosis not present

## 2022-09-07 DIAGNOSIS — C184 Malignant neoplasm of transverse colon: Secondary | ICD-10-CM | POA: Diagnosis not present

## 2022-09-07 DIAGNOSIS — Z5112 Encounter for antineoplastic immunotherapy: Secondary | ICD-10-CM | POA: Diagnosis not present

## 2022-09-07 DIAGNOSIS — E064 Drug-induced thyroiditis: Secondary | ICD-10-CM

## 2022-09-07 DIAGNOSIS — C7800 Secondary malignant neoplasm of unspecified lung: Secondary | ICD-10-CM | POA: Diagnosis not present

## 2022-09-07 LAB — CBC WITH DIFFERENTIAL/PLATELET
Abs Immature Granulocytes: 0.02 10*3/uL (ref 0.00–0.07)
Basophils Absolute: 0 10*3/uL (ref 0.0–0.1)
Basophils Relative: 1 %
Eosinophils Absolute: 0.1 10*3/uL (ref 0.0–0.5)
Eosinophils Relative: 2 %
HCT: 40.9 % (ref 36.0–46.0)
Hemoglobin: 13.3 g/dL (ref 12.0–15.0)
Immature Granulocytes: 0 %
Lymphocytes Relative: 22 %
Lymphs Abs: 1.2 10*3/uL (ref 0.7–4.0)
MCH: 30.2 pg (ref 26.0–34.0)
MCHC: 32.5 g/dL (ref 30.0–36.0)
MCV: 92.7 fL (ref 80.0–100.0)
Monocytes Absolute: 0.6 10*3/uL (ref 0.1–1.0)
Monocytes Relative: 11 %
Neutro Abs: 3.5 10*3/uL (ref 1.7–7.7)
Neutrophils Relative %: 64 %
Platelets: 189 10*3/uL (ref 150–400)
RBC: 4.41 MIL/uL (ref 3.87–5.11)
RDW: 13.2 % (ref 11.5–15.5)
WBC: 5.5 10*3/uL (ref 4.0–10.5)
nRBC: 0 % (ref 0.0–0.2)

## 2022-09-07 LAB — MAGNESIUM: Magnesium: 2.1 mg/dL (ref 1.7–2.4)

## 2022-09-07 LAB — COMPREHENSIVE METABOLIC PANEL
ALT: 21 U/L (ref 0–44)
AST: 26 U/L (ref 15–41)
Albumin: 4.1 g/dL (ref 3.5–5.0)
Alkaline Phosphatase: 69 U/L (ref 38–126)
Anion gap: 7 (ref 5–15)
BUN: 14 mg/dL (ref 8–23)
CO2: 25 mmol/L (ref 22–32)
Calcium: 9 mg/dL (ref 8.9–10.3)
Chloride: 102 mmol/L (ref 98–111)
Creatinine, Ser: 0.65 mg/dL (ref 0.44–1.00)
GFR, Estimated: >60 mL/min (ref >60)
Glucose, Bld: 92 mg/dL (ref 70–99)
Potassium: 4.2 mmol/L (ref 3.5–5.1)
Sodium: 134 mmol/L — ABNORMAL LOW (ref 135–145)
Total Bilirubin: 0.3 mg/dL (ref 0.3–1.2)
Total Protein: 7.9 g/dL (ref 6.5–8.1)

## 2022-09-07 LAB — TSH: TSH: 2.362 u[IU]/mL (ref 0.350–4.500)

## 2022-09-07 MED ORDER — SODIUM CHLORIDE 0.9% FLUSH
10.0000 mL | INTRAVENOUS | Status: DC | PRN
  Administered 2022-09-07: 10 mL

## 2022-09-07 MED ORDER — SODIUM CHLORIDE 0.9 % IV SOLN
200.0000 mg | Freq: Once | INTRAVENOUS | Status: AC
  Administered 2022-09-07: 200 mg via INTRAVENOUS
  Filled 2022-09-07: qty 8

## 2022-09-07 MED ORDER — HEPARIN SOD (PORK) LOCK FLUSH 100 UNIT/ML IV SOLN
500.0000 [IU] | Freq: Once | INTRAVENOUS | Status: AC | PRN
  Administered 2022-09-07: 500 [IU]

## 2022-09-07 MED ORDER — SODIUM CHLORIDE 0.9 % IV SOLN: Freq: Once | INTRAVENOUS | Status: AC

## 2022-09-07 NOTE — Progress Notes (Signed)
Patient has been examined by Dr. Delton Coombes. Vital signs and labs have been reviewed by MD - ANC, Creatinine, LFTs, hemoglobin, and platelets are within treatment parameters per M.D. - pt may proceed with treatment.  Primary RN and pharmacy notified.

## 2022-09-07 NOTE — Progress Notes (Signed)
Patient presents today for Keytruda infusion.  Patient is in satisfactory condition with no new complaints voiced.  Vital signs are stable.  Labs reviewed by Dr. Delton Coombes during her office visit.  All labs are within treatment parameters.  We will proceed with treatment per MD orders.  Patient tolerated treatment well with no complaints voiced.  Patient left ambulatory with husband in stable condition.  Vital signs stable at discharge.  Follow up as scheduled.

## 2022-09-07 NOTE — Patient Instructions (Signed)
[facility] at [facility] Discharge Instructions   You were seen and examined today by Dr. Katragadda.  He reviewed the results of your lab work which are normal/stable.   We will proceed with your treatment today.  Return as scheduled.    Thank you for choosing [facility] at [facility] to provide your oncology and hematology care.  To afford each patient quality time with our provider, please arrive at least 15 minutes before your scheduled appointment time.   If you have a lab appointment with the Cancer Center please come in thru the Main Entrance and check in at the main information desk.  You need to re-schedule your appointment should you arrive 10 or more minutes late.  We strive to give you quality time with our providers, and arriving late affects you and other patients whose appointments are after yours.  Also, if you no show three or more times for appointments you may be dismissed from the clinic at the providers discretion.     Again, thank you for choosing [facility].  Our hope is that these requests will decrease the amount of time that you wait before being seen by our physicians.       _____________________________________________________________  Should you have questions after your visit to [facility], please contact our office at [phone] and follow the prompts.  Our office hours are 8:00 a.m. and 4:30 p.m. Monday - Friday.  Please note that voicemails left after 4:00 p.m. may not be returned until the following business day.  We are closed weekends and major holidays.  You do have access to a nurse 24-7, just call the main number to the clinic [phone] and do not press any options, hold on the line and a nurse will answer the phone.    For prescription refill requests, have your pharmacy contact our office and allow 72 hours.    Due to Covid, you will need to wear a mask upon entering  the hospital. If you do not have a mask, a mask will be given to you at the Main Entrance upon arrival. For doctor visits, patients may have 1 support person age 18 or older with them. For treatment visits, patients can not have anyone with them due to social distancing guidelines and our immunocompromised population.      

## 2022-09-07 NOTE — Progress Notes (Signed)
[facility] [address] [place] [address]    Clinic Day:  09/07/2022  Referring physician: Redmond School, MD  Patient Care Team: Redmond School, MD as PCP - General (Internal Medicine) Amanda Signs, MD as Consulting Physician (General Surgery) Derek Jack, MD as Medical Oncologist (Medical Oncology)   ASSESSMENT & PLAN:   Assessment: 1.  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 - Status post 3 cycles of FOLFOX from 02/10/2014 through 03/10/2014, discontinued secondary to poor tolerance.  DPD was negative. -Last colonoscopy on 03/28/2018 patent ileo-colonic anastomosis.  8 mm polyp in the transverse colon.  Diverticulosis in the sigmoid colon, descending colon. - CT scan on 11/04/2018 shows postoperative findings of right colectomy with redemonstrated mesenteric nodule/lymph node measuring 1.9 x 1.7 cm, unchanged from prior scan in October 2019.  No evidence of metastatic disease.  Stable solid and groundglass pulmonary nodules, stable over multiple prior exams. -CEA was 4.6 on 10/28/2018. -CTAP on 09/21/2020 showed numerous small pulmonary nodules throughout the lung bases, 5 mm.  Enlarged retrocrural periaortic lymph node in the lower chest.  Soft tissue nodule within the central mesentery substantially increased in size, encasing proximal superior mesenteric artery and measuring 5 x 4.3 cm.  Numerous retroperitoneal lymph nodes, largest aortocaval lymph node measuring 2.8 x 2.2 cm. -CT chest on 09/27/2020 shows a left thoracic inlet lymph node approximately 2.3 cm.  Mediastinal lymph nodes and multiple subcentimeter pulmonary nodules suggestive of metastatic disease. -Mesenteric lymph node biopsy on 09/28/2020 shows mucin and fibrosis. -Left supraclavicular lymph node biopsy on 10/14/2020-soft tissue with abundant  mucin, rare fragments of atypical columnar epithelium. - 3 cycles of FOLFIRI and bevacizumab dose reduced from 11/10/2020 through 12/08/2020. - Caris test-BRAF V600 E+, MMR deficient, MSI high, TMB high, HER2 negative, BRCA1/2 pathogenic variant on exon 14 and exon 9 respectively.  The report also suggested decreased benefit to FOLFOX and bevacizumab in the first-line metastatic setting. - Cycle 1 of Keytruda on 01/13/2021.   Plan: 1.  Metastatic colon cancer to the liver, lungs and lymph nodes, BRAF V600E+: - CT CAP on 05/18/2022: Stable lung nodules, left supraclavicular, mediastinal and retroperitoneal adenopathy.  Stable lymph node conglomerate in the central small bowel mesentery.  No new metastatic disease. - She does not report any immunotherapy related side effects. - Last CEA was 5.6 on 08/17/2022.  LFTs today are normal.  CBC was grossly normal.  TSH is 2.3. - Proceed with treatment today and every 3 weeks.  RTC 9 weeks for follow-up.  Will plan to repeat CT CAP and CEA level prior to next visit.   2.  Diarrhea: - Baseline diarrhea stable.  Continue Imodium as needed.  Orders Placed This Encounter  Procedures   Magnesium    Standing Status:   Future    Standing Expiration Date:   09/28/2023   CBC with Differential    Standing Status:   Future    Standing Expiration Date:   09/28/2023   Comprehensive metabolic panel    Standing Status:   Future    Standing Expiration Date:   09/28/2023   T4    Standing Status:   Future    Standing Expiration Date:   09/28/2023   TSH    Standing Status:   Future    Standing Expiration Date:   09/28/2023   Magnesium    Standing Status:  Future    Standing Expiration Date:   10/19/2023   CBC with Differential    Standing Status:   Future    Standing Expiration Date:   10/19/2023   Comprehensive metabolic panel    Standing Status:   Future    Standing Expiration Date:   10/19/2023   Magnesium    Standing Status:   Future    Standing Expiration Date:    11/09/2023   CBC with Differential    Standing Status:   Future    Standing Expiration Date:   11/09/2023   Comprehensive metabolic panel    Standing Status:   Future    Standing Expiration Date:   11/09/2023   Magnesium    Standing Status:   Future    Standing Expiration Date:   11/30/2023   CBC with Differential    Standing Status:   Future    Standing Expiration Date:   11/30/2023   Comprehensive metabolic panel    Standing Status:   Future    Standing Expiration Date:   11/30/2023   T4    Standing Status:   Future    Standing Expiration Date:   11/30/2023   TSH    Standing Status:   Future    Standing Expiration Date:   11/30/2023   Magnesium    Standing Status:   Future    Standing Expiration Date:   12/21/2023   CBC with Differential    Standing Status:   Future    Standing Expiration Date:   12/21/2023   Comprehensive metabolic panel    Standing Status:   Future    Standing Expiration Date:   12/21/2023   Magnesium    Standing Status:   Future    Standing Expiration Date:   01/11/2024   CBC with Differential    Standing Status:   Future    Standing Expiration Date:   01/11/2024   Comprehensive metabolic panel    Standing Status:   Future    Standing Expiration Date:   01/11/2024      Amanda Norris,acting as a scribe for Derek Jack, MD.,have documented all relevant documentation on the behalf of Derek Jack, MD,as directed by  Derek Jack, MD while in the presence of Derek Jack, MD.   I, Derek Jack MD, have reviewed the above documentation for accuracy and completeness, and I agree with the above.   Derek Jack, MD   2/29/20246:41 PM  CHIEF COMPLAINT:   Diagnosis: transverse colon adenocarcinoma    Cancer Staging  No matching staging information was found for the patient.   Prior Therapy: 1. Right hemicolectomy on 12/29/2013. 2. FOLFOX x 3 cycles from 02/10/2014 to 03/10/2014, stopped secondary to poor  tolerance  Current Therapy:  Keytruda every 3 weeks    HISTORY OF PRESENT ILLNESS:   Oncology History  Adenocarcinoma of transverse colon ([name]) (Resolved)  12/09/2013 Initial Diagnosis   1. Colon, biopsy, proximal transverse mass INVASIVE ADENOCARCINOMA.   12/29/2013 Definitive Surgery   Colon, segmental resection for tumor, right - INVASIVE COLORECTAL ADENOCARCINOMA, 7 CM, EXTENDING INTO PERICOLONIC CONNECTIVE TISSUE. - METASTATIC CARCINOMA IN 6 OF 33 LYMPH NODES (6/33).   02/10/2014 - 03/10/2014 Adjuvant Chemotherapy   FOLFOX x 3 cycles   03/11/2014 Adverse Reaction   Patient called reporting diarrhea despite dose reduction and she wants to stop therapy.  Patient seen on 03/24/14 to discuss other treatment options and she stands by her decision to stop all therapy.   04/01/2014 Surgery  Port-a-cath removal by Dr. Arnoldo Morale.   01/12/2015 PET scan   Mild abdominal mesenteric LAD show hypermetabolic activity, 7 mm indeterminate pulm nodule in sup segment of RLL shows no assoc metabolic activity   99991111 PET scan   Mild progression of nodal mets in anterior mid abdominal mesentery, new nodal mets at L thoracic inlet. stable 6 mm nodule in posterior RLL, unchanged since 2015   06/22/2015 PET scan   Resolution of metabolic activity and decreased size of mild LAD at L thoracic inlet, stable mild mid abd hypermetabolic mesenteric LAD, stable 6 mm posterior LLL pulm nodule without metabolic activity   [id number] Imaging   MRI lumbar spine L4-L5 disc degenerated, broad based disc hernation, B facet arthropathy with hypertophy and edema, stenosis of lateral recesses that could cause neural compression   10/18/2016 Imaging   CT CAP- 1. Several small ground-glass pulmonary nodules are present in the lungs and are stable over the past 9 months, but several are mildly larger than they were in 2015. Low-grade adenocarcinoma can sometimes have this appearance and surveillance is likely warranted. 2.  There is a cluster of nodal tissue in the central mesentery, maximum short axis diameter 1.3 cm. This nodal cluster is relatively low in density and not appreciably changed from the prior exam. Given the lack of change is may represent quiescent residua of malignancy, but again, surveillance is likely warranted. 3. The left-sided rib fractures are more numerous than was revealed on the prior radiographs ; there are nondisplaced healing fractures of the left fifth, sixth, seventh, eighth, ninth, and tenth ribs. 4. Mild cardiomegaly although part of this appearance may be due to pectus excavatum. 5. Several additional small pulmonary nodules are stable from 2015 and considered benign. 6. Right hemicolectomy and sigmoid colon diverticulosis. 7. Small right paraumbilical hernia containing adipose tissue. 8. Lower lumbar spondylosis and degenerative disc disease potentially with mild impingement at L4-5.   04/25/2017 Imaging   CT C/A/P: Scattered solid pulmonary nodules measure up to 6 mm in the right lower lobe, unchanged. There are a few scattered ground-glass nodules measuring up to 8 mm in the right upper lobe, also stable. Distal perigastric lymph nodes are stable. No additional evidence of metastatic disease.   Metastatic colon cancer to liver ([place])  11/04/2020 Initial Diagnosis   Metastatic colon cancer to liver ([place])   11/10/2020 - 12/10/2020 Chemotherapy         01/13/2021 - 03/02/2022 Chemotherapy   Patient is on Treatment Plan : COLORECTAL Pembrolizumab q21d     01/13/2021 -  Chemotherapy   Patient is on Treatment Plan : COLORECTAL Pembrolizumab (200) q21d        INTERVAL HISTORY:   Amanda Norris is a 77 y.o. female presenting to clinic today for follow up of transverse colon adenocarcinoma . She was last seen by me on 07/06/2022.  Today, she states that she is doing well overall. Her appetite level is at 100%. Her energy level is at 0%. She denies any new problems. However, she has been  extremely fatigue. She denies walking and exercising lately.    PAST MEDICAL HISTORY:   Past Medical History: Past Medical History:  Diagnosis Date   Arthritis    wrist and thumbs   Colon cancer (HCC)    PONV (postoperative nausea and vomiting)    Port-A-Cath in place 11/09/2020   Ruptured lumbar disc    L1-L5 per patient report   Sciatica of left side     Surgical History: Past Surgical  History:  Procedure Laterality Date   COLONOSCOPY N/A 12/12/2013   Procedure: COLONOSCOPY;  Surgeon: Rogene Houston, MD;  [facility];  Service: Endoscopy;  Laterality: N/A;  730   COLONOSCOPY N/A 01/20/2015   Procedure: COLONOSCOPY;  Surgeon: Rogene Houston, MD;  Location: [facility];  Service: Endoscopy;  Laterality: N/A;  1030   COLONOSCOPY N/A 03/28/2018   Procedure: COLONOSCOPY;  Surgeon: Rogene Houston, MD;  Location: [facility];  Service: Endoscopy;  Laterality: N/A;  1015   EUS N/A 12/18/2013   Procedure: UPPER ENDOSCOPIC ULTRASOUND (EUS) LINEAR;  Surgeon: Milus Banister, MD;  Location: [facility];  Service: Endoscopy;  Laterality: N/A;   herniated disc     [id number]   IR IMAGING GUIDED PORT INSERTION  10/14/2020   IR US GUIDANCE  10/14/2020   PARTIAL COLECTOMY N/A 12/29/2013   Procedure: RIGHT HEMICOLECTOMY;  Surgeon: Jamesetta So, MD;  Location: [facility];  Service: General;  Laterality: N/A;   POLYPECTOMY  03/28/2018   Procedure: POLYPECTOMY;  Surgeon: Rogene Houston, MD;  Location: [facility];  Service: Endoscopy;;  transverse colon (hot snare);   PORT-A-CATH REMOVAL Right 04/01/2014   Procedure: MINOR REMOVAL PORT-A-CATH;  Surgeon: Jamesetta So, MD;  Location: [facility];  Service: General;  Laterality: Right;   PORTACATH PLACEMENT Right 02/04/2014   Procedure: INSERTION PORT-A-CATH;  Surgeon: Jamesetta So, MD;  Location: [facility];  Service: General;  Laterality: Right;    Social History: Social History   Socioeconomic History   Marital status: Married     Spouse name: Not on file   Number of children: Not on file   Years of education: Not on file   Highest education level: Not on file  Occupational History   Not on file  Tobacco Use   Smoking status: Former    Packs/day: 0.25    Years: 5.00    Total pack years: 1.25    Types: Cigarettes    Quit date: 04/19/1959    Years since quitting: 63.4   Smokeless tobacco: Never   Tobacco comments:    smoked for 5 years as teenager  Vaping Use   Vaping Use: Never used  Substance and Sexual Activity   Alcohol use: No   Drug use: No   Sexual activity: Yes    Birth control/protection: Post-menopausal  Other Topics Concern   Not on file  Social History Narrative   Not on file   Social Determinants of Health   Financial Resource Strain: Low Risk  (10/04/2020)   Overall Financial Resource Strain (CARDIA)    Difficulty of Paying Living Expenses: Not hard at all  Food Insecurity: No Food Insecurity (10/04/2020)   Hunger Vital Sign    Worried About Running Out of Food in the Last Year: Never true    Ran Out of Food in the Last Year: Never true  Transportation Needs: No Transportation Needs (10/04/2020)   PRAPARE - Hydrologist (Medical): No    Lack of Transportation (Non-Medical): No  Physical Activity: Inactive (10/04/2020)   Exercise Vital Sign    Days of Exercise per Week: 0 days    Minutes of Exercise per Session: 0 min  Stress: Stress Concern Present (10/04/2020)   [place]    Feeling of Stress : To some extent  Social Connections: Socially Integrated (10/04/2020)   Social Connection and Isolation Panel [NHANES]  Frequency of Communication with Friends and Family: More than three times a week    Frequency of Social Gatherings with Friends and Family: More than three times a week    Attends Religious Services: More than 4 times per year    Active Member of [employer] or Organizations: Yes     Attends Music therapist: More than 4 times per year    Marital Status: Married  Human resources officer Violence: Not At Risk (10/04/2020)   Humiliation, Afraid, Rape, and Kick questionnaire    Fear of Current or Ex-Partner: No    Emotionally Abused: No    Physically Abused: No    Sexually Abused: No    Family History: Family History  Problem Relation Age of Onset   Diabetes type II Mother    Dementia Father     Current Medications:  Current Outpatient Medications:    acetaminophen (TYLENOL) 500 MG tablet, Take 500 mg by mouth every 6 (six) hours as needed for moderate pain or headache., Disp: , Rfl:    aspirin 81 MG EC tablet, Take 1 tablet (81 mg total) by mouth daily. Swallow whole., Disp: 30 tablet, Rfl: 12   Bioflavonoid Products (ESTER C PO), Take 1,000 mg by mouth daily., Disp: , Rfl:    Cholecalciferol (D3-1000) 25 MCG (1000 UT) capsule, Take 125 Units by mouth daily., Disp: , Rfl:    Cranberry-Vitamin C-Vitamin E 4200-20-3 MG-MG-UNIT CAPS, Take 500 mg by mouth daily., Disp: , Rfl:    Garlic 123XX123 MG CAPS, Take 1,000 mg by mouth daily. , Disp: , Rfl:    lidocaine-prilocaine (EMLA) cream, , Disp: , Rfl:    Multiple Vitamin (MULTIVITAMIN) tablet, Take 1 tablet by mouth daily., Disp: , Rfl:    Omega-3 Fatty Acids (FISH OIL) 1200 MG CAPS, Take 1,000 mg by mouth daily., Disp: , Rfl:    OVER THE COUNTER MEDICATION, Take 1,000 mg by mouth daily. [place] 1000 mg daily, Disp: , Rfl:    OVER THE COUNTER MEDICATION, Take 1,000 mg by mouth daily. L-Lysine '1000mg'$  po daily, Disp: , Rfl:    pramoxine-hydrocortisone (PROCTOCREAM-HC) 1-1 % rectal cream, Place 1 application rectally 2 (two) times daily., Disp: 30 g, Rfl: 0   zinc gluconate 50 MG tablet, Take 50 mg by mouth daily., Disp: , Rfl:  No current facility-administered medications for this visit.  Facility-Administered Medications Ordered in Other Visits:    sodium chloride flush (NS) 0.9 % injection 10 mL, 10 mL,  Intracatheter, PRN, Derek Jack, MD, 10 mL at 09/07/22 1524   Allergies: Allergies  Allergen Reactions   Sulfa Antibiotics Other (See Comments)    "Sores in my mouth"    REVIEW OF SYSTEMS:   Review of Systems  Constitutional:  Negative for chills, fatigue and fever.  HENT:   Negative for lump/mass, mouth sores, nosebleeds, sore throat and trouble swallowing.   Eyes:  Negative for eye problems.  Respiratory:  Negative for cough and shortness of breath.   Cardiovascular:  Negative for chest pain, leg swelling and palpitations.  Gastrointestinal:  Positive for diarrhea. Negative for abdominal pain, constipation, nausea and vomiting.  Genitourinary:  Negative for bladder incontinence, difficulty urinating, dysuria, frequency, hematuria and nocturia.   Musculoskeletal:  Negative for arthralgias, back pain, flank pain, myalgias and neck pain.  Skin:  Negative for itching and rash.  Neurological:  Negative for dizziness, headaches and numbness.  Hematological:  Does not bruise/bleed easily.  Psychiatric/Behavioral:  Negative for depression, sleep disturbance and suicidal ideas. The  patient is not nervous/anxious.   All other systems reviewed and are negative.    VITALS:   Blood pressure (!) 153/70, pulse 65, temperature 97.8 F (36.6 C), temperature source Oral, resp. rate 16, weight 159 lb 4.8 oz (72.3 kg), SpO2 99 %.  Wt Readings from Last 3 Encounters:  09/07/22 159 lb 4.8 oz (72.3 kg)  08/17/22 156 lb 9.6 oz (71 kg)  07/06/22 157 lb 8 oz (71.4 kg)    Body mass index is 28.22 kg/m.  Performance status (ECOG): 1 - Symptomatic but completely ambulatory  PHYSICAL EXAM:   Physical Exam Vitals and nursing note reviewed. Exam conducted with a chaperone present.  Constitutional:      Appearance: Normal appearance.  Cardiovascular:     Rate and Rhythm: Normal rate and regular rhythm.     Pulses: Normal pulses.     Heart sounds: Normal heart sounds.  Pulmonary:      Effort: Pulmonary effort is normal.     Breath sounds: Normal breath sounds.  Abdominal:     Palpations: Abdomen is soft. There is no hepatomegaly, splenomegaly or mass.     Tenderness: There is no abdominal tenderness.  Musculoskeletal:     Right lower leg: No edema.     Left lower leg: No edema.  Lymphadenopathy:     Cervical: No cervical adenopathy.     Right cervical: No superficial, deep or posterior cervical adenopathy.    Left cervical: No superficial, deep or posterior cervical adenopathy.     Upper Body:     Right upper body: No supraclavicular or axillary adenopathy.     Left upper body: No supraclavicular or axillary adenopathy.  Neurological:     General: No focal deficit present.     Mental Status: She is alert and oriented to person, place, and time.  Psychiatric:        Mood and Affect: Mood normal.        Behavior: Behavior normal.     LABS:      Latest Ref Rng & Units 09/07/2022   12:05 PM 08/17/2022   12:51 PM 07/27/2022   12:32 PM  CBC  WBC 4.0 - 10.5 K/uL 5.5  6.1  6.3   Hemoglobin 12.0 - 15.0 g/dL 13.3  13.4  13.5   Hematocrit 36.0 - 46.0 % 40.9  41.7  41.7   Platelets 150 - 400 K/uL 189  185  189       Latest Ref Rng & Units 09/07/2022   12:05 PM 08/17/2022   12:51 PM 07/27/2022   12:32 PM  CMP  Glucose 70 - 99 mg/dL 92  95  95   BUN 8 - 23 mg/dL '14  15  13   '$ Creatinine 0.44 - 1.00 mg/dL 0.65  0.74  0.64   Sodium 135 - 145 mmol/L 134  137  136   Potassium 3.5 - 5.1 mmol/L 4.2  4.1  3.7   Chloride 98 - 111 mmol/L 102  101  99   CO2 22 - 32 mmol/L '25  26  28   '$ Calcium 8.9 - 10.3 mg/dL 9.0  9.3  9.3   Total Protein 6.5 - 8.1 g/dL 7.9  8.2  8.3   Total Bilirubin 0.3 - 1.2 mg/dL 0.3  0.6  0.7   Alkaline Phos 38 - 126 U/L 69  72  75   AST 15 - 41 U/L '26  30  26   '$ ALT 0 - 44 U/L 21  20  20      Lab Results  Component Value Date   CEA1 5.6 (H) 08/17/2022   CEA 7.7 (H) 08/26/2020   /  CEA  Date Value Ref Range Status  08/17/2022 5.6 (H) 0.0 - 4.7  ng/mL Final    Comment:    (NOTE)                             Nonsmokers          <3.9                             Smokers             <5.6 Roche [employer] Electrochemiluminescence Immunoassay (ECLIA) Values obtained with different assay methods or kits cannot be used interchangeably.  Results cannot be interpreted as absolute evidence of the presence or absence of malignant disease. Performed At: [facility] [place], [place] [id number] Rush Farmer MD [id number]   08/26/2020 7.7 (H) ng/mL Final    Comment:    Non-Smoker: <2.5 Smoker:     <5.0 . . This test was performed using the Siemens  chemiluminescent method. Values obtained from different assay methods cannot be used interchangeably. CEA levels, regardless of value, should not be interpreted as absolute evidence of the presence or absence of disease. .    No results found for: "PSA1" No results found for: "[id number]" No results found for: "CAN125"  No results found for: "TOTALPROTELP", "ALBUMINELP", "A1GS", "A2GS", "BETS", "BETA2SER", "GAMS", "MSPIKE", "SPEI" Lab Results  Component Value Date   FERRITIN 119 10/28/2018   FERRITIN 63 02/17/2014   Lab Results  Component Value Date   LDH 170 10/28/2018   LDH 153 04/25/2018     STUDIES:   No results found.

## 2022-09-07 NOTE — Patient Instructions (Signed)
[facility]  Discharge Instructions: Thank you for choosing [facility] to provide your oncology and hematology care.  If you have a lab appointment with the [facility], please come in thru the Main Entrance and check in at the main information desk.  Wear comfortable clothing and clothing appropriate for easy access to any Portacath or PICC line.   We strive to give you quality time with your provider. You may need to reschedule your appointment if you arrive late (15 or more minutes).  Arriving late affects you and other patients whose appointments are after yours.  Also, if you miss three or more appointments without notifying the office, you may be dismissed from the clinic at the provider's discretion.      For prescription refill requests, have your pharmacy contact our office and allow 72 hours for refills to be completed.    Today you received the following chemotherapy and/or immunotherapy agents Keytruda.  Pembrolizumab Injection What is this medication? PEMBROLIZUMAB (PEM broe LIZ ue mab) treats some types of cancer. It works by helping your immune system slow or stop the spread of cancer cells. It is a monoclonal antibody. This medicine may be used for other purposes; ask your health care provider or pharmacist if you have questions. COMMON BRAND NAME(S): Keytruda What should I tell my care team before I take this medication? They need to know if you have any of these conditions: Allogeneic stem cell transplant (uses someone else's stem cells) Autoimmune diseases, such as Crohn disease, ulcerative colitis, lupus History of chest radiation Nervous system problems, such as Guillain-Barre syndrome, myasthenia gravis Organ transplant An unusual or allergic reaction to pembrolizumab, other medications, foods, dyes, or preservatives Pregnant or trying to get pregnant Breast-feeding How should I use this medication? This medication is injected  into a vein. It is given by your care team in a hospital or clinic setting. A special MedGuide will be given to you before each treatment. Be sure to read this information carefully each time. Talk to your care team about the use of this medication in children. While it may be prescribed for children as young as 6 months for selected conditions, precautions do apply. Overdosage: If you think you have taken too much of this medicine contact a poison control center or emergency room at once. NOTE: This medicine is only for you. Do not share this medicine with others. What if I miss a dose? Keep appointments for follow-up doses. It is important not to miss your dose. Call your care team if you are unable to keep an appointment. What may interact with this medication? Interactions have not been studied. This list may not describe all possible interactions. Give your health care provider a list of all the medicines, herbs, non-prescription drugs, or dietary supplements you use. Also tell them if you smoke, drink alcohol, or use illegal drugs. Some items may interact with your medicine. What should I watch for while using this medication? Your condition will be monitored carefully while you are receiving this medication. You may need blood work while taking this medication. This medication may cause serious skin reactions. They can happen weeks to months after starting the medication. Contact your care team right away if you notice fevers or flu-like symptoms with a rash. The rash may be red or purple and then turn into blisters or peeling of the skin. You may also notice a red rash with swelling of the face, lips, or lymph  nodes in your neck or under your arms. Tell your care team right away if you have any change in your eyesight. Talk to your care team if you may be pregnant. Serious birth defects can occur if you take this medication during pregnancy and for 4 months after the last dose. You will need a  negative pregnancy test before starting this medication. Contraception is recommended while taking this medication and for 4 months after the last dose. Your care team can help you find the option that works for you. Do not breastfeed while taking this medication and for 4 months after the last dose. What side effects may I notice from receiving this medication? Side effects that you should report to your care team as soon as possible: Allergic reactions--skin rash, itching, hives, swelling of the face, lips, tongue, or throat Dry cough, shortness of breath or trouble breathing Eye pain, redness, irritation, or discharge with blurry or decreased vision Heart muscle inflammation--unusual weakness or fatigue, shortness of breath, chest pain, fast or irregular heartbeat, dizziness, swelling of the ankles, feet, or hands Hormone gland problems--headache, sensitivity to light, unusual weakness or fatigue, dizziness, fast or irregular heartbeat, increased sensitivity to cold or heat, excessive sweating, constipation, hair loss, increased thirst or amount of urine, tremors or shaking, irritability Infusion reactions--chest pain, shortness of breath or trouble breathing, feeling faint or lightheaded Kidney injury (glomerulonephritis)--decrease in the amount of urine, red or dark [place] Bartosiewicz urine, foamy or bubbly urine, swelling of the ankles, hands, or feet Liver injury--right upper belly pain, loss of appetite, nausea, light-colored stool, dark yellow or Brandyn Lowrey urine, yellowing skin or eyes, unusual weakness or fatigue Pain, tingling, or numbness in the hands or feet, muscle weakness, change in vision, confusion or trouble speaking, loss of balance or coordination, trouble walking, seizures Rash, fever, and swollen lymph nodes Redness, blistering, peeling, or loosening of the skin, including inside the mouth Sudden or severe stomach pain, bloody diarrhea, fever, nausea, vomiting Side effects that usually do not  require medical attention (report to your care team if they continue or are bothersome): Bone, joint, or muscle pain Diarrhea Fatigue Loss of appetite Nausea Skin rash This list may not describe all possible side effects. Call your doctor for medical advice about side effects. You may report side effects to FDA at 1-800-FDA-1088. Where should I keep my medication? This medication is given in a hospital or clinic. It will not be stored at home. NOTE: This sheet is a summary. It may not cover all possible information. If you have questions about this medicine, talk to your doctor, pharmacist, or health care provider.  2023 Elsevier/Gold Standard (2021-11-08 00:00:00)        To help prevent nausea and vomiting after your treatment, we encourage you to take your nausea medication as directed.  BELOW ARE SYMPTOMS THAT SHOULD BE REPORTED IMMEDIATELY: *FEVER GREATER THAN 100.4 F (38 C) OR HIGHER *CHILLS OR SWEATING *NAUSEA AND VOMITING THAT IS NOT CONTROLLED WITH YOUR NAUSEA MEDICATION *UNUSUAL SHORTNESS OF BREATH *UNUSUAL BRUISING OR BLEEDING *URINARY PROBLEMS (pain or burning when urinating, or frequent urination) *BOWEL PROBLEMS (unusual diarrhea, constipation, pain near the anus) TENDERNESS IN MOUTH AND THROAT WITH OR WITHOUT PRESENCE OF ULCERS (sore throat, sores in mouth, or a toothache) UNUSUAL RASH, SWELLING OR PAIN  UNUSUAL VAGINAL DISCHARGE OR ITCHING   Items with * indicate a potential emergency and should be followed up as soon as possible or go to the Emergency Department if any problems should occur.  Please show the CHEMOTHERAPY ALERT CARD or IMMUNOTHERAPY ALERT CARD at check-in to the Emergency Department and triage nurse.  Should you have questions after your visit or need to cancel or reschedule your appointment, please contact [facility] [phone]  and follow the prompts.  Office hours are 8:00 a.m. to 4:30 p.m. Monday - Friday. Please note  that voicemails left after 4:00 p.m. may not be returned until the following business day.  We are closed weekends and major holidays. You have access to a nurse at all times for urgent questions. Please call the main number to the clinic [phone] and follow the prompts.  For any non-urgent questions, you may also contact your provider using MyChart. We now offer e-Visits for anyone 8 and older to request care online for non-urgent symptoms. For details visit [email or web address].   Also download the MyChart app! Go to the app store, search "MyChart", open the app, select [facility], and log in with your MyChart username and password.

## 2022-09-08 ENCOUNTER — Other Ambulatory Visit: Payer: Self-pay

## 2022-09-10 ENCOUNTER — Other Ambulatory Visit: Payer: Self-pay

## 2022-09-14 ENCOUNTER — Ambulatory Visit (HOSPITAL_COMMUNITY): Payer: Medicare Other

## 2022-09-28 ENCOUNTER — Inpatient Hospital Stay: Payer: Medicare Other

## 2022-09-28 ENCOUNTER — Encounter: Payer: Self-pay | Admitting: Hematology

## 2022-09-28 ENCOUNTER — Inpatient Hospital Stay: Payer: Medicare Other | Attending: Hematology

## 2022-09-28 VITALS — BP 103/69 | HR 64 | Temp 97.6°F | Resp 18 | Wt 160.4 lb

## 2022-09-28 DIAGNOSIS — C184 Malignant neoplasm of transverse colon: Secondary | ICD-10-CM | POA: Diagnosis not present

## 2022-09-28 DIAGNOSIS — C189 Malignant neoplasm of colon, unspecified: Secondary | ICD-10-CM

## 2022-09-28 DIAGNOSIS — C778 Secondary and unspecified malignant neoplasm of lymph nodes of multiple regions: Secondary | ICD-10-CM | POA: Insufficient documentation

## 2022-09-28 DIAGNOSIS — C7800 Secondary malignant neoplasm of unspecified lung: Secondary | ICD-10-CM | POA: Insufficient documentation

## 2022-09-28 DIAGNOSIS — Z5112 Encounter for antineoplastic immunotherapy: Secondary | ICD-10-CM | POA: Insufficient documentation

## 2022-09-28 DIAGNOSIS — C787 Secondary malignant neoplasm of liver and intrahepatic bile duct: Secondary | ICD-10-CM | POA: Insufficient documentation

## 2022-09-28 DIAGNOSIS — Z95828 Presence of other vascular implants and grafts: Secondary | ICD-10-CM

## 2022-09-28 DIAGNOSIS — Z79899 Other long term (current) drug therapy: Secondary | ICD-10-CM | POA: Insufficient documentation

## 2022-09-28 LAB — COMPREHENSIVE METABOLIC PANEL
ALT: 21 U/L (ref 0–44)
AST: 25 U/L (ref 15–41)
Albumin: 4.3 g/dL (ref 3.5–5.0)
Alkaline Phosphatase: 72 U/L (ref 38–126)
Anion gap: 11 (ref 5–15)
BUN: 12 mg/dL (ref 8–23)
CO2: 25 mmol/L (ref 22–32)
Calcium: 9.1 mg/dL (ref 8.9–10.3)
Chloride: 101 mmol/L (ref 98–111)
Creatinine, Ser: 0.64 mg/dL (ref 0.44–1.00)
GFR, Estimated: >60 mL/min (ref >60)
Glucose, Bld: 98 mg/dL (ref 70–99)
Potassium: 3.9 mmol/L (ref 3.5–5.1)
Sodium: 137 mmol/L (ref 135–145)
Total Bilirubin: 0.7 mg/dL (ref 0.3–1.2)
Total Protein: 8.2 g/dL — ABNORMAL HIGH (ref 6.5–8.1)

## 2022-09-28 LAB — CBC WITH DIFFERENTIAL/PLATELET
Abs Immature Granulocytes: 0.02 10*3/uL (ref 0.00–0.07)
Basophils Absolute: 0 10*3/uL (ref 0.0–0.1)
Basophils Relative: 1 %
Eosinophils Absolute: 0.1 10*3/uL (ref 0.0–0.5)
Eosinophils Relative: 1 %
HCT: 42.2 % (ref 36.0–46.0)
Hemoglobin: 13.5 g/dL (ref 12.0–15.0)
Immature Granulocytes: 0 %
Lymphocytes Relative: 22 %
Lymphs Abs: 1.4 10*3/uL (ref 0.7–4.0)
MCH: 29.7 pg (ref 26.0–34.0)
MCHC: 32 g/dL (ref 30.0–36.0)
MCV: 92.7 fL (ref 80.0–100.0)
Monocytes Absolute: 0.7 10*3/uL (ref 0.1–1.0)
Monocytes Relative: 10 %
Neutro Abs: 4.2 10*3/uL (ref 1.7–7.7)
Neutrophils Relative %: 66 %
Platelets: 189 10*3/uL (ref 150–400)
RBC: 4.55 MIL/uL (ref 3.87–5.11)
RDW: 13.1 % (ref 11.5–15.5)
WBC: 6.4 10*3/uL (ref 4.0–10.5)
nRBC: 0 % (ref 0.0–0.2)

## 2022-09-28 LAB — MAGNESIUM: Magnesium: 2.2 mg/dL (ref 1.7–2.4)

## 2022-09-28 LAB — TSH: TSH: 2.154 u[IU]/mL (ref 0.350–4.500)

## 2022-09-28 MED ORDER — SODIUM CHLORIDE 0.9 % IV SOLN
200.0000 mg | Freq: Once | INTRAVENOUS | Status: AC
  Administered 2022-09-28: 200 mg via INTRAVENOUS
  Filled 2022-09-28: qty 8

## 2022-09-28 MED ORDER — SODIUM CHLORIDE 0.9% FLUSH
10.0000 mL | INTRAVENOUS | Status: DC | PRN
  Administered 2022-09-28: 10 mL

## 2022-09-28 MED ORDER — HEPARIN SOD (PORK) LOCK FLUSH 100 UNIT/ML IV SOLN
500.0000 [IU] | Freq: Once | INTRAVENOUS | Status: AC | PRN
  Administered 2022-09-28: 500 [IU]

## 2022-09-28 MED ORDER — SODIUM CHLORIDE 0.9 % IV SOLN: Freq: Once | INTRAVENOUS | Status: AC

## 2022-09-28 NOTE — Patient Instructions (Signed)
[facility]  Discharge Instructions: Thank you for choosing [facility] to provide your oncology and hematology care.  If you have a lab appointment with the Cancer Center, please come in thru the Main Entrance and check in at the main information desk.  Wear comfortable clothing and clothing appropriate for easy access to any Portacath or PICC line.   We strive to give you quality time with your provider. You may need to reschedule your appointment if you arrive late (15 or more minutes).  Arriving late affects you and other patients whose appointments are after yours.  Also, if you miss three or more appointments without notifying the office, you may be dismissed from the clinic at the provider's discretion.      For prescription refill requests, have your pharmacy contact our office and allow 72 hours for refills to be completed.    Today you received the following chemotherapy and/or immunotherapy agents Keytruda.  Pembrolizumab Injection What is this medication? PEMBROLIZUMAB (PEM broe LIZ ue mab) treats some types of cancer. It works by helping your immune system slow or stop the spread of cancer cells. It is a monoclonal antibody. This medicine may be used for other purposes; ask your health care provider or pharmacist if you have questions. COMMON BRAND NAME(S): Keytruda What should I tell my care team before I take this medication? They need to know if you have any of these conditions: Allogeneic stem cell transplant (uses someone else's stem cells) Autoimmune diseases, such as Crohn disease, ulcerative colitis, lupus History of chest radiation Nervous system problems, such as Guillain-Barre syndrome, myasthenia gravis Organ transplant An unusual or allergic reaction to pembrolizumab, other medications, foods, dyes, or preservatives Pregnant or trying to get pregnant Breast-feeding How should I use this medication? This medication is injected  into a vein. It is given by your care team in a hospital or clinic setting. A special MedGuide will be given to you before each treatment. Be sure to read this information carefully each time. Talk to your care team about the use of this medication in children. While it may be prescribed for children as young as 6 months for selected conditions, precautions do apply. Overdosage: If you think you have taken too much of this medicine contact a poison control center or emergency room at once. NOTE: This medicine is only for you. Do not share this medicine with others. What if I miss a dose? Keep appointments for follow-up doses. It is important not to miss your dose. Call your care team if you are unable to keep an appointment. What may interact with this medication? Interactions have not been studied. This list may not describe all possible interactions. Give your health care provider a list of all the medicines, herbs, non-prescription drugs, or dietary supplements you use. Also tell them if you smoke, drink alcohol, or use illegal drugs. Some items may interact with your medicine. What should I watch for while using this medication? Your condition will be monitored carefully while you are receiving this medication. You may need blood work while taking this medication. This medication may cause serious skin reactions. They can happen weeks to months after starting the medication. Contact your care team right away if you notice fevers or flu-like symptoms with a rash. The rash may be red or purple and then turn into blisters or peeling of the skin. You may also notice a red rash with swelling of the face, lips, or lymph   nodes in your neck or under your arms. Tell your care team right away if you have any change in your eyesight. Talk to your care team if you may be pregnant. Serious birth defects can occur if you take this medication during pregnancy and for 4 months after the last dose. You will need a  negative pregnancy test before starting this medication. Contraception is recommended while taking this medication and for 4 months after the last dose. Your care team can help you find the option that works for you. Do not breastfeed while taking this medication and for 4 months after the last dose. What side effects may I notice from receiving this medication? Side effects that you should report to your care team as soon as possible: Allergic reactions--skin rash, itching, hives, swelling of the face, lips, tongue, or throat Dry cough, shortness of breath or trouble breathing Eye pain, redness, irritation, or discharge with blurry or decreased vision Heart muscle inflammation--unusual weakness or fatigue, shortness of breath, chest pain, fast or irregular heartbeat, dizziness, swelling of the ankles, feet, or hands Hormone gland problems--headache, sensitivity to light, unusual weakness or fatigue, dizziness, fast or irregular heartbeat, increased sensitivity to cold or heat, excessive sweating, constipation, hair loss, increased thirst or amount of urine, tremors or shaking, irritability Infusion reactions--chest pain, shortness of breath or trouble breathing, feeling faint or lightheaded Kidney injury (glomerulonephritis)--decrease in the amount of urine, red or dark brown urine, foamy or bubbly urine, swelling of the ankles, hands, or feet Liver injury--right upper belly pain, loss of appetite, nausea, light-colored stool, dark yellow or brown urine, yellowing skin or eyes, unusual weakness or fatigue Pain, tingling, or numbness in the hands or feet, muscle weakness, change in vision, confusion or trouble speaking, loss of balance or coordination, trouble walking, seizures Rash, fever, and swollen lymph nodes Redness, blistering, peeling, or loosening of the skin, including inside the mouth Sudden or severe stomach pain, bloody diarrhea, fever, nausea, vomiting Side effects that usually do not  require medical attention (report to your care team if they continue or are bothersome): Bone, joint, or muscle pain Diarrhea Fatigue Loss of appetite Nausea Skin rash This list may not describe all possible side effects. Call your doctor for medical advice about side effects. You may report side effects to FDA at 1-800-FDA-1088. Where should I keep my medication? This medication is given in a hospital or clinic. It will not be stored at home. NOTE: This sheet is a summary. It may not cover all possible information. If you have questions about this medicine, talk to your doctor, pharmacist, or health care provider.  2023 Elsevier/Gold Standard (2021-11-08 00:00:00)        To help prevent nausea and vomiting after your treatment, we encourage you to take your nausea medication as directed.  BELOW ARE SYMPTOMS THAT SHOULD BE REPORTED IMMEDIATELY: *FEVER GREATER THAN 100.4 F (38 C) OR HIGHER *CHILLS OR SWEATING *NAUSEA AND VOMITING THAT IS NOT CONTROLLED WITH YOUR NAUSEA MEDICATION *UNUSUAL SHORTNESS OF BREATH *UNUSUAL BRUISING OR BLEEDING *URINARY PROBLEMS (pain or burning when urinating, or frequent urination) *BOWEL PROBLEMS (unusual diarrhea, constipation, pain near the anus) TENDERNESS IN MOUTH AND THROAT WITH OR WITHOUT PRESENCE OF ULCERS (sore throat, sores in mouth, or a toothache) UNUSUAL RASH, SWELLING OR PAIN  UNUSUAL VAGINAL DISCHARGE OR ITCHING   Items with * indicate a potential emergency and should be followed up as soon as possible or go to the Emergency Department if any problems should occur.    Please show the CHEMOTHERAPY ALERT CARD or IMMUNOTHERAPY ALERT CARD at check-in to the Emergency Department and triage nurse.  Should you have questions after your visit or need to cancel or reschedule your appointment, please contact [facility] [phone]  and follow the prompts.  Office hours are 8:00 a.m. to 4:30 p.m. Monday - Friday. Please note  that voicemails left after 4:00 p.m. may not be returned until the following business day.  We are closed weekends and major holidays. You have access to a nurse at all times for urgent questions. Please call the main number to the clinic [phone] and follow the prompts.  For any non-urgent questions, you may also contact your provider using MyChart. We now offer e-Visits for anyone 18 and older to request care online for non-urgent symptoms. For details visit [email or web address].   Also download the MyChart app! Go to the app store, search "MyChart", open the app, select [facility], and log in with your MyChart username and password.   

## 2022-09-28 NOTE — Progress Notes (Signed)
Patient presents today for immunotherapy infusion, Keytruda.  Patient is in satisfactory condition with no new complaints voiced.  Vital signs are stable.   We will proceed with treatment per MD orders.    Patient tolerated treatment well with no complaints voiced.  Patient left ambulatory in stable condition.  Vital signs stable at discharge.  Follow up as scheduled.

## 2022-09-30 LAB — T4: T4, Total: 9 ug/dL (ref 4.5–12.0)

## 2022-09-30 LAB — CEA: CEA: 6.1 ng/mL — ABNORMAL HIGH (ref 0.0–4.7)

## 2022-10-10 ENCOUNTER — Other Ambulatory Visit: Payer: Self-pay

## 2022-10-13 ENCOUNTER — Encounter: Payer: Self-pay | Admitting: Hematology

## 2022-10-19 ENCOUNTER — Inpatient Hospital Stay: Payer: Medicare Other

## 2022-10-19 ENCOUNTER — Inpatient Hospital Stay: Payer: Medicare Other | Attending: Hematology

## 2022-10-19 VITALS — BP 118/67 | HR 55 | Temp 98.1°F | Resp 18

## 2022-10-19 DIAGNOSIS — Z79899 Other long term (current) drug therapy: Secondary | ICD-10-CM | POA: Insufficient documentation

## 2022-10-19 DIAGNOSIS — Z5112 Encounter for antineoplastic immunotherapy: Secondary | ICD-10-CM | POA: Insufficient documentation

## 2022-10-19 DIAGNOSIS — Z95828 Presence of other vascular implants and grafts: Secondary | ICD-10-CM

## 2022-10-19 DIAGNOSIS — C778 Secondary and unspecified malignant neoplasm of lymph nodes of multiple regions: Secondary | ICD-10-CM | POA: Insufficient documentation

## 2022-10-19 DIAGNOSIS — C787 Secondary malignant neoplasm of liver and intrahepatic bile duct: Secondary | ICD-10-CM | POA: Insufficient documentation

## 2022-10-19 DIAGNOSIS — C189 Malignant neoplasm of colon, unspecified: Secondary | ICD-10-CM

## 2022-10-19 DIAGNOSIS — C7800 Secondary malignant neoplasm of unspecified lung: Secondary | ICD-10-CM | POA: Insufficient documentation

## 2022-10-19 DIAGNOSIS — C184 Malignant neoplasm of transverse colon: Secondary | ICD-10-CM | POA: Insufficient documentation

## 2022-10-19 LAB — COMPREHENSIVE METABOLIC PANEL
ALT: 20 U/L (ref 0–44)
AST: 24 U/L (ref 15–41)
Albumin: 4.2 g/dL (ref 3.5–5.0)
Alkaline Phosphatase: 63 U/L (ref 38–126)
Anion gap: 7 (ref 5–15)
BUN: 14 mg/dL (ref 8–23)
CO2: 24 mmol/L (ref 22–32)
Calcium: 9.1 mg/dL (ref 8.9–10.3)
Chloride: 103 mmol/L (ref 98–111)
Creatinine, Ser: 0.65 mg/dL (ref 0.44–1.00)
GFR, Estimated: >60 mL/min (ref >60)
Glucose, Bld: 92 mg/dL (ref 70–99)
Potassium: 4.3 mmol/L (ref 3.5–5.1)
Sodium: 134 mmol/L — ABNORMAL LOW (ref 135–145)
Total Bilirubin: 0.4 mg/dL (ref 0.3–1.2)
Total Protein: 7.6 g/dL (ref 6.5–8.1)

## 2022-10-19 LAB — CBC WITH DIFFERENTIAL/PLATELET
Abs Immature Granulocytes: 0.03 10*3/uL (ref 0.00–0.07)
Basophils Absolute: 0 10*3/uL (ref 0.0–0.1)
Basophils Relative: 1 %
Eosinophils Absolute: 0.1 10*3/uL (ref 0.0–0.5)
Eosinophils Relative: 2 %
HCT: 39 % (ref 36.0–46.0)
Hemoglobin: 12.7 g/dL (ref 12.0–15.0)
Immature Granulocytes: 0 %
Lymphocytes Relative: 21 %
Lymphs Abs: 1.5 10*3/uL (ref 0.7–4.0)
MCH: 30.2 pg (ref 26.0–34.0)
MCHC: 32.6 g/dL (ref 30.0–36.0)
MCV: 92.9 fL (ref 80.0–100.0)
Monocytes Absolute: 0.7 10*3/uL (ref 0.1–1.0)
Monocytes Relative: 10 %
Neutro Abs: 4.7 10*3/uL (ref 1.7–7.7)
Neutrophils Relative %: 66 %
Platelets: 201 10*3/uL (ref 150–400)
RBC: 4.2 MIL/uL (ref 3.87–5.11)
RDW: 13.1 % (ref 11.5–15.5)
WBC: 7.1 10*3/uL (ref 4.0–10.5)
nRBC: 0 % (ref 0.0–0.2)

## 2022-10-19 LAB — MAGNESIUM: Magnesium: 2.3 mg/dL (ref 1.7–2.4)

## 2022-10-19 MED ORDER — SODIUM CHLORIDE 0.9 % IV SOLN: Freq: Once | INTRAVENOUS | Status: AC

## 2022-10-19 MED ORDER — HEPARIN SOD (PORK) LOCK FLUSH 100 UNIT/ML IV SOLN
500.0000 [IU] | Freq: Once | INTRAVENOUS | Status: AC | PRN
  Administered 2022-10-19: 500 [IU]

## 2022-10-19 MED ORDER — SODIUM CHLORIDE 0.9 % IV SOLN
200.0000 mg | Freq: Once | INTRAVENOUS | Status: AC
  Administered 2022-10-19: 200 mg via INTRAVENOUS
  Filled 2022-10-19: qty 8

## 2022-10-19 MED ORDER — SODIUM CHLORIDE 0.9% FLUSH
10.0000 mL | INTRAVENOUS | Status: DC | PRN
  Administered 2022-10-19: 10 mL

## 2022-10-19 NOTE — Progress Notes (Signed)
Patient presents today for Keytruda infusion per providers order.  Vital signs and labs within parameters for treatment.  Patient has no new complaints at this time.  Treatment given today per MD orders.  Keytruda infusion without adverse affects.  Vital signs stable.  No complaints at this time.  Discharge from clinic ambulatory in stable condition.  Alert and oriented X 3.  Follow up with [facility] as scheduled.

## 2022-10-19 NOTE — Patient Instructions (Signed)
[facility]  Discharge Instructions: Thank you for choosing [facility] to provide your oncology and hematology care.  If you have a lab appointment with the Cancer Center - please note that after April 8th, 2024, all labs will be drawn in the cancer center.  You do not have to check in or register with the main entrance as you have in the past but will complete your check-in in the cancer center.  Wear comfortable clothing and clothing appropriate for easy access to any Portacath or PICC line.   We strive to give you quality time with your provider. You may need to reschedule your appointment if you arrive late (15 or more minutes).  Arriving late affects you and other patients whose appointments are after yours.  Also, if you miss three or more appointments without notifying the office, you may be dismissed from the clinic at the provider's discretion.      For prescription refill requests, have your pharmacy contact our office and allow 72 hours for refills to be completed.    Today you received the following chemotherapy and/or immunotherapy agents Keytruda      To help prevent nausea and vomiting after your treatment, we encourage you to take your nausea medication as directed.  BELOW ARE SYMPTOMS THAT SHOULD BE REPORTED IMMEDIATELY: *FEVER GREATER THAN 100.4 F (38 C) OR HIGHER *CHILLS OR SWEATING *NAUSEA AND VOMITING THAT IS NOT CONTROLLED WITH YOUR NAUSEA MEDICATION *UNUSUAL SHORTNESS OF BREATH *UNUSUAL BRUISING OR BLEEDING *URINARY PROBLEMS (pain or burning when urinating, or frequent urination) *BOWEL PROBLEMS (unusual diarrhea, constipation, pain near the anus) TENDERNESS IN MOUTH AND THROAT WITH OR WITHOUT PRESENCE OF ULCERS (sore throat, sores in mouth, or a toothache) UNUSUAL RASH, SWELLING OR PAIN  UNUSUAL VAGINAL DISCHARGE OR ITCHING   Items with * indicate a potential emergency and should be followed up as soon as possible or go to the  Emergency Department if any problems should occur.  Please show the CHEMOTHERAPY ALERT CARD or IMMUNOTHERAPY ALERT CARD at check-in to the Emergency Department and triage nurse.  Should you have questions after your visit or need to cancel or reschedule your appointment, please contact [facility] [phone]  and follow the prompts.  Office hours are 8:00 a.m. to 4:30 p.m. Monday - Friday. Please note that voicemails left after 4:00 p.m. may not be returned until the following business day.  We are closed weekends and major holidays. You have access to a nurse at all times for urgent questions. Please call the main number to the clinic [phone] and follow the prompts.  For any non-urgent questions, you may also contact your provider using MyChart. We now offer e-Visits for anyone 18 and older to request care online for non-urgent symptoms. For details visit [email or web address].   Also download the MyChart app! Go to the app store, search "MyChart", open the app, select [facility], and log in with your MyChart username and password.   

## 2022-10-21 LAB — CEA: CEA: 5.5 ng/mL — ABNORMAL HIGH (ref 0.0–4.7)

## 2022-10-24 ENCOUNTER — Other Ambulatory Visit: Payer: Self-pay

## 2022-10-31 ENCOUNTER — Other Ambulatory Visit: Payer: Self-pay

## 2022-11-02 ENCOUNTER — Other Ambulatory Visit: Payer: Self-pay

## 2022-11-02 ENCOUNTER — Ambulatory Visit (HOSPITAL_COMMUNITY)
Admission: RE | Admit: 2022-11-02 | Discharge: 2022-11-02 | Disposition: A | Discharge status: Home / Self Care | Payer: Medicare Other | Source: Ambulatory Visit | Attending: Hematology | Admitting: Hematology

## 2022-11-02 DIAGNOSIS — C189 Malignant neoplasm of colon, unspecified: Secondary | ICD-10-CM | POA: Insufficient documentation

## 2022-11-02 DIAGNOSIS — C787 Secondary malignant neoplasm of liver and intrahepatic bile duct: Secondary | ICD-10-CM | POA: Insufficient documentation

## 2022-11-02 DIAGNOSIS — R918 Other nonspecific abnormal finding of lung field: Secondary | ICD-10-CM | POA: Diagnosis not present

## 2022-11-02 MED ORDER — IOHEXOL 300 MG/ML  SOLN
100.0000 mL | Freq: Once | INTRAMUSCULAR | Status: AC | PRN
  Administered 2022-11-02: 100 mL via INTRAVENOUS

## 2022-11-08 NOTE — Progress Notes (Signed)
[facility] [address], [place] [address]    Clinic Day:  11/09/2022  Referring physician: Elfredia Nevins, MD  Patient Care Team: Elfredia Nevins, MD as PCP - General (Internal Medicine) Franky Macho, MD as Consulting Physician (General Surgery) Doreatha Massed, MD as Medical Oncologist (Medical Oncology)   ASSESSMENT & PLAN:   Assessment: 1.  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 - Status post 3 cycles of FOLFOX from 02/10/2014 through 03/10/2014, discontinued secondary to poor tolerance.  DPD was negative. -Last colonoscopy on 03/28/2018 patent ileo-colonic anastomosis.  8 mm polyp in the transverse colon.  Diverticulosis in the sigmoid colon, descending colon. - CT scan on 11/04/2018 shows postoperative findings of right colectomy with redemonstrated mesenteric nodule/lymph node measuring 1.9 x 1.7 cm, unchanged from prior scan in October 2019.  No evidence of metastatic disease.  Stable solid and groundglass pulmonary nodules, stable over multiple prior exams. -CEA was 4.6 on 10/28/2018. -CTAP on 09/21/2020 showed numerous small pulmonary nodules throughout the lung bases, 5 mm.  Enlarged retrocrural periaortic lymph node in the lower chest.  Soft tissue nodule within the central mesentery substantially increased in size, encasing proximal superior mesenteric artery and measuring 5 x 4.3 cm.  Numerous retroperitoneal lymph nodes, largest aortocaval lymph node measuring 2.8 x 2.2 cm. -CT chest on 09/27/2020 shows a left thoracic inlet lymph node approximately 2.3 cm.  Mediastinal lymph nodes and multiple subcentimeter pulmonary nodules suggestive of metastatic disease. -Mesenteric lymph node biopsy on 09/28/2020 shows mucin and fibrosis. -Left supraclavicular lymph node biopsy on 10/14/2020-soft tissue with abundant  mucin, rare fragments of atypical columnar epithelium. - 3 cycles of FOLFIRI and bevacizumab dose reduced from 11/10/2020 through 12/08/2020. - Caris test-BRAF V600 E+, MMR deficient, MSI high, TMB high, HER2 negative, BRCA1/2 pathogenic variant on exon 14 and exon 9 respectively.  The report also suggested decreased benefit to FOLFOX and bevacizumab in the first-line metastatic setting. - Cycle 1 of Keytruda on 01/13/2021.    Plan: 1.  Metastatic colon cancer to the liver, lungs and lymph nodes, BRAF V600E+: - She is tolerating immunotherapy reasonably well. - CT CAP (11/02/2022): Stable to slightly improved disease. - Labs today: Normal LFTs and CBC.  TSH is 2.1.  Last CEA is 5.5. - She reports that her head feels heavy and clogged to the extent that something is wrong in the head.  She denies any visual or auditory changes. - I have recommended MRI with and without contrast.  If she does not have any metastatic disease, will refer her to ENT evaluation. - She will proceed with treatment today and in 3 weeks.  RTC 9 weeks for follow-up.   2.  Diarrhea: - Baseline diarrhea stable.  Continue Imodium as needed.    Orders Placed This Encounter  Procedures   MR Brain W Wo Contrast    Standing Status:   Future    Standing Expiration Date:   11/09/2023    Order Specific Question:   If indicated for the ordered procedure, I authorize the administration of contrast media per Radiology protocol    Answer:   Yes    Order Specific Question:   What is the patient's sedation requirement?    Answer:   No Sedation    Order Specific Question:   Does the patient have a pacemaker or implanted devices?    Answer:  No    Order Specific Question:   Use SRS Protocol?    Answer:   No    Order Specific Question:   Preferred imaging location?    Answer:   [facility] (table limit [phone])   Magnesium    Standing Status:   Future    Standing Expiration Date:   02/01/2024   CBC with Differential     Standing Status:   Future    Standing Expiration Date:   02/01/2024   Comprehensive metabolic panel    Standing Status:   Future    Standing Expiration Date:   02/01/2024   Magnesium    Standing Status:   Future    Standing Expiration Date:   02/22/2024   CBC with Differential    Standing Status:   Future    Standing Expiration Date:   02/22/2024   Comprehensive metabolic panel    Standing Status:   Future    Standing Expiration Date:   02/22/2024   Magnesium    Standing Status:   Future    Standing Expiration Date:   03/14/2024   CBC with Differential    Standing Status:   Future    Standing Expiration Date:   03/14/2024   Comprehensive metabolic panel    Standing Status:   Future    Standing Expiration Date:   03/14/2024   Magnesium    Standing Status:   Future    Standing Expiration Date:   04/04/2024   CBC with Differential    Standing Status:   Future    Standing Expiration Date:   04/04/2024   Comprehensive metabolic panel    Standing Status:   Future    Standing Expiration Date:   04/04/2024      I,Katie Daubenspeck,acting as a scribe for Doreatha Massed, MD.,have documented all relevant documentation on the behalf of Doreatha Massed, MD,as directed by  Doreatha Massed, MD while in the presence of Doreatha Massed, MD.   I, Doreatha Massed MD, have reviewed the above documentation for accuracy and completeness, and I agree with the above.   Doreatha Massed, MD   5/2/20244:23 PM  CHIEF COMPLAINT:   Diagnosis: metastatic transverse colon adenocarcinoma    Cancer Staging  No matching staging information was found for the patient.   Prior Therapy: 1. Right hemicolectomy on 12/29/2013. 2. FOLFOX x 3 cycles from 02/10/2014 to 03/10/2014, stopped secondary to poor tolerance  Current Therapy:  Keytruda every 3 weeks    HISTORY OF PRESENT ILLNESS:   Oncology History  Adenocarcinoma of transverse colon (HCC) (Resolved)  12/09/2013 Initial Diagnosis    1. Colon, biopsy, proximal transverse mass INVASIVE ADENOCARCINOMA.   12/29/2013 Definitive Surgery   Colon, segmental resection for tumor, right - INVASIVE COLORECTAL ADENOCARCINOMA, 7 CM, EXTENDING INTO PERICOLONIC CONNECTIVE TISSUE. - METASTATIC CARCINOMA IN 6 OF 33 LYMPH NODES (6/33).   02/10/2014 - 03/10/2014 Adjuvant Chemotherapy   FOLFOX x 3 cycles   03/11/2014 Adverse Reaction   Patient called reporting diarrhea despite dose reduction and she wants to stop therapy.  Patient seen on 03/24/14 to discuss other treatment options and she stands by her decision to stop all therapy.   04/01/2014 Surgery   Port-a-cath removal by Dr. Lovell Sheehan.   01/12/2015 PET scan   Mild abdominal mesenteric LAD show hypermetabolic activity, 7 mm indeterminate pulm nodule in sup segment of RLL shows no assoc metabolic activity   03/24/2015 PET scan   Mild progression of nodal mets in anterior mid  abdominal mesentery, new nodal mets at L thoracic inlet. stable 6 mm nodule in posterior RLL, unchanged since 2015   06/22/2015 PET scan   Resolution of metabolic activity and decreased size of mild LAD at L thoracic inlet, stable mild mid abd hypermetabolic mesenteric LAD, stable 6 mm posterior LLL pulm nodule without metabolic activity   12/22/2015 Imaging   MRI lumbar spine L4-L5 disc degenerated, broad based disc hernation, B facet arthropathy with hypertophy and edema, stenosis of lateral recesses that could cause neural compression   10/18/2016 Imaging   CT CAP- 1. Several small ground-glass pulmonary nodules are present in the lungs and are stable over the past 9 months, but several are mildly larger than they were in 2015. Low-grade adenocarcinoma can sometimes have this appearance and surveillance is likely warranted. 2. There is a cluster of nodal tissue in the central mesentery, maximum short axis diameter 1.3 cm. This nodal cluster is relatively low in density and not appreciably changed from the prior  exam. Given the lack of change is may represent quiescent residua of malignancy, but again, surveillance is likely warranted. 3. The left-sided rib fractures are more numerous than was revealed on the prior radiographs ; there are nondisplaced healing fractures of the left fifth, sixth, seventh, eighth, ninth, and tenth ribs. 4. Mild cardiomegaly although part of this appearance may be due to pectus excavatum. 5. Several additional small pulmonary nodules are stable from 2015 and considered benign. 6. Right hemicolectomy and sigmoid colon diverticulosis. 7. Small right paraumbilical hernia containing adipose tissue. 8. Lower lumbar spondylosis and degenerative disc disease potentially with mild impingement at L4-5.   04/25/2017 Imaging   CT C/A/P: Scattered solid pulmonary nodules measure up to 6 mm in the right lower lobe, unchanged. There are a few scattered ground-glass nodules measuring up to 8 mm in the right upper lobe, also stable. Distal perigastric lymph nodes are stable. No additional evidence of metastatic disease.   Metastatic colon cancer to liver (HCC)  11/04/2020 Initial Diagnosis   Metastatic colon cancer to liver (HCC)   11/10/2020 - 12/10/2020 Chemotherapy         01/13/2021 - 03/02/2022 Chemotherapy   Patient is on Treatment Plan : COLORECTAL Pembrolizumab q21d     01/13/2021 -  Chemotherapy   Patient is on Treatment Plan : COLORECTAL Pembrolizumab (200) q21d        INTERVAL HISTORY:   Amanda Norris is a 77 y.o. Norris presenting to clinic today for follow up of metastatic transverse colon adenocarcinoma. She was last seen by me on 09/07/22.  Since her last visit, she underwent restaging CT C/A/P on 11/02/22 showing stable to slightly improved metastatic disease.  Today, she states that she is doing well overall. Her appetite level is at 80%. Her energy level is at 10%.  PAST MEDICAL HISTORY:   Past Medical History: Past Medical History:  Diagnosis Date   Arthritis     wrist and thumbs   Colon cancer (HCC)    PONV (postoperative nausea and vomiting)    Port-A-Cath in place 11/09/2020   Ruptured lumbar disc    L1-L5 per patient report   Sciatica of left side     Surgical History: Past Surgical History:  Procedure Laterality Date   COLONOSCOPY N/A 12/12/2013   Procedure: COLONOSCOPY;  Surgeon: Malissa Hippo, MD;  Location: [facility];  Service: Endoscopy;  Laterality: N/A;  730   COLONOSCOPY N/A 01/20/2015   Procedure: COLONOSCOPY;  Surgeon: Malissa Hippo, MD;  Location: [facility];  Service: Endoscopy;  Laterality: N/A;  1030   COLONOSCOPY N/A 03/28/2018   Procedure: COLONOSCOPY;  Surgeon: Malissa Hippo, MD;  Location: [facility];  Service: Endoscopy;  Laterality: N/A;  1015   EUS N/A 12/18/2013   Procedure: UPPER ENDOSCOPIC ULTRASOUND (EUS) LINEAR;  Surgeon: Rachael Fee, MD;  Location: [facility];  Service: Endoscopy;  Laterality: N/A;   herniated disc     1996-[id number]   IR IMAGING GUIDED PORT INSERTION  10/14/2020   IR US GUIDANCE  10/14/2020   PARTIAL COLECTOMY N/A 12/29/2013   Procedure: RIGHT HEMICOLECTOMY;  Surgeon: Dalia Heading, MD;  Location: [facility];  Service: General;  Laterality: N/A;   POLYPECTOMY  03/28/2018   Procedure: POLYPECTOMY;  Surgeon: Malissa Hippo, MD;  Location: [facility];  Service: Endoscopy;;  transverse colon (hot snare);   PORT-A-CATH REMOVAL Right 04/01/2014   Procedure: MINOR REMOVAL PORT-A-CATH;  Surgeon: Dalia Heading, MD;  Location: [facility];  Service: General;  Laterality: Right;   PORTACATH PLACEMENT Right 02/04/2014   Procedure: INSERTION PORT-A-CATH;  Surgeon: Dalia Heading, MD;  Location: [facility];  Service: General;  Laterality: Right;    Social History: Social History   Socioeconomic History   Marital status: Married    Spouse name: Not on file   Number of children: Not on file   Years of education: Not on file   Highest education level: Not on file  Occupational History   Not on file   Tobacco Use   Smoking status: Former    Packs/day: 0.25    Years: 5.00    Additional pack years: 0.00    Total pack years: 1.25    Types: Cigarettes    Quit date: 04/19/1959    Years since quitting: 63.6   Smokeless tobacco: Never   Tobacco comments:    smoked for 5 years as teenager  Vaping Use   Vaping Use: Never used  Substance and Sexual Activity   Alcohol use: No   Drug use: No   Sexual activity: Yes    Birth control/protection: Post-menopausal  Other Topics Concern   Not on file  Social History Narrative   Not on file   Social Determinants of Health   Financial Resource Strain: Low Risk  (10/04/2020)   Overall Financial Resource Strain (CARDIA)    Difficulty of Paying Living Expenses: Not hard at all  Food Insecurity: No Food Insecurity (10/04/2020)   Hunger Vital Sign    Worried About Running Out of Food in the Last Year: Never true    Ran Out of Food in the Last Year: Never true  Transportation Needs: No Transportation Needs (10/04/2020)   PRAPARE - Administrator, Civil Service (Medical): No    Lack of Transportation (Non-Medical): No  Physical Activity: Inactive (10/04/2020)   Exercise Vital Sign    Days of Exercise per Week: 0 days    Minutes of Exercise per Session: 0 min  Stress: Stress Concern Present (10/04/2020)   [employer] of Occupational Health - Occupational Stress Questionnaire    Feeling of Stress : To some extent  Social Connections: Socially Integrated (10/04/2020)   Social Connection and Isolation Panel [NHANES]    Frequency of Communication with Friends and Family: More than three times a week    Frequency of Social Gatherings with Friends and Family: More than three times a week    Attends Religious Services: More than 4 times per year  Active Member of Clubs or Organizations: Yes    Attends Banker Meetings: More than 4 times per year    Marital Status: Married  Catering manager Violence: Not At Risk  (10/04/2020)   Humiliation, Afraid, Rape, and Kick questionnaire    Fear of Current or Ex-Partner: No    Emotionally Abused: No    Physically Abused: No    Sexually Abused: No    Family History: Family History  Problem Relation Age of Onset   Diabetes type II Mother    Dementia Father     Current Medications:  Current Outpatient Medications:    acetaminophen (TYLENOL) 500 MG tablet, Take 500 mg by mouth every 6 (six) hours as needed for moderate pain or headache., Disp: , Rfl:    aspirin 81 MG EC tablet, Take 1 tablet (81 mg total) by mouth daily. Swallow whole., Disp: 30 tablet, Rfl: 12   Bioflavonoid Products (ESTER C PO), Take 1,000 mg by mouth daily., Disp: , Rfl:    Cholecalciferol (D3-1000) 25 MCG (1000 UT) capsule, Take 125 Units by mouth daily., Disp: , Rfl:    Cranberry-Vitamin C-Vitamin E 4200-20-3 MG-MG-UNIT CAPS, Take 500 mg by mouth daily., Disp: , Rfl:    Garlic 1000 MG CAPS, Take 1,000 mg by mouth daily. , Disp: , Rfl:    lidocaine-prilocaine (EMLA) cream, , Disp: , Rfl:    Multiple Vitamin (MULTIVITAMIN) tablet, Take 1 tablet by mouth daily., Disp: , Rfl:    Omega-3 Fatty Acids (FISH OIL) 1200 MG CAPS, Take 1,000 mg by mouth daily., Disp: , Rfl:    OVER THE COUNTER MEDICATION, Take 1,000 mg by mouth daily. Moringa 1000 mg daily, Disp: , Rfl:    OVER THE COUNTER MEDICATION, Take 1,000 mg by mouth daily. L-Lysine 1000mg  po daily, Disp: , Rfl:    pramoxine-hydrocortisone (PROCTOCREAM-HC) 1-1 % rectal cream, Place 1 application rectally 2 (two) times daily., Disp: 30 g, Rfl: 0   zinc gluconate 50 MG tablet, Take 50 mg by mouth daily., Disp: , Rfl:  No current facility-administered medications for this visit.  Facility-Administered Medications Ordered in Other Visits:    sodium chloride flush (NS) 0.9 % injection 10 mL, 10 mL, Intracatheter, PRN, Doreatha Massed, MD, 10 mL at 11/09/22 1458   Allergies: Allergies  Allergen Reactions   Sulfa Antibiotics Other (See  Comments)    "Sores in my mouth"    REVIEW OF SYSTEMS:   Review of Systems  Constitutional:  Negative for chills, fatigue and fever.  HENT:   Negative for lump/mass, mouth sores, nosebleeds, sore throat and trouble swallowing.   Eyes:  Negative for eye problems.  Respiratory:  Positive for cough. Negative for shortness of breath.   Cardiovascular:  Negative for chest pain, leg swelling and palpitations.  Gastrointestinal:  Positive for diarrhea. Negative for abdominal pain, constipation, nausea and vomiting.  Genitourinary:  Negative for bladder incontinence, difficulty urinating, dysuria, frequency, hematuria and nocturia.   Musculoskeletal:  Negative for arthralgias, back pain, flank pain, myalgias and neck pain.  Skin:  Negative for itching and rash.  Neurological:  Negative for dizziness, headaches and numbness.  Hematological:  Does not bruise/bleed easily.  Psychiatric/Behavioral:  Negative for depression, sleep disturbance and suicidal ideas. The patient is not nervous/anxious.   All other systems reviewed and are negative.    VITALS:   Blood pressure (!) 146/65, pulse 71, temperature 98.5 F (36.9 C), temperature source Oral, resp. rate 16, height 5\' 3"  (1.6 m), weight 160 lb  9.6 oz (72.8 kg), SpO2 99 %.  Wt Readings from Last 3 Encounters:  11/09/22 160 lb 9.6 oz (72.8 kg)  09/28/22 160 lb 6.4 oz (72.8 kg)  09/07/22 159 lb 4.8 oz (72.3 kg)    Body mass index is 28.45 kg/m.  Performance status (ECOG): 1 - Symptomatic but completely ambulatory  PHYSICAL EXAM:   Physical Exam Vitals and nursing note reviewed. Exam conducted with a chaperone present.  Constitutional:      Appearance: Normal appearance.  Cardiovascular:     Rate and Rhythm: Normal rate and regular rhythm.     Pulses: Normal pulses.     Heart sounds: Normal heart sounds.  Pulmonary:     Effort: Pulmonary effort is normal.     Breath sounds: Normal breath sounds.  Abdominal:     Palpations: Abdomen  is soft. There is no hepatomegaly, splenomegaly or mass.     Tenderness: There is no abdominal tenderness.  Musculoskeletal:     Right lower leg: No edema.     Left lower leg: No edema.  Lymphadenopathy:     Cervical: No cervical adenopathy.     Right cervical: No superficial, deep or posterior cervical adenopathy.    Left cervical: No superficial, deep or posterior cervical adenopathy.     Upper Body:     Right upper body: No supraclavicular or axillary adenopathy.     Left upper body: No supraclavicular or axillary adenopathy.  Neurological:     General: No focal deficit present.     Mental Status: She is alert and oriented to person, place, and time.  Psychiatric:        Mood and Affect: Mood normal.        Behavior: Behavior normal.     LABS:      Latest Ref Rng & Units 11/09/2022   11:04 AM 10/19/2022   12:21 PM 09/28/2022   12:41 PM  CBC  WBC 4.0 - 10.5 K/uL 7.1  7.1  6.4   Hemoglobin 12.0 - 15.0 g/dL 81.1  91.4  78.2   Hematocrit 36.0 - 46.0 % 41.5  39.0  42.2   Platelets 150 - 400 K/uL 232  201  189       Latest Ref Rng & Units 11/09/2022   11:04 AM 10/19/2022   12:21 PM 09/28/2022   12:41 PM  CMP  Glucose 70 - 99 mg/dL 94  92  98   BUN 8 - 23 mg/dL 11  14  12    Creatinine 0.44 - 1.00 mg/dL 9.56  2.13  0.86   Sodium 135 - 145 mmol/L 137  134  137   Potassium 3.5 - 5.1 mmol/L 4.9  4.3  3.9   Chloride 98 - 111 mmol/L 102  103  101   CO2 22 - 32 mmol/L 26  24  25    Calcium 8.9 - 10.3 mg/dL 9.5  9.1  9.1   Total Protein 6.5 - 8.1 g/dL 8.3  7.6  8.2   Total Bilirubin 0.3 - 1.2 mg/dL 0.5  0.4  0.7   Alkaline Phos 38 - 126 U/L 70  63  72   AST 15 - 41 U/L 26  24  25    ALT 0 - 44 U/L 22  20  21       Lab Results  Component Value Date   CEA1 5.5 (H) 10/19/2022   CEA 7.7 (H) 08/26/2020   /  CEA  Date Value Ref Range Status  10/19/2022  5.5 (H) 0.0 - 4.7 ng/mL Final    Comment:    (NOTE)                             Nonsmokers          <3.9                              Smokers             <5.6 Roche [employer] Electrochemiluminescence Immunoassay (ECLIA) Values obtained with different assay methods or kits cannot be used interchangeably.  Results cannot be interpreted as absolute evidence of the presence or absence of malignant disease. Performed At: [facility] [address] [place], [place] [id number] Jolene Schimke MD [id number]   08/26/2020 7.7 (H) ng/mL Final    Comment:    Non-Smoker: <2.5 Smoker:     <5.0 . . This test was performed using the Siemens  chemiluminescent method. Values obtained from different assay methods cannot be used interchangeably. CEA levels, regardless of value, should not be interpreted as absolute evidence of the presence or absence of disease. .    No results found for: "PSA1" No results found for: "BMW413" No results found for: "CAN125"  No results found for: "TOTALPROTELP", "ALBUMINELP", "A1GS", "A2GS", "BETS", "BETA2SER", "GAMS", "MSPIKE", "SPEI" Lab Results  Component Value Date   FERRITIN 119 10/28/2018   FERRITIN 63 02/17/2014   Lab Results  Component Value Date   LDH 170 10/28/2018   LDH 153 04/25/2018     STUDIES:   CT CHEST ABDOMEN PELVIS W CONTRAST  Result Date: 11/06/2022 CLINICAL DATA:  Metastatic disease evaluation, adenocarcinoma of the transverse colon, status post right hemicolectomy and chemotherapy, ongoing immunotherapy * Tracking Code: BO * EXAM: CT CHEST, ABDOMEN, AND PELVIS WITH CONTRAST TECHNIQUE: Multidetector CT imaging of the chest, abdomen and pelvis was performed following the standard protocol during bolus administration of intravenous contrast. RADIATION DOSE REDUCTION: This exam was performed according to the departmental dose-optimization program which includes automated exposure control, adjustment of the mA and/or kV according to patient size and/or use of iterative reconstruction technique. CONTRAST:  OMNIPAQUE IOHEXOL 300 MG/ML SOLN, additional  oral enteric contrast COMPARISON:  05/18/2022 FINDINGS: CT CHEST FINDINGS Cardiovascular: Right chest port catheter. Normal heart size. No pericardial effusion. Mediastinum/Nodes: Stable or slightly diminished supraclavicular and mediastinal lymph nodes. Left supraclavicular node measures 3.4 x 1.8 cm (series 2, image 12). Index pretracheal node measures 1.7 x 1.7 cm (series 2, image 27). Right periaortic node measures 2.4 x 1.7 cm, previously 2.7 x 1.9 cm (series 2, image 66). Thyroid gland, trachea, and esophagus demonstrate no significant findings. Lungs/Pleura: Numerous bilateral pulmonary nodules are stable or minimally diminished in size, index nodule of the right lower lobe measuring 0.9 x 0.9 cm, previously 1.1 x 0.9 cm (series 3, image 92). No new nodules. No pleural effusion or pneumothorax. Musculoskeletal: Pectus deformity.  No acute osseous findings. CT ABDOMEN PELVIS FINDINGS Hepatobiliary: No solid liver abnormality is seen. No gallstones, gallbladder wall thickening, or biliary dilatation. Pancreas: Unremarkable. No pancreatic ductal dilatation or surrounding inflammatory changes. Spleen: Normal in size without significant abnormality. Adrenals/Urinary Tract: Adrenal glands are unremarkable. Kidneys are normal, without renal calculi, solid lesion, or hydronephrosis. Bladder is unremarkable. Stomach/Bowel: Stomach is within normal limits. Status post right hemicolectomy and ileocolic anastomosis. No evidence of bowel wall thickening, distention, or inflammatory changes. Vascular/Lymphatic: No  significant vascular findings are present. Unchanged partially calcified soft tissue mass or lymph node conglomerate in the central small bowel mesentery, encasing the proximal superior mesenteric artery, measuring 5.6 x 4.1 cm (series 2, image 78). Unchanged or slightly diminished retroperitoneal lymphadenopathy, index left retroperitoneal node measuring 2.3 x 1.5 cm, previously 2.8 x 1.8 cm (series 2, image  73). Reproductive: No mass. Unchanged prominent bilateral ovarian and adnexal varices, with enlargement of the left ovarian vein, measuring up to 1.0 cm near the confluence with the left renal vein. Other: No abdominal wall hernia or abnormality. No ascites. Musculoskeletal: No acute osseous findings. IMPRESSION: 1. Status post right hemicolectomy and ileocolic anastomosis. 2. Numerous bilateral pulmonary nodules are stable or minimally diminished in size. No new nodules. 3. Stable or slightly diminished supraclavicular, mediastinal, and retroperitoneal lymphadenopathy. 4. Unchanged partially calcified soft tissue mass or lymph node conglomerate in the central small bowel mesentery. 5. Constellation of findings is consistent with stable or slightly improved metastatic disease. 6. Unchanged prominent bilateral ovarian and adnexal varices, with enlargement of the left ovarian vein, as can be seen in pelvic congestion if referable signs symptoms are present. Electronically Signed   By: Jearld Lesch M.D.   On: 11/06/2022 22:43

## 2022-11-09 ENCOUNTER — Inpatient Hospital Stay: Payer: Medicare Other

## 2022-11-09 ENCOUNTER — Inpatient Hospital Stay: Payer: Medicare Other | Attending: Hematology | Admitting: Hematology

## 2022-11-09 VITALS — BP 146/65 | HR 71 | Temp 98.5°F | Resp 16 | Ht 63.0 in | Wt 160.6 lb

## 2022-11-09 VITALS — BP 121/66 | HR 58 | Temp 98.0°F | Resp 18

## 2022-11-09 DIAGNOSIS — Z79899 Other long term (current) drug therapy: Secondary | ICD-10-CM | POA: Diagnosis not present

## 2022-11-09 DIAGNOSIS — C184 Malignant neoplasm of transverse colon: Secondary | ICD-10-CM | POA: Insufficient documentation

## 2022-11-09 DIAGNOSIS — Z5111 Encounter for antineoplastic chemotherapy: Secondary | ICD-10-CM | POA: Insufficient documentation

## 2022-11-09 DIAGNOSIS — Z95828 Presence of other vascular implants and grafts: Secondary | ICD-10-CM

## 2022-11-09 DIAGNOSIS — C787 Secondary malignant neoplasm of liver and intrahepatic bile duct: Secondary | ICD-10-CM

## 2022-11-09 DIAGNOSIS — C189 Malignant neoplasm of colon, unspecified: Secondary | ICD-10-CM

## 2022-11-09 DIAGNOSIS — Z5112 Encounter for antineoplastic immunotherapy: Secondary | ICD-10-CM | POA: Diagnosis present

## 2022-11-09 LAB — CBC WITH DIFFERENTIAL/PLATELET
Abs Immature Granulocytes: 0.04 10*3/uL (ref 0.00–0.07)
Basophils Absolute: 0.1 10*3/uL (ref 0.0–0.1)
Basophils Relative: 1 %
Eosinophils Absolute: 0.1 10*3/uL (ref 0.0–0.5)
Eosinophils Relative: 2 %
HCT: 41.5 % (ref 36.0–46.0)
Hemoglobin: 13.3 g/dL (ref 12.0–15.0)
Immature Granulocytes: 1 %
Lymphocytes Relative: 20 %
Lymphs Abs: 1.4 10*3/uL (ref 0.7–4.0)
MCH: 29.6 pg (ref 26.0–34.0)
MCHC: 32 g/dL (ref 30.0–36.0)
MCV: 92.4 fL (ref 80.0–100.0)
Monocytes Absolute: 0.8 10*3/uL (ref 0.1–1.0)
Monocytes Relative: 11 %
Neutro Abs: 4.8 10*3/uL (ref 1.7–7.7)
Neutrophils Relative %: 65 %
Platelets: 232 10*3/uL (ref 150–400)
RBC: 4.49 MIL/uL (ref 3.87–5.11)
RDW: 13.1 % (ref 11.5–15.5)
WBC: 7.1 10*3/uL (ref 4.0–10.5)
nRBC: 0 % (ref 0.0–0.2)

## 2022-11-09 LAB — COMPREHENSIVE METABOLIC PANEL
ALT: 22 U/L (ref 0–44)
AST: 26 U/L (ref 15–41)
Albumin: 4.3 g/dL (ref 3.5–5.0)
Alkaline Phosphatase: 70 U/L (ref 38–126)
Anion gap: 9 (ref 5–15)
BUN: 11 mg/dL (ref 8–23)
CO2: 26 mmol/L (ref 22–32)
Calcium: 9.5 mg/dL (ref 8.9–10.3)
Chloride: 102 mmol/L (ref 98–111)
Creatinine, Ser: 0.67 mg/dL (ref 0.44–1.00)
GFR, Estimated: >60 mL/min (ref >60)
Glucose, Bld: 94 mg/dL (ref 70–99)
Potassium: 4.9 mmol/L (ref 3.5–5.1)
Sodium: 137 mmol/L (ref 135–145)
Total Bilirubin: 0.5 mg/dL (ref 0.3–1.2)
Total Protein: 8.3 g/dL — ABNORMAL HIGH (ref 6.5–8.1)

## 2022-11-09 LAB — MAGNESIUM: Magnesium: 2.2 mg/dL (ref 1.7–2.4)

## 2022-11-09 MED ORDER — SODIUM CHLORIDE 0.9 % IV SOLN
200.0000 mg | Freq: Once | INTRAVENOUS | Status: AC
  Administered 2022-11-09: 200 mg via INTRAVENOUS
  Filled 2022-11-09: qty 8

## 2022-11-09 MED ORDER — SODIUM CHLORIDE 0.9% FLUSH
10.0000 mL | INTRAVENOUS | Status: DC | PRN
  Administered 2022-11-09: 10 mL

## 2022-11-09 MED ORDER — SODIUM CHLORIDE 0.9 % IV SOLN: Freq: Once | INTRAVENOUS | Status: AC

## 2022-11-09 MED ORDER — HEPARIN SOD (PORK) LOCK FLUSH 100 UNIT/ML IV SOLN
500.0000 [IU] | Freq: Once | INTRAVENOUS | Status: AC | PRN
  Administered 2022-11-09: 500 [IU]

## 2022-11-09 NOTE — Progress Notes (Signed)
Patient has been examined by Dr. Katragadda. Vital signs and labs have been reviewed by MD - ANC, Creatinine, LFTs, hemoglobin, and platelets are within treatment parameters per M.D. - pt may proceed with treatment.  Primary RN and pharmacy notified.  

## 2022-11-09 NOTE — Progress Notes (Signed)
Patient presents today for Keytruda infusion. Patient is in satisfactory condition with no new complaints voiced.  Vital signs are stable.  Labs reviewed by Dr. Katragadda during the office visit and all labs are within treatment parameters.  We will proceed with treatment per MD orders.   Keytruda given today per MD orders. Tolerated infusion without adverse affects. Vital signs stable. No complaints at this time. Discharged from clinic ambulatory in stable condition. Alert and oriented x 3. F/U with [facility] as scheduled.   

## 2022-11-09 NOTE — Patient Instructions (Signed)
[facility] at [facility] Discharge Instructions   You were seen and examined today by Dr. Ellin Saba.  He reviewed the results of your CT scan which is stable. There is no new evidence of cancer seen on this exam.   He reviewed the results of your lab work which are normal/stable.   We will proceed with your treatment today.   Return as scheduled.    Thank you for choosing [facility] at [facility] to provide your oncology and hematology care.  To afford each patient quality time with our provider, please arrive at least 15 minutes before your scheduled appointment time.   If you have a lab appointment with the Cancer Center please come in thru the Main Entrance and check in at the main information desk.  You need to re-schedule your appointment should you arrive 10 or more minutes late.  We strive to give you quality time with our providers, and arriving late affects you and other patients whose appointments are after yours.  Also, if you no show three or more times for appointments you may be dismissed from the clinic at the providers discretion.     Again, thank you for choosing [facility].  Our hope is that these requests will decrease the amount of time that you wait before being seen by our physicians.       _____________________________________________________________  Should you have questions after your visit to [facility], please contact our office at [phone] and follow the prompts.  Our office hours are 8:00 a.m. and 4:30 p.m. Monday - Friday.  Please note that voicemails left after 4:00 p.m. may not be returned until the following business day.  We are closed weekends and major holidays.  You do have access to a nurse 24-7, just call the main number to the clinic [phone] and do not press any options, hold on the line and a nurse will answer the phone.    For prescription refill requests, have  your pharmacy contact our office and allow 72 hours.    Due to Covid, you will need to wear a mask upon entering the hospital. If you do not have a mask, a mask will be given to you at the Main Entrance upon arrival. For doctor visits, patients may have 1 support person age 93 or older with them. For treatment visits, patients can not have anyone with them due to social distancing guidelines and our immunocompromised population.

## 2022-11-09 NOTE — Patient Instructions (Signed)
[facility]  Discharge Instructions: Thank you for choosing [facility] to provide your oncology and hematology care.  If you have a lab appointment with the Cancer Center - please note that after April 8th, 2024, all labs will be drawn in the cancer center.  You do not have to check in or register with the main entrance as you have in the past but will complete your check-in in the cancer center.  Wear comfortable clothing and clothing appropriate for easy access to any Portacath or PICC line.   We strive to give you quality time with your provider. You may need to reschedule your appointment if you arrive late (15 or more minutes).  Arriving late affects you and other patients whose appointments are after yours.  Also, if you miss three or more appointments without notifying the office, you may be dismissed from the clinic at the provider's discretion.      For prescription refill requests, have your pharmacy contact our office and allow 72 hours for refills to be completed.    Today you received the following chemotherapy and/or immunotherapy agents Keytruda   To help prevent nausea and vomiting after your treatment, we encourage you to take your nausea medication as directed.  Pembrolizumab Injection What is this medication? PEMBROLIZUMAB (PEM broe LIZ ue mab) treats some types of cancer. It works by helping your immune system slow or stop the spread of cancer cells. It is a monoclonal antibody. This medicine may be used for other purposes; ask your health care provider or pharmacist if you have questions. COMMON BRAND NAME(S): Keytruda What should I tell my care team before I take this medication? They need to know if you have any of these conditions: Allogeneic stem cell transplant (uses someone else's stem cells) Autoimmune diseases, such as Crohn disease, ulcerative colitis, lupus History of chest radiation Nervous system problems, such as  Guillain-Barre syndrome, myasthenia gravis Organ transplant An unusual or allergic reaction to pembrolizumab, other medications, foods, dyes, or preservatives Pregnant or trying to get pregnant Breast-feeding How should I use this medication? This medication is injected into a vein. It is given by your care team in a hospital or clinic setting. A special MedGuide will be given to you before each treatment. Be sure to read this information carefully each time. Talk to your care team about the use of this medication in children. While it may be prescribed for children as young as 6 months for selected conditions, precautions do apply. Overdosage: If you think you have taken too much of this medicine contact a poison control center or emergency room at once. NOTE: This medicine is only for you. Do not share this medicine with others. What if I miss a dose? Keep appointments for follow-up doses. It is important not to miss your dose. Call your care team if you are unable to keep an appointment. What may interact with this medication? Interactions have not been studied. This list may not describe all possible interactions. Give your health care provider a list of all the medicines, herbs, non-prescription drugs, or dietary supplements you use. Also tell them if you smoke, drink alcohol, or use illegal drugs. Some items may interact with your medicine. What should I watch for while using this medication? Your condition will be monitored carefully while you are receiving this medication. You may need blood work while taking this medication. This medication may cause serious skin reactions. They can happen weeks to months   after starting the medication. Contact your care team right away if you notice fevers or flu-like symptoms with a rash. The rash may be red or purple and then turn into blisters or peeling of the skin. You may also notice a red rash with swelling of the face, lips, or lymph nodes in your  neck or under your arms. Tell your care team right away if you have any change in your eyesight. Talk to your care team if you may be pregnant. Serious birth defects can occur if you take this medication during pregnancy and for 4 months after the last dose. You will need a negative pregnancy test before starting this medication. Contraception is recommended while taking this medication and for 4 months after the last dose. Your care team can help you find the option that works for you. Do not breastfeed while taking this medication and for 4 months after the last dose. What side effects may I notice from receiving this medication? Side effects that you should report to your care team as soon as possible: Allergic reactions--skin rash, itching, hives, swelling of the face, lips, tongue, or throat Dry cough, shortness of breath or trouble breathing Eye pain, redness, irritation, or discharge with blurry or decreased vision Heart muscle inflammation--unusual weakness or fatigue, shortness of breath, chest pain, fast or irregular heartbeat, dizziness, swelling of the ankles, feet, or hands Hormone gland problems--headache, sensitivity to light, unusual weakness or fatigue, dizziness, fast or irregular heartbeat, increased sensitivity to cold or heat, excessive sweating, constipation, hair loss, increased thirst or amount of urine, tremors or shaking, irritability Infusion reactions--chest pain, shortness of breath or trouble breathing, feeling faint or lightheaded Kidney injury (glomerulonephritis)--decrease in the amount of urine, red or dark brown urine, foamy or bubbly urine, swelling of the ankles, hands, or feet Liver injury--right upper belly pain, loss of appetite, nausea, light-colored stool, dark yellow or brown urine, yellowing skin or eyes, unusual weakness or fatigue Pain, tingling, or numbness in the hands or feet, muscle weakness, change in vision, confusion or trouble speaking, loss of  balance or coordination, trouble walking, seizures Rash, fever, and swollen lymph nodes Redness, blistering, peeling, or loosening of the skin, including inside the mouth Sudden or severe stomach pain, bloody diarrhea, fever, nausea, vomiting Side effects that usually do not require medical attention (report to your care team if they continue or are bothersome): Bone, joint, or muscle pain Diarrhea Fatigue Loss of appetite Nausea Skin rash This list may not describe all possible side effects. Call your doctor for medical advice about side effects. You may report side effects to FDA at 1-800-FDA-1088. Where should I keep my medication? This medication is given in a hospital or clinic. It will not be stored at home. NOTE: This sheet is a summary. It may not cover all possible information. If you have questions about this medicine, talk to your doctor, pharmacist, or health care provider.  2023 Elsevier/Gold Standard (2021-11-08 00:00:00)   BELOW ARE SYMPTOMS THAT SHOULD BE REPORTED IMMEDIATELY: *FEVER GREATER THAN 100.4 F (38 C) OR HIGHER *CHILLS OR SWEATING *NAUSEA AND VOMITING THAT IS NOT CONTROLLED WITH YOUR NAUSEA MEDICATION *UNUSUAL SHORTNESS OF BREATH *UNUSUAL BRUISING OR BLEEDING *URINARY PROBLEMS (pain or burning when urinating, or frequent urination) *BOWEL PROBLEMS (unusual diarrhea, constipation, pain near the anus) TENDERNESS IN MOUTH AND THROAT WITH OR WITHOUT PRESENCE OF ULCERS (sore throat, sores in mouth, or a toothache) UNUSUAL RASH, SWELLING OR PAIN  UNUSUAL VAGINAL DISCHARGE OR ITCHING   Items   with * indicate a potential emergency and should be followed up as soon as possible or go to the Emergency Department if any problems should occur.  Please show the CHEMOTHERAPY ALERT CARD or IMMUNOTHERAPY ALERT CARD at check-in to the Emergency Department and triage nurse.  Should you have questions after your visit or need to cancel or reschedule your appointment, please  contact [facility] [phone]  and follow the prompts.  Office hours are 8:00 a.m. to 4:30 p.m. Monday - Friday. Please note that voicemails left after 4:00 p.m. may not be returned until the following business day.  We are closed weekends and major holidays. You have access to a nurse at all times for urgent questions. Please call the main number to the clinic [phone] and follow the prompts.  For any non-urgent questions, you may also contact your provider using MyChart. We now offer e-Visits for anyone 18 and older to request care online for non-urgent symptoms. For details visit [email or web address].   Also download the MyChart app! Go to the app store, search "MyChart", open the app, select [facility], and log in with your MyChart username and password.  

## 2022-11-10 ENCOUNTER — Other Ambulatory Visit: Payer: Self-pay

## 2022-11-28 ENCOUNTER — Other Ambulatory Visit: Payer: Medicare Other

## 2022-11-30 ENCOUNTER — Other Ambulatory Visit: Payer: Medicare Other

## 2022-11-30 ENCOUNTER — Inpatient Hospital Stay: Payer: Medicare Other

## 2022-11-30 ENCOUNTER — Ambulatory Visit: Payer: Medicare Other | Admitting: Hematology

## 2022-11-30 VITALS — BP 133/72 | HR 69 | Temp 97.5°F | Resp 16 | Wt 162.0 lb

## 2022-11-30 DIAGNOSIS — Z5111 Encounter for antineoplastic chemotherapy: Secondary | ICD-10-CM | POA: Diagnosis not present

## 2022-11-30 DIAGNOSIS — Z95828 Presence of other vascular implants and grafts: Secondary | ICD-10-CM

## 2022-11-30 DIAGNOSIS — C787 Secondary malignant neoplasm of liver and intrahepatic bile duct: Secondary | ICD-10-CM

## 2022-11-30 DIAGNOSIS — C184 Malignant neoplasm of transverse colon: Secondary | ICD-10-CM | POA: Diagnosis not present

## 2022-11-30 DIAGNOSIS — Z79899 Other long term (current) drug therapy: Secondary | ICD-10-CM | POA: Diagnosis not present

## 2022-11-30 LAB — CBC WITH DIFFERENTIAL/PLATELET
Abs Immature Granulocytes: 0.02 10*3/uL (ref 0.00–0.07)
Basophils Absolute: 0.1 10*3/uL (ref 0.0–0.1)
Basophils Relative: 1 %
Eosinophils Absolute: 0.2 10*3/uL (ref 0.0–0.5)
Eosinophils Relative: 2 %
HCT: 40.2 % (ref 36.0–46.0)
Hemoglobin: 12.9 g/dL (ref 12.0–15.0)
Immature Granulocytes: 0 %
Lymphocytes Relative: 22 %
Lymphs Abs: 1.4 10*3/uL (ref 0.7–4.0)
MCH: 29.9 pg (ref 26.0–34.0)
MCHC: 32.1 g/dL (ref 30.0–36.0)
MCV: 93.3 fL (ref 80.0–100.0)
Monocytes Absolute: 0.7 10*3/uL (ref 0.1–1.0)
Monocytes Relative: 10 %
Neutro Abs: 4.4 10*3/uL (ref 1.7–7.7)
Neutrophils Relative %: 65 %
Platelets: 171 10*3/uL (ref 150–400)
RBC: 4.31 MIL/uL (ref 3.87–5.11)
RDW: 13.2 % (ref 11.5–15.5)
WBC: 6.7 10*3/uL (ref 4.0–10.5)
nRBC: 0 % (ref 0.0–0.2)

## 2022-11-30 LAB — COMPREHENSIVE METABOLIC PANEL
ALT: 21 U/L (ref 0–44)
AST: 25 U/L (ref 15–41)
Albumin: 4.3 g/dL (ref 3.5–5.0)
Alkaline Phosphatase: 65 U/L (ref 38–126)
Anion gap: 10 (ref 5–15)
BUN: 13 mg/dL (ref 8–23)
CO2: 24 mmol/L (ref 22–32)
Calcium: 9.1 mg/dL (ref 8.9–10.3)
Chloride: 102 mmol/L (ref 98–111)
Creatinine, Ser: 0.65 mg/dL (ref 0.44–1.00)
GFR, Estimated: >60 mL/min (ref >60)
Glucose, Bld: 90 mg/dL (ref 70–99)
Potassium: 4.2 mmol/L (ref 3.5–5.1)
Sodium: 136 mmol/L (ref 135–145)
Total Bilirubin: 1 mg/dL (ref 0.3–1.2)
Total Protein: 7.9 g/dL (ref 6.5–8.1)

## 2022-11-30 LAB — MAGNESIUM: Magnesium: 2.2 mg/dL (ref 1.7–2.4)

## 2022-11-30 MED ORDER — HEPARIN SOD (PORK) LOCK FLUSH 100 UNIT/ML IV SOLN
500.0000 [IU] | Freq: Once | INTRAVENOUS | Status: AC | PRN
  Administered 2022-11-30: 500 [IU]

## 2022-11-30 MED ORDER — SODIUM CHLORIDE 0.9 % IV SOLN
200.0000 mg | Freq: Once | INTRAVENOUS | Status: AC
  Administered 2022-11-30: 200 mg via INTRAVENOUS
  Filled 2022-11-30: qty 8

## 2022-11-30 MED ORDER — SODIUM CHLORIDE 0.9% FLUSH
10.0000 mL | INTRAVENOUS | Status: DC | PRN
  Administered 2022-11-30: 10 mL

## 2022-11-30 MED ORDER — SODIUM CHLORIDE 0.9 % IV SOLN: Freq: Once | INTRAVENOUS | Status: AC

## 2022-12-07 ENCOUNTER — Other Ambulatory Visit: Payer: Self-pay

## 2022-12-07 ENCOUNTER — Ambulatory Visit (HOSPITAL_COMMUNITY)
Admission: RE | Admit: 2022-12-07 | Discharge: 2022-12-07 | Disposition: A | Discharge status: Home / Self Care | Payer: Medicare Other | Source: Ambulatory Visit | Attending: Hematology | Admitting: Hematology

## 2022-12-07 DIAGNOSIS — C189 Malignant neoplasm of colon, unspecified: Secondary | ICD-10-CM | POA: Diagnosis not present

## 2022-12-07 DIAGNOSIS — C787 Secondary malignant neoplasm of liver and intrahepatic bile duct: Secondary | ICD-10-CM | POA: Insufficient documentation

## 2022-12-07 DIAGNOSIS — G319 Degenerative disease of nervous system, unspecified: Secondary | ICD-10-CM | POA: Diagnosis not present

## 2022-12-07 MED ORDER — GADOBUTROL 1 MMOL/ML IV SOLN
7.0000 mL | Freq: Once | INTRAVENOUS | Status: AC | PRN
  Administered 2022-12-07: 7 mL via INTRAVENOUS

## 2022-12-21 ENCOUNTER — Inpatient Hospital Stay: Payer: Medicare Other | Attending: Hematology

## 2022-12-21 ENCOUNTER — Ambulatory Visit: Payer: Medicare Other | Admitting: Hematology

## 2022-12-21 ENCOUNTER — Inpatient Hospital Stay: Payer: Medicare Other

## 2022-12-21 VITALS — BP 142/59 | HR 56 | Temp 98.0°F | Resp 18 | Wt 161.4 lb

## 2022-12-21 DIAGNOSIS — C787 Secondary malignant neoplasm of liver and intrahepatic bile duct: Secondary | ICD-10-CM | POA: Diagnosis not present

## 2022-12-21 DIAGNOSIS — Z5112 Encounter for antineoplastic immunotherapy: Secondary | ICD-10-CM | POA: Diagnosis not present

## 2022-12-21 DIAGNOSIS — Z79899 Other long term (current) drug therapy: Secondary | ICD-10-CM | POA: Insufficient documentation

## 2022-12-21 DIAGNOSIS — C770 Secondary and unspecified malignant neoplasm of lymph nodes of head, face and neck: Secondary | ICD-10-CM | POA: Insufficient documentation

## 2022-12-21 DIAGNOSIS — C189 Malignant neoplasm of colon, unspecified: Secondary | ICD-10-CM

## 2022-12-21 DIAGNOSIS — C7800 Secondary malignant neoplasm of unspecified lung: Secondary | ICD-10-CM | POA: Insufficient documentation

## 2022-12-21 DIAGNOSIS — C184 Malignant neoplasm of transverse colon: Secondary | ICD-10-CM | POA: Diagnosis not present

## 2022-12-21 DIAGNOSIS — Z95828 Presence of other vascular implants and grafts: Secondary | ICD-10-CM

## 2022-12-21 LAB — CBC WITH DIFFERENTIAL/PLATELET
Abs Immature Granulocytes: 0.03 10*3/uL (ref 0.00–0.07)
Basophils Absolute: 0 10*3/uL (ref 0.0–0.1)
Basophils Relative: 1 %
Eosinophils Absolute: 0.1 10*3/uL (ref 0.0–0.5)
Eosinophils Relative: 2 %
HCT: 42.2 % (ref 36.0–46.0)
Hemoglobin: 13.5 g/dL (ref 12.0–15.0)
Immature Granulocytes: 1 %
Lymphocytes Relative: 23 %
Lymphs Abs: 1.5 10*3/uL (ref 0.7–4.0)
MCH: 29.9 pg (ref 26.0–34.0)
MCHC: 32 g/dL (ref 30.0–36.0)
MCV: 93.4 fL (ref 80.0–100.0)
Monocytes Absolute: 0.8 10*3/uL (ref 0.1–1.0)
Monocytes Relative: 12 %
Neutro Abs: 4.1 10*3/uL (ref 1.7–7.7)
Neutrophils Relative %: 61 %
Platelets: 197 10*3/uL (ref 150–400)
RBC: 4.52 MIL/uL (ref 3.87–5.11)
RDW: 13.2 % (ref 11.5–15.5)
WBC: 6.5 10*3/uL (ref 4.0–10.5)
nRBC: 0 % (ref 0.0–0.2)

## 2022-12-21 LAB — MAGNESIUM: Magnesium: 2.1 mg/dL (ref 1.7–2.4)

## 2022-12-21 LAB — COMPREHENSIVE METABOLIC PANEL
ALT: 22 U/L (ref 0–44)
AST: 26 U/L (ref 15–41)
Albumin: 4.3 g/dL (ref 3.5–5.0)
Alkaline Phosphatase: 64 U/L (ref 38–126)
Anion gap: 8 (ref 5–15)
BUN: 13 mg/dL (ref 8–23)
CO2: 24 mmol/L (ref 22–32)
Calcium: 9 mg/dL (ref 8.9–10.3)
Chloride: 103 mmol/L (ref 98–111)
Creatinine, Ser: 0.63 mg/dL (ref 0.44–1.00)
GFR, Estimated: >60 mL/min (ref >60)
Glucose, Bld: 100 mg/dL — ABNORMAL HIGH (ref 70–99)
Potassium: 3.9 mmol/L (ref 3.5–5.1)
Sodium: 135 mmol/L (ref 135–145)
Total Bilirubin: 0.4 mg/dL (ref 0.3–1.2)
Total Protein: 7.9 g/dL (ref 6.5–8.1)

## 2022-12-21 MED ORDER — SODIUM CHLORIDE 0.9% FLUSH
10.0000 mL | INTRAVENOUS | Status: DC | PRN
  Administered 2022-12-21: 10 mL

## 2022-12-21 MED ORDER — SODIUM CHLORIDE 0.9 % IV SOLN
200.0000 mg | Freq: Once | INTRAVENOUS | Status: AC
  Administered 2022-12-21: 200 mg via INTRAVENOUS
  Filled 2022-12-21: qty 8

## 2022-12-21 MED ORDER — SODIUM CHLORIDE 0.9 % IV SOLN: Freq: Once | INTRAVENOUS | Status: AC

## 2022-12-21 MED ORDER — HEPARIN SOD (PORK) LOCK FLUSH 100 UNIT/ML IV SOLN
500.0000 [IU] | Freq: Once | INTRAVENOUS | Status: AC | PRN
  Administered 2022-12-21: 500 [IU]

## 2022-12-21 NOTE — Progress Notes (Signed)
Patient tolerated chemotherapy with no complaints voiced.  Side effects with management reviewed with understanding verbalized.  Port site clean and dry with no bruising or swelling noted at site.  Good blood return noted before and after administration of chemotherapy.  Band aid applied.  Patient left in satisfactory condition with VSS and no s/s of distress noted.   

## 2022-12-21 NOTE — Patient Instructions (Signed)
[facility] [place]  Discharge Instructions: Thank you for choosing [facility] to provide your oncology and hematology care.  If you have a lab appointment with the Cancer Center - please note that after April 8th, 2024, all labs will be drawn in the cancer center.  You do not have to check in or register with the main entrance as you have in the past but will complete your check-in in the cancer center.  Wear comfortable clothing and clothing appropriate for easy access to any Portacath or PICC line.   We strive to give you quality time with your provider. You may need to reschedule your appointment if you arrive late (15 or more minutes).  Arriving late affects you and other patients whose appointments are after yours.  Also, if you miss three or more appointments without notifying the office, you may be dismissed from the clinic at the provider's discretion.      For prescription refill requests, have your pharmacy contact our office and allow 72 hours for refills to be completed.    Today you received the following chemotherapy and/or immunotherapy agents keytruda   To help prevent nausea and vomiting after your treatment, we encourage you to take your nausea medication as directed.  BELOW ARE SYMPTOMS THAT SHOULD BE REPORTED IMMEDIATELY: *FEVER GREATER THAN 100.4 F (38 C) OR HIGHER *CHILLS OR SWEATING *NAUSEA AND VOMITING THAT IS NOT CONTROLLED WITH YOUR NAUSEA MEDICATION *UNUSUAL SHORTNESS OF BREATH *UNUSUAL BRUISING OR BLEEDING *URINARY PROBLEMS (pain or burning when urinating, or frequent urination) *BOWEL PROBLEMS (unusual diarrhea, constipation, pain near the anus) TENDERNESS IN MOUTH AND THROAT WITH OR WITHOUT PRESENCE OF ULCERS (sore throat, sores in mouth, or a toothache) UNUSUAL RASH, SWELLING OR PAIN  UNUSUAL VAGINAL DISCHARGE OR ITCHING   Items with * indicate a potential emergency and should be followed up as soon as possible or go to the  Emergency Department if any problems should occur.  Please show the CHEMOTHERAPY ALERT CARD or IMMUNOTHERAPY ALERT CARD at check-in to the Emergency Department and triage nurse.  Should you have questions after your visit or need to cancel or reschedule your appointment, please contact [facility] [phone]  and follow the prompts.  Office hours are 8:00 a.m. to 4:30 p.m. Monday - Friday. Please note that voicemails left after 4:00 p.m. may not be returned until the following business day.  We are closed weekends and major holidays. You have access to a nurse at all times for urgent questions. Please call the main number to the clinic [phone] and follow the prompts.  For any non-urgent questions, you may also contact your provider using MyChart. We now offer e-Visits for anyone 18 and older to request care online for non-urgent symptoms. For details visit [email or web address].   Also download the MyChart app! Go to the app store, search "MyChart", open the app, select [facility], and log in with your MyChart username and password.   

## 2023-01-10 ENCOUNTER — Other Ambulatory Visit: Payer: Self-pay

## 2023-01-11 ENCOUNTER — Other Ambulatory Visit: Payer: Self-pay

## 2023-01-12 ENCOUNTER — Other Ambulatory Visit: Payer: Self-pay

## 2023-01-17 ENCOUNTER — Other Ambulatory Visit: Payer: Self-pay

## 2023-01-17 DIAGNOSIS — C189 Malignant neoplasm of colon, unspecified: Secondary | ICD-10-CM

## 2023-01-17 NOTE — Progress Notes (Signed)
[facility] [address] [place] [address]    Clinic Day:  01/18/2023  Referring physician: Elfredia Nevins, MD  Patient Care Team: Elfredia Nevins, MD as PCP - General (Internal Medicine) Franky Macho, MD as Consulting Physician (General Surgery) Doreatha Massed, MD as Medical Oncologist (Medical Oncology)   ASSESSMENT & PLAN:   Assessment: 1.  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 - Status post 3 cycles of FOLFOX from 02/10/2014 through 03/10/2014, discontinued secondary to poor tolerance.  DPD was negative. -Last colonoscopy on 03/28/2018 patent ileo-colonic anastomosis.  8 mm polyp in the transverse colon.  Diverticulosis in the sigmoid colon, descending colon. - CT scan on 11/04/2018 shows postoperative findings of right colectomy with redemonstrated mesenteric nodule/lymph node measuring 1.9 x 1.7 cm, unchanged from prior scan in October 2019.  No evidence of metastatic disease.  Stable solid and groundglass pulmonary nodules, stable over multiple prior exams. -CEA was 4.6 on 10/28/2018. -CTAP on 09/21/2020 showed numerous small pulmonary nodules throughout the lung bases, 5 mm.  Enlarged retrocrural periaortic lymph node in the lower chest.  Soft tissue nodule within the central mesentery substantially increased in size, encasing proximal superior mesenteric artery and measuring 5 x 4.3 cm.  Numerous retroperitoneal lymph nodes, largest aortocaval lymph node measuring 2.8 x 2.2 cm. -CT chest on 09/27/2020 shows a left thoracic inlet lymph node approximately 2.3 cm.  Mediastinal lymph nodes and multiple subcentimeter pulmonary nodules suggestive of metastatic disease. -Mesenteric lymph node biopsy on 09/28/2020 shows mucin and fibrosis. -Left supraclavicular lymph node biopsy on 10/14/2020-soft tissue with abundant  mucin, rare fragments of atypical columnar epithelium. - 3 cycles of FOLFIRI and bevacizumab dose reduced from 11/10/2020 through 12/08/2020. - Caris test-BRAF V600 E+, MMR deficient, MSI high, TMB high, HER2 negative, BRCA1/2 pathogenic variant on exon 14 and exon 9 respectively.  The report also suggested decreased benefit to FOLFOX and bevacizumab in the first-line metastatic setting. - Cycle 1 of Keytruda on 01/13/2021.    Plan: 1.  Metastatic colon cancer to the liver, lungs and lymph nodes, BRAF V600E+: - She is tolerating immunotherapy very well. - CT CAP on 11/02/2022: Stable to slightly improved disease. - She complained of feeling heavy in her head.  We did MRI of the brain and reviewed results which did not show any evidence of intracranial metastatic disease. - Last CEA was 5.5.  Comprehensive metabolic panel is within normal limits.  CBC was also normal. - She does not have any immunotherapy related side effects.  No skin rashes reported. - Continue Keytruda every 3 weeks.  RTC 9 weeks for follow-up.  Will plan to repeat CT CAP with contrast and CEA levels.   2.  Diarrhea: - Baseline diarrhea is stable.  She requires Imodium 2 tablets daily on most days and occasionally 3 tablets daily.    Orders Placed This Encounter  Procedures   CT CHEST ABDOMEN PELVIS W CONTRAST    Standing Status:   Future    Standing Expiration Date:   01/18/2024    Order Specific Question:   If indicated for the ordered procedure, I authorize the administration of contrast media per Radiology protocol    Answer:   Yes    Order Specific Question:   Does the patient have a contrast media/X-ray dye allergy?    Answer:   No    Order Specific Question:  Preferred imaging location?    Answer:   [facility]    Order Specific Question:   If indicated for the ordered procedure, I authorize the administration of oral contrast media per Radiology protocol    Answer:   Yes   Magnesium    Standing Status:    Future    Standing Expiration Date:   04/25/2024   CBC with Differential    Standing Status:   Future    Standing Expiration Date:   04/25/2024   Comprehensive metabolic panel    Standing Status:   Future    Standing Expiration Date:   04/25/2024   Magnesium    Standing Status:   Future    Standing Expiration Date:   05/16/2024   CBC with Differential    Standing Status:   Future    Standing Expiration Date:   05/16/2024   Comprehensive metabolic panel    Standing Status:   Future    Standing Expiration Date:   05/16/2024   Magnesium    Standing Status:   Future    Standing Expiration Date:   06/06/2024   CBC with Differential    Standing Status:   Future    Standing Expiration Date:   06/06/2024   Comprehensive metabolic panel    Standing Status:   Future    Standing Expiration Date:   06/06/2024   Magnesium    Standing Status:   Future    Standing Expiration Date:   06/27/2024   CBC with Differential    Standing Status:   Future    Standing Expiration Date:   06/27/2024   Comprehensive metabolic panel    Standing Status:   Future    Standing Expiration Date:   06/27/2024   Magnesium    Standing Status:   Future    Standing Expiration Date:   07/18/2024   CBC with Differential    Standing Status:   Future    Standing Expiration Date:   07/18/2024   Comprehensive metabolic panel    Standing Status:   Future    Standing Expiration Date:   07/18/2024      Mikeal Hawthorne R Teague,acting as a scribe for Doreatha Massed, MD.,have documented all relevant documentation on the behalf of Doreatha Massed, MD,as directed by  Doreatha Massed, MD while in the presence of Doreatha Massed, MD.  I, Doreatha Massed MD, have reviewed the above documentation for accuracy and completeness, and I agree with the above.    Doreatha Massed, MD   7/11/20241:40 PM  CHIEF COMPLAINT:   Diagnosis: metastatic transverse colon adenocarcinoma    Cancer Staging  No matching  staging information was found for the patient.    Prior Therapy: 1. Right hemicolectomy on 12/29/2013. 2. FOLFOX x 3 cycles from 02/10/2014 to 03/10/2014, stopped secondary to poor tolerance  Current Therapy:  Keytruda every 3 weeks    HISTORY OF PRESENT ILLNESS:   Oncology History  Adenocarcinoma of transverse colon (HCC) (Resolved)  12/09/2013 Initial Diagnosis   1. Colon, biopsy, proximal transverse mass INVASIVE ADENOCARCINOMA.   12/29/2013 Definitive Surgery   Colon, segmental resection for tumor, right - INVASIVE COLORECTAL ADENOCARCINOMA, 7 CM, EXTENDING INTO PERICOLONIC CONNECTIVE TISSUE. - METASTATIC CARCINOMA IN 6 OF 33 LYMPH NODES (6/33).   02/10/2014 - 03/10/2014 Adjuvant Chemotherapy   FOLFOX x 3 cycles   03/11/2014 Adverse Reaction   Patient called reporting diarrhea despite dose reduction and she wants to stop therapy.  Patient seen on 03/24/14 to discuss other  treatment options and she stands by her decision to stop all therapy.   04/01/2014 Surgery   Port-a-cath removal by Dr. Lovell Sheehan.   01/12/2015 PET scan   Mild abdominal mesenteric LAD show hypermetabolic activity, 7 mm indeterminate pulm nodule in sup segment of RLL shows no assoc metabolic activity   03/24/2015 PET scan   Mild progression of nodal mets in anterior mid abdominal mesentery, new nodal mets at L thoracic inlet. stable 6 mm nodule in posterior RLL, unchanged since 2015   06/22/2015 PET scan   Resolution of metabolic activity and decreased size of mild LAD at L thoracic inlet, stable mild mid abd hypermetabolic mesenteric LAD, stable 6 mm posterior LLL pulm nodule without metabolic activity   12/22/2015 Imaging   MRI lumbar spine L4-L5 disc degenerated, broad based disc hernation, B facet arthropathy with hypertophy and edema, stenosis of lateral recesses that could cause neural compression   10/18/2016 Imaging   CT CAP- 1. Several small ground-glass pulmonary nodules are present in the lungs and are stable  over the past 9 months, but several are mildly larger than they were in 2015. Low-grade adenocarcinoma can sometimes have this appearance and surveillance is likely warranted. 2. There is a cluster of nodal tissue in the central mesentery, maximum short axis diameter 1.3 cm. This nodal cluster is relatively low in density and not appreciably changed from the prior exam. Given the lack of change is may represent quiescent residua of malignancy, but again, surveillance is likely warranted. 3. The left-sided rib fractures are more numerous than was revealed on the prior radiographs ; there are nondisplaced healing fractures of the left fifth, sixth, seventh, eighth, ninth, and tenth ribs. 4. Mild cardiomegaly although part of this appearance may be due to pectus excavatum. 5. Several additional small pulmonary nodules are stable from 2015 and considered benign. 6. Right hemicolectomy and sigmoid colon diverticulosis. 7. Small right paraumbilical hernia containing adipose tissue. 8. Lower lumbar spondylosis and degenerative disc disease potentially with mild impingement at L4-5.   04/25/2017 Imaging   CT C/A/P: Scattered solid pulmonary nodules measure up to 6 mm in the right lower lobe, unchanged. There are a few scattered ground-glass nodules measuring up to 8 mm in the right upper lobe, also stable. Distal perigastric lymph nodes are stable. No additional evidence of metastatic disease.   Metastatic colon cancer to liver (HCC)  11/04/2020 Initial Diagnosis   Metastatic colon cancer to liver (HCC)   11/10/2020 - 12/10/2020 Chemotherapy         01/13/2021 - 03/02/2022 Chemotherapy   Patient is on Treatment Plan : COLORECTAL Pembrolizumab q21d     01/13/2021 -  Chemotherapy   Patient is on Treatment Plan : COLORECTAL Pembrolizumab (200) q21d        INTERVAL HISTORY:   Idamae is a 77 y.o. female presenting to clinic today for follow up of metastatic transverse colon adenocarcinoma. She was  last seen by me on 11/09/22.  Since her last visit, she underwent an MRI of the head with and without contrast on 5/30 which found no evidence of intracranial metastatic disease, mild chronic small vessel ischemic changes within the cerebral white matter, and mild generalized cerebral atrophy.   Today, she states that she is doing well overall. Her appetite level is at 100%. Her energy level is at 70%.  PAST MEDICAL HISTORY:   Past Medical History: Past Medical History:  Diagnosis Date   Arthritis    wrist and thumbs   Colon cancer (  HCC)    PONV (postoperative nausea and vomiting)    Port-A-Cath in place 11/09/2020   Ruptured lumbar disc    L1-L5 per patient report   Sciatica of left side     Surgical History: Past Surgical History:  Procedure Laterality Date   COLONOSCOPY N/A 12/12/2013   Procedure: COLONOSCOPY;  Surgeon: Malissa Hippo, MD;  Location: [facility];  Service: Endoscopy;  Laterality: N/A;  730   COLONOSCOPY N/A 01/20/2015   Procedure: COLONOSCOPY;  Surgeon: Malissa Hippo, MD;  Location: [facility];  Service: Endoscopy;  Laterality: N/A;  1030   COLONOSCOPY N/A 03/28/2018   Procedure: COLONOSCOPY;  Surgeon: Malissa Hippo, MD;  Location: [facility];  Service: Endoscopy;  Laterality: N/A;  1015   EUS N/A 12/18/2013   Procedure: UPPER ENDOSCOPIC ULTRASOUND (EUS) LINEAR;  Surgeon: Rachael Fee, MD;  Location: [facility];  Service: Endoscopy;  Laterality: N/A;   herniated disc     [id number]   IR IMAGING GUIDED PORT INSERTION  10/14/2020   IR US GUIDANCE  10/14/2020   PARTIAL COLECTOMY N/A 12/29/2013   Procedure: RIGHT HEMICOLECTOMY;  Surgeon: Dalia Heading, MD;  Location: [facility];  Service: General;  Laterality: N/A;   POLYPECTOMY  03/28/2018   Procedure: POLYPECTOMY;  Surgeon: Malissa Hippo, MD;  Location: [facility];  Service: Endoscopy;;  transverse colon (hot snare);   PORT-A-CATH REMOVAL Right 04/01/2014   Procedure: MINOR REMOVAL PORT-A-CATH;   Surgeon: Dalia Heading, MD;  Location: [facility];  Service: General;  Laterality: Right;   PORTACATH PLACEMENT Right 02/04/2014   Procedure: INSERTION PORT-A-CATH;  Surgeon: Dalia Heading, MD;  Location: [facility];  Service: General;  Laterality: Right;    Social History: Social History   Socioeconomic History   Marital status: Married    Spouse name: Not on file   Number of children: Not on file   Years of education: Not on file   Highest education level: Not on file  Occupational History   Not on file  Tobacco Use   Smoking status: Former    Current packs/day: 0.00    Average packs/day: 0.3 packs/day for 5.0 years (1.3 ttl pk-yrs)    Types: Cigarettes    Start date: 04/18/1954    Quit date: 04/19/1959    Years since quitting: 63.7   Smokeless tobacco: Never   Tobacco comments:    smoked for 5 years as teenager  Vaping Use   Vaping status: Never Used  Substance and Sexual Activity   Alcohol use: No   Drug use: No   Sexual activity: Yes    Birth control/protection: Post-menopausal  Other Topics Concern   Not on file  Social History Narrative   Not on file   Social Determinants of Health   Financial Resource Strain: Low Risk  (10/04/2020)   Overall Financial Resource Strain (CARDIA)    Difficulty of Paying Living Expenses: Not hard at all  Food Insecurity: No Food Insecurity (10/04/2020)   Hunger Vital Sign    Worried About Running Out of Food in the Last Year: Never true    Ran Out of Food in the Last Year: Never true  Transportation Needs: No Transportation Needs (10/04/2020)   PRAPARE - Administrator, Civil Service (Medical): No    Lack of Transportation (Non-Medical): No  Physical Activity: Inactive (10/04/2020)   Exercise Vital Sign    Days of Exercise per Week: 0 days    Minutes  of Exercise per Session: 0 min  Stress: Stress Concern Present (10/04/2020)   [employer] of Occupational Health - Occupational Stress Questionnaire    Feeling of Stress  : To some extent  Social Connections: Socially Integrated (10/04/2020)   Social Connection and Isolation Panel [NHANES]    Frequency of Communication with Friends and Family: More than three times a week    Frequency of Social Gatherings with Friends and Family: More than three times a week    Attends Religious Services: More than 4 times per year    Active Member of [employer] or Organizations: Yes    Attends Engineer, structural: More than 4 times per year    Marital Status: Married  Catering manager Violence: Not At Risk (10/04/2020)   Humiliation, Afraid, Rape, and Kick questionnaire    Fear of Current or Ex-Partner: No    Emotionally Abused: No    Physically Abused: No    Sexually Abused: No    Family History: Family History  Problem Relation Age of Onset   Diabetes type II Mother    Dementia Father     Current Medications:  Current Outpatient Medications:    acetaminophen (TYLENOL) 500 MG tablet, Take 500 mg by mouth every 6 (six) hours as needed for moderate pain or headache., Disp: , Rfl:    aspirin 81 MG EC tablet, Take 1 tablet (81 mg total) by mouth daily. Swallow whole., Disp: 30 tablet, Rfl: 12   Bioflavonoid Products (ESTER C PO), Take 1,000 mg by mouth daily., Disp: , Rfl:    Cholecalciferol (D3-1000) 25 MCG (1000 UT) capsule, Take 125 Units by mouth daily., Disp: , Rfl:    Cranberry-Vitamin C-Vitamin E 4200-20-3 MG-MG-UNIT CAPS, Take 500 mg by mouth daily., Disp: , Rfl:    Garlic 1000 MG CAPS, Take 1,000 mg by mouth daily. , Disp: , Rfl:    lidocaine-prilocaine (EMLA) cream, , Disp: , Rfl:    Multiple Vitamin (MULTIVITAMIN) tablet, Take 1 tablet by mouth daily., Disp: , Rfl:    Omega-3 Fatty Acids (FISH OIL) 1200 MG CAPS, Take 1,000 mg by mouth daily., Disp: , Rfl:    OVER THE COUNTER MEDICATION, Take 1,000 mg by mouth daily. Moringa 1000 mg daily, Disp: , Rfl:    OVER THE COUNTER MEDICATION, Take 1,000 mg by mouth daily. L-Lysine 1000mg  po daily, Disp: , Rfl:     pramoxine-hydrocortisone (PROCTOCREAM-HC) 1-1 % rectal cream, Place 1 application rectally 2 (two) times daily., Disp: 30 g, Rfl: 0   zinc gluconate 50 MG tablet, Take 50 mg by mouth daily., Disp: , Rfl:  No current facility-administered medications for this visit.  Facility-Administered Medications Ordered in Other Visits:    0.9 %  sodium chloride infusion, , Intravenous, Once, Doreatha Massed, MD   heparin lock flush 100 unit/mL, 500 Units, Intracatheter, Once PRN, Doreatha Massed, MD   pembrolizumab [facility]) 200 mg in sodium chloride 0.9 % 50 mL chemo infusion, 200 mg, Intravenous, Once, Doreatha Massed, MD   sodium chloride flush (NS) 0.9 % injection 10 mL, 10 mL, Intracatheter, PRN, Doreatha Massed, MD   Allergies: Allergies  Allergen Reactions   Sulfa Antibiotics Other (See Comments)    "Sores in my mouth"    REVIEW OF SYSTEMS:   Review of Systems  Constitutional:  Negative for chills, fatigue and fever.  HENT:   Negative for lump/mass, mouth sores, nosebleeds, sore throat and trouble swallowing.   Eyes:  Negative for eye problems.  Respiratory:  Negative for  cough and shortness of breath.   Cardiovascular:  Negative for chest pain, leg swelling and palpitations.  Gastrointestinal:  Positive for diarrhea. Negative for abdominal pain, constipation, nausea and vomiting.  Genitourinary:  Negative for bladder incontinence, difficulty urinating, dysuria, frequency, hematuria and nocturia.   Musculoskeletal:  Negative for arthralgias, back pain, flank pain, myalgias and neck pain.  Skin:  Negative for itching and rash.  Neurological:  Negative for dizziness, headaches and numbness.  Hematological:  Does not bruise/bleed easily.  Psychiatric/Behavioral:  Negative for depression, sleep disturbance and suicidal ideas. The patient is not nervous/anxious.   All other systems reviewed and are negative.    VITALS:   Blood pressure (!) 146/66, pulse 82,  temperature 98.7 F (37.1 C), resp. rate 17, weight 162 lb 12.8 oz (73.8 kg), SpO2 97%.  Wt Readings from Last 3 Encounters:  01/18/23 162 lb 12.8 oz (73.8 kg)  12/21/22 161 lb 6.4 oz (73.2 kg)  11/30/22 162 lb (73.5 kg)    Body mass index is 28.84 kg/m.  Performance status (ECOG): 1 - Symptomatic but completely ambulatory  PHYSICAL EXAM:   Physical Exam Vitals and nursing note reviewed. Exam conducted with a chaperone present.  Constitutional:      Appearance: Normal appearance.  Cardiovascular:     Rate and Rhythm: Normal rate and regular rhythm.     Pulses: Normal pulses.     Heart sounds: Normal heart sounds.  Pulmonary:     Effort: Pulmonary effort is normal.     Breath sounds: Normal breath sounds.  Abdominal:     Palpations: Abdomen is soft. There is no hepatomegaly, splenomegaly or mass.     Tenderness: There is no abdominal tenderness.  Musculoskeletal:     Right lower leg: No edema.     Left lower leg: No edema.  Lymphadenopathy:     Cervical: No cervical adenopathy.     Right cervical: No superficial, deep or posterior cervical adenopathy.    Left cervical: No superficial, deep or posterior cervical adenopathy.     Upper Body:     Right upper body: No supraclavicular or axillary adenopathy.     Left upper body: No supraclavicular or axillary adenopathy.  Neurological:     General: No focal deficit present.     Mental Status: She is alert and oriented to person, place, and time.  Psychiatric:        Mood and Affect: Mood normal.        Behavior: Behavior normal.     LABS:      Latest Ref Rng & Units 01/18/2023   11:06 AM 12/21/2022    9:32 AM 11/30/2022   12:22 PM  CBC  WBC 4.0 - 10.5 K/uL 7.5  6.5  6.7   Hemoglobin 12.0 - 15.0 g/dL 16.1  09.6  04.5   Hematocrit 36.0 - 46.0 % 40.7  42.2  40.2   Platelets 150 - 400 K/uL 193  197  171       Latest Ref Rng & Units 01/18/2023   11:06 AM 12/21/2022    9:32 AM 11/30/2022   12:22 PM  CMP  Glucose 70 - 99  mg/dL 409  811  90   BUN 8 - 23 mg/dL 15  13  13    Creatinine 0.44 - 1.00 mg/dL 9.14  7.82  9.56   Sodium 135 - 145 mmol/L 135  135  136   Potassium 3.5 - 5.1 mmol/L 4.4  3.9  4.2   Chloride  98 - 111 mmol/L 101  103  102   CO2 22 - 32 mmol/L 26  24  24    Calcium 8.9 - 10.3 mg/dL 9.2  9.0  9.1   Total Protein 6.5 - 8.1 g/dL 8.1  7.9  7.9   Total Bilirubin 0.3 - 1.2 mg/dL 0.5  0.4  1.0   Alkaline Phos 38 - 126 U/L 68  64  65   AST 15 - 41 U/L 28  26  25    ALT 0 - 44 U/L 25  22  21       Lab Results  Component Value Date   CEA1 5.5 (H) 10/19/2022   CEA 7.7 (H) 08/26/2020   /  CEA  Date Value Ref Range Status  10/19/2022 5.5 (H) 0.0 - 4.7 ng/mL Final    Comment:    (NOTE)                             Nonsmokers          <3.9                             Smokers             <5.6 Roche [employer]ostics Electrochemiluminescence Immunoassay (ECLIA) Values obtained with different assay methods or kits cannot be used interchangeably.  Results cannot be interpreted as absolute evidence of the presence or absence of malignant disease. Performed At: [facility] [address] [place], [place] [id number] Jolene Schimke MD WU:[id number]   08/26/2020 7.7 (H) ng/mL Final    Comment:    Non-Smoker: <2.5 Smoker:     <5.0 . . This test was performed using the Siemens  chemiluminescent method. Values obtained from different assay methods cannot be used interchangeably. CEA levels, regardless of value, should not be interpreted as absolute evidence of the presence or absence of disease. .    No results found for: "PSA1" No results found for: "NFA213" No results found for: "CAN125"  No results found for: "TOTALPROTELP", "ALBUMINELP", "A1GS", "A2GS", "BETS", "BETA2SER", "GAMS", "MSPIKE", "SPEI" Lab Results  Component Value Date   FERRITIN 119 10/28/2018   FERRITIN 63 02/17/2014   Lab Results  Component Value Date   LDH 170 10/28/2018   LDH 153 04/25/2018     STUDIES:    No results found.

## 2023-01-18 ENCOUNTER — Inpatient Hospital Stay (HOSPITAL_BASED_OUTPATIENT_CLINIC_OR_DEPARTMENT_OTHER): Payer: Medicare Other | Admitting: Hematology

## 2023-01-18 ENCOUNTER — Inpatient Hospital Stay: Payer: Medicare Other | Attending: Hematology

## 2023-01-18 ENCOUNTER — Inpatient Hospital Stay: Payer: Medicare Other

## 2023-01-18 VITALS — BP 146/66 | HR 82 | Temp 98.7°F | Resp 17 | Wt 162.8 lb

## 2023-01-18 VITALS — BP 118/60 | HR 55 | Temp 97.7°F | Resp 18

## 2023-01-18 DIAGNOSIS — C184 Malignant neoplasm of transverse colon: Secondary | ICD-10-CM | POA: Diagnosis not present

## 2023-01-18 DIAGNOSIS — Z95828 Presence of other vascular implants and grafts: Secondary | ICD-10-CM

## 2023-01-18 DIAGNOSIS — Z79899 Other long term (current) drug therapy: Secondary | ICD-10-CM | POA: Diagnosis not present

## 2023-01-18 DIAGNOSIS — C787 Secondary malignant neoplasm of liver and intrahepatic bile duct: Secondary | ICD-10-CM

## 2023-01-18 DIAGNOSIS — C7800 Secondary malignant neoplasm of unspecified lung: Secondary | ICD-10-CM | POA: Diagnosis not present

## 2023-01-18 DIAGNOSIS — Z5112 Encounter for antineoplastic immunotherapy: Secondary | ICD-10-CM | POA: Insufficient documentation

## 2023-01-18 DIAGNOSIS — E032 Hypothyroidism due to medicaments and other exogenous substances: Secondary | ICD-10-CM

## 2023-01-18 DIAGNOSIS — C189 Malignant neoplasm of colon, unspecified: Secondary | ICD-10-CM

## 2023-01-18 LAB — CBC WITH DIFFERENTIAL/PLATELET
Abs Immature Granulocytes: 0.04 10*3/uL (ref 0.00–0.07)
Basophils Absolute: 0.1 10*3/uL (ref 0.0–0.1)
Basophils Relative: 1 %
Eosinophils Absolute: 0.2 10*3/uL (ref 0.0–0.5)
Eosinophils Relative: 2 %
HCT: 40.7 % (ref 36.0–46.0)
Hemoglobin: 13.1 g/dL (ref 12.0–15.0)
Immature Granulocytes: 1 %
Lymphocytes Relative: 20 %
Lymphs Abs: 1.5 10*3/uL (ref 0.7–4.0)
MCH: 30 pg (ref 26.0–34.0)
MCHC: 32.2 g/dL (ref 30.0–36.0)
MCV: 93.1 fL (ref 80.0–100.0)
Monocytes Absolute: 0.9 10*3/uL (ref 0.1–1.0)
Monocytes Relative: 11 %
Neutro Abs: 4.8 10*3/uL (ref 1.7–7.7)
Neutrophils Relative %: 65 %
Platelets: 193 10*3/uL (ref 150–400)
RBC: 4.37 MIL/uL (ref 3.87–5.11)
RDW: 13.2 % (ref 11.5–15.5)
WBC: 7.5 10*3/uL (ref 4.0–10.5)
nRBC: 0 % (ref 0.0–0.2)

## 2023-01-18 LAB — COMPREHENSIVE METABOLIC PANEL
ALT: 25 U/L (ref 0–44)
AST: 28 U/L (ref 15–41)
Albumin: 4.1 g/dL (ref 3.5–5.0)
Alkaline Phosphatase: 68 U/L (ref 38–126)
Anion gap: 8 (ref 5–15)
BUN: 15 mg/dL (ref 8–23)
CO2: 26 mmol/L (ref 22–32)
Calcium: 9.2 mg/dL (ref 8.9–10.3)
Chloride: 101 mmol/L (ref 98–111)
Creatinine, Ser: 0.68 mg/dL (ref 0.44–1.00)
GFR, Estimated: >60 mL/min (ref >60)
Glucose, Bld: 101 mg/dL — ABNORMAL HIGH (ref 70–99)
Potassium: 4.4 mmol/L (ref 3.5–5.1)
Sodium: 135 mmol/L (ref 135–145)
Total Bilirubin: 0.5 mg/dL (ref 0.3–1.2)
Total Protein: 8.1 g/dL (ref 6.5–8.1)

## 2023-01-18 LAB — TSH: TSH: 1.686 u[IU]/mL (ref 0.350–4.500)

## 2023-01-18 LAB — MAGNESIUM: Magnesium: 2.2 mg/dL (ref 1.7–2.4)

## 2023-01-18 MED ORDER — SODIUM CHLORIDE 0.9 % IV SOLN
200.0000 mg | Freq: Once | INTRAVENOUS | Status: AC
  Administered 2023-01-18: 200 mg via INTRAVENOUS
  Filled 2023-01-18: qty 8

## 2023-01-18 MED ORDER — SODIUM CHLORIDE 0.9 % IV SOLN: Freq: Once | INTRAVENOUS | Status: AC

## 2023-01-18 MED ORDER — SODIUM CHLORIDE 0.9% FLUSH
10.0000 mL | INTRAVENOUS | Status: DC | PRN
  Administered 2023-01-18: 10 mL

## 2023-01-18 MED ORDER — HEPARIN SOD (PORK) LOCK FLUSH 100 UNIT/ML IV SOLN
500.0000 [IU] | Freq: Once | INTRAVENOUS | Status: AC | PRN
  Administered 2023-01-18: 500 [IU]

## 2023-01-18 NOTE — Progress Notes (Signed)
Patient presents today for Keytruda infusion. Patient is in satisfactory condition with no new complaints voiced.  Vital signs are stable.  Labs reviewed by Dr. Ellin Saba during the office visit and all labs are within treatment parameters.  We will proceed with treatment per MD orders.   Patient tolerated treatment well with no complaints voiced.  Patient left ambulatory with son in stable condition.  Vital signs stable at discharge.  Follow up as scheduled.

## 2023-01-18 NOTE — Patient Instructions (Signed)
[facility] at [facility] Discharge Instructions   You were seen and examined today by Dr. Katragadda.  He reviewed the results of your lab work which are normal/stable.   We will proceed with your treatment today.  Return as scheduled.    Thank you for choosing [facility] at [facility] to provide your oncology and hematology care.  To afford each patient quality time with our provider, please arrive at least 15 minutes before your scheduled appointment time.   If you have a lab appointment with the Cancer Center please come in thru the Main Entrance and check in at the main information desk.  You need to re-schedule your appointment should you arrive 10 or more minutes late.  We strive to give you quality time with our providers, and arriving late affects you and other patients whose appointments are after yours.  Also, if you no show three or more times for appointments you may be dismissed from the clinic at the providers discretion.     Again, thank you for choosing [facility].  Our hope is that these requests will decrease the amount of time that you wait before being seen by our physicians.       _____________________________________________________________  Should you have questions after your visit to [facility], please contact our office at [phone] and follow the prompts.  Our office hours are 8:00 a.m. and 4:30 p.m. Monday - Friday.  Please note that voicemails left after 4:00 p.m. may not be returned until the following business day.  We are closed weekends and major holidays.  You do have access to a nurse 24-7, just call the main number to the clinic [phone] and do not press any options, hold on the line and a nurse will answer the phone.    For prescription refill requests, have your pharmacy contact our office and allow 72 hours.    Due to Covid, you will need to wear a mask upon entering  the hospital. If you do not have a mask, a mask will be given to you at the Main Entrance upon arrival. For doctor visits, patients may have 1 support person age 18 or older with them. For treatment visits, patients can not have anyone with them due to social distancing guidelines and our immunocompromised population.      

## 2023-01-18 NOTE — Progress Notes (Signed)
Patient has been examined by Dr. Katragadda. Vital signs and labs have been reviewed by MD - ANC, Creatinine, LFTs, hemoglobin, and platelets are within treatment parameters per M.D. - pt may proceed with treatment.  Primary RN and pharmacy notified.  

## 2023-01-18 NOTE — Patient Instructions (Signed)
[facility]  Discharge Instructions: Thank you for choosing [facility] to provide your oncology and hematology care.  If you have a lab appointment with the Cancer Center - please note that after April 8th, 2024, all labs will be drawn in the cancer center.  You do not have to check in or register with the main entrance as you have in the past but will complete your check-in in the cancer center.  Wear comfortable clothing and clothing appropriate for easy access to any Portacath or PICC line.   We strive to give you quality time with your provider. You may need to reschedule your appointment if you arrive late (15 or more minutes).  Arriving late affects you and other patients whose appointments are after yours.  Also, if you miss three or more appointments without notifying the office, you may be dismissed from the clinic at the provider's discretion.      For prescription refill requests, have your pharmacy contact our office and allow 72 hours for refills to be completed.    Today you received the following chemotherapy and/or immunotherapy agents Keytruda. Pembrolizumab Injection What is this medication? PEMBROLIZUMAB (PEM broe LIZ ue mab) treats some types of cancer. It works by helping your immune system slow or stop the spread of cancer cells. It is a monoclonal antibody. This medicine may be used for other purposes; ask your health care provider or pharmacist if you have questions. COMMON BRAND NAME(S): Keytruda What should I tell my care team before I take this medication? They need to know if you have any of these conditions: Allogeneic stem cell transplant (uses someone else's stem cells) Autoimmune diseases, such as Crohn disease, ulcerative colitis, lupus History of chest radiation Nervous system problems, such as Guillain-Barre syndrome, myasthenia gravis Organ transplant An unusual or allergic reaction to pembrolizumab, other medications,  foods, dyes, or preservatives Pregnant or trying to get pregnant Breast-feeding How should I use this medication? This medication is injected into a vein. It is given by your care team in a hospital or clinic setting. A special MedGuide will be given to you before each treatment. Be sure to read this information carefully each time. Talk to your care team about the use of this medication in children. While it may be prescribed for children as young as 6 months for selected conditions, precautions do apply. Overdosage: If you think you have taken too much of this medicine contact a poison control center or emergency room at once. NOTE: This medicine is only for you. Do not share this medicine with others. What if I miss a dose? Keep appointments for follow-up doses. It is important not to miss your dose. Call your care team if you are unable to keep an appointment. What may interact with this medication? Interactions have not been studied. This list may not describe all possible interactions. Give your health care provider a list of all the medicines, herbs, non-prescription drugs, or dietary supplements you use. Also tell them if you smoke, drink alcohol, or use illegal drugs. Some items may interact with your medicine. What should I watch for while using this medication? Your condition will be monitored carefully while you are receiving this medication. You may need blood work while taking this medication. This medication may cause serious skin reactions. They can happen weeks to months after starting the medication. Contact your care team right away if you notice fevers or flu-like symptoms with a rash. The rash   may be red or purple and then turn into blisters or peeling of the skin. You may also notice a red rash with swelling of the face, lips, or lymph nodes in your neck or under your arms. Tell your care team right away if you have any change in your eyesight. Talk to your care team if you  may be pregnant. Serious birth defects can occur if you take this medication during pregnancy and for 4 months after the last dose. You will need a negative pregnancy test before starting this medication. Contraception is recommended while taking this medication and for 4 months after the last dose. Your care team can help you find the option that works for you. Do not breastfeed while taking this medication and for 4 months after the last dose. What side effects may I notice from receiving this medication? Side effects that you should report to your care team as soon as possible: Allergic reactions--skin rash, itching, hives, swelling of the face, lips, tongue, or throat Dry cough, shortness of breath or trouble breathing Eye pain, redness, irritation, or discharge with blurry or decreased vision Heart muscle inflammation--unusual weakness or fatigue, shortness of breath, chest pain, fast or irregular heartbeat, dizziness, swelling of the ankles, feet, or hands Hormone gland problems--headache, sensitivity to light, unusual weakness or fatigue, dizziness, fast or irregular heartbeat, increased sensitivity to cold or heat, excessive sweating, constipation, hair loss, increased thirst or amount of urine, tremors or shaking, irritability Infusion reactions--chest pain, shortness of breath or trouble breathing, feeling faint or lightheaded Kidney injury (glomerulonephritis)--decrease in the amount of urine, red or dark Amanda Norris urine, foamy or bubbly urine, swelling of the ankles, hands, or feet Liver injury--right upper belly pain, loss of appetite, nausea, light-colored stool, dark yellow or Amanda Norris urine, yellowing skin or eyes, unusual weakness or fatigue Pain, tingling, or numbness in the hands or feet, muscle weakness, change in vision, confusion or trouble speaking, loss of balance or coordination, trouble walking, seizures Rash, fever, and swollen lymph nodes Redness, blistering, peeling, or loosening  of the skin, including inside the mouth Sudden or severe stomach pain, bloody diarrhea, fever, nausea, vomiting Side effects that usually do not require medical attention (report to your care team if they continue or are bothersome): Bone, joint, or muscle pain Diarrhea Fatigue Loss of appetite Nausea Skin rash This list may not describe all possible side effects. Call your doctor for medical advice about side effects. You may report side effects to FDA at 1-800-FDA-1088. Where should I keep my medication? This medication is given in a hospital or clinic. It will not be stored at home. NOTE: This sheet is a summary. It may not cover all possible information. If you have questions about this medicine, talk to your doctor, pharmacist, or health care provider.  2024 Elsevier/Gold Standard (2021-11-08 00:00:00)       To help prevent nausea and vomiting after your treatment, we encourage you to take your nausea medication as directed.  BELOW ARE SYMPTOMS THAT SHOULD BE REPORTED IMMEDIATELY: *FEVER GREATER THAN 100.4 F (38 C) OR HIGHER *CHILLS OR SWEATING *NAUSEA AND VOMITING THAT IS NOT CONTROLLED WITH YOUR NAUSEA MEDICATION *UNUSUAL SHORTNESS OF BREATH *UNUSUAL BRUISING OR BLEEDING *URINARY PROBLEMS (pain or burning when urinating, or frequent urination) *BOWEL PROBLEMS (unusual diarrhea, constipation, pain near the anus) TENDERNESS IN MOUTH AND THROAT WITH OR WITHOUT PRESENCE OF ULCERS (sore throat, sores in mouth, or a toothache) UNUSUAL RASH, SWELLING OR PAIN  UNUSUAL VAGINAL DISCHARGE OR ITCHING     Items with * indicate a potential emergency and should be followed up as soon as possible or go to the Emergency Department if any problems should occur.  Please show the CHEMOTHERAPY ALERT CARD or IMMUNOTHERAPY ALERT CARD at check-in to the Emergency Department and triage nurse.  Should you have questions after your visit or need to cancel or reschedule your appointment, please contact  [facility] [phone]  and follow the prompts.  Office hours are 8:00 a.m. to 4:30 p.m. Monday - Friday. Please note that voicemails left after 4:00 p.m. may not be returned until the following business day.  We are closed weekends and major holidays. You have access to a nurse at all times for urgent questions. Please call the main number to the clinic [phone] and follow the prompts.  For any non-urgent questions, you may also contact your provider using MyChart. We now offer e-Visits for anyone 18 and older to request care online for non-urgent symptoms. For details visit [email or web address].   Also download the MyChart app! Go to the app store, search "MyChart", open the app, select [facility], and log in with your MyChart username and password.   

## 2023-01-19 ENCOUNTER — Other Ambulatory Visit: Payer: Self-pay

## 2023-01-29 IMAGING — XA IR IMAGING GUIDED PORT INSERTION
1 series · 1 of 1 positions shown · non-contrast
Comparison: None.

INDICATION: 74-year-old with history of colon cancer and evidence for metastatic
disease on recent imaging.

EXAM:
FLUOROSCOPIC AND ULTRASOUND GUIDED PLACEMENT OF A SUBCUTANEOUS PORT

[Series 300: ir imaging guided port insertion · 1 of 1 slices shown]
[im 1/1]
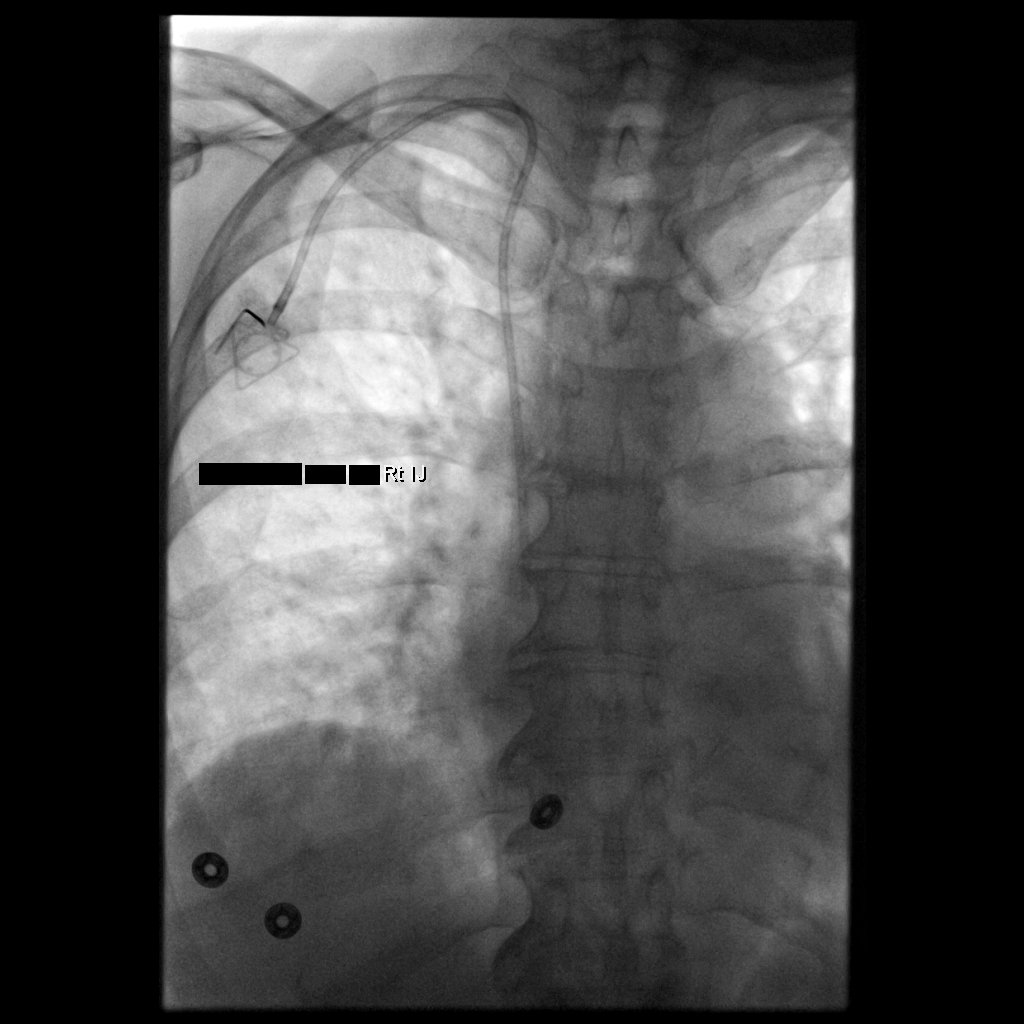

[1 of 1 positions shown; findings below may reference images not displayed]

MEDICATIONS:
Moderate sedation

ANESTHESIA/SEDATION:
Fentanyl 100 mcg. Patient was given fentanyl and Versed immediately
prior to this procedure for the left neck lymph node biopsy.

Moderate Sedation Time:  27 minutes

The patient was continuously monitored during the procedure by the
interventional radiology nurse under my direct supervision.

FLUOROSCOPY TIME:  24 seconds, 2 mGy

COMPLICATIONS:
None immediate.

PROCEDURE:
The procedure, risks, benefits, and alternatives were explained to
the patient. Questions regarding the procedure were encouraged and
answered. The patient understands and consents to the procedure.

Patient was placed supine on the interventional table. Ultrasound
confirmed a patent right internal jugular vein. Ultrasound image was
saved for documentation. The right chest and neck were cleaned with
a skin antiseptic and a sterile drape was placed. Maximal barrier
sterile technique was utilized including caps, mask, sterile gowns,
sterile gloves, sterile drape, hand hygiene and skin antiseptic. The
right neck was anesthetized with 1% lidocaine. Small incision was
made in the right neck with a blade. Micropuncture set was placed in
the right internal jugular vein with ultrasound guidance. The
micropuncture wire was used for measurement purposes. The right
chest was anesthetized with 1% lidocaine with epinephrine. #15 blade
was used to make an incision and a subcutaneous port pocket was
formed. 8 french Power Port was assembled. Subcutaneous tunnel was
formed with a stiff tunneling device. The port catheter was brought
through the subcutaneous tunnel. The port was placed in the
subcutaneous pocket and sutured in place. The micropuncture set was
exchanged for a peel-away sheath. The catheter was placed through
the peel-away sheath and the tip was positioned at the superior
cavoatrial junction. Catheter placement was confirmed with
fluoroscopy. The port was accessed and flushed with heparinized
saline. The port pocket was closed using two layers of absorbable
sutures and Dermabond. The vein skin site was closed using a single
layer of absorbable suture and Dermabond. Sterile dressings were
applied. Patient tolerated the procedure well without an immediate
complication. Ultrasound and fluoroscopic images were taken and
saved for this procedure.
IMPRESSION: Placement of a subcutaneous port device. Catheter tip at the
superior cavoatrial junction.

## 2023-02-07 ENCOUNTER — Inpatient Hospital Stay: Payer: Medicare Other

## 2023-02-08 ENCOUNTER — Inpatient Hospital Stay: Payer: Medicare Other | Attending: Hematology

## 2023-02-08 ENCOUNTER — Inpatient Hospital Stay: Payer: Medicare Other | Admitting: Hematology

## 2023-02-08 ENCOUNTER — Other Ambulatory Visit: Payer: Self-pay

## 2023-02-08 ENCOUNTER — Inpatient Hospital Stay: Payer: Medicare Other

## 2023-02-08 VITALS — BP 136/53 | HR 56 | Temp 98.1°F | Resp 17 | Wt 166.2 lb

## 2023-02-08 DIAGNOSIS — Z5112 Encounter for antineoplastic immunotherapy: Secondary | ICD-10-CM | POA: Diagnosis not present

## 2023-02-08 DIAGNOSIS — Z95828 Presence of other vascular implants and grafts: Secondary | ICD-10-CM

## 2023-02-08 DIAGNOSIS — C184 Malignant neoplasm of transverse colon: Secondary | ICD-10-CM | POA: Insufficient documentation

## 2023-02-08 DIAGNOSIS — C787 Secondary malignant neoplasm of liver and intrahepatic bile duct: Secondary | ICD-10-CM | POA: Insufficient documentation

## 2023-02-08 DIAGNOSIS — Z79899 Other long term (current) drug therapy: Secondary | ICD-10-CM | POA: Diagnosis not present

## 2023-02-08 LAB — CBC WITH DIFFERENTIAL/PLATELET
Abs Immature Granulocytes: 0.03 10*3/uL (ref 0.00–0.07)
Basophils Absolute: 0.1 10*3/uL (ref 0.0–0.1)
Basophils Relative: 1 %
Eosinophils Absolute: 0.2 10*3/uL (ref 0.0–0.5)
Eosinophils Relative: 2 %
HCT: 40.1 % (ref 36.0–46.0)
Hemoglobin: 12.9 g/dL (ref 12.0–15.0)
Immature Granulocytes: 0 %
Lymphocytes Relative: 21 %
Lymphs Abs: 1.5 10*3/uL (ref 0.7–4.0)
MCH: 29.9 pg (ref 26.0–34.0)
MCHC: 32.2 g/dL (ref 30.0–36.0)
MCV: 93 fL (ref 80.0–100.0)
Monocytes Absolute: 1 10*3/uL (ref 0.1–1.0)
Monocytes Relative: 13 %
Neutro Abs: 4.6 10*3/uL (ref 1.7–7.7)
Neutrophils Relative %: 63 %
Platelets: 204 10*3/uL (ref 150–400)
RBC: 4.31 MIL/uL (ref 3.87–5.11)
RDW: 13.2 % (ref 11.5–15.5)
WBC: 7.3 10*3/uL (ref 4.0–10.5)
nRBC: 0 % (ref 0.0–0.2)

## 2023-02-08 LAB — COMPREHENSIVE METABOLIC PANEL
ALT: 23 U/L (ref 0–44)
AST: 26 U/L (ref 15–41)
Albumin: 4.1 g/dL (ref 3.5–5.0)
Alkaline Phosphatase: 71 U/L (ref 38–126)
Anion gap: 8 (ref 5–15)
BUN: 20 mg/dL (ref 8–23)
CO2: 26 mmol/L (ref 22–32)
Calcium: 9.1 mg/dL (ref 8.9–10.3)
Chloride: 100 mmol/L (ref 98–111)
Creatinine, Ser: 0.62 mg/dL (ref 0.44–1.00)
GFR, Estimated: >60 mL/min (ref >60)
Glucose, Bld: 98 mg/dL (ref 70–99)
Potassium: 3.9 mmol/L (ref 3.5–5.1)
Sodium: 134 mmol/L — ABNORMAL LOW (ref 135–145)
Total Bilirubin: 0.4 mg/dL (ref 0.3–1.2)
Total Protein: 8.1 g/dL (ref 6.5–8.1)

## 2023-02-08 LAB — MAGNESIUM: Magnesium: 2.4 mg/dL (ref 1.7–2.4)

## 2023-02-08 MED ORDER — SODIUM CHLORIDE 0.9% FLUSH
10.0000 mL | INTRAVENOUS | Status: DC | PRN
  Administered 2023-02-08: 10 mL

## 2023-02-08 MED ORDER — LIDOCAINE-PRILOCAINE 2.5-2.5 % EX CREA: TOPICAL_CREAM | Freq: Once | CUTANEOUS | 1 refills | Status: AC

## 2023-02-08 MED ORDER — HEPARIN SOD (PORK) LOCK FLUSH 100 UNIT/ML IV SOLN
500.0000 [IU] | Freq: Once | INTRAVENOUS | Status: AC | PRN
  Administered 2023-02-08: 500 [IU]

## 2023-02-08 MED ORDER — SODIUM CHLORIDE 0.9 % IV SOLN
200.0000 mg | Freq: Once | INTRAVENOUS | Status: AC
  Administered 2023-02-08: 200 mg via INTRAVENOUS
  Filled 2023-02-08: qty 8

## 2023-02-08 MED ORDER — SODIUM CHLORIDE 0.9 % IV SOLN: Freq: Once | INTRAVENOUS | Status: AC

## 2023-02-08 NOTE — Progress Notes (Signed)
Patient presents today for Keytruda infusion.  Patient is in satisfactory condition with no new complaints voiced.  Vital signs are stable.   Labs reviewed and all labs are within treatment parameters.  We will proceed with treatment per MD orders.   Patient tolerated treatment well with no complaints voiced.  Patient left ambulatory in stable condition.  Vital signs stable at discharge.  Follow up as scheduled.

## 2023-02-08 NOTE — Patient Instructions (Signed)
[facility]  Discharge Instructions: Thank you for choosing [facility] to provide your oncology and hematology care.  If you have a lab appointment with the Cancer Center - please note that after April 8th, 2024, all labs will be drawn in the cancer center.  You do not have to check in or register with the main entrance as you have in the past but will complete your check-in in the cancer center.  Wear comfortable clothing and clothing appropriate for easy access to any Portacath or PICC line.   We strive to give you quality time with your provider. You may need to reschedule your appointment if you arrive late (15 or more minutes).  Arriving late affects you and other patients whose appointments are after yours.  Also, if you miss three or more appointments without notifying the office, you may be dismissed from the clinic at the provider's discretion.      For prescription refill requests, have your pharmacy contact our office and allow 72 hours for refills to be completed.    Today you received the following chemotherapy and/or immunotherapy agents Keytruda. Pembrolizumab Injection What is this medication? PEMBROLIZUMAB (PEM broe LIZ ue mab) treats some types of cancer. It works by helping your immune system slow or stop the spread of cancer cells. It is a monoclonal antibody. This medicine may be used for other purposes; ask your health care provider or pharmacist if you have questions. COMMON BRAND NAME(S): Keytruda What should I tell my care team before I take this medication? They need to know if you have any of these conditions: Allogeneic stem cell transplant (uses someone else's stem cells) Autoimmune diseases, such as Crohn disease, ulcerative colitis, lupus History of chest radiation Nervous system problems, such as Guillain-Barre syndrome, myasthenia gravis Organ transplant An unusual or allergic reaction to pembrolizumab, other medications,  foods, dyes, or preservatives Pregnant or trying to get pregnant Breast-feeding How should I use this medication? This medication is injected into a vein. It is given by your care team in a hospital or clinic setting. A special MedGuide will be given to you before each treatment. Be sure to read this information carefully each time. Talk to your care team about the use of this medication in children. While it may be prescribed for children as young as 6 months for selected conditions, precautions do apply. Overdosage: If you think you have taken too much of this medicine contact a poison control center or emergency room at once. NOTE: This medicine is only for you. Do not share this medicine with others. What if I miss a dose? Keep appointments for follow-up doses. It is important not to miss your dose. Call your care team if you are unable to keep an appointment. What may interact with this medication? Interactions have not been studied. This list may not describe all possible interactions. Give your health care provider a list of all the medicines, herbs, non-prescription drugs, or dietary supplements you use. Also tell them if you smoke, drink alcohol, or use illegal drugs. Some items may interact with your medicine. What should I watch for while using this medication? Your condition will be monitored carefully while you are receiving this medication. You may need blood work while taking this medication. This medication may cause serious skin reactions. They can happen weeks to months after starting the medication. Contact your care team right away if you notice fevers or flu-like symptoms with a rash. The rash   may be red or purple and then turn into blisters or peeling of the skin. You may also notice a red rash with swelling of the face, lips, or lymph nodes in your neck or under your arms. Tell your care team right away if you have any change in your eyesight. Talk to your care team if you  may be pregnant. Serious birth defects can occur if you take this medication during pregnancy and for 4 months after the last dose. You will need a negative pregnancy test before starting this medication. Contraception is recommended while taking this medication and for 4 months after the last dose. Your care team can help you find the option that works for you. Do not breastfeed while taking this medication and for 4 months after the last dose. What side effects may I notice from receiving this medication? Side effects that you should report to your care team as soon as possible: Allergic reactions--skin rash, itching, hives, swelling of the face, lips, tongue, or throat Dry cough, shortness of breath or trouble breathing Eye pain, redness, irritation, or discharge with blurry or decreased vision Heart muscle inflammation--unusual weakness or fatigue, shortness of breath, chest pain, fast or irregular heartbeat, dizziness, swelling of the ankles, feet, or hands Hormone gland problems--headache, sensitivity to light, unusual weakness or fatigue, dizziness, fast or irregular heartbeat, increased sensitivity to cold or heat, excessive sweating, constipation, hair loss, increased thirst or amount of urine, tremors or shaking, irritability Infusion reactions--chest pain, shortness of breath or trouble breathing, feeling faint or lightheaded Kidney injury (glomerulonephritis)--decrease in the amount of urine, red or dark brown urine, foamy or bubbly urine, swelling of the ankles, hands, or feet Liver injury--right upper belly pain, loss of appetite, nausea, light-colored stool, dark yellow or brown urine, yellowing skin or eyes, unusual weakness or fatigue Pain, tingling, or numbness in the hands or feet, muscle weakness, change in vision, confusion or trouble speaking, loss of balance or coordination, trouble walking, seizures Rash, fever, and swollen lymph nodes Redness, blistering, peeling, or loosening  of the skin, including inside the mouth Sudden or severe stomach pain, bloody diarrhea, fever, nausea, vomiting Side effects that usually do not require medical attention (report to your care team if they continue or are bothersome): Bone, joint, or muscle pain Diarrhea Fatigue Loss of appetite Nausea Skin rash This list may not describe all possible side effects. Call your doctor for medical advice about side effects. You may report side effects to FDA at 1-800-FDA-1088. Where should I keep my medication? This medication is given in a hospital or clinic. It will not be stored at home. NOTE: This sheet is a summary. It may not cover all possible information. If you have questions about this medicine, talk to your doctor, pharmacist, or health care provider.  2024 Elsevier/Gold Standard (2021-11-08 00:00:00)       To help prevent nausea and vomiting after your treatment, we encourage you to take your nausea medication as directed.  BELOW ARE SYMPTOMS THAT SHOULD BE REPORTED IMMEDIATELY: *FEVER GREATER THAN 100.4 F (38 C) OR HIGHER *CHILLS OR SWEATING *NAUSEA AND VOMITING THAT IS NOT CONTROLLED WITH YOUR NAUSEA MEDICATION *UNUSUAL SHORTNESS OF BREATH *UNUSUAL BRUISING OR BLEEDING *URINARY PROBLEMS (pain or burning when urinating, or frequent urination) *BOWEL PROBLEMS (unusual diarrhea, constipation, pain near the anus) TENDERNESS IN MOUTH AND THROAT WITH OR WITHOUT PRESENCE OF ULCERS (sore throat, sores in mouth, or a toothache) UNUSUAL RASH, SWELLING OR PAIN  UNUSUAL VAGINAL DISCHARGE OR ITCHING     Items with * indicate a potential emergency and should be followed up as soon as possible or go to the Emergency Department if any problems should occur.  Please show the CHEMOTHERAPY ALERT CARD or IMMUNOTHERAPY ALERT CARD at check-in to the Emergency Department and triage nurse.  Should you have questions after your visit or need to cancel or reschedule your appointment, please contact  [facility] [phone]  and follow the prompts.  Office hours are 8:00 a.m. to 4:30 p.m. Monday - Friday. Please note that voicemails left after 4:00 p.m. may not be returned until the following business day.  We are closed weekends and major holidays. You have access to a nurse at all times for urgent questions. Please call the main number to the clinic [phone] and follow the prompts.  For any non-urgent questions, you may also contact your provider using MyChart. We now offer e-Visits for anyone 18 and older to request care online for non-urgent symptoms. For details visit [email or web address].   Also download the MyChart app! Go to the app store, search "MyChart", open the app, select [facility], and log in with your MyChart username and password.   

## 2023-02-08 NOTE — Progress Notes (Signed)
Patient presents today for chemotherapy infusion.  Patient is in satisfactory condition with no new complaints voiced.  Vital signs are stable.  Labs reviewed and all labs are within treatment parameters.  We will proceed with treatment per MD orders.

## 2023-02-20 ENCOUNTER — Other Ambulatory Visit: Payer: Self-pay

## 2023-02-22 ENCOUNTER — Inpatient Hospital Stay: Payer: Medicare Other

## 2023-02-22 ENCOUNTER — Inpatient Hospital Stay: Payer: Medicare Other | Admitting: Hematology

## 2023-02-22 ENCOUNTER — Encounter (INDEPENDENT_AMBULATORY_CARE_PROVIDER_SITE_OTHER): Payer: Self-pay | Admitting: *Deleted

## 2023-02-22 ENCOUNTER — Other Ambulatory Visit: Payer: Medicare Other

## 2023-03-01 ENCOUNTER — Ambulatory Visit: Payer: Medicare Other | Admitting: Hematology

## 2023-03-01 ENCOUNTER — Ambulatory Visit: Payer: Medicare Other

## 2023-03-01 ENCOUNTER — Inpatient Hospital Stay: Payer: Medicare Other

## 2023-03-01 ENCOUNTER — Other Ambulatory Visit: Payer: Medicare Other

## 2023-03-01 VITALS — BP 140/69 | HR 64 | Temp 97.8°F | Resp 18 | Wt 167.2 lb

## 2023-03-01 DIAGNOSIS — Z79899 Other long term (current) drug therapy: Secondary | ICD-10-CM | POA: Diagnosis not present

## 2023-03-01 DIAGNOSIS — C189 Malignant neoplasm of colon, unspecified: Secondary | ICD-10-CM

## 2023-03-01 DIAGNOSIS — C787 Secondary malignant neoplasm of liver and intrahepatic bile duct: Secondary | ICD-10-CM | POA: Diagnosis not present

## 2023-03-01 DIAGNOSIS — Z5112 Encounter for antineoplastic immunotherapy: Secondary | ICD-10-CM | POA: Diagnosis not present

## 2023-03-01 DIAGNOSIS — C184 Malignant neoplasm of transverse colon: Secondary | ICD-10-CM | POA: Diagnosis not present

## 2023-03-01 DIAGNOSIS — Z95828 Presence of other vascular implants and grafts: Secondary | ICD-10-CM

## 2023-03-01 LAB — CBC WITH DIFFERENTIAL/PLATELET
Abs Immature Granulocytes: 0.02 10*3/uL (ref 0.00–0.07)
Basophils Absolute: 0.1 10*3/uL (ref 0.0–0.1)
Basophils Relative: 1 %
Eosinophils Absolute: 0.2 10*3/uL (ref 0.0–0.5)
Eosinophils Relative: 3 %
HCT: 40.6 % (ref 36.0–46.0)
Hemoglobin: 13.1 g/dL (ref 12.0–15.0)
Immature Granulocytes: 0 %
Lymphocytes Relative: 20 %
Lymphs Abs: 1.4 10*3/uL (ref 0.7–4.0)
MCH: 30.3 pg (ref 26.0–34.0)
MCHC: 32.3 g/dL (ref 30.0–36.0)
MCV: 94 fL (ref 80.0–100.0)
Monocytes Absolute: 0.9 10*3/uL (ref 0.1–1.0)
Monocytes Relative: 14 %
Neutro Abs: 4.3 10*3/uL (ref 1.7–7.7)
Neutrophils Relative %: 62 %
Platelets: 193 10*3/uL (ref 150–400)
RBC: 4.32 MIL/uL (ref 3.87–5.11)
RDW: 12.9 % (ref 11.5–15.5)
WBC: 6.8 10*3/uL (ref 4.0–10.5)
nRBC: 0 % (ref 0.0–0.2)

## 2023-03-01 LAB — COMPREHENSIVE METABOLIC PANEL
ALT: 25 U/L (ref 0–44)
AST: 26 U/L (ref 15–41)
Albumin: 4 g/dL (ref 3.5–5.0)
Alkaline Phosphatase: 68 U/L (ref 38–126)
Anion gap: 7 (ref 5–15)
BUN: 19 mg/dL (ref 8–23)
CO2: 28 mmol/L (ref 22–32)
Calcium: 9.4 mg/dL (ref 8.9–10.3)
Chloride: 101 mmol/L (ref 98–111)
Creatinine, Ser: 0.66 mg/dL (ref 0.44–1.00)
GFR, Estimated: >60 mL/min (ref >60)
Glucose, Bld: 89 mg/dL (ref 70–99)
Potassium: 3.9 mmol/L (ref 3.5–5.1)
Sodium: 136 mmol/L (ref 135–145)
Total Bilirubin: 0.5 mg/dL (ref 0.3–1.2)
Total Protein: 8.1 g/dL (ref 6.5–8.1)

## 2023-03-01 LAB — MAGNESIUM: Magnesium: 2.2 mg/dL (ref 1.7–2.4)

## 2023-03-01 MED ORDER — HEPARIN SOD (PORK) LOCK FLUSH 100 UNIT/ML IV SOLN
500.0000 [IU] | Freq: Once | INTRAVENOUS | Status: AC | PRN
  Administered 2023-03-01: 500 [IU]

## 2023-03-01 MED ORDER — SODIUM CHLORIDE 0.9% FLUSH
10.0000 mL | INTRAVENOUS | Status: DC | PRN
  Administered 2023-03-01: 10 mL

## 2023-03-01 MED ORDER — SODIUM CHLORIDE 0.9 % IV SOLN: Freq: Once | INTRAVENOUS | Status: AC

## 2023-03-01 MED ORDER — SODIUM CHLORIDE 0.9 % IV SOLN
200.0000 mg | Freq: Once | INTRAVENOUS | Status: AC
  Administered 2023-03-01: 200 mg via INTRAVENOUS
  Filled 2023-03-01: qty 8

## 2023-03-01 NOTE — Progress Notes (Signed)
Patient presents today for chemotherapy/immunotherapy infusion of Keytruda.  Patient is in satisfactory condition with no new complaints voiced.  Vital signs are stable.  Labs reviewed and all labs are within treatment parameters.  We will proceed with treatment per MD orders.    Patient tolerated treatment well with no complaints voiced.  Patient left ambulatory in stable condition.  Vital signs stable at discharge.  Follow up as scheduled.

## 2023-03-01 NOTE — Patient Instructions (Signed)
[facility]  Discharge Instructions: Thank you for choosing [facility] to provide your oncology and hematology care.  If you have a lab appointment with the Cancer Center - please note that after April 8th, 2024, all labs will be drawn in the cancer center.  You do not have to check in or register with the main entrance as you have in the past but will complete your check-in in the cancer center.  Wear comfortable clothing and clothing appropriate for easy access to any Portacath or PICC line.   We strive to give you quality time with your provider. You may need to reschedule your appointment if you arrive late (15 or more minutes).  Arriving late affects you and other patients whose appointments are after yours.  Also, if you miss three or more appointments without notifying the office, you may be dismissed from the clinic at the provider's discretion.      For prescription refill requests, have your pharmacy contact our office and allow 72 hours for refills to be completed.    Today you received the following chemotherapy and/or immunotherapy agents Keytruda. Pembrolizumab Injection What is this medication? PEMBROLIZUMAB (PEM broe LIZ ue mab) treats some types of cancer. It works by helping your immune system slow or stop the spread of cancer cells. It is a monoclonal antibody. This medicine may be used for other purposes; ask your health care provider or pharmacist if you have questions. COMMON BRAND NAME(S): Keytruda What should I tell my care team before I take this medication? They need to know if you have any of these conditions: Allogeneic stem cell transplant (uses someone else's stem cells) Autoimmune diseases, such as Crohn disease, ulcerative colitis, lupus History of chest radiation Nervous system problems, such as Guillain-Barre syndrome, myasthenia gravis Organ transplant An unusual or allergic reaction to pembrolizumab, other medications,  foods, dyes, or preservatives Pregnant or trying to get pregnant Breast-feeding How should I use this medication? This medication is injected into a vein. It is given by your care team in a hospital or clinic setting. A special MedGuide will be given to you before each treatment. Be sure to read this information carefully each time. Talk to your care team about the use of this medication in children. While it may be prescribed for children as young as 6 months for selected conditions, precautions do apply. Overdosage: If you think you have taken too much of this medicine contact a poison control center or emergency room at once. NOTE: This medicine is only for you. Do not share this medicine with others. What if I miss a dose? Keep appointments for follow-up doses. It is important not to miss your dose. Call your care team if you are unable to keep an appointment. What may interact with this medication? Interactions have not been studied. This list may not describe all possible interactions. Give your health care provider a list of all the medicines, herbs, non-prescription drugs, or dietary supplements you use. Also tell them if you smoke, drink alcohol, or use illegal drugs. Some items may interact with your medicine. What should I watch for while using this medication? Your condition will be monitored carefully while you are receiving this medication. You may need blood work while taking this medication. This medication may cause serious skin reactions. They can happen weeks to months after starting the medication. Contact your care team right away if you notice fevers or flu-like symptoms with a rash. The rash   may be red or purple and then turn into blisters or peeling of the skin. You may also notice a red rash with swelling of the face, lips, or lymph nodes in your neck or under your arms. Tell your care team right away if you have any change in your eyesight. Talk to your care team if you  may be pregnant. Serious birth defects can occur if you take this medication during pregnancy and for 4 months after the last dose. You will need a negative pregnancy test before starting this medication. Contraception is recommended while taking this medication and for 4 months after the last dose. Your care team can help you find the option that works for you. Do not breastfeed while taking this medication and for 4 months after the last dose. What side effects may I notice from receiving this medication? Side effects that you should report to your care team as soon as possible: Allergic reactions--skin rash, itching, hives, swelling of the face, lips, tongue, or throat Dry cough, shortness of breath or trouble breathing Eye pain, redness, irritation, or discharge with blurry or decreased vision Heart muscle inflammation--unusual weakness or fatigue, shortness of breath, chest pain, fast or irregular heartbeat, dizziness, swelling of the ankles, feet, or hands Hormone gland problems--headache, sensitivity to light, unusual weakness or fatigue, dizziness, fast or irregular heartbeat, increased sensitivity to cold or heat, excessive sweating, constipation, hair loss, increased thirst or amount of urine, tremors or shaking, irritability Infusion reactions--chest pain, shortness of breath or trouble breathing, feeling faint or lightheaded Kidney injury (glomerulonephritis)--decrease in the amount of urine, red or dark brown urine, foamy or bubbly urine, swelling of the ankles, hands, or feet Liver injury--right upper belly pain, loss of appetite, nausea, light-colored stool, dark yellow or brown urine, yellowing skin or eyes, unusual weakness or fatigue Pain, tingling, or numbness in the hands or feet, muscle weakness, change in vision, confusion or trouble speaking, loss of balance or coordination, trouble walking, seizures Rash, fever, and swollen lymph nodes Redness, blistering, peeling, or loosening  of the skin, including inside the mouth Sudden or severe stomach pain, bloody diarrhea, fever, nausea, vomiting Side effects that usually do not require medical attention (report to your care team if they continue or are bothersome): Bone, joint, or muscle pain Diarrhea Fatigue Loss of appetite Nausea Skin rash This list may not describe all possible side effects. Call your doctor for medical advice about side effects. You may report side effects to FDA at 1-800-FDA-1088. Where should I keep my medication? This medication is given in a hospital or clinic. It will not be stored at home. NOTE: This sheet is a summary. It may not cover all possible information. If you have questions about this medicine, talk to your doctor, pharmacist, or health care provider.  2024 Elsevier/Gold Standard (2021-11-08 00:00:00)       To help prevent nausea and vomiting after your treatment, we encourage you to take your nausea medication as directed.  BELOW ARE SYMPTOMS THAT SHOULD BE REPORTED IMMEDIATELY: *FEVER GREATER THAN 100.4 F (38 C) OR HIGHER *CHILLS OR SWEATING *NAUSEA AND VOMITING THAT IS NOT CONTROLLED WITH YOUR NAUSEA MEDICATION *UNUSUAL SHORTNESS OF BREATH *UNUSUAL BRUISING OR BLEEDING *URINARY PROBLEMS (pain or burning when urinating, or frequent urination) *BOWEL PROBLEMS (unusual diarrhea, constipation, pain near the anus) TENDERNESS IN MOUTH AND THROAT WITH OR WITHOUT PRESENCE OF ULCERS (sore throat, sores in mouth, or a toothache) UNUSUAL RASH, SWELLING OR PAIN  UNUSUAL VAGINAL DISCHARGE OR ITCHING     Items with * indicate a potential emergency and should be followed up as soon as possible or go to the Emergency Department if any problems should occur.  Please show the CHEMOTHERAPY ALERT CARD or IMMUNOTHERAPY ALERT CARD at check-in to the Emergency Department and triage nurse.  Should you have questions after your visit or need to cancel or reschedule your appointment, please contact  [facility] [phone]  and follow the prompts.  Office hours are 8:00 a.m. to 4:30 p.m. Monday - Friday. Please note that voicemails left after 4:00 p.m. may not be returned until the following business day.  We are closed weekends and major holidays. You have access to a nurse at all times for urgent questions. Please call the main number to the clinic [phone] and follow the prompts.  For any non-urgent questions, you may also contact your provider using MyChart. We now offer e-Visits for anyone 18 and older to request care online for non-urgent symptoms. For details visit [email or web address].   Also download the MyChart app! Go to the app store, search "MyChart", open the app, select [facility], and log in with your MyChart username and password.   

## 2023-03-02 LAB — CEA: CEA: 7.3 ng/mL — ABNORMAL HIGH (ref 0.0–4.7)

## 2023-03-15 ENCOUNTER — Ambulatory Visit (HOSPITAL_COMMUNITY)
Admission: RE | Admit: 2023-03-15 | Discharge: 2023-03-15 | Disposition: A | Discharge status: Home / Self Care | Payer: Medicare Other | Source: Ambulatory Visit | Attending: Hematology | Admitting: Hematology

## 2023-03-15 ENCOUNTER — Other Ambulatory Visit: Payer: Self-pay

## 2023-03-15 DIAGNOSIS — C787 Secondary malignant neoplasm of liver and intrahepatic bile duct: Secondary | ICD-10-CM | POA: Insufficient documentation

## 2023-03-15 DIAGNOSIS — R591 Generalized enlarged lymph nodes: Secondary | ICD-10-CM | POA: Diagnosis not present

## 2023-03-15 DIAGNOSIS — K5730 Diverticulosis of large intestine without perforation or abscess without bleeding: Secondary | ICD-10-CM | POA: Diagnosis not present

## 2023-03-15 DIAGNOSIS — C189 Malignant neoplasm of colon, unspecified: Secondary | ICD-10-CM | POA: Diagnosis not present

## 2023-03-15 MED ORDER — IOHEXOL 9 MG/ML PO SOLN
ORAL | Status: AC
  Filled 2023-03-15: qty 1000

## 2023-03-15 MED ORDER — IOHEXOL 300 MG/ML  SOLN
100.0000 mL | Freq: Once | INTRAMUSCULAR | Status: AC | PRN
  Administered 2023-03-15: 100 mL via INTRAVENOUS

## 2023-03-21 ENCOUNTER — Other Ambulatory Visit: Payer: Self-pay

## 2023-03-21 DIAGNOSIS — C189 Malignant neoplasm of colon, unspecified: Secondary | ICD-10-CM

## 2023-03-22 ENCOUNTER — Inpatient Hospital Stay (HOSPITAL_BASED_OUTPATIENT_CLINIC_OR_DEPARTMENT_OTHER): Payer: Medicare Other | Admitting: Hematology

## 2023-03-22 ENCOUNTER — Inpatient Hospital Stay: Payer: Medicare Other | Attending: Hematology

## 2023-03-22 ENCOUNTER — Inpatient Hospital Stay: Payer: Medicare Other

## 2023-03-22 VITALS — BP 127/66 | HR 62 | Temp 97.1°F | Resp 18

## 2023-03-22 DIAGNOSIS — C189 Malignant neoplasm of colon, unspecified: Secondary | ICD-10-CM

## 2023-03-22 DIAGNOSIS — C7800 Secondary malignant neoplasm of unspecified lung: Secondary | ICD-10-CM | POA: Insufficient documentation

## 2023-03-22 DIAGNOSIS — Z95828 Presence of other vascular implants and grafts: Secondary | ICD-10-CM

## 2023-03-22 DIAGNOSIS — Z5112 Encounter for antineoplastic immunotherapy: Secondary | ICD-10-CM | POA: Diagnosis not present

## 2023-03-22 DIAGNOSIS — C787 Secondary malignant neoplasm of liver and intrahepatic bile duct: Secondary | ICD-10-CM

## 2023-03-22 DIAGNOSIS — C184 Malignant neoplasm of transverse colon: Secondary | ICD-10-CM | POA: Insufficient documentation

## 2023-03-22 DIAGNOSIS — C770 Secondary and unspecified malignant neoplasm of lymph nodes of head, face and neck: Secondary | ICD-10-CM | POA: Diagnosis not present

## 2023-03-22 DIAGNOSIS — Z79899 Other long term (current) drug therapy: Secondary | ICD-10-CM | POA: Diagnosis not present

## 2023-03-22 LAB — COMPREHENSIVE METABOLIC PANEL
ALT: 24 U/L (ref 0–44)
AST: 27 U/L (ref 15–41)
Albumin: 4.2 g/dL (ref 3.5–5.0)
Alkaline Phosphatase: 66 U/L (ref 38–126)
Anion gap: 10 (ref 5–15)
BUN: 21 mg/dL (ref 8–23)
CO2: 25 mmol/L (ref 22–32)
Calcium: 9.3 mg/dL (ref 8.9–10.3)
Chloride: 101 mmol/L (ref 98–111)
Creatinine, Ser: 0.65 mg/dL (ref 0.44–1.00)
GFR, Estimated: >60 mL/min (ref >60)
Glucose, Bld: 102 mg/dL — ABNORMAL HIGH (ref 70–99)
Potassium: 4.1 mmol/L (ref 3.5–5.1)
Sodium: 136 mmol/L (ref 135–145)
Total Bilirubin: 0.4 mg/dL (ref 0.3–1.2)
Total Protein: 8.2 g/dL — ABNORMAL HIGH (ref 6.5–8.1)

## 2023-03-22 LAB — CBC WITH DIFFERENTIAL/PLATELET
Abs Immature Granulocytes: 0.02 10*3/uL (ref 0.00–0.07)
Basophils Absolute: 0.1 10*3/uL (ref 0.0–0.1)
Basophils Relative: 1 %
Eosinophils Absolute: 0.2 10*3/uL (ref 0.0–0.5)
Eosinophils Relative: 3 %
HCT: 41.9 % (ref 36.0–46.0)
Hemoglobin: 13.4 g/dL (ref 12.0–15.0)
Immature Granulocytes: 0 %
Lymphocytes Relative: 23 %
Lymphs Abs: 1.6 10*3/uL (ref 0.7–4.0)
MCH: 29.5 pg (ref 26.0–34.0)
MCHC: 32 g/dL (ref 30.0–36.0)
MCV: 92.3 fL (ref 80.0–100.0)
Monocytes Absolute: 0.9 10*3/uL (ref 0.1–1.0)
Monocytes Relative: 13 %
Neutro Abs: 4.2 10*3/uL (ref 1.7–7.7)
Neutrophils Relative %: 60 %
Platelets: 201 10*3/uL (ref 150–400)
RBC: 4.54 MIL/uL (ref 3.87–5.11)
RDW: 12.9 % (ref 11.5–15.5)
WBC: 6.9 10*3/uL (ref 4.0–10.5)
nRBC: 0 % (ref 0.0–0.2)

## 2023-03-22 LAB — MAGNESIUM: Magnesium: 2.3 mg/dL (ref 1.7–2.4)

## 2023-03-22 MED ORDER — SODIUM CHLORIDE 0.9% FLUSH
10.0000 mL | INTRAVENOUS | Status: DC | PRN
  Administered 2023-03-22: 10 mL

## 2023-03-22 MED ORDER — HEPARIN SOD (PORK) LOCK FLUSH 100 UNIT/ML IV SOLN
500.0000 [IU] | Freq: Once | INTRAVENOUS | Status: AC | PRN
  Administered 2023-03-22: 500 [IU]

## 2023-03-22 MED ORDER — SODIUM CHLORIDE 0.9 % IV SOLN: Freq: Once | INTRAVENOUS | Status: AC

## 2023-03-22 MED ORDER — LIDOCAINE-PRILOCAINE 2.5-2.5 % EX CREA: TOPICAL_CREAM | CUTANEOUS | 3 refills | Status: AC

## 2023-03-22 MED ORDER — SODIUM CHLORIDE 0.9 % IV SOLN
400.0000 mg | Freq: Once | INTRAVENOUS | Status: AC
  Administered 2023-03-22: 400 mg via INTRAVENOUS
  Filled 2023-03-22: qty 16

## 2023-03-22 NOTE — Patient Instructions (Signed)
[facility]  Discharge Instructions: Thank you for choosing [facility] to provide your oncology and hematology care.  If you have a lab appointment with the Cancer Center - please note that after April 8th, 2024, all labs will be drawn in the cancer center.  You do not have to check in or register with the main entrance as you have in the past but will complete your check-in in the cancer center.  Wear comfortable clothing and clothing appropriate for easy access to any Portacath or PICC line.   We strive to give you quality time with your provider. You may need to reschedule your appointment if you arrive late (15 or more minutes).  Arriving late affects you and other patients whose appointments are after yours.  Also, if you miss three or more appointments without notifying the office, you may be dismissed from the clinic at the provider's discretion.      For prescription refill requests, have your pharmacy contact our office and allow 72 hours for refills to be completed.    Today you received the following chemotherapy and/or immunotherapy agents Keytruda   To help prevent nausea and vomiting after your treatment, we encourage you to take your nausea medication as directed.  Pembrolizumab Injection What is this medication? PEMBROLIZUMAB (PEM broe LIZ ue mab) treats some types of cancer. It works by helping your immune system slow or stop the spread of cancer cells. It is a monoclonal antibody. This medicine may be used for other purposes; ask your health care provider or pharmacist if you have questions. COMMON BRAND NAME(S): Keytruda What should I tell my care team before I take this medication? They need to know if you have any of these conditions: Allogeneic stem cell transplant (uses someone else's stem cells) Autoimmune diseases, such as Crohn disease, ulcerative colitis, lupus History of chest radiation Nervous system problems, such as  Guillain-Barre syndrome, myasthenia gravis Organ transplant An unusual or allergic reaction to pembrolizumab, other medications, foods, dyes, or preservatives Pregnant or trying to get pregnant Breast-feeding How should I use this medication? This medication is injected into a vein. It is given by your care team in a hospital or clinic setting. A special MedGuide will be given to you before each treatment. Be sure to read this information carefully each time. Talk to your care team about the use of this medication in children. While it may be prescribed for children as young as 6 months for selected conditions, precautions do apply. Overdosage: If you think you have taken too much of this medicine contact a poison control center or emergency room at once. NOTE: This medicine is only for you. Do not share this medicine with others. What if I miss a dose? Keep appointments for follow-up doses. It is important not to miss your dose. Call your care team if you are unable to keep an appointment. What may interact with this medication? Interactions have not been studied. This list may not describe all possible interactions. Give your health care provider a list of all the medicines, herbs, non-prescription drugs, or dietary supplements you use. Also tell them if you smoke, drink alcohol, or use illegal drugs. Some items may interact with your medicine. What should I watch for while using this medication? Your condition will be monitored carefully while you are receiving this medication. You may need blood work while taking this medication. This medication may cause serious skin reactions. They can happen weeks to months   after starting the medication. Contact your care team right away if you notice fevers or flu-like symptoms with a rash. The rash may be red or purple and then turn into blisters or peeling of the skin. You may also notice a red rash with swelling of the face, lips, or lymph nodes in your  neck or under your arms. Tell your care team right away if you have any change in your eyesight. Talk to your care team if you may be pregnant. Serious birth defects can occur if you take this medication during pregnancy and for 4 months after the last dose. You will need a negative pregnancy test before starting this medication. Contraception is recommended while taking this medication and for 4 months after the last dose. Your care team can help you find the option that works for you. Do not breastfeed while taking this medication and for 4 months after the last dose. What side effects may I notice from receiving this medication? Side effects that you should report to your care team as soon as possible: Allergic reactions--skin rash, itching, hives, swelling of the face, lips, tongue, or throat Dry cough, shortness of breath or trouble breathing Eye pain, redness, irritation, or discharge with blurry or decreased vision Heart muscle inflammation--unusual weakness or fatigue, shortness of breath, chest pain, fast or irregular heartbeat, dizziness, swelling of the ankles, feet, or hands Hormone gland problems--headache, sensitivity to light, unusual weakness or fatigue, dizziness, fast or irregular heartbeat, increased sensitivity to cold or heat, excessive sweating, constipation, hair loss, increased thirst or amount of urine, tremors or shaking, irritability Infusion reactions--chest pain, shortness of breath or trouble breathing, feeling faint or lightheaded Kidney injury (glomerulonephritis)--decrease in the amount of urine, red or dark brown urine, foamy or bubbly urine, swelling of the ankles, hands, or feet Liver injury--right upper belly pain, loss of appetite, nausea, light-colored stool, dark yellow or brown urine, yellowing skin or eyes, unusual weakness or fatigue Pain, tingling, or numbness in the hands or feet, muscle weakness, change in vision, confusion or trouble speaking, loss of  balance or coordination, trouble walking, seizures Rash, fever, and swollen lymph nodes Redness, blistering, peeling, or loosening of the skin, including inside the mouth Sudden or severe stomach pain, bloody diarrhea, fever, nausea, vomiting Side effects that usually do not require medical attention (report to your care team if they continue or are bothersome): Bone, joint, or muscle pain Diarrhea Fatigue Loss of appetite Nausea Skin rash This list may not describe all possible side effects. Call your doctor for medical advice about side effects. You may report side effects to FDA at 1-800-FDA-1088. Where should I keep my medication? This medication is given in a hospital or clinic. It will not be stored at home. NOTE: This sheet is a summary. It may not cover all possible information. If you have questions about this medicine, talk to your doctor, pharmacist, or health care provider.  2024 Elsevier/Gold Standard (2021-11-08 00:00:00)   BELOW ARE SYMPTOMS THAT SHOULD BE REPORTED IMMEDIATELY: *FEVER GREATER THAN 100.4 F (38 C) OR HIGHER *CHILLS OR SWEATING *NAUSEA AND VOMITING THAT IS NOT CONTROLLED WITH YOUR NAUSEA MEDICATION *UNUSUAL SHORTNESS OF BREATH *UNUSUAL BRUISING OR BLEEDING *URINARY PROBLEMS (pain or burning when urinating, or frequent urination) *BOWEL PROBLEMS (unusual diarrhea, constipation, pain near the anus) TENDERNESS IN MOUTH AND THROAT WITH OR WITHOUT PRESENCE OF ULCERS (sore throat, sores in mouth, or a toothache) UNUSUAL RASH, SWELLING OR PAIN  UNUSUAL VAGINAL DISCHARGE OR ITCHING   Items   with * indicate a potential emergency and should be followed up as soon as possible or go to the Emergency Department if any problems should occur.  Please show the CHEMOTHERAPY ALERT CARD or IMMUNOTHERAPY ALERT CARD at check-in to the Emergency Department and triage nurse.  Should you have questions after your visit or need to cancel or reschedule your appointment, please  contact [facility] [phone]  and follow the prompts.  Office hours are 8:00 a.m. to 4:30 p.m. Monday - Friday. Please note that voicemails left after 4:00 p.m. may not be returned until the following business day.  We are closed weekends and major holidays. You have access to a nurse at all times for urgent questions. Please call the main number to the clinic [phone] and follow the prompts.  For any non-urgent questions, you may also contact your provider using MyChart. We now offer e-Visits for anyone 18 and older to request care online for non-urgent symptoms. For details visit [email or web address].   Also download the MyChart app! Go to the app store, search "MyChart", open the app, select [place], and log in with your MyChart username and password.   

## 2023-03-22 NOTE — Progress Notes (Signed)
[facility] [address], [place] [address]    Clinic Day:  03/22/23   Referring physician: Elfredia Nevins, MD  Patient Care Team: Elfredia Nevins, MD as PCP - General (Internal Medicine) Franky Macho, MD as Consulting Physician (General Surgery) Doreatha Massed, MD as Medical Oncologist (Medical Oncology)   ASSESSMENT & PLAN:   Assessment: 1.  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 - Status post 3 cycles of FOLFOX from 02/10/2014 through 03/10/2014, discontinued secondary to poor tolerance.  DPD was negative. -Last colonoscopy on 03/28/2018 patent ileo-colonic anastomosis.  8 mm polyp in the transverse colon.  Diverticulosis in the sigmoid colon, descending colon. - CT scan on 11/04/2018 shows postoperative findings of right colectomy with redemonstrated mesenteric nodule/lymph node measuring 1.9 x 1.7 cm, unchanged from prior scan in October 2019.  No evidence of metastatic disease.  Stable solid and groundglass pulmonary nodules, stable over multiple prior exams. -CEA was 4.6 on 10/28/2018. -CTAP on 09/21/2020 showed numerous small pulmonary nodules throughout the lung bases, 5 mm.  Enlarged retrocrural periaortic lymph node in the lower chest.  Soft tissue nodule within the central mesentery substantially increased in size, encasing proximal superior mesenteric artery and measuring 5 x 4.3 cm.  Numerous retroperitoneal lymph nodes, largest aortocaval lymph node measuring 2.8 x 2.2 cm. -CT chest on 09/27/2020 shows a left thoracic inlet lymph node approximately 2.3 cm.  Mediastinal lymph nodes and multiple subcentimeter pulmonary nodules suggestive of metastatic disease. -Mesenteric lymph node biopsy on 09/28/2020 shows mucin and fibrosis. -Left supraclavicular lymph node biopsy on 10/14/2020-soft tissue with abundant  mucin, rare fragments of atypical columnar epithelium. - 3 cycles of FOLFIRI and bevacizumab dose reduced from 11/10/2020 through 12/08/2020. - Caris test-BRAF V600 E+, MMR deficient, MSI high, TMB high, HER2 negative, BRCA1/2 pathogenic variant on exon 14 and exon 9 respectively.  The report also suggested decreased benefit to FOLFOX and bevacizumab in the first-line metastatic setting. - Cycle 1 of Keytruda on 01/13/2021, switch to every 6 weeks regimen on 03/22/2023    Plan: 1.  Metastatic colon cancer to the liver, lungs and lymph nodes, BRAF V600E+: - She does not report any immunotherapy related side effects. - Labs from today: Normal LFTs.  CBC grossly normal.  TSH is 1.6. - Last CEA was 7.3, up from 5.5. - CT CAP on 03/15/2023: Multiple small bilateral lung nodules are stable.  No significant change in enlarged left supraclavicular, mediastinal and retroperitoneal adenopathy.  Slightly diminished size of partially calcified soft tissue mass within the central small bowel mesentery encasing the proximal superior mesenteric artery. - Overall stable disease.  Continue pembrolizumab.  Will change to every 6 weeks for convenience. - Continue pembrolizumab every 6 weeks.  RTC 3 months for follow-up.  Will plan on repeating imaging in 6 months.   2.  Diarrhea: - Baseline diarrhea is stable.  Continue Imodium 2 to 3 tablets/day.    No orders of the defined types were placed in this encounter.     Alben Deeds Teague,acting as a Neurosurgeon for Doreatha Massed, MD.,have documented all relevant documentation on the behalf of Doreatha Massed, MD,as directed by  Doreatha Massed, MD while in the presence of Doreatha Massed, MD.  I, Doreatha Massed MD, have reviewed the above documentation for accuracy and completeness, and I agree with the above.     Doreatha Massed, MD  9/12/20244:12 PM  CHIEF COMPLAINT:   Diagnosis: metastatic transverse colon adenocarcinoma    Cancer  Staging  No matching staging information was found for the patient.    Prior Therapy: 1. Right hemicolectomy on 12/29/2013. 2. FOLFOX x 3 cycles from 02/10/2014 to 03/10/2014, stopped secondary to poor tolerance  Current Therapy:  Keytruda every 6 weeks    HISTORY OF PRESENT ILLNESS:   Oncology History  Adenocarcinoma of transverse colon (HCC) (Resolved)  12/09/2013 Initial Diagnosis   1. Colon, biopsy, proximal transverse mass INVASIVE ADENOCARCINOMA.   12/29/2013 Definitive Surgery   Colon, segmental resection for tumor, right - INVASIVE COLORECTAL ADENOCARCINOMA, 7 CM, EXTENDING INTO PERICOLONIC CONNECTIVE TISSUE. - METASTATIC CARCINOMA IN 6 OF 33 LYMPH NODES (6/33).   02/10/2014 - 03/10/2014 Adjuvant Chemotherapy   FOLFOX x 3 cycles   03/11/2014 Adverse Reaction   Patient called reporting diarrhea despite dose reduction and she wants to stop therapy.  Patient seen on 03/24/14 to discuss other treatment options and she stands by her decision to stop all therapy.   04/01/2014 Surgery   Port-a-cath removal by Dr. Lovell Sheehan.   01/12/2015 PET scan   Mild abdominal mesenteric LAD show hypermetabolic activity, 7 mm indeterminate pulm nodule in sup segment of RLL shows no assoc metabolic activity   03/24/2015 PET scan   Mild progression of nodal mets in anterior mid abdominal mesentery, new nodal mets at L thoracic inlet. stable 6 mm nodule in posterior RLL, unchanged since 2015   06/22/2015 PET scan   Resolution of metabolic activity and decreased size of mild LAD at L thoracic inlet, stable mild mid abd hypermetabolic mesenteric LAD, stable 6 mm posterior LLL pulm nodule without metabolic activity   12/22/2015 Imaging   MRI lumbar spine L4-L5 disc degenerated, broad based disc hernation, B facet arthropathy with hypertophy and edema, stenosis of lateral recesses that could cause neural compression   10/18/2016 Imaging   CT CAP- 1. Several small ground-glass pulmonary nodules are present in  the lungs and are stable over the past 9 months, but several are mildly larger than they were in 2015. Low-grade adenocarcinoma can sometimes have this appearance and surveillance is likely warranted. 2. There is a cluster of nodal tissue in the central mesentery, maximum short axis diameter 1.3 cm. This nodal cluster is relatively low in density and not appreciably changed from the prior exam. Given the lack of change is may represent quiescent residua of malignancy, but again, surveillance is likely warranted. 3. The left-sided rib fractures are more numerous than was revealed on the prior radiographs ; there are nondisplaced healing fractures of the left fifth, sixth, seventh, eighth, ninth, and tenth ribs. 4. Mild cardiomegaly although part of this appearance may be due to pectus excavatum. 5. Several additional small pulmonary nodules are stable from 2015 and considered benign. 6. Right hemicolectomy and sigmoid colon diverticulosis. 7. Small right paraumbilical hernia containing adipose tissue. 8. Lower lumbar spondylosis and degenerative disc disease potentially with mild impingement at L4-5.   04/25/2017 Imaging   CT C/A/P: Scattered solid pulmonary nodules measure up to 6 mm in the right lower lobe, unchanged. There are a few scattered ground-glass nodules measuring up to 8 mm in the right upper lobe, also stable. Distal perigastric lymph nodes are stable. No additional evidence of metastatic disease.   Metastatic colon cancer to liver (HCC)  11/04/2020 Initial Diagnosis   Metastatic colon cancer to liver (HCC)   11/10/2020 - 12/10/2020 Chemotherapy  01/13/2021 - 03/02/2022 Chemotherapy   Patient is on Treatment Plan : COLORECTAL Pembrolizumab q21d     01/13/2021 -  Chemotherapy   Patient is on Treatment Plan : COLORECTAL Pembrolizumab (400) q42 days        INTERVAL HISTORY:   Amanda Norris is a 77 y.o. female presenting to clinic today for follow up of metastatic transverse  colon adenocarcinoma. She was last seen by me on 01/18/23.  Since her last visit, she underwent CT C/A/P on 03/15/23 that found: multiple small bilateral solid and ground-glass pulmonary nodules are stable, or at most minimally diminished in size; no significant change in enlarged left supraclavicular, mediastinal, and retroperitoneal lymphadenopathy; slightly diminished size of a partially calcified soft tissue mass within the central small bowel mesentery, encasing the proximal superior mesenteric artery; constellation of findings is consistent with stable to slightly improved metastatic disease; and unchanged prominent bilateral ovarian and adnexal varices as can be seen in pelvic congestion if referable signs and symptoms are present.  Today, she states that she is doing well overall. Her appetite level is at 100%. Her energy level is at 75 %. She is accompanied by her husband.   She denies any side effects from treatment, including skin rashes, joint pains, SOB, or dry cough. Her diarrhea remains stable and is still treated with Imodium BID. She never received EMLA cream from her pharmacy that was prescribed at her last visit.   PAST MEDICAL HISTORY:   Past Medical History: Past Medical History:  Diagnosis Date   Arthritis    wrist and thumbs   Colon cancer (HCC)    PONV (postoperative nausea and vomiting)    Port-A-Cath in place 11/09/2020   Ruptured lumbar disc    L1-L5 per patient report   Sciatica of left side     Surgical History: Past Surgical History:  Procedure Laterality Date   COLONOSCOPY N/A 12/12/2013   Procedure: COLONOSCOPY;  Surgeon: Malissa Hippo, MD;  Location: [facility];  Service: Endoscopy;  Laterality: N/A;  730   COLONOSCOPY N/A 01/20/2015   Procedure: COLONOSCOPY;  Surgeon: Malissa Hippo, MD;  Location: [facility];  Service: Endoscopy;  Laterality: N/A;  1030   COLONOSCOPY N/A 03/28/2018   Procedure: COLONOSCOPY;  Surgeon: Malissa Hippo, MD;  Location:  [facility];  Service: Endoscopy;  Laterality: N/A;  1015   EUS N/A 12/18/2013   Procedure: UPPER ENDOSCOPIC ULTRASOUND (EUS) LINEAR;  Surgeon: Rachael Fee, MD;  Location: [facility];  Service: Endoscopy;  Laterality: N/A;   herniated disc     [id number]   IR IMAGING GUIDED PORT INSERTION  10/14/2020   IR US GUIDANCE  10/14/2020   PARTIAL COLECTOMY N/A 12/29/2013   Procedure: RIGHT HEMICOLECTOMY;  Surgeon: Dalia Heading, MD;  Location: [facility];  Service: General;  Laterality: N/A;   POLYPECTOMY  03/28/2018   Procedure: POLYPECTOMY;  Surgeon: Malissa Hippo, MD;  Location: [facility];  Service: Endoscopy;;  transverse colon (hot snare);   PORT-A-CATH REMOVAL Right 04/01/2014   Procedure: MINOR REMOVAL PORT-A-CATH;  Surgeon: Dalia Heading, MD;  Location: [facility];  Service: General;  Laterality: Right;   PORTACATH PLACEMENT Right 02/04/2014   Procedure: INSERTION PORT-A-CATH;  Surgeon: Dalia Heading, MD;  Location: [facility];  Service: General;  Laterality: Right;    Social History: Social History   Socioeconomic History   Marital status: Married    Spouse name: Not on file   Number of children: Not on  file   Years of education: Not on file   Highest education level: Not on file  Occupational History   Not on file  Tobacco Use   Smoking status: Former    Current packs/day: 0.00    Average packs/day: 0.3 packs/day for 5.0 years (1.3 ttl pk-yrs)    Types: Cigarettes    Start date: 04/18/1954    Quit date: 04/19/1959    Years since quitting: 63.9   Smokeless tobacco: Never   Tobacco comments:    smoked for 5 years as teenager  Vaping Use   Vaping status: Never Used  Substance and Sexual Activity   Alcohol use: No   Drug use: No   Sexual activity: Yes    Birth control/protection: Post-menopausal  Other Topics Concern   Not on file  Social History Narrative   Not on file   Social Determinants of Health   Financial Resource Strain: Low Risk  (10/04/2020)   Overall  Financial Resource Strain (CARDIA)    Difficulty of Paying Living Expenses: Not hard at all  Food Insecurity: No Food Insecurity (10/04/2020)   Hunger Vital Sign    Worried About Running Out of Food in the Last Year: Never true    Ran Out of Food in the Last Year: Never true  Transportation Needs: No Transportation Needs (10/04/2020)   PRAPARE - Administrator, Civil Service (Medical): No    Lack of Transportation (Non-Medical): No  Physical Activity: Inactive (10/04/2020)   Exercise Vital Sign    Days of Exercise per Week: 0 days    Minutes of Exercise per Session: 0 min  Stress: Stress Concern Present (10/04/2020)   [employer] of Occupational Health - Occupational Stress Questionnaire    Feeling of Stress : To some extent  Social Connections: Socially Integrated (10/04/2020)   Social Connection and Isolation Panel [NHANES]    Frequency of Communication with Friends and Family: More than three times a week    Frequency of Social Gatherings with Friends and Family: More than three times a week    Attends Religious Services: More than 4 times per year    Active Member of [employer] or Organizations: Yes    Attends Engineer, structural: More than 4 times per year    Marital Status: Married  Catering manager Violence: Not At Risk (10/04/2020)   Humiliation, Afraid, Rape, and Kick questionnaire    Fear of Current or Ex-Partner: No    Emotionally Abused: No    Physically Abused: No    Sexually Abused: No    Family History: Family History  Problem Relation Age of Onset   Diabetes type II Mother    Dementia Father     Current Medications:  Current Outpatient Medications:    acetaminophen (TYLENOL) 500 MG tablet, Take 500 mg by mouth every 6 (six) hours as needed for moderate pain or headache., Disp: , Rfl:    aspirin 81 MG EC tablet, Take 1 tablet (81 mg total) by mouth daily. Swallow whole., Disp: 30 tablet, Rfl: 12   Bioflavonoid Products (ESTER C PO), Take  1,000 mg by mouth daily., Disp: , Rfl:    Cholecalciferol (D3-1000) 25 MCG (1000 UT) capsule, Take 125 Units by mouth daily., Disp: , Rfl:    Cranberry-Vitamin C-Vitamin E 4200-20-3 MG-MG-UNIT CAPS, Take 500 mg by mouth daily., Disp: , Rfl:    Garlic 1000 MG CAPS, Take 1,000 mg by mouth daily. , Disp: , Rfl:    Multiple Vitamin (  MULTIVITAMIN) tablet, Take 1 tablet by mouth daily., Disp: , Rfl:    Omega-3 Fatty Acids (FISH OIL) 1200 MG CAPS, Take 1,000 mg by mouth daily., Disp: , Rfl:    OVER THE COUNTER MEDICATION, Take 1,000 mg by mouth daily. Moringa 1000 mg daily, Disp: , Rfl:    OVER THE COUNTER MEDICATION, Take 1,000 mg by mouth daily. L-Lysine 1000mg  po daily, Disp: , Rfl:    pramoxine-hydrocortisone (PROCTOCREAM-HC) 1-1 % rectal cream, Place 1 application rectally 2 (two) times daily., Disp: 30 g, Rfl: 0   zinc gluconate 50 MG tablet, Take 50 mg by mouth daily., Disp: , Rfl:    lidocaine-prilocaine (EMLA) cream, Apply small amount to port a cath site and cover with plastic wrap 1 hour prior to infusion appointments, Disp: 30 g, Rfl: 3 No current facility-administered medications for this visit.  Facility-Administered Medications Ordered in Other Visits:    sodium chloride flush (NS) 0.9 % injection 10 mL, 10 mL, Intracatheter, PRN, Doreatha Massed, MD, 10 mL at 03/22/23 1511   Allergies: Allergies  Allergen Reactions   Sulfa Antibiotics Other (See Comments)    "Sores in my mouth"    REVIEW OF SYSTEMS:   Review of Systems  Constitutional:  Negative for chills, fatigue and fever.  HENT:   Negative for lump/mass, mouth sores, nosebleeds, sore throat and trouble swallowing.   Eyes:  Negative for eye problems.  Respiratory:  Negative for cough and shortness of breath.   Cardiovascular:  Negative for chest pain, leg swelling and palpitations.  Gastrointestinal:  Positive for diarrhea. Negative for abdominal pain, constipation, nausea and vomiting.  Genitourinary:  Negative for  bladder incontinence, difficulty urinating, dysuria, frequency, hematuria and nocturia.   Musculoskeletal:  Negative for arthralgias, back pain, flank pain, myalgias and neck pain.  Skin:  Negative for itching and rash.  Neurological:  Negative for dizziness, headaches and numbness.  Hematological:  Does not bruise/bleed easily.  Psychiatric/Behavioral:  Negative for depression, sleep disturbance and suicidal ideas. The patient is not nervous/anxious.   All other systems reviewed and are negative.    VITALS:   Blood pressure 130/75, pulse 72, temperature 98.6 F (37 C), temperature source Oral, resp. rate 18, weight 166 lb 3.2 oz (75.4 kg), SpO2 98%.  Wt Readings from Last 3 Encounters:  03/22/23 166 lb 3.2 oz (75.4 kg)  03/01/23 167 lb 3.2 oz (75.8 kg)  02/08/23 166 lb 3.2 oz (75.4 kg)    Body mass index is 29.44 kg/m.  Performance status (ECOG): 1 - Symptomatic but completely ambulatory  PHYSICAL EXAM:   Physical Exam Vitals and nursing note reviewed. Exam conducted with a chaperone present.  Constitutional:      Appearance: Normal appearance.  Cardiovascular:     Rate and Rhythm: Normal rate and regular rhythm.     Pulses: Normal pulses.     Heart sounds: Normal heart sounds.  Pulmonary:     Effort: Pulmonary effort is normal.     Breath sounds: Normal breath sounds.  Abdominal:     Palpations: Abdomen is soft. There is no hepatomegaly, splenomegaly or mass.     Tenderness: There is no abdominal tenderness.  Musculoskeletal:     Right lower leg: No edema.     Left lower leg: No edema.  Lymphadenopathy:     Cervical: No cervical adenopathy.     Right cervical: No superficial, deep or posterior cervical adenopathy.    Left cervical: No superficial, deep or posterior cervical adenopathy.  Upper Body:     Right upper body: No supraclavicular or axillary adenopathy.     Left upper body: No supraclavicular or axillary adenopathy.  Neurological:     General: No focal  deficit present.     Mental Status: She is alert and oriented to person, place, and time.  Psychiatric:        Mood and Affect: Mood normal.        Behavior: Behavior normal.     LABS:      Latest Ref Rng & Units 03/22/2023   12:36 PM 03/01/2023   12:35 PM 02/08/2023   12:20 PM  CBC  WBC 4.0 - 10.5 K/uL 6.9  6.8  7.3   Hemoglobin 12.0 - 15.0 g/dL 23.5  57.3  22.0   Hematocrit 36.0 - 46.0 % 41.9  40.6  40.1   Platelets 150 - 400 K/uL 201  193  204       Latest Ref Rng & Units 03/22/2023   12:36 PM 03/01/2023   12:35 PM 02/08/2023   12:20 PM  CMP  Glucose 70 - 99 mg/dL 254  89  98   BUN 8 - 23 mg/dL 21  19  20    Creatinine 0.44 - 1.00 mg/dL 2.70  6.23  7.62   Sodium 135 - 145 mmol/L 136  136  134   Potassium 3.5 - 5.1 mmol/L 4.1  3.9  3.9   Chloride 98 - 111 mmol/L 101  101  100   CO2 22 - 32 mmol/L 25  28  26    Calcium 8.9 - 10.3 mg/dL 9.3  9.4  9.1   Total Protein 6.5 - 8.1 g/dL 8.2  8.1  8.1   Total Bilirubin 0.3 - 1.2 mg/dL 0.4  0.5  0.4   Alkaline Phos 38 - 126 U/L 66  68  71   AST 15 - 41 U/L 27  26  26    ALT 0 - 44 U/L 24  25  23       Lab Results  Component Value Date   CEA1 7.3 (H) 03/01/2023   CEA 7.7 (H) 08/26/2020   /  CEA  Date Value Ref Range Status  03/01/2023 7.3 (H) 0.0 - 4.7 ng/mL Final    Comment:    (NOTE)                             Nonsmokers          <3.9                             Smokers             <5.6 Roche [employer]ostics Electrochemiluminescence Immunoassay (ECLIA) Values obtained with different assay methods or kits cannot be used interchangeably.  Results cannot be interpreted as absolute evidence of the presence or absence of malignant disease. Performed At: [facility] [address] [place], [place] [id number] Jolene Schimke MD WV:[id number]   08/26/2020 7.7 (H) ng/mL Final    Comment:    Non-Smoker: <2.5 Smoker:     <5.0 . . This test was performed using the Siemens  chemiluminescent method. Values obtained  from different assay methods cannot be used interchangeably. CEA levels, regardless of value, should not be interpreted as absolute evidence of the presence or absence of disease. .    No results found for: "PSA1" No results found for: "  WUJ811" No results found for: "CAN125"  No results found for: "TOTALPROTELP", "ALBUMINELP", "A1GS", "A2GS", "BETS", "BETA2SER", "GAMS", "MSPIKE", "SPEI" Lab Results  Component Value Date   FERRITIN 119 10/28/2018   FERRITIN 63 02/17/2014   Lab Results  Component Value Date   LDH 170 10/28/2018   LDH 153 04/25/2018     STUDIES:   CT CHEST ABDOMEN PELVIS W CONTRAST  Result Date: 03/19/2023 CLINICAL DATA:  Metastatic colon cancer restaging, status post right hemicolectomy, ongoing immunotherapy * Tracking Code: BO * EXAM: CT CHEST, ABDOMEN, AND PELVIS WITH CONTRAST TECHNIQUE: Multidetector CT imaging of the chest, abdomen and pelvis was performed following the standard protocol during bolus administration of intravenous contrast. RADIATION DOSE REDUCTION: This exam was performed according to the departmental dose-optimization program which includes automated exposure control, adjustment of the mA and/or kV according to patient size and/or use of iterative reconstruction technique. CONTRAST:  OMNIPAQUE IOHEXOL 300 MG/ML SOLN additional oral enteric contrast COMPARISON:  11/02/2022 FINDINGS: CT CHEST FINDINGS Cardiovascular: Right chest port catheter. Normal heart size. No pericardial effusion. Mediastinum/Nodes: No significant change in enlarged left supraclavicular, pretracheal, and low right paraesophageal lymphadenopathy, supraclavicular node conglomerate measuring 3.3 x 1.8 cm (series 2, image 9). Thyroid gland, trachea, and esophagus demonstrate no significant findings. Lungs/Pleura: Multiple small bilateral solid and ground-glass pulmonary nodules are stable, or at most minimally diminished in size, for example a 0.4 cm nodule in the right lower  lobe, previously 0.6 cm (series 5, image 117), additional nodule in the right lower lobe measuring 0.9 x 0.8 cm, previously 0.9 x 0.9 cm (series 5, image 87). No pleural effusion or pneumothorax. Musculoskeletal: Pectus deformity.  No acute osseous findings. CT ABDOMEN PELVIS FINDINGS Hepatobiliary: No solid liver abnormality is seen. No gallstones, gallbladder wall thickening, or biliary dilatation. Pancreas: Unremarkable. No pancreatic ductal dilatation or surrounding inflammatory changes. Spleen: Normal in size without significant abnormality. Adrenals/Urinary Tract: Adrenal glands are unremarkable. Kidneys are normal, without renal calculi, solid lesion, or hydronephrosis. Bladder is unremarkable. Stomach/Bowel: Stomach is within normal limits. Appendix appears normal. Status post right hemicolectomy and ileocolic anastomosis. No evidence of bowel wall thickening, distention, or inflammatory changes. Sigmoid diverticula. Vascular/Lymphatic: No significant vascular findings are present. No significant change in enlarged retroperitoneal lymph nodes, index left retroperitoneal node measuring 2.3 x 1.5 cm (series 2, image 69). Slightly diminished size of a partially calcified soft tissue mass within the central small bowel mesentery, encasing the proximal superior mesenteric artery, measuring 5.1 x 3.8 cm, previously 5.6 x 4.1 cm (series 2, image 75). Reproductive: No mass or other abnormality. Unchanged prominent bilateral ovarian and adnexal varices. Other: No abdominal wall hernia or abnormality. No ascites. Musculoskeletal: No acute osseous findings. IMPRESSION: 1. Multiple small bilateral solid and ground-glass pulmonary nodules are stable, or at most minimally diminished in size. 2. No significant change in enlarged left supraclavicular, mediastinal, and retroperitoneal lymphadenopathy. 3. Slightly diminished size of a partially calcified soft tissue mass within the central small bowel mesentery, encasing the  proximal superior mesenteric artery. 4. Constellation of findings is consistent with stable to slightly improved metastatic disease. 5. Status post right hemicolectomy and ileocolic anastomosis. 6. Unchanged prominent bilateral ovarian and adnexal varices as can be seen in pelvic congestion if referable signs and symptoms are present. Electronically Signed   By: Jearld Lesch M.D.   On: 03/19/2023 07:11

## 2023-03-22 NOTE — Progress Notes (Signed)
Received order to modify dose of Keytruda to 400 mg every 42 days.  Plan updated and pharmacy has enough drug to accommodate today.  V.O. Dr Carilyn Goodpasture, PharmD

## 2023-03-22 NOTE — Progress Notes (Signed)
Patient presents today for Keytruda infusion. Patient is in satisfactory condition with no new complaints voiced.  Vital signs are stable.  Labs reviewed by Dr. Ellin Saba during the office visit and all labs are within treatment parameters.  We will proceed with treatment per MD orders.   Patient will receive Keytruda 400 mg every 6 weeks starting today per Dr.K.   Treatment given today per MD orders. Tolerated infusion without adverse affects. Vital signs stable. No complaints at this time. Discharged from clinic ambulatory in stable condition. Alert and oriented x 3. F/U with [facility] as scheduled.

## 2023-03-22 NOTE — Patient Instructions (Addendum)
[facility] at [facility] Discharge Instructions   You were seen and examined today by Dr. Ellin Saba.  He reviewed the results of your CT scan which is stable. There is no evidence that the cancer has grown or spread.   He reviewed the results of your lab work which are normal/stable.   We will proceed with your treatment today.  Return as scheduled.   Thank you for choosing [facility] at [facility] to provide your oncology and hematology care.  To afford each patient quality time with our provider, please arrive at least 15 minutes before your scheduled appointment time.   If you have a lab appointment with the Cancer Center please come in thru the Main Entrance and check in at the main information desk.  You need to re-schedule your appointment should you arrive 10 or more minutes late.  We strive to give you quality time with our providers, and arriving late affects you and other patients whose appointments are after yours.  Also, if you no show three or more times for appointments you may be dismissed from the clinic at the providers discretion.     Again, thank you for choosing [facility]  Our hope is that these requests will decrease the amount of time that you wait before being seen by our physicians.       _____________________________________________________________  Should you have questions after your visit to [facility], please contact our office at [phone] and follow the prompts.  Our office hours are 8:00 a.m. and 4:30 p.m. Monday - Friday.  Please note that voicemails left after 4:00 p.m. may not be returned until the following business day.  We are closed weekends and major holidays.  You do have access to a nurse 24-7, just call the main number to the clinic [phone] and do not press any options, hold on the line and a nurse will answer the phone.    For prescription refill requests, have  your pharmacy contact our office and allow 72 hours.    Due to Covid, you will need to wear a mask upon entering the hospital. If you do not have a mask, a mask will be given to you at the Main Entrance upon arrival. For doctor visits, patients may have 1 support person age 22 or older with them. For treatment visits, patients can not have anyone with them due to social distancing guidelines and our immunocompromised population.

## 2023-03-23 ENCOUNTER — Other Ambulatory Visit: Payer: Self-pay

## 2023-03-23 LAB — T4: T4, Total: 9 ug/dL (ref 4.5–12.0)

## 2023-03-25 ENCOUNTER — Other Ambulatory Visit: Payer: Self-pay

## 2023-03-30 DIAGNOSIS — E6609 Other obesity due to excess calories: Secondary | ICD-10-CM | POA: Diagnosis not present

## 2023-03-30 DIAGNOSIS — Z6829 Body mass index (BMI) 29.0-29.9, adult: Secondary | ICD-10-CM | POA: Diagnosis not present

## 2023-03-30 DIAGNOSIS — C189 Malignant neoplasm of colon, unspecified: Secondary | ICD-10-CM | POA: Diagnosis not present

## 2023-03-30 DIAGNOSIS — Z0001 Encounter for general adult medical examination with abnormal findings: Secondary | ICD-10-CM | POA: Diagnosis not present

## 2023-03-30 DIAGNOSIS — K529 Noninfective gastroenteritis and colitis, unspecified: Secondary | ICD-10-CM | POA: Diagnosis not present

## 2023-03-30 DIAGNOSIS — Z1331 Encounter for screening for depression: Secondary | ICD-10-CM | POA: Diagnosis not present

## 2023-04-12 ENCOUNTER — Inpatient Hospital Stay: Payer: Medicare Other | Admitting: Hematology

## 2023-04-12 ENCOUNTER — Inpatient Hospital Stay: Payer: Medicare Other

## 2023-04-17 ENCOUNTER — Other Ambulatory Visit: Payer: Self-pay

## 2023-05-03 ENCOUNTER — Inpatient Hospital Stay: Payer: Medicare Other

## 2023-05-03 ENCOUNTER — Inpatient Hospital Stay: Payer: Medicare Other | Admitting: Hematology

## 2023-05-03 ENCOUNTER — Inpatient Hospital Stay: Payer: Medicare Other | Attending: Hematology

## 2023-05-03 VITALS — BP 153/64 | HR 75 | Temp 97.7°F | Resp 18

## 2023-05-03 DIAGNOSIS — C184 Malignant neoplasm of transverse colon: Secondary | ICD-10-CM | POA: Insufficient documentation

## 2023-05-03 DIAGNOSIS — Z5189 Encounter for other specified aftercare: Secondary | ICD-10-CM | POA: Insufficient documentation

## 2023-05-03 DIAGNOSIS — C7801 Secondary malignant neoplasm of right lung: Secondary | ICD-10-CM | POA: Insufficient documentation

## 2023-05-03 DIAGNOSIS — Z5112 Encounter for antineoplastic immunotherapy: Secondary | ICD-10-CM | POA: Diagnosis not present

## 2023-05-03 DIAGNOSIS — C7802 Secondary malignant neoplasm of left lung: Secondary | ICD-10-CM | POA: Diagnosis not present

## 2023-05-03 DIAGNOSIS — Z95828 Presence of other vascular implants and grafts: Secondary | ICD-10-CM

## 2023-05-03 DIAGNOSIS — C189 Malignant neoplasm of colon, unspecified: Secondary | ICD-10-CM

## 2023-05-03 DIAGNOSIS — C787 Secondary malignant neoplasm of liver and intrahepatic bile duct: Secondary | ICD-10-CM | POA: Insufficient documentation

## 2023-05-03 DIAGNOSIS — C779 Secondary and unspecified malignant neoplasm of lymph node, unspecified: Secondary | ICD-10-CM | POA: Diagnosis not present

## 2023-05-03 LAB — COMPREHENSIVE METABOLIC PANEL
ALT: 22 U/L (ref 0–44)
AST: 26 U/L (ref 15–41)
Albumin: 4.2 g/dL (ref 3.5–5.0)
Alkaline Phosphatase: 59 U/L (ref 38–126)
Anion gap: 9 (ref 5–15)
BUN: 17 mg/dL (ref 8–23)
CO2: 26 mmol/L (ref 22–32)
Calcium: 9.6 mg/dL (ref 8.9–10.3)
Chloride: 101 mmol/L (ref 98–111)
Creatinine, Ser: 0.68 mg/dL (ref 0.44–1.00)
GFR, Estimated: >60 mL/min (ref >60)
Glucose, Bld: 99 mg/dL (ref 70–99)
Potassium: 4.2 mmol/L (ref 3.5–5.1)
Sodium: 136 mmol/L (ref 135–145)
Total Bilirubin: 0.5 mg/dL (ref 0.3–1.2)
Total Protein: 7.9 g/dL (ref 6.5–8.1)

## 2023-05-03 LAB — CBC WITH DIFFERENTIAL/PLATELET
Abs Immature Granulocytes: 0.01 10*3/uL (ref 0.00–0.07)
Basophils Absolute: 0 10*3/uL (ref 0.0–0.1)
Basophils Relative: 1 %
Eosinophils Absolute: 0.1 10*3/uL (ref 0.0–0.5)
Eosinophils Relative: 2 %
HCT: 41 % (ref 36.0–46.0)
Hemoglobin: 13.3 g/dL (ref 12.0–15.0)
Immature Granulocytes: 0 %
Lymphocytes Relative: 24 %
Lymphs Abs: 1.6 10*3/uL (ref 0.7–4.0)
MCH: 29.8 pg (ref 26.0–34.0)
MCHC: 32.4 g/dL (ref 30.0–36.0)
MCV: 91.9 fL (ref 80.0–100.0)
Monocytes Absolute: 0.8 10*3/uL (ref 0.1–1.0)
Monocytes Relative: 12 %
Neutro Abs: 4 10*3/uL (ref 1.7–7.7)
Neutrophils Relative %: 61 %
Platelets: 184 10*3/uL (ref 150–400)
RBC: 4.46 MIL/uL (ref 3.87–5.11)
RDW: 13.2 % (ref 11.5–15.5)
WBC: 6.6 10*3/uL (ref 4.0–10.5)
nRBC: 0 % (ref 0.0–0.2)

## 2023-05-03 LAB — MAGNESIUM: Magnesium: 2.2 mg/dL (ref 1.7–2.4)

## 2023-05-03 MED ORDER — SODIUM CHLORIDE 0.9 % IV SOLN
400.0000 mg | Freq: Once | INTRAVENOUS | Status: AC
  Administered 2023-05-03: 400 mg via INTRAVENOUS
  Filled 2023-05-03: qty 16

## 2023-05-03 MED ORDER — HEPARIN SOD (PORK) LOCK FLUSH 100 UNIT/ML IV SOLN
500.0000 [IU] | Freq: Once | INTRAVENOUS | Status: AC | PRN
  Administered 2023-05-03: 500 [IU]

## 2023-05-03 MED ORDER — SODIUM CHLORIDE 0.9% FLUSH
10.0000 mL | INTRAVENOUS | Status: DC | PRN
  Administered 2023-05-03: 10 mL

## 2023-05-03 MED ORDER — SODIUM CHLORIDE 0.9 % IV SOLN: Freq: Once | INTRAVENOUS | Status: AC

## 2023-05-03 NOTE — Patient Instructions (Signed)
[facility] [place]  Discharge Instructions: Thank you for choosing [facility] to provide your oncology and hematology care.  If you have a lab appointment with the Cancer Center - please note that after April 8th, 2024, all labs will be drawn in the cancer center.  You do not have to check in or register with the main entrance as you have in the past but will complete your check-in in the cancer center.  Wear comfortable clothing and clothing appropriate for easy access to any Portacath or PICC line.   We strive to give you quality time with your provider. You may need to reschedule your appointment if you arrive late (15 or more minutes).  Arriving late affects you and other patients whose appointments are after yours.  Also, if you miss three or more appointments without notifying the office, you may be dismissed from the clinic at the provider's discretion.      For prescription refill requests, have your pharmacy contact our office and allow 72 hours for refills to be completed.    Today you received the following chemotherapy and/or immunotherapy agents Keytruda      To help prevent nausea and vomiting after your treatment, we encourage you to take your nausea medication as directed.  BELOW ARE SYMPTOMS THAT SHOULD BE REPORTED IMMEDIATELY: *FEVER GREATER THAN 100.4 F (38 C) OR HIGHER *CHILLS OR SWEATING *NAUSEA AND VOMITING THAT IS NOT CONTROLLED WITH YOUR NAUSEA MEDICATION *UNUSUAL SHORTNESS OF BREATH *UNUSUAL BRUISING OR BLEEDING *URINARY PROBLEMS (pain or burning when urinating, or frequent urination) *BOWEL PROBLEMS (unusual diarrhea, constipation, pain near the anus) TENDERNESS IN MOUTH AND THROAT WITH OR WITHOUT PRESENCE OF ULCERS (sore throat, sores in mouth, or a toothache) UNUSUAL RASH, SWELLING OR PAIN  UNUSUAL VAGINAL DISCHARGE OR ITCHING   Items with * indicate a potential emergency and should be followed up as soon as possible or go to the  Emergency Department if any problems should occur.  Please show the CHEMOTHERAPY ALERT CARD or IMMUNOTHERAPY ALERT CARD at check-in to the Emergency Department and triage nurse.  Should you have questions after your visit or need to cancel or reschedule your appointment, please contact [facility] [place] [phone]  and follow the prompts.  Office hours are 8:00 a.m. to 4:30 p.m. Monday - Friday. Please note that voicemails left after 4:00 p.m. may not be returned until the following business day.  We are closed weekends and major holidays. You have access to a nurse at all times for urgent questions. Please call the main number to the clinic [phone] and follow the prompts.  For any non-urgent questions, you may also contact your provider using MyChart. We now offer e-Visits for anyone 18 and older to request care online for non-urgent symptoms. For details visit [email or web address].   Also download the MyChart app! Go to the app store, search "MyChart", open the app, select [place], and log in with your MyChart username and password.   

## 2023-05-03 NOTE — Progress Notes (Signed)
Patient presents today for Keytruda infusion per providers order.  Vital signs and labs within parameters for treatment.  Patient has no new complaints at this time.  Stable during infusion without adverse affects.  Vital signs stable.  No complaints at this time.  Discharge from clinic ambulatory in stable condition.  Alert and oriented X 3.  Follow up with [facility] as scheduled.

## 2023-05-18 DIAGNOSIS — H35361 Drusen (degenerative) of macula, right eye: Secondary | ICD-10-CM | POA: Diagnosis not present

## 2023-05-18 DIAGNOSIS — H04123 Dry eye syndrome of bilateral lacrimal glands: Secondary | ICD-10-CM | POA: Diagnosis not present

## 2023-05-18 DIAGNOSIS — H26493 Other secondary cataract, bilateral: Secondary | ICD-10-CM | POA: Diagnosis not present

## 2023-05-31 DIAGNOSIS — M5412 Radiculopathy, cervical region: Secondary | ICD-10-CM | POA: Diagnosis not present

## 2023-06-08 DIAGNOSIS — M5412 Radiculopathy, cervical region: Secondary | ICD-10-CM | POA: Diagnosis not present

## 2023-06-12 ENCOUNTER — Other Ambulatory Visit: Payer: Self-pay

## 2023-06-12 NOTE — Progress Notes (Signed)
[facility] [address], [place] [address]    Clinic Day:  06/14/23   Referring physician: Elfredia Nevins, MD  Patient Care Team: Elfredia Nevins, MD as PCP - General (Internal Medicine) Franky Macho, MD as Consulting Physician (General Surgery) Doreatha Massed, MD as Medical Oncologist (Medical Oncology)   ASSESSMENT & PLAN:   Assessment: 1.  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 - Status post 3 cycles of FOLFOX from 02/10/2014 through 03/10/2014, discontinued secondary to poor tolerance.  DPD was negative. -Last colonoscopy on 03/28/2018 patent ileo-colonic anastomosis.  8 mm polyp in the transverse colon.  Diverticulosis in the sigmoid colon, descending colon. - CT scan on 11/04/2018 shows postoperative findings of right colectomy with redemonstrated mesenteric nodule/lymph node measuring 1.9 x 1.7 cm, unchanged from prior scan in October 2019.  No evidence of metastatic disease.  Stable solid and groundglass pulmonary nodules, stable over multiple prior exams. -CEA was 4.6 on 10/28/2018. -CTAP on 09/21/2020 showed numerous small pulmonary nodules throughout the lung bases, 5 mm.  Enlarged retrocrural periaortic lymph node in the lower chest.  Soft tissue nodule within the central mesentery substantially increased in size, encasing proximal superior mesenteric artery and measuring 5 x 4.3 cm.  Numerous retroperitoneal lymph nodes, largest aortocaval lymph node measuring 2.8 x 2.2 cm. -CT chest on 09/27/2020 shows a left thoracic inlet lymph node approximately 2.3 cm.  Mediastinal lymph nodes and multiple subcentimeter pulmonary nodules suggestive of metastatic disease. -Mesenteric lymph node biopsy on 09/28/2020 shows mucin and fibrosis. -Left supraclavicular lymph node biopsy on 10/14/2020-soft tissue with abundant  mucin, rare fragments of atypical columnar epithelium. - 3 cycles of FOLFIRI and bevacizumab dose reduced from 11/10/2020 through 12/08/2020. - Caris test-BRAF V600 E+, MMR deficient, MSI high, TMB high, HER2 negative, BRCA1/2 pathogenic variant on exon 14 and exon 9 respectively.  The report also suggested decreased benefit to FOLFOX and bevacizumab in the first-line metastatic setting. - Cycle 1 of Keytruda on 01/13/2021, switch to every 6 weeks regimen on 03/22/2023    Plan: 1.  Metastatic colon cancer to the liver, lungs and lymph nodes, BRAF V600E+: - CT CAP on 03/15/2023: Multiple small bilateral lung nodules stable.  No significant change in enlarging left supraclavicular, mediastinal and retroperitoneal adenopathy.  Slightly diminished size of partially calcified soft tissue masses. - Denies any immunotherapy related side effects. - Reviewed labs today: Normal LFTs and CBC.  Last TSH was within normal limits.  Last CEA was 7.3.  Will check another CEA at next visit. - RTC 12 weeks for follow-up.  Repeat CT CAP with contrast, CEA and labs.   2.  Diarrhea: - Baseline diarrhea is stable.  Continue Imodium 2 to 3 tablets daily as needed.    Orders Placed This Encounter  Procedures   CT CHEST ABDOMEN PELVIS W CONTRAST    Standing Status:   Future    Standing Expiration Date:   06/13/2024    Order Specific Question:   If indicated for the ordered procedure, I authorize the administration of contrast media per Radiology protocol    Answer:   Yes    Order Specific Question:   Does the patient have a contrast media/X-ray dye allergy?    Answer:   No    Order Specific Question:   Preferred imaging location?    Answer:   [facility]  Order Specific Question:   If indicated for the ordered procedure, I authorize the administration of oral contrast media per Radiology protocol    Answer:   Yes   CEA    Standing Status:   Future    Standing Expiration Date:   09/05/2024   CEA    Standing  Status:   Future    Standing Expiration Date:   06/13/2024   CEA    Standing Status:   Future    Standing Expiration Date:   11/28/2024      Mikeal Hawthorne R Teague,acting as a scribe for Doreatha Massed, MD.,have documented all relevant documentation on the behalf of Doreatha Massed, MD,as directed by  Doreatha Massed, MD while in the presence of Doreatha Massed, MD.  I, Doreatha Massed MD, have reviewed the above documentation for accuracy and completeness, and I agree with the above.      Doreatha Massed, MD   12/5/20242:01 PM  CHIEF COMPLAINT:   Diagnosis: metastatic transverse colon adenocarcinoma    Cancer Staging  No matching staging information was found for the patient.    Prior Therapy: 1. Right hemicolectomy on 12/29/2013. 2. FOLFOX x 3 cycles from 02/10/2014 to 03/10/2014, stopped secondary to poor tolerance  Current Therapy:  Keytruda every 6 weeks    HISTORY OF PRESENT ILLNESS:   Oncology History  Adenocarcinoma of transverse colon (HCC) (Resolved)  12/09/2013 Initial Diagnosis   1. Colon, biopsy, proximal transverse mass INVASIVE ADENOCARCINOMA.   12/29/2013 Definitive Surgery   Colon, segmental resection for tumor, right - INVASIVE COLORECTAL ADENOCARCINOMA, 7 CM, EXTENDING INTO PERICOLONIC CONNECTIVE TISSUE. - METASTATIC CARCINOMA IN 6 OF 33 LYMPH NODES (6/33).   02/10/2014 - 03/10/2014 Adjuvant Chemotherapy   FOLFOX x 3 cycles   03/11/2014 Adverse Reaction   Patient called reporting diarrhea despite dose reduction and she wants to stop therapy.  Patient seen on 03/24/14 to discuss other treatment options and she stands by her decision to stop all therapy.   04/01/2014 Surgery   Port-a-cath removal by Dr. Lovell Sheehan.   01/12/2015 PET scan   Mild abdominal mesenteric LAD show hypermetabolic activity, 7 mm indeterminate pulm nodule in sup segment of RLL shows no assoc metabolic activity   03/24/2015 PET scan   Mild progression of nodal mets in  anterior mid abdominal mesentery, new nodal mets at L thoracic inlet. stable 6 mm nodule in posterior RLL, unchanged since 2015   06/22/2015 PET scan   Resolution of metabolic activity and decreased size of mild LAD at L thoracic inlet, stable mild mid abd hypermetabolic mesenteric LAD, stable 6 mm posterior LLL pulm nodule without metabolic activity   12/22/2015 Imaging   MRI lumbar spine L4-L5 disc degenerated, broad based disc hernation, B facet arthropathy with hypertophy and edema, stenosis of lateral recesses that could cause neural compression   10/18/2016 Imaging   CT CAP- 1. Several small ground-glass pulmonary nodules are present in the lungs and are stable over the past 9 months, but several are mildly larger than they were in 2015. Low-grade adenocarcinoma can sometimes have this appearance and surveillance is likely warranted. 2. There is a cluster of nodal tissue in the central mesentery, maximum short axis diameter 1.3 cm. This nodal cluster is relatively low in density and not appreciably changed from the prior exam. Given the lack of change is may represent quiescent residua of malignancy, but again, surveillance is likely warranted. 3. The left-sided rib fractures are more numerous than was revealed on the prior radiographs ;  there are nondisplaced healing fractures of the left fifth, sixth, seventh, eighth, ninth, and tenth ribs. 4. Mild cardiomegaly although part of this appearance may be due to pectus excavatum. 5. Several additional small pulmonary nodules are stable from 2015 and considered benign. 6. Right hemicolectomy and sigmoid colon diverticulosis. 7. Small right paraumbilical hernia containing adipose tissue. 8. Lower lumbar spondylosis and degenerative disc disease potentially with mild impingement at L4-5.   04/25/2017 Imaging   CT C/A/P: Scattered solid pulmonary nodules measure up to 6 mm in the right lower lobe, unchanged. There are a few scattered  ground-glass nodules measuring up to 8 mm in the right upper lobe, also stable. Distal perigastric lymph nodes are stable. No additional evidence of metastatic disease.   Metastatic colon cancer to liver (HCC)  11/04/2020 Initial Diagnosis   Metastatic colon cancer to liver (HCC)   11/10/2020 - 12/10/2020 Chemotherapy         01/13/2021 - 03/02/2022 Chemotherapy   Patient is on Treatment Plan : COLORECTAL Pembrolizumab q21d     01/13/2021 -  Chemotherapy   Patient is on Treatment Plan : COLORECTAL Pembrolizumab (400) q42 days        INTERVAL HISTORY:   Amanda Norris is a 77 y.o. female presenting to clinic today for follow up of metastatic transverse colon adenocarcinoma. She was last seen by me on 03/22/23.  Today, she states that she is doing well overall. Her appetite level is at 100%. Her energy level is at 75%. She is accompanied by her husband. She reports she had a pinched neve in her back that has been resolved. She occasionally has crusting skin patches that she treats effectively with lotion. Diarrhea is at baseline and she is taking Imodium prn.   PAST MEDICAL HISTORY:   Past Medical History: Past Medical History:  Diagnosis Date   Arthritis    wrist and thumbs   Colon cancer (HCC)    PONV (postoperative nausea and vomiting)    Port-A-Cath in place 11/09/2020   Ruptured lumbar disc    L1-L5 per patient report   Sciatica of left side     Surgical History: Past Surgical History:  Procedure Laterality Date   COLONOSCOPY N/A 12/12/2013   Procedure: COLONOSCOPY;  Surgeon: Malissa Hippo, MD;  [facility]: [facility];  Service: Endoscopy;  Laterality: N/A;  730   COLONOSCOPY N/A 01/20/2015   Procedure: COLONOSCOPY;  Surgeon: Malissa Hippo, MD;  [facility]: [facility];  Service: Endoscopy;  Laterality: N/A;  1030   COLONOSCOPY N/A 03/28/2018   Procedure: COLONOSCOPY;  Surgeon: Malissa Hippo, MD;  Location: [facility];  Service: Endoscopy;  Laterality: N/A;  1015   EUS N/A  12/18/2013   Procedure: UPPER ENDOSCOPIC ULTRASOUND (EUS) LINEAR;  Surgeon: Rachael Fee, MD;  Location: [facility];  Service: Endoscopy;  Laterality: N/A;   herniated disc     [id number]   IR IMAGING GUIDED PORT INSERTION  10/14/2020   IR US GUIDANCE  10/14/2020   PARTIAL COLECTOMY N/A 12/29/2013   Procedure: RIGHT HEMICOLECTOMY;  Surgeon: Dalia Heading, MD;  Location: [facility];  Service: General;  Laterality: N/A;   POLYPECTOMY  03/28/2018   Procedure: POLYPECTOMY;  Surgeon: Malissa Hippo, MD;  Location: [facility];  Service: Endoscopy;;  transverse colon (hot snare);   PORT-A-CATH REMOVAL Right 04/01/2014   Procedure: MINOR REMOVAL PORT-A-CATH;  Surgeon: Dalia Heading, MD;  Location: [facility];  Service: General;  Laterality: Right;   PORTACATH PLACEMENT  Right 02/04/2014   Procedure: INSERTION PORT-A-CATH;  Surgeon: Dalia Heading, MD;  Location: [facility];  Service: General;  Laterality: Right;    Social History: Social History   Socioeconomic History   Marital status: Married    Spouse name: Not on file   Number of children: Not on file   Years of education: Not on file   Highest education level: Not on file  Occupational History   Not on file  Tobacco Use   Smoking status: Former    Current packs/day: 0.00    Average packs/day: 0.3 packs/day for 5.0 years (1.3 ttl pk-yrs)    Types: Cigarettes    Start date: 04/18/1954    Quit date: 04/19/1959    Years since quitting: 64.1   Smokeless tobacco: Never   Tobacco comments:    smoked for 5 years as teenager  Vaping Use   Vaping status: Never Used  Substance and Sexual Activity   Alcohol use: No   Drug use: No   Sexual activity: Yes    Birth control/protection: Post-menopausal  Other Topics Concern   Not on file  Social History Narrative   Not on file   Social Determinants of Health   Financial Resource Strain: Low Risk  (10/04/2020)   Overall Financial Resource Strain (CARDIA)    Difficulty of Paying Living Expenses:  Not hard at all  Food Insecurity: No Food Insecurity (10/04/2020)   Hunger Vital Sign    Worried About Running Out of Food in the Last Year: Never true    Ran Out of Food in the Last Year: Never true  Transportation Needs: No Transportation Needs (10/04/2020)   PRAPARE - Administrator, Civil Service (Medical): No    Lack of Transportation (Non-Medical): No  Physical Activity: Inactive (10/04/2020)   Exercise Vital Sign    Days of Exercise per Week: 0 days    Minutes of Exercise per Session: 0 min  Stress: Stress Concern Present (10/04/2020)   [employer] of Occupational Health - Occupational Stress Questionnaire    Feeling of Stress : To some extent  Social Connections: Socially Integrated (10/04/2020)   Social Connection and Isolation Panel [NHANES]    Frequency of Communication with Friends and Family: More than three times a week    Frequency of Social Gatherings with Friends and Family: More than three times a week    Attends Religious Services: More than 4 times per year    Active Member of [employer] or Organizations: Yes    Attends Engineer, structural: More than 4 times per year    Marital Status: Married  Catering manager Violence: Not At Risk (10/04/2020)   Humiliation, Afraid, Rape, and Kick questionnaire    Fear of Current or Ex-Partner: No    Emotionally Abused: No    Physically Abused: No    Sexually Abused: No    Family History: Family History  Problem Relation Age of Onset   Diabetes type II Mother    Dementia Father     Current Medications:  Current Outpatient Medications:    acetaminophen (TYLENOL) 500 MG tablet, Take 500 mg by mouth every 6 (six) hours as needed for moderate pain or headache., Disp: , Rfl:    aspirin 81 MG EC tablet, Take 1 tablet (81 mg total) by mouth daily. Swallow whole., Disp: 30 tablet, Rfl: 12   Bioflavonoid Products (ESTER C PO), Take 1,000 mg by mouth daily., Disp: , Rfl:    Cholecalciferol (D3-1000) 25  MCG  (1000 UT) capsule, Take 125 Units by mouth daily., Disp: , Rfl:    Cranberry-Vitamin C-Vitamin E 4200-20-3 MG-MG-UNIT CAPS, Take 500 mg by mouth daily., Disp: , Rfl:    Garlic 1000 MG CAPS, Take 1,000 mg by mouth daily. , Disp: , Rfl:    lidocaine-prilocaine (EMLA) cream, Apply small amount to port a cath site and cover with plastic wrap 1 hour prior to infusion appointments, Disp: 30 g, Rfl: 3   Multiple Vitamin (MULTIVITAMIN) tablet, Take 1 tablet by mouth daily., Disp: , Rfl:    OVER THE COUNTER MEDICATION, Take 1,000 mg by mouth daily. Moringa 1000 mg daily, Disp: , Rfl:    zinc gluconate 50 MG tablet, Take 50 mg by mouth daily., Disp: , Rfl:  No current facility-administered medications for this visit.  Facility-Administered Medications Ordered in Other Visits:    0.9 %  sodium chloride infusion, , Intravenous, Once, Doreatha Massed, MD   heparin lock flush 100 unit/mL, 500 Units, Intracatheter, Once PRN, Doreatha Massed, MD   pembrolizumab [facility]) 400 mg in sodium chloride 0.9 % 50 mL chemo infusion, 400 mg, Intravenous, Once, Doreatha Massed, MD   sodium chloride flush (NS) 0.9 % injection 10 mL, 10 mL, Intracatheter, PRN, Doreatha Massed, MD   Allergies: Allergies  Allergen Reactions   Sulfa Antibiotics Other (See Comments)    "Sores in my mouth"    REVIEW OF SYSTEMS:   Review of Systems  Constitutional:  Negative for chills, fatigue and fever.  HENT:   Negative for lump/mass, mouth sores, nosebleeds, sore throat and trouble swallowing.   Eyes:  Negative for eye problems.  Respiratory:  Positive for cough. Negative for shortness of breath.   Cardiovascular:  Negative for chest pain, leg swelling and palpitations.  Gastrointestinal:  Positive for diarrhea. Negative for abdominal pain, constipation, nausea and vomiting.  Genitourinary:  Negative for bladder incontinence, difficulty urinating, dysuria, frequency, hematuria and nocturia.   Musculoskeletal:   Negative for arthralgias, back pain, flank pain, myalgias and neck pain.  Skin:  Negative for itching and rash.  Neurological:  Positive for dizziness. Negative for headaches and numbness.  Hematological:  Does not bruise/bleed easily.  Psychiatric/Behavioral:  Negative for depression, sleep disturbance and suicidal ideas. The patient is not nervous/anxious.   All other systems reviewed and are negative.    VITALS:   Blood pressure (!) 158/70, pulse 66, temperature 98.1 F (36.7 C), temperature source Oral, resp. rate 16, weight 170 lb 9.6 oz (77.4 kg), SpO2 99%.  Wt Readings from Last 3 Encounters:  06/14/23 170 lb 9.6 oz (77.4 kg)  03/22/23 166 lb 3.2 oz (75.4 kg)  03/01/23 167 lb 3.2 oz (75.8 kg)    Body mass index is 30.22 kg/m.  Performance status (ECOG): 1 - Symptomatic but completely ambulatory  PHYSICAL EXAM:   Physical Exam Vitals and nursing note reviewed. Exam conducted with a chaperone present.  Constitutional:      Appearance: Normal appearance.  Cardiovascular:     Rate and Rhythm: Normal rate and regular rhythm.     Pulses: Normal pulses.     Heart sounds: Normal heart sounds.  Pulmonary:     Effort: Pulmonary effort is normal.     Breath sounds: Normal breath sounds.  Abdominal:     Palpations: Abdomen is soft. There is no hepatomegaly, splenomegaly or mass.     Tenderness: There is no abdominal tenderness.  Musculoskeletal:     Right lower leg: No edema.  Left lower leg: No edema.  Lymphadenopathy:     Cervical: No cervical adenopathy.     Right cervical: No superficial, deep or posterior cervical adenopathy.    Left cervical: No superficial, deep or posterior cervical adenopathy.     Upper Body:     Right upper body: No supraclavicular or axillary adenopathy.     Left upper body: No supraclavicular or axillary adenopathy.  Neurological:     General: No focal deficit present.     Mental Status: She is alert and oriented to person, place, and time.   Psychiatric:        Mood and Affect: Mood normal.        Behavior: Behavior normal.     LABS:      Latest Ref Rng & Units 06/14/2023   12:02 PM 05/03/2023   12:51 PM 03/22/2023   12:36 PM  CBC  WBC 4.0 - 10.5 K/uL 6.9  6.6  6.9   Hemoglobin 12.0 - 15.0 g/dL 16.1  09.6  04.5   Hematocrit 36.0 - 46.0 % 42.5  41.0  41.9   Platelets 150 - 400 K/uL 174  184  201       Latest Ref Rng & Units 06/14/2023   12:02 PM 05/03/2023   12:51 PM 03/22/2023   12:36 PM  CMP  Glucose 70 - 99 mg/dL 98  99  409   BUN 8 - 23 mg/dL 22  17  21    Creatinine 0.44 - 1.00 mg/dL 8.11  9.14  7.82   Sodium 135 - 145 mmol/L 136  136  136   Potassium 3.5 - 5.1 mmol/L 4.5  4.2  4.1   Chloride 98 - 111 mmol/L 103  101  101   CO2 22 - 32 mmol/L 25  26  25    Calcium 8.9 - 10.3 mg/dL 9.7  9.6  9.3   Total Protein 6.5 - 8.1 g/dL 7.8  7.9  8.2   Total Bilirubin <1.2 mg/dL 0.5  0.5  0.4   Alkaline Phos 38 - 126 U/L 54  59  66   AST 15 - 41 U/L 25  26  27    ALT 0 - 44 U/L 23  22  24       Lab Results  Component Value Date   CEA1 7.3 (H) 03/01/2023   CEA 7.7 (H) 08/26/2020   /  CEA  Date Value Ref Range Status  03/01/2023 7.3 (H) 0.0 - 4.7 ng/mL Final    Comment:    (NOTE)                             Nonsmokers          <3.9                             Smokers             <5.6 Roche Diagnostics Electrochemiluminescence Immunoassay (ECLIA) Values obtained with different assay methods or kits cannot be used interchangeably.  Results cannot be interpreted as absolute evidence of the presence or absence of malignant disease. Performed At: [facility] [address] [place], [place] [id number] Jolene Schimke MD VH:[id number]   08/26/2020 7.7 (H) ng/mL Final    Comment:    Non-Smoker: <2.5 Smoker:     <5.0 . . This test was performed using the Siemens  chemiluminescent method. Values  obtained from different assay methods cannot be used interchangeably. CEA levels, regardless of value,  should not be interpreted as absolute evidence of the presence or absence of disease. .    No results found for: "PSA1" No results found for: "ZOX096" No results found for: "CAN125"  No results found for: "TOTALPROTELP", "ALBUMINELP", "A1GS", "A2GS", "BETS", "BETA2SER", "GAMS", "MSPIKE", "SPEI" Lab Results  Component Value Date   FERRITIN 119 10/28/2018   FERRITIN 63 02/17/2014   Lab Results  Component Value Date   LDH 170 10/28/2018   LDH 153 04/25/2018     STUDIES:   No results found.

## 2023-06-14 ENCOUNTER — Inpatient Hospital Stay: Payer: Medicare Other | Admitting: Hematology

## 2023-06-14 ENCOUNTER — Inpatient Hospital Stay: Payer: Medicare Other

## 2023-06-14 ENCOUNTER — Inpatient Hospital Stay: Payer: Medicare Other | Attending: Hematology

## 2023-06-14 VITALS — BP 124/57 | HR 68 | Temp 98.1°F | Resp 18

## 2023-06-14 VITALS — BP 158/70 | HR 66 | Temp 98.1°F | Resp 16 | Wt 170.6 lb

## 2023-06-14 DIAGNOSIS — Z95828 Presence of other vascular implants and grafts: Secondary | ICD-10-CM | POA: Diagnosis not present

## 2023-06-14 DIAGNOSIS — C778 Secondary and unspecified malignant neoplasm of lymph nodes of multiple regions: Secondary | ICD-10-CM | POA: Insufficient documentation

## 2023-06-14 DIAGNOSIS — C189 Malignant neoplasm of colon, unspecified: Secondary | ICD-10-CM

## 2023-06-14 DIAGNOSIS — Z5112 Encounter for antineoplastic immunotherapy: Secondary | ICD-10-CM | POA: Diagnosis not present

## 2023-06-14 DIAGNOSIS — C184 Malignant neoplasm of transverse colon: Secondary | ICD-10-CM | POA: Insufficient documentation

## 2023-06-14 DIAGNOSIS — Z79899 Other long term (current) drug therapy: Secondary | ICD-10-CM | POA: Insufficient documentation

## 2023-06-14 DIAGNOSIS — C787 Secondary malignant neoplasm of liver and intrahepatic bile duct: Secondary | ICD-10-CM | POA: Insufficient documentation

## 2023-06-14 DIAGNOSIS — C7800 Secondary malignant neoplasm of unspecified lung: Secondary | ICD-10-CM | POA: Insufficient documentation

## 2023-06-14 DIAGNOSIS — D8989 Other specified disorders involving the immune mechanism, not elsewhere classified: Secondary | ICD-10-CM

## 2023-06-14 LAB — TSH: TSH: 1.708 u[IU]/mL (ref 0.350–4.500)

## 2023-06-14 LAB — COMPREHENSIVE METABOLIC PANEL
ALT: 23 U/L (ref 0–44)
AST: 25 U/L (ref 15–41)
Albumin: 4.2 g/dL (ref 3.5–5.0)
Alkaline Phosphatase: 54 U/L (ref 38–126)
Anion gap: 8 (ref 5–15)
BUN: 22 mg/dL (ref 8–23)
CO2: 25 mmol/L (ref 22–32)
Calcium: 9.7 mg/dL (ref 8.9–10.3)
Chloride: 103 mmol/L (ref 98–111)
Creatinine, Ser: 0.65 mg/dL (ref 0.44–1.00)
GFR, Estimated: >60 mL/min (ref >60)
Glucose, Bld: 98 mg/dL (ref 70–99)
Potassium: 4.5 mmol/L (ref 3.5–5.1)
Sodium: 136 mmol/L (ref 135–145)
Total Bilirubin: 0.5 mg/dL (ref <1.2)
Total Protein: 7.8 g/dL (ref 6.5–8.1)

## 2023-06-14 LAB — CBC WITH DIFFERENTIAL/PLATELET
Abs Immature Granulocytes: 0.03 10*3/uL (ref 0.00–0.07)
Basophils Absolute: 0 10*3/uL (ref 0.0–0.1)
Basophils Relative: 0 %
Eosinophils Absolute: 0.2 10*3/uL (ref 0.0–0.5)
Eosinophils Relative: 3 %
HCT: 42.5 % (ref 36.0–46.0)
Hemoglobin: 13.2 g/dL (ref 12.0–15.0)
Immature Granulocytes: 0 %
Lymphocytes Relative: 22 %
Lymphs Abs: 1.5 10*3/uL (ref 0.7–4.0)
MCH: 28.8 pg (ref 26.0–34.0)
MCHC: 31.1 g/dL (ref 30.0–36.0)
MCV: 92.8 fL (ref 80.0–100.0)
Monocytes Absolute: 0.9 10*3/uL (ref 0.1–1.0)
Monocytes Relative: 13 %
Neutro Abs: 4.3 10*3/uL (ref 1.7–7.7)
Neutrophils Relative %: 62 %
Platelets: 174 10*3/uL (ref 150–400)
RBC: 4.58 MIL/uL (ref 3.87–5.11)
RDW: 13.5 % (ref 11.5–15.5)
WBC: 6.9 10*3/uL (ref 4.0–10.5)
nRBC: 0 % (ref 0.0–0.2)

## 2023-06-14 LAB — MAGNESIUM: Magnesium: 2.2 mg/dL (ref 1.7–2.4)

## 2023-06-14 MED ORDER — SODIUM CHLORIDE 0.9 % IV SOLN: Freq: Once | INTRAVENOUS | Status: AC

## 2023-06-14 MED ORDER — SODIUM CHLORIDE 0.9% FLUSH
10.0000 mL | INTRAVENOUS | Status: DC | PRN
  Administered 2023-06-14: 10 mL

## 2023-06-14 MED ORDER — HEPARIN SOD (PORK) LOCK FLUSH 100 UNIT/ML IV SOLN
500.0000 [IU] | Freq: Once | INTRAVENOUS | Status: AC | PRN
  Administered 2023-06-14: 500 [IU]

## 2023-06-14 MED ORDER — SODIUM CHLORIDE 0.9 % IV SOLN
400.0000 mg | Freq: Once | INTRAVENOUS | Status: AC
  Administered 2023-06-14: 400 mg via INTRAVENOUS
  Filled 2023-06-14: qty 16

## 2023-06-14 NOTE — Progress Notes (Signed)
Patient presents today for Keytruda infusion. Patient is in satisfactory condition with no new complaints voiced.  Vital signs are stable.  Labs reviewed by Dr. Ellin Saba during the office visit and all labs are within treatment parameters.  We will proceed with treatment per MD orders.

## 2023-06-14 NOTE — Progress Notes (Signed)
Patient tolerated therapy with no complaints voiced.  Side effects with management reviewed with understanding verbalized.  Port site clean and dry with no bruising or swelling noted at site.  Good blood return noted before and after administration of therapy.  Band aid applied.  Patient left in satisfactory condition with VSS and no s/s of distress noted.  

## 2023-06-14 NOTE — Progress Notes (Signed)
Patient has been examined by Dr. Katragadda. Vital signs and labs have been reviewed by MD - ANC, Creatinine, LFTs, hemoglobin, and platelets are within treatment parameters per M.D. - pt may proceed with treatment.  Primary RN and pharmacy notified.  

## 2023-06-14 NOTE — Patient Instructions (Signed)
[facility] [place] - A DEPT OF [facility]  Discharge Instructions: Thank you for choosing [facility] to provide your oncology and hematology care.  If you have a lab appointment with the Cancer Center - please note that after April 8th, 2024, all labs will be drawn in the cancer center.  You do not have to check in or register with the main entrance as you have in the past but will complete your check-in in the cancer center.  Wear comfortable clothing and clothing appropriate for easy access to any Portacath or PICC line.   We strive to give you quality time with your provider. You may need to reschedule your appointment if you arrive late (15 or more minutes).  Arriving late affects you and other patients whose appointments are after yours.  Also, if you miss three or more appointments without notifying the office, you may be dismissed from the clinic at the provider's discretion.      For prescription refill requests, have your pharmacy contact our office and allow 72 hours for refills to be completed.    Today you received the following chemotherapy and/or immunotherapy agents Rande Lawman   To help prevent nausea and vomiting after your treatment, we encourage you to take your nausea medication as directed.  BELOW ARE SYMPTOMS THAT SHOULD BE REPORTED IMMEDIATELY: *FEVER GREATER THAN 100.4 F (38 C) OR HIGHER *CHILLS OR SWEATING *NAUSEA AND VOMITING THAT IS NOT CONTROLLED WITH YOUR NAUSEA MEDICATION *UNUSUAL SHORTNESS OF BREATH *UNUSUAL BRUISING OR BLEEDING *URINARY PROBLEMS (pain or burning when urinating, or frequent urination) *BOWEL PROBLEMS (unusual diarrhea, constipation, pain near the anus) TENDERNESS IN MOUTH AND THROAT WITH OR WITHOUT PRESENCE OF ULCERS (sore throat, sores in mouth, or a toothache) UNUSUAL RASH, SWELLING OR PAIN  UNUSUAL VAGINAL DISCHARGE OR ITCHING   Items with * indicate a potential emergency and should be followed up as  soon as possible or go to the Emergency Department if any problems should occur.  Please show the CHEMOTHERAPY ALERT CARD or IMMUNOTHERAPY ALERT CARD at check-in to the Emergency Department and triage nurse.  Should you have questions after your visit or need to cancel or reschedule your appointment, please contact [facility] - A DEPT OF [facility] [phone]  and follow the prompts.  Office hours are 8:00 a.m. to 4:30 p.m. Monday - Friday. Please note that voicemails left after 4:00 p.m. may not be returned until the following business day.  We are closed weekends and major holidays. You have access to a nurse at all times for urgent questions. Please call the main number to the clinic [phone] and follow the prompts.  For any non-urgent questions, you may also contact your provider using MyChart. We now offer e-Visits for anyone 28 and older to request care online for non-urgent symptoms. For details visit [email or web address].   Also download the MyChart app! Go to the app store, search "MyChart", open the app, select [place], and log in with your MyChart username and password.

## 2023-06-14 NOTE — Patient Instructions (Signed)

## 2023-06-15 ENCOUNTER — Other Ambulatory Visit: Payer: Self-pay

## 2023-06-16 ENCOUNTER — Other Ambulatory Visit: Payer: Self-pay

## 2023-06-21 ENCOUNTER — Other Ambulatory Visit: Payer: Self-pay

## 2023-07-18 ENCOUNTER — Other Ambulatory Visit: Payer: Self-pay

## 2023-07-19 ENCOUNTER — Other Ambulatory Visit: Payer: Self-pay

## 2023-07-19 DIAGNOSIS — R5383 Other fatigue: Secondary | ICD-10-CM

## 2023-07-19 DIAGNOSIS — C189 Malignant neoplasm of colon, unspecified: Secondary | ICD-10-CM

## 2023-07-26 ENCOUNTER — Inpatient Hospital Stay: Payer: Medicare Other | Attending: Hematology

## 2023-07-26 ENCOUNTER — Inpatient Hospital Stay: Payer: Medicare Other | Admitting: Hematology

## 2023-07-26 ENCOUNTER — Other Ambulatory Visit: Payer: Medicare Other

## 2023-07-26 ENCOUNTER — Other Ambulatory Visit: Payer: Medicare Other | Attending: Hematology

## 2023-07-26 ENCOUNTER — Inpatient Hospital Stay: Payer: Medicare Other

## 2023-07-26 VITALS — BP 130/56 | HR 65 | Temp 97.5°F | Resp 20 | Wt 164.4 lb

## 2023-07-26 VITALS — BP 145/71 | HR 84 | Temp 98.0°F | Resp 18

## 2023-07-26 DIAGNOSIS — C787 Secondary malignant neoplasm of liver and intrahepatic bile duct: Secondary | ICD-10-CM | POA: Insufficient documentation

## 2023-07-26 DIAGNOSIS — C189 Malignant neoplasm of colon, unspecified: Secondary | ICD-10-CM

## 2023-07-26 DIAGNOSIS — C184 Malignant neoplasm of transverse colon: Secondary | ICD-10-CM | POA: Diagnosis not present

## 2023-07-26 DIAGNOSIS — R5383 Other fatigue: Secondary | ICD-10-CM

## 2023-07-26 DIAGNOSIS — Z5112 Encounter for antineoplastic immunotherapy: Secondary | ICD-10-CM | POA: Diagnosis not present

## 2023-07-26 DIAGNOSIS — Z95828 Presence of other vascular implants and grafts: Secondary | ICD-10-CM

## 2023-07-26 DIAGNOSIS — Z79899 Other long term (current) drug therapy: Secondary | ICD-10-CM | POA: Diagnosis not present

## 2023-07-26 DIAGNOSIS — T451X5A Adverse effect of antineoplastic and immunosuppressive drugs, initial encounter: Secondary | ICD-10-CM

## 2023-07-26 LAB — CBC WITH DIFFERENTIAL/PLATELET
Abs Immature Granulocytes: 0.02 10*3/uL (ref 0.00–0.07)
Basophils Absolute: 0 10*3/uL (ref 0.0–0.1)
Basophils Relative: 0 %
Eosinophils Absolute: 0.1 10*3/uL (ref 0.0–0.5)
Eosinophils Relative: 2 %
HCT: 39.3 % (ref 36.0–46.0)
Hemoglobin: 13 g/dL (ref 12.0–15.0)
Immature Granulocytes: 0 %
Lymphocytes Relative: 20 %
Lymphs Abs: 1.3 10*3/uL (ref 0.7–4.0)
MCH: 30.3 pg (ref 26.0–34.0)
MCHC: 33.1 g/dL (ref 30.0–36.0)
MCV: 91.6 fL (ref 80.0–100.0)
Monocytes Absolute: 0.8 10*3/uL (ref 0.1–1.0)
Monocytes Relative: 11 %
Neutro Abs: 4.6 10*3/uL (ref 1.7–7.7)
Neutrophils Relative %: 67 %
Platelets: 202 10*3/uL (ref 150–400)
RBC: 4.29 MIL/uL (ref 3.87–5.11)
RDW: 13.6 % (ref 11.5–15.5)
WBC: 6.8 10*3/uL (ref 4.0–10.5)
nRBC: 0 % (ref 0.0–0.2)

## 2023-07-26 LAB — TSH: TSH: 1.625 u[IU]/mL (ref 0.350–4.500)

## 2023-07-26 LAB — COMPREHENSIVE METABOLIC PANEL
ALT: 23 U/L (ref 0–44)
AST: 25 U/L (ref 15–41)
Albumin: 4.1 g/dL (ref 3.5–5.0)
Alkaline Phosphatase: 56 U/L (ref 38–126)
Anion gap: 9 (ref 5–15)
BUN: 17 mg/dL (ref 8–23)
CO2: 26 mmol/L (ref 22–32)
Calcium: 9.3 mg/dL (ref 8.9–10.3)
Chloride: 101 mmol/L (ref 98–111)
Creatinine, Ser: 0.68 mg/dL (ref 0.44–1.00)
GFR, Estimated: >60 mL/min (ref >60)
Glucose, Bld: 98 mg/dL (ref 70–99)
Potassium: 4 mmol/L (ref 3.5–5.1)
Sodium: 136 mmol/L (ref 135–145)
Total Bilirubin: 0.9 mg/dL (ref 0.0–1.2)
Total Protein: 7.7 g/dL (ref 6.5–8.1)

## 2023-07-26 LAB — MAGNESIUM: Magnesium: 2.4 mg/dL (ref 1.7–2.4)

## 2023-07-26 MED ORDER — SODIUM CHLORIDE 0.9 % IV SOLN
400.0000 mg | Freq: Once | INTRAVENOUS | Status: AC
  Administered 2023-07-26: 400 mg via INTRAVENOUS
  Filled 2023-07-26: qty 16

## 2023-07-26 MED ORDER — SODIUM CHLORIDE 0.9% FLUSH
10.0000 mL | INTRAVENOUS | Status: DC | PRN
  Administered 2023-07-26: 10 mL via INTRAVENOUS

## 2023-07-26 MED ORDER — HEPARIN SOD (PORK) LOCK FLUSH 100 UNIT/ML IV SOLN
500.0000 [IU] | Freq: Once | INTRAVENOUS | Status: AC | PRN
  Administered 2023-07-26: 500 [IU]

## 2023-07-26 MED ORDER — SODIUM CHLORIDE 0.9 % IV SOLN
Freq: Once | INTRAVENOUS | Status: AC
Start: 2023-07-26 — End: 2023-07-26

## 2023-07-26 NOTE — Progress Notes (Signed)
Patient tolerated chemotherapy with no complaints voiced.  Side effects with management reviewed with understanding verbalized.  Port site clean and dry with no bruising or swelling noted at site.  Good blood return noted before and after administration of chemotherapy.  Band aid applied.  Patient left in satisfactory condition with VSS and no s/s of distress noted. All follow ups as scheduled.   Amanda Norris [employer]

## 2023-07-26 NOTE — Progress Notes (Signed)
Patients port flushed without difficulty.  Good blood return noted with no bruising or swelling noted at site.  Patient remains accessed for treatment.  

## 2023-07-26 NOTE — Patient Instructions (Signed)
 [facility] [place] - A DEPT OF [facility]  Discharge Instructions: Thank you for choosing [facility] to provide your oncology and hematology care.  If you have a lab appointment with the Cancer Center - please note that after April 8th, 2024, all labs will be drawn in the cancer center.  You do not have to check in or register with the main entrance as you have in the past but will complete your check-in in the cancer center.  Wear comfortable clothing and clothing appropriate for easy access to any Portacath or PICC line.   We strive to give you quality time with your provider. You may need to reschedule your appointment if you arrive late (15 or more minutes).  Arriving late affects you and other patients whose appointments are after yours.  Also, if you miss three or more appointments without notifying the office, you may be dismissed from the clinic at the provider's discretion.      For prescription refill requests, have your pharmacy contact our office and allow 72 hours for refills to be completed.    Today you received the following chemotherapy and/or immunotherapy agents Rande Lawman   To help prevent nausea and vomiting after your treatment, we encourage you to take your nausea medication as directed.  BELOW ARE SYMPTOMS THAT SHOULD BE REPORTED IMMEDIATELY: *FEVER GREATER THAN 100.4 F (38 C) OR HIGHER *CHILLS OR SWEATING *NAUSEA AND VOMITING THAT IS NOT CONTROLLED WITH YOUR NAUSEA MEDICATION *UNUSUAL SHORTNESS OF BREATH *UNUSUAL BRUISING OR BLEEDING *URINARY PROBLEMS (pain or burning when urinating, or frequent urination) *BOWEL PROBLEMS (unusual diarrhea, constipation, pain near the anus) TENDERNESS IN MOUTH AND THROAT WITH OR WITHOUT PRESENCE OF ULCERS (sore throat, sores in mouth, or a toothache) UNUSUAL RASH, SWELLING OR PAIN  UNUSUAL VAGINAL DISCHARGE OR ITCHING   Items with * indicate a potential emergency and should be followed up as  soon as possible or go to the Emergency Department if any problems should occur.  Please show the CHEMOTHERAPY ALERT CARD or IMMUNOTHERAPY ALERT CARD at check-in to the Emergency Department and triage nurse.  Should you have questions after your visit or need to cancel or reschedule your appointment, please contact [facility] - A DEPT OF Eligha Brideg[facility] [phone]  and follow the prompts.  Office hours are 8:00 a.m. to 4:30 p.m. Monday - Friday. Please note that voicemails left after 4:00 p.m. may not be returned until the following business day.  We are closed weekends and major holidays. You have access to a nurse at all times for urgent questions. Please call the main number to the clinic [phone] and follow the prompts.  For any non-urgent questions, you may also contact your provider using MyChart. We now offer e-Visits for anyone 28 and older to request care online for non-urgent symptoms. For details visit [email or web address].   Also download the MyChart app! Go to the app store, search "MyChart", open the app, select [place], and log in with your MyChart username and password.

## 2023-07-27 LAB — CEA: CEA: 9 ng/mL — ABNORMAL HIGH (ref 0.0–4.7)

## 2023-08-23 ENCOUNTER — Encounter: Payer: Self-pay | Admitting: Hematology

## 2023-08-30 ENCOUNTER — Ambulatory Visit (HOSPITAL_COMMUNITY)
Admission: RE | Admit: 2023-08-30 | Discharge: 2023-08-30 | Disposition: A | Discharge status: Home / Self Care | Payer: Medicare Other | Source: Ambulatory Visit | Attending: Hematology | Admitting: Hematology

## 2023-08-30 DIAGNOSIS — C787 Secondary malignant neoplasm of liver and intrahepatic bile duct: Secondary | ICD-10-CM | POA: Insufficient documentation

## 2023-08-30 DIAGNOSIS — K769 Liver disease, unspecified: Secondary | ICD-10-CM | POA: Diagnosis not present

## 2023-08-30 DIAGNOSIS — R1900 Intra-abdominal and pelvic swelling, mass and lump, unspecified site: Secondary | ICD-10-CM | POA: Diagnosis not present

## 2023-08-30 DIAGNOSIS — R935 Abnormal findings on diagnostic imaging of other abdominal regions, including retroperitoneum: Secondary | ICD-10-CM | POA: Diagnosis not present

## 2023-08-30 DIAGNOSIS — C189 Malignant neoplasm of colon, unspecified: Secondary | ICD-10-CM | POA: Insufficient documentation

## 2023-08-30 MED ORDER — IOHEXOL 300 MG/ML  SOLN
100.0000 mL | Freq: Once | INTRAMUSCULAR | Status: AC | PRN
  Administered 2023-08-30: 100 mL via INTRAVENOUS

## 2023-08-30 MED ORDER — IOHEXOL 9 MG/ML PO SOLN: 500.0000 mL | ORAL | Status: AC

## 2023-09-04 ENCOUNTER — Other Ambulatory Visit: Payer: Self-pay

## 2023-09-06 ENCOUNTER — Inpatient Hospital Stay: Payer: Medicare Other | Attending: Hematology | Admitting: Hematology

## 2023-09-06 ENCOUNTER — Inpatient Hospital Stay: Payer: Medicare Other

## 2023-09-06 ENCOUNTER — Other Ambulatory Visit: Payer: Medicare Other

## 2023-09-06 VITALS — BP 132/56 | HR 58 | Temp 98.0°F | Resp 18

## 2023-09-06 VITALS — BP 143/68 | HR 83 | Temp 98.4°F | Resp 17 | Wt 165.1 lb

## 2023-09-06 DIAGNOSIS — C184 Malignant neoplasm of transverse colon: Secondary | ICD-10-CM | POA: Diagnosis not present

## 2023-09-06 DIAGNOSIS — Z95828 Presence of other vascular implants and grafts: Secondary | ICD-10-CM

## 2023-09-06 DIAGNOSIS — C7802 Secondary malignant neoplasm of left lung: Secondary | ICD-10-CM | POA: Diagnosis not present

## 2023-09-06 DIAGNOSIS — C189 Malignant neoplasm of colon, unspecified: Secondary | ICD-10-CM

## 2023-09-06 DIAGNOSIS — Z5112 Encounter for antineoplastic immunotherapy: Secondary | ICD-10-CM | POA: Diagnosis not present

## 2023-09-06 DIAGNOSIS — C779 Secondary and unspecified malignant neoplasm of lymph node, unspecified: Secondary | ICD-10-CM | POA: Insufficient documentation

## 2023-09-06 DIAGNOSIS — C787 Secondary malignant neoplasm of liver and intrahepatic bile duct: Secondary | ICD-10-CM | POA: Insufficient documentation

## 2023-09-06 DIAGNOSIS — Z79620 Long term (current) use of immunosuppressive biologic: Secondary | ICD-10-CM | POA: Insufficient documentation

## 2023-09-06 DIAGNOSIS — C7801 Secondary malignant neoplasm of right lung: Secondary | ICD-10-CM | POA: Insufficient documentation

## 2023-09-06 LAB — COMPREHENSIVE METABOLIC PANEL
ALT: 21 U/L (ref 0–44)
AST: 25 U/L (ref 15–41)
Albumin: 3.9 g/dL (ref 3.5–5.0)
Alkaline Phosphatase: 52 U/L (ref 38–126)
Anion gap: 8 (ref 5–15)
BUN: 17 mg/dL (ref 8–23)
CO2: 26 mmol/L (ref 22–32)
Calcium: 9.3 mg/dL (ref 8.9–10.3)
Chloride: 101 mmol/L (ref 98–111)
Creatinine, Ser: 0.63 mg/dL (ref 0.44–1.00)
GFR, Estimated: >60 mL/min (ref >60)
Glucose, Bld: 89 mg/dL (ref 70–99)
Potassium: 3.9 mmol/L (ref 3.5–5.1)
Sodium: 135 mmol/L (ref 135–145)
Total Bilirubin: 0.5 mg/dL (ref 0.0–1.2)
Total Protein: 7.6 g/dL (ref 6.5–8.1)

## 2023-09-06 LAB — CBC WITH DIFFERENTIAL/PLATELET
Abs Immature Granulocytes: 0.01 10*3/uL (ref 0.00–0.07)
Basophils Absolute: 0 10*3/uL (ref 0.0–0.1)
Basophils Relative: 1 %
Eosinophils Absolute: 0.1 10*3/uL (ref 0.0–0.5)
Eosinophils Relative: 2 %
HCT: 39.1 % (ref 36.0–46.0)
Hemoglobin: 12.9 g/dL (ref 12.0–15.0)
Immature Granulocytes: 0 %
Lymphocytes Relative: 21 %
Lymphs Abs: 1.4 10*3/uL (ref 0.7–4.0)
MCH: 30.4 pg (ref 26.0–34.0)
MCHC: 33 g/dL (ref 30.0–36.0)
MCV: 92.2 fL (ref 80.0–100.0)
Monocytes Absolute: 0.8 10*3/uL (ref 0.1–1.0)
Monocytes Relative: 13 %
Neutro Abs: 4.2 10*3/uL (ref 1.7–7.7)
Neutrophils Relative %: 63 %
Platelets: 187 10*3/uL (ref 150–400)
RBC: 4.24 MIL/uL (ref 3.87–5.11)
RDW: 13 % (ref 11.5–15.5)
WBC: 6.5 10*3/uL (ref 4.0–10.5)
nRBC: 0 % (ref 0.0–0.2)

## 2023-09-06 LAB — MAGNESIUM: Magnesium: 2.2 mg/dL (ref 1.7–2.4)

## 2023-09-06 MED ORDER — SODIUM CHLORIDE 0.9% FLUSH
10.0000 mL | INTRAVENOUS | Status: DC | PRN
Start: 2023-09-06 — End: 2023-09-06
  Administered 2023-09-06: 10 mL via INTRAVENOUS

## 2023-09-06 MED ORDER — SODIUM CHLORIDE 0.9 % IV SOLN
400.0000 mg | Freq: Once | INTRAVENOUS | Status: AC
  Administered 2023-09-06: 400 mg via INTRAVENOUS
  Filled 2023-09-06: qty 16

## 2023-09-06 MED ORDER — SODIUM CHLORIDE 0.9% FLUSH
10.0000 mL | INTRAVENOUS | Status: DC | PRN
  Administered 2023-09-06: 10 mL

## 2023-09-06 MED ORDER — HEPARIN SOD (PORK) LOCK FLUSH 100 UNIT/ML IV SOLN
500.0000 [IU] | Freq: Once | INTRAVENOUS | Status: AC | PRN
  Administered 2023-09-06: 500 [IU]

## 2023-09-06 MED ORDER — SODIUM CHLORIDE 0.9 % IV SOLN: Freq: Once | INTRAVENOUS | Status: AC

## 2023-09-06 NOTE — Progress Notes (Signed)
 Patients port flushed without difficulty.  Good blood return noted with no bruising or swelling noted at site.  Patient remains accessed for treatment.

## 2023-09-06 NOTE — Progress Notes (Signed)
 Patient has been examined by Dr. Ellin Saba. Vital signs and labs have been reviewed by MD - ANC, Creatinine, LFTs, hemoglobin, and platelets are within treatment parameters per M.D. - pt may proceed with treatment.  Primary RN and pharmacy notified.

## 2023-09-06 NOTE — Patient Instructions (Signed)
 [facility] at [facility] Discharge Instructions   You were seen and examined today by Dr. Ellin Saba.  He reviewed the results of your lab work which are normal/stable.   He reviewed the results of your CT scan which shows improvement of the cancer.   We will proceed with your treatment today.   Return as scheduled.    Thank you for choosing [facility] at [facility] to provide your oncology and hematology care.  To afford each patient quality time with our provider, please arrive at least 15 minutes before your scheduled appointment time.   If you have a lab appointment with the Cancer Center please come in thru the Main Entrance and check in at the main information desk.  You need to re-schedule your appointment should you arrive 10 or more minutes late.  We strive to give you quality time with our providers, and arriving late affects you and other patients whose appointments are after yours.  Also, if you no show three or more times for appointments you may be dismissed from the clinic at the providers discretion.     Again, thank you for choosing [facility].  Our hope is that these requests will decrease the amount of time that you wait before being seen by our physicians.       _____________________________________________________________  Should you have questions after your visit to [facility], please contact our office at [phone] and follow the prompts.  Our office hours are 8:00 a.m. and 4:30 p.m. Monday - Friday.  Please note that voicemails left after 4:00 p.m. may not be returned until the following business day.  We are closed weekends and major holidays.  You do have access to a nurse 24-7, just call the main number to the clinic [phone] and do not press any options, hold on the line and a nurse will answer the phone.    For prescription refill requests, have your pharmacy contact our office  and allow 72 hours.    Due to Covid, you will need to wear a mask upon entering the hospital. If you do not have a mask, a mask will be given to you at the Main Entrance upon arrival. For doctor visits, patients may have 1 support person age 19 or older with them. For treatment visits, patients can not have anyone with them due to social distancing guidelines and our immunocompromised population.

## 2023-09-06 NOTE — Progress Notes (Signed)
 [facility] [address] [place] [address]    Clinic Day:  09/06/23   Referring physician: Elfredia Nevins, MD  Patient Care Team: Elfredia Nevins, MD as PCP - General (Internal Medicine) Franky Macho, MD as Consulting Physician (General Surgery) Doreatha Massed, MD as Medical Oncologist (Medical Oncology)   ASSESSMENT & PLAN:   Assessment: 1.  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 - Status post 3 cycles of FOLFOX from 02/10/2014 through 03/10/2014, discontinued secondary to poor tolerance.  DPD was negative. -Last colonoscopy on 03/28/2018 patent ileo-colonic anastomosis.  8 mm polyp in the transverse colon.  Diverticulosis in the sigmoid colon, descending colon. - CT scan on 11/04/2018 shows postoperative findings of right colectomy with redemonstrated mesenteric nodule/lymph node measuring 1.9 x 1.7 cm, unchanged from prior scan in October 2019.  No evidence of metastatic disease.  Stable solid and groundglass pulmonary nodules, stable over multiple prior exams. -CEA was 4.6 on 10/28/2018. -CTAP on 09/21/2020 showed numerous small pulmonary nodules throughout the lung bases, 5 mm.  Enlarged retrocrural periaortic lymph node in the lower chest.  Soft tissue nodule within the central mesentery substantially increased in size, encasing proximal superior mesenteric artery and measuring 5 x 4.3 cm.  Numerous retroperitoneal lymph nodes, largest aortocaval lymph node measuring 2.8 x 2.2 cm. -CT chest on 09/27/2020 shows a left thoracic inlet lymph node approximately 2.3 cm.  Mediastinal lymph nodes and multiple subcentimeter pulmonary nodules suggestive of metastatic disease. -Mesenteric lymph node biopsy on 09/28/2020 shows mucin and fibrosis. -Left supraclavicular lymph node biopsy on 10/14/2020-soft tissue with abundant  mucin, rare fragments of atypical columnar epithelium. - 3 cycles of FOLFIRI and bevacizumab dose reduced from 11/10/2020 through 12/08/2020. - Caris test-BRAF V600 E+, MMR deficient, MSI high, TMB high, HER2 negative, BRCA1/2 pathogenic variant on exon 14 and exon 9 respectively.  The report also suggested decreased benefit to FOLFOX and bevacizumab in the first-line metastatic setting. - Cycle 1 of Keytruda on 01/13/2021, switch to every 6 weeks regimen on 03/22/2023    Plan: 1.  Metastatic colon cancer to the liver, lungs and lymph nodes, BRAF V600E+: - She is tolerating immunotherapy reasonably well.  No skin rashes, dry cough or shortness of breath. - CT CAP (08/30/2023): Decrease in size of the lymph nodes above and below the diaphragm as well as partially calcified hypodense mass in the central mesentery.  Multiple bilateral small solid groundglass nodules decreased in size.  Decrease in size of the hypodense segment 4 hepatic lesion.  No new progressive disease. - Reviewed labs from 09/06/2023: Normal LFTs and CBC.  CEA on 07/26/2023 was 9.0.  TSH is 1.6. - She is getting a great response.  Continue Keytruda every 6 weeks.  RTC 12 weeks for follow-up with repeat CT CAP and CEA levels.   2.  Diarrhea: - Baseline diarrhea is stable.  Continue Imodium 2 to 3 tablets daily as needed.    Orders Placed This Encounter  Procedures   CEA    Standing Status:   Future    Expected Date:   01/10/2024    Expiration Date:   01/09/2025   Magnesium    Standing Status:   Future    Expected Date:   01/10/2024    Expiration Date:   01/09/2025   CBC with Differential    Standing Status:   Future  Expected Date:   01/10/2024    Expiration Date:   01/09/2025   Comprehensive metabolic panel    Standing Status:   Future    Expected Date:   01/10/2024    Expiration Date:   01/09/2025   CEA    Standing Status:   Future    Expected Date:   02/21/2024    Expiration Date:   02/20/2025   Magnesium    Standing Status:    Future    Expected Date:   02/21/2024    Expiration Date:   02/20/2025   CBC with Differential    Standing Status:   Future    Expected Date:   02/21/2024    Expiration Date:   02/20/2025   Comprehensive metabolic panel    Standing Status:   Future    Expected Date:   02/21/2024    Expiration Date:   02/20/2025      Mikeal Hawthorne R Teague,acting as a scribe for Doreatha Massed, MD.,have documented all relevant documentation on the behalf of Doreatha Massed, MD,as directed by  Doreatha Massed, MD while in the presence of Doreatha Massed, MD.  I, Doreatha Massed MD, have reviewed the above documentation for accuracy and completeness, and I agree with the above.      Doreatha Massed, MD   2/27/20251:24 PM  CHIEF COMPLAINT:   Diagnosis: metastatic transverse colon adenocarcinoma    Cancer Staging  No matching staging information was found for the patient.    Prior Therapy: 1. Right hemicolectomy on 12/29/2013. 2. FOLFOX x 3 cycles from 02/10/2014 to 03/10/2014, stopped secondary to poor tolerance  Current Therapy:  Keytruda every 6 weeks    HISTORY OF PRESENT ILLNESS:   Oncology History  Adenocarcinoma of transverse colon (HCC) (Resolved)  12/09/2013 Initial Diagnosis   1. Colon, biopsy, proximal transverse mass INVASIVE ADENOCARCINOMA.   12/29/2013 Definitive Surgery   Colon, segmental resection for tumor, right - INVASIVE COLORECTAL ADENOCARCINOMA, 7 CM, EXTENDING INTO PERICOLONIC CONNECTIVE TISSUE. - METASTATIC CARCINOMA IN 6 OF 33 LYMPH NODES (6/33).   02/10/2014 - 03/10/2014 Adjuvant Chemotherapy   FOLFOX x 3 cycles   03/11/2014 Adverse Reaction   Patient called reporting diarrhea despite dose reduction and she wants to stop therapy.  Patient seen on 03/24/14 to discuss other treatment options and she stands by her decision to stop all therapy.   04/01/2014 Surgery   Port-a-cath removal by Dr. Lovell Sheehan.   01/12/2015 PET scan   Mild abdominal mesenteric LAD  show hypermetabolic activity, 7 mm indeterminate pulm nodule in sup segment of RLL shows no assoc metabolic activity   03/24/2015 PET scan   Mild progression of nodal mets in anterior mid abdominal mesentery, new nodal mets at L thoracic inlet. stable 6 mm nodule in posterior RLL, unchanged since 2015   06/22/2015 PET scan   Resolution of metabolic activity and decreased size of mild LAD at L thoracic inlet, stable mild mid abd hypermetabolic mesenteric LAD, stable 6 mm posterior LLL pulm nodule without metabolic activity   12/22/2015 Imaging   MRI lumbar spine L4-L5 disc degenerated, broad based disc hernation, B facet arthropathy with hypertophy and edema, stenosis of lateral recesses that could cause neural compression   10/18/2016 Imaging   CT CAP- 1. Several small ground-glass pulmonary nodules are present in the lungs and are stable over the past 9 months, but several are mildly larger than they were in 2015. Low-grade adenocarcinoma can sometimes have this appearance and surveillance is likely warranted. 2. There is  a cluster of nodal tissue in the central mesentery, maximum short axis diameter 1.3 cm. This nodal cluster is relatively low in density and not appreciably changed from the prior exam. Given the lack of change is may represent quiescent residua of malignancy, but again, surveillance is likely warranted. 3. The left-sided rib fractures are more numerous than was revealed on the prior radiographs ; there are nondisplaced healing fractures of the left fifth, sixth, seventh, eighth, ninth, and tenth ribs. 4. Mild cardiomegaly although part of this appearance may be due to pectus excavatum. 5. Several additional small pulmonary nodules are stable from 2015 and considered benign. 6. Right hemicolectomy and sigmoid colon diverticulosis. 7. Small right paraumbilical hernia containing adipose tissue. 8. Lower lumbar spondylosis and degenerative disc disease potentially with mild  impingement at L4-5.   04/25/2017 Imaging   CT C/A/P: Scattered solid pulmonary nodules measure up to 6 mm in the right lower lobe, unchanged. There are a few scattered ground-glass nodules measuring up to 8 mm in the right upper lobe, also stable. Distal perigastric lymph nodes are stable. No additional evidence of metastatic disease.   Metastatic colon cancer to liver (HCC)  11/04/2020 Initial Diagnosis   Metastatic colon cancer to liver (HCC)   11/10/2020 - 12/10/2020 Chemotherapy         01/13/2021 - 03/02/2022 Chemotherapy   Patient is on Treatment Plan : COLORECTAL Pembrolizumab q21d     01/13/2021 -  Chemotherapy   Patient is on Treatment Plan : COLORECTAL Pembrolizumab (400) q42 days        INTERVAL HISTORY:   Amanda Norris is a 78 y.o. female presenting to clinic today for follow up of metastatic transverse colon adenocarcinoma. She was last seen by me on 06/14/23.  Since her last visit, she underwent CT C/A/P on 08/30/23 that found: Decreased size of lymph nodes above and below the diaphragm as well as the partially calcified hypodense mass in the central mesentery. Multiple small bilateral solid and ground-glass pulmonary nodules are overall slightly decreased prior examination. No new suspicious pulmonary nodules or masses. Decreased size of the hypodense segment IV hepatic lesion. No evidence of new or progressive disease in the chest, abdomen or pelvis. Prior right hemicolectomy with ileocolic anastomosis. No new suspicious nodularity along the suture line.  Today, she states that she is doing well overall. Her appetite level is at 100%. Her energy level is at 80%. Ota is accompanied by her husband. She denies any SOB. Diarrhea is stable and she is taking Imodium TID. She has occasional flare ups of mild skin rashes. Ledora notes a cough after waking in the morning. She denies any infections in the last 3 months.   PAST MEDICAL HISTORY:   Past Medical History: Past Medical History:   Diagnosis Date   Arthritis    wrist and thumbs   Colon cancer (HCC)    PONV (postoperative nausea and vomiting)    Port-A-Cath in place 11/09/2020   Ruptured lumbar disc    L1-L5 per patient report   Sciatica of left side     Surgical History: Past Surgical History:  Procedure Laterality Date   COLONOSCOPY N/A 12/12/2013   Procedure: COLONOSCOPY;  Surgeon: Malissa Hippo, MD;  Location: [facility];  Service: Endoscopy;  Laterality: N/A;  730   COLONOSCOPY N/A 01/20/2015   Procedure: COLONOSCOPY;  Surgeon: Malissa Hippo, MD;  Location: [facility];  Service: Endoscopy;  Laterality: N/A;  1030   COLONOSCOPY N/A 03/28/2018   Procedure:  COLONOSCOPY;  Surgeon: Malissa Hippo, MD;  Location: [facility];  Service: Endoscopy;  Laterality: N/A;  1015   EUS N/A 12/18/2013   Procedure: UPPER ENDOSCOPIC ULTRASOUND (EUS) LINEAR;  Surgeon: Rachael Fee, MD;  Location: [facility];  Service: Endoscopy;  Laterality: N/A;   herniated disc     [id number]   IR IMAGING GUIDED PORT INSERTION  10/14/2020   IR US GUIDANCE  10/14/2020   PARTIAL COLECTOMY N/A 12/29/2013   Procedure: RIGHT HEMICOLECTOMY;  Surgeon: Dalia Heading, MD;  Location: [facility];  Service: General;  Laterality: N/A;   POLYPECTOMY  03/28/2018   Procedure: POLYPECTOMY;  Surgeon: Malissa Hippo, MD;  Location: [facility];  Service: Endoscopy;;  transverse colon (hot snare);   PORT-A-CATH REMOVAL Right 04/01/2014   Procedure: MINOR REMOVAL PORT-A-CATH;  Surgeon: Dalia Heading, MD;  Location: [facility];  Service: General;  Laterality: Right;   PORTACATH PLACEMENT Right 02/04/2014   Procedure: INSERTION PORT-A-CATH;  Surgeon: Dalia Heading, MD;  Location: [facility];  Service: General;  Laterality: Right;    Social History: Social History   Socioeconomic History   Marital status: Married    Spouse name: Not on file   Number of children: Not on file   Years of education: Not on file   Highest education level: Not on file   Occupational History   Not on file  Tobacco Use   Smoking status: Former    Current packs/day: 0.00    Average packs/day: 0.3 packs/day for 5.0 years (1.3 ttl pk-yrs)    Types: Cigarettes    Start date: 04/18/1954    Quit date: 04/19/1959    Years since quitting: 64.4   Smokeless tobacco: Never   Tobacco comments:    smoked for 5 years as teenager  Vaping Use   Vaping status: Never Used  Substance and Sexual Activity   Alcohol use: No   Drug use: No   Sexual activity: Yes    Birth control/protection: Post-menopausal  Other Topics Concern   Not on file  Social History Narrative   Not on file   Social Drivers of Health   Financial Resource Strain: Low Risk  (10/04/2020)   Overall Financial Resource Strain (CARDIA)    Difficulty of Paying Living Expenses: Not hard at all  Food Insecurity: No Food Insecurity (10/04/2020)   Hunger Vital Sign    Worried About Running Out of Food in the Last Year: Never true    Ran Out of Food in the Last Year: Never true  Transportation Needs: No Transportation Needs (10/04/2020)   PRAPARE - Administrator, Civil Service (Medical): No    Lack of Transportation (Non-Medical): No  Physical Activity: Inactive (10/04/2020)   Exercise Vital Sign    Days of Exercise per Week: 0 days    Minutes of Exercise per Session: 0 min  Stress: Stress Concern Present (10/04/2020)   [employer] of Occupational Health - Occupational Stress Questionnaire    Feeling of Stress : To some extent  Social Connections: Socially Integrated (10/04/2020)   Social Connection and Isolation Panel [NHANES]    Frequency of Communication with Friends and Family: More than three times a week    Frequency of Social Gatherings with Friends and Family: More than three times a week    Attends Religious Services: More than 4 times per year    Active Member of [employer] or Organizations: Yes    Attends Banker Meetings: More  than 4 times per year    Marital  Status: Married  Catering manager Violence: Not At Risk (10/04/2020)   Humiliation, Afraid, Rape, and Kick questionnaire    Fear of Current or Ex-Partner: No    Emotionally Abused: No    Physically Abused: No    Sexually Abused: No    Family History: Family History  Problem Relation Age of Onset   Diabetes type II Mother    Dementia Father     Current Medications:  Current Outpatient Medications:    acetaminophen (TYLENOL) 500 MG tablet, Take 500 mg by mouth every 6 (six) hours as needed for moderate pain or headache., Disp: , Rfl:    aspirin 81 MG EC tablet, Take 1 tablet (81 mg total) by mouth daily. Swallow whole., Disp: 30 tablet, Rfl: 12   Bioflavonoid Products (ESTER C PO), Take 1,000 mg by mouth daily., Disp: , Rfl:    Cholecalciferol (D3-1000) 25 MCG (1000 UT) capsule, Take 125 Units by mouth daily., Disp: , Rfl:    Cranberry-Vitamin C-Vitamin E 4200-20-3 MG-MG-UNIT CAPS, Take 500 mg by mouth daily., Disp: , Rfl:    Garlic 1000 MG CAPS, Take 1,000 mg by mouth daily. , Disp: , Rfl:    lidocaine-prilocaine (EMLA) cream, Apply small amount to port a cath site and cover with plastic wrap 1 hour prior to infusion appointments, Disp: 30 g, Rfl: 3   methocarbamol (ROBAXIN) 500 MG tablet, Take 500-1,000 mg by mouth every 8 (eight) hours as needed., Disp: , Rfl:    Multiple Vitamin (MULTIVITAMIN) tablet, Take 1 tablet by mouth daily., Disp: , Rfl:    naproxen (NAPROSYN) 500 MG tablet, Take 500 mg by mouth 2 (two) times daily., Disp: , Rfl:    OVER THE COUNTER MEDICATION, Take 1,000 mg by mouth daily. [place]a 1000 mg daily, Disp: , Rfl:    zinc gluconate 50 MG tablet, Take 50 mg by mouth daily., Disp: , Rfl:  No current facility-administered medications for this visit.  Facility-Administered Medications Ordered in Other Visits:    0.9 %  sodium chloride infusion, , Intravenous, Once, Doreatha Massed, MD   heparin lock flush 100 unit/mL, 500 Units, Intracatheter, Once PRN,  Doreatha Massed, MD   pembrolizumab [facility]) 400 mg in sodium chloride 0.9 % 50 mL chemo infusion, 400 mg, Intravenous, Once, Doreatha Massed, MD   sodium chloride flush (NS) 0.9 % injection 10 mL, 10 mL, Intracatheter, PRN, Doreatha Massed, MD   Allergies: Allergies  Allergen Reactions   Sulfa Antibiotics Other (See Comments)    "Sores in my mouth"    REVIEW OF SYSTEMS:   Review of Systems  Constitutional:  Negative for chills, fatigue and fever.  HENT:   Negative for lump/mass, mouth sores, nosebleeds, sore throat and trouble swallowing.   Eyes:  Negative for eye problems.  Respiratory:  Positive for cough. Negative for shortness of breath.   Cardiovascular:  Negative for chest pain, leg swelling and palpitations.  Gastrointestinal:  Positive for diarrhea. Negative for abdominal pain, constipation, nausea and vomiting.  Genitourinary:  Negative for bladder incontinence, difficulty urinating, dysuria, frequency, hematuria and nocturia.   Musculoskeletal:  Negative for arthralgias, back pain, flank pain, myalgias and neck pain.  Skin:  Negative for itching and rash.  Neurological:  Negative for dizziness, headaches and numbness.  Hematological:  Does not bruise/bleed easily.  Psychiatric/Behavioral:  Negative for depression, sleep disturbance and suicidal ideas. The patient is not nervous/anxious.   All other systems reviewed and are negative.  VITALS:   There were no vitals taken for this visit.  Wt Readings from Last 3 Encounters:  09/06/23 165 lb 2 oz (74.9 kg)  07/26/23 164 lb 6.4 oz (74.6 kg)  06/14/23 170 lb 9.6 oz (77.4 kg)    There is no height or weight on file to calculate BMI.  Performance status (ECOG): 1 - Symptomatic but completely ambulatory  PHYSICAL EXAM:   Physical Exam Vitals and nursing note reviewed. Exam conducted with a chaperone present.  Constitutional:      Appearance: Normal appearance.  Cardiovascular:     Rate and Rhythm:  Normal rate and regular rhythm.     Pulses: Normal pulses.     Heart sounds: Normal heart sounds.  Pulmonary:     Effort: Pulmonary effort is normal.     Breath sounds: Normal breath sounds.  Abdominal:     Palpations: Abdomen is soft. There is no hepatomegaly, splenomegaly or mass.     Tenderness: There is no abdominal tenderness.  Musculoskeletal:     Right lower leg: No edema.     Left lower leg: No edema.  Lymphadenopathy:     Cervical: No cervical adenopathy.     Right cervical: No superficial, deep or posterior cervical adenopathy.    Left cervical: No superficial, deep or posterior cervical adenopathy.     Upper Body:     Right upper body: No supraclavicular or axillary adenopathy.     Left upper body: No supraclavicular or axillary adenopathy.  Neurological:     General: No focal deficit present.     Mental Status: She is alert and oriented to person, place, and time.  Psychiatric:        Mood and Affect: Mood normal.        Behavior: Behavior normal.     LABS:      Latest Ref Rng & Units 09/06/2023   12:12 PM 07/26/2023   10:49 AM 06/14/2023   12:02 PM  CBC  WBC 4.0 - 10.5 K/uL 6.5  6.8  6.9   Hemoglobin 12.0 - 15.0 g/dL 30.8  65.7  84.6   Hematocrit 36.0 - 46.0 % 39.1  39.3  42.5   Platelets 150 - 400 K/uL 187  202  174       Latest Ref Rng & Units 09/06/2023   12:12 PM 07/26/2023   10:49 AM 06/14/2023   12:02 PM  CMP  Glucose 70 - 99 mg/dL 89  98  98   BUN 8 - 23 mg/dL 17  17  22    Creatinine 0.44 - 1.00 mg/dL 9.62  9.52  8.41   Sodium 135 - 145 mmol/L 135  136  136   Potassium 3.5 - 5.1 mmol/L 3.9  4.0  4.5   Chloride 98 - 111 mmol/L 101  101  103   CO2 22 - 32 mmol/L 26  26  25    Calcium 8.9 - 10.3 mg/dL 9.3  9.3  9.7   Total Protein 6.5 - 8.1 g/dL 7.6  7.7  7.8   Total Bilirubin 0.0 - 1.2 mg/dL 0.5  0.9  0.5   Alkaline Phos 38 - 126 U/L 52  56  54   AST 15 - 41 U/L 25  25  25    ALT 0 - 44 U/L 21  23  23       Lab Results  Component Value Date    CEA1 9.0 (H) 07/26/2023   CEA 7.7 (H) 08/26/2020   /  CEA  Date Value Ref Range Status  07/26/2023 9.0 (H) 0.0 - 4.7 ng/mL Final    Comment:    (NOTE)                             Nonsmokers          <3.9                             Smokers             <5.6 [employer] Electrochemiluminescence Immunoassay (ECLIA) Values obtained with different assay methods or kits cannot be used interchangeably.  Results cannot be interpreted as absolute evidence of the presence or absence of malignant disease. Performed At: [facility] [address] [place], [place] [id number] Jolene Schimke MD UV:[id number]   08/26/2020 7.7 (H) ng/mL Final    Comment:    Non-Smoker: <2.5 Smoker:     <5.0 . . This test was performed using the Siemens  chemiluminescent method. Values obtained from different assay methods cannot be used interchangeably. CEA levels, regardless of value, should not be interpreted as absolute evidence of the presence or absence of disease. .    No results found for: "PSA1" No results found for: "VQQ595" No results found for: "CAN125"  No results found for: "TOTALPROTELP", "ALBUMINELP", "A1GS", "A2GS", "BETS", "BETA2SER", "GAMS", "MSPIKE", "SPEI" Lab Results  Component Value Date   FERRITIN 119 10/28/2018   FERRITIN 63 02/17/2014   Lab Results  Component Value Date   LDH 170 10/28/2018   LDH 153 04/25/2018     STUDIES:   CT CHEST ABDOMEN PELVIS W CONTRAST Result Date: 08/30/2023 CLINICAL DATA:  Transverse colonic adenocarcinoma status post right hemicolectomy, chemotherapy ongoing immunotherapy. Follow-up. * Tracking Code: BO * EXAM: CT CHEST, ABDOMEN, AND PELVIS WITH CONTRAST TECHNIQUE: Multidetector CT imaging of the chest, abdomen and pelvis was performed following the standard protocol during bolus administration of intravenous contrast. RADIATION DOSE REDUCTION: This exam was performed according to the departmental dose-optimization program which  includes automated exposure control, adjustment of the mA and/or kV according to patient size and/or use of iterative reconstruction technique. CONTRAST:  OMNIPAQUE IOHEXOL 300 MG/ML  SOLN COMPARISON:  Including CT March 15, 2023. FINDINGS: CT CHEST FINDINGS Cardiovascular: Right chest Port-A-Cath with tip at the superior cavoatrial junction. Normal caliber thoracic aorta. Normal size heart. Mediastinum/Nodes: Slight decrease in size of the left supraclavicular, pretracheal and low right paraesophageal lymph nodes. For reference: -left supraclavicular lymph node conglomerate measures 2.5 x 1.4 cm on image 11/2 previously 3.3 x 1.8 cm. -Calcified hypodense right paratracheal lymph node measuring 16 x 14 mm on image 26/2 previously 18 x 16 mm. Lungs/Pleura: Multiple small bilateral solid and ground-glass pulmonary nodules are are overall slightly decreased prior examination. No new suspicious pulmonary nodules or masses. For reference: -right lower lobe pulmonary nodule measures 2 mm on image 118/6 previously 4 mm -right lower lobe pulmonary nodule measures 7 mm on image 92/6 previously 9 mm. Biapical pleuroparenchymal scarring. Musculoskeletal: Pectus excavatum. No aggressive lytic or blastic lesion of bone. Multilevel degenerative change of the spine. CT ABDOMEN PELVIS FINDINGS Hepatobiliary: Decreased size of a hypodense segment 4 hepatic lesion which was hypermetabolic on PET-CT November 01, 2020, on image 62/2 now measuring 16 x 7 mm previously 22 x 14 mm. No new suspicious hepatic lesion. Gallbladder is unremarkable.  No biliary ductal dilation. Pancreas: No  pancreatic ductal dilation or evidence of acute inflammation. Spleen: No splenomegaly or focal splenic lesion. Adrenals/Urinary Tract: Bilateral adrenal glands appear normal. No hydronephrosis. Kidneys demonstrate symmetric enhancement. Urinary bladder is unremarkable for degree of distension. Stomach/Bowel: Radiopaque enteric contrast material  traverses the rectum. Stomach is unremarkable for degree of distension. Colonic diverticulosis. Prior right hemicolectomy with ileocolic anastomosis. No new suspicious nodularity along the suture line. Vascular/Lymphatic: Decreased size of enlarged retroperitoneal lymph nodes. Previously indexed left retroperitoneal lymph node measures 9 mm in short axis on image 71/2 previously 15 mm. Normal caliber abdominal aorta.  Smooth IVC contours. Reproductive: Uterus and bilateral adnexa are unremarkable. Other: Decreased size of the partially calcified hypodense mass in the central mesentery which extends along the SMA measuring 4.4 x 3.1 cm on image 76/2 previously 5.1 x 3.8 cm. No significant abdominopelvic free fluid. Fat containing ventral hernia. Musculoskeletal: No aggressive lytic or blastic lesion of bone. Multilevel degenerative change of the spine. IMPRESSION: 1. Decreased size of lymph nodes above and below the diaphragm as well as the partially calcified hypodense mass in the central mesentery. 2. Multiple small bilateral solid and ground-glass pulmonary nodules are overall slightly decreased prior examination. No new suspicious pulmonary nodules or masses. 3. Decreased size of the hypodense segment IV hepatic lesion. 4. No evidence of new or progressive disease in the chest, abdomen or pelvis. 5. Prior right hemicolectomy with ileocolic anastomosis. No new suspicious nodularity along the suture line. Electronically Signed   By: Maudry Mayhew M.D.   On: 08/30/2023 15:40

## 2023-09-06 NOTE — Progress Notes (Signed)
 Patient presents today for treatment Amanda Norris) and follow up visit with Dr. Ellin Saba.   Message received from A. Dareen Piano RN / Dr. Ellin Saba proceed with treatment. Vital signs within parameters for treatment. Labs reviewed and within parameters for treatment. Last TSH 1.625 within normal range.

## 2023-09-06 NOTE — Progress Notes (Signed)
 Patient tolerated therapy with no complaints voiced.  Side effects with management reviewed with understanding verbalized.  Port site clean and dry with no bruising or swelling noted at site.  Good blood return noted before and after administration of therapy.  Band aid applied.  Patient left in satisfactory condition with VSS and no s/s of distress noted.

## 2023-09-06 NOTE — Patient Instructions (Signed)
 [facility] [place] - A [facility]PT OF [facility]  Discharge Instructions: Thank you for choosing [facility] to provide your oncology and hematology care.  If you have a lab appointment with the Cancer Center - please note that after April 8th, 2024, all labs will be drawn in the cancer center.  You do not have to check in or register with the main entrance as you have in the past but will complete your check-in in the cancer center.  Wear comfortable clothing and clothing appropriate for easy access to any Portacath or PICC line.   We strive to give you quality time with your provider. You may need to reschedule your appointment if you arrive late (15 or more minutes).  Arriving late affects you and other patients whose appointments are after yours.  Also, if you miss three or more appointments without notifying the office, you may be dismissed from the clinic at the provider's discretion.      For prescription refill requests, have your pharmacy contact our office and allow 72 hours for refills to be completed.    Today you received the following chemotherapy and/or immunotherapy agents Rande Lawman      To help prevent nausea and vomiting after your treatment, we encourage you to take your nausea medication as directed.  BELOW ARE SYMPTOMS THAT SHOULD BE REPORTED IMMEDIATELY: *FEVER GREATER THAN 100.4 F (38 C) OR HIGHER *CHILLS OR SWEATING *NAUSEA AND VOMITING THAT IS NOT CONTROLLED WITH YOUR NAUSEA MEDICATION *UNUSUAL SHORTNESS OF BREATH *UNUSUAL BRUISING OR BLEEDING *URINARY PROBLEMS (pain or burning when urinating, or frequent urination) *BOWEL PROBLEMS (unusual diarrhea, constipation, pain near the anus) TENDERNESS IN MOUTH AND THROAT WITH OR WITHOUT PRESENCE OF ULCERS (sore throat, sores in mouth, or a toothache) UNUSUAL RASH, SWELLING OR PAIN  UNUSUAL VAGINAL DISCHARGE OR ITCHING   Items with * indicate a potential emergency and should be followed up  as soon as possible or go to the Emergency Department if any problems should occur.  Please show the CHEMOTHERAPY ALERT CARD or IMMUNOTHERAPY ALERT CARD at check-in to the Emergency Department and triage nurse.  Should you have questions after your visit or need to cancel or reschedule your appointment, please contact [facility] [place] - A DEPT OF [facility] Bridegroom [facility] [phone]  and follow the prompts.  Office hours are 8:00 a.m. to 4:30 p.m. Monday - Friday. Please note that voicemails left after 4:00 p.m. may not be returned until the following business day.  We are closed weekends and major holidays. You have access to a nurse at all times for urgent questions. Please call the main number to the clinic [phone] and follow the prompts.  For any non-urgent questions, you may also contact your provider using MyChart. We now offer e-Visits for anyone 10 and older to request care online for non-urgent symptoms. For details visit [email or web address].   Also download the MyChart app! Go to the app store, search "MyChart", open the app, select [place], and log in with your MyChart username and password.

## 2023-09-07 ENCOUNTER — Other Ambulatory Visit: Payer: Self-pay

## 2023-09-28 ENCOUNTER — Encounter: Payer: Self-pay | Admitting: Hematology

## 2023-10-09 ENCOUNTER — Other Ambulatory Visit: Payer: Self-pay

## 2023-10-18 ENCOUNTER — Inpatient Hospital Stay: Attending: Hematology

## 2023-10-18 ENCOUNTER — Inpatient Hospital Stay: Payer: Medicare Other | Admitting: Hematology

## 2023-10-18 ENCOUNTER — Inpatient Hospital Stay

## 2023-10-18 ENCOUNTER — Inpatient Hospital Stay: Payer: Medicare Other

## 2023-10-18 VITALS — BP 157/66 | HR 78 | Temp 96.8°F | Resp 18 | Wt 164.5 lb

## 2023-10-18 VITALS — BP 132/55 | HR 57 | Temp 98.0°F | Resp 16

## 2023-10-18 DIAGNOSIS — Z95828 Presence of other vascular implants and grafts: Secondary | ICD-10-CM

## 2023-10-18 DIAGNOSIS — C184 Malignant neoplasm of transverse colon: Secondary | ICD-10-CM | POA: Insufficient documentation

## 2023-10-18 DIAGNOSIS — Z5112 Encounter for antineoplastic immunotherapy: Secondary | ICD-10-CM | POA: Insufficient documentation

## 2023-10-18 DIAGNOSIS — Z79620 Long term (current) use of immunosuppressive biologic: Secondary | ICD-10-CM | POA: Insufficient documentation

## 2023-10-18 DIAGNOSIS — C787 Secondary malignant neoplasm of liver and intrahepatic bile duct: Secondary | ICD-10-CM

## 2023-10-18 LAB — MAGNESIUM: Magnesium: 2.2 mg/dL (ref 1.7–2.4)

## 2023-10-18 LAB — CBC WITH DIFFERENTIAL/PLATELET
Abs Immature Granulocytes: 0.02 10*3/uL (ref 0.00–0.07)
Basophils Absolute: 0 10*3/uL (ref 0.0–0.1)
Basophils Relative: 1 %
Eosinophils Absolute: 0.1 10*3/uL (ref 0.0–0.5)
Eosinophils Relative: 2 %
HCT: 41.2 % (ref 36.0–46.0)
Hemoglobin: 13.4 g/dL (ref 12.0–15.0)
Immature Granulocytes: 0 %
Lymphocytes Relative: 18 %
Lymphs Abs: 1.3 10*3/uL (ref 0.7–4.0)
MCH: 29.9 pg (ref 26.0–34.0)
MCHC: 32.5 g/dL (ref 30.0–36.0)
MCV: 92 fL (ref 80.0–100.0)
Monocytes Absolute: 0.8 10*3/uL (ref 0.1–1.0)
Monocytes Relative: 11 %
Neutro Abs: 4.8 10*3/uL (ref 1.7–7.7)
Neutrophils Relative %: 68 %
Platelets: 175 10*3/uL (ref 150–400)
RBC: 4.48 MIL/uL (ref 3.87–5.11)
RDW: 13 % (ref 11.5–15.5)
WBC: 7 10*3/uL (ref 4.0–10.5)
nRBC: 0 % (ref 0.0–0.2)

## 2023-10-18 LAB — COMPREHENSIVE METABOLIC PANEL WITH GFR
ALT: 18 U/L (ref 0–44)
AST: 24 U/L (ref 15–41)
Albumin: 3.9 g/dL (ref 3.5–5.0)
Alkaline Phosphatase: 63 U/L (ref 38–126)
Anion gap: 10 (ref 5–15)
BUN: 13 mg/dL (ref 8–23)
CO2: 24 mmol/L (ref 22–32)
Calcium: 9.1 mg/dL (ref 8.9–10.3)
Chloride: 102 mmol/L (ref 98–111)
Creatinine, Ser: 0.62 mg/dL (ref 0.44–1.00)
GFR, Estimated: >60 mL/min (ref >60)
Glucose, Bld: 94 mg/dL (ref 70–99)
Potassium: 3.9 mmol/L (ref 3.5–5.1)
Sodium: 136 mmol/L (ref 135–145)
Total Bilirubin: 0.7 mg/dL (ref 0.0–1.2)
Total Protein: 7.8 g/dL (ref 6.5–8.1)

## 2023-10-18 MED ORDER — PEMBROLIZUMAB CHEMO INJECTION 100 MG/4ML
400.0000 mg | Freq: Once | INTRAVENOUS | Status: AC
  Administered 2023-10-18: 400 mg via INTRAVENOUS
  Filled 2023-10-18: qty 16

## 2023-10-18 MED ORDER — SODIUM CHLORIDE 0.9% FLUSH
10.0000 mL | INTRAVENOUS | Status: DC | PRN
Start: 2023-10-18 — End: 2023-10-18
  Administered 2023-10-18: 10 mL via INTRAVENOUS

## 2023-10-18 MED ORDER — SODIUM CHLORIDE 0.9 % IV SOLN: Freq: Once | INTRAVENOUS | Status: AC

## 2023-10-18 MED ORDER — HEPARIN SOD (PORK) LOCK FLUSH 100 UNIT/ML IV SOLN
500.0000 [IU] | Freq: Once | INTRAVENOUS | Status: AC | PRN
  Administered 2023-10-18: 500 [IU]

## 2023-10-18 MED ORDER — SODIUM CHLORIDE 0.9% FLUSH
10.0000 mL | INTRAVENOUS | Status: DC | PRN
  Administered 2023-10-18: 10 mL

## 2023-10-18 NOTE — Patient Instructions (Signed)
 [facility] [place] - A [facility] OF [facility]  Discharge Instructions: Thank you for choosing [facility] to provide your oncology and hematology care.  If you have a lab appointment with the Cancer Center - please note that after April 8th, 2024, all labs will be drawn in the cancer center.  You do not have to check in or register with the main entrance as you have in the past but will complete your check-in in the cancer center.  Wear comfortable clothing and clothing appropriate for easy access to any Portacath or PICC line.   We strive to give you quality time with your provider. You may need to reschedule your appointment if you arrive late (15 or more minutes).  Arriving late affects you and other patients whose appointments are after yours.  Also, if you miss three or more appointments without notifying the office, you may be dismissed from the clinic at the provider's discretion.      For prescription refill requests, have your pharmacy contact our office and allow 72 hours for refills to be completed.    Today you received the following chemotherapy and/or immunotherapy agents Keytruda   To help prevent nausea and vomiting after your treatment, we encourage you to take your nausea medication as directed.  Pembrolizumab Injection What is this medication? PEMBROLIZUMAB (PEM broe LIZ ue mab) treats some types of cancer. It works by helping your immune system slow or stop the spread of cancer cells. It is a monoclonal antibody. This medicine may be used for other purposes; ask your health care provider or pharmacist if you have questions. COMMON BRAND NAME(S): Keytruda What should I tell my care team before I take this medication? They need to know if you have any of these conditions: Allogeneic stem cell transplant (uses someone else's stem cells) Autoimmune diseases, such as Crohn disease, ulcerative colitis, lupus History of chest radiation Nervous  system problems, such as Guillain-Barre syndrome, myasthenia gravis Organ transplant An unusual or allergic reaction to pembrolizumab, other medications, foods, dyes, or preservatives Pregnant or trying to get pregnant Breast-feeding How should I use this medication? This medication is injected into a vein. It is given by your care team in a hospital or clinic setting. A special MedGuide will be given to you before each treatment. Be sure to read this information carefully each time. Talk to your care team about the use of this medication in children. While it may be prescribed for children as young as 6 months for selected conditions, precautions do apply. Overdosage: If you think you have taken too much of this medicine contact a poison control center or emergency room at once. NOTE: This medicine is only for you. Do not share this medicine with others. What if I miss a dose? Keep appointments for follow-up doses. It is important not to miss your dose. Call your care team if you are unable to keep an appointment. What may interact with this medication? Interactions have not been studied. This list may not describe all possible interactions. Give your health care provider a list of all the medicines, herbs, non-prescription drugs, or dietary supplements you use. Also tell them if you smoke, drink alcohol, or use illegal drugs. Some items may interact with your medicine. What should I watch for while using this medication? Your condition will be monitored carefully while you are receiving this medication. You may need blood work while taking this medication. This medication may cause  serious skin reactions. They can happen weeks to months after starting the medication. Contact your care team right away if you notice fevers or flu-like symptoms with a rash. The rash may be red or purple and then turn into blisters or peeling of the skin. You may also notice a red rash with swelling of the face,  lips, or lymph nodes in your neck or under your arms. Tell your care team right away if you have any change in your eyesight. Talk to your care team if you may be pregnant. Serious birth defects can occur if you take this medication during pregnancy and for 4 months after the last dose. You will need a negative pregnancy test before starting this medication. Contraception is recommended while taking this medication and for 4 months after the last dose. Your care team can help you find the option that works for you. Do not breastfeed while taking this medication and for 4 months after the last dose. What side effects may I notice from receiving this medication? Side effects that you should report to your care team as soon as possible: Allergic reactions--skin rash, itching, hives, swelling of the face, lips, tongue, or throat Dry cough, shortness of breath or trouble breathing Eye pain, redness, irritation, or discharge with blurry or decreased vision Heart muscle inflammation--unusual weakness or fatigue, shortness of breath, chest pain, fast or irregular heartbeat, dizziness, swelling of the ankles, feet, or hands Hormone gland problems--headache, sensitivity to light, unusual weakness or fatigue, dizziness, fast or irregular heartbeat, increased sensitivity to cold or heat, excessive sweating, constipation, hair loss, increased thirst or amount of urine, tremors or shaking, irritability Infusion reactions--chest pain, shortness of breath or trouble breathing, feeling faint or lightheaded Kidney injury (glomerulonephritis)--decrease in the amount of urine, red or dark brown urine, foamy or bubbly urine, swelling of the ankles, hands, or feet Liver injury--right upper belly pain, loss of appetite, nausea, light-colored stool, dark yellow or brown urine, yellowing skin or eyes, unusual weakness or fatigue Pain, tingling, or numbness in the hands or feet, muscle weakness, change in vision, confusion or  trouble speaking, loss of balance or coordination, trouble walking, seizures Rash, fever, and swollen lymph nodes Redness, blistering, peeling, or loosening of the skin, including inside the mouth Sudden or severe stomach pain, bloody diarrhea, fever, nausea, vomiting Side effects that usually do not require medical attention (report to your care team if they continue or are bothersome): Bone, joint, or muscle pain Diarrhea Fatigue Loss of appetite Nausea Skin rash This list may not describe all possible side effects. Call your doctor for medical advice about side effects. You may report side effects to FDA at 1-800-FDA-1088. Where should I keep my medication? This medication is given in a hospital or clinic. It will not be stored at home. NOTE: This sheet is a summary. It may not cover all possible information. If you have questions about this medicine, talk to your doctor, pharmacist, or health care provider.  2024 Elsevier/Gold Standard (2021-11-08 00:00:00)  BELOW ARE SYMPTOMS THAT SHOULD BE REPORTED IMMEDIATELY: *FEVER GREATER THAN 100.4 F (38 C) OR HIGHER *CHILLS OR SWEATING *NAUSEA AND VOMITING THAT IS NOT CONTROLLED WITH YOUR NAUSEA MEDICATION *UNUSUAL SHORTNESS OF BREATH *UNUSUAL BRUISING OR BLEEDING *URINARY PROBLEMS (pain or burning when urinating, or frequent urination) *BOWEL PROBLEMS (unusual diarrhea, constipation, pain near the anus) TENDERNESS IN MOUTH AND THROAT WITH OR WITHOUT PRESENCE OF ULCERS (sore throat, sores in mouth, or a toothache) UNUSUAL RASH, SWELLING OR PAIN  UNUSUAL VAGINAL DISCHARGE OR ITCHING   Items with * indicate a potential emergency and should be followed up as soon as possible or go to the Emergency Department if any problems should occur.  Please show the CHEMOTHERAPY ALERT CARD or IMMUNOTHERAPY ALERT CARD at check-in to the Emergency Department and triage nurse.  Should you have questions after your visit or need to cancel or reschedule  your appointment, please contact [facility] [place] - A DEPT OF [facility] [phone]  and follow the prompts.  Office hours are 8:00 a.m. to 4:30 p.m. Monday - Friday. Please note that voicemails left after 4:00 p.m. may not be returned until the following business day.  We are closed weekends and major holidays. You have access to a nurse at all times for urgent questions. Please call the main number to the clinic [phone] and follow the prompts.  For any non-urgent questions, you may also contact your provider using MyChart. We now offer e-Visits for anyone 43 and older to request care online for non-urgent symptoms. For details visit [email or web address].   Also download the MyChart app! Go to the app store, search "MyChart", open the app, select [place], and log in with your MyChart username and password.

## 2023-10-18 NOTE — Progress Notes (Signed)
 Patients port flushed without difficulty.  Good blood return noted with no bruising or swelling noted at site.  Patient remains accessed for treatment.

## 2023-10-18 NOTE — Progress Notes (Signed)
Patient presents today for Keytruda infusion.  Patient is in satisfactory condition with no new complaints voiced.  Vital signs are stable.  Labs reviewed and all labs are within treatment parameters.  We will proceed with treatment per MD orders.    Treatment given today per MD orders. Tolerated infusion without adverse affects. Vital signs stable. No complaints at this time. Discharged from clinic ambulatory in stable condition. Alert and oriented x 3. F/U with [facility] as scheduled.

## 2023-11-19 IMAGING — CT CT CHEST-ABD-PELV W/ CM
2 of 5 series · 11 of 36 positions shown, 16 images · IV contrast (agent unspecified)
Comparison: 03/31/2021

CLINICAL DATA: Follow-up metastatic colon carcinoma. Assess
treatment response.

EXAM:
CT CHEST, ABDOMEN, AND PELVIS WITH CONTRAST
TECHNIQUE: Multidetector CT imaging of the chest, abdomen and pelvis was
performed following the standard protocol during bolus
administration of intravenous contrast.

[Series 2: cap with · axial · 0.77mm/px · z∈[+945,+1460]mm · 8 of 133 slices shown, 13 images]
[im 15/133  mediastinal]
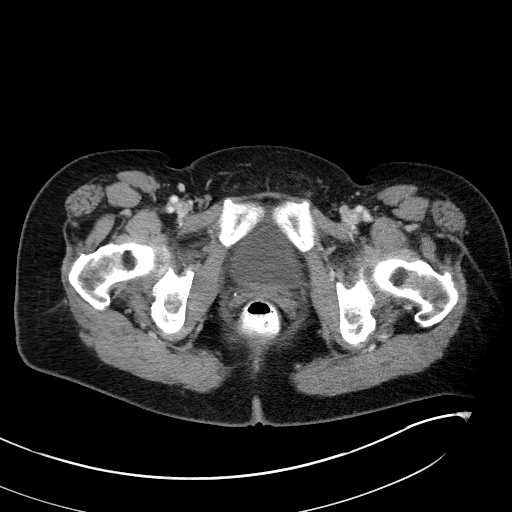
[im 15/133  bone]
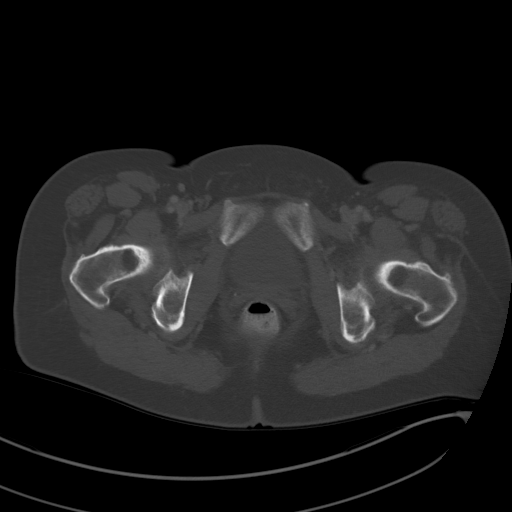
[im 30/133  mediastinal]
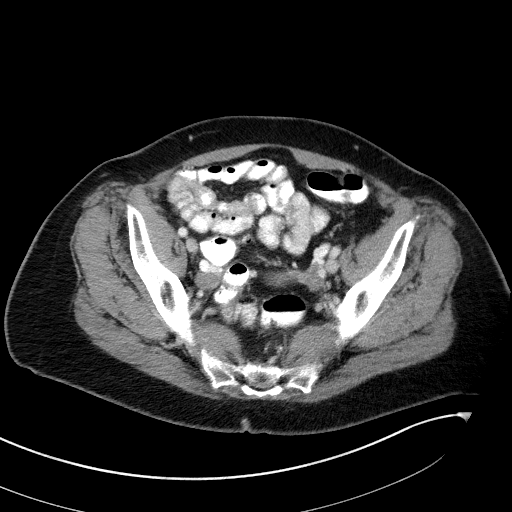
[im 45/133  mediastinal]
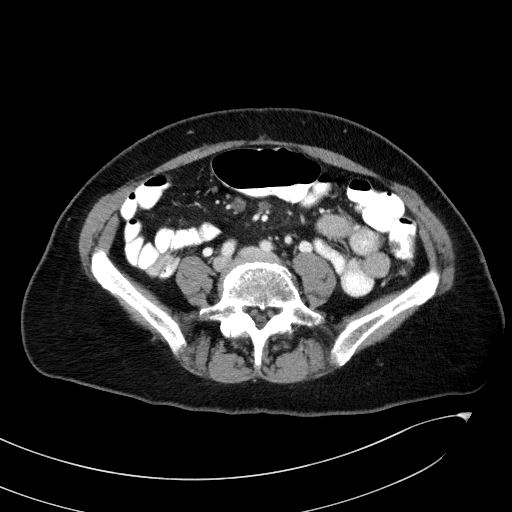
[im 59/133  mediastinal]
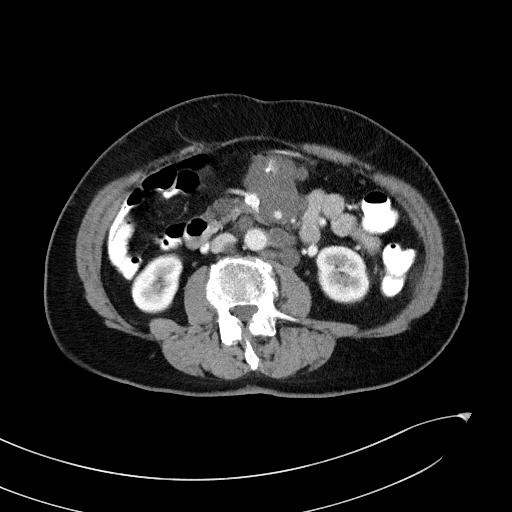
[im 74/133  mediastinal]
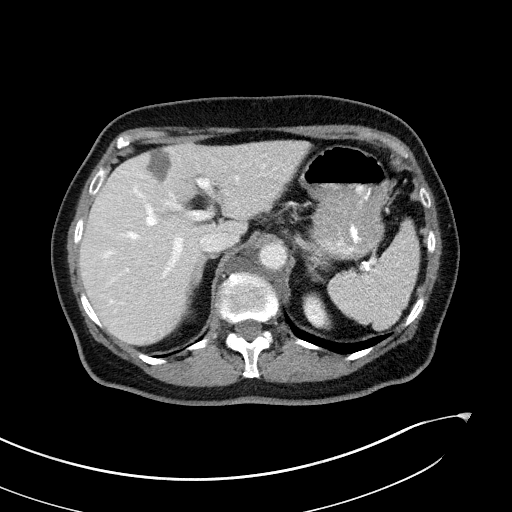
[im 74/133  lung]
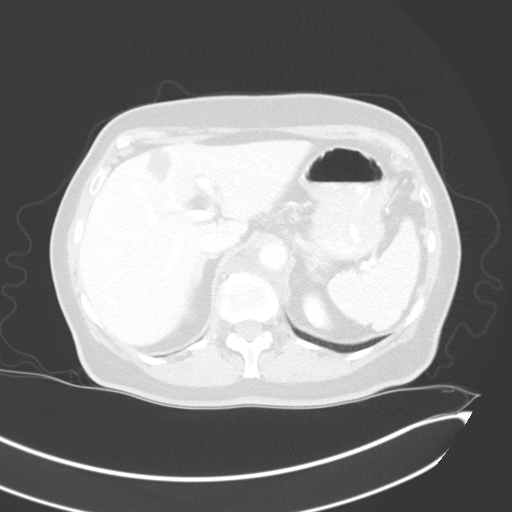
[im 89/133  mediastinal]
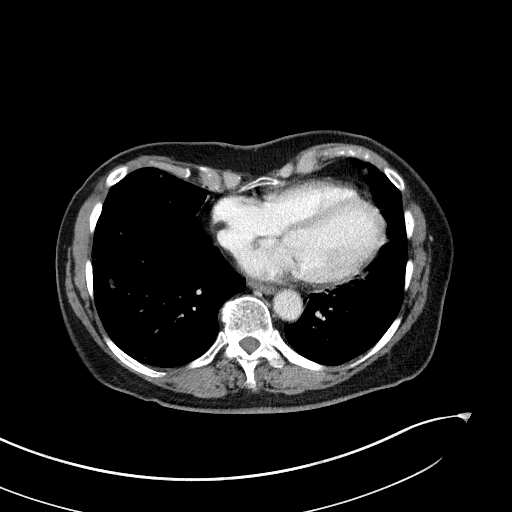
[im 89/133  lung]
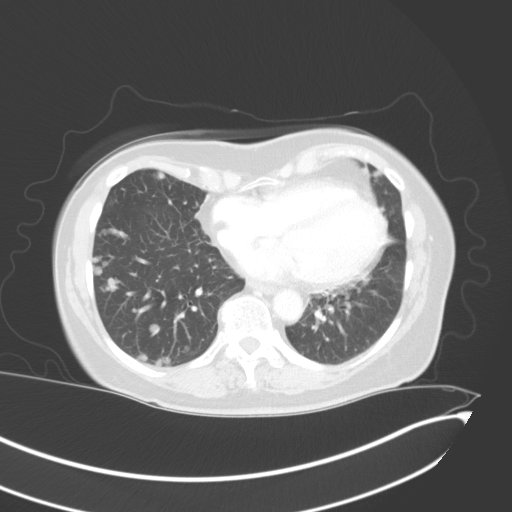
[im 103/133  mediastinal]
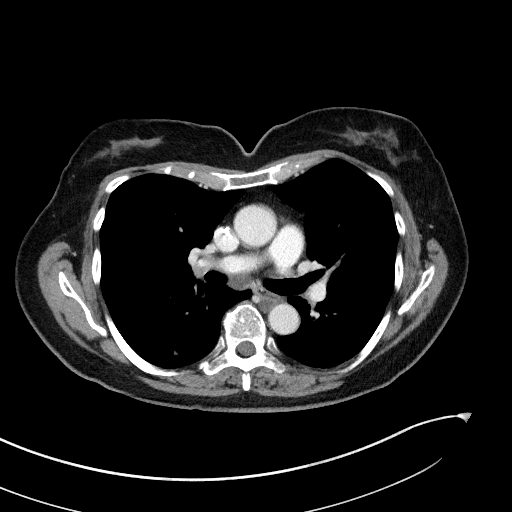
[im 103/133  lung]
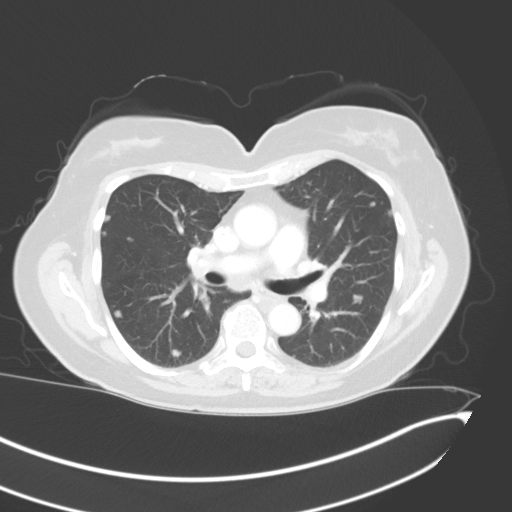
[im 118/133  mediastinal]
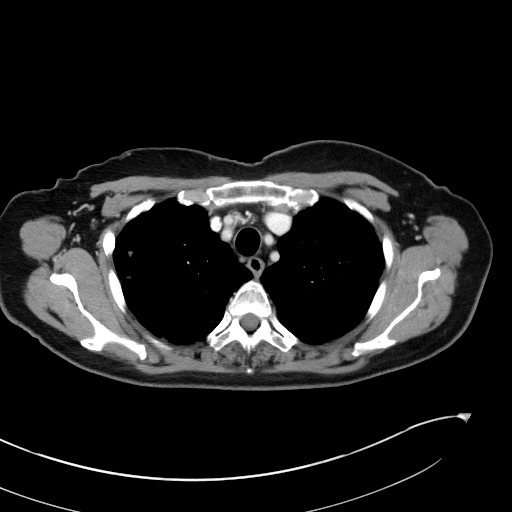
[im 118/133  lung]
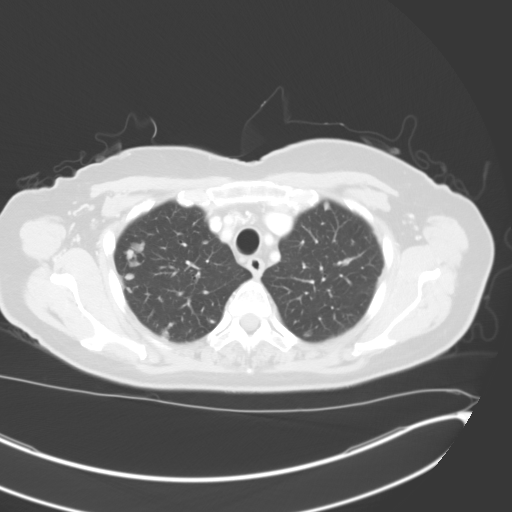

[Series 5: coronals · coronal · 0.84mm/px · 3 of 137 slices shown]
[im 28/137  mediastinal]
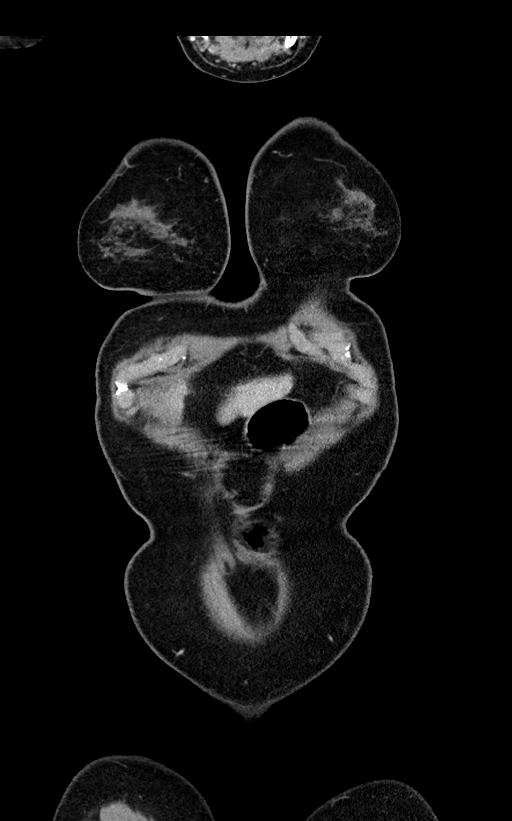
[im 55/137  mediastinal]
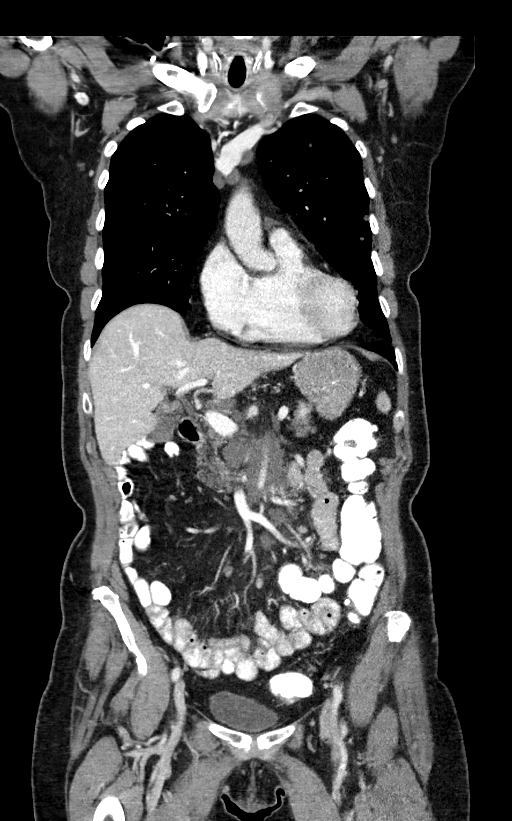
[im 82/137  mediastinal]
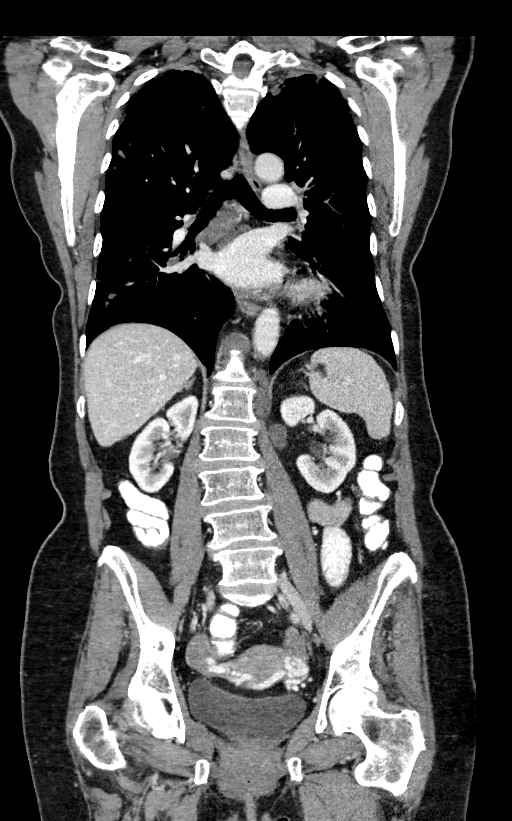

[11 of 36 positions shown; findings below may reference images not displayed]

RADIATION DOSE REDUCTION: This exam was performed according to the
departmental dose-optimization program which includes automated
exposure control, adjustment of the mA and/or kV according to
patient size and/or use of iterative reconstruction technique.

CONTRAST:  100mL OMNIPAQUE IOHEXOL 300 MG/ML  SOLN
FINDINGS: CT CHEST FINDINGS

Cardiovascular: No acute findings.

Mediastinum/Lymph Nodes: Low-attenuation lymphadenopathy in the left
supraclavicular region and throughout the mediastinum is again seen,
without significant change. Largest lymph nodes are located in the
left supraclavicular region measuring 2.1 cm short axis, and the
precarinal region measuring 1.7 cm short axis, without significant
change since prior exam. Lymphadenopathy in the lower middle
mediastinum along the medial aspect of the descending aorta also
shows no significant change, measuring 1.9 cm short axis compared to
2.0 cm previously. No new or increased sites of lymphadenopathy
identified.

Lungs/Pleura: Innumerable small bilateral pulmonary nodules are
again seen throughout both lungs. These show no significant change
in size or number, consistent with diffuse pulmonary metastases. No
evidence of pulmonary consolidation or pleural effusion.

Musculoskeletal:  No suspicious bone lesions identified.

CT ABDOMEN AND PELVIS FINDINGS

Hepatobiliary: 2.5 x 1.9 cm low-attenuation lesion in segment 4 B of
the left lobe shows no significant change. No new or enlarging liver
lesions identified. Gallbladder is unremarkable. No evidence of
biliary ductal dilatation.

Pancreas:  No mass or inflammatory changes.

Spleen:  Within normal limits in size and appearance.

Adrenals/Urinary tract:  No masses or hydronephrosis.

Stomach/Bowel: Prior right hemicolectomy again noted. No evidence of
obstruction, inflammatory process, or abnormal fluid collections.
Diverticulosis is seen mainly involving the descending and sigmoid
colon, however there is no evidence of diverticulitis.

Vascular/Lymphatic: Low-attenuation lymphadenopathy is again seen
throughout the bilateral retrocrural spaces, aortocaval and left
paraaortic spaces, and the central small bowel mesentery. Largest
area lymphadenopathy in the central mesentery measures 5.9 x 4.2 cm
on image 76/2, without significant change compared to prior study.
No new or increased sites of lymphadenopathy identified within the
abdomen or pelvis.

Reproductive:  No mass or other significant abnormality identified.

Other:  None.

Musculoskeletal:  No suspicious bone lesions identified.
IMPRESSION: Stable low-attenuation lymphadenopathy throughout the chest and
abdomen.

Stable diffuse bilateral pulmonary metastases.

Stable small low-attenuation left hepatic lobe lesion.

No evidence of new or progressive metastatic disease.

Colonic diverticulosis, without radiographic evidence of
diverticulitis.

## 2023-11-29 ENCOUNTER — Inpatient Hospital Stay: Payer: Medicare Other

## 2023-11-29 ENCOUNTER — Inpatient Hospital Stay: Payer: Medicare Other | Attending: Hematology

## 2023-11-29 ENCOUNTER — Encounter: Payer: Self-pay | Admitting: Hematology

## 2023-11-29 ENCOUNTER — Inpatient Hospital Stay (HOSPITAL_BASED_OUTPATIENT_CLINIC_OR_DEPARTMENT_OTHER): Payer: Medicare Other | Admitting: Hematology

## 2023-11-29 VITALS — BP 163/86

## 2023-11-29 VITALS — BP 139/66 | HR 57 | Temp 98.2°F | Resp 17

## 2023-11-29 DIAGNOSIS — C787 Secondary malignant neoplasm of liver and intrahepatic bile duct: Secondary | ICD-10-CM | POA: Diagnosis not present

## 2023-11-29 DIAGNOSIS — Z79620 Long term (current) use of immunosuppressive biologic: Secondary | ICD-10-CM | POA: Diagnosis not present

## 2023-11-29 DIAGNOSIS — C7801 Secondary malignant neoplasm of right lung: Secondary | ICD-10-CM | POA: Diagnosis not present

## 2023-11-29 DIAGNOSIS — C778 Secondary and unspecified malignant neoplasm of lymph nodes of multiple regions: Secondary | ICD-10-CM | POA: Insufficient documentation

## 2023-11-29 DIAGNOSIS — C184 Malignant neoplasm of transverse colon: Secondary | ICD-10-CM | POA: Diagnosis not present

## 2023-11-29 DIAGNOSIS — C189 Malignant neoplasm of colon, unspecified: Secondary | ICD-10-CM | POA: Diagnosis not present

## 2023-11-29 DIAGNOSIS — C7802 Secondary malignant neoplasm of left lung: Secondary | ICD-10-CM | POA: Insufficient documentation

## 2023-11-29 DIAGNOSIS — Z95828 Presence of other vascular implants and grafts: Secondary | ICD-10-CM

## 2023-11-29 DIAGNOSIS — Z5112 Encounter for antineoplastic immunotherapy: Secondary | ICD-10-CM | POA: Insufficient documentation

## 2023-11-29 LAB — COMPREHENSIVE METABOLIC PANEL WITH GFR
ALT: 18 U/L (ref 0–44)
AST: 23 U/L (ref 15–41)
Albumin: 4.1 g/dL (ref 3.5–5.0)
Alkaline Phosphatase: 68 U/L (ref 38–126)
Anion gap: 10 (ref 5–15)
BUN: 15 mg/dL (ref 8–23)
CO2: 24 mmol/L (ref 22–32)
Calcium: 9.6 mg/dL (ref 8.9–10.3)
Chloride: 100 mmol/L (ref 98–111)
Creatinine, Ser: 0.6 mg/dL (ref 0.44–1.00)
GFR, Estimated: >60 mL/min (ref >60)
Glucose, Bld: 91 mg/dL (ref 70–99)
Potassium: 3.9 mmol/L (ref 3.5–5.1)
Sodium: 134 mmol/L — ABNORMAL LOW (ref 135–145)
Total Bilirubin: 0.6 mg/dL (ref 0.0–1.2)
Total Protein: 7.9 g/dL (ref 6.5–8.1)

## 2023-11-29 LAB — CBC WITH DIFFERENTIAL/PLATELET
Abs Immature Granulocytes: 0.03 10*3/uL (ref 0.00–0.07)
Basophils Absolute: 0 10*3/uL (ref 0.0–0.1)
Basophils Relative: 1 %
Eosinophils Absolute: 0.2 10*3/uL (ref 0.0–0.5)
Eosinophils Relative: 2 %
HCT: 38.9 % (ref 36.0–46.0)
Hemoglobin: 13 g/dL (ref 12.0–15.0)
Immature Granulocytes: 0 %
Lymphocytes Relative: 18 %
Lymphs Abs: 1.2 10*3/uL (ref 0.7–4.0)
MCH: 30.4 pg (ref 26.0–34.0)
MCHC: 33.4 g/dL (ref 30.0–36.0)
MCV: 91.1 fL (ref 80.0–100.0)
Monocytes Absolute: 0.9 10*3/uL (ref 0.1–1.0)
Monocytes Relative: 13 %
Neutro Abs: 4.4 10*3/uL (ref 1.7–7.7)
Neutrophils Relative %: 66 %
Platelets: 186 10*3/uL (ref 150–400)
RBC: 4.27 MIL/uL (ref 3.87–5.11)
RDW: 13.2 % (ref 11.5–15.5)
WBC: 6.7 10*3/uL (ref 4.0–10.5)
nRBC: 0 % (ref 0.0–0.2)

## 2023-11-29 LAB — MAGNESIUM: Magnesium: 2.3 mg/dL (ref 1.7–2.4)

## 2023-11-29 MED ORDER — SODIUM CHLORIDE 0.9% FLUSH
10.0000 mL | INTRAVENOUS | Status: DC | PRN
  Administered 2023-11-29: 10 mL

## 2023-11-29 MED ORDER — HEPARIN SOD (PORK) LOCK FLUSH 100 UNIT/ML IV SOLN
500.0000 [IU] | Freq: Once | INTRAVENOUS | Status: AC | PRN
  Administered 2023-11-29: 500 [IU]

## 2023-11-29 MED ORDER — SODIUM CHLORIDE 0.9 % IV SOLN: Freq: Once | INTRAVENOUS | Status: AC

## 2023-11-29 MED ORDER — SODIUM CHLORIDE 0.9% FLUSH
10.0000 mL | INTRAVENOUS | Status: DC | PRN
  Administered 2023-11-29: 10 mL via INTRAVENOUS

## 2023-11-29 MED ORDER — SODIUM CHLORIDE 0.9 % IV SOLN
400.0000 mg | Freq: Once | INTRAVENOUS | Status: AC
  Administered 2023-11-29: 400 mg via INTRAVENOUS
  Filled 2023-11-29: qty 16

## 2023-11-29 NOTE — Patient Instructions (Signed)

## 2023-11-29 NOTE — Progress Notes (Signed)
 [facility] [address] [place] [address]    Clinic Day:  11/29/23   Referring physician: Kathyleen Parkins, MD  Patient Care Team: Kathyleen Parkins, MD as PCP - General (Internal Medicine) Alanda Allegra, MD as Consulting Physician (General Surgery) Paulett Boros, MD as Medical Oncologist (Medical Oncology)   ASSESSMENT & PLAN:   Assessment: 1.  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 - Status post 3 cycles of FOLFOX from 02/10/2014 through 03/10/2014, discontinued secondary to poor tolerance.  DPD was negative. -Last colonoscopy on 03/28/2018 patent ileo-colonic anastomosis.  8 mm polyp in the transverse colon.  Diverticulosis in the sigmoid colon, descending colon. - CT scan on 11/04/2018 shows postoperative findings of right colectomy with redemonstrated mesenteric nodule/lymph node measuring 1.9 x 1.7 cm, unchanged from prior scan in October 2019.  No evidence of metastatic disease.  Stable solid and groundglass pulmonary nodules, stable over multiple prior exams. -CEA was 4.6 on 10/28/2018. -CTAP on 09/21/2020 showed numerous small pulmonary nodules throughout the lung bases, 5 mm.  Enlarged retrocrural periaortic lymph node in the lower chest.  Soft tissue nodule within the central mesentery substantially increased in size, encasing proximal superior mesenteric artery and measuring 5 x 4.3 cm.  Numerous retroperitoneal lymph nodes, largest aortocaval lymph node measuring 2.8 x 2.2 cm. -CT chest on 09/27/2020 shows a left thoracic inlet lymph node approximately 2.3 cm.  Mediastinal lymph nodes and multiple subcentimeter pulmonary nodules suggestive of metastatic disease. -Mesenteric lymph node biopsy on 09/28/2020 shows mucin and fibrosis. -Left supraclavicular lymph node biopsy on 10/14/2020-soft tissue with abundant  mucin, rare fragments of atypical columnar epithelium. - 3 cycles of FOLFIRI and bevacizumab  dose reduced from 11/10/2020 through 12/08/2020. - Caris test-BRAF V600 E+, MMR deficient, MSI high, TMB high, HER2 negative, BRCA1/2 pathogenic variant on exon 14 and exon 9 respectively.  The report also suggested decreased benefit to FOLFOX and bevacizumab  in the first-line metastatic setting. - Cycle 1 of Keytruda  on 01/13/2021, switch to every 6 weeks regimen on 03/22/2023    Plan: 1.  Metastatic colon cancer to the liver, lungs and lymph nodes, BRAF V600E+: - CT CAP (08/30/2023): Decrease in size of lymph nodes above and below the diaphragm as well as partially calcified hypodense mass in the central mesentery.  Multiple bilateral small solid groundglass nodules decreased in size.  Decrease in size of the hypodense segment 4 hepatic lesion.  No new metastatic disease. - She is tolerating Keytruda  without any significant side effects. - Reviewed labs: Normal LFTs and creatinine.  CBC grossly normal. - Recommend continue Keytruda  every 6 weeks.  RTC in the first week of August with CT CAP with contrast.   2.  Diarrhea: - She has baseline diarrhea since surgery.  However she reports the diarrhea is worse in the last 4 to 6 weeks.  She is taking Imodium  1 tablet 2-3 times daily.  Sometimes she gets up to 10 soft stools per day.  Recommend taking Imodium  2 tablets at the first onset of diarrhea.  Will also consider Questran down the line if no improvement.    Orders Placed This Encounter  Procedures   CT CHEST ABDOMEN PELVIS W CONTRAST    Standing Status:   Future    Expected Date:   02/04/2024    Expiration Date:   11/28/2024    If indicated for the ordered procedure,  I authorize the administration of contrast media per Radiology protocol:   Yes    Does the patient have a contrast media/X-ray dye allergy?:   No    Preferred imaging location?:   [facility]    If indicated for the ordered procedure,  I authorize the administration of oral contrast media per Radiology protocol:   Yes      I,Helena R Teague,acting as a scribe for Paulett Boros, MD.,have documented all relevant documentation on the behalf of Paulett Boros, MD,as directed by  Paulett Boros, MD while in the presence of Paulett Boros, MD.  I, Paulett Boros MD, have reviewed the above documentation for accuracy and completeness, and I agree with the above.      Paulett Boros, MD   5/22/20252:29 PM  CHIEF COMPLAINT:   Diagnosis: metastatic transverse colon adenocarcinoma    Cancer Staging  No matching staging information was found for the patient.    Prior Therapy: 1. Right hemicolectomy on 12/29/2013. 2. FOLFOX x 3 cycles from 02/10/2014 to 03/10/2014, stopped secondary to poor tolerance  Current Therapy:  Keytruda  every 6 weeks    HISTORY OF PRESENT ILLNESS:   Oncology History  Adenocarcinoma of transverse colon (HCC) (Resolved)  12/09/2013 Initial Diagnosis   1. Colon, biopsy, proximal transverse mass INVASIVE ADENOCARCINOMA.   12/29/2013 Definitive Surgery   Colon, segmental resection for tumor, right - INVASIVE COLORECTAL ADENOCARCINOMA, 7 CM, EXTENDING INTO PERICOLONIC CONNECTIVE TISSUE. - METASTATIC CARCINOMA IN 6 OF 33 LYMPH NODES (6/33).   02/10/2014 - 03/10/2014 Adjuvant Chemotherapy   FOLFOX x 3 cycles   03/11/2014 Adverse Reaction   Patient called reporting diarrhea despite dose reduction and she wants to stop therapy.  Patient seen on 03/24/14 to discuss other treatment options and she stands by her decision to stop all therapy.   04/01/2014 Surgery   Port-a-cath removal by Dr. Larrie Po.   01/12/2015 PET scan   Mild abdominal mesenteric LAD show hypermetabolic activity, 7 mm indeterminate pulm nodule in sup segment of RLL shows no assoc metabolic activity   03/24/2015 PET scan   Mild progression of nodal mets in anterior mid abdominal mesentery, new nodal mets at L  thoracic inlet. stable 6 mm nodule in posterior RLL, unchanged since 2015   06/22/2015 PET scan   Resolution of metabolic activity and decreased size of mild LAD at L thoracic inlet, stable mild mid abd hypermetabolic mesenteric LAD, stable 6 mm posterior LLL pulm nodule without metabolic activity   12/22/2015 Imaging   MRI lumbar spine L4-L5 disc degenerated, broad based disc hernation, B facet arthropathy with hypertophy and edema, stenosis of lateral recesses that could cause neural compression   10/18/2016 Imaging   CT CAP- 1. Several small ground-glass pulmonary nodules are present in the lungs and are stable over the past 9 months, but several are mildly larger than they were in 2015. Low-grade adenocarcinoma can sometimes have this appearance and surveillance is likely warranted. 2. There is a cluster of nodal tissue in the central mesentery, maximum short axis diameter 1.3 cm. This nodal cluster is relatively low in density and not appreciably changed from the prior exam. Given the lack of change is may represent quiescent residua of malignancy, but again, surveillance is likely warranted. 3. The left-sided rib fractures are more numerous than was revealed on the prior radiographs ; there are nondisplaced healing fractures of the left fifth, sixth, seventh, eighth, ninth, and tenth ribs. 4. Mild cardiomegaly although part of this appearance may be due  to pectus excavatum. 5. Several additional small pulmonary nodules are stable from 2015 and considered benign. 6. Right hemicolectomy and sigmoid colon diverticulosis. 7. Small right paraumbilical hernia containing adipose tissue. 8. Lower lumbar spondylosis and degenerative disc disease potentially with mild impingement at L4-5.   04/25/2017 Imaging   CT C/A/P: Scattered solid pulmonary nodules measure up to 6 mm in the right lower lobe, unchanged. There are a few scattered ground-glass nodules measuring up to 8 mm in the right  upper lobe, also stable. Distal perigastric lymph nodes are stable. No additional evidence of metastatic disease.   Metastatic colon cancer to liver (HCC)  11/04/2020 Initial Diagnosis   Metastatic colon cancer to liver (HCC)   11/10/2020 - 12/10/2020 Chemotherapy         01/13/2021 - 03/02/2022 Chemotherapy   Patient is on Treatment Plan : COLORECTAL Pembrolizumab  q21d     01/13/2021 -  Chemotherapy   Patient is on Treatment Plan : COLORECTAL Pembrolizumab  (400) q42 days        INTERVAL HISTORY:   Amanda Norris is a 78 y.o. female presenting to clinic today for follow up of metastatic transverse colon adenocarcinoma. She was last seen by me on 09/06/23.  Today, she states that she is doing well overall. Her appetite level is at 100%. Her energy level is at 25%.  PAST MEDICAL HISTORY:   Past Medical History: Past Medical History:  Diagnosis Date   Arthritis    wrist and thumbs   Colon cancer (HCC)    PONV (postoperative nausea and vomiting)    Port-A-Cath in place 11/09/2020   Ruptured lumbar disc    L1-L5 per patient report   Sciatica of left side     Surgical History: Past Surgical History:  Procedure Laterality Date   COLONOSCOPY N/A 12/12/2013   Procedure: COLONOSCOPY;  Surgeon: Ruby Corporal, MD;  [facility];  Service: Endoscopy;  Laterality: N/A;  730   COLONOSCOPY N/A 01/20/2015   Procedure: COLONOSCOPY;  Surgeon: Ruby Corporal, MD;  Location: [facility];  Service: Endoscopy;  Laterality: N/A;  1030   COLONOSCOPY N/A 03/28/2018   Procedure: COLONOSCOPY;  Surgeon: Ruby Corporal, MD;  Location: [facility];  Service: Endoscopy;  Laterality: N/A;  1015   EUS N/A 12/18/2013   Procedure: UPPER ENDOSCOPIC ULTRASOUND (EUS) LINEAR;  Surgeon: Janel Medford, MD;  Location: [facility];  Service: Endoscopy;  Laterality: N/A;   herniated disc     [id number]   IR IMAGING GUIDED PORT INSERTION  10/14/2020   IR US  GUIDANCE  10/14/2020   PARTIAL COLECTOMY N/A 12/29/2013    Procedure: RIGHT HEMICOLECTOMY;  Surgeon: Beau Bound, MD;  Location: [facility];  Service: General;  Laterality: N/A;   POLYPECTOMY  03/28/2018   Procedure: POLYPECTOMY;  Surgeon: Ruby Corporal, MD;  [facility]: [facility];  Service: Endoscopy;;  transverse colon (hot snare);   PORT-A-CATH REMOVAL Right 04/01/2014   Procedure: MINOR REMOVAL PORT-A-CATH;  Surgeon: Beau Bound, MD;  Location: [facility];  Service: General;  Laterality: Right;   PORTACATH PLACEMENT Right 02/04/2014   Procedure: INSERTION PORT-A-CATH;  Surgeon: Beau Bound, MD;  Location: [facility];  Service: General;  Laterality: Right;    Social History: Social History   Socioeconomic History   Marital status: Married    Spouse name: Not on file   Number of children: Not on file   Years of education: Not on file   Highest education level: Not on  file  Occupational History   Not on file  Tobacco Use   Smoking status: Former    Current packs/day: 0.00    Average packs/day: 0.3 packs/day for 5.0 years (1.3 ttl pk-yrs)    Types: Cigarettes    Start date: 04/18/1954    Quit date: 04/19/1959    Years since quitting: 64.6   Smokeless tobacco: Never   Tobacco comments:    smoked for 5 years as teenager  Vaping Use   Vaping status: Never Used  Substance and Sexual Activity   Alcohol use: No   Drug use: No   Sexual activity: Yes    Birth control/protection: Post-menopausal  Other Topics Concern   Not on file  Social History Narrative   Not on file   Social Drivers of Health   Financial Resource Strain: Low Risk  (10/04/2020)   Overall Financial Resource Strain (CARDIA)    Difficulty of Paying Living Expenses: Not hard at all  Food Insecurity: No Food Insecurity (10/04/2020)   Hunger Vital Sign    Worried About Running Out of Food in the Last Year: Never true    Ran Out of Food in the Last Year: Never true  Transportation Needs: No Transportation Needs (10/04/2020)   PRAPARE - Scientist, research (physical sciences) (Medical): No    Lack of Transportation (Non-Medical): No  Physical Activity: Inactive (10/04/2020)   Exercise Vital Sign    Days of Exercise per Week: 0 days    Minutes of Exercise per Session: 0 min  Stress: Stress Concern Present (10/04/2020)   [employer] of Occupational Health - Occupational Stress Questionnaire    Feeling of Stress : To some extent  Social Connections: Socially Integrated (10/04/2020)   Social Connection and Isolation Panel [NHANES]    Frequency of Communication with Friends and Family: More than three times a week    Frequency of Social Gatherings with Friends and Family: More than three times a week    Attends Religious Services: More than 4 times per year    Active Member of [employer] or Organizations: Yes    Attends Engineer, structural: More than 4 times per year    Marital Status: Married  Catering manager Violence: Not At Risk (10/04/2020)   Humiliation, Afraid, Rape, and Kick questionnaire    Fear of Current or Ex-Partner: No    Emotionally Abused: No    Physically Abused: No    Sexually Abused: No    Family History: Family History  Problem Relation Age of Onset   Diabetes type II Mother    Dementia Father     Current Medications:  Current Outpatient Medications:    acetaminophen  (TYLENOL ) 500 MG tablet, Take 500 mg by mouth every 6 (six) hours as needed for moderate pain or headache., Disp: , Rfl:    aspirin 81 MG EC tablet, Take 1 tablet (81 mg total) by mouth daily. Swallow whole., Disp: 30 tablet, Rfl: 12   Bioflavonoid Products (ESTER C PO), Take 1,000 mg by mouth daily., Disp: , Rfl:    Cholecalciferol (D3-1000) 25 MCG (1000 UT) capsule, Take 125 Units by mouth daily., Disp: , Rfl:    Cranberry-Vitamin C-Vitamin E 4200-20-3 MG-MG-UNIT CAPS, Take 500 mg by mouth daily., Disp: , Rfl:    Garlic 1000 MG CAPS, Take 1,000 mg by mouth daily. , Disp: , Rfl:    lidocaine -prilocaine  (EMLA ) cream, Apply small amount to port a  cath site and cover with plastic wrap 1 hour  prior to infusion appointments, Disp: 30 g, Rfl: 3   Multiple Vitamin (MULTIVITAMIN) tablet, Take 1 tablet by mouth daily., Disp: , Rfl:    OVER THE COUNTER MEDICATION, Take 1,000 mg by mouth daily. Moringa 1000 mg daily, Disp: , Rfl:    zinc gluconate 50 MG tablet, Take 50 mg by mouth daily., Disp: , Rfl:    Allergies: Allergies  Allergen Reactions   Sulfa Antibiotics Other (See Comments)    "Sores in my mouth"    REVIEW OF SYSTEMS:   Review of Systems  Constitutional:  Negative for chills, fatigue and fever.  HENT:   Negative for lump/mass, mouth sores, nosebleeds, sore throat and trouble swallowing.   Eyes:  Negative for eye problems.  Respiratory:  Negative for cough and shortness of breath.   Cardiovascular:  Negative for chest pain, leg swelling and palpitations.  Gastrointestinal:  Positive for diarrhea. Negative for abdominal pain, constipation, nausea and vomiting.  Genitourinary:  Negative for bladder incontinence, difficulty urinating, dysuria, frequency, hematuria and nocturia.   Musculoskeletal:  Negative for arthralgias, back pain, flank pain, myalgias and neck pain.  Skin:  Negative for itching and rash.  Neurological:  Negative for dizziness, headaches and numbness.  Hematological:  Does not bruise/bleed easily.  Psychiatric/Behavioral:  Negative for depression, sleep disturbance and suicidal ideas. The patient is not nervous/anxious.   All other systems reviewed and are negative.    VITALS:   Blood pressure (!) 163/86.  Wt Readings from Last 3 Encounters:  11/29/23 164 lb 3.2 oz (74.5 kg)  10/18/23 164 lb 8 oz (74.6 kg)  09/06/23 165 lb 2 oz (74.9 kg)    There is no height or weight on file to calculate BMI.  Performance status (ECOG): 1 - Symptomatic but completely ambulatory  PHYSICAL EXAM:   Physical Exam Vitals and nursing note reviewed. Exam conducted with a chaperone present.  Constitutional:       Appearance: Normal appearance.  Cardiovascular:     Rate and Rhythm: Normal rate and regular rhythm.     Pulses: Normal pulses.     Heart sounds: Normal heart sounds.  Pulmonary:     Effort: Pulmonary effort is normal.     Breath sounds: Normal breath sounds.  Abdominal:     Palpations: Abdomen is soft. There is no hepatomegaly, splenomegaly or mass.     Tenderness: There is no abdominal tenderness.  Musculoskeletal:     Right lower leg: No edema.     Left lower leg: No edema.  Lymphadenopathy:     Cervical: No cervical adenopathy.     Right cervical: No superficial, deep or posterior cervical adenopathy.    Left cervical: No superficial, deep or posterior cervical adenopathy.     Upper Body:     Right upper body: No supraclavicular or axillary adenopathy.     Left upper body: No supraclavicular or axillary adenopathy.  Neurological:     General: No focal deficit present.     Mental Status: She is alert and oriented to person, place, and time.  Psychiatric:        Mood and Affect: Mood normal.        Behavior: Behavior normal.     LABS:      Latest Ref Rng & Units 11/29/2023    1:05 PM 10/18/2023   10:10 AM 09/06/2023   12:12 PM  CBC  WBC 4.0 - 10.5 K/uL 6.7  7.0  6.5   Hemoglobin 12.0 - 15.0 g/dL 13.0  13.4  12.9   Hematocrit 36.0 - 46.0 % 38.9  41.2  39.1   Platelets 150 - 400 K/uL 186  175  187       Latest Ref Rng & Units 11/29/2023    1:05 PM 10/18/2023   10:10 AM 09/06/2023   12:12 PM  CMP  Glucose 70 - 99 mg/dL 91  94  89   BUN 8 - 23 mg/dL 15  13  17    Creatinine 0.44 - 1.00 mg/dL 1.61  0.96  0.45   Sodium 135 - 145 mmol/L 134  136  135   Potassium 3.5 - 5.1 mmol/L 3.9  3.9  3.9   Chloride 98 - 111 mmol/L 100  102  101   CO2 22 - 32 mmol/L 24  24  26    Calcium  8.9 - 10.3 mg/dL 9.6  9.1  9.3   Total Protein 6.5 - 8.1 g/dL 7.9  7.8  7.6   Total Bilirubin 0.0 - 1.2 mg/dL 0.6  0.7  0.5   Alkaline Phos 38 - 126 U/L 68  63  52   AST 15 - 41 U/L 23  24  25     ALT 0 - 44 U/L 18  18  21       Lab Results  Component Value Date   CEA1 9.0 (H) 07/26/2023   CEA 7.7 (H) 08/26/2020   /  CEA  Date Value Ref Range Status  07/26/2023 9.0 (H) 0.0 - 4.7 ng/mL Final    Comment:    (NOTE)                             Nonsmokers          <3.9                             Smokers             <5.6 Roche Diagnostics Electrochemiluminescence Immunoassay (ECLIA) Values obtained with different assay methods or kits cannot be used interchangeably.  Results cannot be interpreted as absolute evidence of the presence or absence of malignant disease. Performed At: [facility] [address] [place], [place] [id number] Pearlean Botts MD NW:[id number]   08/26/2020 7.7 (H) ng/mL Final    Comment:    Non-Smoker: <2.5 Smoker:     <5.0 . . This test was performed using the Siemens  chemiluminescent method. Values obtained from different assay methods cannot be used interchangeably. CEA levels, regardless of value, should not be interpreted as absolute evidence of the presence or absence of disease. .    No results found for: "PSA1" No results found for: "VHQ469" No results found for: "CAN125"  No results found for: "TOTALPROTELP", "ALBUMINELP", "A1GS", "A2GS", "BETS", "BETA2SER", "GAMS", "MSPIKE", "SPEI" Lab Results  Component Value Date   FERRITIN 119 10/28/2018   FERRITIN 63 02/17/2014   Lab Results  Component Value Date   LDH 170 10/28/2018   LDH 153 04/25/2018     STUDIES:   No results found.

## 2023-11-29 NOTE — Progress Notes (Signed)
Patient presents today for Keytruda infusion per providers order.  Vital signs and labs reviewed by MD.  Message received from Allison Anderson RN/Dr. Katragadda patient okay for treatment.  Treatment given today per MD orders.  Stable during infusion without adverse affects.  Vital signs stable.  No complaints at this time.  Discharge from clinic ambulatory in stable condition.  Alert and oriented X 3.  Follow up with  Cancer Center as scheduled.  

## 2023-11-29 NOTE — Patient Instructions (Signed)
 [facility] [place] - A DEPT OF [facility]  Discharge Instructions: Thank you for choosing [facility] to provide your oncology and hematology care.  If you have a lab appointment with the Cancer Center - please note that after April 8th, 2024, all labs will be drawn in the cancer center.  You do not have to check in or register with the main entrance as you have in the past but will complete your check-in in the cancer center.  Wear comfortable clothing and clothing appropriate for easy access to any Portacath or PICC line.   We strive to give you quality time with your provider. You may need to reschedule your appointment if you arrive late (15 or more minutes).  Arriving late affects you and other patients whose appointments are after yours.  Also, if you miss three or more appointments without notifying the office, you may be dismissed from the clinic at the provider's discretion.      For prescription refill requests, have your pharmacy contact our office and allow 72 hours for refills to be completed.    Today you received the following chemotherapy and/or immunotherapy agents Keytruda      To help prevent nausea and vomiting after your treatment, we encourage you to take your nausea medication as directed.  BELOW ARE SYMPTOMS THAT SHOULD BE REPORTED IMMEDIATELY: *FEVER GREATER THAN 100.4 F (38 C) OR HIGHER *CHILLS OR SWEATING *NAUSEA AND VOMITING THAT IS NOT CONTROLLED WITH YOUR NAUSEA MEDICATION *UNUSUAL SHORTNESS OF BREATH *UNUSUAL BRUISING OR BLEEDING *URINARY PROBLEMS (pain or burning when urinating, or frequent urination) *BOWEL PROBLEMS (unusual diarrhea, constipation, pain near the anus) TENDERNESS IN MOUTH AND THROAT WITH OR WITHOUT PRESENCE OF ULCERS (sore throat, sores in mouth, or a toothache) UNUSUAL RASH, SWELLING OR PAIN  UNUSUAL VAGINAL DISCHARGE OR ITCHING   Items with * indicate a potential emergency and should be followed up  as soon as possible or go to the Emergency Department if any problems should occur.  Please show the CHEMOTHERAPY ALERT CARD or IMMUNOTHERAPY ALERT CARD at check-in to the Emergency Department and triage nurse.  Should you have questions after your visit or need to cancel or reschedule your appointment, please contact [facility]  - A DEPT OF [facility] [phone]  and follow the prompts.  Office hours are 8:00 a.m. to 4:30 p.m. Monday - Friday. Please note that voicemails left after 4:00 p.m. may not be returned until the following business day.  We are closed weekends and major holidays. You have access to a nurse at all times for urgent questions. Please call the main number to the clinic [phone] and follow the prompts.  For any non-urgent questions, you may also contact your provider using MyChart. We now offer e-Visits for anyone 30 and older to request care online for non-urgent symptoms. For details visit [email or web address].   Also download the MyChart app! Go to the app store, search "MyChart", open the app, select [place], and log in with your MyChart username and password.

## 2023-11-29 NOTE — Progress Notes (Signed)
 Patient has been examined by Dr. Ellin Saba. Vital signs and labs have been reviewed by MD - ANC, Creatinine, LFTs, hemoglobin, and platelets are within treatment parameters per M.D. - pt may proceed with treatment.  Primary RN and pharmacy notified.

## 2023-11-30 ENCOUNTER — Other Ambulatory Visit: Payer: Self-pay

## 2023-11-30 LAB — CEA: CEA: 7.5 ng/mL — ABNORMAL HIGH (ref 0.0–4.7)

## 2024-01-10 ENCOUNTER — Inpatient Hospital Stay

## 2024-01-10 ENCOUNTER — Inpatient Hospital Stay: Attending: Hematology

## 2024-01-10 ENCOUNTER — Encounter: Payer: Self-pay | Admitting: Hematology

## 2024-01-10 DIAGNOSIS — C787 Secondary malignant neoplasm of liver and intrahepatic bile duct: Secondary | ICD-10-CM | POA: Insufficient documentation

## 2024-01-10 DIAGNOSIS — Z95828 Presence of other vascular implants and grafts: Secondary | ICD-10-CM

## 2024-01-10 DIAGNOSIS — C184 Malignant neoplasm of transverse colon: Secondary | ICD-10-CM | POA: Diagnosis present

## 2024-01-10 DIAGNOSIS — Z79620 Long term (current) use of immunosuppressive biologic: Secondary | ICD-10-CM | POA: Diagnosis not present

## 2024-01-10 DIAGNOSIS — Z5112 Encounter for antineoplastic immunotherapy: Secondary | ICD-10-CM | POA: Insufficient documentation

## 2024-01-10 DIAGNOSIS — C189 Malignant neoplasm of colon, unspecified: Secondary | ICD-10-CM

## 2024-01-10 LAB — CBC WITH DIFFERENTIAL/PLATELET
Abs Immature Granulocytes: 0.01 10*3/uL (ref 0.00–0.07)
Basophils Absolute: 0 10*3/uL (ref 0.0–0.1)
Basophils Relative: 1 %
Eosinophils Absolute: 0.1 10*3/uL (ref 0.0–0.5)
Eosinophils Relative: 2 %
HCT: 38.3 % (ref 36.0–46.0)
Hemoglobin: 12.7 g/dL (ref 12.0–15.0)
Immature Granulocytes: 0 %
Lymphocytes Relative: 21 %
Lymphs Abs: 1.3 10*3/uL (ref 0.7–4.0)
MCH: 30.8 pg (ref 26.0–34.0)
MCHC: 33.2 g/dL (ref 30.0–36.0)
MCV: 93 fL (ref 80.0–100.0)
Monocytes Absolute: 0.8 10*3/uL (ref 0.1–1.0)
Monocytes Relative: 13 %
Neutro Abs: 4.1 10*3/uL (ref 1.7–7.7)
Neutrophils Relative %: 63 %
Platelets: 170 10*3/uL (ref 150–400)
RBC: 4.12 MIL/uL (ref 3.87–5.11)
RDW: 13.4 % (ref 11.5–15.5)
WBC: 6.4 10*3/uL (ref 4.0–10.5)
nRBC: 0 % (ref 0.0–0.2)

## 2024-01-10 LAB — COMPREHENSIVE METABOLIC PANEL WITH GFR
ALT: 19 U/L (ref 0–44)
AST: 24 U/L (ref 15–41)
Albumin: 3.8 g/dL (ref 3.5–5.0)
Alkaline Phosphatase: 59 U/L (ref 38–126)
Anion gap: 10 (ref 5–15)
BUN: 15 mg/dL (ref 8–23)
CO2: 23 mmol/L (ref 22–32)
Calcium: 9.1 mg/dL (ref 8.9–10.3)
Chloride: 102 mmol/L (ref 98–111)
Creatinine, Ser: 0.64 mg/dL (ref 0.44–1.00)
GFR, Estimated: >60 mL/min (ref >60)
Glucose, Bld: 93 mg/dL (ref 70–99)
Potassium: 3.9 mmol/L (ref 3.5–5.1)
Sodium: 135 mmol/L (ref 135–145)
Total Bilirubin: 0.4 mg/dL (ref 0.0–1.2)
Total Protein: 7.5 g/dL (ref 6.5–8.1)

## 2024-01-10 LAB — MAGNESIUM: Magnesium: 2.2 mg/dL (ref 1.7–2.4)

## 2024-01-10 LAB — TSH: TSH: 1.255 u[IU]/mL (ref 0.350–4.500)

## 2024-01-10 MED ORDER — SODIUM CHLORIDE 0.9% FLUSH
10.0000 mL | Freq: Once | INTRAVENOUS | Status: AC
  Administered 2024-01-10: 10 mL via INTRAVENOUS

## 2024-01-10 MED ORDER — SODIUM CHLORIDE 0.9 % IV SOLN
400.0000 mg | Freq: Once | INTRAVENOUS | Status: AC
  Administered 2024-01-10: 400 mg via INTRAVENOUS
  Filled 2024-01-10: qty 16

## 2024-01-10 MED ORDER — HEPARIN SOD (PORK) LOCK FLUSH 100 UNIT/ML IV SOLN
500.0000 [IU] | Freq: Once | INTRAVENOUS | Status: AC | PRN
Start: 2024-01-10 — End: 2024-01-10
  Administered 2024-01-10: 500 [IU]

## 2024-01-10 MED ORDER — SODIUM CHLORIDE 0.9% FLUSH
10.0000 mL | INTRAVENOUS | Status: AC | PRN
  Administered 2024-01-10: 10 mL

## 2024-01-10 MED ORDER — SODIUM CHLORIDE 0.9 % IV SOLN: Freq: Once | INTRAVENOUS | Status: AC

## 2024-01-10 NOTE — Progress Notes (Unsigned)
 TSH to be drawn today.  Ok to proceed with Keytruda .  Niels Molt, PharmD

## 2024-01-11 LAB — CEA: CEA: 8.3 ng/mL — ABNORMAL HIGH (ref 0.0–4.7)

## 2024-02-07 ENCOUNTER — Ambulatory Visit (HOSPITAL_COMMUNITY)
Admission: RE | Admit: 2024-02-07 | Discharge: 2024-02-07 | Disposition: A | Discharge status: Home / Self Care | Source: Ambulatory Visit | Attending: Hematology | Admitting: Hematology

## 2024-02-07 DIAGNOSIS — C787 Secondary malignant neoplasm of liver and intrahepatic bile duct: Secondary | ICD-10-CM | POA: Insufficient documentation

## 2024-02-07 DIAGNOSIS — C189 Malignant neoplasm of colon, unspecified: Secondary | ICD-10-CM | POA: Insufficient documentation

## 2024-02-07 MED ORDER — IOHEXOL 300 MG/ML  SOLN
100.0000 mL | Freq: Once | INTRAMUSCULAR | Status: AC | PRN
  Administered 2024-02-07: 100 mL via INTRAVENOUS

## 2024-02-07 MED ORDER — IOHEXOL 9 MG/ML PO SOLN
ORAL | Status: AC
  Filled 2024-02-07: qty 2500

## 2024-02-07 MED ORDER — IOHEXOL 9 MG/ML PO SOLN
500.0000 mL | ORAL | Status: AC
  Administered 2024-02-07: 1000 mL via ORAL

## 2024-02-14 ENCOUNTER — Inpatient Hospital Stay

## 2024-02-14 ENCOUNTER — Inpatient Hospital Stay: Attending: Hematology | Admitting: Hematology

## 2024-02-14 VITALS — BP 153/70 | HR 59 | Temp 98.1°F | Resp 18 | Wt 159.8 lb

## 2024-02-14 DIAGNOSIS — C772 Secondary and unspecified malignant neoplasm of intra-abdominal lymph nodes: Secondary | ICD-10-CM | POA: Insufficient documentation

## 2024-02-14 DIAGNOSIS — R197 Diarrhea, unspecified: Secondary | ICD-10-CM | POA: Diagnosis not present

## 2024-02-14 DIAGNOSIS — Z95828 Presence of other vascular implants and grafts: Secondary | ICD-10-CM

## 2024-02-14 DIAGNOSIS — E032 Hypothyroidism due to medicaments and other exogenous substances: Secondary | ICD-10-CM

## 2024-02-14 DIAGNOSIS — Z9049 Acquired absence of other specified parts of digestive tract: Secondary | ICD-10-CM | POA: Insufficient documentation

## 2024-02-14 DIAGNOSIS — C184 Malignant neoplasm of transverse colon: Secondary | ICD-10-CM | POA: Diagnosis not present

## 2024-02-14 DIAGNOSIS — C189 Malignant neoplasm of colon, unspecified: Secondary | ICD-10-CM

## 2024-02-14 DIAGNOSIS — Z79620 Long term (current) use of immunosuppressive biologic: Secondary | ICD-10-CM | POA: Diagnosis not present

## 2024-02-14 DIAGNOSIS — Z5112 Encounter for antineoplastic immunotherapy: Secondary | ICD-10-CM | POA: Insufficient documentation

## 2024-02-14 DIAGNOSIS — C787 Secondary malignant neoplasm of liver and intrahepatic bile duct: Secondary | ICD-10-CM | POA: Insufficient documentation

## 2024-02-14 DIAGNOSIS — C7801 Secondary malignant neoplasm of right lung: Secondary | ICD-10-CM | POA: Insufficient documentation

## 2024-02-14 LAB — CBC WITH DIFFERENTIAL/PLATELET
Abs Immature Granulocytes: 0.02 K/uL (ref 0.00–0.07)
Basophils Absolute: 0 K/uL (ref 0.0–0.1)
Basophils Relative: 1 %
Eosinophils Absolute: 0.1 K/uL (ref 0.0–0.5)
Eosinophils Relative: 2 %
HCT: 39.4 % (ref 36.0–46.0)
Hemoglobin: 12.8 g/dL (ref 12.0–15.0)
Immature Granulocytes: 0 %
Lymphocytes Relative: 22 %
Lymphs Abs: 1.5 K/uL (ref 0.7–4.0)
MCH: 30 pg (ref 26.0–34.0)
MCHC: 32.5 g/dL (ref 30.0–36.0)
MCV: 92.3 fL (ref 80.0–100.0)
Monocytes Absolute: 0.7 K/uL (ref 0.1–1.0)
Monocytes Relative: 11 %
Neutro Abs: 4.2 K/uL (ref 1.7–7.7)
Neutrophils Relative %: 64 %
Platelets: 171 K/uL (ref 150–400)
RBC: 4.27 MIL/uL (ref 3.87–5.11)
RDW: 13.4 % (ref 11.5–15.5)
WBC: 6.6 K/uL (ref 4.0–10.5)
nRBC: 0 % (ref 0.0–0.2)

## 2024-02-14 LAB — COMPREHENSIVE METABOLIC PANEL WITH GFR
ALT: 19 U/L (ref 0–44)
AST: 26 U/L (ref 15–41)
Albumin: 3.9 g/dL (ref 3.5–5.0)
Alkaline Phosphatase: 63 U/L (ref 38–126)
Anion gap: 12 (ref 5–15)
BUN: 16 mg/dL (ref 8–23)
CO2: 24 mmol/L (ref 22–32)
Calcium: 9.3 mg/dL (ref 8.9–10.3)
Chloride: 101 mmol/L (ref 98–111)
Creatinine, Ser: 0.62 mg/dL (ref 0.44–1.00)
GFR, Estimated: >60 mL/min (ref >60)
Glucose, Bld: 90 mg/dL (ref 70–99)
Potassium: 4 mmol/L (ref 3.5–5.1)
Sodium: 137 mmol/L (ref 135–145)
Total Bilirubin: 0.7 mg/dL (ref 0.0–1.2)
Total Protein: 7.7 g/dL (ref 6.5–8.1)

## 2024-02-14 LAB — MAGNESIUM: Magnesium: 2.2 mg/dL (ref 1.7–2.4)

## 2024-02-14 NOTE — Progress Notes (Signed)
 Port flushed with good blood return noted. No bruising or swelling at site. Bandaid applied and patient discharged in satisfactory condition. VVS stable with no signs or symptoms of distressed noted.

## 2024-02-14 NOTE — Progress Notes (Signed)
 [facility] [address], [place] [address]    Clinic Day:  02/14/24   Referring physician: Bertell Satterfield, MD  Patient Care Team: Bertell Satterfield, MD as PCP - General (Internal Medicine) Mavis Anes, MD as Consulting Physician (General Surgery) Rogers Hai, MD as Medical Oncologist (Medical Oncology)   ASSESSMENT & PLAN:   Assessment: 1.  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 - Status post 3 cycles of FOLFOX from 02/10/2014 through 03/10/2014, discontinued secondary to poor tolerance.  DPD was negative. -Last colonoscopy on 03/28/2018 patent ileo-colonic anastomosis.  8 mm polyp in the transverse colon.  Diverticulosis in the sigmoid colon, descending colon. - CT scan on 11/04/2018 shows postoperative findings of right colectomy with redemonstrated mesenteric nodule/lymph node measuring 1.9 x 1.7 cm, unchanged from prior scan in October 2019.  No evidence of metastatic disease.  Stable solid and groundglass pulmonary nodules, stable over multiple prior exams. -CEA was 4.6 on 10/28/2018. -CTAP on 09/21/2020 showed numerous small pulmonary nodules throughout the lung bases, 5 mm.  Enlarged retrocrural periaortic lymph node in the lower chest.  Soft tissue nodule within the central mesentery substantially increased in size, encasing proximal superior mesenteric artery and measuring 5 x 4.3 cm.  Numerous retroperitoneal lymph nodes, largest aortocaval lymph node measuring 2.8 x 2.2 cm. -CT chest on 09/27/2020 shows a left thoracic inlet lymph node approximately 2.3 cm.  Mediastinal lymph nodes and multiple subcentimeter pulmonary nodules suggestive of metastatic disease. -Mesenteric lymph node biopsy on 09/28/2020 shows mucin and fibrosis. -Left supraclavicular lymph node biopsy on 10/14/2020-soft tissue with abundant  mucin, rare fragments of atypical columnar epithelium. - 3 cycles of FOLFIRI and bevacizumab  dose reduced from 11/10/2020 through 12/08/2020. - Caris test-BRAF V600 E+, MMR deficient, MSI high, TMB high, HER2 negative, BRCA1/2 pathogenic variant on exon 14 and exon 9 respectively.  The report also suggested decreased benefit to FOLFOX and bevacizumab  in the first-line metastatic setting. - Cycle 1 of Keytruda  on 01/13/2021, switch to every 6 weeks regimen on 03/22/2023    Plan: 1.  Metastatic colon cancer to the liver, lungs and lymph nodes, BRAF V600E+: - She is tolerating Keytruda  every 6 weeks very well.  No significant skin toxicity. - Labs today: Normal LFTs.  CBC grossly normal.  TSH is 1.255.  CEA has been staying stable at 7-9. - Reviewed CT CAP from 02/07/2024: Continued decrease in size of the left supraclavicular adenopathy.  Further slight decrease in the lung nodules, hepatic mass, central mesenteric mass.  Continued improvement in the retroperitoneal adenopathy. - We have again discussed treatment goals with continuation of pembrolizumab  until progression. - Continue pembrolizumab  every 6 weeks.  RTC 24 weeks with CT CAP with contrast and labs.   2.  Diarrhea: - She has baseline diarrhea since surgery. - Continue Imodium  as needed.  She is averaging 1 Imodium  tablet daily.    Orders Placed This Encounter  Procedures   CT CHEST ABDOMEN PELVIS W CONTRAST    Standing Status:   Future    Expected Date:   07/31/2024    Expiration Date:   10/29/2024    If indicated for the ordered procedure, I authorize the administration of contrast media per Radiology protocol:   Yes    Does the patient have a contrast media/X-ray dye allergy?:   No    Preferred imaging location?:   [facility]    Release to patient:   Immediate    If indicated for the ordered procedure, I authorize the administration of oral contrast media per Radiology protocol:   Yes   CBC with Differential/Platelet     Standing Status:   Future    Expected Date:   07/31/2024    Expiration Date:   10/29/2024    Release to patient:   Immediate   Comprehensive metabolic panel with GFR    Standing Status:   Future    Expected Date:   07/31/2024    Expiration Date:   10/29/2024    Release to patient:   Immediate   Magnesium    Standing Status:   Future    Expected Date:   07/31/2024    Expiration Date:   10/29/2024    Release to patient:   Immediate   TSH    Standing Status:   Future    Expected Date:   07/31/2024    Expiration Date:   10/29/2024    Release to patient:   Immediate      I,Helena R Teague,acting as a scribe for Alean Stands, MD.,have documented all relevant documentation on the behalf of Alean Stands, MD,as directed by  Alean Stands, MD while in the presence of Alean Stands, MD.  I, Alean Stands MD, have reviewed the above documentation for accuracy and completeness, and I agree with the above.      Alean Stands, MD   8/7/20254:23 PM  CHIEF COMPLAINT:   Diagnosis: metastatic transverse colon adenocarcinoma    Cancer Staging  No matching staging information was found for the patient.    Prior Therapy: 1. Right hemicolectomy on 12/29/2013. 2. FOLFOX x 3 cycles from 02/10/2014 to 03/10/2014, stopped secondary to poor tolerance  Current Therapy:  Keytruda  every 6 weeks    HISTORY OF PRESENT ILLNESS:   Oncology History  Adenocarcinoma of transverse colon (HCC) (Resolved)  12/09/2013 Initial Diagnosis   1. Colon, biopsy, proximal transverse mass INVASIVE ADENOCARCINOMA.   12/29/2013 Definitive Surgery   Colon, segmental resection for tumor, right - INVASIVE COLORECTAL ADENOCARCINOMA, 7 CM, EXTENDING INTO PERICOLONIC CONNECTIVE TISSUE. - METASTATIC CARCINOMA IN 6 OF 33 LYMPH NODES (6/33).   02/10/2014 - 03/10/2014 Adjuvant Chemotherapy   FOLFOX x 3 cycles   03/11/2014 Adverse Reaction   Patient called reporting diarrhea despite dose reduction  and she wants to stop therapy.  Patient seen on 03/24/14 to discuss other treatment options and she stands by her decision to stop all therapy.   04/01/2014 Surgery   Port-a-cath removal by Dr. Mavis.   01/12/2015 PET scan   Mild abdominal mesenteric LAD show hypermetabolic activity, 7 mm indeterminate pulm nodule in sup segment of RLL shows no assoc metabolic activity   03/24/2015 PET scan   Mild progression of nodal mets in anterior mid abdominal mesentery, new nodal mets at L thoracic inlet. stable 6 mm nodule in posterior RLL, unchanged since 2015   06/22/2015 PET scan   Resolution of metabolic activity and decreased size of mild LAD at L thoracic inlet, stable mild mid abd hypermetabolic mesenteric LAD, stable 6 mm posterior LLL pulm nodule without metabolic activity   12/22/2015 Imaging   MRI lumbar spine L4-L5 disc degenerated, broad based disc hernation, B facet arthropathy with hypertophy and edema, stenosis of lateral recesses that could cause neural compression   10/18/2016 Imaging   CT CAP- 1. Several small ground-glass pulmonary nodules are present in the lungs and are stable over  the past 9 months, but several are mildly larger than they were in 2015. Low-grade adenocarcinoma can sometimes have this appearance and surveillance is likely warranted. 2. There is a cluster of nodal tissue in the central mesentery, maximum short axis diameter 1.3 cm. This nodal cluster is relatively low in density and not appreciably changed from the prior exam. Given the lack of change is may represent quiescent residua of malignancy, but again, surveillance is likely warranted. 3. The left-sided rib fractures are more numerous than was revealed on the prior radiographs ; there are nondisplaced healing fractures of the left fifth, sixth, seventh, eighth, ninth, and tenth ribs. 4. Mild cardiomegaly although part of this appearance may be due to pectus excavatum. 5. Several additional small  pulmonary nodules are stable from 2015 and considered benign. 6. Right hemicolectomy and sigmoid colon diverticulosis. 7. Small right paraumbilical hernia containing adipose tissue. 8. Lower lumbar spondylosis and degenerative disc disease potentially with mild impingement at L4-5.   04/25/2017 Imaging   CT C/A/P: Scattered solid pulmonary nodules measure up to 6 mm in the right lower lobe, unchanged. There are a few scattered ground-glass nodules measuring up to 8 mm in the right upper lobe, also stable. Distal perigastric lymph nodes are stable. No additional evidence of metastatic disease.   Metastatic colon cancer to liver (HCC)  11/04/2020 Initial Diagnosis   Metastatic colon cancer to liver (HCC)   11/10/2020 - 12/10/2020 Chemotherapy         01/13/2021 - 03/02/2022 Chemotherapy   Patient is on Treatment Plan : COLORECTAL Pembrolizumab  q21d     01/13/2021 -  Chemotherapy   Patient is on Treatment Plan : COLORECTAL Pembrolizumab  (400) q42 days        INTERVAL HISTORY:   Amanda Norris is a 78 y.o. female presenting to clinic today for follow up of metastatic transverse colon adenocarcinoma. She was last seen by me on 11/29/2023.  Since her last visit, she underwent CT CAP on 02/07/2024 that found: Positive treatment response with no areas of new or enlarging metastatic disease are identified. Mild decrease in the left supraclavicular adenopathy. Further slight decrease in the largest pulmonary nodule, in the right lower lobe. Further decrease in the size of the hepatic mass. Further decrease in the size of the central mesenteric mass. Further decrease in the size of the retroperitoneal adenopathy.  Today, she states that she is doing well overall. Her appetite level is at 100%. Her energy level is at 50%. Michelina is accompanied by her husband. She is tolerating Keytruda  well and denies any side effects including skin rashes. Her diarrhea is at baseline, requiring imodium  once daily.   PAST MEDICAL  HISTORY:   Past Medical History: Past Medical History:  Diagnosis Date   Arthritis    wrist and thumbs   Colon cancer (HCC)    PONV (postoperative nausea and vomiting)    Port-A-Cath in place 11/09/2020   Ruptured lumbar disc    L1-L5 per patient report   Sciatica of left side     Surgical History: Past Surgical History:  Procedure Laterality Date   COLONOSCOPY N/A 12/12/2013   Procedure: COLONOSCOPY;  Surgeon: Claudis RAYMOND Rivet, MD;  Location: [facility];  Service: Endoscopy;  Laterality: N/A;  730   COLONOSCOPY N/A 01/20/2015   Procedure: COLONOSCOPY;  Surgeon: Claudis RAYMOND Rivet, MD;  Location: [facility];  Service: Endoscopy;  Laterality: N/A;  1030   COLONOSCOPY N/A 03/28/2018   Procedure: COLONOSCOPY;  Surgeon: Rivet Claudis RAYMOND,  MD;  Location: [facility];  Service: Endoscopy;  Laterality: N/A;  1015   EUS N/A 12/18/2013   Procedure: UPPER ENDOSCOPIC ULTRASOUND (EUS) LINEAR;  Surgeon: Toribio SHAUNNA Cedar, MD;  Location: [facility];  Service: Endoscopy;  Laterality: N/A;   herniated disc     [id number]   IR IMAGING GUIDED PORT INSERTION  10/14/2020   IR US  GUIDANCE  10/14/2020   PARTIAL COLECTOMY N/A 12/29/2013   Procedure: RIGHT HEMICOLECTOMY;  Surgeon: Oneil DELENA Budge, MD;  Location: [facility];  Service: General;  Laterality: N/A;   POLYPECTOMY  03/28/2018   Procedure: POLYPECTOMY;  Surgeon: Golda Claudis PENNER, MD;  Location: [facility];  Service: Endoscopy;;  transverse colon (hot snare);   PORT-A-CATH REMOVAL Right 04/01/2014   Procedure: MINOR REMOVAL PORT-A-CATH;  Surgeon: Oneil DELENA Budge, MD;  Location: [facility];  Service: General;  Laterality: Right;   PORTACATH PLACEMENT Right 02/04/2014   Procedure: INSERTION PORT-A-CATH;  Surgeon: Oneil DELENA Budge, MD;  Location: [facility];  Service: General;  Laterality: Right;    Social History: Social History   Socioeconomic History   Marital status: Married    Spouse name: Not on file   Number of children: Not on file   Years of education:  Not on file   Highest education level: Not on file  Occupational History   Not on file  Tobacco Use   Smoking status: Former    Current packs/day: 0.00    Average packs/day: 0.3 packs/day for 5.0 years (1.3 ttl pk-yrs)    Types: Cigarettes    Start date: 04/18/1954    Quit date: 04/19/1959    Years since quitting: 64.8   Smokeless tobacco: Never   Tobacco comments:    smoked for 5 years as teenager  Vaping Use   Vaping status: Never Used  Substance and Sexual Activity   Alcohol use: No   Drug use: No   Sexual activity: Yes    Birth control/protection: Post-menopausal  Other Topics Concern   Not on file  Social History Narrative   Not on file   Social Drivers of Health   Financial Resource Strain: Low Risk  (10/04/2020)   Overall Financial Resource Strain (CARDIA)    Difficulty of Paying Living Expenses: Not hard at all  Food Insecurity: No Food Insecurity (10/04/2020)   Hunger Vital Sign    Worried About Running Out of Food in the Last Year: Never true    Ran Out of Food in the Last Year: Never true  Transportation Needs: No Transportation Needs (10/04/2020)   PRAPARE - Administrator, Civil Service (Medical): No    Lack of Transportation (Non-Medical): No  Physical Activity: Inactive (10/04/2020)   Exercise Vital Sign    Days of Exercise per Week: 0 days    Minutes of Exercise per Session: 0 min  Stress: Stress Concern Present (10/04/2020)   [employer] of Occupational Health - Occupational Stress Questionnaire    Feeling of Stress : To some extent  Social Connections: Socially Integrated (10/04/2020)   Social Connection and Isolation Panel    Frequency of Communication with Friends and Family: More than three times a week    Frequency of Social Gatherings with Friends and Family: More than three times a week    Attends Religious Services: More than 4 times per year    Active Member of [employer] or Organizations: Yes    Attends Banker  Meetings: More than 4 times per year  Marital Status: Married  Catering manager Violence: Not At Risk (10/04/2020)   Humiliation, Afraid, Rape, and Kick questionnaire    Fear of Current or Ex-Partner: No    Emotionally Abused: No    Physically Abused: No    Sexually Abused: No    Family History: Family History  Problem Relation Age of Onset   Diabetes type II Mother    Dementia Father     Current Medications:  Current Outpatient Medications:    acetaminophen  (TYLENOL ) 500 MG tablet, Take 500 mg by mouth every 6 (six) hours as needed for moderate pain or headache., Disp: , Rfl:    aspirin 81 MG EC tablet, Take 1 tablet (81 mg total) by mouth daily. Swallow whole., Disp: 30 tablet, Rfl: 12   Bioflavonoid Products (ESTER C PO), Take 1,000 mg by mouth daily., Disp: , Rfl:    Cholecalciferol (D3-1000) 25 MCG (1000 UT) capsule, Take 125 Units by mouth daily., Disp: , Rfl:    Cranberry-Vitamin C-Vitamin E 4200-20-3 MG-MG-UNIT CAPS, Take 500 mg by mouth daily., Disp: , Rfl:    Garlic 1000 MG CAPS, Take 1,000 mg by mouth daily. , Disp: , Rfl:    lidocaine -prilocaine  (EMLA ) cream, Apply small amount to port a cath site and cover with plastic wrap 1 hour prior to infusion appointments, Disp: 30 g, Rfl: 3   Multiple Vitamin (MULTIVITAMIN) tablet, Take 1 tablet by mouth daily., Disp: , Rfl:    OVER THE COUNTER MEDICATION, Take 1,000 mg by mouth daily. Moringa 1000 mg daily, Disp: , Rfl:    zinc gluconate 50 MG tablet, Take 50 mg by mouth daily., Disp: , Rfl:  No current facility-administered medications for this visit.  Facility-Administered Medications Ordered in Other Visits:    sodium chloride  flush (NS) 0.9 % injection 10 mL, 10 mL, Intracatheter, PRN, Ashyra Cantin, MD, 10 mL at 01/10/24 1434   Allergies: Allergies  Allergen Reactions   Sulfa Antibiotics Other (See Comments)    Sores in my mouth    REVIEW OF SYSTEMS:   Review of Systems  Constitutional:  Negative for  chills, fatigue and fever.  HENT:   Negative for lump/mass, mouth sores, nosebleeds, sore throat and trouble swallowing.   Eyes:  Negative for eye problems.  Respiratory:  Positive for shortness of breath. Negative for cough.   Cardiovascular:  Negative for chest pain, leg swelling and palpitations.  Gastrointestinal:  Positive for diarrhea. Negative for abdominal pain, constipation, nausea and vomiting.  Genitourinary:  Negative for bladder incontinence, difficulty urinating, dysuria, frequency, hematuria and nocturia.   Musculoskeletal:  Negative for arthralgias, back pain, flank pain, myalgias and neck pain.  Skin:  Negative for itching and rash.  Neurological:  Negative for dizziness, headaches and numbness.  Hematological:  Does not bruise/bleed easily.  Psychiatric/Behavioral:  Negative for depression, sleep disturbance and suicidal ideas. The patient is not nervous/anxious.   All other systems reviewed and are negative.    VITALS:   Blood pressure (!) 153/70, pulse (!) 59, temperature 98.1 F (36.7 C), temperature source Oral, resp. rate 18, weight 159 lb 13.3 oz (72.5 kg), SpO2 98%.  Wt Readings from Last 3 Encounters:  02/14/24 159 lb 13.3 oz (72.5 kg)  02/14/24 160 lb 9.6 oz (72.8 kg)  01/10/24 161 lb 9.6 oz (73.3 kg)    Body mass index is 28.31 kg/m.  Performance status (ECOG): 1 - Symptomatic but completely ambulatory  PHYSICAL EXAM:   Physical Exam Vitals and nursing note reviewed. Exam conducted with  a chaperone present.  Constitutional:      Appearance: Normal appearance.  Cardiovascular:     Rate and Rhythm: Normal rate and regular rhythm.     Pulses: Normal pulses.     Heart sounds: Normal heart sounds.  Pulmonary:     Effort: Pulmonary effort is normal.     Breath sounds: Normal breath sounds.  Abdominal:     Palpations: Abdomen is soft. There is no hepatomegaly, splenomegaly or mass.     Tenderness: There is no abdominal tenderness.  Musculoskeletal:      Right lower leg: No edema.     Left lower leg: No edema.  Lymphadenopathy:     Cervical: No cervical adenopathy.     Right cervical: No superficial, deep or posterior cervical adenopathy.    Left cervical: No superficial, deep or posterior cervical adenopathy.     Upper Body:     Right upper body: No supraclavicular or axillary adenopathy.     Left upper body: No supraclavicular or axillary adenopathy.  Neurological:     General: No focal deficit present.     Mental Status: She is alert and oriented to person, place, and time.  Psychiatric:        Mood and Affect: Mood normal.        Behavior: Behavior normal.     LABS:      Latest Ref Rng & Units 02/14/2024    1:32 PM 01/10/2024   11:38 AM 11/29/2023    1:05 PM  CBC  WBC 4.0 - 10.5 K/uL 6.6  6.4  6.7   Hemoglobin 12.0 - 15.0 g/dL 87.1  87.2  86.9   Hematocrit 36.0 - 46.0 % 39.4  38.3  38.9   Platelets 150 - 400 K/uL 171  170  186       Latest Ref Rng & Units 02/14/2024    1:32 PM 01/10/2024   11:38 AM 11/29/2023    1:05 PM  CMP  Glucose 70 - 99 mg/dL 90  93  91   BUN 8 - 23 mg/dL 16  15  15    Creatinine 0.44 - 1.00 mg/dL 9.37  9.35  9.39   Sodium 135 - 145 mmol/L 137  135  134   Potassium 3.5 - 5.1 mmol/L 4.0  3.9  3.9   Chloride 98 - 111 mmol/L 101  102  100   CO2 22 - 32 mmol/L 24  23  24    Calcium  8.9 - 10.3 mg/dL 9.3  9.1  9.6   Total Protein 6.5 - 8.1 g/dL 7.7  7.5  7.9   Total Bilirubin 0.0 - 1.2 mg/dL 0.7  0.4  0.6   Alkaline Phos 38 - 126 U/L 63  59  68   AST 15 - 41 U/L 26  24  23    ALT 0 - 44 U/L 19  19  18       Lab Results  Component Value Date   CEA1 8.3 (H) 01/10/2024   CEA 7.7 (H) 08/26/2020   /  CEA  Date Value Ref Range Status  01/10/2024 8.3 (H) 0.0 - 4.7 ng/mL Final    Comment:    (NOTE)                             Nonsmokers          <3.9  Smokers             <5.6 Roche [employer]ostics Electrochemiluminescence Immunoassay (ECLIA) Values obtained with different assay  methods or kits cannot be used interchangeably.  Results cannot be interpreted as absolute evidence of the presence or absence of malignant disease. Performed At: [facility] [address] [place], [place] [id number] Jennette Shorter MD Ey:[id number]   08/26/2020 7.7 (H) ng/mL Final    Comment:    Non-Smoker: <2.5 Smoker:     <5.0 . . This test was performed using the Siemens  chemiluminescent method. Values obtained from different assay methods cannot be used interchangeably. CEA levels, regardless of value, should not be interpreted as absolute evidence of the presence or absence of disease. .    No results found for: PSA1 No results found for: CAN199 No results found for: CAN125  No results found for: TOTALPROTELP, ALBUMINELP, A1GS, A2GS, BETS, BETA2SER, GAMS, MSPIKE, SPEI Lab Results  Component Value Date   FERRITIN 119 10/28/2018   FERRITIN 63 02/17/2014   Lab Results  Component Value Date   LDH 170 10/28/2018   LDH 153 04/25/2018     STUDIES:   CT CHEST ABDOMEN PELVIS W CONTRAST Result Date: 02/13/2024 EXAM:  CT CHEST ABDOMEN PELVIS WITH IV CONTRAST INDICATION:  Metastatic disease evaluation TECHNIQUE: Spiral CT scanning was performed through the chest, abdomen and pelvis after the patient received oral and IV contrast. COMPARISON: 08/30/2023 FINDINGS: The cardiac size is within normal limits. There is no thoracic aortic aneurysm. No filling defects are identified in the central pulmonary arteries. The esophagus and thyroid  glands have a normal appearance. A 1.5 x 1.4 cm right paratracheal lymph node is present which has not changed significantly. The small retrocrural nodes in the lower chest are unchanged. The left supraclavicular lymph nodes have a combined dimension of 2.0 x 1.2 cm (previously 2.5 x 1.4 cm). No pleural or pericardial effusion is present. Scattered subcentimeter soft tissue and groundglass attenuation nodules are  present in both lungs. The number of nodules does not appear to have changed. The largest nodule on the right is visualized on image 89/4. This measures 6 mm (previously 7 mm). The largest nodule on the left is visualized in the posterior lingula on image 93/4. It measures 5 mm which is unchanged. There is redemonstration of a small hypodense hepatic mass in segment 4. This measures 0.9 x 0.5 cm (previously 1.6 x 0.7 cm). No developing hepatic masses are identified. The partially calcified central mesenteric mass measures 4.1 x 2.9 cm (previously 4.4 x 3.1 cm). There is no significant abnormality identified in the spleen, pancreas and gallbladder. No calculus, obstruction, or soft tissue mass is present involving the kidneys. There are no adrenal masses. There are stable postsurgical changes from partial colectomy and ileocolic anastomosis. No local tumor recurrence is identified. No ascites is present. There has been a decrease in retroperitoneal adenopathy. The largest node is visualized on image 70/2, measuring 6 mm (previously 9 mm). No abdominal aortic aneurysm is present. The bladder and uterus have a normal appearance. There is no adnexal mass. Bilateral adnexal varicosities are present, unchanged. There is no inguinal hernia. Mild sigmoid diverticulosis is present with no associated inflammation. There is no fracture or bone destruction. IMPRESSION: 1. Positive treatment response with no areas of new or enlarging metastatic disease are identified. 2. Mild decrease in the left supraclavicular adenopathy. 3. Further slight decrease in the largest pulmonary nodule, in the right lower lobe. 4. Further decrease in the  size of the hepatic mass. 5. Further decrease in the size of the central mesenteric mass. 6. Further decrease in the size of the retroperitoneal adenopathy. Please note that CT scanning at this site utilizes multiple dose reduction techniques, including automatic exposure control, adjustment of the  MAA and/or KVP according to the patient's size, and use of iterative reconstruction. Electronically signed by: Eddy Oar MD 02/13/2024 10:22 AM EDT RP Workstation: [id number]

## 2024-02-14 NOTE — Patient Instructions (Signed)
 [facility] [place] - A DEPT OF [place]  Discharge Instructions: Thank you for choosing [facility] to provide your oncology and hematology care.  If you have a lab appointment with the Cancer Center - please note that after April 8th, 2024, all labs will be drawn in the cancer center.  You do not have to check in or register with the main entrance as you have in the past but will complete your check-in in the cancer center.  Wear comfortable clothing and clothing appropriate for easy access to any Portacath or PICC line.   We strive to give you quality time with your provider. You may need to reschedule your appointment if you arrive late (15 or more minutes).  Arriving late affects you and other patients whose appointments are after yours.  Also, if you miss three or more appointments without notifying the office, you may be dismissed from the clinic at the provider's discretion.      For prescription refill requests, have your pharmacy contact our office and allow 72 hours for refills to be completed.    Today you received the following port flushed with labs, return as scheduled.   To help prevent nausea and vomiting after your treatment, we encourage you to take your nausea medication as directed.  BELOW ARE SYMPTOMS THAT SHOULD BE REPORTED IMMEDIATELY: *FEVER GREATER THAN 100.4 F (38 C) OR HIGHER *CHILLS OR SWEATING *NAUSEA AND VOMITING THAT IS NOT CONTROLLED WITH YOUR NAUSEA MEDICATION *UNUSUAL SHORTNESS OF BREATH *UNUSUAL BRUISING OR BLEEDING *URINARY PROBLEMS (pain or burning when urinating, or frequent urination) *BOWEL PROBLEMS (unusual diarrhea, constipation, pain near the anus) TENDERNESS IN MOUTH AND THROAT WITH OR WITHOUT PRESENCE OF ULCERS (sore throat, sores in mouth, or a toothache) UNUSUAL RASH, SWELLING OR PAIN  UNUSUAL VAGINAL DISCHARGE OR ITCHING   Items with * indicate a potential emergency and should be followed up as soon  as possible or go to the Emergency Department if any problems should occur.  Please show the CHEMOTHERAPY ALERT CARD or IMMUNOTHERAPY ALERT CARD at check-in to the Emergency Department and triage nurse.  Should you have questions after your visit or need to cancel or reschedule your appointment, please contact [facility] [place] - A DEPT OF [facility] [phone]  and follow the prompts.  Office hours are 8:00 a.m. to 4:30 p.m. Monday - Friday. Please note that voicemails left after 4:00 p.m. may not be returned until the following business day.  We are closed weekends and major holidays. You have access to a nurse at all times for urgent questions. Please call the main number to the clinic [phone] and follow the prompts.  For any non-urgent questions, you may also contact your provider using MyChart. We now offer e-Visits for anyone 19 and older to request care online for non-urgent symptoms. For details visit [email or web address].   Also download the MyChart app! Go to the app store, search MyChart, open the app, select [place], and log in with your MyChart username and password.

## 2024-02-14 NOTE — Patient Instructions (Signed)
 [facility]  Discharge Instructions  You were seen and examined today by Dr. Rogers.  Dr. Rogers discussed your most recent lab work and CT scan which revealed that everything looks good and stable.  Dr. Rogers is ordering repeat CT scan and labs in 6 months.  Follow-up as scheduled.    Thank you for choosing [facility] to provide your oncology and hematology care.   To afford each patient quality time with our provider, please arrive at least 15 minutes before your scheduled appointment time. You may need to reschedule your appointment if you arrive late (10 or more minutes). Arriving late affects you and other patients whose appointments are after yours.  Also, if you miss three or more appointments without notifying the office, you may be dismissed from the clinic at the provider's discretion.    Again, thank you for choosing [facility].  Our hope is that these requests will decrease the amount of time that you wait before being seen by our physicians.   If you have a lab appointment with the Cancer Center - please note that after April 8th, all labs will be drawn in the cancer center.  You do not have to check in or register with the main entrance as you have in the past but will complete your check-in at the cancer center.            _____________________________________________________________  Should you have questions after your visit to [facility], please contact our office at [phone] and follow the prompts.  Our office hours are 8:00 a.m. to 4:30 p.m. Monday - Thursday and 8:00 a.m. to 2:30 p.m. Friday.  Please note that voicemails left after 4:00 p.m. may not be returned until the following business day.  We are closed weekends and all major holidays.  You do have access to a nurse 24-7, just call the main number to the clinic [phone] and do not press any options, hold on the  line and a nurse will answer the phone.    For prescription refill requests, have your pharmacy contact our office and allow 72 hours.    Masks are no longer required in the cancer centers. If you would like for your care team to wear a mask while they are taking care of you, please let them know. You may have one support person who is at least 78 years old accompany you for your appointments.

## 2024-02-15 ENCOUNTER — Other Ambulatory Visit: Payer: Self-pay

## 2024-02-15 LAB — CEA: CEA: 8.7 ng/mL — ABNORMAL HIGH (ref 0.0–4.7)

## 2024-02-21 ENCOUNTER — Inpatient Hospital Stay

## 2024-02-21 ENCOUNTER — Encounter: Payer: Self-pay | Admitting: Oncology

## 2024-02-21 VITALS — BP 127/74 | HR 54 | Temp 97.8°F | Resp 16 | Wt 160.6 lb

## 2024-02-21 DIAGNOSIS — C189 Malignant neoplasm of colon, unspecified: Secondary | ICD-10-CM

## 2024-02-21 DIAGNOSIS — C7801 Secondary malignant neoplasm of right lung: Secondary | ICD-10-CM | POA: Diagnosis not present

## 2024-02-21 DIAGNOSIS — C184 Malignant neoplasm of transverse colon: Secondary | ICD-10-CM | POA: Diagnosis not present

## 2024-02-21 DIAGNOSIS — R197 Diarrhea, unspecified: Secondary | ICD-10-CM | POA: Diagnosis not present

## 2024-02-21 DIAGNOSIS — Z5112 Encounter for antineoplastic immunotherapy: Secondary | ICD-10-CM | POA: Diagnosis not present

## 2024-02-21 DIAGNOSIS — Z95828 Presence of other vascular implants and grafts: Secondary | ICD-10-CM

## 2024-02-21 DIAGNOSIS — C772 Secondary and unspecified malignant neoplasm of intra-abdominal lymph nodes: Secondary | ICD-10-CM | POA: Diagnosis not present

## 2024-02-21 DIAGNOSIS — C787 Secondary malignant neoplasm of liver and intrahepatic bile duct: Secondary | ICD-10-CM | POA: Diagnosis not present

## 2024-02-21 MED ORDER — SODIUM CHLORIDE 0.9 % IV SOLN
400.0000 mg | Freq: Once | INTRAVENOUS | Status: AC
  Administered 2024-02-21: 400 mg via INTRAVENOUS
  Filled 2024-02-21: qty 16

## 2024-02-21 MED ORDER — SODIUM CHLORIDE 0.9 % IV SOLN
Freq: Once | INTRAVENOUS | Status: AC
Start: 2024-02-21 — End: 2024-02-21

## 2024-02-21 NOTE — Patient Instructions (Signed)
 [facility] [place] - A DEPT OF [place]  Discharge Instructions: Thank you for choosing [facility] to provide your oncology and hematology care.  If you have a lab appointment with the Cancer Center - please note that after April 8th, 2024, all labs will be drawn in the cancer center.  You do not have to check in or register with the main entrance as you have in the past but will complete your check-in in the cancer center.  Wear comfortable clothing and clothing appropriate for easy access to any Portacath or PICC line.   We strive to give you quality time with your provider. You may need to reschedule your appointment if you arrive late (15 or more minutes).  Arriving late affects you and other patients whose appointments are after yours.  Also, if you miss three or more appointments without notifying the office, you may be dismissed from the clinic at the provider's discretion.      For prescription refill requests, have your pharmacy contact our office and allow 72 hours for refills to be completed.    Today you received the following chemotherapy and/or immunotherapy agents keytruda     To help prevent nausea and vomiting after your treatment, we encourage you to take your nausea medication as directed.  BELOW ARE SYMPTOMS THAT SHOULD BE REPORTED IMMEDIATELY: *FEVER GREATER THAN 100.4 F (38 C) OR HIGHER *CHILLS OR SWEATING *NAUSEA AND VOMITING THAT IS NOT CONTROLLED WITH YOUR NAUSEA MEDICATION *UNUSUAL SHORTNESS OF BREATH *UNUSUAL BRUISING OR BLEEDING *URINARY PROBLEMS (pain or burning when urinating, or frequent urination) *BOWEL PROBLEMS (unusual diarrhea, constipation, pain near the anus) TENDERNESS IN MOUTH AND THROAT WITH OR WITHOUT PRESENCE OF ULCERS (sore throat, sores in mouth, or a toothache) UNUSUAL RASH, SWELLING OR PAIN  UNUSUAL VAGINAL DISCHARGE OR ITCHING   Items with * indicate a potential emergency and should be followed up as  soon as possible or go to the Emergency Department if any problems should occur.  Please show the CHEMOTHERAPY ALERT CARD or IMMUNOTHERAPY ALERT CARD at check-in to the Emergency Department and triage nurse.  Should you have questions after your visit or need to cancel or reschedule your appointment, please contact [facility] [place] - A DEPT OF [facility] [phone]  and follow the prompts.  Office hours are 8:00 a.m. to 4:30 p.m. Monday - Friday. Please note that voicemails left after 4:00 p.m. may not be returned until the following business day.  We are closed weekends and major holidays. You have access to a nurse at all times for urgent questions. Please call the main number to the clinic [phone] and follow the prompts.  For any non-urgent questions, you may also contact your provider using MyChart. We now offer e-Visits for anyone 47 and older to request care online for non-urgent symptoms. For details visit [email or web address].   Also download the MyChart app! Go to the app store, search MyChart, open the app, select  [place], and log in with your MyChart username and password.

## 2024-02-21 NOTE — Progress Notes (Signed)

## 2024-02-22 ENCOUNTER — Other Ambulatory Visit: Payer: Self-pay

## 2024-03-04 ENCOUNTER — Other Ambulatory Visit: Payer: Self-pay

## 2024-03-25 DIAGNOSIS — H26493 Other secondary cataract, bilateral: Secondary | ICD-10-CM | POA: Diagnosis not present

## 2024-03-25 DIAGNOSIS — H35361 Drusen (degenerative) of macula, right eye: Secondary | ICD-10-CM | POA: Diagnosis not present

## 2024-03-25 DIAGNOSIS — H04123 Dry eye syndrome of bilateral lacrimal glands: Secondary | ICD-10-CM | POA: Diagnosis not present

## 2024-03-27 ENCOUNTER — Ambulatory Visit: Admitting: Oncology

## 2024-03-27 ENCOUNTER — Ambulatory Visit

## 2024-03-27 ENCOUNTER — Other Ambulatory Visit: Payer: Self-pay

## 2024-03-27 ENCOUNTER — Inpatient Hospital Stay

## 2024-04-03 ENCOUNTER — Encounter: Payer: Self-pay | Admitting: Oncology

## 2024-04-03 ENCOUNTER — Inpatient Hospital Stay: Attending: Hematology

## 2024-04-03 ENCOUNTER — Inpatient Hospital Stay (HOSPITAL_BASED_OUTPATIENT_CLINIC_OR_DEPARTMENT_OTHER): Admitting: Oncology

## 2024-04-03 ENCOUNTER — Inpatient Hospital Stay

## 2024-04-03 VITALS — BP 138/54 | HR 61 | Temp 98.2°F | Resp 18

## 2024-04-03 VITALS — BP 179/78 | HR 92 | Temp 97.1°F | Resp 18

## 2024-04-03 VITALS — BP 160/67 | Wt 159.8 lb

## 2024-04-03 DIAGNOSIS — C7800 Secondary malignant neoplasm of unspecified lung: Secondary | ICD-10-CM | POA: Insufficient documentation

## 2024-04-03 DIAGNOSIS — Z5112 Encounter for antineoplastic immunotherapy: Secondary | ICD-10-CM | POA: Insufficient documentation

## 2024-04-03 DIAGNOSIS — C787 Secondary malignant neoplasm of liver and intrahepatic bile duct: Secondary | ICD-10-CM | POA: Diagnosis not present

## 2024-04-03 DIAGNOSIS — Z79620 Long term (current) use of immunosuppressive biologic: Secondary | ICD-10-CM | POA: Diagnosis not present

## 2024-04-03 DIAGNOSIS — Z95828 Presence of other vascular implants and grafts: Secondary | ICD-10-CM

## 2024-04-03 DIAGNOSIS — C189 Malignant neoplasm of colon, unspecified: Secondary | ICD-10-CM | POA: Diagnosis not present

## 2024-04-03 DIAGNOSIS — K529 Noninfective gastroenteritis and colitis, unspecified: Secondary | ICD-10-CM

## 2024-04-03 DIAGNOSIS — C184 Malignant neoplasm of transverse colon: Secondary | ICD-10-CM | POA: Insufficient documentation

## 2024-04-03 LAB — COMPREHENSIVE METABOLIC PANEL WITH GFR
ALT: 21 U/L (ref 0–44)
AST: 28 U/L (ref 15–41)
Albumin: 3.9 g/dL (ref 3.5–5.0)
Alkaline Phosphatase: 60 U/L (ref 38–126)
Anion gap: 12 (ref 5–15)
BUN: 15 mg/dL (ref 8–23)
CO2: 23 mmol/L (ref 22–32)
Calcium: 9 mg/dL (ref 8.9–10.3)
Chloride: 103 mmol/L (ref 98–111)
Creatinine, Ser: 0.7 mg/dL (ref 0.44–1.00)
GFR, Estimated: >60 mL/min (ref >60)
Glucose, Bld: 95 mg/dL (ref 70–99)
Potassium: 3.8 mmol/L (ref 3.5–5.1)
Sodium: 138 mmol/L (ref 135–145)
Total Bilirubin: 0.4 mg/dL (ref 0.0–1.2)
Total Protein: 7.6 g/dL (ref 6.5–8.1)

## 2024-04-03 LAB — CBC WITH DIFFERENTIAL/PLATELET
Abs Immature Granulocytes: 0.03 K/uL (ref 0.00–0.07)
Basophils Absolute: 0 K/uL (ref 0.0–0.1)
Basophils Relative: 0 %
Eosinophils Absolute: 0.1 K/uL (ref 0.0–0.5)
Eosinophils Relative: 2 %
HCT: 39.7 % (ref 36.0–46.0)
Hemoglobin: 12.9 g/dL (ref 12.0–15.0)
Immature Granulocytes: 0 %
Lymphocytes Relative: 15 %
Lymphs Abs: 1.1 K/uL (ref 0.7–4.0)
MCH: 29.8 pg (ref 26.0–34.0)
MCHC: 32.5 g/dL (ref 30.0–36.0)
MCV: 91.7 fL (ref 80.0–100.0)
Monocytes Absolute: 0.7 K/uL (ref 0.1–1.0)
Monocytes Relative: 10 %
Neutro Abs: 5.2 K/uL (ref 1.7–7.7)
Neutrophils Relative %: 73 %
Platelets: 170 K/uL (ref 150–400)
RBC: 4.33 MIL/uL (ref 3.87–5.11)
RDW: 13.2 % (ref 11.5–15.5)
WBC: 7.2 K/uL (ref 4.0–10.5)
nRBC: 0 % (ref 0.0–0.2)

## 2024-04-03 LAB — TSH: TSH: 1.769 u[IU]/mL (ref 0.350–4.500)

## 2024-04-03 LAB — MAGNESIUM: Magnesium: 2.1 mg/dL (ref 1.7–2.4)

## 2024-04-03 MED ORDER — SODIUM CHLORIDE 0.9 % IV SOLN: Freq: Once | INTRAVENOUS | Status: AC

## 2024-04-03 MED ORDER — SODIUM CHLORIDE 0.9 % IV SOLN
400.0000 mg | Freq: Once | INTRAVENOUS | Status: AC
  Administered 2024-04-03: 400 mg via INTRAVENOUS
  Filled 2024-04-03: qty 16

## 2024-04-03 NOTE — Progress Notes (Signed)
 Patient has been examined by Dr. Davonna. Vital signs and labs have been reviewed by MD - ANC, Creatinine, LFTs, hemoglobin, and platelets have been reviewed by M.D. - pt may proceed with treatment.  Primary RN and pharmacy notified.

## 2024-04-03 NOTE — Patient Instructions (Signed)

## 2024-04-03 NOTE — Progress Notes (Signed)
 Patient presents today for immunotherapy infusion of Keytruda . Patient is in satisfactory condition with no new complaints voiced.  Vital signs are stable.  Labs reviewed by Dr. Davonna during the office visit and all labs are within treatment parameters.  We will proceed with treatment per MD orders.   Patient tolerated treatment well with no complaints voiced.  Patient left ambulatory in stable condition.  Vital signs stable at discharge.  Follow up as scheduled.

## 2024-04-03 NOTE — Patient Instructions (Signed)
 [facility] [place] - A DEPT OF [facility]  Discharge Instructions: Thank you for choosing  Cancer Center to provide your oncology and hematology care.  If you have a lab appointment with the Cancer Center - please note that after April 8th, 2024, all labs will be drawn in the cancer center.  You do not have to check in or register with the main entrance as you have in the past but will complete your check-in in the cancer center.  Wear comfortable clothing and clothing appropriate for easy access to any Portacath or PICC line.   We strive to give you quality time with your provider. You may need to reschedule your appointment if you arrive late (15 or more minutes).  Arriving late affects you and other patients whose appointments are after yours.  Also, if you miss three or more appointments without notifying the office, you may be dismissed from the clinic at the provider's discretion.      For prescription refill requests, have your pharmacy contact our office and allow 72 hours for refills to be completed.    Today you received the following chemotherapy and/or immunotherapy agents Keytruda. Pembrolizumab Injection What is this medication? PEMBROLIZUMAB (PEM broe LIZ ue mab) treats some types of cancer. It works by helping your immune system slow or stop the spread of cancer cells. It is a monoclonal antibody. This medicine may be used for other purposes; ask your health care provider or pharmacist if you have questions. COMMON BRAND NAME(S): Keytruda What should I tell my care team before I take this medication? They need to know if you have any of these conditions: Allogeneic stem cell transplant (uses someone else's stem cells) Autoimmune diseases, such as Crohn disease, ulcerative colitis, lupus History of chest radiation Nervous system problems, such as Guillain-Barre syndrome, myasthenia gravis Organ transplant An unusual or allergic reaction to  pembrolizumab, other medications, foods, dyes, or preservatives Pregnant or trying to get pregnant Breast-feeding How should I use this medication? This medication is injected into a vein. It is given by your care team in a hospital or clinic setting. A special MedGuide will be given to you before each treatment. Be sure to read this information carefully each time. Talk to your care team about the use of this medication in children. While it may be prescribed for children as young as 6 months for selected conditions, precautions do apply. Overdosage: If you think you have taken too much of this medicine contact a poison control center or emergency room at once. NOTE: This medicine is only for you. Do not share this medicine with others. What if I miss a dose? Keep appointments for follow-up doses. It is important not to miss your dose. Call your care team if you are unable to keep an appointment. What may interact with this medication? Interactions have not been studied. This list may not describe all possible interactions. Give your health care provider a list of all the medicines, herbs, non-prescription drugs, or dietary supplements you use. Also tell them if you smoke, drink alcohol, or use illegal drugs. Some items may interact with your medicine. What should I watch for while using this medication? Your condition will be monitored carefully while you are receiving this medication. You may need blood work while taking this medication. This medication may cause serious skin reactions. They can happen weeks to months after starting the medication. Contact your care team right away if you notice  fevers or flu-like symptoms with a rash. The rash may be red or purple and then turn into blisters or peeling of the skin. You may also notice a red rash with swelling of the face, lips, or lymph nodes in your neck or under your arms. Tell your care team right away if you have any change in your  eyesight. Talk to your care team if you may be pregnant. Serious birth defects can occur if you take this medication during pregnancy and for 4 months after the last dose. You will need a negative pregnancy test before starting this medication. Contraception is recommended while taking this medication and for 4 months after the last dose. Your care team can help you find the option that works for you. Do not breastfeed while taking this medication and for 4 months after the last dose. What side effects may I notice from receiving this medication? Side effects that you should report to your care team as soon as possible: Allergic reactions--skin rash, itching, hives, swelling of the face, lips, tongue, or throat Dry cough, shortness of breath or trouble breathing Eye pain, redness, irritation, or discharge with blurry or decreased vision Heart muscle inflammation--unusual weakness or fatigue, shortness of breath, chest pain, fast or irregular heartbeat, dizziness, swelling of the ankles, feet, or hands Hormone gland problems--headache, sensitivity to light, unusual weakness or fatigue, dizziness, fast or irregular heartbeat, increased sensitivity to cold or heat, excessive sweating, constipation, hair loss, increased thirst or amount of urine, tremors or shaking, irritability Infusion reactions--chest pain, shortness of breath or trouble breathing, feeling faint or lightheaded Kidney injury (glomerulonephritis)--decrease in the amount of urine, red or dark brown urine, foamy or bubbly urine, swelling of the ankles, hands, or feet Liver injury--right upper belly pain, loss of appetite, nausea, light-colored stool, dark yellow or brown urine, yellowing skin or eyes, unusual weakness or fatigue Pain, tingling, or numbness in the hands or feet, muscle weakness, change in vision, confusion or trouble speaking, loss of balance or coordination, trouble walking, seizures Rash, fever, and swollen lymph  nodes Redness, blistering, peeling, or loosening of the skin, including inside the mouth Sudden or severe stomach pain, bloody diarrhea, fever, nausea, vomiting Side effects that usually do not require medical attention (report to your care team if they continue or are bothersome): Bone, joint, or muscle pain Diarrhea Fatigue Loss of appetite Nausea Skin rash This list may not describe all possible side effects. Call your doctor for medical advice about side effects. You may report side effects to FDA at 1-800-FDA-1088. Where should I keep my medication? This medication is given in a hospital or clinic. It will not be stored at home. NOTE: This sheet is a summary. It may not cover all possible information. If you have questions about this medicine, talk to your doctor, pharmacist, or health care provider.  2024 Elsevier/Gold Standard (2021-11-08 00:00:00)       To help prevent nausea and vomiting after your treatment, we encourage you to take your nausea medication as directed.  BELOW ARE SYMPTOMS THAT SHOULD BE REPORTED IMMEDIATELY: *FEVER GREATER THAN 100.4 F (38 C) OR HIGHER *CHILLS OR SWEATING *NAUSEA AND VOMITING THAT IS NOT CONTROLLED WITH YOUR NAUSEA MEDICATION *UNUSUAL SHORTNESS OF BREATH *UNUSUAL BRUISING OR BLEEDING *URINARY PROBLEMS (pain or burning when urinating, or frequent urination) *BOWEL PROBLEMS (unusual diarrhea, constipation, pain near the anus) TENDERNESS IN MOUTH AND THROAT WITH OR WITHOUT PRESENCE OF ULCERS (sore throat, sores in mouth, or a toothache) UNUSUAL RASH, SWELLING  OR PAIN  UNUSUAL VAGINAL DISCHARGE OR ITCHING   Items with * indicate a potential emergency and should be followed up as soon as possible or go to the Emergency Department if any problems should occur.  Please show the CHEMOTHERAPY ALERT CARD or IMMUNOTHERAPY ALERT CARD at check-in to the Emergency Department and triage nurse.  Should you have questions after your visit or need to  cancel or reschedule your appointment, please contact [facility]CER CTR [place] - A DEPT OF [facility] [phone]  and follow the prompts.  Office hours are 8:00 a.m. to 4:30 p.m. Monday - Friday. Please note that voicemails left after 4:00 p.m. may not be returned until the following business day.  We are closed weekends and major holidays. You have access to a nurse at all times for urgent questions. Please call the main number to the clinic [phone] and follow the prompts.  For any non-urgent questions, you may also contact your provider using MyChart. We now offer e-Visits for anyone 42 and older to request care online for non-urgent symptoms. For details visit [email or web address].   Also download the MyChart app! Go to the app store, search "MyChart", open the app, select [place], and log in with your MyChart username and password.

## 2024-04-03 NOTE — Progress Notes (Signed)
 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 female was transferred to my care after her prior physician has left.   ASSESSMENT & PLAN:   Assessment & Plan: Amanda Norris  is a 78 y.o. female with metastatic colon carcinoma  Assessment & Plan Metastatic colon cancer to liver [facility]) Metastatic colon carcinoma, metastatic to liver, lung.  Initially diagnosed in 2015 Extensive oncology history below History patient has a TMB high, currently is on Keytruda  Caris NGS below, also showed data 600 mutation  - Labs reviewed today: CMP: WNL, CBC: WNL, TSH: Normal, CEA: 8.7 - Physical exam stable today.  Proceed with treatment today.  Tolerating well with no complications. - Last CT CAP showed good response to treatment.  Will repeat next month. - Repeat CEA with every cycle  Return to clinic in 6 weeks with CT CAP Chronic diarrhea She has baseline diarrhea since surgery.  - Continue Imodium  as needed.  She is averaging 1-2 Imodium  tablets daily.    The patient understands the plans discussed today and is in agreement with them.  She knows to contact our office if she develops concerns prior to her next appointment.  60 minutes of total time was spent for this patient encounter, including preparation,review of records,  face-to-face counseling with the patient and coordination of care, physical exam, and documentation of the encounter.    Amanda Norris,acting as a Neurosurgeon for Mickiel Dry, MD.,have documented all relevant documentation on the behalf of Mickiel Dry, MD,as directed by  Mickiel Dry, MD while in the presence of Mickiel Dry, MD.  I, Mickiel Dry MD, have  reviewed the above documentation for accuracy and completeness, and I agree with the above.     Mickiel Dry, MD  [facility] - A DEPT OF Amanda Norris Amanda Norris [address] [place] [address] Dept: [phone] Dept Fax: [phone]   Orders Placed This Encounter  Procedures   CT CHEST ABDOMEN PELVIS W CONTRAST    Standing Status:   Future    Expected Date:   05/03/2024    Expiration Date:   04/03/2025    If indicated for the ordered procedure, I authorize the administration of contrast media per Radiology protocol:   Yes    Does the patient have a contrast media/X-ray dye allergy?:   No    Preferred imaging location?:   [facility]    If indicated for the ordered procedure, I authorize the administration of oral contrast media per Radiology protocol:   Yes     ONCOLOGY HISTORY:   I have reviewed her chart and materials related to her cancer extensively and collaborated history with the patient. Summary of oncologic history is as follows:   Diagnosis: Metastatic adenocarcinoma the proximal transverse colon, BRAF V600E+, TMB high   -CT AP: 12/02/2013: CT AP: Area concerning for neoplasm in the proximal transverse colon. In this area, there is focal wall thickening with what appears to be some mucosal disruption. This area measures 6.2 x 4.3 cm in length. There is some subtle mesenteric thickening in this area; spread of tumor to the immediate adjacent mesenteric region is suspected. Small nodular lesions in  the lung bases. Questionable mass versus inflammation in the head of the pancreas.  -12/05/2013: MRI abdomen: 18 x 9 mm cystic lesion in the posterior pancreatic head appears to communicate with the main pancreatic duct. Side branch IPMN would be a distinct consideration. Immediately anterior to this nonenhancing cystic lesion is a 10 x 23 mm subtle area of altered signal and hypo enhancement within the pancreatic  head.  -12/11/2013: CEA 3.8.  -12/12/2013: Colonoscopy: Proximal transverse colon mass, biopsied.  Pathology: Invasive adenocarcinoma.  -12/29/2013: Right hemicolectomy. Pathology: Grade 2 invasive colorectal adenocarcinoma, 7 cm, extending into pericolonic tissue. Metastatic carcinoma in 6 of 33 lymph nodes. Margins not involved. pT3, pN2a, MX -12/31/2013: CT chest: Multiple small bilateral pulmonary nodules may represent benign granulomatous disease. Perihepatic ascites and some free intraperitoneal gas, consistent with recent surgical history.  -02/10/2014-03/10/2014: 3 cycles of FOLFOX completed, discontinued secondary to intolerance -03/10/2014: DPD gene mutation negative -12/10/2014: CT AP: Status post right hemicolectomy with 2 new adjacent soft tissue nodules in the small bowel mesenteric worrisome for recurrent disease. -02/01/2015: Initial PET: Mild abdominal mesenteric lymphadenopathy shows hypermetabolic activity, consistent with metastatic disease. No other definite sites of metastatic disease identified. 7 mm indeterminate pulmonary nodule in the superior segment of the right lower lobe shows no associated metabolic activity, but remains too small to accurately characterize by PET.  -03/24/2015: PET: Mild progression of nodal metastases in the anterior mid abdominal mesentery. New nodal metastasis at the left thoracic inlet. -06/22/2015: Resolution of metabolic activity and decreased size of mild lymphadenopathy at left thoracic inlet. Stable mild mid abdominal hypermetabolic mesenteric lymphadenopathy, consistent with metastatic disease. Stable 6 mm posterior left lower lobe pulmonary nodule, without associated metabolic activity. No new or progressive disease identified. -2017-09/20/2020: Patient was under surveillance with imaging showing stable disease. -03/28/2018: Colonoscopy: One 8 mm polyp in the transverse colon, resected. Diverticulosis in the sigmoid colon, descending colon,  splenic flexure, and transverse colon.  Pathology: Sessile serrated polyp without cytologic dysplasia.  -10/28/2018: CEA: 4.6 -09/17/2020: CEA: 21.4 -09/21/2020: CT AP: A soft tissue nodule within the central mesentery in the ventral abdomen has substantially increased in size, now encasing the proximal superior mesenteric artery and measuring 5.0 x 4.3 cm, previously 1.9 x 1.7 cm. Numerous new small pulmonary nodules throughout the included lung bases, measuring up to 5 mm. There is a new hypodense lesion of the anterior right lobe of the liver measuring 1.4 x 1.0 cm. Numerous newly enlarged retrocrural and periaortic lymph nodes in the included lower chest as well as numerous newly enlarged retroperitoneal lymph nodes. -09/27/2020: CT chest: Findings of metastatic disease in the chest with LEFT thoracic inlet lymph node, mediastinal lymph nodes and multiple pulmonary nodules in addition to abdominal findings which were outlined in the recent comparison study. -09/28/2020: Mesenteric lymph node biopsy.   Pathology: Mucin and fibrosis  -10/14/2020: Left supraclavicular lymph node biopsy.  Pathology: Soft tissue, mucin, and rare fragments of atypical columnar epithelium. Findings likely represent metastatic colorectal adenocarcinoma. There is likely  insufficient material for additional studies.  -11/09/2020: Caris: BRAF V600 E+, MMR deficient, MSI high, TMB high,23mut/Mb HER2 negative, BRCA1/2 pathogenic variant on exon 14 and exon 9 respectively.  The report also suggested decreased benefit to FOLFOX and bevacizumab  in the first-line metastatic setting.  -11/09/2020: Liver biopsy:  Pathology: Adenocarcinoma with abundant mucin consistent with metastatic mucinous adenocarcinoma. Tumor cells are positive with CDX2 and cytokeratin 20. -11/10/2020-12/08/2020: 3 cycles of FOLFIRI and reduced bevacizumab  completed -01/13/2021-current: Keytruda , transitioned to every 6  weeks on 03/22/2023 -2022-01/2024:  Patient's imaging has showed stable to improved metastatic disease   Current Treatment:  Keytruda  400mg  every 6 weeks  INTERVAL HISTORY:   Amanda Norris is here today for follow up and to establish care with me for transverse colon adenocarcinoma. Patient is accompanied by her husband and son.   Amanda Norris reports 2 episodes of diarrhea daily, requiring Imodium  daily. She states that before taking imodium  her episodes were up to 5 times daily. She denies loss of appetite, hematochezia, rash, or dyspnea.  Amanda Norris is hesitant about remaining on Keytruda  lifelong. We discussed the possibility of discontinuing Keytruda  if cancer markers and imaging continue to be stable to improved. She reports experiencing dry skin and dry eyes as side effects of the medication.  Amanda Norris notes wheezing in the left upper lobe. She states she has a productive cough in the mornings and denies having COPD or a cold.   I have reviewed the past medical history, past surgical history, social history and family history with the patient and they are unchanged from previous note.  ALLERGIES:  is allergic to sulfa antibiotics.  MEDICATIONS:  Current Outpatient Medications  Medication Sig Dispense Refill   acetaminophen  (TYLENOL ) 500 MG tablet Take 500 mg by mouth every 6 (six) hours as needed for moderate pain or headache.     aspirin 81 MG EC tablet Take 1 tablet (81 mg total) by mouth daily. Swallow whole. 30 tablet 12   Bioflavonoid Products (ESTER C PO) Take 1,000 mg by mouth daily.     Cholecalciferol (D3-1000) 25 MCG (1000 UT) capsule Take 125 Units by mouth daily.     Cranberry-Vitamin C-Vitamin E 4200-20-3 MG-MG-UNIT CAPS Take 500 mg by mouth daily.     Garlic 1000 MG CAPS Take 1,000 mg by mouth daily.      lidocaine -prilocaine  (EMLA ) cream Apply small amount to port a cath site and cover with plastic wrap 1 hour prior to infusion appointments 30 g 3   Multiple Vitamin (MULTIVITAMIN) tablet Take 1 tablet by mouth daily.      OVER THE COUNTER MEDICATION Take 1,000 mg by mouth daily. Moringa 1000 mg daily     zinc gluconate 50 MG tablet Take 50 mg by mouth daily.     No current facility-administered medications for this visit.   Facility-Administered Medications Ordered in Other Visits  Medication Dose Route Frequency Provider Last Rate Last Admin   sodium chloride  flush (NS) 0.9 % injection 10 mL  10 mL Intracatheter PRN Rogers Hai, MD   10 mL at 01/10/24 1434    REVIEW OF SYSTEMS:   Constitutional: Denies fevers, chills or abnormal weight loss Eyes: Denies blurriness of vision Ears, nose, mouth, throat, and face: Denies mucositis or sore throat Respiratory: Denies cough, dyspnea or wheezes Cardiovascular: Denies palpitation, chest discomfort or lower extremity swelling Gastrointestinal:  Denies nausea, heartburn or change in bowel habits Skin: Denies abnormal skin rashes Lymphatics: Denies new lymphadenopathy or easy bruising Neurological:Denies numbness, tingling or new weaknesses Behavioral/Psych: Mood is stable, no new changes  All other systems were reviewed with the patient and are negative.   VITALS:  Blood pressure (!) 160/67, weight 159 lb 12.8 oz (72.5 kg).  Wt Readings from Last 3 Encounters:  04/03/24 159 lb 12.8 oz (72.5 kg)  02/21/24 160 lb 9.6 oz (72.8 kg)  02/14/24 159 lb 13.3 oz (72.5 kg)    Body mass index is 28.31 kg/m.  Performance status (ECOG): 2 - Symptomatic, <50% confined to  bed  PHYSICAL EXAM:   GENERAL:alert, no distress and comfortable SKIN: skin color, texture, turgor are normal, no rashes or significant lesions EYES: normal, Conjunctiva are pink and non-injected, sclera clear OROPHARYNX:no exudate, no erythema and lips, buccal mucosa, and tongue normal  NECK: supple, thyroid  normal size, non-tender, without nodularity LYMPH:  no palpable lymphadenopathy in the cervical, axillary or inguinal LUNGS: Positive for wheezing in left upper lobe with normal  breathing effort HEART: regular rate & rhythm and no murmurs and no lower extremity edema ABDOMEN:abdomen soft, non-tender and normal bowel sounds Musculoskeletal:no cyanosis of digits and no clubbing  NEURO: alert & oriented x 3 with fluent speech, no focal motor/sensory deficits  LABORATORY DATA:  I have reviewed the data as listed  Lab Results  Component Value Date   WBC 7.2 04/03/2024   NEUTROABS 5.2 04/03/2024   HGB 12.9 04/03/2024   HCT 39.7 04/03/2024   MCV 91.7 04/03/2024   PLT 170 04/03/2024      Chemistry      Component Value Date/Time   NA 138 04/03/2024 0845   K 3.8 04/03/2024 0845   CL 103 04/03/2024 0845   CO2 23 04/03/2024 0845   BUN 15 04/03/2024 0845   CREATININE 0.70 04/03/2024 0845   CREATININE 0.68 08/26/2020 0944      Component Value Date/Time   CALCIUM  9.0 04/03/2024 0845   ALKPHOS 60 04/03/2024 0845   AST 28 04/03/2024 0845   ALT 21 04/03/2024 0845   BILITOT 0.4 04/03/2024 0845       Latest Reference Range & Units 02/14/24 13:32  CEA 0.0 - 4.7 ng/mL 8.7 (H)  (H): Data is abnormally high  RADIOGRAPHIC STUDIES: I have personally reviewed the radiological images as listed and agreed with the findings in the report.  CT CHEST ABDOMEN PELVIS W CONTRAST EXAM:  CT CHEST ABDOMEN PELVIS WITH IV CONTRAST  INDICATION:  Metastatic disease evaluation  TECHNIQUE: Spiral CT scanning was performed through the chest, abdomen and pelvis after the patient received oral and IV contrast.  COMPARISON: 08/30/2023  FINDINGS: The cardiac size is within normal limits. There is no thoracic aortic aneurysm. No filling defects are identified in the central pulmonary arteries. The esophagus and thyroid  glands have a normal appearance. A 1.5 x 1.4 cm right paratracheal lymph node is present which has not changed significantly. The small retrocrural nodes in the lower chest are unchanged. The left supraclavicular lymph nodes have a combined dimension of 2.0 x 1.2  cm (previously 2.5 x 1.4 cm). No pleural or pericardial effusion is present. Scattered subcentimeter soft tissue and groundglass attenuation nodules are present in both lungs. The number of nodules does not appear to have changed. The largest nodule on the right is visualized on image 89/4. This measures 6 mm (previously 7 mm). The largest nodule on the left is visualized in the posterior lingula on image 93/4. It measures 5 mm which is unchanged.  There is redemonstration of a small hypodense hepatic mass in segment 4. This measures 0.9 x 0.5 cm (previously 1.6 x 0.7 cm). No developing hepatic masses are identified. The partially calcified central mesenteric mass measures 4.1 x 2.9 cm (previously 4.4 x 3.1 cm).  There is no significant abnormality identified in the spleen, pancreas and gallbladder. No calculus, obstruction, or soft tissue mass is present involving the kidneys. There are no adrenal masses. There are stable postsurgical changes from partial colectomy and ileocolic anastomosis. No local tumor recurrence is identified. No ascites is present. There  has been a decrease in retroperitoneal adenopathy. The largest node is visualized on image 70/2, measuring 6 mm (previously 9 mm). No abdominal aortic aneurysm is present.  The bladder and uterus have a normal appearance. There is no adnexal mass. Bilateral adnexal varicosities are present, unchanged. There is no inguinal hernia. Mild sigmoid diverticulosis is present with no associated inflammation.  There is no fracture or bone destruction.  IMPRESSION: 1. Positive treatment response with no areas of new or enlarging metastatic disease are identified. 2. Mild decrease in the left supraclavicular adenopathy. 3. Further slight decrease in the largest pulmonary nodule, in the right lower lobe. 4. Further decrease in the size of the hepatic mass. 5. Further decrease in the size of the central mesenteric mass. 6. Further  decrease in the size of the retroperitoneal adenopathy.  Please note that CT scanning at this site utilizes multiple dose reduction techniques, including automatic exposure control, adjustment of the MAA and/or KVP according to the patient's size, and use of iterative reconstruction.  Electronically signed by: Eddy Oar MD 02/13/2024 10:22 AM EDT RP Workstation: [id number]

## 2024-04-03 NOTE — Assessment & Plan Note (Addendum)
 She has baseline diarrhea since surgery.  - Continue Imodium  as needed.  She is averaging 1-2 Imodium  tablets daily.

## 2024-04-03 NOTE — Assessment & Plan Note (Signed)
 Metastatic colon carcinoma, metastatic to liver, lung.  Initially diagnosed in 2015 Extensive oncology history below History patient has a TMB high, currently is on Keytruda  Caris NGS below, also showed data 600 mutation  - Labs reviewed today: CMP: WNL, CBC: WNL, TSH: Normal, CEA: 8.7 - Physical exam stable today.  Proceed with treatment today.  Tolerating well with no complications. - Last CT CAP showed good response to treatment.  Will repeat next month. - Repeat CEA with every cycle  Return to clinic in 6 weeks with CT CAP

## 2024-04-04 LAB — CEA: CEA: 8.2 ng/mL — ABNORMAL HIGH (ref 0.0–4.7)

## 2024-04-04 LAB — T4: T4, Total: 9 ug/dL (ref 4.5–12.0)

## 2024-05-06 ENCOUNTER — Ambulatory Visit (HOSPITAL_COMMUNITY)
Admission: RE | Admit: 2024-05-06 | Discharge: 2024-05-06 | Disposition: A | Discharge status: Home / Self Care | Source: Ambulatory Visit | Attending: Oncology | Admitting: Oncology

## 2024-05-06 DIAGNOSIS — C787 Secondary malignant neoplasm of liver and intrahepatic bile duct: Secondary | ICD-10-CM | POA: Insufficient documentation

## 2024-05-06 DIAGNOSIS — R918 Other nonspecific abnormal finding of lung field: Secondary | ICD-10-CM | POA: Diagnosis not present

## 2024-05-06 DIAGNOSIS — C189 Malignant neoplasm of colon, unspecified: Secondary | ICD-10-CM | POA: Insufficient documentation

## 2024-05-06 DIAGNOSIS — K769 Liver disease, unspecified: Secondary | ICD-10-CM | POA: Diagnosis not present

## 2024-05-06 MED ORDER — IOHEXOL 9 MG/ML PO SOLN
ORAL | Status: AC
  Filled 2024-05-06: qty 1000

## 2024-05-06 MED ORDER — IOHEXOL 350 MG/ML SOLN
80.0000 mL | Freq: Once | INTRAVENOUS | Status: AC | PRN
  Administered 2024-05-06: 80 mL via INTRAVENOUS

## 2024-05-08 ENCOUNTER — Inpatient Hospital Stay

## 2024-05-08 ENCOUNTER — Ambulatory Visit

## 2024-05-14 ENCOUNTER — Other Ambulatory Visit: Payer: Self-pay | Admitting: Oncology

## 2024-05-14 DIAGNOSIS — C787 Secondary malignant neoplasm of liver and intrahepatic bile duct: Secondary | ICD-10-CM

## 2024-05-14 DIAGNOSIS — Z95828 Presence of other vascular implants and grafts: Secondary | ICD-10-CM

## 2024-05-15 ENCOUNTER — Inpatient Hospital Stay

## 2024-05-15 ENCOUNTER — Encounter: Payer: Self-pay | Admitting: Oncology

## 2024-05-15 ENCOUNTER — Inpatient Hospital Stay (HOSPITAL_BASED_OUTPATIENT_CLINIC_OR_DEPARTMENT_OTHER): Admitting: Oncology

## 2024-05-15 ENCOUNTER — Inpatient Hospital Stay: Attending: Hematology

## 2024-05-15 VITALS — BP 158/68 | HR 55 | Temp 97.4°F | Resp 17

## 2024-05-15 DIAGNOSIS — Z79620 Long term (current) use of immunosuppressive biologic: Secondary | ICD-10-CM | POA: Diagnosis not present

## 2024-05-15 DIAGNOSIS — C189 Malignant neoplasm of colon, unspecified: Secondary | ICD-10-CM | POA: Diagnosis not present

## 2024-05-15 DIAGNOSIS — C7800 Secondary malignant neoplasm of unspecified lung: Secondary | ICD-10-CM | POA: Insufficient documentation

## 2024-05-15 DIAGNOSIS — C787 Secondary malignant neoplasm of liver and intrahepatic bile duct: Secondary | ICD-10-CM | POA: Diagnosis not present

## 2024-05-15 DIAGNOSIS — Z5112 Encounter for antineoplastic immunotherapy: Secondary | ICD-10-CM | POA: Insufficient documentation

## 2024-05-15 DIAGNOSIS — C184 Malignant neoplasm of transverse colon: Secondary | ICD-10-CM | POA: Diagnosis not present

## 2024-05-15 DIAGNOSIS — K529 Noninfective gastroenteritis and colitis, unspecified: Secondary | ICD-10-CM

## 2024-05-15 DIAGNOSIS — Z95828 Presence of other vascular implants and grafts: Secondary | ICD-10-CM

## 2024-05-15 DIAGNOSIS — C778 Secondary and unspecified malignant neoplasm of lymph nodes of multiple regions: Secondary | ICD-10-CM | POA: Insufficient documentation

## 2024-05-15 LAB — CBC WITH DIFFERENTIAL/PLATELET
Abs Immature Granulocytes: 0.01 K/uL (ref 0.00–0.07)
Basophils Absolute: 0 K/uL (ref 0.0–0.1)
Basophils Relative: 0 %
Eosinophils Absolute: 0.1 K/uL (ref 0.0–0.5)
Eosinophils Relative: 2 %
HCT: 40.1 % (ref 36.0–46.0)
Hemoglobin: 13.1 g/dL (ref 12.0–15.0)
Immature Granulocytes: 0 %
Lymphocytes Relative: 18 %
Lymphs Abs: 1.2 K/uL (ref 0.7–4.0)
MCH: 30.2 pg (ref 26.0–34.0)
MCHC: 32.7 g/dL (ref 30.0–36.0)
MCV: 92.4 fL (ref 80.0–100.0)
Monocytes Absolute: 0.8 K/uL (ref 0.1–1.0)
Monocytes Relative: 11 %
Neutro Abs: 4.9 K/uL (ref 1.7–7.7)
Neutrophils Relative %: 69 %
Platelets: 177 K/uL (ref 150–400)
RBC: 4.34 MIL/uL (ref 3.87–5.11)
RDW: 13.2 % (ref 11.5–15.5)
WBC: 7 K/uL (ref 4.0–10.5)
nRBC: 0 % (ref 0.0–0.2)

## 2024-05-15 LAB — COMPREHENSIVE METABOLIC PANEL WITH GFR
ALT: 17 U/L (ref 0–44)
AST: 27 U/L (ref 15–41)
Albumin: 4.3 g/dL (ref 3.5–5.0)
Alkaline Phosphatase: 72 U/L (ref 38–126)
Anion gap: 10 (ref 5–15)
BUN: 13 mg/dL (ref 8–23)
CO2: 26 mmol/L (ref 22–32)
Calcium: 9.2 mg/dL (ref 8.9–10.3)
Chloride: 102 mmol/L (ref 98–111)
Creatinine, Ser: 0.64 mg/dL (ref 0.44–1.00)
GFR, Estimated: >60 mL/min (ref >60)
Glucose, Bld: 91 mg/dL (ref 70–99)
Potassium: 3.9 mmol/L (ref 3.5–5.1)
Sodium: 138 mmol/L (ref 135–145)
Total Bilirubin: 0.4 mg/dL (ref 0.0–1.2)
Total Protein: 7.8 g/dL (ref 6.5–8.1)

## 2024-05-15 LAB — MAGNESIUM: Magnesium: 2.3 mg/dL (ref 1.7–2.4)

## 2024-05-15 MED ORDER — SODIUM CHLORIDE 0.9 % IV SOLN
400.0000 mg | Freq: Once | INTRAVENOUS | Status: AC
  Administered 2024-05-15: 400 mg via INTRAVENOUS
  Filled 2024-05-15: qty 16

## 2024-05-15 MED ORDER — SODIUM CHLORIDE 0.9 % IV SOLN: Freq: Once | INTRAVENOUS | Status: AC

## 2024-05-15 NOTE — Patient Instructions (Signed)
 [facility] [place] - A DEPT OF [place]  Discharge Instructions: Thank you for choosing [facility] to provide your oncology and hematology care.  If you have a lab appointment with the Cancer Center - please note that after April 8th, 2024, all labs will be drawn in the cancer center.  You do not have to check in or register with the main entrance as you have in the past but will complete your check-in in the cancer center.  Wear comfortable clothing and clothing appropriate for easy access to any Portacath or PICC line.   We strive to give you quality time with your provider. You may need to reschedule your appointment if you arrive late (15 or more minutes).  Arriving late affects you and other patients whose appointments are after yours.  Also, if you miss three or more appointments without notifying the office, you may be dismissed from the clinic at the provider's discretion.      For prescription refill requests, have your pharmacy contact our office and allow 72 hours for refills to be completed.    Today you received the following chemotherapy and/or immunotherapy agents keytruda     To help prevent nausea and vomiting after your treatment, we encourage you to take your nausea medication as directed.  BELOW ARE SYMPTOMS THAT SHOULD BE REPORTED IMMEDIATELY: *FEVER GREATER THAN 100.4 F (38 C) OR HIGHER *CHILLS OR SWEATING *NAUSEA AND VOMITING THAT IS NOT CONTROLLED WITH YOUR NAUSEA MEDICATION *UNUSUAL SHORTNESS OF BREATH *UNUSUAL BRUISING OR BLEEDING *URINARY PROBLEMS (pain or burning when urinating, or frequent urination) *BOWEL PROBLEMS (unusual diarrhea, constipation, pain near the anus) TENDERNESS IN MOUTH AND THROAT WITH OR WITHOUT PRESENCE OF ULCERS (sore throat, sores in mouth, or a toothache) UNUSUAL RASH, SWELLING OR PAIN  UNUSUAL VAGINAL DISCHARGE OR ITCHING   Items with * indicate a potential emergency and should be followed up as  soon as possible or go to the Emergency Department if any problems should occur.  Please show the CHEMOTHERAPY ALERT CARD or IMMUNOTHERAPY ALERT CARD at check-in to the Emergency Department and triage nurse.  Should you have questions after your visit or need to cancel or reschedule your appointment, please contact [facility] [place] - A DEPT OF [facility] [phone]  and follow the prompts.  Office hours are 8:00 a.m. to 4:30 p.m. Monday - Friday. Please note that voicemails left after 4:00 p.m. may not be returned until the following business day.  We are closed weekends and major holidays. You have access to a nurse at all times for urgent questions. Please call the main number to the clinic [phone] and follow the prompts.  For any non-urgent questions, you may also contact your provider using MyChart. We now offer e-Visits for anyone 47 and older to request care online for non-urgent symptoms. For details visit [email or web address].   Also download the MyChart app! Go to the app store, search MyChart, open the app, select  [place], and log in with your MyChart username and password.

## 2024-05-15 NOTE — Progress Notes (Signed)
 Patient tolerated therapy with no complaints voiced.  Side effects with management reviewed with understanding verbalized.  Port site clean and dry with no bruising or swelling noted at site.  Good blood return noted before and after administration of therapy.  Band aid applied.  Patient left in satisfactory condition with VSS and no s/s of distress noted.

## 2024-05-15 NOTE — Progress Notes (Signed)
 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 female with metastatic colon carcinoma  Metastatic colon cancer to liver Metastatic colon carcinoma, metastatic to liver, lung.  Initially diagnosed in 2015 Extensive oncology history below History patient has a TMB high, currently is on Keytruda  Caris NGS below, also showed BRAF V 600E mutation   - Labs reviewed today: CMP: WNL, CBC: WNL, TSH: Normal, CEA: 8.2 - Physical exam stable today.  Proceed with treatment today.  Tolerating well with no complications. -We reviewed the recent CT scan findings together.  Patient has increase in size of lung nodule along with soft tissue nodule.  Discussed various treatment options at this time including radiation to these 2 concerning spots and continuing Keytruda  versus changing treatments to either chemotherapy or BRAF inhibitors.  Patient at this time is considering radiation therapy.  Patient previously did not tolerate chemotherapy well. - Will continue Keytruda  every 6 weeks at this time. - Repeat CEA with every cycle.  CEA from today pending.   Return to clinic in 6 weeks after evaluation by radiation oncology.  Chronic diarrhea She has baseline diarrhea since surgery.   - Continue Imodium  as needed.  She is averaging 1-2 Imodium  tablets daily.    The patient understands the plans discussed today and is in agreement with them.  She knows to contact our office if she develops concerns prior to her next appointment.  The total time spent in the appointment was 20 minutes for the encounter with patient, including review of chart and various tests  results, discussions about plan of care and coordination of care plan   Mickiel Dry, MD  [facility] [place] - A DEPT OF [facility] [address] [place] [address] Dept: [phone] Dept Fax: [phone]   No orders of the defined types were placed in this encounter.    ONCOLOGY HISTORY:   I have reviewed her chart and materials related to her cancer extensively and collaborated history with the patient. Summary of oncologic history is as follows:   Diagnosis: Metastatic adenocarcinoma the proximal transverse colon, BRAF V600E+, TMB high   -CT AP: 12/02/2013: CT AP: Area concerning for neoplasm in the proximal transverse colon. In this area, there is focal wall thickening with what appears to be some mucosal disruption. This area measures 6.2 x 4.3 cm in length. There is some subtle mesenteric thickening in this area; spread of tumor to the immediate adjacent mesenteric region is suspected. Small nodular lesions in the lung bases. Questionable mass versus inflammation in the head of the pancreas.  -12/05/2013: MRI abdomen: 18 x 9 mm cystic lesion in the posterior pancreatic head appears to communicate with the main pancreatic duct. Side branch IPMN would be a distinct consideration. Immediately anterior to this nonenhancing cystic lesion is a 10 x 23 mm subtle area of altered signal and hypo enhancement within the pancreatic head.  -12/11/2013: CEA 3.8.  -12/12/2013: Colonoscopy: Proximal transverse colon mass, biopsied.  Pathology: Invasive adenocarcinoma.  -12/29/2013: Right hemicolectomy. Pathology: Grade 2 invasive colorectal adenocarcinoma, 7 cm, extending into pericolonic tissue. Metastatic carcinoma in 6 of  33 lymph nodes. Margins not involved. pT3, pN2a, MX -12/31/2013: CT chest: Multiple small bilateral pulmonary nodules may represent benign granulomatous disease. Perihepatic ascites and some free  intraperitoneal gas, consistent with recent surgical history.  -02/10/2014-03/10/2014: 3 cycles of FOLFOX completed, discontinued secondary to intolerance -03/10/2014: DPD gene mutation negative -12/10/2014: CT AP: Status post right hemicolectomy with 2 new adjacent soft tissue nodules in the small bowel mesenteric worrisome for recurrent disease. -02/01/2015: Initial PET: Mild abdominal mesenteric lymphadenopathy shows hypermetabolic activity, consistent with metastatic disease. No other definite sites of metastatic disease identified. 7 mm indeterminate pulmonary nodule in the superior segment of the right lower lobe shows no associated metabolic activity, but remains too small to accurately characterize by PET.  -03/24/2015: PET: Mild progression of nodal metastases in the anterior mid abdominal mesentery. New nodal metastasis at the left thoracic inlet. -06/22/2015: Resolution of metabolic activity and decreased size of mild lymphadenopathy at left thoracic inlet. Stable mild mid abdominal hypermetabolic mesenteric lymphadenopathy, consistent with metastatic disease. Stable 6 mm posterior left lower lobe pulmonary nodule, without associated metabolic activity. No new or progressive disease identified. -2017-09/20/2020: Patient was under surveillance with imaging showing stable disease. -03/28/2018: Colonoscopy: One 8 mm polyp in the transverse colon, resected. Diverticulosis in the sigmoid colon, descending colon, splenic flexure, and transverse colon.  Pathology: Sessile serrated polyp without cytologic dysplasia.  -10/28/2018: CEA: 4.6 -09/17/2020: CEA: 21.4 -09/21/2020: CT AP: A soft tissue nodule within the central mesentery in the ventral abdomen has substantially increased in size, now encasing the proximal superior mesenteric artery and measuring 5.0 x 4.3 cm, previously 1.9 x 1.7 cm. Numerous new small pulmonary nodules throughout the included lung bases, measuring up to 5 mm. There is a new  hypodense lesion of the anterior right lobe of the liver measuring 1.4 x 1.0 cm. Numerous newly enlarged retrocrural and periaortic lymph nodes in the included lower chest as well as numerous newly enlarged retroperitoneal lymph nodes. -09/27/2020: CT chest: Findings of metastatic disease in the chest with LEFT thoracic inlet lymph node, mediastinal lymph nodes and multiple pulmonary nodules in addition to abdominal findings which were outlined in the recent comparison study. -09/28/2020: Mesenteric lymph node biopsy.   Pathology: Mucin and fibrosis  -10/14/2020: Left supraclavicular lymph node biopsy.  Pathology: Soft tissue, mucin, and rare fragments of atypical columnar epithelium. Findings likely represent metastatic colorectal adenocarcinoma. There is likely  insufficient material for additional studies.  -11/09/2020: Caris: BRAF V600 E+, MMR deficient, MSI high, TMB high,46mut/Mb HER2 negative, BRCA1/2 pathogenic variant on exon 14 and exon 9 respectively.  The report also suggested decreased benefit to FOLFOX and bevacizumab  in the first-line metastatic setting.  -11/09/2020: Liver biopsy:  Pathology: Adenocarcinoma with abundant mucin consistent with metastatic mucinous adenocarcinoma. Tumor cells are positive with CDX2 and cytokeratin 20. -11/10/2020-12/08/2020: 3 cycles of FOLFIRI and reduced bevacizumab  completed -01/13/2021-current: Keytruda , transitioned to every 6 weeks on 03/22/2023 -2022-01/2024: Patient's imaging has showed stable to improved metastatic disease - 05/06/2024: CT CAP: Slight enlargement of a nodule of the anterior right pulmonary apex measuring 1.5 x 1.2 cm, previously 1.2 x 1.0 cm. Additional small bilateral pulmonary nodules unchanged. Similar size of enlarged pretracheal pretracheal and left supraclavicular lymph nodes. Minimal enlargement of a soft tissue mass in the central small bowel mesentery with central calcification measuring 4.5 x 3.2 cm, previously 4.0 x 2.9  cm. No significant change in an elongated residual of a treated lesion of the anterior left lobe of the liver. Unchanged treated subcentimeter retroperitoneal lymph  nodes.   Current Treatment:  Keytruda  400mg  every 6 weeks  INTERVAL HISTORY:   Discussed the use of AI scribe software for clinical note transcription with the patient, who gave verbal consent to proceed.  History of Present Illness Amanda Norris is a 78 year old female with metastatic colon cancer who presents for follow-up and treatment with Keytruda .  She is currently undergoing treatment with Keytruda  for metastatic colon cancer. Recent imaging studies were discussed, noting changes in the size of a lung lesion and a soft tissue mass in the mesentery, which has been present for a long time but has increased in size.  She has a history of poor tolerance to chemotherapy, describing it as 'rough' and 'wasn't fun'. Despite this, she initially had an 'amazing response' to Keytruda . She continues to take Keytruda  and is considering additional treatment options due to the recent changes in her condition.  Her current medication regimen includes taking one to two Imodiums daily to manage diarrhea, which she describes as 'just like usual'. No new symptoms such as rash, diarrhea, difficulty breathing, or abdominal pain. During the review of symptoms, she denies any immune-mediated complications.    I have reviewed the past medical history, past surgical history, social history and family history with the patient and they are unchanged from previous note.  ALLERGIES:  is allergic to sulfa antibiotics.  MEDICATIONS:  Current Outpatient Medications  Medication Sig Dispense Refill   acetaminophen  (TYLENOL ) 500 MG tablet Take 500 mg by mouth every 6 (six) hours as needed for moderate pain or headache.     aspirin 81 MG EC tablet Take 1 tablet (81 mg total) by mouth daily. Swallow whole. 30 tablet 12   Bioflavonoid Products (ESTER C PO)  Take 1,000 mg by mouth daily.     Cholecalciferol (D3-1000) 25 MCG (1000 UT) capsule Take 125 Units by mouth daily.     Cranberry-Vitamin C-Vitamin E 4200-20-3 MG-MG-UNIT CAPS Take 500 mg by mouth daily.     Garlic 1000 MG CAPS Take 1,000 mg by mouth daily.      lidocaine -prilocaine  (EMLA ) cream Apply small amount to port a cath site and cover with plastic wrap 1 hour prior to infusion appointments 30 g 3   Multiple Vitamin (MULTIVITAMIN) tablet Take 1 tablet by mouth daily.     OVER THE COUNTER MEDICATION Take 1,000 mg by mouth daily. Moringa 1000 mg daily     zinc gluconate 50 MG tablet Take 50 mg by mouth daily.     No current facility-administered medications for this visit.   Facility-Administered Medications Ordered in Other Visits  Medication Dose Route Frequency Provider Last Rate Last Admin   sodium chloride  flush (NS) 0.9 % injection 10 mL  10 mL Intracatheter PRN Rogers Hai, MD   10 mL at 01/10/24 1434    REVIEW OF SYSTEMS:   Constitutional: Denies fevers, chills or abnormal weight loss Eyes: Denies blurriness of vision Ears, nose, mouth, throat, and face: Denies mucositis or sore throat Respiratory: Denies cough, dyspnea or wheezes Cardiovascular: Denies palpitation, chest discomfort or lower extremity swelling Gastrointestinal:  Denies nausea, heartburn or change in bowel habits Skin: Denies abnormal skin rashes Lymphatics: Denies new lymphadenopathy or easy bruising Neurological:Denies numbness, tingling or new weaknesses Behavioral/Psych: Mood is stable, no new changes  All other systems were reviewed with the patient and are negative.   VITALS:  There were no vitals taken for this visit.  Wt Readings from Last 3 Encounters:  05/15/24 158 lb  14.4 oz (72.1 kg)  04/03/24 159 lb 12.8 oz (72.5 kg)  02/21/24 160 lb 9.6 oz (72.8 kg)    There is no height or weight on file to calculate BMI.  Performance status (ECOG): 2 - Symptomatic, <50% confined to  bed  PHYSICAL EXAM:   GENERAL:alert, no distress and comfortable SKIN: skin color, texture, turgor are normal, no rashes or significant lesions EYES: normal, Conjunctiva are pink and non-injected, sclera clear OROPHARYNX:no exudate, no erythema and lips, buccal mucosa, and tongue normal  NECK: supple, thyroid  normal size, non-tender, without nodularity LYMPH:  no palpable lymphadenopathy in the cervical, axillary or inguinal LUNGS: No abnormal breath sounds.  Normal breathing effort.  HEART: regular rate & rhythm and no murmurs and no lower extremity edema ABDOMEN:abdomen soft, non-tender and normal bowel sounds Musculoskeletal:no cyanosis of digits and no clubbing  NEURO: alert & oriented x 3 with fluent speech   LABORATORY DATA:  I have reviewed the data as listed  Lab Results  Component Value Date   WBC 7.0 05/15/2024   NEUTROABS 4.9 05/15/2024   HGB 13.1 05/15/2024   HCT 40.1 05/15/2024   MCV 92.4 05/15/2024   PLT 177 05/15/2024      Chemistry      Component Value Date/Time   NA 138 05/15/2024 0929   K 3.9 05/15/2024 0929   CL 102 05/15/2024 0929   CO2 26 05/15/2024 0929   BUN 13 05/15/2024 0929   CREATININE 0.64 05/15/2024 0929   CREATININE 0.68 08/26/2020 0944      Component Value Date/Time   CALCIUM  9.2 05/15/2024 0929   ALKPHOS 72 05/15/2024 0929   AST 27 05/15/2024 0929   ALT 17 05/15/2024 0929   BILITOT 0.4 05/15/2024 0929      Latest Reference Range & Units 04/03/24 08:45  TSH 0.350 - 4.500 uIU/mL 1.769  Thyroxine (T4) 4.5 - 12.0 ug/dL 9.0    Latest Reference Range & Units 04/03/24 08:45  CEA 0.0 - 4.7 ng/mL 8.2 (H)  (H): Data is abnormally high  RADIOGRAPHIC STUDIES: I have personally reviewed the radiological images as listed and agreed with the findings in the report.  CT CHEST ABDOMEN PELVIS W CONTRAST CLINICAL DATA:  Metastatic colon cancer, assess treatment response * Tracking Code: BO *  EXAM: CT CHEST, ABDOMEN, AND PELVIS WITH  CONTRAST  TECHNIQUE: Multidetector CT imaging of the chest, abdomen and pelvis was performed following the standard protocol during bolus administration of intravenous contrast.  RADIATION DOSE REDUCTION: This exam was performed according to the departmental dose-optimization program which includes automated exposure control, adjustment of the mA and/or kV according to patient size and/or use of iterative reconstruction technique.  CONTRAST:  80mL OMNIPAQUE  IOHEXOL  350 MG/ML SOLN additional oral enteric contrast  COMPARISON:  02/07/2024  FINDINGS: CT CHEST FINDINGS  Cardiovascular: No significant vascular findings. Normal heart size. No pericardial effusion.  Mediastinum/Nodes: Similar size of enlarged pretracheal lymph node measuring 1.2 x 1.1 cm (series 2, image 23). Similar size of left supraclavicular lymph nodes measuring up to 1.8 x 1.0 cm (series 2, image 70). Thyroid  gland, trachea, and esophagus demonstrate no significant findings.  Lungs/Pleura: Slight enlargement of a nodule of the anterior right pulmonary apex measuring 1.5 x 1.2 cm, previously 1.2 x 1.0 cm (series 4, image 21). Additional small bilateral pulmonary nodules unchanged, largest in the right lower lobe measuring 0.6 cm (series 4, image 89). Bandlike scarring of the left lung base. No pleural effusion or pneumothorax.  Musculoskeletal: Pectus deformity.  No acute osseous findings.  CT ABDOMEN PELVIS FINDINGS  Hepatobiliary: No significant change in an elongated residual of a treated lesion of the anterior left lobe of the liver measuring 1.0 x 0.3 cm (series 2, image 61). No gallstones, gallbladder wall thickening, or biliary dilatation.  Pancreas: Unremarkable. No pancreatic ductal dilatation or surrounding inflammatory changes.  Spleen: Normal in size without significant abnormality.  Adrenals/Urinary Tract: Adrenal glands are unremarkable. Kidneys are normal, without renal calculi, solid  lesion, or hydronephrosis. Bladder is unremarkable.  Stomach/Bowel: Stomach is within normal limits. Right hemicolectomy and ileocolic anastomosis. Minimal enlargement of a soft tissue mass in the central small bowel mesentery with central calcification measuring 4.5 x 3.2 cm, previously 4.0 x 2.9 cm (series 2, image 74). No evidence of bowel wall thickening, distention, or inflammatory changes.  Vascular/Lymphatic: No significant vascular findings are present. Unchanged treated subcentimeter retroperitoneal lymph nodes (series 2, image 67).  Reproductive: No mass or other abnormality.  Other: No abdominal wall hernia or abnormality. No ascites.  Musculoskeletal: No acute osseous findings.  IMPRESSION: 1. Slight enlargement of a nodule of the anterior right pulmonary apex measuring 1.5 x 1.2 cm, previously 1.2 x 1.0 cm. Additional small bilateral pulmonary nodules unchanged. 2. Similar size of enlarged pretracheal pretracheal and left supraclavicular lymph nodes. 3. Minimal enlargement of a soft tissue mass in the central small bowel mesentery with central calcification measuring 4.5 x 3.2 cm, previously 4.0 x 2.9 cm. 4. No significant change in an elongated residual of a treated lesion of the anterior left lobe of the liver. 5. Unchanged treated subcentimeter retroperitoneal lymph nodes. 6. Constellation of findings is consistent with minimal interval progression of metastatic disease.  Electronically Signed   By: Marolyn JONETTA Jaksch M.D.   On: 05/08/2024 12:51

## 2024-05-15 NOTE — Patient Instructions (Addendum)
 [facility] at [facility] Discharge Instructions   You were seen and examined today by Dr. Davonna.  She reviewed the results of your lab work which are normal/stable.   She reviewed the results of your CT scan. It is showing minimal progression of the nodule in the lung and a minimal enlargement of a soft tissue mass in the central small bowel mesentery. Dr. Davonna has recommended getting radiation treatment to these two areas. We will make a referral to radiation oncology in [place] at [facility].   We will proceed with your treatment today.   Return as scheduled.    Thank you for choosing [facility] at [facility] to provide your oncology and hematology care.  To afford each patient quality time with our provider, please arrive at least 15 minutes before your scheduled appointment time.   If you have a lab appointment with the Cancer Center please come in thru the Main Entrance and check in at the main information desk.  You need to re-schedule your appointment should you arrive 10 or more minutes late.  We strive to give you quality time with our providers, and arriving late affects you and other patients whose appointments are after yours.  Also, if you no show three or more times for appointments you may be dismissed from the clinic at the providers discretion.     Again, thank you for choosing [facility].  Our hope is that these requests will decrease the amount of time that you wait before being seen by our physicians.       _____________________________________________________________  Should you have questions after your visit to [facility], please contact our office at [phone] and follow the prompts.  Our office hours are 8:00 a.m. and 4:30 p.m. Monday - Friday.  Please note that voicemails left after 4:00 p.m. may not be returned until the following business day.  We are closed weekends and major  holidays.  You do have access to a nurse 24-7, just call the main number to the clinic [phone] and do not press any options, hold on the line and a nurse will answer the phone.    For prescription refill requests, have your pharmacy contact our office and allow 72 hours.    Due to Covid, you will need to wear a mask upon entering the hospital. If you do not have a mask, a mask will be given to you at the Main Entrance upon arrival. For doctor visits, patients may have 1 support person age 5 or older with them. For treatment visits, patients can not have anyone with them due to social distancing guidelines and our immunocompromised population.

## 2024-05-16 LAB — CEA: CEA: 8.2 ng/mL — ABNORMAL HIGH (ref 0.0–4.7)

## 2024-05-19 ENCOUNTER — Telehealth: Payer: Self-pay | Admitting: Radiation Oncology

## 2024-05-19 NOTE — Telephone Encounter (Signed)
 11/10 @ 1:50 pm Left voicemail for patient to call our office to be sch for consult.

## 2024-05-21 NOTE — Progress Notes (Signed)
 Thoracic Location of Tumor / Histology: metastatic colon cancer to lungs  CT CAP 05/06/2024: Slight enlargement of a nodule of the anterior right pulmonary apex measuring 1.5 x 1.2 cm, previously 1.2 x 1.0 cm. Additional small bilateral pulmonary nodules unchanged. Similar size of enlarged pretracheal pretracheal and left supraclavicular lymph nodes. Minimal enlargement of a soft tissue mass in the central small bowel mesentery with central calcification measuring 4.5 x 3.2 cm, previously 4.0 x 2.9 cm.    Biopsies of   Past/Anticipated interventions by pulmonary, if any:  Past/Anticipated interventions by cardiothoracic surgery, if any:   Past/Anticipated interventions by medical oncology, if any:  Dr. Davonna 05/15/2024 -We reviewed the recent CT scan findings together.  Patient has increase in size of lung nodule along with soft tissue nodule.  Discussed various treatment options at this time including radiation to these 2 concerning spots and continuing Keytruda  versus changing treatments to either chemotherapy or BRAF inhibitors.  Patient at this time is considering radiation therapy.  Patient previously did not tolerate chemotherapy well. - Will continue Keytruda  every 6 weeks at this time.    Tobacco/Marijuana/Snuff/ETOH use: Former smoker.  Signs/Symptoms Weight changes, if any: Fluctuates. Respiratory complaints, if any: She reports some with increased activities. Hemoptysis, if any: Non productive, especially in the mornings. Pain issues, if any:  Denies chest pain, pressure, or tightness.  SAFETY ISSUES: Prior radiation? No Pacemaker/ICD?  No Possible current pregnancy? Postmenopausal Is the patient on methotrexate? No  Current Complaints / other details:   Port in place

## 2024-05-22 ENCOUNTER — Ambulatory Visit
Admission: RE | Admit: 2024-05-22 | Discharge: 2024-05-22 | Disposition: A | Discharge status: Home / Self Care | Source: Ambulatory Visit | Attending: Radiation Oncology | Admitting: Radiation Oncology

## 2024-05-22 ENCOUNTER — Encounter: Payer: Self-pay | Admitting: Radiation Oncology

## 2024-05-22 VITALS — BP 167/75 | HR 78 | Temp 97.3°F | Resp 18 | Ht 63.0 in | Wt 156.0 lb

## 2024-05-22 DIAGNOSIS — M129 Arthropathy, unspecified: Secondary | ICD-10-CM | POA: Diagnosis not present

## 2024-05-22 DIAGNOSIS — C3481 Malignant neoplasm of overlapping sites of right bronchus and lung: Secondary | ICD-10-CM

## 2024-05-22 DIAGNOSIS — Z87891 Personal history of nicotine dependence: Secondary | ICD-10-CM | POA: Diagnosis not present

## 2024-05-22 DIAGNOSIS — C189 Malignant neoplasm of colon, unspecified: Secondary | ICD-10-CM | POA: Insufficient documentation

## 2024-05-22 DIAGNOSIS — C787 Secondary malignant neoplasm of liver and intrahepatic bile duct: Secondary | ICD-10-CM | POA: Insufficient documentation

## 2024-05-22 DIAGNOSIS — C381 Malignant neoplasm of anterior mediastinum: Secondary | ICD-10-CM | POA: Diagnosis not present

## 2024-05-22 DIAGNOSIS — C7801 Secondary malignant neoplasm of right lung: Secondary | ICD-10-CM | POA: Insufficient documentation

## 2024-05-22 DIAGNOSIS — Z7982 Long term (current) use of aspirin: Secondary | ICD-10-CM | POA: Insufficient documentation

## 2024-05-22 DIAGNOSIS — Z79899 Other long term (current) drug therapy: Secondary | ICD-10-CM | POA: Insufficient documentation

## 2024-05-22 NOTE — Progress Notes (Signed)
 Radiation Oncology         [phone]) [phone] ________________________________  Name: Amanda Norris        MRN: [id number]  Date of Service: 05/22/2024 DOB: January 06, 1946  CC:Amanda Satterfield, MD  Davonna Siad, MD     REFERRING PHYSICIAN: Davonna Siad, MD   DIAGNOSIS: The primary encounter diagnosis was Malignant neoplasm of overlapping sites of right lung [facility]). A diagnosis of Metastatic colon cancer to liver [facility]) was also pertinent to this visit.   HISTORY OF PRESENT ILLNESS: Amanda Norris is a 78 y.o. female seen at the request of Dr. Davonna for a diagnosis of metastatic colon cancer. The patient was originally diagnosed with stage III colon cancer and underwent A right hemicolectomy with Dr. Mavis in June 2015.  She had free margins but 6 of 33 positive lymph nodes and completed 3 cycles of adjuvant FOLFOX which were discontinued because of intolerance.  Her cancer recurred in 2022 involving the liver lungs and mesenteric lymph nodes.  Her recurrence was confirmed pathologically after she underwent biopsy of a left supraclavicular lymph node that showed atypical columnar epithelium suspicious for recurrence of her cancer.  She began FOLFOX Avastin  and there was somewhat of a mixed response so her treatment was switched to immunotherapy with Keytruda  which she has remained on since 2022.  Recent CT chest abdomen and pelvis on 05/06/2024 have shown persistence in growth of a central small bowel mesentery nodule measuring 4.5 cm when in July 2025 and measured 4 cm.  In addition, a nodule in the apex of the right upper lobe measured 1.5 cm, previously 1.2 cm.  Given this change but otherwise stability and disease, the patient was offered the option to switch systemic therapy versus consider local options and is seen to consider a course of radiotherapy.    PREVIOUS RADIATION THERAPY: No   PAST MEDICAL HISTORY:  Past Medical History:  Diagnosis Date   Arthritis    wrist and thumbs   Colon  cancer (HCC)    PONV (postoperative nausea and vomiting)    Port-A-Cath in place 11/09/2020   Ruptured lumbar disc    L1-L5 per patient report   Sciatica of left side        PAST SURGICAL HISTORY: Past Surgical History:  Procedure Laterality Date   COLONOSCOPY N/A 12/12/2013   Procedure: COLONOSCOPY;  Surgeon: Claudis RAYMOND Rivet, MD;  [facility];  Service: Endoscopy;  Laterality: N/A;  730   COLONOSCOPY N/A 01/20/2015   Procedure: COLONOSCOPY;  Surgeon: Claudis RAYMOND Rivet, MD;  [facility];  Service: Endoscopy;  Laterality: N/A;  1030   COLONOSCOPY N/A 03/28/2018   Procedure: COLONOSCOPY;  Surgeon: Rivet Claudis RAYMOND, MD;  [facility];  Service: Endoscopy;  Laterality: N/A;  1015   EUS N/A 12/18/2013   Procedure: UPPER ENDOSCOPIC ULTRASOUND (EUS) LINEAR;  Surgeon: Toribio SHAUNNA Cedar, MD;  Location: [facility];  Service: Endoscopy;  Laterality: N/A;   herniated disc     1996-c5-c7   IR IMAGING GUIDED PORT INSERTION  10/14/2020   IR US  GUIDANCE  10/14/2020   PARTIAL COLECTOMY N/A 12/29/2013   Procedure: RIGHT HEMICOLECTOMY;  Surgeon: Oneil DELENA Mavis, MD;  Location: [facility];  Service: General;  Laterality: N/A;   POLYPECTOMY  03/28/2018   Procedure: POLYPECTOMY;  Surgeon: Rivet Claudis RAYMOND, MD;  [facility];  Service: Endoscopy;;  transverse colon (hot snare);   PORT-A-CATH REMOVAL Right 04/01/2014   Procedure: MINOR REMOVAL PORT-A-CATH;  Surgeon: Oneil DELENA Budge, MD;  Location: [facility];  Service: General;  Laterality: Right;   PORTACATH PLACEMENT Right 02/04/2014   Procedure: INSERTION PORT-A-CATH;  Surgeon: Oneil DELENA Budge, MD;  Location: [facility];  Service: General;  Laterality: Right;     FAMILY HISTORY:  Family History  Problem Relation Age of Onset   Diabetes type II Mother    Dementia Father      SOCIAL HISTORY:  reports that she quit smoking about 65 years ago. Her smoking use included cigarettes. She started smoking about 70 years ago. She has a 1.3  pack-year smoking history. She has never used smokeless tobacco. She reports that she does not drink alcohol and does not use drugs. The patient is married and accompanied by her son and daughter, and Norris. She is looking forward to upcoming time with family during the holiday season.    ALLERGIES: Sulfa antibiotics   MEDICATIONS:  Current Outpatient Medications  Medication Sig Dispense Refill   acetaminophen  (TYLENOL ) 500 MG tablet Take 500 mg by mouth every 6 (six) hours as needed for moderate pain or headache.     aspirin 81 MG EC tablet Take 1 tablet (81 mg total) by mouth daily. Swallow whole. 30 tablet 12   Bioflavonoid Products (ESTER C PO) Take 1,000 mg by mouth daily.     Cholecalciferol (D3-1000) 25 MCG (1000 UT) capsule Take 125 Units by mouth daily.     Cranberry-Vitamin C-Vitamin E 4200-20-3 MG-MG-UNIT CAPS Take 500 mg by mouth daily.     Garlic 1000 MG CAPS Take 1,000 mg by mouth daily.      lidocaine -prilocaine  (EMLA ) cream Apply small amount to port a cath site and cover with plastic wrap 1 hour prior to infusion appointments 30 g 3   Multiple Vitamin (MULTIVITAMIN) tablet Take 1 tablet by mouth daily.     OVER THE COUNTER MEDICATION Take 1,000 mg by mouth daily. Moringa 1000 mg daily     zinc gluconate 50 MG tablet Take 50 mg by mouth daily.     No current facility-administered medications for this encounter.   Facility-Administered Medications Ordered in Other Encounters  Medication Dose Route Frequency Provider Last Rate Last Admin   sodium chloride  flush (NS) 0.9 % injection 10 mL  10 mL Intracatheter PRN Rogers Hai, MD   10 mL at 01/10/24 1434     REVIEW OF SYSTEMS: On review of systems, the patient reports that she is doing okay. The patient's weight has fluctuated some and she states it has been stable recently in the last two months. She does have non productive cough especially in the mornings and occasional shortness of breath with increased  activities. No other complaints are verbalized.      PHYSICAL EXAM:  Wt Readings from Last 3 Encounters:  05/22/24 156 lb (70.8 kg)  05/15/24 158 lb 14.4 oz (72.1 kg)  04/03/24 159 lb 12.8 oz (72.5 kg)   Temp Readings from Last 3 Encounters:  05/22/24 (!) 97.3 F (36.3 C) (Temporal)  05/15/24 (!) 97.4 F (36.3 C) (Tympanic)  05/15/24 97.9 F (36.6 C) (Oral)   BP Readings from Last 3 Encounters:  05/22/24 (!) 167/75  05/15/24 (!) 158/68  05/15/24 (!) 148/56   Pulse Readings from Last 3 Encounters:  05/22/24 78  05/15/24 (!) 55  05/15/24 (!) 58   Pain Assessment Pain Score: 0-No pain/10  In general this is a well appearing caucasian female in no acute distress. She's alert and oriented x4 and  appropriate throughout the examination. Cardiopulmonary assessment is negative for acute distress and she exhibits normal effort.     ECOG = 1  0 - Asymptomatic (Fully active, able to carry on all predisease activities without restriction)  1 - Symptomatic but completely ambulatory (Restricted in physically strenuous activity but ambulatory and able to carry out work of a light or sedentary nature. For example, light housework, office work)  2 - Symptomatic, <50% in bed during the day (Ambulatory and capable of all self care but unable to carry out any work activities. Up and about more than 50% of waking hours)  3 - Symptomatic, >50% in bed, but not bedbound (Capable of only limited self-care, confined to bed or chair 50% or more of waking hours)  4 - Bedbound (Completely disabled. Cannot carry on any self-care. Totally confined to bed or chair)  5 - Death   Raylene MM, Creech RH, Tormey DC, et al. [phone]). Toxicity and response criteria of the [facility]. Am. DOROTHA Bridges. Oncol. 5 (6): 649-55    LABORATORY DATA:  Lab Results  Component Value Date   WBC 7.0 05/15/2024   HGB 13.1 05/15/2024   HCT 40.1 05/15/2024   MCV 92.4 05/15/2024   PLT 177  05/15/2024   Lab Results  Component Value Date   NA 138 05/15/2024   K 3.9 05/15/2024   CL 102 05/15/2024   CO2 26 05/15/2024   Lab Results  Component Value Date   ALT 17 05/15/2024   AST 27 05/15/2024   ALKPHOS 72 05/15/2024   BILITOT 0.4 05/15/2024      RADIOGRAPHY: CT CHEST ABDOMEN PELVIS W CONTRAST Result Date: 05/08/2024 CLINICAL DATA:  Metastatic colon cancer, assess treatment response * Tracking Code: BO * EXAM: CT CHEST, ABDOMEN, AND PELVIS WITH CONTRAST TECHNIQUE: Multidetector CT imaging of the chest, abdomen and pelvis was performed following the standard protocol during bolus administration of intravenous contrast. RADIATION DOSE REDUCTION: This exam was performed according to the departmental dose-optimization program which includes automated exposure control, adjustment of the mA and/or kV according to patient size and/or use of iterative reconstruction technique. CONTRAST:  80mL OMNIPAQUE  IOHEXOL  350 MG/ML SOLN additional oral enteric contrast COMPARISON:  02/07/2024 FINDINGS: CT CHEST FINDINGS Cardiovascular: No significant vascular findings. Normal heart size. No pericardial effusion. Mediastinum/Nodes: Similar size of enlarged pretracheal lymph node measuring 1.2 x 1.1 cm (series 2, image 23). Similar size of left supraclavicular lymph nodes measuring up to 1.8 x 1.0 cm (series 2, image 70). Thyroid  gland, trachea, and esophagus demonstrate no significant findings. Lungs/Pleura: Slight enlargement of a nodule of the anterior right pulmonary apex measuring 1.5 x 1.2 cm, previously 1.2 x 1.0 cm (series 4, image 21). Additional small bilateral pulmonary nodules unchanged, largest in the right lower lobe measuring 0.6 cm (series 4, image 89). Bandlike scarring of the left lung base. No pleural effusion or pneumothorax. Musculoskeletal: Pectus deformity.  No acute osseous findings. CT ABDOMEN PELVIS FINDINGS Hepatobiliary: No significant change in an elongated residual of a treated  lesion of the anterior left lobe of the liver measuring 1.0 x 0.3 cm (series 2, image 61). No gallstones, gallbladder wall thickening, or biliary dilatation. Pancreas: Unremarkable. No pancreatic ductal dilatation or surrounding inflammatory changes. Spleen: Normal in size without significant abnormality. Adrenals/Urinary Tract: Adrenal glands are unremarkable. Kidneys are normal, without renal calculi, solid lesion, or hydronephrosis. Bladder is unremarkable. Stomach/Bowel: Stomach is within normal limits. Right hemicolectomy and ileocolic anastomosis. Minimal enlargement of a soft tissue  mass in the central small bowel mesentery with central calcification measuring 4.5 x 3.2 cm, previously 4.0 x 2.9 cm (series 2, image 74). No evidence of bowel wall thickening, distention, or inflammatory changes. Vascular/Lymphatic: No significant vascular findings are present. Unchanged treated subcentimeter retroperitoneal lymph nodes (series 2, image 67). Reproductive: No mass or other abnormality. Other: No abdominal wall hernia or abnormality. No ascites. Musculoskeletal: No acute osseous findings. IMPRESSION: 1. Slight enlargement of a nodule of the anterior right pulmonary apex measuring 1.5 x 1.2 cm, previously 1.2 x 1.0 cm. Additional small bilateral pulmonary nodules unchanged. 2. Similar size of enlarged pretracheal pretracheal and left supraclavicular lymph nodes. 3. Minimal enlargement of a soft tissue mass in the central small bowel mesentery with central calcification measuring 4.5 x 3.2 cm, previously 4.0 x 2.9 cm. 4. No significant change in an elongated residual of a treated lesion of the anterior left lobe of the liver. 5. Unchanged treated subcentimeter retroperitoneal lymph nodes. 6. Constellation of findings is consistent with minimal interval progression of metastatic disease. Electronically Signed   By: Marolyn JONETTA Jaksch M.D.   On: 05/08/2024 12:51       IMPRESSION/PLAN: 1. Recurrent metastatic  adenocarcinoma of the colon. Dr. Dewey discusses the pathology findings and reviews the nature of metastatic lung disease. He discusses the rationale to consider a course of stereotactic body radiotherapy (SBRT) to the apical RUL nodule. The mesenteric nodule has waxed and waned over time but has been increasing in size from recent imaging. The location is not feasible to offer stereotactic radiotherapy, and we will let Dr. Davonna know about his. Regarding the RUL nodule however,  we discussed the risks, benefits, short, and long term effects of radiotherapy, as well as the locally definitive intent though overall palliative intent, and the patient is interested in proceeding. Dr. Dewey discusses the delivery and logistics of radiotherapy and anticipates a course of 3-5 fractions of radiotherapy. We will coordinate to start the week of June 09, 2024 and would defer the timing of her next immunotherapy cycle to Dr. Davonna.    In a visit lasting 60 minutes, greater than 50% of the time was spent face to face discussing the patient's condition, in preparation for the discussion, and coordinating the patient's care.   The above documentation reflects my direct findings during this shared patient visit. Please see the separate note by Dr. Dewey on this date for the remainder of the patient's plan of care.    Amanda Norris, [facility]   **Disclaimer: This note was dictated with voice recognition software. Similar sounding words can inadvertently be transcribed and this note may contain transcription errors which may not have been corrected upon publication of note.**

## 2024-05-23 DIAGNOSIS — C381 Malignant neoplasm of anterior mediastinum: Secondary | ICD-10-CM | POA: Diagnosis not present

## 2024-05-23 DIAGNOSIS — Z87891 Personal history of nicotine dependence: Secondary | ICD-10-CM | POA: Diagnosis not present

## 2024-05-29 ENCOUNTER — Ambulatory Visit
Admission: RE | Admit: 2024-05-29 | Discharge: 2024-05-29 | Disposition: A | Discharge status: Home / Self Care | Source: Ambulatory Visit | Attending: Radiation Oncology | Admitting: Radiation Oncology

## 2024-05-29 DIAGNOSIS — C381 Malignant neoplasm of anterior mediastinum: Secondary | ICD-10-CM | POA: Diagnosis not present

## 2024-05-29 DIAGNOSIS — C3412 Malignant neoplasm of upper lobe, left bronchus or lung: Secondary | ICD-10-CM | POA: Insufficient documentation

## 2024-05-29 DIAGNOSIS — Z87891 Personal history of nicotine dependence: Secondary | ICD-10-CM | POA: Diagnosis not present

## 2024-05-29 DIAGNOSIS — C787 Secondary malignant neoplasm of liver and intrahepatic bile duct: Secondary | ICD-10-CM | POA: Diagnosis not present

## 2024-05-30 DIAGNOSIS — Z87891 Personal history of nicotine dependence: Secondary | ICD-10-CM | POA: Diagnosis not present

## 2024-05-30 DIAGNOSIS — C787 Secondary malignant neoplasm of liver and intrahepatic bile duct: Secondary | ICD-10-CM | POA: Diagnosis not present

## 2024-05-30 DIAGNOSIS — C3412 Malignant neoplasm of upper lobe, left bronchus or lung: Secondary | ICD-10-CM | POA: Diagnosis not present

## 2024-05-30 DIAGNOSIS — C381 Malignant neoplasm of anterior mediastinum: Secondary | ICD-10-CM | POA: Diagnosis not present

## 2024-06-11 ENCOUNTER — Other Ambulatory Visit: Payer: Self-pay

## 2024-06-11 ENCOUNTER — Ambulatory Visit
Admission: RE | Admit: 2024-06-11 | Discharge: 2024-06-11 | Disposition: A | Source: Ambulatory Visit | Attending: Radiation Oncology | Admitting: Radiation Oncology

## 2024-06-11 DIAGNOSIS — C3412 Malignant neoplasm of upper lobe, left bronchus or lung: Secondary | ICD-10-CM | POA: Diagnosis present

## 2024-06-11 DIAGNOSIS — C787 Secondary malignant neoplasm of liver and intrahepatic bile duct: Secondary | ICD-10-CM | POA: Diagnosis present

## 2024-06-11 LAB — RAD ONC ARIA SESSION SUMMARY
Course Elapsed Days: 0
Plan Fractions Treated to Date: 1
Plan Prescribed Dose Per Fraction: 12 Gy
Plan Total Fractions Prescribed: 5
Plan Total Prescribed Dose: 60 Gy
Reference Point Dosage Given to Date: 12 Gy
Reference Point Session Dosage Given: 12 Gy
Session Number: 1

## 2024-06-13 ENCOUNTER — Ambulatory Visit
Admission: RE | Admit: 2024-06-13 | Discharge: 2024-06-13 | Disposition: A | Source: Ambulatory Visit | Attending: Radiation Oncology | Admitting: Radiation Oncology

## 2024-06-13 ENCOUNTER — Other Ambulatory Visit: Payer: Self-pay

## 2024-06-13 DIAGNOSIS — C3412 Malignant neoplasm of upper lobe, left bronchus or lung: Secondary | ICD-10-CM | POA: Diagnosis not present

## 2024-06-13 LAB — RAD ONC ARIA SESSION SUMMARY
Course Elapsed Days: 2
Plan Fractions Treated to Date: 2
Plan Prescribed Dose Per Fraction: 12 Gy
Plan Total Fractions Prescribed: 5
Plan Total Prescribed Dose: 60 Gy
Reference Point Dosage Given to Date: 24 Gy
Reference Point Session Dosage Given: 12 Gy
Session Number: 2

## 2024-06-16 ENCOUNTER — Other Ambulatory Visit: Payer: Self-pay

## 2024-06-16 ENCOUNTER — Ambulatory Visit
Admission: RE | Admit: 2024-06-16 | Discharge: 2024-06-16 | Disposition: A | Source: Ambulatory Visit | Attending: Radiation Oncology | Admitting: Radiation Oncology

## 2024-06-16 DIAGNOSIS — C3412 Malignant neoplasm of upper lobe, left bronchus or lung: Secondary | ICD-10-CM | POA: Diagnosis not present

## 2024-06-16 LAB — RAD ONC ARIA SESSION SUMMARY
Course Elapsed Days: 5
Plan Fractions Treated to Date: 3
Plan Prescribed Dose Per Fraction: 12 Gy
Plan Total Fractions Prescribed: 5
Plan Total Prescribed Dose: 60 Gy
Reference Point Dosage Given to Date: 36 Gy
Reference Point Session Dosage Given: 12 Gy
Session Number: 3

## 2024-06-17 ENCOUNTER — Ambulatory Visit: Admitting: Radiation Oncology

## 2024-06-18 ENCOUNTER — Other Ambulatory Visit: Payer: Self-pay

## 2024-06-18 ENCOUNTER — Ambulatory Visit

## 2024-06-18 ENCOUNTER — Ambulatory Visit
Admission: RE | Admit: 2024-06-18 | Discharge: 2024-06-18 | Disposition: A | Discharge status: Home / Self Care | Source: Ambulatory Visit | Attending: Radiation Oncology | Admitting: Radiation Oncology

## 2024-06-18 DIAGNOSIS — C3412 Malignant neoplasm of upper lobe, left bronchus or lung: Secondary | ICD-10-CM | POA: Diagnosis not present

## 2024-06-18 LAB — RAD ONC ARIA SESSION SUMMARY
Course Elapsed Days: 7
Plan Fractions Treated to Date: 4
Plan Prescribed Dose Per Fraction: 12 Gy
Plan Total Fractions Prescribed: 5
Plan Total Prescribed Dose: 60 Gy
Reference Point Dosage Given to Date: 48 Gy
Reference Point Session Dosage Given: 12 Gy
Session Number: 4

## 2024-06-19 ENCOUNTER — Other Ambulatory Visit

## 2024-06-19 ENCOUNTER — Ambulatory Visit: Admitting: Radiation Oncology

## 2024-06-19 ENCOUNTER — Ambulatory Visit

## 2024-06-20 ENCOUNTER — Ambulatory Visit: Admission: RE | Admit: 2024-06-20 | Discharge: 2024-06-20 | Attending: Radiation Oncology

## 2024-06-20 ENCOUNTER — Ambulatory Visit

## 2024-06-20 ENCOUNTER — Other Ambulatory Visit: Payer: Self-pay

## 2024-06-20 ENCOUNTER — Ambulatory Visit
Admission: RE | Admit: 2024-06-20 | Discharge: 2024-06-20 | Disposition: A | Source: Ambulatory Visit | Attending: Radiation Oncology | Admitting: Radiation Oncology

## 2024-06-20 DIAGNOSIS — Z87891 Personal history of nicotine dependence: Secondary | ICD-10-CM | POA: Diagnosis not present

## 2024-06-20 DIAGNOSIS — C381 Malignant neoplasm of anterior mediastinum: Secondary | ICD-10-CM | POA: Diagnosis not present

## 2024-06-20 DIAGNOSIS — Z510 Encounter for antineoplastic radiation therapy: Secondary | ICD-10-CM | POA: Diagnosis not present

## 2024-06-20 DIAGNOSIS — C3412 Malignant neoplasm of upper lobe, left bronchus or lung: Secondary | ICD-10-CM | POA: Diagnosis not present

## 2024-06-20 LAB — RAD ONC ARIA SESSION SUMMARY
Course Elapsed Days: 9
Plan Fractions Treated to Date: 5
Plan Prescribed Dose Per Fraction: 12 Gy
Plan Total Fractions Prescribed: 5
Plan Total Prescribed Dose: 60 Gy
Reference Point Dosage Given to Date: 60 Gy
Reference Point Session Dosage Given: 12 Gy
Session Number: 5

## 2024-06-23 ENCOUNTER — Ambulatory Visit: Admitting: Radiation Oncology

## 2024-06-23 NOTE — Radiation Completion Notes (Addendum)
" °  Radiation Oncology         [phone] ________________________________  Name: Amanda Norris MRN: [id number]  Date of Service: 06/20/2024  DOB: [birth year]  End of Treatment Note  Diagnosis:  Recurrent metastatic adenocarcinoma of the colon.   Intent: Curative     ==========DELIVERED PLANS==========  First Treatment Date: 2024-06-11 Last Treatment Date: 2024-06-20   Plan Name: Lung_R Site: Lung, RUL Technique: SBRT/SRT-IMRT Mode: Photon Dose Per Fraction: 12 Gy Prescribed Dose (Delivered / Prescribed): 60 Gy / 60 Gy Prescribed Fxs (Delivered / Prescribed): 5 / 5     ==========ON TREATMENT VISIT DATES========== 2024-06-11, 2024-06-13, 2024-06-16, 2024-06-18, 2024-06-20, 2024-06-20     See weekly On Treatment Notes in Epic for details in the Media tab (listed as Progress notes on the On Treatment Visit Dates listed above). The patient tolerated radiation. She developed fatigue and anticipated skin changes in the treatment field.   The patient will receive a call in about one month from the radiation oncology department. She will continue follow up with Dr. Davonna as well.      Donald KYM Husband, PAC     "

## 2024-06-25 ENCOUNTER — Ambulatory Visit: Admitting: Radiation Oncology

## 2024-06-26 ENCOUNTER — Inpatient Hospital Stay

## 2024-06-26 ENCOUNTER — Inpatient Hospital Stay: Attending: Hematology

## 2024-06-26 ENCOUNTER — Inpatient Hospital Stay: Attending: Hematology | Admitting: Oncology

## 2024-06-26 VITALS — BP 138/57 | HR 58 | Temp 98.1°F | Resp 16

## 2024-06-26 DIAGNOSIS — Z79620 Long term (current) use of immunosuppressive biologic: Secondary | ICD-10-CM | POA: Diagnosis not present

## 2024-06-26 DIAGNOSIS — C189 Malignant neoplasm of colon, unspecified: Secondary | ICD-10-CM | POA: Diagnosis not present

## 2024-06-26 DIAGNOSIS — C787 Secondary malignant neoplasm of liver and intrahepatic bile duct: Secondary | ICD-10-CM

## 2024-06-26 DIAGNOSIS — C184 Malignant neoplasm of transverse colon: Secondary | ICD-10-CM | POA: Insufficient documentation

## 2024-06-26 DIAGNOSIS — C778 Secondary and unspecified malignant neoplasm of lymph nodes of multiple regions: Secondary | ICD-10-CM | POA: Insufficient documentation

## 2024-06-26 DIAGNOSIS — Z95828 Presence of other vascular implants and grafts: Secondary | ICD-10-CM

## 2024-06-26 DIAGNOSIS — C7800 Secondary malignant neoplasm of unspecified lung: Secondary | ICD-10-CM | POA: Diagnosis not present

## 2024-06-26 DIAGNOSIS — Z5112 Encounter for antineoplastic immunotherapy: Secondary | ICD-10-CM | POA: Insufficient documentation

## 2024-06-26 DIAGNOSIS — K529 Noninfective gastroenteritis and colitis, unspecified: Secondary | ICD-10-CM | POA: Diagnosis not present

## 2024-06-26 LAB — TSH: TSH: 1.17 u[IU]/mL (ref 0.350–4.500)

## 2024-06-26 LAB — CBC WITH DIFFERENTIAL/PLATELET
Abs Immature Granulocytes: 0.02 K/uL (ref 0.00–0.07)
Basophils Absolute: 0 K/uL (ref 0.0–0.1)
Basophils Relative: 0 %
Eosinophils Absolute: 0.1 K/uL (ref 0.0–0.5)
Eosinophils Relative: 2 %
HCT: 38.6 % (ref 36.0–46.0)
Hemoglobin: 12.7 g/dL (ref 12.0–15.0)
Immature Granulocytes: 0 %
Lymphocytes Relative: 15 %
Lymphs Abs: 0.8 K/uL (ref 0.7–4.0)
MCH: 30.5 pg (ref 26.0–34.0)
MCHC: 32.9 g/dL (ref 30.0–36.0)
MCV: 92.6 fL (ref 80.0–100.0)
Monocytes Absolute: 0.7 K/uL (ref 0.1–1.0)
Monocytes Relative: 12 %
Neutro Abs: 3.9 K/uL (ref 1.7–7.7)
Neutrophils Relative %: 71 %
Platelets: 177 K/uL (ref 150–400)
RBC: 4.17 MIL/uL (ref 3.87–5.11)
RDW: 13.3 % (ref 11.5–15.5)
WBC: 5.5 K/uL (ref 4.0–10.5)
nRBC: 0 % (ref 0.0–0.2)

## 2024-06-26 LAB — COMPREHENSIVE METABOLIC PANEL WITH GFR
ALT: 20 U/L (ref 0–44)
AST: 27 U/L (ref 15–41)
Albumin: 4.3 g/dL (ref 3.5–5.0)
Alkaline Phosphatase: 71 U/L (ref 38–126)
Anion gap: 13 (ref 5–15)
BUN: 17 mg/dL (ref 8–23)
CO2: 22 mmol/L (ref 22–32)
Calcium: 9.5 mg/dL (ref 8.9–10.3)
Chloride: 103 mmol/L (ref 98–111)
Creatinine, Ser: 0.51 mg/dL (ref 0.44–1.00)
GFR, Estimated: >60 mL/min (ref >60)
Glucose, Bld: 99 mg/dL (ref 70–99)
Potassium: 3.9 mmol/L (ref 3.5–5.1)
Sodium: 138 mmol/L (ref 135–145)
Total Bilirubin: 0.3 mg/dL (ref 0.0–1.2)
Total Protein: 7.8 g/dL (ref 6.5–8.1)

## 2024-06-26 LAB — MAGNESIUM: Magnesium: 2.3 mg/dL (ref 1.7–2.4)

## 2024-06-26 MED ORDER — SODIUM CHLORIDE 0.9 % IV SOLN: Freq: Once | INTRAVENOUS | Status: AC

## 2024-06-26 MED ORDER — SODIUM CHLORIDE 0.9 % IV SOLN
400.0000 mg | Freq: Once | INTRAVENOUS | Status: AC
  Administered 2024-06-26: 15:00:00 400 mg via INTRAVENOUS
  Filled 2024-06-26: qty 16

## 2024-06-26 NOTE — Patient Instructions (Signed)
 [facility] [place] - A DEPT OF . [facility]  Discharge Instructions: Thank you for choosing [facility] to provide your oncology and hematology care.  If you have a lab appointment with the Cancer Center - please note that after April 8th, 2024, all labs will be drawn in the cancer center.  You do not have to check in or register with the main entrance as you have in the past but will complete your check-in in the cancer center.  Wear comfortable clothing and clothing appropriate for easy access to any Portacath or PICC line.   We strive to give you quality time with your provider. You may need to reschedule your appointment if you arrive late (15 or more minutes).  Arriving late affects you and other patients whose appointments are after yours.  Also, if you miss three or more appointments without notifying the office, you may be dismissed from the clinic at the provider's discretion.      For prescription refill requests, have your pharmacy contact our office and allow 72 hours for refills to be completed.    Today you received the following chemotherapy and/or immunotherapy agents Pembrolizumab    To help prevent nausea and vomiting after your treatment, we encourage you to take your nausea medication as directed.  BELOW ARE SYMPTOMS THAT SHOULD BE REPORTED IMMEDIATELY: *FEVER GREATER THAN 100.4 F (38 C) OR HIGHER *CHILLS OR SWEATING *NAUSEA AND VOMITING THAT IS NOT CONTROLLED WITH YOUR NAUSEA MEDICATION *UNUSUAL SHORTNESS OF BREATH *UNUSUAL BRUISING OR BLEEDING *URINARY PROBLEMS (pain or burning when urinating, or frequent urination) *BOWEL PROBLEMS (unusual diarrhea, constipation, pain near the anus) TENDERNESS IN MOUTH AND THROAT WITH OR WITHOUT PRESENCE OF ULCERS (sore throat, sores in mouth, or a toothache) UNUSUAL RASH, SWELLING OR PAIN  UNUSUAL VAGINAL DISCHARGE OR ITCHING   Items with * indicate a potential emergency and should be followed  up as soon as possible or go to the Emergency Department if any problems should occur.  Please show the CHEMOTHERAPY ALERT CARD or IMMUNOTHERAPY ALERT CARD at check-in to the Emergency Department and triage nurse.  Should you have questions after your visit or need to cancel or reschedule your appointment, please contact [facility] [place] - A DEPT OF [facility] [phone]  and follow the prompts.  Office hours are 8:00 a.m. to 4:30 p.m. Monday - Friday. Please note that voicemails left after 4:00 p.m. may not be returned until the following business day.  We are closed weekends and major holidays. You have access to a nurse at all times for urgent questions. Please call the main number to the clinic [phone] and follow the prompts.  For any non-urgent questions, you may also contact your provider using MyChart. We now offer e-Visits for anyone 23 and older to request care online for non-urgent symptoms. For details visit [email or web address].   Also download the MyChart app! Go to the app store, search MyChart, open the app, select [place], and log in with your MyChart username and password.

## 2024-06-26 NOTE — Patient Instructions (Signed)

## 2024-06-26 NOTE — Progress Notes (Signed)
 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 female with metastatic colon carcinoma  Metastatic colon cancer to liver Metastatic colon carcinoma, metastatic to liver, lung.  Initially diagnosed in 2015 Extensive oncology history below History patient has a TMB high, currently is on Keytruda  Caris NGS below, also showed BRAF V 600E mutation   - Labs reviewed today: CMP: WNL, CBC: WNL, TSH: Normal, CEA: 8.2 - Physical exam stable today.  Proceed with treatment today.  Tolerating well with no complications. -We reviewed the recent CT scan findings together.  Patient has increase in size of lung nodule along with soft tissue nodule.  Discussed various treatment options at this time including radiation to these 2 concerning spots and continuing Keytruda  versus changing treatments to either chemotherapy or BRAF inhibitors.  Patient at this time is considering radiation therapy.  Patient previously did not tolerate chemotherapy well. - Will continue Keytruda  every 6 weeks at this time. - Repeat CEA with every cycle.  CEA from today pending.   Return to clinic in 6 weeks after evaluation by radiation oncology.  Chronic diarrhea She has baseline diarrhea since surgery.   - Continue Imodium  as needed.  She is averaging 1-2 Imodium  tablets daily.    The patient understands the plans discussed today and is in agreement with them.  She knows to contact our office if she develops concerns prior to her next appointment.  The total time spent in the appointment was *** minutes for the encounter with patient, including review of chart and various tests  results, discussions about plan of care and coordination of care plan   I,Helena R Teague,acting as a scribe for Mickiel Dry, MD.,have documented all relevant documentation on the behalf of Mickiel Dry, MD,as directed by  Mickiel Dry, MD while in the presence of Mickiel Dry, MD.  ***   Amanda Norris  [facility] [place] - A DEPT OF [facility] [address] [place] [address] Dept: [phone] Dept Fax: [phone]   No orders of the defined types were placed in this encounter.    ONCOLOGY HISTORY:   I have reviewed her chart and materials related to her cancer extensively and collaborated history with the patient. Summary of oncologic history is as follows:   Diagnosis: Metastatic adenocarcinoma the proximal transverse colon, BRAF V600E+, TMB high   -CT AP: 12/02/2013: CT AP: Area concerning for neoplasm in the proximal transverse colon. In this area, there is focal wall thickening with what appears to be some mucosal disruption. This area measures 6.2 x 4.3 cm in length. There is some subtle mesenteric thickening in this area; spread of tumor to the immediate adjacent mesenteric region is suspected. Small nodular lesions in the lung bases. Questionable mass versus inflammation in the head of the pancreas.  -12/05/2013: MRI abdomen: 18 x 9 mm cystic lesion in the posterior pancreatic head appears to communicate with the main pancreatic duct. Side branch IPMN would be a distinct consideration. Immediately anterior to this nonenhancing cystic lesion is a 10 x 23 mm subtle area of altered signal and hypo enhancement within the  pancreatic head.  -12/11/2013: CEA 3.8.  -12/12/2013: Colonoscopy: Proximal transverse colon mass, biopsied.  Pathology: Invasive adenocarcinoma.  -12/29/2013: Right hemicolectomy. Pathology: Grade 2 invasive colorectal adenocarcinoma, 7 cm, extending into pericolonic tissue.  Metastatic carcinoma in 6 of 33 lymph nodes. Margins not involved. pT3, pN2a, MX -12/31/2013: CT chest: Multiple small bilateral pulmonary nodules may represent benign granulomatous disease. Perihepatic ascites and some free intraperitoneal gas, consistent with recent surgical history.  -02/10/2014-03/10/2014: 3 cycles of FOLFOX completed, discontinued secondary to intolerance -03/10/2014: DPD gene mutation negative -12/10/2014: CT AP: Status post right hemicolectomy with 2 new adjacent soft tissue nodules in the small bowel mesenteric worrisome for recurrent disease. -02/01/2015: Initial PET: Mild abdominal mesenteric lymphadenopathy shows hypermetabolic activity, consistent with metastatic disease. No other definite sites of metastatic disease identified. 7 mm indeterminate pulmonary nodule in the superior segment of the right lower lobe shows no associated metabolic activity, but remains too small to accurately characterize by PET.  -03/24/2015: PET: Mild progression of nodal metastases in the anterior mid abdominal mesentery. New nodal metastasis at the left thoracic inlet. -06/22/2015: Resolution of metabolic activity and decreased size of mild lymphadenopathy at left thoracic inlet. Stable mild mid abdominal hypermetabolic mesenteric lymphadenopathy, consistent with metastatic disease. Stable 6 mm posterior left lower lobe pulmonary nodule, without associated metabolic activity. No new or progressive disease identified. -2017-09/20/2020: Patient was under surveillance with imaging showing stable disease. -03/28/2018: Colonoscopy: One 8 mm polyp in the transverse colon, resected. Diverticulosis in the sigmoid colon, descending colon, splenic flexure, and transverse colon.  Pathology: Sessile serrated polyp without cytologic dysplasia.  -10/28/2018: CEA: 4.6 -09/17/2020: CEA: 21.4 -09/21/2020: CT AP: A soft tissue nodule within the central mesentery in the ventral abdomen has substantially increased  in size, now encasing the proximal superior mesenteric artery and measuring 5.0 x 4.3 cm, previously 1.9 x 1.7 cm. Numerous new small pulmonary nodules throughout the included lung bases, measuring up to 5 mm. There is a new hypodense lesion of the anterior right lobe of the liver measuring 1.4 x 1.0 cm. Numerous newly enlarged retrocrural and periaortic lymph nodes in the included lower chest as well as numerous newly enlarged retroperitoneal lymph nodes. -09/27/2020: CT chest: Findings of metastatic disease in the chest with LEFT thoracic inlet lymph node, mediastinal lymph nodes and multiple pulmonary nodules in addition to abdominal findings which were outlined in the recent comparison study. -09/28/2020: Mesenteric lymph node biopsy.   Pathology: Mucin and fibrosis  -10/14/2020: Left supraclavicular lymph node biopsy.  Pathology: Soft tissue, mucin, and rare fragments of atypical columnar epithelium. Findings likely represent metastatic colorectal adenocarcinoma. There is likely  insufficient material for additional studies.  -11/09/2020: Caris: BRAF V600 E+, MMR deficient, MSI high, TMB high,45mut/Mb HER2 negative, BRCA1/2 pathogenic variant on exon 14 and exon 9 respectively.  The report also suggested decreased benefit to FOLFOX and bevacizumab  in the first-line metastatic setting.  -11/09/2020: Liver biopsy:  Pathology: Adenocarcinoma with abundant mucin consistent with metastatic mucinous adenocarcinoma. Tumor cells are positive with CDX2 and cytokeratin 20. -11/10/2020-12/08/2020: 3 cycles of FOLFIRI and reduced bevacizumab  completed -01/13/2021-current: Keytruda , transitioned to every 6 weeks on 03/22/2023 -2022-01/2024: Patient's imaging has showed stable to improved metastatic disease - 05/06/2024: CT CAP: Slight enlargement of a nodule of the anterior right pulmonary apex measuring 1.5 x 1.2 cm, previously 1.2 x 1.0 cm. Additional small bilateral pulmonary nodules unchanged. Similar size  of enlarged pretracheal pretracheal and left supraclavicular lymph nodes. Minimal enlargement of a soft tissue mass in the central small  bowel mesentery with central calcification measuring 4.5 x 3.2 cm, previously 4.0 x 2.9 cm. No significant change in an elongated residual of a treated lesion of the anterior left lobe of the liver. Unchanged treated subcentimeter retroperitoneal lymph nodes. -06/11/2024 - 06/20/2024: SBRT to right lung by Dr. Dewey   Current Treatment:  Keytruda  400mg  every 6 weeks  INTERVAL HISTORY:   Amanda Norris is a 78 y.o. female presenting to the clinic today for follow-up of metastatic colon cancer. Patient is accompanied by 2 family members today.   She states radiation went well and is feeling well overall. Her diarrhea is stable and requires 1 pill of imodium  a day.   We discussed lab results, which were stable, though CEA was pending.   I have reviewed the past medical history, past surgical history, social history and family history with the patient and they are unchanged from previous note.  ALLERGIES:  is allergic to sulfa antibiotics.  MEDICATIONS:  Current Outpatient Medications  Medication Sig Dispense Refill   acetaminophen  (TYLENOL ) 500 MG tablet Take 500 mg by mouth every 6 (six) hours as needed for moderate pain or headache.     aspirin 81 MG EC tablet Take 1 tablet (81 mg total) by mouth daily. Swallow whole. 30 tablet 12   Bioflavonoid Products (ESTER C PO) Take 1,000 mg by mouth daily.     Cholecalciferol (D3-1000) 25 MCG (1000 UT) capsule Take 125 Units by mouth daily.     Cranberry-Vitamin C-Vitamin E 4200-20-3 MG-MG-UNIT CAPS Take 500 mg by mouth daily.     Garlic 1000 MG CAPS Take 1,000 mg by mouth daily.      lidocaine -prilocaine  (EMLA ) cream Apply small amount to port a cath site and cover with plastic wrap 1 hour prior to infusion appointments 30 g 3   Multiple Vitamin (MULTIVITAMIN) tablet Take 1 tablet by mouth daily.     OVER THE  COUNTER MEDICATION Take 1,000 mg by mouth daily. Moringa 1000 mg daily     zinc gluconate 50 MG tablet Take 50 mg by mouth daily.     No current facility-administered medications for this visit.   Facility-Administered Medications Ordered in Other Visits  Medication Dose Route Frequency Provider Last Rate Last Admin   sodium chloride  flush (NS) 0.9 % injection 10 mL  10 mL Intracatheter PRN Rogers Hai, MD   10 mL at 01/10/24 1434    REVIEW OF SYSTEMS:   Constitutional: Denies fevers, chills or abnormal weight loss Eyes: Denies blurriness of vision Ears, nose, mouth, throat, and face: Denies mucositis or sore throat Respiratory: Denies cough, dyspnea or wheezes Cardiovascular: Denies palpitation, chest discomfort or lower extremity swelling Gastrointestinal:  Denies nausea, heartburn or change in bowel habits Skin: Denies abnormal skin rashes Lymphatics: Denies new lymphadenopathy or easy bruising Neurological:Denies numbness, tingling or new weaknesses Behavioral/Psych: Mood is stable, no new changes  All other systems were reviewed with the patient and are negative.   VITALS:  There were no vitals taken for this visit.  Wt Readings from Last 3 Encounters:  05/22/24 156 lb (70.8 kg)  05/15/24 158 lb 14.4 oz (72.1 kg)  04/03/24 159 lb 12.8 oz (72.5 kg)    There is no height or weight on file to calculate BMI.  Performance status (ECOG): 2 - Symptomatic, <50% confined to bed  PHYSICAL EXAM:   GENERAL:alert, no distress and comfortable SKIN: skin color, texture, turgor are normal, no rashes or significant lesions EYES: normal, Conjunctiva are pink and  non-injected, sclera clear OROPHARYNX:no exudate, no erythema and lips, buccal mucosa, and tongue normal  NECK: supple, thyroid  normal size, non-tender, without nodularity LYMPH:  no palpable lymphadenopathy in the cervical, axillary or inguinal LUNGS: No abnormal breath sounds.  Normal breathing effort.  HEART:  regular rate & rhythm and no murmurs and no lower extremity edema ABDOMEN:abdomen soft, non-tender and normal bowel sounds Musculoskeletal:no cyanosis of digits and no clubbing  NEURO: alert & oriented x 3 with fluent speech   LABORATORY DATA:  I have reviewed the data as listed  Lab Results  Component Value Date   WBC 7.0 05/15/2024   NEUTROABS 4.9 05/15/2024   HGB 13.1 05/15/2024   HCT 40.1 05/15/2024   MCV 92.4 05/15/2024   PLT 177 05/15/2024      Chemistry      Component Value Date/Time   NA 138 05/15/2024 0929   K 3.9 05/15/2024 0929   CL 102 05/15/2024 0929   CO2 26 05/15/2024 0929   BUN 13 05/15/2024 0929   CREATININE 0.64 05/15/2024 0929   CREATININE 0.68 08/26/2020 0944      Component Value Date/Time   CALCIUM  9.2 05/15/2024 0929   ALKPHOS 72 05/15/2024 0929   AST 27 05/15/2024 0929   ALT 17 05/15/2024 0929   BILITOT 0.4 05/15/2024 0929      Latest Reference Range & Units 04/03/24 08:45  TSH 0.350 - 4.500 uIU/mL 1.769  Thyroxine (T4) 4.5 - 12.0 ug/dL 9.0    Latest Reference Range & Units 04/03/24 08:45  CEA 0.0 - 4.7 ng/mL 8.2 (H)  (H): Data is abnormally high  RADIOGRAPHIC STUDIES: I have personally reviewed the radiological images as listed and agreed with the findings in the report.  CT CHEST ABDOMEN PELVIS W CONTRAST CLINICAL DATA:  Metastatic colon cancer, assess treatment response * Tracking Code: BO *  EXAM: CT CHEST, ABDOMEN, AND PELVIS WITH CONTRAST  TECHNIQUE: Multidetector CT imaging of the chest, abdomen and pelvis was performed following the standard protocol during bolus administration of intravenous contrast.  RADIATION DOSE REDUCTION: This exam was performed according to the departmental dose-optimization program which includes automated exposure control, adjustment of the mA and/or kV according to patient size and/or use of iterative reconstruction technique.  CONTRAST:  80mL OMNIPAQUE  IOHEXOL  350 MG/ML SOLN additional  oral enteric contrast  COMPARISON:  02/07/2024  FINDINGS: CT CHEST FINDINGS  Cardiovascular: No significant vascular findings. Normal heart size. No pericardial effusion.  Mediastinum/Nodes: Similar size of enlarged pretracheal lymph node measuring 1.2 x 1.1 cm (series 2, image 23). Similar size of left supraclavicular lymph nodes measuring up to 1.8 x 1.0 cm (series 2, image 70). Thyroid  gland, trachea, and esophagus demonstrate no significant findings.  Lungs/Pleura: Slight enlargement of a nodule of the anterior right pulmonary apex measuring 1.5 x 1.2 cm, previously 1.2 x 1.0 cm (series 4, image 21). Additional small bilateral pulmonary nodules unchanged, largest in the right lower lobe measuring 0.6 cm (series 4, image 89). Bandlike scarring of the left lung base. No pleural effusion or pneumothorax.  Musculoskeletal: Pectus deformity.  No acute osseous findings.  CT ABDOMEN PELVIS FINDINGS  Hepatobiliary: No significant change in an elongated residual of a treated lesion of the anterior left lobe of the liver measuring 1.0 x 0.3 cm (series 2, image 61). No gallstones, gallbladder wall thickening, or biliary dilatation.  Pancreas: Unremarkable. No pancreatic ductal dilatation or surrounding inflammatory changes.  Spleen: Normal in size without significant abnormality.  Adrenals/Urinary Tract: Adrenal glands are unremarkable.  Kidneys are normal, without renal calculi, solid lesion, or hydronephrosis. Bladder is unremarkable.  Stomach/Bowel: Stomach is within normal limits. Right hemicolectomy and ileocolic anastomosis. Minimal enlargement of a soft tissue mass in the central small bowel mesentery with central calcification measuring 4.5 x 3.2 cm, previously 4.0 x 2.9 cm (series 2, image 74). No evidence of bowel wall thickening, distention, or inflammatory changes.  Vascular/Lymphatic: No significant vascular findings are present. Unchanged treated subcentimeter  retroperitoneal lymph nodes (series 2, image 67).  Reproductive: No mass or other abnormality.  Other: No abdominal wall hernia or abnormality. No ascites.  Musculoskeletal: No acute osseous findings.  IMPRESSION: 1. Slight enlargement of a nodule of the anterior right pulmonary apex measuring 1.5 x 1.2 cm, previously 1.2 x 1.0 cm. Additional small bilateral pulmonary nodules unchanged. 2. Similar size of enlarged pretracheal pretracheal and left supraclavicular lymph nodes. 3. Minimal enlargement of a soft tissue mass in the central small bowel mesentery with central calcification measuring 4.5 x 3.2 cm, previously 4.0 x 2.9 cm. 4. No significant change in an elongated residual of a treated lesion of the anterior left lobe of the liver. 5. Unchanged treated subcentimeter retroperitoneal lymph nodes. 6. Constellation of findings is consistent with minimal interval progression of metastatic disease.  Electronically Signed   By: Marolyn JONETTA Jaksch M.D.   On: 05/08/2024 12:51

## 2024-06-26 NOTE — Progress Notes (Signed)
Treatment given per orders. Patient tolerated it well without problems. Vitals stable and discharged home from clinic ambulatory. Follow up as scheduled.  

## 2024-06-26 NOTE — Progress Notes (Unsigned)
 Patient has been examined by Dr. Davonna. Vital signs and labs have been reviewed by MD - ANC, Creatinine, LFTs, hemoglobin, and platelets have been reviewed by M.D. - pt may proceed with treatment.  Primary RN and pharmacy notified.

## 2024-06-27 ENCOUNTER — Ambulatory Visit: Admitting: Radiation Oncology

## 2024-06-27 LAB — CEA: CEA: 8.7 ng/mL — ABNORMAL HIGH (ref 0.0–4.7)

## 2024-06-27 LAB — T4: T4, Total: 8.7 ug/dL (ref 4.5–12.0)

## 2024-07-07 ENCOUNTER — Encounter: Payer: Self-pay | Admitting: *Deleted

## 2024-07-21 ENCOUNTER — Encounter: Payer: Self-pay | Admitting: Oncology

## 2024-07-28 ENCOUNTER — Encounter: Payer: Self-pay | Admitting: Oncology

## 2024-07-31 ENCOUNTER — Inpatient Hospital Stay: Admitting: Oncology

## 2024-07-31 ENCOUNTER — Ambulatory Visit (HOSPITAL_COMMUNITY)
Admission: RE | Admit: 2024-07-31 | Discharge: 2024-07-31 | Disposition: A | Discharge status: Home / Self Care | Source: Ambulatory Visit | Attending: Oncology | Admitting: Oncology

## 2024-07-31 ENCOUNTER — Inpatient Hospital Stay

## 2024-07-31 ENCOUNTER — Other Ambulatory Visit: Payer: Self-pay | Admitting: Oncology

## 2024-07-31 DIAGNOSIS — C787 Secondary malignant neoplasm of liver and intrahepatic bile duct: Secondary | ICD-10-CM | POA: Diagnosis present

## 2024-07-31 DIAGNOSIS — Z95828 Presence of other vascular implants and grafts: Secondary | ICD-10-CM

## 2024-07-31 DIAGNOSIS — C189 Malignant neoplasm of colon, unspecified: Secondary | ICD-10-CM | POA: Diagnosis present

## 2024-07-31 MED ORDER — IOHEXOL 9 MG/ML PO SOLN
ORAL | Status: AC
  Filled 2024-07-31: qty 1000

## 2024-07-31 MED ORDER — IOHEXOL 300 MG/ML  SOLN
100.0000 mL | Freq: Once | INTRAMUSCULAR | Status: AC | PRN
  Administered 2024-07-31: 100 mL via INTRAVENOUS

## 2024-08-05 ENCOUNTER — Other Ambulatory Visit: Payer: Self-pay

## 2024-08-07 ENCOUNTER — Inpatient Hospital Stay: Attending: Hematology | Admitting: Oncology

## 2024-08-07 ENCOUNTER — Inpatient Hospital Stay

## 2024-08-07 VITALS — BP 142/57 | HR 57 | Resp 18

## 2024-08-07 DIAGNOSIS — C184 Malignant neoplasm of transverse colon: Secondary | ICD-10-CM | POA: Insufficient documentation

## 2024-08-07 DIAGNOSIS — R21 Rash and other nonspecific skin eruption: Secondary | ICD-10-CM

## 2024-08-07 DIAGNOSIS — C189 Malignant neoplasm of colon, unspecified: Secondary | ICD-10-CM

## 2024-08-07 DIAGNOSIS — Z79620 Long term (current) use of immunosuppressive biologic: Secondary | ICD-10-CM | POA: Diagnosis not present

## 2024-08-07 DIAGNOSIS — K529 Noninfective gastroenteritis and colitis, unspecified: Secondary | ICD-10-CM | POA: Diagnosis not present

## 2024-08-07 DIAGNOSIS — C787 Secondary malignant neoplasm of liver and intrahepatic bile duct: Secondary | ICD-10-CM | POA: Diagnosis present

## 2024-08-07 DIAGNOSIS — Z5112 Encounter for antineoplastic immunotherapy: Secondary | ICD-10-CM | POA: Diagnosis present

## 2024-08-07 DIAGNOSIS — Z95828 Presence of other vascular implants and grafts: Secondary | ICD-10-CM

## 2024-08-07 LAB — CBC WITH DIFFERENTIAL/PLATELET
Abs Immature Granulocytes: 0.01 10*3/uL (ref 0.00–0.07)
Basophils Absolute: 0.1 10*3/uL (ref 0.0–0.1)
Basophils Relative: 1 %
Eosinophils Absolute: 0.2 10*3/uL (ref 0.0–0.5)
Eosinophils Relative: 3 %
HCT: 38.5 % (ref 36.0–46.0)
Hemoglobin: 12.5 g/dL (ref 12.0–15.0)
Immature Granulocytes: 0 %
Lymphocytes Relative: 17 %
Lymphs Abs: 1 10*3/uL (ref 0.7–4.0)
MCH: 30.1 pg (ref 26.0–34.0)
MCHC: 32.5 g/dL (ref 30.0–36.0)
MCV: 92.8 fL (ref 80.0–100.0)
Monocytes Absolute: 0.9 10*3/uL (ref 0.1–1.0)
Monocytes Relative: 14 %
Neutro Abs: 4 10*3/uL (ref 1.7–7.7)
Neutrophils Relative %: 65 %
Platelets: 169 10*3/uL (ref 150–400)
RBC: 4.15 MIL/uL (ref 3.87–5.11)
RDW: 13.3 % (ref 11.5–15.5)
WBC: 6.1 10*3/uL (ref 4.0–10.5)
nRBC: 0 % (ref 0.0–0.2)

## 2024-08-07 LAB — COMPREHENSIVE METABOLIC PANEL WITH GFR
ALT: 19 U/L (ref 0–44)
AST: 26 U/L (ref 15–41)
Albumin: 4.3 g/dL (ref 3.5–5.0)
Alkaline Phosphatase: 73 U/L (ref 38–126)
Anion gap: 13 (ref 5–15)
BUN: 15 mg/dL (ref 8–23)
CO2: 23 mmol/L (ref 22–32)
Calcium: 9.6 mg/dL (ref 8.9–10.3)
Chloride: 102 mmol/L (ref 98–111)
Creatinine, Ser: 0.58 mg/dL (ref 0.44–1.00)
GFR, Estimated: >60 mL/min
Glucose, Bld: 89 mg/dL (ref 70–99)
Potassium: 4 mmol/L (ref 3.5–5.1)
Sodium: 138 mmol/L (ref 135–145)
Total Bilirubin: 0.3 mg/dL (ref 0.0–1.2)
Total Protein: 7.6 g/dL (ref 6.5–8.1)

## 2024-08-07 LAB — TSH: TSH: 2.19 u[IU]/mL (ref 0.350–4.500)

## 2024-08-07 LAB — MAGNESIUM: Magnesium: 2.3 mg/dL (ref 1.7–2.4)

## 2024-08-07 MED ORDER — SODIUM CHLORIDE 0.9 % IV SOLN
400.0000 mg | Freq: Once | INTRAVENOUS | Status: AC
  Administered 2024-08-07: 400 mg via INTRAVENOUS
  Filled 2024-08-07: qty 16

## 2024-08-07 MED ORDER — SODIUM CHLORIDE 0.9 % IV SOLN: Freq: Once | INTRAVENOUS | Status: AC

## 2024-08-07 MED ORDER — TRIAMCINOLONE ACETONIDE 0.1 % EX CREA: 1.0000 | TOPICAL_CREAM | Freq: Two times a day (BID) | CUTANEOUS | 2 refills | Status: AC

## 2024-08-07 NOTE — Progress Notes (Signed)
 Patient has been examined by Dr. Davonna. Vital signs and labs have been reviewed by MD - ANC, Creatinine, LFTs, hemoglobin, and platelets have been reviewed by M.D. - pt may proceed with treatment.  Primary RN and pharmacy notified.

## 2024-08-07 NOTE — Progress Notes (Addendum)
 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 female with metastatic colon carcinoma  Metastatic colon cancer to liver Metastatic colon carcinoma, metastatic to liver, lung.  Initially diagnosed in 2015 Extensive oncology history below History patient has a TMB high, currently is on Keytruda  Caris NGS below, also showed BRAF V 600E mutation   - Labs reviewed today: CMP: WNL, CBC: WNL, TSH: Normal, CEA: pending -We reviewed the CT scan findings together. Patient has no new disease. Stable findings.  - Physical exam stable today.  Proceed with treatment today.  Tolerating well with no complications.  -At progression can consider other treatment options like changing to either chemotherapy or BRAF inhibitors. Patient previously did not tolerate chemotherapy well. - Will continue Keytruda  every 6 weeks at this time. -Repeat imaging every 3 months - Repeat CEA with every cycle.  CEA from today pending.   Return to clinic in 12 weeks for follow up and review CT scan  Chronic diarrhea She has baseline diarrhea since surgery.   - Continue Imodium  as needed.  She is averaging 1-2 Imodium  tablets daily.  Immune checkpoint inhibitor-induced dermatitis Rash with xerosis and macular lesions. Partial relief with emollients.  - Prescribed hydrocortisone  cream for affected areas. - Recommended continued use of moisturizing lotion. - Advised application of topical triamcinolone .   The patient understands the plans discussed today and is in agreement with them.  She knows to contact our office if she develops concerns prior to her next  appointment.  The total time spent in the appointment was 20 minutes for the encounter with patient, including review of chart and various tests results, discussions about plan of care and coordination of care plan   Mickiel Dry, MD  [facility] [place] - A DEPT OF [facility] [address] [place] [address] Dept: [phone] Dept Fax: [phone]   No orders of the defined types were placed in this encounter.    ONCOLOGY HISTORY:   I have reviewed her chart and materials related to her cancer extensively and collaborated history with the patient. Summary of oncologic history is as follows:   Diagnosis: Metastatic adenocarcinoma the proximal transverse colon, BRAF V600E+, TMB high   -CT AP: 12/02/2013: CT AP: Area concerning for neoplasm in the proximal transverse colon. In this area, there is focal wall thickening with what appears to be some mucosal disruption. This area measures 6.2 x 4.3 cm in length. There is some subtle mesenteric thickening in this area; spread of tumor to the immediate adjacent mesenteric region is suspected. Small nodular lesions in the lung bases. Questionable mass versus inflammation in the head of the pancreas.  -12/05/2013: MRI abdomen: 18 x 9 mm cystic lesion in the posterior pancreatic head appears to communicate with the main pancreatic duct. Side branch IPMN would be a distinct consideration. Immediately anterior to this nonenhancing cystic lesion is a 10 x 23 mm subtle area of altered signal and hypo enhancement within the pancreatic head.  -12/11/2013: CEA 3.8.  -12/12/2013: Colonoscopy: Proximal transverse colon mass, biopsied.  Pathology: Invasive adenocarcinoma.  -12/29/2013: Right hemicolectomy. Pathology: Grade  2 invasive colorectal adenocarcinoma, 7 cm, extending into pericolonic tissue. Metastatic carcinoma in 6 of 33 lymph nodes. Margins not involved. pT3, pN2a,  MX -12/31/2013: CT chest: Multiple small bilateral pulmonary nodules may represent benign granulomatous disease. Perihepatic ascites and some free intraperitoneal gas, consistent with recent surgical history.  -02/10/2014-03/10/2014: 3 cycles of FOLFOX completed, discontinued secondary to intolerance -03/10/2014: DPD gene mutation negative -12/10/2014: CT AP: Status post right hemicolectomy with 2 new adjacent soft tissue nodules in the small bowel mesenteric worrisome for recurrent disease. -02/01/2015: Initial PET: Mild abdominal mesenteric lymphadenopathy shows hypermetabolic activity, consistent with metastatic disease. No other definite sites of metastatic disease identified. 7 mm indeterminate pulmonary nodule in the superior segment of the right lower lobe shows no associated metabolic activity, but remains too small to accurately characterize by PET.  -03/24/2015: PET: Mild progression of nodal metastases in the anterior mid abdominal mesentery. New nodal metastasis at the left thoracic inlet. -06/22/2015: Resolution of metabolic activity and decreased size of mild lymphadenopathy at left thoracic inlet. Stable mild mid abdominal hypermetabolic mesenteric lymphadenopathy, consistent with metastatic disease. Stable 6 mm posterior left lower lobe pulmonary nodule, without associated metabolic activity. No new or progressive disease identified. -2017-09/20/2020: Patient was under surveillance with imaging showing stable disease. -03/28/2018: Colonoscopy: One 8 mm polyp in the transverse colon, resected. Diverticulosis in the sigmoid colon, descending colon, splenic flexure, and transverse colon.  Pathology: Sessile serrated polyp without cytologic dysplasia.  -10/28/2018: CEA: 4.6 -09/17/2020: CEA: 21.4 -09/21/2020: CT AP: A soft tissue nodule within the central mesentery in the ventral abdomen has substantially increased in size, now encasing the proximal superior mesenteric artery and measuring  5.0 x 4.3 cm, previously 1.9 x 1.7 cm. Numerous new small pulmonary nodules throughout the included lung bases, measuring up to 5 mm. There is a new hypodense lesion of the anterior right lobe of the liver measuring 1.4 x 1.0 cm. Numerous newly enlarged retrocrural and periaortic lymph nodes in the included lower chest as well as numerous newly enlarged retroperitoneal lymph nodes. -09/27/2020: CT chest: Findings of metastatic disease in the chest with LEFT thoracic inlet lymph node, mediastinal lymph nodes and multiple pulmonary nodules in addition to abdominal findings which were outlined in the recent comparison study. -09/28/2020: Mesenteric lymph node biopsy.   Pathology: Mucin and fibrosis  -10/14/2020: Left supraclavicular lymph node biopsy.  Pathology: Soft tissue, mucin, and rare fragments of atypical columnar epithelium. Findings likely represent metastatic colorectal adenocarcinoma. There is likely  insufficient material for additional studies.  -11/09/2020: Caris: BRAF V600 E+, MMR deficient, MSI high, TMB high,28mut/Mb HER2 negative, BRCA1/2 pathogenic variant on exon 14 and exon 9 respectively.  The report also suggested decreased benefit to FOLFOX and bevacizumab  in the first-line metastatic setting.  -11/09/2020: Liver biopsy:  Pathology: Adenocarcinoma with abundant mucin consistent with metastatic mucinous adenocarcinoma. Tumor cells are positive with CDX2 and cytokeratin 20. -11/10/2020-12/08/2020: 3 cycles of FOLFIRI and reduced bevacizumab  completed -01/13/2021-current: Keytruda , transitioned to every 6 weeks on 03/22/2023 -2022-01/2024: Patient's imaging has showed stable to improved metastatic disease - 05/06/2024: CT CAP: Slight enlargement of a nodule of the anterior right pulmonary apex measuring 1.5 x 1.2 cm, previously 1.2 x 1.0 cm. Additional small bilateral pulmonary nodules unchanged. Similar size of enlarged pretracheal pretracheal and left supraclavicular lymph nodes.  Minimal enlargement of a soft tissue mass in the central small bowel mesentery with central calcification measuring 4.5 x 3.2 cm, previously 4.0 x 2.9 cm. No significant change in an elongated residual of a  treated lesion of the anterior left lobe of the liver. Unchanged treated subcentimeter retroperitoneal lymph nodes. -06/11/2024 - 06/20/2024: SBRT to right lung by Dr. Dewey -07/31/2024: CT CAP: Right hemicolectomy. No significant change in a centrally calcified soft tissue mass in the central small bowel mesentery.No significant change in size of a nodule in the anterior right pulmonary apex measuring 1.5 x 1.1 cm. There is however new adjacent ground-glass opacity, which may reflect radiation pneumonitis if there has been local radiation therapy. Multiple additional small pulmonary nodules not significantly changed.Unchanged enlarged pretracheal and left supraclavicular lymph nodes as well as treated retroperitoneal lymph nodes.Unchanged treated residual of a lesion in the anterior left lobe of the liver.   Current Treatment:  Keytruda  400mg  every 6 weeks  INTERVAL HISTORY:   Discussed the use of AI scribe software for clinical note transcription with the patient, who gave verbal consent to proceed.  History of Present Illness COURTENAY HIRTH is a 79 year old female with lung cancer receiving pembrolizumab  who presents for follow up and keytruda  infusion. She is accompanied by her husband today.   She is currently undergoing immunotherapy with pembrolizumab  for colon cancer following prior radiation therapy. Recent imaging shows a persistent pulmonary lesion and some changes around the area after radiation. Laboratory studies are within normal limits, and cancer marker results are pending.  She reports a rash with dry skin and red spots, some of which have resolved but left residual redness. She applies Sierra B lotion with partial symptomatic relief and has been advised to use hydrocortisone  and  topical corticosteroids. She does not recall the rash being present at her previous visit.  She experiences mild diarrhea, managed with approximately one tablet of loperamide  daily. She denies dyspnea or other acute symptoms.    I have reviewed the past medical history, past surgical history, social history and family history with the patient and they are unchanged from previous note.  ALLERGIES:  is allergic to sulfa antibiotics.  MEDICATIONS:  Current Outpatient Medications  Medication Sig Dispense Refill   acetaminophen  (TYLENOL ) 500 MG tablet Take 500 mg by mouth every 6 (six) hours as needed for moderate pain or headache.     aspirin 81 MG EC tablet Take 1 tablet (81 mg total) by mouth daily. Swallow whole. 30 tablet 12   Bioflavonoid Products (ESTER C PO) Take 1,000 mg by mouth daily.     Cholecalciferol (D3-1000) 25 MCG (1000 UT) capsule Take 125 Units by mouth daily.     Cranberry-Vitamin C-Vitamin E 4200-20-3 MG-MG-UNIT CAPS Take 500 mg by mouth daily.     Garlic 1000 MG CAPS Take 1,000 mg by mouth daily.      lidocaine -prilocaine  (EMLA ) cream Apply small amount to port a cath site and cover with plastic wrap 1 hour prior to infusion appointments 30 g 3   Multiple Vitamin (MULTIVITAMIN) tablet Take 1 tablet by mouth daily.     OVER THE COUNTER MEDICATION Take 1,000 mg by mouth daily. Moringa 1000 mg daily     zinc gluconate 50 MG tablet Take 50 mg by mouth daily.     No current facility-administered medications for this visit.   Facility-Administered Medications Ordered in Other Visits  Medication Dose Route Frequency Provider Last Rate Last Admin   sodium chloride  flush (NS) 0.9 % injection 10 mL  10 mL Intracatheter PRN Rogers Hai, MD   10 mL at 01/10/24 1434    VITALS:  There were no vitals taken for this visit.  Wt Readings  from Last 3 Encounters:  08/07/24 159 lb 2.8 oz (72.2 kg)  06/26/24 156 lb 12 oz (71.1 kg)  05/22/24 156 lb (70.8 kg)    There is no  height or weight on file to calculate BMI.  Performance status (ECOG): 2 - Symptomatic, <50% confined to bed  PHYSICAL EXAM:   GENERAL:alert, no distress and comfortable SKIN: Erythematous circular rash in the right side of the face (picture below) LYMPH:  no palpable lymphadenopathy in the cervical, axillary or inguinal LUNGS: No abnormal breath sounds.  Normal breathing effort.  HEART: regular rate & rhythm and no murmurs and no lower extremity edema ABDOMEN:abdomen soft, non-tender and normal bowel sounds Musculoskeletal:no cyanosis of digits and no clubbing  NEURO: alert & oriented x 3 with fluent speech   LABORATORY DATA:  I have reviewed the data as listed  Lab Results  Component Value Date   WBC 6.1 08/07/2024   NEUTROABS 4.0 08/07/2024   HGB 12.5 08/07/2024   HCT 38.5 08/07/2024   MCV 92.8 08/07/2024   PLT 169 08/07/2024      Chemistry      Component Value Date/Time   NA 138 08/07/2024 1211   K 4.0 08/07/2024 1211   CL 102 08/07/2024 1211   CO2 23 08/07/2024 1211   BUN 15 08/07/2024 1211   CREATININE 0.58 08/07/2024 1211   CREATININE 0.68 08/26/2020 0944      Component Value Date/Time   CALCIUM  9.6 08/07/2024 1211   ALKPHOS 73 08/07/2024 1211   AST 26 08/07/2024 1211   ALT 19 08/07/2024 1211   BILITOT 0.3 08/07/2024 1211      Latest Reference Range & Units 06/26/24 12:13  TSH 0.350 - 4.500 uIU/mL 1.170    Latest Reference Range & Units 04/03/24 08:45  CEA 0.0 - 4.7 ng/mL 8.2 (H)  (H): Data is abnormally high  RADIOGRAPHIC STUDIES: I have personally reviewed the radiological images as listed and agreed with the findings in the report.  CT CHEST ABDOMEN PELVIS W CONTRAST CLINICAL DATA:  Metastatic colon cancer restaging * Tracking Code: BO *  EXAM: CT CHEST, ABDOMEN, AND PELVIS WITH CONTRAST  TECHNIQUE: Multidetector CT imaging of the chest, abdomen and pelvis was performed following the standard protocol during bolus administration of  intravenous contrast.  RADIATION DOSE REDUCTION: This exam was performed according to the departmental dose-optimization program which includes automated exposure control, adjustment of the mA and/or kV according to patient size and/or use of iterative reconstruction technique.  CONTRAST:  OMNIPAQUE  IOHEXOL  300 MG/ML SOLN additional oral enteric contrast  COMPARISON:  05/06/2024  FINDINGS: CT CHEST FINDINGS  Cardiovascular: Right chest port catheter. Cardiomegaly. No pericardial effusion.  Mediastinum/Nodes: Unchanged enlarged pretracheal nodes measuring up to 1.1 x 1.0 cm (series 2, image 26). Unchanged enlarged left supraclavicular nodes measuring up to 1.7 x 0.8 cm (series 2, image 111). Thyroid  gland, trachea, and esophagus demonstrate no significant findings.  Lungs/Pleura: No significant change in size of a nodule in the anterior right pulmonary apex measuring 1.5 x 1.1 cm (series 5, image 27). There is however new adjacent ground-glass opacity. Adjacent biapical pleural-parenchymal scarring is unchanged. Multiple additional small pulmonary nodules not significantly changed, largest a 0.6 cm nodule in the right lower lobe (series 5, image 93). No pleural effusion or pneumothorax.  Musculoskeletal: Pectus deformity.  No acute osseous findings.  CT ABDOMEN PELVIS FINDINGS  Hepatobiliary: Unchanged treated residual of a lesion in the anterior left lobe of the liver measuring 0.9 x 0.3 cm (  series 2, image 62). No gallstones, gallbladder wall thickening, or biliary dilatation.  Pancreas: Unremarkable. No pancreatic ductal dilatation or surrounding inflammatory changes.  Spleen: Normal in size without significant abnormality.  Adrenals/Urinary Tract: Adrenal glands are unremarkable. Kidneys are normal, without renal calculi, solid lesion, or hydronephrosis. Bladder is unremarkable.  Stomach/Bowel: Stomach is within normal limits. Right hemicolectomy and  ileocolic anastomosis. No significant change in a centrally calcified soft tissue mass in the central small bowel mesentery measuring 4.5 x 3.3 cm (series 2, image 76). No evidence of bowel wall thickening, distention, or inflammatory changes.  Vascular/Lymphatic: No significant vascular findings are present. Unchanged treated retroperitoneal lymph nodes (series 2, image 70).  Reproductive: No mass or other abnormality.  Other: No abdominal wall hernia or abnormality. No ascites.  Musculoskeletal: No acute osseous findings.  IMPRESSION: 1. Right hemicolectomy. No significant change in a centrally calcified soft tissue mass in the central small bowel mesentery. 2. No significant change in size of a nodule in the anterior right pulmonary apex measuring 1.5 x 1.1 cm. There is however new adjacent ground-glass opacity, which may reflect radiation pneumonitis if there has been local radiation therapy. 3. Multiple additional small pulmonary nodules not significantly changed. 4. Unchanged enlarged pretracheal and left supraclavicular lymph nodes as well as treated retroperitoneal lymph nodes. 5. Unchanged treated residual of a lesion in the anterior left lobe of the liver.  Electronically Signed   By: Marolyn JONETTA Jaksch M.D.   On: 08/02/2024 07:06    "

## 2024-08-07 NOTE — Progress Notes (Signed)
 Labs reviewed with MD today. Ok to treat today per MD.    Treatment given per orders. Patient tolerated it well without problems. Vitals stable and discharged home from clinic ambulatory. Follow up as scheduled.

## 2024-08-07 NOTE — Patient Instructions (Signed)
 [facility] at [facility] Discharge Instructions   You were seen and examined today by Dr. Davonna.  She reviewed the results of your lab work which are normal/stable.   She reviewed the results of your CT scan which is stable. The areas that were radiated in the lung show post radiation changes which is normal. We will repeat a CT scan in 3 months.   We will proceed with your treatment today.   Return as scheduled.    Thank you for choosing [facility] at [facility] to provide your oncology and hematology care.  To afford each patient quality time with our provider, please arrive at least 15 minutes before your scheduled appointment time.   If you have a lab appointment with the Cancer Center please come in thru the Main Entrance and check in at the main information desk.  You need to re-schedule your appointment should you arrive 10 or more minutes late.  We strive to give you quality time with our providers, and arriving late affects you and other patients whose appointments are after yours.  Also, if you no show three or more times for appointments you may be dismissed from the clinic at the providers discretion.     Again, thank you for choosing [facility].  Our hope is that these requests will decrease the amount of time that you wait before being seen by our physicians.       _____________________________________________________________  Should you have questions after your visit to [facility], please contact our office at [phone] and follow the prompts.  Our office hours are 8:00 a.m. and 4:30 p.m. Monday - Friday.  Please note that voicemails left after 4:00 p.m. may not be returned until the following business day.  We are closed weekends and major holidays.  You do have access to a nurse 24-7, just call the main number to the clinic [phone] and do not press any options, hold on the line and a nurse  will answer the phone.    For prescription refill requests, have your pharmacy contact our office and allow 72 hours.    Due to Covid, you will need to wear a mask upon entering the hospital. If you do not have a mask, a mask will be given to you at the Main Entrance upon arrival. For doctor visits, patients may have 1 support person age 38 or older with them. For treatment visits, patients can not have anyone with them due to social distancing guidelines and our immunocompromised population.

## 2024-08-07 NOTE — Patient Instructions (Signed)
 [facility] [place] - A DEPT OF [facility]  Discharge Instructions: Thank you for choosing [facility] to provide your oncology and hematology care.  If you have a lab appointment with the Cancer Center - please note that after April 8th, 2024, all labs will be drawn in the cancer center.  You do not have to check in or register with the main entrance as you have in the past but will complete your check-in in the cancer center.  Wear comfortable clothing and clothing appropriate for easy access to any Portacath or PICC line.   We strive to give you quality time with your provider. You may need to reschedule your appointment if you arrive late (15 or more minutes).  Arriving late affects you and other patients whose appointments are after yours.  Also, if you miss three or more appointments without notifying the office, you may be dismissed from the clinic at the provider's discretion.      For prescription refill requests, have your pharmacy contact our office and allow 72 hours for refills to be completed.    Today you received the following chemotherapy and/or immunotherapy agents Keytruda      To help prevent nausea and vomiting after your treatment, we encourage you to take your nausea medication as directed.  BELOW ARE SYMPTOMS THAT SHOULD BE REPORTED IMMEDIATELY: *FEVER GREATER THAN 100.4 F (38 C) OR HIGHER *CHILLS OR SWEATING *NAUSEA AND VOMITING THAT IS NOT CONTROLLED WITH YOUR NAUSEA MEDICATION *UNUSUAL SHORTNESS OF BREATH *UNUSUAL BRUISING OR BLEEDING *URINARY PROBLEMS (pain or burning when urinating, or frequent urination) *BOWEL PROBLEMS (unusual diarrhea, constipation, pain near the anus) TENDERNESS IN MOUTH AND THROAT WITH OR WITHOUT PRESENCE OF ULCERS (sore throat, sores in mouth, or a toothache) UNUSUAL RASH, SWELLING OR PAIN  UNUSUAL VAGINAL DISCHARGE OR ITCHING   Items with * indicate a potential emergency and should be followed up  as soon as possible or go to the Emergency Department if any problems should occur.  Please show the CHEMOTHERAPY ALERT CARD or IMMUNOTHERAPY ALERT CARD at check-in to the Emergency Department and triage nurse.  Should you have questions after your visit or need to cancel or reschedule your appointment, please contact [facility]  - A DEPT OF [facility] [phone]  and follow the prompts.  Office hours are 8:00 a.m. to 4:30 p.m. Monday - Friday. Please note that voicemails left after 4:00 p.m. may not be returned until the following business day.  We are closed weekends and major holidays. You have access to a nurse at all times for urgent questions. Please call the main number to the clinic [phone] and follow the prompts.  For any non-urgent questions, you may also contact your provider using MyChart. We now offer e-Visits for anyone 30 and older to request care online for non-urgent symptoms. For details visit [email or web address].   Also download the MyChart app! Go to the app store, search "MyChart", open the app, select [place], and log in with your MyChart username and password.

## 2024-08-08 ENCOUNTER — Other Ambulatory Visit: Payer: Self-pay

## 2024-08-08 LAB — T4: T4, Total: 8.6 ug/dL (ref 4.5–12.0)

## 2024-08-08 LAB — CEA: CEA: 9.5 ng/mL — ABNORMAL HIGH (ref 0.0–4.7)

## 2024-08-10 ENCOUNTER — Other Ambulatory Visit: Payer: Self-pay

## 2024-09-18 ENCOUNTER — Inpatient Hospital Stay

## 2024-09-18 ENCOUNTER — Inpatient Hospital Stay: Attending: Hematology

## 2024-10-23 ENCOUNTER — Other Ambulatory Visit (HOSPITAL_COMMUNITY)

## 2024-10-30 ENCOUNTER — Inpatient Hospital Stay: Admitting: Oncology

## 2024-10-30 ENCOUNTER — Inpatient Hospital Stay
